# Patient Record
Sex: Female | Born: 1935
Health system: Southern US, Community
[De-identification: ages and names within clinical notes are randomized; demographics above are authoritative.]

## PROBLEM LIST (undated history)

## (undated) DIAGNOSIS — I10 Essential (primary) hypertension: Secondary | ICD-10-CM

## (undated) DIAGNOSIS — Z8489 Family history of other specified conditions: Secondary | ICD-10-CM

## (undated) DIAGNOSIS — M1990 Unspecified osteoarthritis, unspecified site: Secondary | ICD-10-CM

## (undated) DIAGNOSIS — T7840XA Allergy, unspecified, initial encounter: Secondary | ICD-10-CM

## (undated) DIAGNOSIS — F419 Anxiety disorder, unspecified: Secondary | ICD-10-CM

## (undated) DIAGNOSIS — E039 Hypothyroidism, unspecified: Secondary | ICD-10-CM

## (undated) DIAGNOSIS — Z9889 Other specified postprocedural states: Secondary | ICD-10-CM

## (undated) DIAGNOSIS — E079 Disorder of thyroid, unspecified: Secondary | ICD-10-CM

## (undated) DIAGNOSIS — R112 Nausea with vomiting, unspecified: Secondary | ICD-10-CM

## (undated) DIAGNOSIS — K219 Gastro-esophageal reflux disease without esophagitis: Secondary | ICD-10-CM

## (undated) DIAGNOSIS — Z87898 Personal history of other specified conditions: Secondary | ICD-10-CM

## (undated) DIAGNOSIS — L0390 Cellulitis, unspecified: Secondary | ICD-10-CM

## (undated) DIAGNOSIS — E785 Hyperlipidemia, unspecified: Secondary | ICD-10-CM

## (undated) HISTORY — PX: CATARACT EXTRACTION W/ INTRAOCULAR LENS  IMPLANT, BILATERAL: SHX1307

## (undated) HISTORY — DX: Cellulitis, unspecified: L03.90

## (undated) HISTORY — DX: Gastro-esophageal reflux disease without esophagitis: K21.9

## (undated) HISTORY — PX: THYROIDECTOMY, PARTIAL: SHX18

## (undated) HISTORY — DX: Anxiety disorder, unspecified: F41.9

## (undated) HISTORY — DX: Hyperlipidemia, unspecified: E78.5

## (undated) HISTORY — DX: Unspecified osteoarthritis, unspecified site: M19.90

## (undated) HISTORY — PX: OTHER SURGICAL HISTORY: SHX169

## (undated) HISTORY — PX: TONSILLECTOMY AND ADENOIDECTOMY: SUR1326

## (undated) HISTORY — DX: Essential (primary) hypertension: I10

## (undated) HISTORY — DX: Allergy, unspecified, initial encounter: T78.40XA

## (undated) HISTORY — PX: JOINT REPLACEMENT: SHX530

## (undated) HISTORY — PX: SPINE SURGERY: SHX786

## (undated) HISTORY — DX: Disorder of thyroid, unspecified: E07.9

## (undated) HISTORY — PX: COLONOSCOPY: SHX174

---

## 1987-12-10 HISTORY — PX: OTHER SURGICAL HISTORY: SHX169

## 1998-10-14 ENCOUNTER — Emergency Department (HOSPITAL_COMMUNITY): Admission: EM | Admit: 1998-10-14 | Discharge: 1998-10-14 | Payer: Self-pay | Admitting: Internal Medicine

## 2000-02-11 ENCOUNTER — Encounter: Payer: Self-pay | Admitting: Specialist

## 2000-02-18 ENCOUNTER — Inpatient Hospital Stay (HOSPITAL_COMMUNITY): Admission: RE | Admit: 2000-02-18 | Discharge: 2000-02-25 | Payer: Self-pay | Admitting: Specialist

## 2000-02-18 ENCOUNTER — Encounter: Payer: Self-pay | Admitting: Specialist

## 2000-02-22 ENCOUNTER — Encounter: Payer: Self-pay | Admitting: Specialist

## 2000-05-28 ENCOUNTER — Encounter: Admission: RE | Admit: 2000-05-28 | Discharge: 2000-08-26 | Payer: Self-pay | Admitting: Psychiatry

## 2004-10-23 ENCOUNTER — Ambulatory Visit: Payer: Self-pay | Admitting: Internal Medicine

## 2005-01-30 ENCOUNTER — Ambulatory Visit: Payer: Self-pay | Admitting: Internal Medicine

## 2005-08-12 ENCOUNTER — Emergency Department (HOSPITAL_COMMUNITY): Admission: EM | Admit: 2005-08-12 | Discharge: 2005-08-12 | Payer: Self-pay | Admitting: Emergency Medicine

## 2005-08-13 ENCOUNTER — Encounter: Admission: RE | Admit: 2005-08-13 | Discharge: 2005-08-13 | Payer: Self-pay | Admitting: Internal Medicine

## 2005-08-13 ENCOUNTER — Ambulatory Visit: Payer: Self-pay | Admitting: Internal Medicine

## 2005-08-15 ENCOUNTER — Encounter: Admission: RE | Admit: 2005-08-15 | Discharge: 2005-08-15 | Payer: Self-pay | Admitting: Internal Medicine

## 2005-08-15 ENCOUNTER — Ambulatory Visit: Payer: Self-pay | Admitting: Internal Medicine

## 2005-09-20 ENCOUNTER — Ambulatory Visit: Payer: Self-pay | Admitting: Internal Medicine

## 2005-10-23 ENCOUNTER — Ambulatory Visit: Payer: Self-pay | Admitting: Internal Medicine

## 2005-11-13 ENCOUNTER — Ambulatory Visit: Payer: Self-pay | Admitting: Internal Medicine

## 2006-01-08 ENCOUNTER — Ambulatory Visit: Payer: Self-pay | Admitting: Internal Medicine

## 2006-04-22 ENCOUNTER — Ambulatory Visit: Payer: Self-pay | Admitting: Internal Medicine

## 2006-05-08 ENCOUNTER — Ambulatory Visit: Payer: Self-pay | Admitting: Internal Medicine

## 2006-05-12 ENCOUNTER — Encounter: Admission: RE | Admit: 2006-05-12 | Discharge: 2006-05-12 | Payer: Self-pay | Admitting: Internal Medicine

## 2006-05-21 ENCOUNTER — Encounter: Admission: RE | Admit: 2006-05-21 | Discharge: 2006-05-21 | Payer: Self-pay | Admitting: Internal Medicine

## 2006-05-21 ENCOUNTER — Other Ambulatory Visit: Admission: RE | Admit: 2006-05-21 | Discharge: 2006-05-21 | Payer: Self-pay | Admitting: Interventional Radiology

## 2006-05-21 ENCOUNTER — Encounter (INDEPENDENT_AMBULATORY_CARE_PROVIDER_SITE_OTHER): Payer: Self-pay | Admitting: Specialist

## 2006-06-28 LAB — HM COLONOSCOPY: HM Colonoscopy: NORMAL

## 2006-08-18 ENCOUNTER — Observation Stay (HOSPITAL_COMMUNITY): Admission: RE | Admit: 2006-08-18 | Discharge: 2006-08-19 | Payer: Self-pay | Admitting: Surgery

## 2006-08-18 ENCOUNTER — Encounter (INDEPENDENT_AMBULATORY_CARE_PROVIDER_SITE_OTHER): Payer: Self-pay | Admitting: Specialist

## 2006-09-08 ENCOUNTER — Ambulatory Visit: Payer: Self-pay | Admitting: Internal Medicine

## 2006-10-02 ENCOUNTER — Ambulatory Visit: Payer: Self-pay | Admitting: Internal Medicine

## 2006-10-10 ENCOUNTER — Ambulatory Visit: Payer: Self-pay | Admitting: Internal Medicine

## 2006-10-10 LAB — CONVERTED CEMR LAB: Hgb A1c MFr Bld: 5.1 % (ref 4.6–6.0)

## 2006-10-28 ENCOUNTER — Ambulatory Visit: Payer: Self-pay | Admitting: Internal Medicine

## 2007-01-13 ENCOUNTER — Ambulatory Visit: Payer: Self-pay | Admitting: Internal Medicine

## 2007-01-13 LAB — CONVERTED CEMR LAB
ALT: 19 units/L (ref 0–40)
AST: 19 units/L (ref 0–37)
Cholesterol: 163 mg/dL (ref 0–200)
HDL: 37.4 mg/dL — ABNORMAL LOW (ref >39.0)
LDL Cholesterol: 89 mg/dL (ref 0–99)
TSH: 1.5 microintl units/mL (ref 0.35–5.50)
Total CHOL/HDL Ratio: 4.4
Triglycerides: 181 mg/dL — ABNORMAL HIGH (ref 0–149)
VLDL: 36 mg/dL (ref 0–40)

## 2007-01-20 ENCOUNTER — Ambulatory Visit: Payer: Self-pay | Admitting: Internal Medicine

## 2007-01-26 ENCOUNTER — Encounter: Admission: RE | Admit: 2007-01-26 | Discharge: 2007-01-26 | Payer: Self-pay | Admitting: Internal Medicine

## 2007-01-30 ENCOUNTER — Encounter: Payer: Self-pay | Admitting: Internal Medicine

## 2007-02-23 DIAGNOSIS — Z9089 Acquired absence of other organs: Secondary | ICD-10-CM | POA: Insufficient documentation

## 2007-02-23 DIAGNOSIS — E063 Autoimmune thyroiditis: Secondary | ICD-10-CM | POA: Insufficient documentation

## 2007-02-23 DIAGNOSIS — M169 Osteoarthritis of hip, unspecified: Secondary | ICD-10-CM | POA: Insufficient documentation

## 2007-03-10 ENCOUNTER — Encounter (HOSPITAL_COMMUNITY): Admission: RE | Admit: 2007-03-10 | Discharge: 2007-04-09 | Payer: Self-pay | Admitting: Neurosurgery

## 2007-04-06 ENCOUNTER — Encounter: Payer: Self-pay | Admitting: Internal Medicine

## 2007-04-24 ENCOUNTER — Inpatient Hospital Stay (HOSPITAL_COMMUNITY): Admission: RE | Admit: 2007-04-24 | Discharge: 2007-04-25 | Payer: Self-pay | Admitting: Neurosurgery

## 2007-05-13 ENCOUNTER — Encounter: Payer: Self-pay | Admitting: Internal Medicine

## 2007-08-05 ENCOUNTER — Telehealth (INDEPENDENT_AMBULATORY_CARE_PROVIDER_SITE_OTHER): Payer: Self-pay | Admitting: *Deleted

## 2007-08-18 ENCOUNTER — Ambulatory Visit: Payer: Self-pay | Admitting: Internal Medicine

## 2007-08-18 DIAGNOSIS — T7841XA Arthus phenomenon, initial encounter: Secondary | ICD-10-CM | POA: Insufficient documentation

## 2007-08-20 LAB — CONVERTED CEMR LAB
ALT: 17 units/L (ref 0–35)
AST: 19 units/L (ref 0–37)
Cholesterol: 164 mg/dL (ref 0–200)
HDL: 35.7 mg/dL — ABNORMAL LOW (ref >39.0)
Hgb A1c MFr Bld: 5.3 % (ref 4.6–6.0)
LDL Cholesterol: 92 mg/dL (ref 0–99)
Total CHOL/HDL Ratio: 4.6
Triglycerides: 182 mg/dL — ABNORMAL HIGH (ref 0–149)
VLDL: 36 mg/dL (ref 0–40)

## 2007-09-08 ENCOUNTER — Ambulatory Visit: Payer: Self-pay | Admitting: Internal Medicine

## 2007-09-08 DIAGNOSIS — E039 Hypothyroidism, unspecified: Secondary | ICD-10-CM | POA: Insufficient documentation

## 2007-09-08 DIAGNOSIS — E782 Mixed hyperlipidemia: Secondary | ICD-10-CM | POA: Insufficient documentation

## 2007-09-08 DIAGNOSIS — G2581 Restless legs syndrome: Secondary | ICD-10-CM | POA: Insufficient documentation

## 2007-09-08 LAB — CONVERTED CEMR LAB
Cholesterol, target level: 200 mg/dL
HDL goal, serum: 40 mg/dL
LDL Goal: 130 mg/dL

## 2007-09-13 LAB — CONVERTED CEMR LAB
Basophils Absolute: 0.1 10*3/uL (ref 0.0–0.1)
Basophils Relative: 0.9 % (ref 0.0–1.0)
Eosinophils Absolute: 0.3 10*3/uL (ref 0.0–0.6)
Eosinophils Relative: 4.1 % (ref 0.0–5.0)
Folate: 16.9 ng/mL
HCT: 36.6 % (ref 36.0–46.0)
Hemoglobin: 13.2 g/dL (ref 12.0–15.0)
Iron: 65 ug/dL (ref 42–145)
Lymphocytes Relative: 17.2 % (ref 12.0–46.0)
MCHC: 36 g/dL (ref 30.0–36.0)
MCV: 89 fL (ref 78.0–100.0)
Monocytes Absolute: 0.2 10*3/uL (ref 0.2–0.7)
Monocytes Relative: 2.8 % — ABNORMAL LOW (ref 3.0–11.0)
Neutro Abs: 4.6 10*3/uL (ref 1.4–7.7)
Neutrophils Relative %: 75 % (ref 43.0–77.0)
Platelets: 239 10*3/uL (ref 150–400)
RBC: 4.11 M/uL (ref 3.87–5.11)
RDW: 13.5 % (ref 11.5–14.6)
TSH: 0.67 microintl units/mL (ref 0.35–5.50)
Transferrin: 219.9 mg/dL (ref >=212.0)
Vitamin B-12: 203 pg/mL — ABNORMAL LOW (ref 211–911)
WBC: 6.3 10*3/uL (ref 4.5–10.5)

## 2007-09-14 ENCOUNTER — Encounter (INDEPENDENT_AMBULATORY_CARE_PROVIDER_SITE_OTHER): Payer: Self-pay | Admitting: *Deleted

## 2007-09-21 ENCOUNTER — Ambulatory Visit: Payer: Self-pay | Admitting: Internal Medicine

## 2007-09-23 ENCOUNTER — Telehealth: Payer: Self-pay | Admitting: Internal Medicine

## 2007-09-28 ENCOUNTER — Ambulatory Visit: Payer: Self-pay | Admitting: Internal Medicine

## 2007-10-05 ENCOUNTER — Ambulatory Visit: Payer: Self-pay | Admitting: Internal Medicine

## 2007-10-05 DIAGNOSIS — D649 Anemia, unspecified: Secondary | ICD-10-CM | POA: Insufficient documentation

## 2007-10-12 ENCOUNTER — Ambulatory Visit: Payer: Self-pay | Admitting: Internal Medicine

## 2007-11-10 ENCOUNTER — Ambulatory Visit: Payer: Self-pay | Admitting: Internal Medicine

## 2007-12-17 ENCOUNTER — Ambulatory Visit: Payer: Self-pay | Admitting: Internal Medicine

## 2007-12-23 ENCOUNTER — Telehealth (INDEPENDENT_AMBULATORY_CARE_PROVIDER_SITE_OTHER): Payer: Self-pay | Admitting: *Deleted

## 2007-12-29 ENCOUNTER — Telehealth (INDEPENDENT_AMBULATORY_CARE_PROVIDER_SITE_OTHER): Payer: Self-pay | Admitting: *Deleted

## 2008-01-20 ENCOUNTER — Telehealth (INDEPENDENT_AMBULATORY_CARE_PROVIDER_SITE_OTHER): Payer: Self-pay | Admitting: *Deleted

## 2008-01-21 ENCOUNTER — Ambulatory Visit: Payer: Self-pay | Admitting: Internal Medicine

## 2008-01-21 DIAGNOSIS — M25519 Pain in unspecified shoulder: Secondary | ICD-10-CM | POA: Insufficient documentation

## 2008-01-21 DIAGNOSIS — L989 Disorder of the skin and subcutaneous tissue, unspecified: Secondary | ICD-10-CM | POA: Insufficient documentation

## 2008-01-25 ENCOUNTER — Encounter (INDEPENDENT_AMBULATORY_CARE_PROVIDER_SITE_OTHER): Payer: Self-pay | Admitting: *Deleted

## 2008-01-28 ENCOUNTER — Encounter: Payer: Self-pay | Admitting: Internal Medicine

## 2008-02-11 ENCOUNTER — Encounter: Payer: Self-pay | Admitting: Internal Medicine

## 2008-02-16 ENCOUNTER — Encounter: Payer: Self-pay | Admitting: Internal Medicine

## 2008-02-16 ENCOUNTER — Encounter (HOSPITAL_COMMUNITY): Admission: RE | Admit: 2008-02-16 | Discharge: 2008-03-17 | Payer: Self-pay | Admitting: Orthopedic Surgery

## 2008-03-08 ENCOUNTER — Encounter: Payer: Self-pay | Admitting: Internal Medicine

## 2008-03-10 ENCOUNTER — Ambulatory Visit: Payer: Self-pay | Admitting: Internal Medicine

## 2008-03-10 LAB — CONVERTED CEMR LAB
ALT: 15 units/L (ref 0–35)
AST: 16 units/L (ref 0–37)
Albumin: 3.5 g/dL (ref 3.5–5.2)
Alkaline Phosphatase: 62 units/L (ref 39–117)
Bilirubin, Direct: 0.1 mg/dL (ref 0.0–0.3)
Cholesterol: 165 mg/dL (ref 0–200)
Folate: 17.8 ng/mL
HDL: 39.9 mg/dL (ref >39.0)
Hgb A1c MFr Bld: 5.4 % (ref 4.6–6.0)
LDL Cholesterol: 98 mg/dL (ref 0–99)
TSH: 0.62 microintl units/mL (ref 0.35–5.50)
Total Bilirubin: 0.8 mg/dL (ref 0.3–1.2)
Total CHOL/HDL Ratio: 4.1
Total Protein: 6.8 g/dL (ref 6.0–8.3)
Triglycerides: 136 mg/dL (ref 0–149)
VLDL: 27 mg/dL (ref 0–40)
Vitamin B-12: 696 pg/mL (ref 211–911)

## 2008-03-23 ENCOUNTER — Encounter (HOSPITAL_COMMUNITY): Admission: RE | Admit: 2008-03-23 | Discharge: 2008-04-22 | Payer: Self-pay | Admitting: Orthopedic Surgery

## 2008-03-28 ENCOUNTER — Ambulatory Visit: Payer: Self-pay | Admitting: Internal Medicine

## 2008-03-28 DIAGNOSIS — D510 Vitamin B12 deficiency anemia due to intrinsic factor deficiency: Secondary | ICD-10-CM | POA: Insufficient documentation

## 2008-04-13 ENCOUNTER — Telehealth (INDEPENDENT_AMBULATORY_CARE_PROVIDER_SITE_OTHER): Payer: Self-pay | Admitting: *Deleted

## 2008-04-14 ENCOUNTER — Encounter: Admission: RE | Admit: 2008-04-14 | Discharge: 2008-04-14 | Payer: Self-pay | Admitting: Orthopedic Surgery

## 2008-05-06 ENCOUNTER — Encounter: Admission: RE | Admit: 2008-05-06 | Discharge: 2008-05-06 | Payer: Self-pay | Admitting: Orthopedic Surgery

## 2008-05-30 ENCOUNTER — Ambulatory Visit: Payer: Self-pay | Admitting: Internal Medicine

## 2008-05-30 DIAGNOSIS — T887XXA Unspecified adverse effect of drug or medicament, initial encounter: Secondary | ICD-10-CM | POA: Insufficient documentation

## 2008-06-01 ENCOUNTER — Ambulatory Visit: Payer: Self-pay | Admitting: Internal Medicine

## 2008-06-04 LAB — CONVERTED CEMR LAB
Creatinine,U: 94.8 mg/dL
Hgb A1c MFr Bld: 5.5 % (ref 4.6–6.0)
Microalb Creat Ratio: 7.4 mg/g (ref 0.0–30.0)
Microalb, Ur: 0.7 mg/dL (ref 0.0–1.9)

## 2008-08-17 ENCOUNTER — Encounter: Payer: Self-pay | Admitting: Internal Medicine

## 2008-09-28 ENCOUNTER — Ambulatory Visit: Payer: Self-pay | Admitting: Internal Medicine

## 2008-09-28 DIAGNOSIS — IMO0001 Reserved for inherently not codable concepts without codable children: Secondary | ICD-10-CM | POA: Insufficient documentation

## 2008-09-28 DIAGNOSIS — M48061 Spinal stenosis, lumbar region without neurogenic claudication: Secondary | ICD-10-CM | POA: Insufficient documentation

## 2008-09-28 DIAGNOSIS — I10 Essential (primary) hypertension: Secondary | ICD-10-CM | POA: Insufficient documentation

## 2008-09-30 ENCOUNTER — Encounter (INDEPENDENT_AMBULATORY_CARE_PROVIDER_SITE_OTHER): Payer: Self-pay | Admitting: *Deleted

## 2008-09-30 LAB — CONVERTED CEMR LAB
Basophils Absolute: 0.1 10*3/uL (ref 0.0–0.1)
Basophils Relative: 1.2 % (ref 0.0–3.0)
Eosinophils Absolute: 0.2 10*3/uL (ref 0.0–0.7)
Eosinophils Relative: 3.9 % (ref 0.0–5.0)
Folate: >20 ng/mL
HCT: 39 % (ref 36.0–46.0)
Hemoglobin: 13.3 g/dL (ref 12.0–15.0)
Iron: 50 ug/dL (ref 42–145)
Lymphocytes Relative: 26.5 % (ref 12.0–46.0)
MCHC: 34 g/dL (ref 30.0–36.0)
MCV: 92.8 fL (ref 78.0–100.0)
Monocytes Absolute: 0.4 10*3/uL (ref 0.1–1.0)
Monocytes Relative: 6.4 % (ref 3.0–12.0)
Neutro Abs: 3.4 10*3/uL (ref 1.4–7.7)
Neutrophils Relative %: 62 % (ref 43.0–77.0)
Platelets: 217 10*3/uL (ref 150–400)
RBC: 4.2 M/uL (ref 3.87–5.11)
RDW: 13.5 % (ref 11.5–14.6)
Saturation Ratios: 16.8 % — ABNORMAL LOW (ref 20.0–50.0)
Total CK: 77 units/L (ref 7–177)
Transferrin: 212.8 mg/dL (ref >=212.0)
Vitamin B-12: >1500 pg/mL — ABNORMAL HIGH (ref 211–911)
WBC: 5.6 10*3/uL (ref 4.5–10.5)

## 2009-03-24 ENCOUNTER — Telehealth (INDEPENDENT_AMBULATORY_CARE_PROVIDER_SITE_OTHER): Payer: Self-pay | Admitting: *Deleted

## 2009-03-30 ENCOUNTER — Telehealth (INDEPENDENT_AMBULATORY_CARE_PROVIDER_SITE_OTHER): Payer: Self-pay | Admitting: *Deleted

## 2009-04-19 ENCOUNTER — Ambulatory Visit: Payer: Self-pay | Admitting: Internal Medicine

## 2009-05-01 ENCOUNTER — Telehealth (INDEPENDENT_AMBULATORY_CARE_PROVIDER_SITE_OTHER): Payer: Self-pay | Admitting: *Deleted

## 2009-05-04 ENCOUNTER — Encounter (INDEPENDENT_AMBULATORY_CARE_PROVIDER_SITE_OTHER): Payer: Self-pay | Admitting: *Deleted

## 2009-05-04 LAB — CONVERTED CEMR LAB
ALT: 18 units/L (ref 0–35)
AST: 20 units/L (ref 0–37)
Albumin: 3.5 g/dL (ref 3.5–5.2)
Alkaline Phosphatase: 69 units/L (ref 39–117)
Bilirubin, Direct: 0 mg/dL (ref 0.0–0.3)
Cholesterol: 154 mg/dL (ref 0–200)
HDL: 34.7 mg/dL — ABNORMAL LOW (ref >39.00)
Hgb A1c MFr Bld: 5.4 % (ref 4.6–6.5)
LDL Cholesterol: 84 mg/dL (ref 0–99)
Total Bilirubin: 0.6 mg/dL (ref 0.3–1.2)
Total CHOL/HDL Ratio: 4
Total Protein: 6.5 g/dL (ref 6.0–8.3)
Triglycerides: 175 mg/dL — ABNORMAL HIGH (ref 0.0–149.0)
VLDL: 35 mg/dL (ref 0.0–40.0)

## 2009-05-15 ENCOUNTER — Ambulatory Visit: Payer: Self-pay | Admitting: Internal Medicine

## 2009-05-15 DIAGNOSIS — H8110 Benign paroxysmal vertigo, unspecified ear: Secondary | ICD-10-CM | POA: Insufficient documentation

## 2009-05-18 LAB — CONVERTED CEMR LAB
Folate: >20 ng/mL
TSH: 1.01 microintl units/mL (ref 0.35–5.50)
Vitamin B-12: 586 pg/mL (ref 211–911)

## 2009-06-13 ENCOUNTER — Encounter: Admission: RE | Admit: 2009-06-13 | Discharge: 2009-07-14 | Payer: Self-pay | Admitting: Internal Medicine

## 2009-06-21 ENCOUNTER — Telehealth (INDEPENDENT_AMBULATORY_CARE_PROVIDER_SITE_OTHER): Payer: Self-pay | Admitting: *Deleted

## 2009-07-13 ENCOUNTER — Encounter: Payer: Self-pay | Admitting: Internal Medicine

## 2009-11-15 ENCOUNTER — Ambulatory Visit: Payer: Self-pay | Admitting: Internal Medicine

## 2009-11-20 LAB — CONVERTED CEMR LAB
Cholesterol: 151 mg/dL (ref 0–200)
HDL: 35.5 mg/dL — ABNORMAL LOW (ref >39.00)
LDL Cholesterol: 87 mg/dL (ref 0–99)
Total CHOL/HDL Ratio: 4
Triglycerides: 141 mg/dL (ref 0.0–149.0)
VLDL: 28.2 mg/dL (ref 0.0–40.0)

## 2009-11-22 ENCOUNTER — Ambulatory Visit: Payer: Self-pay | Admitting: Internal Medicine

## 2009-11-22 DIAGNOSIS — M1990 Unspecified osteoarthritis, unspecified site: Secondary | ICD-10-CM | POA: Insufficient documentation

## 2010-02-09 ENCOUNTER — Ambulatory Visit: Payer: Self-pay | Admitting: Internal Medicine

## 2010-02-09 DIAGNOSIS — R079 Chest pain, unspecified: Secondary | ICD-10-CM | POA: Insufficient documentation

## 2010-02-09 DIAGNOSIS — R1310 Dysphagia, unspecified: Secondary | ICD-10-CM | POA: Insufficient documentation

## 2010-02-13 ENCOUNTER — Encounter (INDEPENDENT_AMBULATORY_CARE_PROVIDER_SITE_OTHER): Payer: Self-pay | Admitting: *Deleted

## 2010-02-21 ENCOUNTER — Ambulatory Visit: Payer: Self-pay | Admitting: Gastroenterology

## 2010-02-21 DIAGNOSIS — K222 Esophageal obstruction: Secondary | ICD-10-CM | POA: Insufficient documentation

## 2010-03-01 ENCOUNTER — Ambulatory Visit: Payer: Self-pay | Admitting: Gastroenterology

## 2010-03-05 ENCOUNTER — Encounter (INDEPENDENT_AMBULATORY_CARE_PROVIDER_SITE_OTHER): Payer: Self-pay | Admitting: *Deleted

## 2010-03-05 ENCOUNTER — Telehealth: Payer: Self-pay | Admitting: Gastroenterology

## 2010-03-06 ENCOUNTER — Encounter: Payer: Self-pay | Admitting: Gastroenterology

## 2010-04-09 ENCOUNTER — Ambulatory Visit: Payer: Self-pay | Admitting: Gastroenterology

## 2010-04-09 DIAGNOSIS — K219 Gastro-esophageal reflux disease without esophagitis: Secondary | ICD-10-CM | POA: Insufficient documentation

## 2010-04-11 ENCOUNTER — Telehealth: Payer: Self-pay | Admitting: Gastroenterology

## 2010-04-11 ENCOUNTER — Ambulatory Visit: Payer: Self-pay | Admitting: Internal Medicine

## 2010-04-17 ENCOUNTER — Ambulatory Visit: Payer: Self-pay | Admitting: Gastroenterology

## 2010-05-03 ENCOUNTER — Telehealth: Payer: Self-pay | Admitting: Gastroenterology

## 2010-05-08 LAB — CONVERTED CEMR LAB
ALT: 19 units/L (ref 0–35)
AST: 25 units/L (ref 0–37)
Albumin: 3.7 g/dL (ref 3.5–5.2)
Alkaline Phosphatase: 74 units/L (ref 39–117)
BUN: 12 mg/dL (ref 6–23)
Basophils Absolute: 0 10*3/uL (ref 0.0–0.1)
Basophils Relative: 0.7 % (ref 0.0–3.0)
Bilirubin, Direct: 0.1 mg/dL (ref 0.0–0.3)
Cholesterol: 134 mg/dL (ref 0–200)
Creatinine, Ser: 0.7 mg/dL (ref 0.4–1.2)
Eosinophils Absolute: 0.2 10*3/uL (ref 0.0–0.7)
Eosinophils Relative: 4.1 % (ref 0.0–5.0)
HCT: 36.7 % (ref 36.0–46.0)
HDL: 31.8 mg/dL — ABNORMAL LOW (ref >39.00)
Hemoglobin: 12.6 g/dL (ref 12.0–15.0)
Hgb A1c MFr Bld: 5.3 % (ref 4.6–6.5)
LDL Cholesterol: 70 mg/dL (ref 0–99)
Lymphocytes Relative: 23.3 % (ref 12.0–46.0)
Lymphs Abs: 1.2 10*3/uL (ref 0.7–4.0)
MCHC: 34.4 g/dL (ref 30.0–36.0)
MCV: 91.8 fL (ref 78.0–100.0)
Monocytes Absolute: 0.4 10*3/uL (ref 0.1–1.0)
Monocytes Relative: 8.8 % (ref 3.0–12.0)
Neutro Abs: 3.2 10*3/uL (ref 1.4–7.7)
Neutrophils Relative %: 63.1 % (ref 43.0–77.0)
Platelets: 219 10*3/uL (ref 150.0–400.0)
Potassium: 4.1 meq/L (ref 3.5–5.1)
RBC: 3.99 M/uL (ref 3.87–5.11)
RDW: 14.8 % — ABNORMAL HIGH (ref 11.5–14.6)
TSH: 1.86 microintl units/mL (ref 0.35–5.50)
Total Bilirubin: 0.4 mg/dL (ref 0.3–1.2)
Total CHOL/HDL Ratio: 4
Total Protein: 6.2 g/dL (ref 6.0–8.3)
Triglycerides: 163 mg/dL — ABNORMAL HIGH (ref 0.0–149.0)
VLDL: 32.6 mg/dL (ref 0.0–40.0)
WBC: 5 10*3/uL (ref 4.5–10.5)

## 2010-07-16 ENCOUNTER — Telehealth: Payer: Self-pay | Admitting: Gastroenterology

## 2010-07-16 ENCOUNTER — Ambulatory Visit: Payer: Self-pay | Admitting: Internal Medicine

## 2010-07-16 ENCOUNTER — Encounter (INDEPENDENT_AMBULATORY_CARE_PROVIDER_SITE_OTHER): Payer: Self-pay | Admitting: *Deleted

## 2010-07-16 DIAGNOSIS — R29818 Other symptoms and signs involving the nervous system: Secondary | ICD-10-CM | POA: Insufficient documentation

## 2010-07-16 DIAGNOSIS — R110 Nausea: Secondary | ICD-10-CM | POA: Insufficient documentation

## 2010-07-16 DIAGNOSIS — R109 Unspecified abdominal pain: Secondary | ICD-10-CM | POA: Insufficient documentation

## 2010-07-16 DIAGNOSIS — R82998 Other abnormal findings in urine: Secondary | ICD-10-CM | POA: Insufficient documentation

## 2010-07-17 LAB — CONVERTED CEMR LAB
ALT: 14 units/L (ref 0–35)
AST: 17 units/L (ref 0–37)
Albumin: 3.9 g/dL (ref 3.5–5.2)
Alkaline Phosphatase: 63 units/L (ref 39–117)
BUN: 12 mg/dL (ref 6–23)
Basophils Absolute: 0 10*3/uL (ref 0.0–0.1)
Basophils Relative: 0.3 % (ref 0.0–3.0)
CO2: 28 meq/L (ref 19–32)
Calcium: 8.7 mg/dL (ref 8.4–10.5)
Chloride: 102 meq/L (ref 96–112)
Creatinine, Ser: 0.8 mg/dL (ref 0.4–1.2)
Eosinophils Absolute: 0 10*3/uL (ref 0.0–0.7)
Eosinophils Relative: 0.7 % (ref 0.0–5.0)
GFR calc non Af Amer: 79.4 mL/min (ref >60)
Glucose, Bld: 138 mg/dL — ABNORMAL HIGH (ref 70–99)
HCT: 37.6 % (ref 36.0–46.0)
Hemoglobin: 13.2 g/dL (ref 12.0–15.0)
Lymphocytes Relative: 14.7 % (ref 12.0–46.0)
Lymphs Abs: 1 10*3/uL (ref 0.7–4.0)
MCHC: 35.1 g/dL (ref 30.0–36.0)
MCV: 91.9 fL (ref 78.0–100.0)
Monocytes Absolute: 0.5 10*3/uL (ref 0.1–1.0)
Monocytes Relative: 7.7 % (ref 3.0–12.0)
Neutro Abs: 5.3 10*3/uL (ref 1.4–7.7)
Neutrophils Relative %: 76.6 % (ref 43.0–77.0)
Platelets: 196 10*3/uL (ref 150.0–400.0)
Potassium: 4.2 meq/L (ref 3.5–5.1)
RBC: 4.09 M/uL (ref 3.87–5.11)
RDW: 14.4 % (ref 11.5–14.6)
Sed Rate: 25 mm/hr — ABNORMAL HIGH (ref 0–22)
Sodium: 141 meq/L (ref 135–145)
Total Bilirubin: 0.9 mg/dL (ref 0.3–1.2)
Total Protein: 6.7 g/dL (ref 6.0–8.3)
WBC: 6.9 10*3/uL (ref 4.5–10.5)

## 2010-07-18 ENCOUNTER — Ambulatory Visit (HOSPITAL_BASED_OUTPATIENT_CLINIC_OR_DEPARTMENT_OTHER): Admission: RE | Admit: 2010-07-18 | Discharge: 2010-07-18 | Payer: Self-pay | Admitting: Internal Medicine

## 2010-07-18 ENCOUNTER — Ambulatory Visit: Payer: Self-pay | Admitting: Diagnostic Radiology

## 2010-07-18 ENCOUNTER — Ambulatory Visit: Payer: Self-pay | Admitting: Internal Medicine

## 2010-07-18 DIAGNOSIS — R1012 Left upper quadrant pain: Secondary | ICD-10-CM | POA: Insufficient documentation

## 2010-07-18 DIAGNOSIS — K5730 Diverticulosis of large intestine without perforation or abscess without bleeding: Secondary | ICD-10-CM | POA: Insufficient documentation

## 2010-07-18 DIAGNOSIS — K5900 Constipation, unspecified: Secondary | ICD-10-CM | POA: Insufficient documentation

## 2010-07-20 ENCOUNTER — Telehealth: Payer: Self-pay | Admitting: Internal Medicine

## 2010-07-23 ENCOUNTER — Telehealth: Payer: Self-pay | Admitting: Gastroenterology

## 2010-07-30 ENCOUNTER — Encounter: Payer: Self-pay | Admitting: Internal Medicine

## 2010-08-29 ENCOUNTER — Telehealth (INDEPENDENT_AMBULATORY_CARE_PROVIDER_SITE_OTHER): Payer: Self-pay | Admitting: *Deleted

## 2010-10-24 ENCOUNTER — Ambulatory Visit: Payer: Self-pay | Admitting: Internal Medicine

## 2010-10-29 LAB — CONVERTED CEMR LAB
ALT: 16 units/L (ref 0–35)
AST: 22 units/L (ref 0–37)
Albumin: 3.6 g/dL (ref 3.5–5.2)
Alkaline Phosphatase: 76 units/L (ref 39–117)
BUN: 14 mg/dL (ref 6–23)
Basophils Absolute: 0 10*3/uL (ref 0.0–0.1)
Basophils Relative: 0.4 % (ref 0.0–3.0)
Bilirubin, Direct: 0.1 mg/dL (ref 0.0–0.3)
CO2: 28 meq/L (ref 19–32)
Calcium: 8.9 mg/dL (ref 8.4–10.5)
Chloride: 104 meq/L (ref 96–112)
Cholesterol: 162 mg/dL (ref 0–200)
Creatinine, Ser: 0.8 mg/dL (ref 0.4–1.2)
Direct LDL: 93.7 mg/dL
Eosinophils Absolute: 0.2 10*3/uL (ref 0.0–0.7)
Eosinophils Relative: 3.4 % (ref 0.0–5.0)
GFR calc non Af Amer: 80.56 mL/min (ref >60)
Glucose, Bld: 88 mg/dL (ref 70–99)
HCT: 34.7 % — ABNORMAL LOW (ref 36.0–46.0)
HDL: 35.6 mg/dL — ABNORMAL LOW (ref >39.00)
Hemoglobin: 12.1 g/dL (ref 12.0–15.0)
Lymphocytes Relative: 21.8 % (ref 12.0–46.0)
Lymphs Abs: 1.3 10*3/uL (ref 0.7–4.0)
MCHC: 34.9 g/dL (ref 30.0–36.0)
MCV: 92.7 fL (ref 78.0–100.0)
Monocytes Absolute: 0.5 10*3/uL (ref 0.1–1.0)
Monocytes Relative: 8.5 % (ref 3.0–12.0)
Neutro Abs: 3.8 10*3/uL (ref 1.4–7.7)
Neutrophils Relative %: 65.9 % (ref 43.0–77.0)
Platelets: 265 10*3/uL (ref 150.0–400.0)
Potassium: 4.1 meq/L (ref 3.5–5.1)
RBC: 3.74 M/uL — ABNORMAL LOW (ref 3.87–5.11)
RDW: 14.2 % (ref 11.5–14.6)
Sodium: 140 meq/L (ref 135–145)
TSH: 5.58 microintl units/mL — ABNORMAL HIGH (ref 0.35–5.50)
Total Bilirubin: 0.4 mg/dL (ref 0.3–1.2)
Total CHOL/HDL Ratio: 5
Total CK: 78 units/L (ref 7–177)
Total Protein: 6.4 g/dL (ref 6.0–8.3)
Triglycerides: 234 mg/dL — ABNORMAL HIGH (ref 0.0–149.0)
VLDL: 46.8 mg/dL — ABNORMAL HIGH (ref 0.0–40.0)
WBC: 5.8 10*3/uL (ref 4.5–10.5)

## 2010-11-15 ENCOUNTER — Emergency Department (HOSPITAL_COMMUNITY): Admission: EM | Admit: 2010-11-15 | Discharge: 2010-07-16 | Payer: Self-pay | Admitting: General Surgery

## 2010-11-22 ENCOUNTER — Telehealth (INDEPENDENT_AMBULATORY_CARE_PROVIDER_SITE_OTHER): Payer: Self-pay | Admitting: *Deleted

## 2010-11-26 ENCOUNTER — Encounter: Payer: Self-pay | Admitting: Internal Medicine

## 2010-11-28 ENCOUNTER — Telehealth: Payer: Self-pay | Admitting: Internal Medicine

## 2010-12-30 ENCOUNTER — Encounter: Payer: Self-pay | Admitting: Orthopedic Surgery

## 2011-01-08 NOTE — Assessment & Plan Note (Signed)
Summary: LEG AND KNEE PAIN, CANT SLEEP//VGJ   Vital Signs:  Patient Profile:   75 Years Old Female Weight:      192.4 pounds Temp:     98.5 degrees F oral Pulse rate:   84 / minute Resp:     17 per minute BP sitting:   100 / 62  (left arm) Cuff size:   large  Pt. in pain?   yes    Location:   l inguinal area to L foot    Intensity:   8 or <    Type:       burning  Vitals Entered By: Shonna Chock (September 28, 2008 2:04 PM)                  Chief Complaint:  1.) Leg and Knee pain -ongoing concern-getting worse 2.) Problems staying asleep.  History of Present Illness: Onset > 6 months ago w/o trigger or injury. She  saw Dr Venetia Maxon in 9/09;he Dxed" arthritis".She had seen Dr Charlett Blake for this early this year. She did films & MRI which showed "arthritis". Both notes reviewed: lumbar stenosis ,DDD,&? restless leg syndrome. Epidural X2 w/o benefit. Dr Venetia Maxon stated no surgical situation. She feels she must shake legs. Marginal response of symptoms to Robinole titration Rxed by Dr Venetia Maxon.    Current Allergies (reviewed today): ! NEURONTIN ! MORPHINE     Review of Systems  General      Complains of sleep disorder.      Denies chills, fever, sweats, and weight loss.      Symptoms affect sleep  GI      No iIncontinence  GU      Denies incontinence.      She does have urgency  MS      Complains of joint pain and muscle aches.      Denies mid back pain and thoracic pain.      Minor LBP; muscle symptoms worse sitting. Knee pain & myalgias of upper arms & thighs  Derm      Denies changes in color of skin, lesion(s), and rash.  Neuro      See HPI      Symmetric weakness;no perineal paresthesias   Heme      Denies bleeding.   Physical Exam  General:     in no acute distress; alert,appropriate and cooperative throughout examination Lungs:     Normal respiratory effort, chest expands symmetrically. Lungs are : mild  no crackles Heart:     Normal rate and regular  rhythm. S1 and S2 normal without gallop, murmur, click, rub . S4 with slurring Abdomen:     Bowel sounds positive,abdomen soft and non-tender without masses, organomegaly or hernias noted.No AAA Msk:     No flank apin Pulses:     R and L dorsalis pedis and posterior tibial pulses are full and equal bilaterally Extremities:     Crepitus with fusiform changes. Neg SLR.No edema.DJD of hands. Neurologic:     strength normal in all extremities and DTRs symmetrical and normal.   Skin:     Intact without suspicious lesions or rashes Psych:     memory intact for recent and remote, normally interactive, good eye contact, not anxious appearing, and not depressed appearing.      Impression & Recommendations:  Problem # 1:  SPINAL STENOSIS, LUMBAR (ICD-724.02)  Orders: Venipuncture (16109)   Problem # 2:  MYALGIA (ICD-729.1) Meddication list for this problem includes: Ibuprofen 200 Mg  Tabs (Ibuprofen) .Marland Kitchen... 1 by mouth once daily    Arthritis Pain Relief 650 Mg Cr-tabs (Acetaminophen) .Marland Kitchen... 1 by mouth once daily  Orders: Venipuncture (81191) TLB-CK Total Only(Creatine Kinase/CPK) (82550-CK)  Her updated medication list for this problem includes:    Ibuprofen 200 Mg Tabs (Ibuprofen) .Marland Kitchen... 1 by mouth once daily    Arthritis Pain Relief 650 Mg Cr-tabs (Acetaminophen) .Marland Kitchen... 1 by mouth once daily   Problem # 3:  RESTLESS LEG SYNDROME (ICD-333.94) Note : lack of response to RLS meds to date Orders: Venipuncture (47829) TLB-CBC Platelet - w/Differential (85025-CBCD) TLB-B12 + Folate Pnl (56213_08657-Q46/NGE) TLB-IBC Pnl (Iron/FE;Transferrin) (83550-IBC)   Complete Medication List: 1)  Pravachol 40 Mg Tabs (Pravastatin sodium) .... Take 1 tablet at bedtime for cholesterol 2)  Fluoxetine Hcl 10 Mg Tabs (Fluoxetine hcl) .... Take 1 tablet at bedtime 3)  Lotemax 0.5 % Susp (Loteprednol etabonate) .... 2 drops in eye at bedtime 4)  Kls Allerclear 10 Mg Tabs (Loratadine) .... Take once  daily 5)  Bl Glucosamine-chondroitin 500-400 Mg Tabs (Glucosamine-chondroitin) 6)  Fish Oil  7)  Vit D 1000mg  I Qd  8)  Cranberry 1 Po Qd  9)  Tylenol As 2 Qd  10)  Restful Legs 1 Qhs Prn  11)  Levothroid 125 Mcg Tabs (Levothyroxine sodium) .... Take one tablet every morning 12)  Ibuprofen 200 Mg Tabs (Ibuprofen) .Marland Kitchen.. 1 by mouth once daily 13)  Allergy Relief 10 Mg Tabs (Loratadine) .Marland Kitchen.. 1 by mouth once daily 14)  Vitamin B-12 2500 Mcg Subl (Cyanocobalamin) .Marland Kitchen.. 1 by mouth once daily 15)  Multivitamins Tabs (Multiple vitamin) .Marland Kitchen.. 1 by mouth once daily 16)  Arthritis Pain Relief 650 Mg Cr-tabs (Acetaminophen) .Marland Kitchen.. 1 by mouth once daily 17)  Clonazepam 0.5 Mg Tabs (Clonazepam) .... 1/2 - 1 at bedtime   Patient Instructions: 1)  Googal spinal stenosis please.   Prescriptions: CLONAZEPAM 0.5 MG TABS (CLONAZEPAM) 1/2 - 1 at bedtime  #30 x 5   Entered and Authorized by:   Marga Melnick MD   Signed by:   Marga Melnick MD on 09/30/2008   Method used:   Print then Give to Patient   RxID:   [id number]  ]

## 2011-01-08 NOTE — Miscellaneous (Signed)
Summary: Orders Update   Clinical Lists Changes  Orders: Added new Referral order of Radiology Referral (Radiology) - Signed 

## 2011-01-08 NOTE — Assessment & Plan Note (Signed)
Summary: POST ER VISIT/LEFT FLANK PAIN,NAUSEA       (DR.KAPLAN PT.)   ...   History of Present Illness Visit Type: Follow-up Visit Primary GI MD: Melvia Heaps MD [facility] Primary Provider: Marga Melnick, MD  Requesting Provider: n/a Chief Complaint: Post ER and CT scan from today, Left sided pain that radiates to her back on her left side History of Present Illness:   Patient followed by Dr.Kaplan for diverticulosis, GERD, esophageal stricture. She presents with left sided abdominal pain which started yesterday. Developed discomfrt at [employer], couldn't get comfortable. Pain not exacerbated by movement. Started as sharp pain,  now constant burning, naggin hurt. She has consumed only clears since onset of pain and doesn't seem to affect pain.  History of kidney stones and felt like that. Called Dr. Juanda Chance who was on call and Cipro was called in for possible diverticulitis. Pain progressed, patient went to ER, CT scan without oral or IV constrast was negative. Labs not done. Has history of shingles on RLE, currently no lesions on left flank. No fevers. No dysuria or hematuria.   Patient complains of constipation. She started stool softeners yesterday.   Still having some problems swallowing pills.    GI Review of Systems    Reports abdominal pain and  nausea.     Location of  Abdominal pain: LLQ.    Denies acid reflux, belching, bloating, chest pain, dysphagia with liquids, dysphagia with solids, heartburn, loss of appetite, vomiting, vomiting blood, weight loss, and  weight gain.      Reports constipation.     Denies anal fissure, black tarry stools, change in bowel habit, diarrhea, diverticulosis, fecal incontinence, heme positive stool, hemorrhoids, irritable bowel syndrome, jaundice, light color stool, liver problems, rectal bleeding, and  rectal pain. Dyspepsia History:      There is a prior history of GERD.  A prior EGD has been done.      Current Medications (verified): 1)  Pravachol 40  Mg  Tabs (Pravastatin Sodium) .... Take 1 Tablet At Bedtime For Cholesterol 2)  Fluoxetine Hcl 10 Mg Caps (Fluoxetine Hcl) .Marland Kitchen.. 1 By Mouth At Bedtime 3)  Lotemax 0.5 %  Susp (Loteprednol Etabonate) .... 2 Drops in Eye At Bedtime 4)  Osteo Bi-Flex Triple Strength  Tabs (Misc Natural Products) .... 2 Tablet Daily 5)  Fish Oil 1200 Mg Caps (Omega-3 Fatty Acids) .Marland Kitchen.. 1 By Mouth Once Daily 6)  Vitamin D3 1000 Unit Tabs (Cholecalciferol) .Marland Kitchen.. 1 By Mouth Once Daily 7)  Cranberry 1 Po Qd 8)  Levothroid 125 Mcg  Tabs (Levothyroxine Sodium) .... Take One Tablet Every Morning 9)  Tylenol Arthritis Pain 650 Mg Cr-Tabs (Acetaminophen) .... One Tablet Two Times A Day 10)  Allergy Relief 10 Mg Tabs (Loratadine) .Marland Kitchen.. 1 By Mouth Once Daily 11)  Vitamin B-12 250 Mcg Tabs (Cyanocobalamin) .Marland Kitchen.. 1 By Mouth Once Daily 12)  Multivitamins  Tabs (Multiple Vitamin) .Marland Kitchen.. 1 By Mouth Once Daily 13)  Clonazepam 0.5 Mg Tabs (Clonazepam) .... 1/2 - 1 At Bedtime 14)  Vesicare 5 Mg Tabs (Solifenacin Succinate) .... Take One Tablet Daily 15)  Hyomax-Sl 0.125 Mg Subl (Hyoscyamine Sulfate) .... Put 1 Under Tongue Every 4 Hours As Needed For Pain. 16)  Cipro 500 Mg Tabs (Ciprofloxacin Hcl) .Marland Kitchen.. 1 By Mouth Two Times A Day 17)  Tramadol Hcl 50 Mg Tabs (Tramadol Hcl) .Marland Kitchen.. 1 By Mouth Every 4-6 Hours As Needed 18)  Omeprazole 20 Mg Cpdr (Omeprazole) .Marland Kitchen.. 1 By Mouth Two Times A Day  As Needed  Allergies (verified): 1)  ! Neurontin 2)  ! Morphine  Past History:  Past Surgical History: Last updated: 02/21/2010 thyroid surgery for cyst L-S  spine disc surgery renal calculi X1 Tonsillectomy hip surgery left hip surgery right thyroid biopsy Hashimoto's thyroiditis lobectomy09/09/2006 Cataract Surgery Bilateral   Family History: Last updated: 03/28/2008 mother diabetes drug related, cva , hepatitis father coad, chf, ?bysmnosis paternal aunt colon cancer paternal uncle colon cancer  Social History: Last updated:  02/21/2010 Retired Married Never Smoked Alcohol use-no Regular exercise-no  Past Medical History: Temp memory loss 2006 SHOULDER PAIN, LEFT (ICD-719.41) ANEMIA NOS (ICD-285.9) RESTLESS LEG SYNDROME (ICD-333.94) HYPOTHYROIDISM (ICD-244.9) HYPERLIPIDEMIA (ICD-272.2) ADVEF, DRUG/MED/BIOL SUBST, ARTHUS PHENOMENON (ICD-995.21) HASHIMOTO'S THYROIDITIS (ICD-245.2) DEGENERATIVE JOINT DISEASE, HIPS (ICD-715.95) DIVERTICULOSIS  Review of Systems       The patient complains of fatigue and sleeping problems.  The patient denies allergy/sinus, anemia, anxiety-new, arthritis/joint pain, back pain, blood in urine, breast changes/lumps, change in vision, confusion, cough, coughing up blood, depression-new, fainting, fever, headaches-new, hearing problems, heart murmur, heart rhythm changes, itching, menstrual pain, muscle pains/cramps, night sweats, nosebleeds, pregnancy symptoms, shortness of breath, skin rash, sore throat, swelling of feet/legs, swollen lymph glands, thirst - excessive , urination - excessive , urination changes/pain, urine leakage, vision changes, and voice change.    Vital Signs:  Patient profile:   75 year old female Height:      66 inches Weight:      185 pounds BSA:     1.94 Pulse rate:   74 / minute Pulse rhythm:   regular BP sitting:   132 / 68  (left arm)  Vitals Entered By: Merri Ray CMA Duncan Dull) (July 16, 2010 10:58 AM)  Physical Exam  General:  Well developed, well nourished, no acute distress. Head:  Normocephalic and atraumatic. Eyes:  Conjunctiva pink, no icterus.  Neck:  no obvious masses  Lungs:  A few crackles in bilateral lower lobes Heart:  Regular rate and rhythm; no murmurs, rubs,  or bruits. Abdomen:  Abdomen soft, nontender, nondistended. No obvious masses or hepatomegaly.Normal bowel sounds.  Msk:  Symmetrical with no gross deformities. Normal posture. Moderate tenderness left front ribcage extending around left flank area. Pain  reproduced 2-3 times with palpation of left rib cage Extremities:  No palmar erythema, no edema.  Neurologic:  Alert and  oriented x4;  grossly normal neurologically. Skin:  Intact without significant lesions or rashes. Cervical Nodes:  No significant cervical adenopathy. Psych:  Alert and cooperative. Normal mood and affect.   Impression & Recommendations:  Problem # 1:  MUSCULOSKELETAL PAIN (ICD-781.99) Assessment New Started empirical treatment for diverticulitis over weekend. Pain progressed, went to ER and diagnosed with left flank pain. Urinalysis revealed WBC'S 3-6 per hpf and RBCs 0-2 per hpf, rare bacteria. Patient's left front and left lateral rib cage very tender to palpation. No abdominal tenderness. No evidence to suggest gastrointestinal source of pain. Will try Celebrex, obtain labs since none done in ER. Take daily Omeprazole while on Celebrex (she has been taking only as needed). Zofran for nausea (see #2).   Problem # 2:  BENIGN PAROXYSMAL POSITIONAL VERTIGO (ICD-386.11) Assessment: Comment Only Dizziness with nausea / vomiting in examination room. Orthostatic vitals obtained and no significant drop in systolic or diastolic blood pressures . Her vertigo is chronic, she takes Antivert.   Problem # 3:  URINALYSIS, ABNORMAL (ICD-791.9) Assessment: Comment Only A few WBCs and RBCs in urine. UTI obviously not source of pain but advised  her to continue Cipro in case she does have urinary infection.   Problem # 4:  SPINAL STENOSIS, LUMBAR (ICD-724.02) Assessment: Comment Only  Other Orders: TLB-CBC Platelet - w/Differential (85025-CBCD) TLB-CMP (Comprehensive Metabolic Pnl) (80053-COMP) TLB-Sedimentation Rate (ESR) (85652-ESR)   Patient Instructions: 1)  We sent prescriptions for Generic Zofran and Celebrex. 2)  Take the Omeprazole daily while taking the Celebrex to protect your stomach. 3)  Continue the Cipro. 4)  Copy sent to : Marga Melnick, MD 5)  The medication  list was reviewed and reconciled.  All changed / newly prescribed medications were explained.  A complete medication list was provided to the patient / caregiver. Prescriptions: CELEBREX 50 MG CAPS (CELECOXIB) Take 1 tab daily  #30 x 0   Entered by:   Lowry Ram NCMA   Authorized by:   Willette Cluster NP   Signed by:   Lowry Ram NCMA on 07/16/2010   Method used:   Electronically to        [facility]  [address] (retail)       [address]       [place], [place]  [address]       Ph: [phone]       Fax: [phone]   RxID:   [id number] ONDANSETRON HCL 4 MG TABS (ONDANSETRON HCL) Take 1 tab every 6 hours as needed for nausea  #40 x 0   Entered by:   Lowry Ram NCMA   Authorized by:   Willette Cluster NP   Signed by:   Lowry Ram NCMA on 07/16/2010   Method used:   Electronically to        [facility]  [address] (retail)       [address]       [place], [place]  [address]       Ph: [phone]       Fax: [phone]   RxID:   [id number]

## 2011-01-08 NOTE — Consult Note (Signed)
Summary: [facility]  [facility]   Imported By: Lanelle Bal 03/01/2010 12:10:39  _____________________________________________________________________  External Attachment:    Type:   Image     Comment:   External Document

## 2011-01-08 NOTE — Assessment & Plan Note (Signed)
Summary: B12 SHOT//CA  Nurse Visit    Prior Medications: PRAVACHOL 40 MG  TABS (PRAVASTATIN SODIUM) TAKE 1 TABLET AT BEDTIME FOR CHOLESTEROL FLUOXETINE HCL 10 MG  TABS (FLUOXETINE HCL) Take 1 tablet at bedtime LOTEMAX 0.5 %  SUSP (LOTEPREDNOL ETABONATE) at bedtime KLS ALLERCLEAR 10 MG  TABS (LORATADINE) Take once daily BL GLUCOSAMINE-CHONDROITIN 500-400 MG  TABS (GLUCOSAMINE-CHONDROITIN)  SYNTHROID 125 MCG  TABS (LEVOTHYROXINE SODIUM) 1 qam FISH OIL ()  CAL WITH VIT D 600MG  I PO QD ()  VIT D 1000MG  I QD ()  CRANBERRY 1 PO QD ()  TYLENOL AS 2 QD ()  ASA 325 1 PO QD ()  RESTFUL LEGS 1 QHS ()  BACIGUENT 500 UNIT/GM  OINT (BACITRACIN) apply bid KEFLEX 500 MG  CAPS (CEPHALEXIN) 1 bid MIRAPEX 0.25 MG  TABS (PRAMIPEXOLE DIHYDROCHLORIDE) 1 qhs VITAMIN B-12 CR 1000 MCG  TBCR (CYANOCOBALAMIN) 1000 micrograms weekly X 4 then monthly thereafter LEVOTHROID 125 MCG  TABS (LEVOTHYROXINE SODIUM) take one tablet every morning Current Allergies: ! NEURONTIN ! MORPHINE    Medication Administration  Injection # 1:    Medication: Vit B12 1000 mcg    Diagnosis: ANEMIA NOS (ICD-285.9)    Route: IM    Site: R deltoid    Exp Date: 05/09/2009    Lot #: [id number]    Mfr: American Regent    Patient tolerated injection without complications    Given by: Floydene Flock (November 10, 2007 2:01 PM)  Orders Added: 1)  Vit B12 1000 mcg [J3420] 2)  Admin of Therapeutic Inj  intramuscular or subcutaneous Quintilian.Boros    ]  Medication Administration  Injection # 1:    Medication: Vit B12 1000 mcg    Diagnosis: ANEMIA NOS (ICD-285.9)    Route: IM    Site: R deltoid    Exp Date: 05/09/2009    Lot #: [id number]    Mfr: American Regent    Patient tolerated injection without complications    Given by: Floydene Flock (November 10, 2007 2:01 PM)  Orders Added: 1)  Vit B12 1000 mcg [J3420] 2)  Admin of Therapeutic Inj  intramuscular or subcutaneous [90772]

## 2011-01-08 NOTE — Progress Notes (Signed)
Summary: PROBLEM WITH CLONAZEPAM REFILL  Phone Note Call from Patient Call back at [phone]   Caller: Spouse Summary of Call: SPOUSE SAYS THAT THEY ARE [place] IN [place] AND NEED PATIENT'S PRESCRIPTION FOR CLONAZEPAM--I TOLD HIM PRESCRIPTION HAD BEEN SENT TO [facility] IN [place] AND ALL HE HAD TO DO WAS CALL THEM AND REQUEST A TRANSFER --HE SAYS THAT [facility] IN [place] STATES WE HAVE TO ACTUALLY SPEAK TO THEM OVER THE PHONE AND INFORM THEM THAT PATIENT WILL BE REQUESTING TRANSFER  SPOUSE SAYS PT HAS BEEN WAITING FIVE DAYS FOR THIS PRESCRIPTION---PLEASE CALL [facility] AS SOON AS POSSIBLE, THEN CALL SPOUSE AT [phone] TO CONFIRM WITH HIM THAT CALL TO [facility] IN [place] WAS MADE Initial call taken by: Jerolyn Shin,  August 29, 2010 9:45 AM  Follow-up for Phone Call        I called the pharmacy and informed them ok to transfer rx, I also called the patient's husband and informed him rx sent in  Follow-up by: Shonna Chock CMA,  August 29, 2010 10:50 AM

## 2011-01-08 NOTE — Assessment & Plan Note (Signed)
Summary: dysphagia--ch.   History of Present Illness Visit Type: consult  Primary GI MD: Melvia Heaps MD [facility] Primary Provider: Marga Melnick, MD  Requesting Provider: Marga Melnick, MD  Chief Complaint: Dysphagia, belching, bloating, and acid reflux with heartburn  History of Present Illness:   Tasha Moss is a pleasant 75 year old white female referred at the request of Dr. Alwyn Ren for evaluation of dysphagia.  She is having dysphagia particularly to pills that seem to lodge in her chest.  When this occurs she has pain  in her chest that radiates to her shoulder blades.  She may have dysphagia to solid foods as well if she does not chew properly.  She has pyrosis which is well controlled with omeprazole.  Weight has been stable.   GI Review of Systems    Reports acid reflux, belching, bloating, dysphagia with solids, and  heartburn.      Denies abdominal pain, chest pain, dysphagia with liquids, loss of appetite, nausea, vomiting, vomiting blood, weight loss, and  weight gain.        Denies anal fissure, black tarry stools, change in bowel habit, constipation, diarrhea, diverticulosis, fecal incontinence, heme positive stool, hemorrhoids, irritable bowel syndrome, jaundice, light color stool, liver problems, rectal bleeding, and  rectal pain.    Current Medications (verified): 1)  Pravachol 40 Mg  Tabs (Pravastatin Sodium) .... Take 1 Tablet At Bedtime For Cholesterol 2)  Fluoxetine Hcl 10 Mg  Tabs (Fluoxetine Hcl) .... Take 1 Tablet At Bedtime 3)  Lotemax 0.5 %  Susp (Loteprednol Etabonate) .... 2 Drops in Eye At Bedtime 4)  Osteo Bi-Flex Triple Strength  Tabs (Misc Natural Products) .... 2 Tablet Daily 5)  Fish Oil 1200 Mg Caps (Omega-3 Fatty Acids) .Marland Kitchen.. 1 By Mouth Once Daily 6)  Vitamin D3 1000 Unit Tabs (Cholecalciferol) .Marland Kitchen.. 1 By Mouth Once Daily 7)  Cranberry 1 Po Qd 8)  Levothroid 125 Mcg  Tabs (Levothyroxine Sodium) .... Take One Tablet Every Morning 9)  Tylenol Arthritis  Pain 650 Mg Cr-Tabs (Acetaminophen) .... One Tablet Two Times A Day 10)  Allergy Relief 10 Mg Tabs (Loratadine) .Marland Kitchen.. 1 By Mouth Once Daily 11)  Vitamin B-12 250 Mcg Tabs (Cyanocobalamin) .Marland Kitchen.. 1 By Mouth Once Daily 12)  Multivitamins  Tabs (Multiple Vitamin) .Marland Kitchen.. 1 By Mouth Once Daily 13)  Clonazepam 0.5 Mg Tabs (Clonazepam) .... 1/2 - 1 At Bedtime 14)  Vesicare 5 Mg Tabs (Solifenacin Succinate) .... Take One Tablet Daily 15)  Omeprazole 20 Mg Cpdr (Omeprazole) .Marland Kitchen.. 1 Pill 30 Min Pre B'fast & Eve Meal  Allergies (verified): 1)  ! Neurontin 2)  ! Morphine  Past History:  Past Medical History: Reviewed history from 05/15/2009 and no changes required. Temp memory loss 2006 SHOULDER PAIN, LEFT (ICD-719.41) ANEMIA NOS (ICD-285.9) RESTLESS LEG SYNDROME (ICD-333.94) HYPOTHYROIDISM (ICD-244.9) HYPERLIPIDEMIA (ICD-272.2) ADVEF, DRUG/MED/BIOL SUBST, ARTHUS PHENOMENON (ICD-995.21) HASHIMOTO'S THYROIDITIS (ICD-245.2) DEGENERATIVE JOINT DISEASE, HIPS (ICD-715.95)  Past Surgical History: thyroid surgery for cyst L-S  spine disc surgery renal calculi X1 Tonsillectomy hip surgery left hip surgery right thyroid biopsy Hashimoto's thyroiditis lobectomy09/09/2006 Cataract Surgery Bilateral   Family History: Reviewed history from 03/28/2008 and no changes required. mother diabetes drug related, cva , hepatitis father coad, chf, ?bysmnosis paternal aunt colon cancer paternal uncle colon cancer  Social History: Retired Married Never Smoked Alcohol use-no Regular exercise-no  Review of Systems       The patient complains of allergy/sinus, arthritis/joint pain, back pain, cough, fatigue, headaches-new, hearing problems, muscle pains/cramps, and urine leakage.  The patient denies anemia, anxiety-new, blood in urine, breast changes/lumps, change in vision, confusion, coughing up blood, depression-new, fainting, fever, heart murmur, heart rhythm changes, itching, menstrual pain, night  sweats, nosebleeds, pregnancy symptoms, shortness of breath, skin rash, sleeping problems, sore throat, swelling of feet/legs, swollen lymph glands, thirst - excessive, urination - excessive, urination changes/pain, vision changes, and voice change.         All other systems were reviewed and were negative   Vital Signs:  Patient profile:   75 year old female Height:      66 inches Weight:      185 pounds BMI:     29.97 BSA:     1.94 Pulse rate:   76 / minute Pulse rhythm:   regular BP sitting:   126 / 80  (left arm) Cuff size:   regular  Vitals Entered By: Ok Anis CMA (February 21, 2010 11:28 AM)  Physical Exam  Additional Exam:  On physical exam she is a well-developed well-nourished female  skin: anicteric HEENT: normocephalic; PEERLA; no nasal or pharyngeal abnormalities neck: supple nodes: no cervical lymphadenopathy chest: clear to ausculatation and percussion heart: no murmurs, gallops, or rubs abd: soft, nontender; BS normoactive; no abdominal masses, tenderness, organomegaly rectal: deferred ext: no cynanosis, clubbing, edema skeletal: no deformities neuro: oriented x 3; no focal abnormalities    Impression & Recommendations:  Problem # 1:  STRICTURE AND STENOSIS OF ESOPHAGUS (ICD-530.3)  Patient's dysphagia it is likely due to a peptic stricture.  Recommendations #1 upper endoscopy with dilatation as indicated #2 continue omeprazole  Risks, alternatives, and complications of the procedure, including bleeding, perforation, and possible need for surgery, were explained to the patient.  Patient's questions were answered.  Orders: EGD (EGD)  Problem # 2:  SPECIAL SCREENING FOR MALIGNANT NEOPLASMS COLON (ICD-V76.51) Patient has not had a colonoscopy.  This was recommended.  She will take it under advisement   Patient Instructions: 1)  Conscious Sedation brochure given.  2)  Upper Endoscopy with Dilatation brochure given.  3)  Your EGD is scheduled for  03/01/2010 at 3:30pm 4)  The medication list was reviewed and reconciled.  All changed / newly prescribed medications were explained.  A complete medication list was provided to the patient / caregiver.

## 2011-01-08 NOTE — Progress Notes (Signed)
Summary: Triage-Constipation  Phone Note Call from Patient Call back at [facility] Phone [phone]   Caller: Patient Call For: Dr. Arlyce Dice Reason for Call: Talk to Nurse Summary of Call: pt. is very constipated...has not had a BM in 10 days. Took miralax and fleet enemia. Still no BM Initial call taken by: Karna Christmas,  July 23, 2010 8:42 AM  Follow-up for Phone Call        Pt. saw Willette Cluster NP on 07-16-10. Pt. c/o severe constipation, no BM for 10 days.  Has tried Miralax daily X6 days, has also used a glycerin supp. daily for 6 days and took a fleets enema yesterday--still no results.  Pt. also c/o some nausea, the Left side pain has improved, but she does have lower mid. abd pain,. bloating and tenderness.  Denies fever.  Pt. states she has a BM daily, "This is not like me at all!"  Saw PCP on 07-18-10, was advised to call GI if no improvement by today.   1) Milk of magnesia now. Glass of warm prune juice at bedtime.If no BM by the morning, then repeat dose of MOM. 2) Increase Miralax to two times a day until constipation resolved, then use as needed. 3) If symptoms become worse call back immediately or go to ER. 4) I will call pt., if new orders, after MD reviews.   Follow-up by: Laureen Ochs LPN,  July 23, 2010 10:48 AM  Additional Follow-up for Phone Call Additional follow up Details #1::        ok Additional Follow-up by: Louis Meckel MD,  July 23, 2010 11:51 AM

## 2011-01-08 NOTE — Progress Notes (Signed)
Summary: rx - dr hopper  Phone Note Call from Patient   Caller: Spouse Summary of Call: patient wants rx for 90 day supply levothyroxine - generic  not brand - patient will pick up [id number] Initial call taken by: Okey Regal Spring,  Apr 13, 2008 9:29 AM  Follow-up for Phone Call        RX placed on ledge for sig , then to front for pick-up. Patient will pick-up on 03/15/2008(tomorrow). Shonna Chock  Apr 13, 2008 10:23 AM       Prescriptions: LEVOTHROID 125 MCG  TABS (LEVOTHYROXINE SODIUM) take one tablet every morning  #90 x 3   Entered by:   Shonna Chock   Authorized by:   Marga Melnick MD   Signed by:   Shonna Chock on 04/13/2008   Method used:   Print then Give to Patient   RxID:   [id number]

## 2011-01-08 NOTE — Progress Notes (Signed)
Summary: PT NEEDS CELEBREX AGAIN FOR ARM PAIN  Phone Note Call from Patient Call back at Home Phone [phone]   Caller: Patient Reason for Call: Refill Medication, Talk to Nurse Summary of Call: DR HOPPER// PT IS HAVING PAIN ON THE  LEFT ARM NOT HAVING GOOD MOVEMENT. PT USE TO TAKE CELEBREX FOR INFLAMATION AND THINKS SHE NEEDS IT AGAIN. PHARMACY IS [facility] [phone] Initial call taken by: Job Founds,  January 20, 2008 9:32 AM  Follow-up for Phone Call        s/w pt who said had neck surg in sept thinks left arm getting bursitis in it it hurts a lot can move it but it hurts have been on celebrex rx ran informed pt would need ov ov sched for tomorrow...................................................................Marland KitchenKandice Hams  January 20, 2008 10:11 AM  Follow-up by: Kandice Hams,  January 20, 2008 10:11 AM

## 2011-01-08 NOTE — Assessment & Plan Note (Signed)
Summary: rto 6 months/cbs   Vital Signs:  Patient profile:   75 year old female Height:      66 inches Weight:      184 pounds Temp:     98.5 degrees F oral Pulse rate:   76 / minute Resp:     17 per minute BP sitting:   110 / 62  (left arm)  Vitals Entered By: Jeremy Johann CMA (October 24, 2010 8:37 AM) CC: F/U fasting, ingestionx1year, Heartburn   Primary Care Provider:  Marga Melnick, MD   CC:  F/U fasting, ingestionx1year, and Heartburn.  History of Present Illness: Hyperlipidemia Follow-Up      This is a 75 year old woman who presents for Hyperlipidemia follow-up.  The patient reports muscle aches, GI upset, and constipation, but denies abdominal pain, flushing, itching, diarrhea, and fatigue.  The patient denies the following symptoms: chest pain/pressure, exercise intolerance, dypsnea, palpitations, syncope, and pedal edema.  Compliance with medications (by patient report) has been near 100%.  Dietary compliance has been good.  The patient reports no  regular exercise.   Fish oil D/Ced due to belching ; no change with this. GERD      The patient also presents with active ERD symptoms.  The patient reports acid reflux, sour taste in mouth, epigastric pain, and trouble swallowing pills, but denies weight loss and weight gain.  The patient reports the following alarm features of dyspepsia: dysphagia, especialy with bread.  The patient denies the following alarm features: melena, hematemesis, and vomiting.  Symptoms are worse with spicy foods & milk.  Prior evaluation has included EGD in 02/2010:gastritis, esophageal stricture with inflammation on biopsy.  The patient has found the following treatments to be effective: an antacid after food triggers and  OTC Ranitidine 150  mg .  Current Medications (verified): 1)  Pravachol 40 Mg  Tabs (Pravastatin Sodium) .... Take 1 Tablet At Bedtime For Cholesterol 2)  Fluoxetine Hcl 10 Mg Caps (Fluoxetine Hcl) .Marland Kitchen.. 1 By Mouth At Bedtime 3)   Lotemax 0.5 %  Susp (Loteprednol Etabonate) .... 2 Drops in Eye At Bedtime 4)  Vitamin D3 1000 Unit Tabs (Cholecalciferol) .Marland Kitchen.. 1 By Mouth Once Daily 5)  Cranberry 1 Po Qd 6)  Levothroid 125 Mcg  Tabs (Levothyroxine Sodium) .... Take One Tablet Every Morning 7)  Tylenol Arthritis Pain 650 Mg Cr-Tabs (Acetaminophen) .... One Tablet Two Times A Day 8)  Allergy Relief 10 Mg Tabs (Loratadine) .Marland Kitchen.. 1 By Mouth Once Daily 9)  Vitamin B-12 250 Mcg Tabs (Cyanocobalamin) .Marland Kitchen.. 1 By Mouth Once Daily 10)  Multivitamins  Tabs (Multiple Vitamin) .Marland Kitchen.. 1 By Mouth Once Daily 11)  Clonazepam 0.5 Mg Tabs (Clonazepam) .... 1/2 - 1 At Bedtime 12)  Vesicare 5 Mg Tabs (Solifenacin Succinate) .... Take One Tablet Daily 13)  Tums 500 Mg Chew (Calcium Carbonate Antacid) .... Chew 1 Tab As Needed For Heart Burn 14)  Glucosamine-2000 .... Take 1 Tab Once Daily 15)  Acid Reducer Maximum Strength 150 Mg Tabs (Ranitidine Hcl) .... As Needed  Allergies (verified): 1)  ! Neurontin 2)  ! Morphine  Review of Systems Eyes:  Denies blurring, double vision, and vision loss-both eyes. ENT:  Complains of hoarseness. Derm:  Denies changes in nail beds, dryness, and hair loss. Neuro:  Complains of numbness and tingling; Some N&T in R hand. Endo:  Denies cold intolerance and heat intolerance.  Physical Exam  General:  in no acute distress; alert,appropriate and cooperative throughout examination Eyes:  No corneal or conjunctival inflammation noted.No lid lag . No icterus Mouth:  Oral mucosa and oropharynx without lesions or exudates.  No pharyngeal erythema.   Neck:  No deformities, masses, or tenderness noted. Lungs:  Normal respiratory effort, chest expands symmetrically. Lungs are clear to auscultation, no crackles or wheezes. Heart:  Normal rate and regular rhythm. S1 and S2 normal without gallop, murmur, click, rub.S4 Abdomen:  Bowel sounds positive,abdomen soft and non-tender without masses, organomegaly or hernias  noted. Pulses:  R and L carotid,radial,dorsalis pedis and posterior tibial pulses are full and equal bilaterally Extremities:  No clubbing, cyanosis, edema.Minor OA finger changes Neurologic:  alert & oriented X3 and DTRs symmetrical and normal.  Eqwuivocal Tinel's RUE Skin:  Intact without suspicious lesions. No jaundice Cervical Nodes:  No lymphadenopathy noted Axillary Nodes:  No palpable lymphadenopathy Psych:  memory intact for recent and remote, normally interactive, and good eye contact.     Impression & Recommendations:  Problem # 1:  HYPOTHYROIDISM (ICD-244.9)  Her updated medication list for this problem includes:    Levothroid 125 Mcg Tabs (Levothyroxine sodium) .Marland Kitchen... Take one tablet every morning  Orders: Venipuncture (53664) TLB-TSH (Thyroid Stimulating Hormone) (84443-TSH)  Problem # 2:  ESOPHAGEAL REFLUX (ICD-530.81) Assessment: Unchanged  with dysphagia; PMH of stricture The following medications were removed from the medication list:    Hyomax-sl 0.125 Mg Subl (Hyoscyamine sulfate) .Marland Kitchen... Put 1 under tongue every 4 hours as needed for pain.    Omeprazole 20 Mg Cpdr (Omeprazole) .Marland Kitchen... 1 by mouth two times a day as needed Her updated medication list for this problem includes:    Tums 500 Mg Chew (Calcium carbonate antacid) .Marland Kitchen... Chew 1 tab as needed for heart burn    Acid Reducer Maximum Strength 150 Mg Tabs (Ranitidine hcl) .Marland Kitchen... As needed    Omeprazole 20 Mg Cpdr (Omeprazole) .Marland Kitchen... 1 pill 30 min pre b'fast  Orders: Venipuncture (40347) TLB-CBC Platelet - w/Differential (85025-CBCD) Prescription Created Electronically [phone])  Problem # 3:  HYPERLIPIDEMIA (ICD-272.2)  Her updated medication list for this problem includes:    Pravachol 40 Mg Tabs (Pravastatin sodium) .Marland Kitchen... Take 1 tablet at bedtime for cholesterol  Orders: Venipuncture (63875) TLB-Lipid Panel (80061-LIPID) TLB-Hepatic/Liver Function Pnl (80076-HEPATIC) TLB-BMP (Basic Metabolic Panel-BMET)  (80048-METABOL)  Complete Medication List: 1)  Pravachol 40 Mg Tabs (Pravastatin sodium) .... Take 1 tablet at bedtime for cholesterol 2)  Fluoxetine Hcl 10 Mg Caps (Fluoxetine hcl) .Marland Kitchen.. 1 by mouth at bedtime 3)  Lotemax 0.5 % Susp (Loteprednol etabonate) .... 2 drops in eye at bedtime 4)  Vitamin D3 1000 Unit Tabs (Cholecalciferol) .Marland Kitchen.. 1 by mouth once daily 5)  Cranberry 1 Po Qd  6)  Levothroid 125 Mcg Tabs (Levothyroxine sodium) .... Take one tablet every morning 7)  Tylenol Arthritis Pain 650 Mg Cr-tabs (Acetaminophen) .... One tablet two times a day 8)  Allergy Relief 10 Mg Tabs (Loratadine) .Marland Kitchen.. 1 by mouth once daily 9)  Vitamin B-12 250 Mcg Tabs (Cyanocobalamin) .Marland Kitchen.. 1 by mouth once daily 10)  Multivitamins Tabs (Multiple vitamin) .Marland Kitchen.. 1 by mouth once daily 11)  Clonazepam 0.5 Mg Tabs (Clonazepam) .... 1/2 - 1 at bedtime 12)  Vesicare 5 Mg Tabs (Solifenacin succinate) .... Take one tablet daily 13)  Tums 500 Mg Chew (Calcium carbonate antacid) .... Chew 1 tab as needed for heart burn 14)  Glucosamine-2000  .... Take 1 tab once daily 15)  Acid Reducer Maximum Strength 150 Mg Tabs (Ranitidine hcl) .... As needed 16)  Omeprazole  20 Mg Cpdr (Omeprazole) .Marland Kitchen.. 1 pill 30 min pre b'fast  Patient Instructions: 1)  Avoid triggers as discussed.Avoid foods high in acid (tomatoes, citrus juices, spicy foods). Avoid eating within two hours of lying down or before exercising. Do not over eat; try smaller more frequent meals. Elevate head of bed twelve inches when sleeping. Prescriptions: OMEPRAZOLE 20 MG CPDR (OMEPRAZOLE) 1 pill 30 min pre b'fast  #30 x 5   Entered and Authorized by:   Marga Melnick MD   Signed by:   Marga Melnick MD on 10/24/2010   Method used:   Faxed to ...       [facility] (retail)       [address]       [place], [place]  [address]       Ph: [phone]       Fax: [phone]   RxID:   [id number]    Orders Added: 1)  Est. Patient  Level IV [84132] 2)  Venipuncture [44010] 3)  TLB-Lipid Panel [80061-LIPID] 4)  TLB-Hepatic/Liver Function Pnl [80076-HEPATIC] 5)  TLB-TSH (Thyroid Stimulating Hormone) [84443-TSH] 6)  TLB-CBC Platelet - w/Differential [85025-CBCD] 7)  TLB-BMP (Basic Metabolic Panel-BMET) [80048-METABOL] 8)  Prescription Created Electronically [U7253]  Appended Document: Orders Update    Clinical Lists Changes  Orders: Added new Test order of TLB-CK-MB (Creatine Kinase MB) (82553-CKMB) - Signed      Appended Document: rto 6 months/cbs   Appended Document: Orders Update    Clinical Lists Changes  Orders: Added new Test order of TLB-CK Total Only(Creatine Kinase/CPK) (82550-CK) - Signed

## 2011-01-08 NOTE — Assessment & Plan Note (Signed)
Summary: acute/arm pain/alr   Vital Signs:  Patient Profile:   75 Years Old Female Weight:      191.8 pounds Temp:     97.8 degrees F oral Pulse rate:   80 / minute Resp:     12 per minute BP sitting:   130 / 64  (left arm)  Pt. in pain?   no  Vitals Entered By: Wendall Stade (January 21, 2008 2:45 PM)                  Chief Complaint:  pain 7/10 left am increase with ambulation, follow-up on ear, and disscuss starting celebrex.  History of Present Illness: No neck pain  since N-S by Dr Venetia Maxon in 9/08. Pain & tenderness L upper humeral area, worse supine, better with LUE abducted & flexed Present since 11/08. Tylenol, heat, Arthritis Pain Tylenol with some benefit.Celebrex helped. PMH pain same area treated with injections by Dr Charlett Blake.Also sore R ear no betterwith topical cream 9/08.    Current Allergies (reviewed today): ! NEURONTIN ! MORPHINE     Review of Systems  General      Denies chills, fever, sweats, and weight loss.  MS      See HPI      Complains of joint pain and muscle aches.      Denies joint redness, joint swelling, loss of strength, cramps, and muscle weakness.   Physical Exam  General:     Well-developed,well-nourished,in no acute distress; alert,appropriate and cooperative throughout examination Ears:     Small erythematous papule R ext ear. Neck:     Full ROM. Pulses:     Radial pulses = Extremities:     DJD hands. Pain mainly with L  shoulder rotation.Tender to palpation L upper humeral area Neurologic:     strength normal in all extremities and DTRs symmetrical and normal.      Impression & Recommendations:  Problem # 1:  SHOULDER PAIN, LEFT (ICD-719.41)  Orders: Orthopedic Referral (Ortho)  Her updated medication list for this problem includes:    Celebrex 200 Mg Caps (Celecoxib) .Marland Kitchen... 1-2 once daily as needed   Problem # 2:  SKIN LESION (ICD-709.9)  Orders: Dermatology Referral (Derma)   Complete Medication  List: 1)  Pravachol 40 Mg Tabs (Pravastatin sodium) .... Take 1 tablet at bedtime for cholesterol 2)  Fluoxetine Hcl 10 Mg Tabs (Fluoxetine hcl) .... Take 1 tablet at bedtime 3)  Lotemax 0.5 % Susp (Loteprednol etabonate) .... At bedtime 4)  Kls Allerclear 10 Mg Tabs (Loratadine) .... Take once daily 5)  Bl Glucosamine-chondroitin 500-400 Mg Tabs (Glucosamine-chondroitin) 6)  Synthroid 125 Mcg Tabs (Levothyroxine sodium) .Marland Kitchen.. 1 qam 7)  Fish Oil  8)  Cal With Vit D 600mg  I Po Qd  9)  Vit D 1000mg  I Qd  10)  Cranberry 1 Po Qd  11)  Tylenol As 2 Qd  12)  Asa 325 1 Po Qd  13)  Restful Legs 1 Qhs Prn  14)  Baciguent 500 Unit/gm Oint (Bacitracin) .... Apply bid 15)  Mirapex 0.25 Mg Tabs (Pramipexole dihydrochloride) .Marland Kitchen.. 1 qhs 16)  Vitamin B-12 Cr 1000 Mcg Tbcr (Cyanocobalamin) .Marland Kitchen.. 1000 micrograms weekly x 4 then monthly thereafter 17)  Levothroid 125 Mcg Tabs (Levothyroxine sodium) .... Take one tablet every morning 18)  Celebrex 200 Mg Caps (Celecoxib) .Marland Kitchen.. 1-2 once daily as needed   Patient Instructions: 1)  Use cervical pillow. Schedule fasting lipids, hepatic panel, TSH, A1c( 244.9, 272.4, 995.20), appt 1  week later.    Prescriptions: CELEBREX 200 MG  CAPS (CELECOXIB) 1-2 once daily as needed  #20 x 0   Entered and Authorized by:   Marga Melnick MD   Signed by:   Marga Melnick MD on 01/21/2008   Method used:   Print then Give to Patient   RxID:   [id number]  ]  Appended Document: acute/arm pain/alr   Medication Administration  Injection # 1:    Medication: Vit B12 1000 mcg    Diagnosis: ANEMIA NOS (ICD-285.9)    Route: IM    Site: R deltoid    Exp Date: 09/2009    Lot #: [id number]    Mfr: [employer]    Patient tolerated injection without complications    Given by: Shonna Chock (January 21, 2008 3:43 PM)  Orders Added: 1)  Vit B12 1000 mcg [J3420] 2)  Admin of Therapeutic Inj  intramuscular or subcutaneous [14782]

## 2011-01-08 NOTE — Progress Notes (Signed)
Summary: generic for synthroid- dr Anay Walter  Phone Note Call from Patient Call back at Home Phone [phone]   Caller: Patient Summary of Call: patient wants to know if she can have rx for generic  synthroid - ins is requesting she switch to genericSweet Jarvis apoth - [id number] Initial call taken by: Okey Regal Spring,  September 23, 2007 4:41 PM  Follow-up for Phone Call        forward to hop Follow-up by: Doristine Devoid,  September 25, 2007 5:39 PM  Additional Follow-up for Phone Call Additional follow up Details #1::        ok but will need TSH after 3 months to verify efficacy Additional Follow-up by: Marga Melnick MD,  September 28, 2007 12:59 PM

## 2011-01-08 NOTE — Procedures (Signed)
Summary: Colonoscopy  Patient: Sequita Wise Note: All result statuses are Final unless otherwise noted.  Tests: (1) Colonoscopy (COL)   COL Colonoscopy           DONE     [facility]     [address]     [place], [place]  [address]           COLONOSCOPY PROCEDURE REPORT           PATIENT:  Kilie, Rund  MR#:  [id number]     BIRTHDATE:  1935/12/25, 74 yrs. old  GENDER:  female           ENDOSCOPIST:  Barbette Hair. Arlyce Dice, MD     Referred by:           PROCEDURE DATE:  04/17/2010     PROCEDURE:  Diagnostic Colonoscopy     ASA CLASS:  Class II     INDICATIONS:  1) Routine Risk Screening           MEDICATIONS:   Fentanyl 75 mcg IV, Versed 10 mg IV           DESCRIPTION OF PROCEDURE:   After the risks benefits and     alternatives of the procedure were thoroughly explained, informed     consent was obtained.  Digital rectal exam was performed and     revealed no abnormalities.   The LB [id number] endoscope     was introduced through the anus and advanced to the cecum, which     was identified by the ileocecal valve, without limitations.  The     quality of the prep was adequate, using MoviPrep.  The instrument     was then slowly withdrawn as the colon was fully examined.     <<PROCEDUREIMAGES>>           FINDINGS:  Moderate diverticulosis was found in the sigmoid colon     (see image1, image10, and image11).  This was otherwise a normal     examination of the colon (see image4, image5, image6, image8,     image13, and image14).   Retroflexed views in the rectum revealed     no abnormalities.    The time to cecum =  12.0  minutes. The scope     was then withdrawn (time =  6.0  min) from the patient and the     procedure completed.           COMPLICATIONS:  None           ENDOSCOPIC IMPRESSION:     1) Moderate diverticulosis in the sigmoid colon     2) Otherwise normal examination     RECOMMENDATIONS:     1) Continue current colorectal screening  recommendations for     "routine risk" patients with a repeat colonoscopy in 10 years.           REPEAT EXAM:  In 10 year(s) for Colonoscopy.           ______________________________     Barbette Hair. Arlyce Dice, MD           CC: Pecola Lawless, MD           n.     Rosalie Doctor:   Barbette Hair. Jevon Littlepage at 04/17/2010 03:19 PM           Harrell Gave, [id number]  Note: An exclamation mark (!) indicates a result that was not dispersed into the flowsheet. Document Creation  Date: 04/17/2010 3:20 PM _______________________________________________________________________  (1) Order result status: Final Collection or observation date-time: 04/17/2010 15:12 Requested date-time:  Receipt date-time:  Reported date-time:  Referring Physician:   Ordering Physician: Melvia Heaps [id number]) Specimen Source:  Source: Launa Grill Order Number: [id number] Lab site:   Appended Document: Colonoscopy    Clinical Lists Changes  Observations: Added new observation of COLONNXTDUE: 04/2020 (04/17/2010 15:22)

## 2011-01-08 NOTE — Progress Notes (Signed)
Summary: hopper-shingles vaccine script to be called in/left msg to call  Phone Note Refill Request Call back at Home Phone [phone]   Refills Requested: Medication #1:  zostovax pt insurance will cover shingles shot if it is called in through the pharmacy [facility] ph [phone] fax [phone]  Initial call taken by: Gwen Pounds,  December 29, 2007 1:58 PM  Follow-up for Phone Call        see article on Shingles vaccine; ok if no contraindication Follow-up by: Marga Melnick MD,  December 30, 2007 9:01 AM  Additional Follow-up for Phone Call Additional follow up Details #1::        left msg for pt to call re shingles vac request and contrainindications...................................................................Marland KitchenKandice Hams  December 30, 2007 11:20 AM  Additional Follow-up by: Kandice Hams,  December 30, 2007 11:20 AM    Additional Follow-up for Phone Call Additional follow up Details #2::    called shingles vacination in to the pharmacy,  They are setting up to give the injection in the next two weeks so patient can decide whether or not to get vacine there or here....................................................................Marland KitchenWendall Stade  December 30, 2007 11:31 AM

## 2011-01-08 NOTE — Letter (Signed)
Summary: [facility]  [facility]   Imported By: Lanelle Bal 02/15/2010 10:28:15  _____________________________________________________________________  External Attachment:    Type:   Image     Comment:   External Document

## 2011-01-08 NOTE — Letter (Signed)
Summary: Results Letter  [facility]  [address] [place], [place] [address]   Phone: [phone]  Fax: [phone]        March 06, 2010 MRN: [id number]    Tasha Moss [address] [place], [place]  [address]    Dear [name],   Your biopsies demonstrated inflammatory changes only.    Please follow the recommendations previously discussed.  Should you have any immediate concerns or questions, feel free to contact me at the office.    Sincerely,  Barbette Hair. Arlyce Dice, M.D., [facility]          Sincerely,  Louis Meckel MD  This letter has been electronically signed by your physician.  Appended Document: Results Letter letter mailed 3.30.11

## 2011-01-08 NOTE — Assessment & Plan Note (Signed)
Summary: to review labs//tl   Vital Signs:  Patient Profile:   75 Years Old Female Weight:      192.50 pounds Pulse rate:   72 / minute Pulse rhythm:   regular BP sitting:   126 / 66  (left arm) Cuff size:   large  Pt. in pain?   no  Vitals Entered By: Wendall Stade (September 08, 2007 1:44 PM)                  Chief Complaint:  follow up labs.  History of Present Illness: On Pravastatin 40 mg  LDL 92; HDL 36; TG 182; LFTs WNL. No specific diet; decreased CVE due to leg pains.                                                                                                                                                                                 Also concerned about ear lesion X 2 weeks; Rx: Lanacaine. Also legs hurt & tingle; ? restless leg syndrome.   Lipid Management History:      Positive NCEP/ATP III risk factors include female age 54 years old or older and HDL cholesterol less than 40.  Negative NCEP/ATP III risk factors include no history of early menopause without estrogen hormone replacement, non-diabetic, no family history for ischemic heart disease, non-tobacco-user status, non-hypertensive, no ASHD (atherosclerotic heart disease), no prior stroke/TIA, no peripheral vascular disease, and no history of aortic aneurysm.     Current Allergies: ! NEURONTIN ! MORPHINE Updated/Current Medications (including changes made in today's visit):  PRAVACHOL 40 MG  TABS (PRAVASTATIN SODIUM) TAKE 1 TABLET AT BEDTIME FOR CHOLESTEROL FLUOXETINE HCL 10 MG  TABS (FLUOXETINE HCL) Take 1 tablet at bedtime LOTEMAX 0.5 %  SUSP (LOTEPREDNOL ETABONATE) at bedtime KLS ALLERCLEAR 10 MG  TABS (LORATADINE) Take once daily BL GLUCOSAMINE-CHONDROITIN 500-400 MG  TABS (GLUCOSAMINE-CHONDROITIN)  SYNTHROID 125 MCG  TABS (LEVOTHYROXINE SODIUM) 1 qam * FISH OIL  * CAL WITH VIT D 600MG  I PO QD  * VIT D 1000MG  I QD  * CRANBERRY 1 PO QD  * TYLENOL AS 2 QD  * ASA 325 1 PO QD  * RESTFUL LEGS 1  QHS  BACIGUENT 500 UNIT/GM  OINT (BACITRACIN) apply bid KEFLEX 500 MG  CAPS (CEPHALEXIN) 1 bid MIRAPEX 0.25 MG  TABS (PRAMIPEXOLE DIHYDROCHLORIDE) 1 qhs   Past Medical History:    Hypothyroidism    Temp memory loss 2006    Risk Factors:  Tobacco use:  never  Family History Risk Factors:    Family History of MI in females < 55 years old:  no    Family History of MI in males < 28  years old:  no   Review of Systems  GI      Denies bloody stools, change in bowel habits, constipation, dark tarry stools, and diarrhea.      FOB done @ Gyn ; no colonoscopy " It's one of my dreads" (Standard of Care  reviewed)   Physical Exam  General:     Well-developed,well-nourished,in no acute distress; alert,appropriate and cooperative throughout examination Ears:     Minor erythema R  helix Neck:     Thyroid asymmetric post op Lungs:     Normal respiratory effort, chest expands symmetrically. Lungs are clear to auscultation, no crackles or wheezes. Heart:     normal rate, regular rhythm, no gallop, no rub, no JVD, no HJR, and grade 1 /6 systolic murmur.   Abdomen:     Bowel sounds positive,abdomen soft and non-tender without masses, organomegaly or hernias noted. Pulses:     R and L carotid,radial,dorsalis pedis and posterior tibial pulses are full and equal bilaterally Extremities:     trace left pedal edema and trace right pedal edema.   Neurologic:     strength normal in all extremities and DTRs symmetrical and normal.   Cervical Nodes:     No lymphadenopathy noted Axillary Nodes:     No palpable lymphadenopathy    Impression & Recommendations:  Problem # 1:  HYPERLIPIDEMIA (ICD-272.2)  The following medications were removed from the medication list:    Pravachol 40 Mg Tabs (Pravastatin sodium) .Marland Kitchen... Take 1 tablet at bed time  Her updated medication list for this problem includes:    Pravachol 40 Mg Tabs (Pravastatin sodium) .Marland Kitchen... Take 1 tablet at bedtime for  cholesterol   Problem # 2:  HYPOTHYROIDISM (ICD-244.9)  Her updated medication list for this problem includes:    Synthroid 125 Mcg Tabs (Levothyroxine sodium) .Marland Kitchen... 1 qam  Orders: TLB-TSH (Thyroid Stimulating Hormone) (84443-TSH)   Problem # 3:  CELLULITIS, FACE (ICD-682.0)  Her updated medication list for this problem includes:    Keflex 500 Mg Caps (Cephalexin) .Marland Kitchen... 1 bid   Problem # 4:  RESTLESS LEG SYNDROME (ICD-333.94)  Orders: TLB-CBC Platelet - w/Differential (85025-CBCD) TLB-B12 + Folate Pnl (21308_65784-O96/EXB) TLB-Iron, (Fe) Total (83540-FE) TLB-Transferrin (84466-TRNSF) TLB-TSH (Thyroid Stimulating Hormone) (84443-TSH)   Complete Medication List: 1)  Pravachol 40 Mg Tabs (Pravastatin sodium) .... Take 1 tablet at bedtime for cholesterol 2)  Fluoxetine Hcl 10 Mg Tabs (Fluoxetine hcl) .... Take 1 tablet at bedtime 3)  Lotemax 0.5 % Susp (Loteprednol etabonate) .... At bedtime 4)  Kls Allerclear 10 Mg Tabs (Loratadine) .... Take once daily 5)  Bl Glucosamine-chondroitin 500-400 Mg Tabs (Glucosamine-chondroitin) 6)  Synthroid 125 Mcg Tabs (Levothyroxine sodium) .Marland Kitchen.. 1 qam 7)  Fish Oil  8)  Cal With Vit D 600mg  I Po Qd  9)  Vit D 1000mg  I Qd  10)  Cranberry 1 Po Qd  11)  Tylenol As 2 Qd  12)  Asa 325 1 Po Qd  13)  Restful Legs 1 Qhs  14)  Baciguent 500 Unit/gm Oint (Bacitracin) .... Apply bid 15)  Keflex 500 Mg Caps (Cephalexin) .Marland Kitchen.. 1 bid 16)  Mirapex 0.25 Mg Tabs (Pramipexole dihydrochloride) .Marland Kitchen.. 1 qhs  Lipid Assessment/Plan:      Based on NCEP/ATP III, the patient's risk factor category is "2 or more risk factors and a calculated 10 year CAD risk of < 20%".  From this information, the patient's calculated lipid goals are as follows: Total cholesterol goal is 200; LDL cholesterol  goal is 130; HDL cholesterol goal is 40; Triglyceride goal is 150.  Her LDL cholesterol goal has been met.     Patient Instructions: 1)  Please schedule a follow-up appointment  in 6 months. Follow low carb diet of choice 2)  Lipid Panel prior to visit, ICD-9: 272.2    Prescriptions: MIRAPEX 0.25 MG  TABS (PRAMIPEXOLE DIHYDROCHLORIDE) 1 qhs  #30 x 5   Entered and Authorized by:   Marga Melnick MD   Signed by:   Marga Melnick MD on 09/08/2007   Method used:   Print then Give to Patient   RxID:   [id number] KEFLEX 500 MG  CAPS (CEPHALEXIN) 1 bid  # 20 x 0   Entered and Authorized by:   Marga Melnick MD   Signed by:   Marga Melnick MD on 09/08/2007   Method used:   Print then Give to Patient   RxID:   [id number] BACIGUENT 500 UNIT/GM  OINT (BACITRACIN) apply bid  #15 grams x 0   Entered and Authorized by:   Marga Melnick MD   Signed by:   Marga Melnick MD on 09/08/2007   Method used:   Print then Give to Patient   RxID:   [id number]  ]

## 2011-01-08 NOTE — Assessment & Plan Note (Signed)
Summary: POST ENDO. F/U             [facility]   History of Present Illness Visit Type: Follow-up Visit Primary GI MD: Melvia Heaps MD [facility] Primary Provider: Marga Melnick, MD  Requesting Provider: n/a Chief Complaint: Follow up visit from endoscopy, pt still c/o irritated stomach, she states that the omeprazole is causing her to be jittery and have constipation. Swallowing is better now. Wants to know which meds she takes would cause her stomach to be irritated History of Present Illness:   Mrs. Nordgren has returned following upper endoscopy and dilatation of a distal esophageal stricture. Chronic gastritis was also seen.  Her dysphagia has resolved.  She does complain of a very poorly described sensation in her upper abdomen of very mild discomfort, intermittently.  She also developed a jitteriness when taking omeprazole twice a day. Jitteriness has continued  despite reducing it to once a day.   GI Review of Systems    Reports dysphagia with solids.      Denies abdominal pain, acid reflux, belching, bloating, chest pain, dysphagia with liquids, heartburn, loss of appetite, nausea, vomiting, vomiting blood, weight loss, and  weight gain.      Reports constipation.     Denies anal fissure, black tarry stools, change in bowel habit, diarrhea, diverticulosis, fecal incontinence, heme positive stool, hemorrhoids, irritable bowel syndrome, jaundice, light color stool, liver problems, rectal bleeding, and  rectal pain.    Current Medications (verified): 1)  Pravachol 40 Mg  Tabs (Pravastatin Sodium) .... Take 1 Tablet At Bedtime For Cholesterol 2)  Fluoxetine Hcl 10 Mg  Tabs (Fluoxetine Hcl) .... Take 1 Tablet At Bedtime 3)  Lotemax 0.5 %  Susp (Loteprednol Etabonate) .... 2 Drops in Eye At Bedtime 4)  Osteo Bi-Flex Triple Strength  Tabs (Misc Natural Products) .... 2 Tablet Daily 5)  Fish Oil 1200 Mg Caps (Omega-3 Fatty Acids) .Marland Kitchen.. 1 By Mouth Once Daily 6)  Vitamin D3 1000 Unit Tabs  (Cholecalciferol) .Marland Kitchen.. 1 By Mouth Once Daily 7)  Cranberry 1 Po Qd 8)  Levothroid 125 Mcg  Tabs (Levothyroxine Sodium) .... Take One Tablet Every Morning 9)  Tylenol Arthritis Pain 650 Mg Cr-Tabs (Acetaminophen) .... One Tablet Two Times A Day 10)  Allergy Relief 10 Mg Tabs (Loratadine) .Marland Kitchen.. 1 By Mouth Once Daily 11)  Vitamin B-12 250 Mcg Tabs (Cyanocobalamin) .Marland Kitchen.. 1 By Mouth Once Daily 12)  Multivitamins  Tabs (Multiple Vitamin) .Marland Kitchen.. 1 By Mouth Once Daily 13)  Clonazepam 0.5 Mg Tabs (Clonazepam) .... 1/2 - 1 At Bedtime 14)  Vesicare 5 Mg Tabs (Solifenacin Succinate) .... Take One Tablet Daily 15)  Omeprazole 20 Mg Cpdr (Omeprazole) .Marland Kitchen.. 1 Pill 30 Min Pre B'fast & Eve Meal  Allergies (verified): 1)  ! Neurontin 2)  ! Morphine  Past History:  Past Medical History: Last updated: 05/15/2009 Temp memory loss 2006 SHOULDER PAIN, LEFT (ICD-719.41) ANEMIA NOS (ICD-285.9) RESTLESS LEG SYNDROME (ICD-333.94) HYPOTHYROIDISM (ICD-244.9) HYPERLIPIDEMIA (ICD-272.2) ADVEF, DRUG/MED/BIOL SUBST, ARTHUS PHENOMENON (ICD-995.21) HASHIMOTO'S THYROIDITIS (ICD-245.2) DEGENERATIVE JOINT DISEASE, HIPS (ICD-715.95)  Past Surgical History: Last updated: 02/21/2010 thyroid surgery for cyst L-S  spine disc surgery renal calculi X1 Tonsillectomy hip surgery left hip surgery right thyroid biopsy Hashimoto's thyroiditis lobectomy09/09/2006 Cataract Surgery Bilateral   Family History: Last updated: 03/28/2008 mother diabetes drug related, cva , hepatitis father coad, chf, ?bysmnosis paternal aunt colon cancer paternal uncle colon cancer  Family History: Reviewed history from 03/28/2008 and no changes required. mother diabetes drug related, cva ,  hepatitis father coad, chf, ?bysmnosis paternal aunt colon cancer paternal uncle colon cancer  Social History: Reviewed history from 02/21/2010 and no changes required. Retired Married Never Smoked Alcohol use-no Regular exercise-no  Review of  Systems       The patient complains of arthritis/joint pain, back pain, and fatigue.  The patient denies allergy/sinus, anemia, anxiety-new, blood in urine, breast changes/lumps, change in vision, confusion, cough, coughing up blood, depression-new, fainting, fever, headaches-new, hearing problems, heart murmur, heart rhythm changes, itching, menstrual pain, muscle pains/cramps, night sweats, nosebleeds, pregnancy symptoms, shortness of breath, skin rash, sleeping problems, sore throat, swelling of feet/legs, swollen lymph glands, thirst - excessive , urination - excessive , urination changes/pain, urine leakage, vision changes, and voice change.    Vital Signs:  Patient profile:   75 year old female Height:      66 inches Weight:      183 pounds BMI:     29.64 BSA:     1.93 Pulse rate:   76 / minute Pulse rhythm:   regular BP sitting:   110 / 68  (left arm) Cuff size:   regular  Vitals Entered By: Ok Anis CMA (Apr 09, 2010 1:51 PM)   Impression & Recommendations:  Problem # 1:  DYSPHAGIA (ICD-787.20) Assessment Improved Plan repeat dilatation p.r.n.  Problem # 2:  ESOPHAGEAL REFLUX (ICD-530.81) Symptoms are well-controlled with daily omeprazole.  She may be having side effects from the omeprazole.  Recommendations #1 hold omeprazole; antacids p.r.n.  Problem # 3:  SPECIAL SCREENING FOR MALIGNANT NEOPLASMS COLON (ICD-V76.51)  Plan screening colonoscopy  Risks, alternatives, and complications of the procedure, including bleeding, perforation, and possible need for surgery, were explained to the patient.  Patient's questions were answered.  Orders: Colonoscopy (Colon)  Patient Instructions: 1)  Copy sent to : William Hopper,MD 2)  Your colonoscopy is scheduled for 04/17/2010 at 2:30pm 3)  We are sending in your MoviPrep to your pharmacy 4)  The medication list was reviewed and reconciled.  All changed / newly prescribed medications were explained.  A complete medication  list was provided to the patient / caregiver. Prescriptions: MOVIPREP 100 GM  SOLR (PEG-KCL-NACL-NASULF-NA ASC-C) As per prep instructions.  #1 x 0   Entered by:   Merri Ray CMA (AAMA)   Authorized by:   Louis Meckel MD   Signed by:   Merri Ray CMA (AAMA) on 04/09/2010   Method used:   Electronically to        [facility]* (retail)       [address]       [place], [place]  [address]       Ph: [phone]       Fax: [phone]   RxID:   [id number]

## 2011-01-08 NOTE — Assessment & Plan Note (Signed)
Summary: b12//tl  Nurse Visit   Complete Medication List: 1)  Pravachol 40 Mg Tabs (Pravastatin sodium) .... Take 1 tablet at bedtime for cholesterol 2)  Fluoxetine Hcl 10 Mg Tabs (Fluoxetine hcl) .... Take 1 tablet at bedtime 3)  Lotemax 0.5 % Susp (Loteprednol etabonate) .... At bedtime 4)  Kls Allerclear 10 Mg Tabs (Loratadine) .... Take once daily 5)  Bl Glucosamine-chondroitin 500-400 Mg Tabs (Glucosamine-chondroitin) 6)  Synthroid 125 Mcg Tabs (Levothyroxine sodium) .Marland Kitchen.. 1 qam 7)  Fish Oil  8)  Cal With Vit D 600mg  I Po Qd  9)  Vit D 1000mg  I Qd  10)  Cranberry 1 Po Qd  11)  Tylenol As 2 Qd  12)  Asa 325 1 Po Qd  13)  Restful Legs 1 Qhs  14)  Baciguent 500 Unit/gm Oint (Bacitracin) .... Apply bid 15)  Keflex 500 Mg Caps (Cephalexin) .Marland Kitchen.. 1 bid 16)  Mirapex 0.25 Mg Tabs (Pramipexole dihydrochloride) .Marland Kitchen.. 1 qhs 17)  Vitamin B-12 Cr 1000 Mcg Tbcr (Cyanocobalamin) .Marland Kitchen.. 1000 micrograms weekly x 4 then monthly thereafter 18)  Levothroid 125 Mcg Tabs (Levothyroxine sodium) .... Take one tablet every morning    Prior Medications: PRAVACHOL 40 MG  TABS (PRAVASTATIN SODIUM) TAKE 1 TABLET AT BEDTIME FOR CHOLESTEROL FLUOXETINE HCL 10 MG  TABS (FLUOXETINE HCL) Take 1 tablet at bedtime LOTEMAX 0.5 %  SUSP (LOTEPREDNOL ETABONATE) at bedtime KLS ALLERCLEAR 10 MG  TABS (LORATADINE) Take once daily BL GLUCOSAMINE-CHONDROITIN 500-400 MG  TABS (GLUCOSAMINE-CHONDROITIN)  SYNTHROID 125 MCG  TABS (LEVOTHYROXINE SODIUM) 1 qam FISH OIL ()  CAL WITH VIT D 600MG  I PO QD ()  VIT D 1000MG  I QD ()  CRANBERRY 1 PO QD ()  TYLENOL AS 2 QD ()  ASA 325 1 PO QD ()  RESTFUL LEGS 1 QHS ()  BACIGUENT 500 UNIT/GM  OINT (BACITRACIN) apply bid KEFLEX 500 MG  CAPS (CEPHALEXIN) 1 bid MIRAPEX 0.25 MG  TABS (PRAMIPEXOLE DIHYDROCHLORIDE) 1 qhs VITAMIN B-12 CR 1000 MCG  TBCR (CYANOCOBALAMIN) 1000 micrograms weekly X 4 then monthly thereafter LEVOTHROID 125 MCG  TABS (LEVOTHYROXINE SODIUM) take one tablet every  morning Current Allergies: ! NEURONTIN ! MORPHINE    Medication Administration  Injection # 1:    Medication: Vit B12 1000 mcg    Diagnosis: RESTLESS LEG SYNDROME (ICD-333.94)    Route: IM    Site: L deltoid    Exp Date: 05/09/2009    Lot #: [id number]    Mfr: [employer]ICAN REGENT    Patient tolerated injection without complications    Given by: Floydene Flock (October 05, 2007 3:17 PM)  Orders Added: 1)  Vit B12 1000 mcg [J3420] 2)  Admin of Therapeutic Inj  intramuscular or subcutaneous Quintilian.Boros    ]  Medication Administration  Injection # 1:    Medication: Vit B12 1000 mcg    Diagnosis: RESTLESS LEG SYNDROME (ICD-333.94)    Route: IM    Site: L deltoid    Exp Date: 05/09/2009    Lot #: [id number]    Mfr: AMERICAN REGENT    Patient tolerated injection without complications    Given by: Floydene Flock (October 05, 2007 3:17 PM)  Orders Added: 1)  Vit B12 1000 mcg [J3420] 2)  Admin of Therapeutic Inj  intramuscular or subcutaneous [90772]

## 2011-01-08 NOTE — Progress Notes (Signed)
Summary: Sooner appt.  Phone Note Call from Patient Call back at [facility] Phone [phone]   Caller: Patient Call For: Dr. Arlyce Dice Reason for Call: Talk to Nurse Summary of Call: pt states she was to sch a f/u appt in 2-3 wks from procedure... nothing available that soon Initial call taken by: Vallarie Mare,  March 05, 2010 11:58 AM  Follow-up for Phone Call        Pt. will f/u with Dr.Kaplan on 03-28-10 at 9:30am. Pt. accepts this appt. She will continue Omeprazole daily. Pt. instructed to call back as needed.  Follow-up by: Laureen Ochs LPN,  March 05, 2010 12:10 PM     Appended Document: Sooner appt. Pt. appt is moved to 04-09-10 at 2pm, she is on vacation the week of 03-28-10.

## 2011-01-08 NOTE — Assessment & Plan Note (Signed)
Summary: DISCOMFORT IN THE CHEST AREA//PH   Vital Signs:  Patient profile:   75 year old female Weight:      184 pounds O2 Sat:      96 % on Room air Pulse rate:   76 / minute Resp:     17 per minute BP sitting:   120 / 80  (left arm)  Vitals Entered By: Doristine Devoid (February 09, 2010 12:07 PM)  O2 Flow:  Room air CC: chest pain and tightness x1 wk trouble swallowing large pills also indigestion worse and some nausea   CC:  chest pain and tightness x1 wk trouble swallowing large pills also indigestion worse and some nausea.  History of Present Illness:       This is a 75 year old female who presents with Chest pain intermittently since lobectomy for  nodule in context of Hashimoto's thyroiditis in 2007 ; constant X 1 week.  The patient reports resting chest pain, nausea, light headedness, and indigestion, but denies exertional chest pain, vomiting, diaphoresis, shortness of breath, palpitations, dizziness, and syncope.  The pain is described as constant and pressure-like.  The pain is located in the epigastric area and the pain does not radiate.  The pain is brought on or made worse by meals & large pills. She experiences some frank dysphagia; "food stops 1/2 down". The pain is NOT  relieved or improved with antacids and H2-blockers(Zantac 75 mg).  No Endo ; no  colonoscopy; "I just felt I never needed one"."Dr Gerrit Friends said I might have difficulty swallowing after thyroid surgery"  Allergies: 1)  ! Neurontin 2)  ! Morphine  Review of Systems General:  Denies weight loss. GI:  Denies abdominal pain, bloody stools, and dark tarry stools.  Physical Exam  General:  well-nourished,in no acute distress; alert,appropriate and cooperative throughout examination Eyes:  No corneal or conjunctival inflammation noted.Perrla. No icterus Mouth:  Oral mucosa and oropharynx without lesions or exudates.  No pharyngeal erythema.   Neck:  No deformities, masses, or tenderness noted.R lobe  absent Lungs:  Normal respiratory effort, chest expands symmetrically. Lungs are clear to auscultation, no crackles or wheezes. Heart:  Normal rate and regular rhythm. S1 and S2 normal without gallop, murmur, click, rub. S4 Abdomen:  Bowel sounds positive,abdomen soft and non-tender without masses, organomegaly or hernias noted. Pulses:  R and L carotid,radial,dorsalis pedis and posterior tibial pulses are full and equal bilaterally Skin:  Intact without suspicious lesions or rashes. No  jaundice Cervical Nodes:  No lymphadenopathy noted Axillary Nodes:  No palpable lymphadenopathy Psych:  memory intact for recent and remote, normally interactive, and good eye contact.     Impression & Recommendations:  Problem # 1:  DYSPHAGIA (ICD-787.20)  Probable esophageal stricture  Orders: EKG w/ Interpretation (93000) Prescription Created Electronically [phone]) Gastroenterology Referral (GI)  Problem # 2:  CHEST PAIN (ICD-786.50)  Epigastric   Orders: EKG w/ Interpretation (93000)  Complete Medication List: 1)  Pravachol 40 Mg Tabs (Pravastatin sodium) .... Take 1 tablet at bedtime for cholesterol 2)  Fluoxetine Hcl 10 Mg Tabs (Fluoxetine hcl) .... Take 1 tablet at bedtime 3)  Lotemax 0.5 % Susp (Loteprednol etabonate) .... 2 drops in eye at bedtime 4)  Osteo Bi-flex Triple Strength Tabs (Misc natural products) .... 2 tablet daily 5)  Fish Oil 1200 Mg Caps (Omega-3 fatty acids) .Marland Kitchen.. 1 by mouth once daily 6)  Vitamin D3 1000 Unit Tabs (Cholecalciferol) .Marland Kitchen.. 1 by mouth once daily 7)  Cranberry 1  Po Qd  8)  Levothroid 125 Mcg Tabs (Levothyroxine sodium) .... Take one tablet every morning 9)  Tylenol Arthritis Pain 650 Mg Cr-tabs (Acetaminophen) .... One tablet two times a day 10)  Allergy Relief 10 Mg Tabs (Loratadine) .Marland Kitchen.. 1 by mouth once daily 11)  Vitamin B-12 250 Mcg Tabs (Cyanocobalamin) .Marland Kitchen.. 1 by mouth once daily 12)  Multivitamins Tabs (Multiple vitamin) .Marland Kitchen.. 1 by mouth once  daily 13)  Clonazepam 0.5 Mg Tabs (Clonazepam) .... 1/2 - 1 at bedtime 14)  Vesicare 5 Mg Tabs (Solifenacin succinate) .... Take one tablet daily 15)  Omeprazole 20 Mg Cpdr (Omeprazole) .Marland Kitchen.. 1 pill 30 min pre b'fast & eve meal  Patient Instructions: 1)  Avoid foods high in acid (tomatoes, citrus juices, spicy foods). Avoid eating within two hours of lying down or before exercising. Do not over eat; try smaller more frequent meals. Elevate head of bed twelve inches when sleeping.Monitor stools ; report black /tarry stool or rectal bleeding. Prescriptions: OMEPRAZOLE 20 MG CPDR (OMEPRAZOLE) 1 pill 30 min pre b'fast & eve meal  #60 x 1   Entered and Authorized by:   Marga Melnick MD   Signed by:   Marga Melnick MD on 02/09/2010   Method used:   Faxed to ...       [facility] (retail)       [address]       [place], [place]  [address]       Ph: [phone]       Fax: [phone]   RxID:   [id number]

## 2011-01-08 NOTE — Procedures (Signed)
Summary: Upper Endoscopy  Patient: Tasha Moss Note: All result statuses are Final unless otherwise noted.  Tests: (1) Upper Endoscopy (EGD)   EGD Upper Endoscopy       DONE     [facility]     [address]     [place], [place]  [address]           ENDOSCOPY PROCEDURE REPORT           PATIENT:  Tasha Moss, Tasha Moss  MR#:  [id number]     BIRTHDATE:  1936/07/17, 74 yrs. old  GENDER:  female           ENDOSCOPIST:  Barbette Hair. Arlyce Dice, MD     Referred by:  Marga Melnick, M.D.           PROCEDURE DATE:  03/01/2010     PROCEDURE:  EGD with biopsy, Maloney Dilation of Esophagus     ASA CLASS:  Class II     INDICATIONS:  dysphagia           MEDICATIONS:   Fentanyl 50 mcg IV, Versed 4 mg IV, 0.6cc     simethancone 0.6 cc PO, glycopyrrolate (Robinal) 0.2 mg IV     TOPICAL ANESTHETIC:  Exactacain Spray           DESCRIPTION OF PROCEDURE:   After the risks benefits and     alternatives of the procedure were thoroughly explained, informed     consent was obtained.  The LB GIF-H180 [id number] endoscope was     introduced through the mouth and advanced to the third portion of     the duodenum, without limitations.  The instrument was slowly     withdrawn as the mucosa was fully examined.     <<PROCEDUREIMAGES>>           Moderate gastritis was found in the total stomach. Diffusely     injected (erythematous) mucosa. Bxs taken (see image2, image3,     image4, and image5).  A stone was found at the gastroesophageal     junction. Early esophageal stricture with few esophageal erosions     (see image6 and image7). Dilation with maloney dilator 18mm Mild     resistance; no heme  Otherwise the examination was normal.     Retroflexed views revealed no abnormalities.    The scope was then     withdrawn from the patient and the procedure completed.           COMPLICATIONS:  None           ENDOSCOPIC IMPRESSION:     1) Stricture at the gastroesophageal junction - s/p maloney     dilitation    2) Moderate gastritis in the total stomach     3) Otherwise normal examination     RECOMMENDATIONS:     1) Await pathology results     2) continue omeprazole     2) Call office next 2-3 days to schedule an office appointment     for 2-3 weeks           REPEAT EXAM:  No           ______________________________     Barbette Hair. Arlyce Dice, MD           CC:           n.     eSIGNED:   Barbette Hair. Kaplan at 03/01/2010 04:03 PM  Tasha Moss, Tasha Moss, [id number]  Note: An exclamation mark (!) indicates a result that was not dispersed into the flowsheet. Document Creation Date: 03/01/2010 4:03 PM _______________________________________________________________________  (1) Order result status: Final Collection or observation date-time: 03/01/2010 15:57 Requested date-time:  Receipt date-time:  Reported date-time:  Referring Physician:   Ordering Physician: Melvia Heaps [phone]) Specimen Source:  Source: Launa Grill Order Number: [id number] Lab site:

## 2011-01-08 NOTE — Progress Notes (Signed)
Summary: Return Call  Phone Note Call from Patient Call back at [phone]   Caller: Daughter Call For: Marga Melnick MD Summary of Call: Daughter needs you to contact her asap Initial call taken by: Lavell Islam,  July 20, 2010 2:44 PM  Follow-up for Phone Call        I called Mrs.Manrique to get her daughters name because the number that was left is a work number (operating room) and they need a full name in order to help.   I contacted Hillis Range and she mentioned that Dr.Keimari  already called her. **I will forward to Dr.Santosh Petter for documentation Follow-up by: Shonna Chock CMA,  July 20, 2010 3:11 PM  Additional Follow-up for Phone Call Additional follow up Details #1::        I gave her instructions for F/U ; to ER if no better Additional Follow-up by: Marga Melnick MD,  July 20, 2010 5:37 PM

## 2011-01-08 NOTE — Progress Notes (Signed)
Summary: MRI Results/ Still in pain  Phone Note Call from Patient Call back at Home Phone [phone]   Caller: Patient Details for Reason: MRI Results Summary of Call: Patient's husband called to get MRI Results: No definite explanation for L flank & abdominal pain  as changes are greater on RIGHT vs. LEFT. Dr Fredrich Birks assessment will be invaluable.   Mr.Barbaro would like to know what to do next,  patient is still in a lot of pain and no BM, patient would like a rx med to help with BM. Hinda Glatter CMA  July 20, 2010 9:05 AM   Follow-up for Phone Call        The next step is appt with Dr Venetia Maxon. GI should stay involved until the exact etiology of the pain is clearly defined. Miralax once daily as needed up to 4 days in a row Follow-up by: Marga Melnick MD,  July 20, 2010 10:08 AM  Additional Follow-up for Phone Call Additional follow up Details #1::        DISCUSS WITH PATIENT husband.................Marland KitchenFelecia Deloach CMA  July 20, 2010 10:41 AM

## 2011-01-08 NOTE — Assessment & Plan Note (Signed)
Summary: discuss labs//fd   Vital Signs:  Patient profile:   75 year old female Weight:      179.4 pounds Pulse rate:   76 / minute Resp:     16 per minute BP sitting:   122 / 70  (left arm) Cuff size:   large  Vitals Entered By: Shonna Chock (November 22, 2009 8:13 AM) CC: Follow-up visit: Discuss Labs and refill meds Comments REVIEWED MED LIST, PATIENT AGREED DOSE AND INSTRUCTION CORRECT    CC:  Follow-up visit: Discuss Labs and refill meds.  History of Present Illness: Labs reviewed ; lipids are @ goal. Triglycerides have decreased from 175 to 141; A1c values have always been in NON Diabetic range. She is restricting hyperglycemic carbs. Her major symptom is diffuse arthritis, especially in knees. Rx: Aleve twice a day, the second dose @ bedtime. Morphine had caused GI symptoms, no rash or associated fever. Old chart reviewed : B12 was 203 in 08/2007 & >1500 in 09/2008 after B12 shots . B12 was  586 in 05/2009. TSH was therapeutic.  Allergies: 1)  ! Neurontin 2)  ! Morphine  Review of Systems GI:  Denies abdominal pain, bloody stools, dark tarry stools, and indigestion; No symptoms with Aeve two times a day . MS:  Complains of joint pain and low back pain; denies joint redness and joint swelling.  Physical Exam  General:  in no acute distress; alert,appropriate and cooperative throughout examination Neck:  No deformities, masses, or tenderness noted. Lungs:  Normal respiratory effort, chest expands symmetrically. Lungs are clear to auscultation, no crackles or wheezes. Heart:  Normal rate and regular rhythm. S1 and S2 normal without gallop, murmur, click, rub. S4 Pulses:  R and L carotid,radial  pulses are full and equal bilaterally. Pedal pulses slightly decreased Extremities:  No clubbing, cyanosis, edema.Classic OA hand changes. Crepitus of knees. Using cane Neurologic:  alert & oriented X3.   Skin:  Intact without suspicious lesions or rashes Psych:  memory intact for  recent and remote, normally interactive, and good eye contact.     Impression & Recommendations:  Problem # 1:  HYPERLIPIDEMIA (ICD-272.2)  Her updated medication list for this problem includes:    Pravachol 40 Mg Tabs (Pravastatin sodium) .Marland Kitchen... Take 1 tablet at bedtime for cholesterol  Problem # 2:  PERNICIOUS ANEMIA (ICD-281.0) Corrected (Note: B12 level WNL but decreasing) Her updated medication list for this problem includes:    Vitamin B-12 250 Mcg Tabs (Cyanocobalamin) .Marland Kitchen... 1 by mouth once daily  Problem # 3:  HYPOTHYROIDISM (ICD-244.9) Corrected Her updated medication list for this problem includes:    Levothroid 125 Mcg Tabs (Levothyroxine sodium) .Marland Kitchen... Take one tablet every morning  Problem # 4:  ESSENTIAL HYPERTENSION (ICD-401.9) Controlled  Problem # 5:  DEGENERATIVE JOINT DISEASE (ICD-715.90)  Her updated medication list for this problem includes:    Ibuprofen 200 Mg Tabs (Ibuprofen) .Marland Kitchen... 1 by mouth once daily  Complete Medication List: 1)  Pravachol 40 Mg Tabs (Pravastatin sodium) .... Take 1 tablet at bedtime for cholesterol 2)  Fluoxetine Hcl 10 Mg Tabs (Fluoxetine hcl) .... Take 1 tablet at bedtime 3)  Lotemax 0.5 % Susp (Loteprednol etabonate) .... 2 drops in eye at bedtime 4)  Bl Glucosamine-chondroitin 500-400 Mg Tabs (Glucosamine-chondroitin) 5)  Fish Oil 1200 Mg Caps (Omega-3 fatty acids) .Marland Kitchen.. 1 by mouth once daily 6)  Vitamin D3 1000 Unit Tabs (Cholecalciferol) .Marland Kitchen.. 1 by mouth once daily 7)  Cranberry 1 Po Qd  8)  Levothroid 125 Mcg Tabs (Levothyroxine sodium) .... Take one tablet every morning 9)  Ibuprofen 200 Mg Tabs (Ibuprofen) .Marland Kitchen.. 1 by mouth once daily 10)  Allergy Relief 10 Mg Tabs (Loratadine) .Marland Kitchen.. 1 by mouth once daily 11)  Vitamin B-12 250 Mcg Tabs (Cyanocobalamin) .Marland Kitchen.. 1 by mouth once daily 12)  Multivitamins Tabs (Multiple vitamin) .Marland Kitchen.. 1 by mouth once daily 13)  Clonazepam 0.5 Mg Tabs (Clonazepam) .... 1/2 - 1 at bedtime 14)  Detrol 2 Mg  Tabs (Tolterodine tartrate) .Marland Kitchen.. 1 by mouth once daily  Patient Instructions: 1)  Please schedule a follow-up appointment in 6 months. 2)  BMP ;Hepatic Panel; B12 level; vitamin D level; 3)  Lipid Panel ;TSH;CBC w/ Diff. Avoid Aleve at bedtime ; use AS Tylenol. Prescriptions: FLUOXETINE HCL 10 MG  TABS (FLUOXETINE HCL) Take 1 tablet at bedtime  #90 x 1   Entered and Authorized by:   Marga Melnick MD   Signed by:   Marga Melnick MD on 11/22/2009   Method used:   Print then Give to Patient   RxID:   [id number] PRAVACHOL 40 MG  TABS (PRAVASTATIN SODIUM) TAKE 1 TABLET AT BEDTIME FOR CHOLESTEROL  #90 Each x 1   Entered and Authorized by:   Marga Melnick MD   Signed by:   Marga Melnick MD on 11/22/2009   Method used:   Print then Give to Patient   RxID:   [id number] CLONAZEPAM 0.5 MG TABS (CLONAZEPAM) 1/2 - 1 at bedtime  #90 x 1   Entered and Authorized by:   Marga Melnick MD   Signed by:   Marga Melnick MD on 11/22/2009   Method used:   Print then Give to Patient   RxID:   [id number] LEVOTHROID 125 MCG  TABS (LEVOTHYROXINE SODIUM) take one tablet every morning  #90 Each x 1   Entered and Authorized by:   Marga Melnick MD   Signed by:   Marga Melnick MD on 11/22/2009   Method used:   Print then Give to Patient   RxID:   [id number]

## 2011-01-08 NOTE — Assessment & Plan Note (Signed)
Summary: "FLANK PAIN"/KN   Vital Signs:  Patient profile:   75 year old female Weight:      179.4 pounds Temp:     98.6 degrees F oral Pulse rate:   76 / minute Resp:     17 per minute BP sitting:   110 / 60  (left arm) Cuff size:   large  Vitals Entered By: Shonna Chock CMA (July 18, 2010 9:19 AM) CC: Patient with left side stomach.abdominal pain. Patient was seen in the ER and seen GI doctor and the only DX: Flank pain, patient would like futher evalutaion. Patient with no BM since Friday 07/13/10, spitting up green mucous and has a foul taste in her mouth, Abdominal pain   Primary Care Provider:  Marga Melnick, MD   CC:  Patient with left side stomach.abdominal pain. Patient was seen in the ER and seen GI doctor and the only DX: Flank pain, patient would like futher evalutaion. Patient with no BM since Friday 07/13/10, spitting up green mucous and has a foul taste in her mouth, and Abdominal pain.  History of Present Illness: Abdominal Pain      This is a 75 year old woman who presents with Abdominal pain since 07/15/2010 .  The 07/16/2010 ER visit( copy provided her) & same day GI visit  were reviewed( lab copy provided). CT revealed only diverticulosis.The patient reports nausea( Zofran controls this), vomiting( last 08/08 about 2 pm), constipation( last BM 08/05 despite stool softeners), and anorexia, but denies diarrhea, melena, hematochezia, and hematemesis.  The location of the pain is left flank with radiation  into the upper quadrant.  The pain is described as constant, sharp, burning in quality, and radiating to the groin.  The patient denies the following symptoms: fever, dysuria, chest pain, jaundice, dark urine, and vaginal bleeding.  The pain is worse  supine  & in LLDP. It  is " tolerable"  with the pain medicine. PMH of renal calculi X1 & Spinal Stenosis L-S spine.  Current Medications (verified): 1)  Pravachol 40 Mg  Tabs (Pravastatin Sodium) .... Take 1 Tablet At Bedtime  For Cholesterol 2)  Fluoxetine Hcl 10 Mg Caps (Fluoxetine Hcl) .Marland Kitchen.. 1 By Mouth At Bedtime 3)  Lotemax 0.5 %  Susp (Loteprednol Etabonate) .... 2 Drops in Eye At Bedtime 4)  Osteo Bi-Flex Triple Strength  Tabs (Misc Natural Products) .... 2 Tablet Daily 5)  Fish Oil 1200 Mg Caps (Omega-3 Fatty Acids) .Marland Kitchen.. 1 By Mouth Once Daily 6)  Vitamin D3 1000 Unit Tabs (Cholecalciferol) .Marland Kitchen.. 1 By Mouth Once Daily 7)  Cranberry 1 Po Qd 8)  Levothroid 125 Mcg  Tabs (Levothyroxine Sodium) .... Take One Tablet Every Morning 9)  Tylenol Arthritis Pain 650 Mg Cr-Tabs (Acetaminophen) .... One Tablet Two Times A Day 10)  Allergy Relief 10 Mg Tabs (Loratadine) .Marland Kitchen.. 1 By Mouth Once Daily 11)  Vitamin B-12 250 Mcg Tabs (Cyanocobalamin) .Marland Kitchen.. 1 By Mouth Once Daily 12)  Multivitamins  Tabs (Multiple Vitamin) .Marland Kitchen.. 1 By Mouth Once Daily 13)  Clonazepam 0.5 Mg Tabs (Clonazepam) .... 1/2 - 1 At Bedtime 14)  Vesicare 5 Mg Tabs (Solifenacin Succinate) .... Take One Tablet Daily 15)  Hyomax-Sl 0.125 Mg Subl (Hyoscyamine Sulfate) .... Put 1 Under Tongue Every 4 Hours As Needed For Pain. 16)  Cipro 500 Mg Tabs (Ciprofloxacin Hcl) .Marland Kitchen.. 1 By Mouth Two Times A Day 17)  Tramadol Hcl 50 Mg Tabs (Tramadol Hcl) .Marland Kitchen.. 1 By Mouth Every 4-6 Hours As Needed  18)  Omeprazole 20 Mg Cpdr (Omeprazole) .Marland Kitchen.. 1 By Mouth Two Times A Day As Needed 19)  Ondansetron Hcl 4 Mg Tabs (Ondansetron Hcl) .... Take 1 Tab Every 6 Hours As Needed For Nausea 20)  Celebrex 50 Mg Caps (Celecoxib) .... Take 1 Tab Daily  Allergies: 1)  ! Neurontin 2)  ! Morphine  Past History:  Past Medical History: Temporary  memory loss 2006 SHOULDER PAIN, LEFT (ICD-719.41) ANEMIA NOS (ICD-285.9) RESTLESS LEG SYNDROME (ICD-333.94) HYPOTHYROIDISM (ICD-244.9) HYPERLIPIDEMIA (ICD-272.2) ADVEF, DRUG/MED/BIOL SUBST, ARTHUS PHENOMENON (ICD-995.21) HASHIMOTO'S THYROIDITIS (ICD-245.2), PMH of DEGENERATIVE JOINT DISEASE, HIPS (ICD-715.95) Diverticulosis, colon  Past Surgical  History: thyroid surgery for cyst L-S  spine disc surgery renal calculi X1 Tonsillectomy hip surgery left hip surgery right thyroid biopsy Hashimoto's thyroiditis lobectomy09/09/2006 Cataract Surgery Bilateral  Gastritis & esophageal stricture  @ Endoscopy, S/P dilation, Dr Arlyce Dice   Review of Systems ENT:  Hoarseness since N&V 08/08; some pill dysphagia. GU:  Denies discharge and hematuria. Derm:  Denies lesion(s) and rash. Neuro:  Denies numbness and tingling.  Physical Exam  General:  in no acute distress; alert,appropriate and cooperative throughout examination;uncomfortable-appearing.   Eyes:  No corneal or conjunctival inflammation noted. No icterus Mouth:  Oral mucosa and oropharynx without lesions or exudates.  Teeth in good repair.Uvulza bifed. No pharyngeal erythema.   Lungs:  Normal respiratory effort, chest expands symmetrically. Lungs are clear to auscultation, no crackles or wheezes. Heart:  normal rate, regular rhythm, no murmur, no gallop, no rub, no JVD, and no HJR.   Abdomen:  Bowel sounds positive,abdomen soft  but slightly tender  LLQ > LUQ without masses, organomegaly or hernias noted. Extremities:  No clubbing, cyanosis, edema. OA DIP deformities  noted with neg SLR Neurologic:  alert & oriented X3, strength normal in all extremities, gait  slow& broad based , and DTRs symmetrical and normal.   Skin:  Intact without suspicious lesions or rashes. No jaundice Cervical Nodes:  No lymphadenopathy noted Axillary Nodes:  No palpable lymphadenopathy Psych:  memory intact for recent and remote, normally interactive, and subdued.     Impression & Recommendations:  Problem # 1:  FLANK PAIN, LEFT (ICD-789.09)  Radicular pain suggested ; PMH of Spinal Stenosis & renal calculi. Note : PMH of  hallucinations with Gabapentin. MRI not possible due to THR bilaterally Her updated medication list for this problem includes:    Tylenol Arthritis Pain 650 Mg Cr-tabs  (Acetaminophen) ..... One tablet two times a day    Tramadol Hcl 50 Mg Tabs (Tramadol hcl) .Marland Kitchen... 1 by mouth every 4-6 hours as needed    Celebrex 50 Mg Caps (Celecoxib) .Marland Kitchen... Take 1 tab daily  Orders: Neurosurgeon Referral (Neurosurgeon)  Problem # 2:  ABDOMINAL PAIN, LEFT UPPER QUADRANT (ICD-789.02)  Tender to percussion LLQ > LUQ , ? from N&V  Problem # 3:  CONSTIPATION (ICD-564.00) ? med excacerbated  Complete Medication List: 1)  Pravachol 40 Mg Tabs (Pravastatin sodium) .... Take 1 tablet at bedtime for cholesterol 2)  Fluoxetine Hcl 10 Mg Caps (Fluoxetine hcl) .Marland Kitchen.. 1 by mouth at bedtime 3)  Lotemax 0.5 % Susp (Loteprednol etabonate) .... 2 drops in eye at bedtime 4)  Osteo Bi-flex Triple Strength Tabs (Misc natural products) .... 2 tablet daily 5)  Fish Oil 1200 Mg Caps (Omega-3 fatty acids) .Marland Kitchen.. 1 by mouth once daily 6)  Vitamin D3 1000 Unit Tabs (Cholecalciferol) .Marland Kitchen.. 1 by mouth once daily 7)  Cranberry 1 Po Qd  8)  Levothroid 125 Mcg  Tabs (Levothyroxine sodium) .... Take one tablet every morning 9)  Tylenol Arthritis Pain 650 Mg Cr-tabs (Acetaminophen) .... One tablet two times a day 10)  Allergy Relief 10 Mg Tabs (Loratadine) .Marland Kitchen.. 1 by mouth once daily 11)  Vitamin B-12 250 Mcg Tabs (Cyanocobalamin) .Marland Kitchen.. 1 by mouth once daily 12)  Multivitamins Tabs (Multiple vitamin) .Marland Kitchen.. 1 by mouth once daily 13)  Clonazepam 0.5 Mg Tabs (Clonazepam) .... 1/2 - 1 at bedtime 14)  Vesicare 5 Mg Tabs (Solifenacin succinate) .... Take one tablet daily 15)  Hyomax-sl 0.125 Mg Subl (Hyoscyamine sulfate) .... Put 1 under tongue every 4 hours as needed for pain. 16)  Cipro 500 Mg Tabs (Ciprofloxacin hcl) .Marland Kitchen.. 1 by mouth two times a day 17)  Tramadol Hcl 50 Mg Tabs (Tramadol hcl) .Marland Kitchen.. 1 by mouth every 4-6 hours as needed 18)  Omeprazole 20 Mg Cpdr (Omeprazole) .Marland Kitchen.. 1 by mouth two times a day as needed 19)  Ondansetron Hcl 4 Mg Tabs (Ondansetron hcl) .... Take 1 tab every 6 hours as needed for  nausea 20)  Celebrex 50 Mg Caps (Celecoxib) .... Take 1 tab daily  Patient Instructions: 1)  Miralax once daily as needed for constipation . Use Hyomax SL for abdominal pain as Rxed. Report if shingles appears in the  area of pain

## 2011-01-08 NOTE — Assessment & Plan Note (Signed)
Summary: review lab,cbs   Vital Signs:  Patient Profile:   75 Years Old Female Weight:      182.25 pounds Pulse rate:   60 / minute Pulse rhythm:   regular BP sitting:   132 / 60  (left arm) Cuff size:   large  Pt. in pain?   no  Vitals Entered By: Wendall Stade (March 28, 2008 9:21 AM)                  Chief Complaint:  follow up labs.  History of Present Illness: She has been off B12 shots X 1 month after 6 months of therapy; level is WNL (696; prev 203 pre shots). She is on PO vit B now. Thyroid & Lipid Management reviewed. Gyn survelliance being conducted. She has never had colonoscopy ; " I just never did it. I just don't want to do it" ( Standard of Care reviewed)  Lipid Management History:      Positive NCEP/ATP III risk factors include female age 77 years old or older and HDL cholesterol less than 40.  Negative NCEP/ATP III risk factors include no history of early menopause without estrogen hormone replacement, non-diabetic, no family history for ischemic heart disease, non-tobacco-user status, non-hypertensive, no ASHD (atherosclerotic heart disease), no prior stroke/TIA, no peripheral vascular disease, and no history of aortic aneurysm.       Current Allergies (reviewed today): ! NEURONTIN ! MORPHINE  Past Medical History:    Reviewed history from 09/08/2007 and no changes required:       Hypothyroidism       Temp memory loss 2006       Current Problems:        SKIN LESION (ICD-709.9)       SHOULDER PAIN, LEFT (ICD-719.41)       ANEMIA NOS (ICD-285.9)       RESTLESS LEG SYNDROME (ICD-333.94)       CELLULITIS, FACE (ICD-682.0)       HYPOTHYROIDISM (ICD-244.9)       HYPERLIPIDEMIA (ICD-272.2)       ADVEF, DRUG/MED/BIOL SUBST, ARTHUS PHENOMENON (ICD-995.21)       HYPERLIPIDEMIA NEC/NOS (ICD-272.4)       HASHIMOTO'S THYROIDITIS (ICD-245.2)       TONSILLECTOMY AND ADENOIDECTOMY, HX OF (ICD-V45.79)       DEGENERATIVE JOINT DISEASE, HIPS (ICD-715.95)  HYPOTHYROIDISM (ICD-244.9)  Past Surgical History:    thyroid surgery for cyst    l-s spine disc surgery    renal calculi    Tonsillectomy    hip surgery left    hip surgery right    thyroid biopsy    Hashimoto's thyroiditis lobectomy09/09/2006   Family History:    Reviewed history and no changes required:       mother diabetes drug related, cva , hepatitis       father coad, chf, ?bysmnosis       paternal aunt colon cancer       paternal uncle colon cancer  Social History:    Reviewed history and no changes required:       Never Smoked       Alcohol use-no       Regular exercise-no   Risk Factors:  Tobacco use:  never Alcohol use:  no Exercise:  no   Review of Systems  General      Complains of fatigue, malaise, and sleep disorder.      Denies chills, fever, sweats, and weight loss.  Restless leg syndrome  ENT      Complains of nasal congestion and sinus pressure.      From pollen  CV      Denies chest pain or discomfort, difficulty breathing at night, difficulty breathing while lying down, leg cramps with exertion, palpitations, and shortness of breath with exertion.  Resp      Denies cough, excessive snoring, hypersomnolence, morning headaches, and sputum productive.      No apnea  GI      Complains of indigestion.      Denies abdominal pain, bloody stools, and dark tarry stools.      Occa dyspepsia , as needed TUMS  GU      Denies discharge and hematuria.      Dr Josephina Shih, [facility]  MS      Complains of muscle weakness.      Denies low back pain.      Dr Charlett Blake did MRI: Lumbar DDD  with weakness  Derm      Denies changes in nail beds, dryness, and hair loss.  Neuro      Complains of numbness and weakness.      Denies brief paralysis and tingling.      Numbness in legs better with ambulation  Endo      Denies cold intolerance, excessive hunger, excessive thirst, excessive urination, heat intolerance, polyuria, and weight  change.  Allergy      Complains of seasonal allergies.      post nasal drainage from pollen   Physical Exam  General:     Well-developed,well-nourished,in no acute distress; alert,appropriate and cooperative throughout examination Neck:     No deformities, masses, or tenderness noted. Lungs:     Normal respiratory effort, chest expands symmetrically. Lungs are clear to auscultation, no crackles or wheezes. Heart:     Normal rate and regular rhythm. S1 and S2 normal without gallop, murmur, click, rub. S4 with slurring. Abdomen:     Bowel sounds positive,abdomen soft and non-tender without masses, organomegaly or hernias noted. Msk:     Lordosis. Well healed LS op scar Pulses:     R and L carotid,radial,dorsalis pedis and posterior tibial pulses are full and equal bilaterally Extremities:     DJD of hands. Fusiform R knee with crepitus. Susann Givens is deliberate , using cane Neurologic:     alert & oriented X3 and strength fair in all extremities and DTRs symmetrical and 1/2+ Skin:     Intact without suspicious lesions or rashes Cervical Nodes:     No lymphadenopathy noted Axillary Nodes:     No palpable lymphadenopathy Psych:     memory intact for recent and remote, normally interactive, good eye contact, not anxious appearing, and not depressed appearing.      Impression & Recommendations:  Problem # 1:  PERNICIOUS ANEMIA (ICD-281.0)  Her updated medication list for this problem includes:    Vitamin B-12 Cr 1000 Mcg Tbcr (Cyanocobalamin) .Marland KitchenMarland KitchenMarland KitchenMarland Kitchen 1000 micrograms weekly x 4 then monthly thereafter   Problem # 2:  RESTLESS LEG SYNDROME (ICD-333.94) ? due to B12 deficiency  Problem # 3:  HYPOTHYROIDISM (ICD-244.9)  The following medications were removed from the medication list:    Synthroid 125 Mcg Tabs (Levothyroxine sodium) .Marland Kitchen... 1 qam  Her updated medication list for this problem includes:    Levothroid 125 Mcg Tabs (Levothyroxine sodium) .Marland Kitchen... Take one tablet every  morning   Problem # 4:  HYPERLIPIDEMIA (ICD-272.2)  Her updated medication list for this  problem includes:    Pravachol 40 Mg Tabs (Pravastatin sodium) .Marland Kitchen... Take 1 tablet at bedtime for cholesterol   Complete Medication List: 1)  Pravachol 40 Mg Tabs (Pravastatin sodium) .... Take 1 tablet at bedtime for cholesterol 2)  Fluoxetine Hcl 10 Mg Tabs (Fluoxetine hcl) .... Take 1 tablet at bedtime 3)  Lotemax 0.5 % Susp (Loteprednol etabonate) .... At bedtime 4)  Kls Allerclear 10 Mg Tabs (Loratadine) .... Take once daily 5)  Bl Glucosamine-chondroitin 500-400 Mg Tabs (Glucosamine-chondroitin) 6)  Fish Oil  7)  Cal With Vit D 600mg  I Po Qd  8)  Vit D 1000mg  I Qd  9)  Cranberry 1 Po Qd  10)  Tylenol As 2 Qd  11)  Restful Legs 1 Qhs Prn  12)  Vitamin B-12 Cr 1000 Mcg Tbcr (Cyanocobalamin) .Marland Kitchen.. 1000 micrograms weekly x 4 then monthly thereafter 13)  Levothroid 125 Mcg Tabs (Levothyroxine sodium) .... Take one tablet every morning 14)  Ibuprophen   Lipid Assessment/Plan:      Based on NCEP/ATP III, the patient's risk factor category is "2 or more risk factors and a calculated 10 year CAD risk of < 20%".  From this information, the patient's calculated lipid goals are as follows: Total cholesterol goal is 200; LDL cholesterol goal is 130; HDL cholesterol goal is 40; Triglyceride goal is 150.  Her LDL cholesterol goal has been met.     Patient Instructions: 1)  Share these records with all physicians you see. Complete stool cards through  [facility]  office. Please consider 2)  scheduling  a colonoscopy as per Standard of Care as discussed.Monitor same labs annually. B12 level if symptoms recur or proogress. review HDL letter.    Prescriptions: LEVOTHROID 125 MCG  TABS (LEVOTHYROXINE SODIUM) take one tablet every morning  #90 x 3   Entered and Authorized by:   Marga Melnick MD   Signed by:   Marga Melnick MD on 03/28/2008   Method used:   Print then Give to Patient   RxID:    [id number] FLUOXETINE HCL 10 MG  TABS (FLUOXETINE HCL) Take 1 tablet at bedtime  #90 x 3   Entered and Authorized by:   Marga Melnick MD   Signed by:   Marga Melnick MD on 03/28/2008   Method used:   Print then Give to Patient   RxID:   [id number] PRAVACHOL 40 MG  TABS (PRAVASTATIN SODIUM) TAKE 1 TABLET AT BEDTIME FOR CHOLESTEROL  #90 x 3   Entered and Authorized by:   Marga Melnick MD   Signed by:   Marga Melnick MD on 03/28/2008   Method used:   Print then Give to Patient   RxID:   [id number]  ]

## 2011-01-08 NOTE — Consult Note (Signed)
Summary: [facility]  [facility]   Imported By: Lanelle Bal 08/14/2010 10:20:39  _____________________________________________________________________  External Attachment:    Type:   Image     Comment:   External Document

## 2011-01-08 NOTE — Letter (Signed)
Summary: [facility] Instructions  [facility]  [address] [place], [place] [address]   Phone: [phone]  Fax: [phone]       [address]    [birth year]    MRN: [id number]        Procedure Day /Date:TUESDAY 04/17/2010     Arrival Time:1:30PM     Procedure Time:2:30PM     Location of Procedure:                    X   [facility] (4th Floor)   PREPARATION FOR COLONOSCOPY WITH MOVIPREP   Starting 5 days prior to your procedure 04/12/2010 do not eat nuts, seeds, popcorn, corn, beans, peas,  salads, or any raw vegetables.  Do not take any fiber supplements (e.g. Metamucil, Citrucel, and Benefiber).  THE DAY BEFORE YOUR PROCEDURE         DATE: 04/16/2010  DAY: MONDAY  1.  Drink clear liquids the entire day-NO SOLID FOOD  2.  Do not drink anything colored red or purple.  Avoid juices with pulp.  No orange juice.  3.  Drink at least 64 oz. (8 glasses) of fluid/clear liquids during the day to prevent dehydration and help the prep work efficiently.  CLEAR LIQUIDS INCLUDE: Water Jello Ice Popsicles Tea (sugar ok, no milk/cream) Powdered fruit flavored drinks Coffee (sugar ok, no milk/cream) Gatorade Juice: apple, white grape, white cranberry  Lemonade Clear bullion, consomm, broth Carbonated beverages (any kind) Strained chicken noodle soup Hard Candy                             4.  In the morning, mix first dose of MoviPrep solution:    Empty 1 Pouch A and 1 Pouch B into the disposable container    Add lukewarm drinking water to the top line of the container. Mix to dissolve    Refrigerate (mixed solution should be used within 24 hrs)  5.  Begin drinking the prep at 5:00 p.m. The MoviPrep container is divided by 4 marks.   Every 15 minutes drink the solution down to the next mark (approximately 8 oz) until the full liter is complete.   6.  Follow completed prep with 16 oz of clear liquid of your choice (Nothing red or purple).  Continue  to drink clear liquids until bedtime.  7.  Before going to bed, mix second dose of MoviPrep solution:    Empty 1 Pouch A and 1 Pouch B into the disposable container    Add lukewarm drinking water to the top line of the container. Mix to dissolve    Refrigerate  THE DAY OF YOUR PROCEDURE      DATE:04/17/2010 GMW:NUUVOZD  Beginning at9:30a.m. (5 hours before procedure):         1. Every 15 minutes, drink the solution down to the next mark (approx 8 oz) until the full liter is complete.  2. Follow completed prep with 16 oz. of clear liquid of your choice.    3. You may drink clear liquids until 12:30PM (2 HOURS BEFORE PROCEDURE).   MEDICATION INSTRUCTIONS  Unless otherwise instructed, you should take regular prescription medications with a small sip of water   as early as possible the morning of your procedure.          OTHER INSTRUCTIONS  You will need a responsible adult at least 73 years of age to accompany you and drive  you home.   This person must remain in the waiting room during your procedure.  Wear loose fitting clothing that is easily removed.  Leave jewelry and other valuables at home.  However, you may wish to bring a book to read or  an iPod/MP3 player to listen to music as you wait for your procedure to start.  Remove all body piercing jewelry and leave at home.  Total time from sign-in until discharge is approximately 2-3 hours.  You should go home directly after your procedure and rest.  You can resume normal activities the  day after your procedure.  The day of your procedure you should not:   Drive   Make legal decisions   Operate machinery   Drink alcohol   Return to work  You will receive specific instructions about eating, activities and medications before you leave.    The above instructions have been reviewed and explained to me by   _______________________    I fully understand and can verbalize these instructions  _____________________________ Date _________

## 2011-01-08 NOTE — Letter (Signed)
Summary: Results Letter  [facility]  [address] [place], [place] [address]   Phone: [phone]  Fax: [phone]        February 21, 2010 MRN: [id number]    Tasha Moss [address] [place], [place]  [address]    Dear Tasha Moss,  It is my pleasure to have treated you recently as a new patient in my office. I appreciate your confidence and the opportunity to participate in your care.  Since I do have a busy inpatient endoscopy schedule and office schedule, my office hours vary weekly. I am, however, available for emergency calls everyday through my office. If I am not available for an urgent office appointment, another one of our gastroenterologist will be able to assist you.  My well-trained staff are prepared to help you at all times. For emergencies after office hours, a physician from our Gastroenterology section is always available through my 24 hour answering service  Once again I welcome you as a new patient and I look forward to a happy and healthy relationship             Sincerely,  Louis Meckel MD  This letter has been electronically signed by your physician.  Appended Document: Results Letter letter mailed

## 2011-01-08 NOTE — Assessment & Plan Note (Signed)
Summary: b12//tl  Nurse Visit    Prior Medications: PRAVACHOL 40 MG  TABS (PRAVASTATIN SODIUM) TAKE 1 TABLET AT BEDTIME FOR CHOLESTEROL FLUOXETINE HCL 10 MG  TABS (FLUOXETINE HCL) Take 1 tablet at bedtime LOTEMAX 0.5 %  SUSP (LOTEPREDNOL ETABONATE) at bedtime KLS ALLERCLEAR 10 MG  TABS (LORATADINE) Take once daily BL GLUCOSAMINE-CHONDROITIN 500-400 MG  TABS (GLUCOSAMINE-CHONDROITIN)  SYNTHROID 125 MCG  TABS (LEVOTHYROXINE SODIUM) 1 qam FISH OIL ()  CAL WITH VIT D 600MG  I PO QD ()  VIT D 1000MG  I QD ()  CRANBERRY 1 PO QD ()  TYLENOL AS 2 QD ()  ASA 325 1 PO QD ()  RESTFUL LEGS 1 QHS ()  BACIGUENT 500 UNIT/GM  OINT (BACITRACIN) apply bid KEFLEX 500 MG  CAPS (CEPHALEXIN) 1 bid MIRAPEX 0.25 MG  TABS (PRAMIPEXOLE DIHYDROCHLORIDE) 1 qhs VITAMIN B-12 CR 1000 MCG  TBCR (CYANOCOBALAMIN) 1000 micrograms weekly X 4 then monthly thereafter LEVOTHROID 125 MCG  TABS (LEVOTHYROXINE SODIUM) take one tablet every morning Current Allergies: ! NEURONTIN ! MORPHINE    Medication Administration  Injection # 1:    Medication: Vit B12 1000 mcg    Diagnosis: ANEMIA NOS (ICD-285.9)    Route: IM    Site: R deltoid    Exp Date: 05/09/2009    Lot #: [id number]    Mfr: AMERICAN REGENTS    Given by: Doristine Devoid (October 12, 2007 3:12 PM)  Orders Added: 1)  Vit B12 1000 mcg [J3420] 2)  Admin of Therapeutic Inj  intramuscular or subcutaneous Quintilian.Boros    ]  Medication Administration  Injection # 1:    Medication: Vit B12 1000 mcg    Diagnosis: ANEMIA NOS (ICD-285.9)    Route: IM    Site: R deltoid    Exp Date: 05/09/2009    Lot #: [id number]    Mfr: AMERICAN REGENTS    Given by: Doristine Devoid (October 12, 2007 3:12 PM)  Orders Added: 1)  Vit B12 1000 mcg [J3420] 2)  Admin of Therapeutic Inj  intramuscular or subcutaneous [90772]

## 2011-01-08 NOTE — Progress Notes (Signed)
Summary: refill fluoxetine hcl - dr hopper  Phone Note Refill Request   Refills Requested: Medication #1:  FLUOXETINE HCL 10 MG  TABS Take 1 tablet at bedtime   Last Refilled: [id number] received fax from [facility] [address] [place] - fax [phone] --- ph [phone] --- copy of fax to [facility]  Initial call taken by: Okey Regal [place],  December 23, 2007 11:38 AM      Prescriptions: FLUOXETINE HCL 10 MG  TABS (FLUOXETINE HCL) Take 1 tablet at bedtime  #30 x 0   Entered by:   Doristine Devoid   Authorized by:   Marga Melnick MD   Signed by:   Doristine Devoid on 12/23/2007   Method used:   Historical   RxID:   [id number]

## 2011-01-08 NOTE — Letter (Signed)
Summary: [facility]   Imported By: Freddy Jaksch 02/05/2008 10:31:22  _____________________________________________________________________  External Attachment:    Type:   Image     Comment:   External Document

## 2011-01-08 NOTE — Letter (Signed)
Summary: Appt Reminder 2  [facility]  [address] [place], [place] [address]   Phone: [phone]  Fax: [phone]        March 05, 2010 MRN: [id number]    ANSLIE SPADAFORA [address] [place], [place]  [address]    Dear Ms. DEMARINIS,   You have a return appointment with Dr.Robert Arlyce Dice on 04-09-10 at 2pm. Please remember to bring a complete list of the medicines you are taking, your insurance card and your co-pay.  If you have to cancel or reschedule this appointment, please call before 5:00 pm the evening before to avoid a cancellation fee.  If you have any questions or concerns, please call [phone].    Sincerely,    Laureen Ochs LPN  Appended Document: Appt Reminder 2 Letter mailed to patient.

## 2011-01-08 NOTE — Progress Notes (Signed)
Summary: Triage  Phone Note Call from Patient Call back at Home Phone [phone]   Caller: Patient Call For: Dr. Arlyce Dice Reason for Call: Talk to Nurse Summary of Call: pt. is hurting in the chest/rib area wants to know if something can be prescibed Initial call taken by: Karna Christmas,  May 03, 2010 9:35 AM  Follow-up for Phone Call        Last OV 04-09-10, thought she had side effects from Omeprazole, was instructed to hold Omperazole and use antacids as needed.  Pt. now C/O intermittent pain in LUQ/epigastric area/shoulder blades and back, "It feels like it goes through me" Some heartburn, increased bloating.  Denies n/v, fever.  Uses OTC antacid, some relief.   [facility] PLEASE ADVISE  Follow-up by: Laureen Ochs LPN,  May 03, 2010 10:09 AM  Additional Follow-up for Phone Call Additional follow up Details #1::        Hyomax 0.125mg  s.l. q4h as needed  Additional Follow-up by: Louis Meckel MD,  May 03, 2010 11:27 AM    Additional Follow-up for Phone Call Additional follow up Details #2::    Above MD orders reviewed with patient. Script to her pharmacy. She will call with an update next week, sooner if needed. Follow-up by: Laureen Ochs LPN,  May 03, 2010 12:04 PM  New/Updated Medications: HYOMAX-SL 0.125 MG SUBL (HYOSCYAMINE SULFATE) Put 1 under tongue every 4 hours as needed for pain. Prescriptions: HYOMAX-SL 0.125 MG SUBL (HYOSCYAMINE SULFATE) Put 1 under tongue every 4 hours as needed for pain.  #30 x 1   Entered by:   Laureen Ochs LPN   Authorized by:   Louis Meckel MD   Signed by:   Laureen Ochs LPN on 09/81/1914   Method used:   Electronically to        [facility]  [address] (retail)       [address]       [place], [place]  [address]       Ph: [phone]       Fax: [phone]   RxID:   [id number]

## 2011-01-08 NOTE — Letter (Signed)
Summary: EGD Instructions  [facility]  [address] [place], [place] [address]   Phone: [phone]  Fax: [phone]       Tasha Moss    [birth year]    MRN: [id number]       Procedure Day /Date:THURSDAY 03/01/2010     Arrival Time: 2:30PM     Procedure Time:3:30PM     Location of Procedure:                    X  [facility] (4th Floor)   PREPARATION FOR ENDOSCOPY/dil   On 03/01/2010  THE DAY OF THE PROCEDURE:  1.   No solid foods, milk or milk products are allowed after midnight the night before your procedure.  2.   Do not drink anything colored red or purple.  Avoid juices with pulp.  No orange juice.  3.  You may drink clear liquids until 03/01/2010  which is 2 hours before your procedure.                                                                                                CLEAR LIQUIDS INCLUDE: Water Jello Ice Popsicles Tea (sugar ok, no milk/cream) Powdered fruit flavored drinks Coffee (sugar ok, no milk/cream) Gatorade Juice: apple, white grape, white cranberry  Lemonade Clear bullion, consomm, broth Carbonated beverages (any kind) Strained chicken noodle soup Hard Candy   MEDICATION INSTRUCTIONS  Unless otherwise instructed, you should take regular prescription medications with a small sip of water as early as possible the morning of your procedure.            OTHER INSTRUCTIONS  You will need a responsible adult at least 73 years of age to accompany you and drive you home.   This person must remain in the waiting room during your procedure.  Wear loose fitting clothing that is easily removed.  Leave jewelry and other valuables at home.  However, you may wish to bring a book to read or an iPod/MP3 player to listen to music as you wait for your procedure to start.  Remove all body piercing jewelry and leave at home.  Total time from sign-in until discharge is approximately 2-3 hours.  You should go home  directly after your procedure and rest.  You can resume normal activities the day after your procedure.  The day of your procedure you should not:   Drive   Make legal decisions   Operate machinery   Drink alcohol   Return to work  You will receive specific instructions about eating, activities and medications before you leave.    The above instructions have been reviewed and explained to me by   _______________________    I fully understand and can verbalize these instructions _____________________________ Date _________

## 2011-01-08 NOTE — Letter (Signed)
Summary: [facility]   Imported By: Lanelle Bal 02/15/2010 10:28:59  _____________________________________________________________________  External Attachment:    Type:   Image     Comment:   External Document

## 2011-01-08 NOTE — Letter (Signed)
Summary: [facility]   Imported By: Freddy Jaksch 03/23/2008 11:33:17  _____________________________________________________________________  External Attachment:    Type:   Image     Comment:   External Document

## 2011-01-08 NOTE — Progress Notes (Signed)
Summary: refill  Phone Note Refill Request Message from:  Pharmacy on [facility] [phone]  Refills Requested: Medication #1:  FLUOXETINE HCL 10 MG  TABS Take 1 tablet at bedtime Initial call taken by: Barb Merino,  June 21, 2009 1:39 PM    Prescriptions: FLUOXETINE HCL 10 MG  TABS (FLUOXETINE HCL) Take 1 tablet at bedtime  #90 x 1   Entered by:   Kandice Hams   Authorized by:   Marga Melnick MD   Signed by:   Kandice Hams on 06/21/2009   Method used:   Faxed to ...       [facility] (retail)       [address]       [place], [place]  [address]       Ph: [phone]       Fax: [phone]   RxID:   [id number]

## 2011-01-08 NOTE — Progress Notes (Signed)
Summary: Wrong pharmacy  Phone Note Call from Patient Call back at Home Phone [phone]   Call For: Dr Arlyce Dice Summary of Call: Prescription for prep kit sent to wrong pharmacy. Uses [facility] in [place] Initial call taken by: Leanor Kail [facility],  Apr 11, 2010 2:09 PM  Follow-up for Phone Call        Called pt to inform resent MoviPrep L/M Follow-up by: Merri Ray CMA Duncan Dull),  Apr 11, 2010 2:20 PM    New/Updated Medications: MOVIPREP 100 GM  SOLR (PEG-KCL-NACL-NASULF-NA ASC-C) As per prep instructions. Prescriptions: MOVIPREP 100 GM  SOLR (PEG-KCL-NACL-NASULF-NA ASC-C) As per prep instructions.  #1 x 0   Entered by:   Merri Ray CMA (AAMA)   Authorized by:   Louis Meckel MD   Signed by:   Merri Ray CMA (AAMA) on 04/11/2010   Method used:   Electronically to        [facility]  [address] (retail)       [address]       [place], [place]  [address]       Ph: [phone]       Fax: [phone]   RxID:   [id number]

## 2011-01-08 NOTE — Progress Notes (Signed)
Summary: REFILL FOR FLUOXETINE AND PRAVASTATIN  Phone Note Refill Request   Refills Requested: Medication #1:  FLUOXETINE HCL 10 MG  TABS Take 1 tablet at bedtime   Last Refilled: 12/23/2007  Medication #2:  PRAVACHOL 40 MG  TABS TAKE 1 TABLET AT BEDTIME FOR CHOLESTEROL   Last Refilled: 12/23/2007 RX RECEIVED VIA FAX FROM [facility] FAX IS [phone]  Initial call taken by: Job Founds,  January 20, 2008 12:34 PM      Prescriptions: FLUOXETINE HCL 10 MG  TABS (FLUOXETINE HCL) Take 1 tablet at bedtime  #30 x 1   Entered by:   Shonna Chock   Authorized by:   Marga Melnick MD   Signed by:   Shonna Chock on 01/20/2008   Method used:   Electronically sent to ...       [facility]*       [address]       [place], [place]  [address]       Ph: [phone]       Fax: [phone]   RxID:   [id number] PRAVACHOL 40 MG  TABS (PRAVASTATIN SODIUM) TAKE 1 TABLET AT BEDTIME FOR CHOLESTEROL  #30 x 5   Entered by:   Shonna Chock   Authorized by:   Marga Melnick MD   Signed by:   Shonna Chock on 01/20/2008   Method used:   Electronically sent to ...       [facility]*       [address]       [place], [place]  [address]       Ph: [phone]       Fax: [phone]   RxID:   [id number]

## 2011-01-08 NOTE — Letter (Signed)
Summary: New Patient letter  [facility]  [address] [place], [place] [address]   Phone: [phone]  Fax: [phone]       02/13/2010 MRN: [id number]  Tasha Moss [address] [place], [place]  [address]  Dear Ms. Ladona Ridgel,  Welcome to the Gastroenterology Division at [facility].    You are scheduled to see Dr. Arlyce Dice on 02-21-10 at 11:30a.m. on the 3rd floor at [facility], [address]  We ask that you try to arrive at our office 15 minutes prior to your appointment time to allow for check-in.  We would like you to complete the enclosed self-administered evaluation form prior to your visit and bring it with you on the day of your appointment.  We will review it with you.  Also, please bring a complete list of all your medications or, if you prefer, bring the medication bottles and we will list them.  Please bring your insurance card so that we may make a copy of it.  If your insurance requires a referral to see a specialist, please bring your referral form from your primary care physician.  Co-payments are due at the time of your visit and may be paid by cash, check or credit card.     Your office visit will consist of a consult with your physician (includes a physical exam), any laboratory testing he/she may order, scheduling of any necessary diagnostic testing (e.g. x-ray, ultrasound, CT-scan), and scheduling of a procedure (e.g. Endoscopy, Colonoscopy) if required.  Please allow enough time on your schedule to allow for any/all of these possibilities.    If you cannot keep your appointment, please call [phone] to cancel or reschedule prior to your appointment date.  This allows [place] the opportunity to schedule an appointment for another patient in need of care.  If you do not cancel or reschedule by 5 p.m. the business day prior to your appointment date, you will be charged a $50.00 late cancellation/no-show fee.    Thank you for choosing  [facility] for your medical needs.  We appreciate the opportunity to care for you.  Please visit [place] at our website  to learn more about our practice.                     Sincerely,                                                             The Gastroenterology Division

## 2011-01-08 NOTE — Progress Notes (Signed)
Summary: TRIAGE  ---- Converted from flag ---- ---- 07/15/2010 4:09 PM, Hart Carwin MD wrote: Stanton Kidney, Mrs Frede called with LLQ abd. pain x 6 hours, not subsiding. Suspect diverticulitis( had colon 04/2010). I have called her in Cipro 500mg  by mouth two times a day # 14, and tramadol 50 mg, #20, 1 by mouth q 4-6 hrs as needed pain. Liquid diet. Please call her to work her into Dr Marzetta Board schedule. Thanx ------------------------------  Phone Note Outgoing Call   Call placed by: Laureen Ochs LPN,  July 16, 2010 8:02 AM Call placed to: Patient Summary of Call: Pt. states she doesn't feel better, she actually went to [facility] ER last night. She states ER MD told her it may be diverticulitis or shingles. Pain is in her left back and comes around to the front and nausea. Some constipation. Denies blood, black stools, vomiting, fever, diarrhea. Last colon was 04-17-10 and last endo. was 03-01-10.  [facility] PLEASE REVIEW AND ADVISE  Initial call taken by: Laureen Ochs LPN,  July 16, 2010 8:05 AM  Follow-up for Phone Call        needs OV Follow-up by: Louis Meckel MD,  July 16, 2010 9:03 AM  Additional Follow-up for Phone Call Additional follow up Details #1::        Pt. will see Willette Cluster NP this morning at 11am. Additional Follow-up by: Laureen Ochs LPN,  July 16, 2010 9:17 AM

## 2011-01-10 NOTE — Medication Information (Signed)
Summary: Denial for Clonazepam/[facility]  Denial for Clonazepam/[facility]   Imported By: Lanelle Bal 12/07/2010 11:18:46  _____________________________________________________________________  External Attachment:    Type:   Image     Comment:   External Document

## 2011-01-10 NOTE — Progress Notes (Signed)
Summary: Refill Request  Phone Note Refill Request Call back at [phone] Message from:  Patient  Refills Requested: Medication #1:  CLONAZEPAM 0.5 MG TABS 1/2 - 1 at bedtime [facility] [place]--[phone]  Initial call taken by: Freddy Jaksch,  November 22, 2010 12:18 PM    Prescriptions: CLONAZEPAM 0.5 MG TABS (CLONAZEPAM) 1/2 - 1 at bedtime  #90 x 0   Entered by:   Shonna Chock CMA   Authorized by:   Marga Melnick MD   Signed by:   Shonna Chock CMA on 11/22/2010   Method used:   Printed then faxed to ...       [facility])       [address]       [place], [place]  [address]       Ph: [phone]       Fax: [phone]   RxID:   [id number]

## 2011-01-10 NOTE — Progress Notes (Signed)
Summary: Clonazepam denial  Phone Note Other Incoming   Summary of Call: The office received denial of Clonazepam, stating that "the requested drug or cost-share reduction is excluded under the Medicare prescription drug coverage and is not covered by your plan". It is in a class called Benzodiazepines, and that class is excluded from coverage under Medicare rules. Please advise. Initial call taken by: Lucious Groves CMA,  November 28, 2010 3:17 PM  Follow-up for Phone Call        her plan won't cover this; she must confer with her plan as to covered options Follow-up by: Marga Melnick MD,  November 28, 2010 3:24 PM  Additional Follow-up for Phone Call Additional follow up Details #1::        Patient notified.  Additional Follow-up by: Lucious Groves CMA,  November 29, 2010 11:22 AM

## 2011-01-11 ENCOUNTER — Other Ambulatory Visit: Payer: Self-pay | Admitting: Internal Medicine

## 2011-01-11 ENCOUNTER — Encounter (INDEPENDENT_AMBULATORY_CARE_PROVIDER_SITE_OTHER): Payer: Self-pay | Admitting: *Deleted

## 2011-01-11 ENCOUNTER — Other Ambulatory Visit (INDEPENDENT_AMBULATORY_CARE_PROVIDER_SITE_OTHER): Payer: Medicare Other

## 2011-01-11 DIAGNOSIS — E039 Hypothyroidism, unspecified: Secondary | ICD-10-CM

## 2011-01-11 DIAGNOSIS — E8881 Metabolic syndrome: Secondary | ICD-10-CM

## 2011-01-11 DIAGNOSIS — Z79899 Other long term (current) drug therapy: Secondary | ICD-10-CM

## 2011-01-11 LAB — LIPID PANEL
Cholesterol: 156 mg/dL (ref 0–200)
HDL: 36.7 mg/dL — ABNORMAL LOW (ref >39.00)
LDL Cholesterol: 97 mg/dL (ref 0–99)
Total CHOL/HDL Ratio: 4
Triglycerides: 112 mg/dL (ref 0.0–149.0)
VLDL: 22.4 mg/dL (ref 0.0–40.0)

## 2011-01-11 LAB — BUN: BUN: 21 mg/dL (ref 6–23)

## 2011-01-11 LAB — TSH: TSH: 3.28 u[IU]/mL (ref 0.35–5.50)

## 2011-01-11 LAB — POTASSIUM: Potassium: 4.2 mEq/L (ref 3.5–5.1)

## 2011-01-11 LAB — CREATININE, SERUM: Creatinine, Ser: 0.7 mg/dL (ref 0.4–1.2)

## 2011-01-11 LAB — HEMOGLOBIN A1C: Hgb A1c MFr Bld: 5.4 % (ref 4.6–6.5)

## 2011-01-11 NOTE — Miscellaneous (Signed)
Summary: Discharge/[facility]  Discharge/[facility]   Imported By: Lanelle Bal 07/26/2009 10:39:04  _____________________________________________________________________  External Attachment:    Type:   Image     Comment:   External Document

## 2011-02-22 LAB — URINALYSIS, ROUTINE W REFLEX MICROSCOPIC
Bilirubin Urine: NEGATIVE
Glucose, UA: NEGATIVE mg/dL
Hgb urine dipstick: NEGATIVE
Ketones, ur: 15 mg/dL — AB
Nitrite: NEGATIVE
Protein, ur: NEGATIVE mg/dL
Specific Gravity, Urine: 1.017 (ref 1.005–1.030)
Urobilinogen, UA: 0.2 mg/dL (ref 0.0–1.0)
pH: 5 (ref 5.0–8.0)

## 2011-02-22 LAB — URINE MICROSCOPIC-ADD ON

## 2011-03-19 ENCOUNTER — Ambulatory Visit (INDEPENDENT_AMBULATORY_CARE_PROVIDER_SITE_OTHER): Payer: Medicare Other | Admitting: Internal Medicine

## 2011-03-19 ENCOUNTER — Encounter: Payer: Self-pay | Admitting: Internal Medicine

## 2011-03-19 VITALS — BP 128/70 | HR 76 | Temp 98.2°F | Wt 184.4 lb

## 2011-03-19 DIAGNOSIS — M1990 Unspecified osteoarthritis, unspecified site: Secondary | ICD-10-CM

## 2011-03-19 DIAGNOSIS — M7120 Synovial cyst of popliteal space [Baker], unspecified knee: Secondary | ICD-10-CM

## 2011-03-19 DIAGNOSIS — R609 Edema, unspecified: Secondary | ICD-10-CM

## 2011-03-19 NOTE — Patient Instructions (Signed)
Please keep your  legs elevated as much  as you can until  the swelling resolves. Use  Celebrex twice a day as needed. If the swelling persists  or  Progresses,  please call. Labs and imaging would then  be done; clinically these are  not indicated at this time based on history and physical.

## 2011-03-19 NOTE — Progress Notes (Signed)
  Subjective:    Patient ID: Tasha Moss, female    DOB: [birth year], 75 y.o.   MRN: [id number]  HPI Edema X 6 days ( onset 04/04)  from knees down ; progressive , peaked on  04/07 but improved slightly  since  w/o treatment. No specific trigger  such as increased sodium in diet. Associated is numbness , tingling & burning in ankles & feet.  They were on beach camping trip 03/31 to 04/06 Constitutional: Fever no; chills no; change in weight no; fatigue no. Eyes: Blurred/double/loss of vision . Cardiovascular: Chest pain no; palpitations no; racing no; irregular heart rhythm no ; syncope no; nausea no; diaphoresis no; claudication no; paroxysmal nocturnal dyspnea no Respirations: Cough no; sputum  no; dyspnea no; wheezing no. GI:  abdominal pain no; bloating no; dysphagia with pills only GU: Dysuria no; hematuria no; nocturia no Musculoskeletal:joint swelling w/o redness in  knees Skin:  rash no;  lesions no Endocrine:  change in hair, skin, skin, nails no; polyuria, polydipsia; polyphasia no;  intolerance to heat/cold no Hematologic/lymphatic: Abnormal bruising or bleeding no; enlarged lymph nodes no    Review of Systems She has chronic knee pain ; "I'm used to it". It was no worse @ beach.     Objective:   Physical Exam on exam she's in no acute distress.  She has no scleral icterus. She has prominent globes  but no definite exophthalmos. No lid lag is noted; extraocular motions intact.  She exhibits possible minimal rales at the bases but there is no increased work of breathing.  She has a slow S4.  There is no neck vein distention at 10. There is no hepatojugular reflux  Abdomen soft without organomegaly or masses.  Subcutaneous changes are noted of the knees. She has 1/2+ pedal edema  All pulses are intact.  Homans sign is negative but with the maneuver she describes pain in the popliteal areas.  Skin reveals no worrisome lesions; there is no jaundice.         Assessment & Plan:  #1 bilateral edema from the knees down. I suspect the edema is related to arthritis (osteoarthritis/degenerative joint disease cortices in the knees. This is probably aggravated popliteal cysts which compromised circulation. At this time the edema is getting better. Clinically there is no evidence of cardiac, pulmonary,or hepatic dysfunction.   Plan: she will be given samples of Celebrex to be taken twice a day for up to 6 days. She knows this is not a long-term option because of the documented GI and cardiac risk associated with routine or regular use.

## 2011-03-20 ENCOUNTER — Ambulatory Visit: Payer: Medicare Other | Admitting: Internal Medicine

## 2011-04-22 ENCOUNTER — Telehealth: Payer: Self-pay | Admitting: *Deleted

## 2011-04-22 MED ORDER — CELECOXIB 200 MG PO CAPS: 200.0000 mg | ORAL_CAPSULE | Freq: Once | ORAL | Status: DC | PRN

## 2011-04-22 NOTE — Telephone Encounter (Signed)
Spoke w/ pt says she was given rx for knee pain of celebrex and only takes it as needed and would like rx sent to pharmacy.

## 2011-04-24 ENCOUNTER — Other Ambulatory Visit: Payer: Self-pay | Admitting: Internal Medicine

## 2011-04-26 NOTE — Op Note (Signed)
NAME:  Tasha Moss, Tasha Moss NO.:  [id number]   MEDICAL RECORD NO.:  [id number]          PATIENT TYPE:  INP   LOCATION:  2899                         FACILITY:  [facility]   PHYSICIAN:  Danae Orleans. Venetia Maxon, M.D.  DATE OF BIRTH:  04-03-1936   DATE OF PROCEDURE:  04/24/2007  DATE OF DISCHARGE:                               OPERATIVE REPORT   PREOPERATIVE DIAGNOSIS:  Herniated cervical disk with spondylosis,  degenerative disk disease and radiculopathy, C4-5.   POSTOPERATIVE DIAGNOSIS:  Herniated cervical disk with spondylosis,  degenerative disk disease and radiculopathy, C4-5.   PROCEDURE:  Anterior cervical decompression and fusion, C4-5, with PEEK  interbody cage, morcellized bone autograft, demineralized bone matrix,  and anterior cervical plate.   SURGEON:  Danae Orleans. Venetia Maxon, M.D.   ASSISTANT:  Clydene Fake, M.D.   ANESTHESIA:  General endotracheal anesthesia.   ESTIMATED BLOOD LOSS:  Minimal.   COMPLICATIONS:  None.   DISPOSITION:  To recovery.   INDICATIONS:  Tasha Moss is a 75 year old woman with left deltoid  weakness and a disk herniation at C4-5 on the left.  It was elected to  take her surgery for anterior cervical decompression and fusion at the  C4-5 level.   PROCEDURE:  Ms. Duggin was brought to the operating room.  Following the  satisfactory and uncomplicated induction of general endotracheal  anesthesia and placement of intravenous lines, the patient was placed in  a supine position on the operating table.  Her neck was placed in slight  extension and she was placed in 10 pounds of halter traction.  Her  anterior neck was then prepped and draped in the usual sterile fashion.  Her previous thyroidectomy incision was extended laterally on the left  from midline to the anterior border of the sternocleidomastoid muscle  after infiltrating skin and subcutaneous tissues with 0.25% Marcaine and  0.5% lidocaine with 1:200,000 epinephrine.  This was  carried sharply  through the platysmal layer and the anterior border of the  sternocleidomastoid muscle was exposed.  Subsequently blunt dissection  was performed, keeping the carotid sheath lateral and the trachea and  esophagus medial, exposing the anterior cervical spine.  A bent spinal  needle was placed at what was felt to be the C4-5 level, and this was  confirmed on intraoperative x-ray.  Subsequently the longus colli  muscles were taken down from the anterior cervical spine from C4 through  C5 bilaterally using electrocautery and Key elevator and a self-  retaining line shadow line retractor was placed to facilitate exposure.  The interspace was incised with a 15 blade and disk material was removed  in a piecemeal fashion.  The disk was highly degenerated and there was a  significant spondylitic degeneration at this disk level.  Ventral  osteophytes were removed from C4 and saved for later use in bone  grafting.  Using a variety Carlen curettes, the disk space was further  evacuated of disk material and under loupe magnification, the endplates  were decorticated with the high-speed drill and bone drillings were  saved for later use in bone graft material.  Microscope was brought into  field and using microdissection technique, the posterior longitudinal  ligament was incised and removed in a piecemeal fashion and there was a  significant amount of foraminal disk herniation on the left, and this  was removed resulting in decompression of the spinal cord dura and the  C5 nerve root on the left.  Similar decompression was performed on the  right.  Hemostasis was assured with Gelfoam-soaked thrombin and after  trial sizing, a 5-mm medium PEEK interbody cage was selected, packed  with bone removed at the time of drilling endplates as well as  demineralized bone matrix.  This was inserted in the interspace and  countersunk appropriately.  The traction weight was removed.  A 12 mm   Trestle anterior cervical plate was then affixed to the anterior  cervical spine with variable-angle 12 mm screws, two at C4, two at C5.  All screws had excellent purchase.  Locking mechanisms were engaged.  Final x-ray demonstrated well-positioned interbody graft and anterior  cervical plate.  Hemostasis was assured in the soft tissues and the  wound was irrigated.  Subsequently the platysma layer was closed with 3-  0 Vicryl sutures and the skin edges were approximated with 3-0 Vicryl  interrupted inverted sutures.  The wound was dressed with Dermabond.  The patient was extubated in the operating room and taken to the  recovery room in stable and satisfactory condition, having tolerated her  operation well.  Counts correct at the end of the case.      Danae Orleans. Venetia Maxon, M.D.  Electronically Signed     JDS/MEDQ  D:  04/24/2007  T:  04/24/2007  Job:  [id number]

## 2011-04-26 NOTE — Op Note (Signed)
NAME:  Tasha Moss, Tasha Moss NO.:  [id number]   MEDICAL RECORD NO.:  [id number]          PATIENT TYPE:  INP   LOCATION:  5735                         FACILITY:  [facility]   PHYSICIAN:  Velora Heckler, MD      DATE OF BIRTH:  08/08/1936   DATE OF PROCEDURE:  08/18/2006  DATE OF DISCHARGE:                                 OPERATIVE REPORT   PREOPERATIVE DIAGNOSIS:  Right thyroid nodule with cytologic atypia.   POSTOPERATIVE DIAGNOSIS:  Probable thyroiditis.   PROCEDURE PERFORMED:  Right thyroid lobectomy.   SURGEON:  Velora Heckler, M.D., FACS   ASSISTANT:  Ovidio Kin, M.D., FACS   ANESTHESIA:  General.   ESTIMATED BLOOD LOSS:  Minimal.   PREPARATION:  Betadine.   COMPLICATIONS:  None.   INDICATIONS:  The patient is a 75 year old white female from [place],  [place], referred by Dr. Marga Melnick with thyroid nodule.  The  patient had been diagnosed with hypothyroidism in September of 2006.  She  was placed on thyroid hormone replacement.  A thyroid ultrasound June 2007  showed a 1.2-cm right lower pole nodule.  Fine-needle aspiration was  performed and showed cytologic atypia possibly representing thyroiditis.  The patient now comes to surgery for excision for definitive diagnosis.   PROCEDURES PERFORMED:  The procedure was performed at the [facility]. [facility].  The patient is brought to the operating room and placed  in the supine position on the operating room table.  Following  administration of general anesthesia the patient is positioned and then  prepped and draped in the usual strict aseptic fashion.  After ascertaining  that an adequate level of anesthesia had been obtained, a Kocher incision  was made with a #15 blade.  Dissection was carried down through the  subcutaneous tissues and platysma.  Hemostasis is obtained via  electrocautery.  Skin flaps were developed cephalad and caudad from the  thyroid notch to the sternal notch.   A Mahorner self-retaining retractor was  placed for exposure.  Strap muscles are incised in the midline.  Dissection  is carried down to the thyroid isthmus.  Strap muscles are reflected  initially to the left so that the left lobe is exposed.  On palpation this  is a small firm lobe without dominant mass or nodules.  There is no  significant lymphadenopathy.   Next, we turned our attention to the right side.  Again, strap muscles are  reflected laterally.  Venous tributaries are divided between small  Ligaclips.  The right lobe is exposed.  It is moderately larger than the  left.  Superior pole vessels are dissected out, ligated in continuity with 2-  0 silk ties, medium Ligaclips and divided.  Branches of the inferior thyroid  artery are divided between small and medium Ligaclips.  Inferior venous  tributaries are divided between small and medium Ligaclips.  Recurrent nerve  is identified and preserved.  Branches of the inferior thyroid artery are  divided between small Ligaclips.  Ligament of Allyson Sabal is transected with the  electrocautery, and the gland  is rolled up and onto the anterior trachea.  A  small pyramidal lobe is dissected out with the electrocautery and kept with  the specimen.  Isthmus is mobilized across the midline.  The thyroid is  transected between hemostats at the junction of the isthmus and left thyroid  lobe.  The left lobe is suture ligated with 3-0 Vicryl suture ligatures.  The specimen is submitted to pathology where Dr. Guerry Bruin notes  thyroiditis.  No obvious signs of malignancy are identified.  Good  hemostasis is obtained in the right neck.  The neck is irrigated with saline  which was evacuated.  Surgicel was placed over the area of the recurrent  nerve and parathyroid glands.  Strap muscles were reapproximated in the  midline with interrupted 3-0 Vicryl sutures.  The platysma was closed with  interrupted 3-0 Vicryl sutures.  The skin was closed with a  running 4-0  Vicryl subcuticular suture.  The wound is washed and dried and benzoin and  Steri-Strips are applied.  Sterile dressings are applied.  The patient is  awakened from anesthesia and brought to the recovery room in stable  condition.  The patient tolerated the procedure well.      Velora Heckler, MD  Electronically Signed     TMG/MEDQ  D:  08/18/2006  T:  08/19/2006  Job:  [id number]   cc:   Titus Dubin. Alwyn Ren, MD,FACP,FCCP

## 2011-04-26 NOTE — Assessment & Plan Note (Signed)
[facility]                        [place] OFFICE NOTE   TALISA, PETRAK                       MRN:          [id number]  DATE:01/20/2007                            DOB:          04-21-1936    Cintya Daughety was seen January 20, 2007 to discuss her lipid panel.  There has been a significant reduction in risk from 20-25% to 10% based  on pravastatin. Her triglycerides have elevated, however, and it was  recommended that she restrict white carbs and high fructose corn syrup.  The lipids will be rechecked in August 2008.   At this time, her A1c is 5.1, which is non-diabetic, but elevated  triglycerides could be a precursor of diabetes.  The A1c can be  reevaluated in August.   Additionally, she describes constant pain in the left neck, extending  into the triceps.   She had mentioned some pain in the left shoulder when I saw her on  October 02, 2006.  Since that time, p.r.n. Celebrex was recommended.  Long-term administration was to be  avoided because of the black-box  warnings  for this class of drugs in reference to coronary artery  disease risk and GI risk .   Because of the constancy of her symptoms, an MRI of the cervical spine  was performed February 18 at [facility].  This revealed  degenerative disk disease at C4-5 with asymmetric spurs.  These appeared  to be compressing the left C5 nerve root, consistent with the patient's  symptoms.  Additionally, there was a central disk herniation at C3-4  without any nerve root impingement.  There were degenerative changes at  C7-T1, but again no impingement was noted.   Referral to a neurosurgeon will be recommended.   PAST MEDICAL HISTORY:  Includes disk surgery on the lumbosacral spine in  1997.  She has had renal calculi, tonsillectomy, hip surgery  bilaterally, thyroid biopsy, which revealed Hashimoto's thyroiditis,  prompting a lobectomy.   She is intolerant or allergic  to NEURONTIN and SIMVASTATIN.   She remains on fluoxetine, glucosamine, calcium, and vitamin D, Levoxyl,  aspirin, pravastatin.  Consultation will be scheduled and she will be  notified of that appointment.     Titus Dubin. Alwyn Ren, MD,FACP,FCCP  Electronically Signed    [facility]/MedQ  DD: 01/27/2007  DT: 01/28/2007  Job #: [id number]

## 2011-05-22 ENCOUNTER — Telehealth: Payer: Self-pay | Admitting: Internal Medicine

## 2011-05-22 MED ORDER — CLONAZEPAM 0.5 MG PO TABS: ORAL_TABLET | ORAL | Status: DC

## 2011-05-22 NOTE — Telephone Encounter (Signed)
RX called into pharmacy

## 2011-05-22 NOTE — Telephone Encounter (Signed)
Patient wants refill for clonazepam - [facility] [place] - she said pharmacy wont fax refill request

## 2011-06-13 ENCOUNTER — Other Ambulatory Visit: Payer: Self-pay | Admitting: Internal Medicine

## 2011-06-17 MED ORDER — CELECOXIB 200 MG PO CAPS: 200.0000 mg | ORAL_CAPSULE | Freq: Every day | ORAL | Status: DC | PRN

## 2011-06-17 NOTE — Telephone Encounter (Signed)
This prescriptions looks like it was sent on Thursday 7/5---patient says she checked with pharmacy this morning and they have no record of this Celebrex prescription ---verified with patient that this is correct [facility] in [place]

## 2011-06-17 NOTE — Telephone Encounter (Signed)
RE-SENT RX

## 2011-06-24 ENCOUNTER — Other Ambulatory Visit: Payer: Self-pay | Admitting: Internal Medicine

## 2011-07-16 ENCOUNTER — Other Ambulatory Visit: Payer: Self-pay | Admitting: Internal Medicine

## 2011-08-20 ENCOUNTER — Other Ambulatory Visit: Payer: Self-pay | Admitting: Internal Medicine

## 2011-09-11 ENCOUNTER — Other Ambulatory Visit: Payer: Self-pay | Admitting: Internal Medicine

## 2011-09-27 ENCOUNTER — Other Ambulatory Visit: Payer: Self-pay | Admitting: Internal Medicine

## 2011-11-18 ENCOUNTER — Other Ambulatory Visit: Payer: Self-pay | Admitting: Internal Medicine

## 2011-11-21 ENCOUNTER — Other Ambulatory Visit: Payer: Self-pay | Admitting: Internal Medicine

## 2011-12-11 ENCOUNTER — Other Ambulatory Visit: Payer: Self-pay | Admitting: Internal Medicine

## 2011-12-11 NOTE — Telephone Encounter (Signed)
TSH 244.9 

## 2011-12-26 ENCOUNTER — Other Ambulatory Visit: Payer: Self-pay | Admitting: Internal Medicine

## 2012-01-13 ENCOUNTER — Other Ambulatory Visit: Payer: Self-pay | Admitting: Internal Medicine

## 2012-01-13 NOTE — Telephone Encounter (Signed)
Dr.Hopper please advise, when filling this medication a notification pops up stating: CAUTION in Geriatric patient's, please advise on refill request and if it is ok to override this notification in the future   Last filled 11/18/2011, last office visit 03/2011

## 2012-01-14 NOTE — Telephone Encounter (Signed)
Agents in this class should be taken as infrequently as possible. Regular use of these agents significantly increase risk of gastric bleeding and acute cardiac events or stroke. Refill OK but DONOT TAKE ROUTINELY due to risks

## 2012-01-29 ENCOUNTER — Other Ambulatory Visit: Payer: Self-pay | Admitting: Internal Medicine

## 2012-01-29 NOTE — Telephone Encounter (Signed)
Patient needs to schedule a CPX and fasting labs  

## 2012-02-17 ENCOUNTER — Other Ambulatory Visit: Payer: Self-pay | Admitting: Internal Medicine

## 2012-02-17 NOTE — Telephone Encounter (Signed)
Prescription sent to pharmacy.  NR.  Patient needs appointment.

## 2012-02-24 ENCOUNTER — Other Ambulatory Visit: Payer: Self-pay | Admitting: Internal Medicine

## 2012-02-24 NOTE — Telephone Encounter (Signed)
Patient needs to schedule a CPX  

## 2012-03-04 ENCOUNTER — Other Ambulatory Visit: Payer: Self-pay | Admitting: Internal Medicine

## 2012-03-20 ENCOUNTER — Other Ambulatory Visit: Payer: Self-pay | Admitting: Internal Medicine

## 2012-04-17 ENCOUNTER — Encounter: Payer: Self-pay | Admitting: Internal Medicine

## 2012-04-17 ENCOUNTER — Ambulatory Visit (INDEPENDENT_AMBULATORY_CARE_PROVIDER_SITE_OTHER): Payer: Medicare Other | Admitting: Internal Medicine

## 2012-04-17 VITALS — BP 140/78 | HR 77 | Temp 98.2°F | Wt 184.0 lb

## 2012-04-17 DIAGNOSIS — E782 Mixed hyperlipidemia: Secondary | ICD-10-CM

## 2012-04-17 DIAGNOSIS — D510 Vitamin B12 deficiency anemia due to intrinsic factor deficiency: Secondary | ICD-10-CM

## 2012-04-17 DIAGNOSIS — I10 Essential (primary) hypertension: Secondary | ICD-10-CM

## 2012-04-17 DIAGNOSIS — K222 Esophageal obstruction: Secondary | ICD-10-CM

## 2012-04-17 DIAGNOSIS — T887XXA Unspecified adverse effect of drug or medicament, initial encounter: Secondary | ICD-10-CM

## 2012-04-17 DIAGNOSIS — Z0000 Encounter for general adult medical examination without abnormal findings: Secondary | ICD-10-CM

## 2012-04-17 DIAGNOSIS — E039 Hypothyroidism, unspecified: Secondary | ICD-10-CM

## 2012-04-17 MED ORDER — CELECOXIB 200 MG PO CAPS: 200.0000 mg | ORAL_CAPSULE | Freq: Every day | ORAL | Status: DC

## 2012-04-17 NOTE — Progress Notes (Signed)
Subjective:    Patient ID: Tasha Moss, female    DOB: [birth year], 76 y.o.   MRN: [id number]  HPI Medicare Wellness Visit:  The following psychosocial & medical history were reviewed as required by Medicare.   Social history: caffeine: no , alcohol: none ,  tobacco use : never  & exercise : unable due to DJD.   Home & personal  safety / fall risk: see below, activities of daily living: no help needed , seatbelt use : yes , and smoke alarm employment : yes .  Power of Attorney/Living Will status : in place  Vision ( as recorded per Nurse) & Hearing  evaluation : last Ophth exam 2/13.Hearing aids checked on regular basis. Orientation :oriented X 3 , memory & recall :good,  math testing: good,and mood & affect : normal . Depression / anxiety: denied Travel history : never , immunization status :? Tetanus status , transfusion history:  no, and preventive health surveillance ( colonoscopies, BMD , etc as per protocol/ [facility]): colonoscopy up date, Dental care:  Every 12 mos . Chart reviewed &  Updated. Active issues reviewed & addressed.       Review of Systems HYPERTENSION: Disease Monitoring: Blood pressure range-not monitored  Chest pain, palpitations- no       Dyspnea- no Medications: Compliance- yes Lightheadedness,Syncope- occasional positional symptoms & also BPV    Edema- no  HYPERLIPIDEMIA: Disease Monitoring: See symptoms for Hypertension Medications: Compliance- yes  Abd pain, bowel changes- no   Muscle aches- some cramps occasionally Polyuria/phagia/dipsia-not on Vesicare taken every other day     Visual problems- no      Objective:   Physical Exam Gen.:  well-nourished in appearance. Alert, appropriate and cooperative throughout exam. Head: Normocephalic without obvious abnormalities Eyes: No corneal or conjunctival inflammation noted. Ptosis OD. Extraocular motion intact. Vision grossly normal with lenses. Ears: External  ear exam reveals no significant lesions or  deformities. TMs normal. Hearing aids  bilaterally. Nose: External nasal exam reveals no deformity or inflammation. Nasal mucosa are pink and moist. No lesions or exudates noted.  Mouth: Oral mucosa and oropharynx reveal no lesions or exudates. Teeth in good repair.Uvula split Neck: No deformities, masses, or tenderness noted. Range of motion & Thyroid normal Lungs: Normal respiratory effort; chest expands symmetrically. Lungs are clear to auscultation without rales, wheezes, or increased work of breathing. Heart: Normal rate and rhythm. Normal S1 and S2. No gallop, click, or rub. S4 with slurring at  LSB . Abdomen: Bowel sounds normal; abdomen soft and nontender. No masses, organomegaly or hernias noted. Genitalia: Dr Josephina Shih, Gyn                         Musculoskeletal/extremities: Lordosis noted of  the thoracic  spine. No clubbing, cyanosis, edema noted. Range of motion  decreased hips. Negative straight leg raising almost 90 .Tone & strength  normal.Mixed arthritic changes in hands with some flexion contractures. Nail health  good. Vascular: Carotid, radial artery, dorsalis pedis and  posterior tibial pulses are full and equal. No bruits present. Neurologic: Alert and oriented x3. Deep tendon reflexes symmetrical and normal.         Skin: Intact without suspicious lesions or rashes. Lymph: No cervical, axillary lymphadenopathy present. Psych: Mood and affect are normal. Normally interactive  Assessment & Plan:  #1 Medicare Wellness Exam; criteria met ; data entered #2 Problem List reviewed ; Assessment/ Recommendations made Plan: see Orders

## 2012-04-17 NOTE — Patient Instructions (Signed)
Preventive Health Care: Eat a low-fat diet with lots of fruits and vegetables, up to 7-9 servings per day. Consume less than 30 grams of sugar per day from foods & drinks with High Fructose Corn Syrup as # 1,2,3 or #4 on label.  Please  schedule fasting Labs : BMET,Lipids, hepatic panel, CBC & dif, TSH, B12 level. PLEASE BRING THESE INSTRUCTIONS TO FOLLOW UP  LAB APPOINTMENT.This will guarantee correct labs are drawn, eliminating need for repeat blood sampling ( needle sticks ! ). Diagnoses /Codes: 530.81, 401.9,244.9,272.4,281.0.   Please try to go on My Chart within  24 hours of labs  to allow me to release the results directly to you.

## 2012-04-21 ENCOUNTER — Other Ambulatory Visit (INDEPENDENT_AMBULATORY_CARE_PROVIDER_SITE_OTHER): Payer: Medicare Other

## 2012-04-21 DIAGNOSIS — E039 Hypothyroidism, unspecified: Secondary | ICD-10-CM

## 2012-04-21 DIAGNOSIS — E785 Hyperlipidemia, unspecified: Secondary | ICD-10-CM

## 2012-04-21 DIAGNOSIS — Z79899 Other long term (current) drug therapy: Secondary | ICD-10-CM

## 2012-04-21 DIAGNOSIS — I10 Essential (primary) hypertension: Secondary | ICD-10-CM

## 2012-04-21 DIAGNOSIS — K219 Gastro-esophageal reflux disease without esophagitis: Secondary | ICD-10-CM

## 2012-04-21 DIAGNOSIS — D510 Vitamin B12 deficiency anemia due to intrinsic factor deficiency: Secondary | ICD-10-CM

## 2012-04-21 LAB — LIPID PANEL
Cholesterol: 158 mg/dL (ref 0–200)
HDL: 43.1 mg/dL (ref >39.00)
LDL Cholesterol: 87 mg/dL (ref 0–99)
Total CHOL/HDL Ratio: 4
Triglycerides: 141 mg/dL (ref 0.0–149.0)
VLDL: 28.2 mg/dL (ref 0.0–40.0)

## 2012-04-21 LAB — BASIC METABOLIC PANEL
BUN: 16 mg/dL (ref 6–23)
CO2: 26 mEq/L (ref 19–32)
Calcium: 9.1 mg/dL (ref 8.4–10.5)
Chloride: 107 mEq/L (ref 96–112)
Creatinine, Ser: 0.6 mg/dL (ref 0.4–1.2)
GFR: 101.83 mL/min (ref >60.00)
Glucose, Bld: 99 mg/dL (ref 70–99)
Potassium: 3.6 mEq/L (ref 3.5–5.1)
Sodium: 142 mEq/L (ref 135–145)

## 2012-04-21 LAB — CBC WITH DIFFERENTIAL/PLATELET
Basophils Absolute: 0 K/uL (ref 0.0–0.1)
Basophils Relative: 0.3 % (ref 0.0–3.0)
Eosinophils Absolute: 0.2 K/uL (ref 0.0–0.7)
Eosinophils Relative: 3.9 % (ref 0.0–5.0)
HCT: 37.3 % (ref 36.0–46.0)
Hemoglobin: 12.6 g/dL (ref 12.0–15.0)
Lymphocytes Relative: 22.8 % (ref 12.0–46.0)
Lymphs Abs: 1.2 K/uL (ref 0.7–4.0)
MCHC: 33.8 g/dL (ref 30.0–36.0)
MCV: 92.6 fl (ref 78.0–100.0)
Monocytes Absolute: 0.4 K/uL (ref 0.1–1.0)
Monocytes Relative: 7.3 % (ref 3.0–12.0)
Neutro Abs: 3.6 K/uL (ref 1.4–7.7)
Neutrophils Relative %: 65.7 % (ref 43.0–77.0)
Platelets: 185 K/uL (ref 150.0–400.0)
RBC: 4.03 Mil/uL (ref 3.87–5.11)
RDW: 15.2 % — ABNORMAL HIGH (ref 11.5–14.6)
WBC: 5.4 K/uL (ref 4.5–10.5)

## 2012-04-21 LAB — HEPATIC FUNCTION PANEL
ALT: 16 U/L (ref 0–35)
AST: 18 U/L (ref 0–37)
Albumin: 3.6 g/dL (ref 3.5–5.2)
Alkaline Phosphatase: 66 U/L (ref 39–117)
Bilirubin, Direct: 0 mg/dL (ref 0.0–0.3)
Total Bilirubin: 0.6 mg/dL (ref 0.3–1.2)
Total Protein: 7 g/dL (ref 6.0–8.3)

## 2012-04-21 LAB — VITAMIN B12: Vitamin B-12: 782 pg/mL (ref 211–911)

## 2012-04-21 LAB — TSH: TSH: 1.45 u[IU]/mL (ref 0.35–5.50)

## 2012-04-21 NOTE — Progress Notes (Signed)
Labs only

## 2012-04-22 ENCOUNTER — Other Ambulatory Visit: Payer: Self-pay | Admitting: Internal Medicine

## 2012-06-01 ENCOUNTER — Telehealth: Payer: Self-pay | Admitting: Internal Medicine

## 2012-06-01 NOTE — Telephone Encounter (Signed)
Refill: Levothyroxine tab. Take one tablet by mouth every day in the morning. Qty 90. Last fill 03-04-12

## 2012-06-02 MED ORDER — LEVOTHYROXINE SODIUM 125 MCG PO TABS: ORAL_TABLET | ORAL | Status: DC

## 2012-06-02 NOTE — Telephone Encounter (Signed)
RX sent

## 2012-06-22 ENCOUNTER — Other Ambulatory Visit: Payer: Self-pay | Admitting: Internal Medicine

## 2012-06-29 ENCOUNTER — Other Ambulatory Visit: Payer: Self-pay | Admitting: Internal Medicine

## 2012-08-24 ENCOUNTER — Other Ambulatory Visit: Payer: Self-pay | Admitting: Internal Medicine

## 2012-08-26 NOTE — Telephone Encounter (Signed)
LM on triage line at 421pm 9.17.13 Pt states she was advised to take this medication daily and does not know why this has not already been done. Her pharmacy advised they have not heard from [place] and she would like this filled ASAP as she is going out of town and must take th is medication daily Pt wants a call back at [phone]

## 2012-08-27 ENCOUNTER — Other Ambulatory Visit: Payer: Self-pay | Admitting: Internal Medicine

## 2012-08-27 NOTE — Telephone Encounter (Signed)
I called [facility] in [place] to have rx cancelled (spoke with steve). I called pharmacy @ [facility] x 2 line busy, I will try again tomorrow to call in rx

## 2012-08-27 NOTE — Telephone Encounter (Signed)
LM ON TRIAGE LINE 423PM NEEDS refill for clonazepam for this patient, pt is on vacation at [facility] and left rx at home, does have refills in [place] but he is unable to transfer between states, wants a new rx called in & he will cancel refill left in [place] Cb# [phone]

## 2012-08-28 MED ORDER — CLONAZEPAM 0.5 MG PO TABS: ORAL_TABLET | ORAL | Status: DC

## 2012-08-28 NOTE — Telephone Encounter (Signed)
RX called in, patient made aware

## 2012-10-26 ENCOUNTER — Other Ambulatory Visit: Payer: Self-pay | Admitting: Internal Medicine

## 2012-10-26 MED ORDER — CLONAZEPAM 0.5 MG PO TABS: ORAL_TABLET | ORAL | Status: DC

## 2012-10-26 NOTE — Telephone Encounter (Signed)
Pt needs clonazepam called in to local pharm (one that was used in the past)- they state they received this meds while out of state on vacation but now they need it locally.

## 2012-10-26 NOTE — Telephone Encounter (Signed)
RX called in .

## 2012-11-28 ENCOUNTER — Other Ambulatory Visit: Payer: Self-pay | Admitting: Internal Medicine

## 2012-12-23 ENCOUNTER — Other Ambulatory Visit: Payer: Self-pay | Admitting: Internal Medicine

## 2012-12-25 NOTE — Telephone Encounter (Signed)
Left message on VM for patient to return call when available. I indicated this is a NON-Urgent message. Reason for call: patient will need to stop by the office to sign controlled substance contract, rx will be given at the same time (Newly developed policy). This is a one time signing that patient must do. In the future rx's response to be given directly to pharmacy

## 2012-12-28 ENCOUNTER — Encounter: Payer: Self-pay | Admitting: *Deleted

## 2012-12-28 NOTE — Telephone Encounter (Signed)
Pt aware will come in to sign agreement and pick up Rx.

## 2013-02-03 ENCOUNTER — Encounter: Payer: Self-pay | Admitting: Internal Medicine

## 2013-02-04 ENCOUNTER — Other Ambulatory Visit: Payer: Self-pay | Admitting: Internal Medicine

## 2013-02-23 ENCOUNTER — Other Ambulatory Visit: Payer: Self-pay | Admitting: Internal Medicine

## 2013-03-08 ENCOUNTER — Other Ambulatory Visit: Payer: Self-pay | Admitting: Internal Medicine

## 2013-03-18 ENCOUNTER — Other Ambulatory Visit: Payer: Self-pay | Admitting: Internal Medicine

## 2013-04-06 ENCOUNTER — Other Ambulatory Visit: Payer: Self-pay | Admitting: Internal Medicine

## 2013-04-22 ENCOUNTER — Other Ambulatory Visit: Payer: Self-pay | Admitting: Internal Medicine

## 2013-04-22 NOTE — Telephone Encounter (Signed)
Med filled. Pt needs OV.  

## 2013-04-23 ENCOUNTER — Other Ambulatory Visit: Payer: Self-pay | Admitting: Internal Medicine

## 2013-04-23 NOTE — Telephone Encounter (Signed)
Pending CPX scheduled for 06/21/13

## 2013-04-26 ENCOUNTER — Other Ambulatory Visit: Payer: Self-pay | Admitting: General Practice

## 2013-04-26 MED ORDER — PRAVASTATIN SODIUM 40 MG PO TABS: ORAL_TABLET | ORAL | Status: DC

## 2013-04-26 NOTE — Telephone Encounter (Signed)
Pt has a CPE scheduled for July. Med filed until then.

## 2013-05-03 ENCOUNTER — Other Ambulatory Visit: Payer: Self-pay | Admitting: Internal Medicine

## 2013-05-04 NOTE — Telephone Encounter (Signed)
Pending appointment July 2014

## 2013-05-05 ENCOUNTER — Other Ambulatory Visit: Payer: Self-pay | Admitting: Internal Medicine

## 2013-06-08 ENCOUNTER — Other Ambulatory Visit: Payer: Self-pay | Admitting: Internal Medicine

## 2013-06-10 ENCOUNTER — Other Ambulatory Visit: Payer: Self-pay | Admitting: Internal Medicine

## 2013-06-21 ENCOUNTER — Ambulatory Visit (INDEPENDENT_AMBULATORY_CARE_PROVIDER_SITE_OTHER): Payer: Medicare Other | Admitting: Internal Medicine

## 2013-06-21 ENCOUNTER — Encounter: Payer: Self-pay | Admitting: Internal Medicine

## 2013-06-21 VITALS — BP 138/76 | HR 72 | Temp 97.4°F | Resp 12 | Ht 62.0 in | Wt 184.0 lb

## 2013-06-21 DIAGNOSIS — I10 Essential (primary) hypertension: Secondary | ICD-10-CM

## 2013-06-21 DIAGNOSIS — Z0000 Encounter for general adult medical examination without abnormal findings: Secondary | ICD-10-CM

## 2013-06-21 DIAGNOSIS — Z8639 Personal history of other endocrine, nutritional and metabolic disease: Secondary | ICD-10-CM | POA: Insufficient documentation

## 2013-06-21 DIAGNOSIS — D510 Vitamin B12 deficiency anemia due to intrinsic factor deficiency: Secondary | ICD-10-CM

## 2013-06-21 DIAGNOSIS — E785 Hyperlipidemia, unspecified: Secondary | ICD-10-CM

## 2013-06-21 DIAGNOSIS — E039 Hypothyroidism, unspecified: Secondary | ICD-10-CM

## 2013-06-21 DIAGNOSIS — G2581 Restless legs syndrome: Secondary | ICD-10-CM

## 2013-06-21 LAB — HEPATIC FUNCTION PANEL
ALT: 21 U/L (ref 0–35)
AST: 23 U/L (ref 0–37)
Albumin: 3.8 g/dL (ref 3.5–5.2)
Alkaline Phosphatase: 72 U/L (ref 39–117)
Bilirubin, Direct: 0 mg/dL (ref 0.0–0.3)
Total Bilirubin: 0.7 mg/dL (ref 0.3–1.2)
Total Protein: 7 g/dL (ref 6.0–8.3)

## 2013-06-21 LAB — CBC WITH DIFFERENTIAL/PLATELET
Basophils Absolute: 0 10*3/uL (ref 0.0–0.1)
Basophils Relative: 0.3 % (ref 0.0–3.0)
Eosinophils Absolute: 0.2 10*3/uL (ref 0.0–0.7)
Eosinophils Relative: 3.4 % (ref 0.0–5.0)
HCT: 40.1 % (ref 36.0–46.0)
Hemoglobin: 13.5 g/dL (ref 12.0–15.0)
Lymphocytes Relative: 20.5 % (ref 12.0–46.0)
Lymphs Abs: 1.3 10*3/uL (ref 0.7–4.0)
MCHC: 33.6 g/dL (ref 30.0–36.0)
MCV: 92.7 fl (ref 78.0–100.0)
Monocytes Absolute: 0.5 10*3/uL (ref 0.1–1.0)
Monocytes Relative: 7.6 % (ref 3.0–12.0)
Neutro Abs: 4.2 10*3/uL (ref 1.4–7.7)
Neutrophils Relative %: 68.2 % (ref 43.0–77.0)
Platelets: 195 10*3/uL (ref 150.0–400.0)
RBC: 4.32 Mil/uL (ref 3.87–5.11)
RDW: 14 % (ref 11.5–14.6)
WBC: 6.1 10*3/uL (ref 4.5–10.5)

## 2013-06-21 LAB — BASIC METABOLIC PANEL
BUN: 17 mg/dL (ref 6–23)
CO2: 28 mEq/L (ref 19–32)
Calcium: 9.3 mg/dL (ref 8.4–10.5)
Chloride: 103 mEq/L (ref 96–112)
Creatinine, Ser: 0.7 mg/dL (ref 0.4–1.2)
GFR: 94.33 mL/min (ref >60.00)
Glucose, Bld: 96 mg/dL (ref 70–99)
Potassium: 3.8 mEq/L (ref 3.5–5.1)
Sodium: 138 mEq/L (ref 135–145)

## 2013-06-21 LAB — IBC PANEL
Iron: 77 ug/dL (ref 42–145)
Saturation Ratios: 25 % (ref 20.0–50.0)
Transferrin: 220.2 mg/dL (ref 212.0–360.0)

## 2013-06-21 LAB — TSH: TSH: 0.75 u[IU]/mL (ref 0.35–5.50)

## 2013-06-21 LAB — LIPID PANEL
Cholesterol: 170 mg/dL (ref 0–200)
HDL: 40.9 mg/dL (ref >39.00)
Total CHOL/HDL Ratio: 4
Triglycerides: 203 mg/dL — ABNORMAL HIGH (ref 0.0–149.0)
VLDL: 40.6 mg/dL — ABNORMAL HIGH (ref 0.0–40.0)

## 2013-06-21 LAB — VITAMIN B12: Vitamin B-12: 976 pg/mL — ABNORMAL HIGH (ref 211–911)

## 2013-06-21 LAB — LDL CHOLESTEROL, DIRECT: Direct LDL: 103.8 mg/dL

## 2013-06-21 MED ORDER — LEVOTHYROXINE SODIUM 125 MCG PO TABS: ORAL_TABLET | ORAL | Status: DC

## 2013-06-21 MED ORDER — OMEPRAZOLE 20 MG PO CPDR: DELAYED_RELEASE_CAPSULE | ORAL | Status: DC

## 2013-06-21 MED ORDER — PRAVASTATIN SODIUM 40 MG PO TABS: ORAL_TABLET | ORAL | Status: DC

## 2013-06-21 MED ORDER — FLUOXETINE HCL 10 MG PO CAPS: ORAL_CAPSULE | ORAL | Status: DC

## 2013-06-21 MED ORDER — ROPINIROLE HCL 0.25 MG PO TABS: ORAL_TABLET | ORAL | Status: DC

## 2013-06-21 NOTE — Progress Notes (Signed)
Subjective:    Patient ID: Tasha Moss, female    DOB: 01/24/1936, 77 y.o.   MRN: [id number]  HPI Medicare Wellness Visit:  Psychosocial & medical history were reviewed as required by Medicare (abuse,antisocial behavioral risks,firearm risk).  Social history: caffeine: no  , alcohol:no   ,  tobacco use:  never Exercise :  See below Home & personal  safety / fall risk: see below ; must use cane outside her home Limitations of activities of daily living: arthritic issues limit walking Seatbelt  and smoke alarm use:yes Power of Attorney/Living Will status : in place Ophthalmology exam status : current; sees Dr Nelle Don @ least annually Hearing evaluation status: current; aids from [employer] Orientation :oriented X 3 Memory & recall :good Math testing:fair Active depression / anxiety:denied Foreign travel history : never Immunization status for Shingles /Flu/ PNA/ tetanus :current Transfusion history:  no Preventive health surveillance status of colonoscopy, BMD , mammograms,PAP as per protocol/ SOC: current Dental care:  Every 12 mos Chart reviewed &  Updated. Active issues reviewed & addressed.     Review of Systems She is on a modified heart healthy diet; she is unable to exercise due to arthritis . Specifically she denies chest pain, palpitations, dyspnea, or claudication.  Walking causes knee pain ; legs are weak necessitating a cane. Family history is positive for premature stroke in her mother @ 51.  There is medication compliance with the statin. Significant abdominal symptoms & memory deficit   Denied.No myalgias but RLS poorly controlled .     Objective:   Physical Exam Gen.:  well-nourished in appearance. Alert, appropriate and cooperative throughout exam. Head: Normocephalic without obvious abnormalities Eyes: No corneal or conjunctival inflammation noted. Extraocular motion intact. Vision grossly decreased OD even with lenses Ears: Hearing aids bilaterally. Nose:  External nasal exam reveals no deformity or inflammation. Nasal mucosa are pink and moist. No lesions or exudates noted. Septum  dislocated to L Mouth: Oral mucosa and oropharynx reveal no lesions or exudates. Teeth in good repair. Central hole in uvula Neck: No deformities, masses, or tenderness noted. Range of motion decreased. Thyroid absent on R. Lungs: Normal respiratory effort; chest expands symmetrically. Lungs are clear to auscultation without rales, wheezes, or increased work of breathing. Heart: Normal rate and rhythm. Normal S1 and S2. No gallop, click, or rub.S4 w/o murmur. Abdomen: Bowel sounds normal; abdomen soft and nontender. No masses, organomegaly or hernias noted. Genitalia: As per Gyn                                  Musculoskeletal/extremities:Accentuated curvature of upper thoracic  Spine. No clubbing, cyanosis, edema, or significant extremity  deformity noted. Range of motion decreased @ knees & hips .Tone & strength  Normal. Joints  reveal mild  DJD DIP changes. Nail health good. Able to lie down & sit up w/o help. Negative SLR bilaterally Vascular: Carotid, radial artery, dorsalis pedis and  posterior tibial pulses are full and equal. No bruits present. Neurologic: Alert and oriented x3. Deep tendon reflexes symmetrical but 0-1/2+ @ knees        Skin: Intact without suspicious lesions or rashes. Lymph: No cervical, axillary lymphadenopathy present. Psych: Mood and affect are normal. Normally interactive  Assessment & Plan:  #1 Medicare Wellness Exam; criteria met ; data entered #2 Problem List/Diagnoses reviewed Plan:  Assessments made/ Orders entered  

## 2013-06-21 NOTE — Patient Instructions (Addendum)
If you activate the  My Chart system; lab & Xray results will be released directly  to you as soon as I review & address these through the computer. If you choose not to sign up for My Chart within 36 hours of labs being drawn; results will be reviewed & interpretation added before being copied & mailed, causing a delay in getting the results to you.If you do not receive that report within 7-10 days ,please call. Additionally you can use this system to gain direct  access to your records  if  out of town or @ an office of a  physician who is not in  the My Chart network.  This improves continuity of care & places you in control of your medical record.  Trial of generic Requip if Clonazepam TWO @ bedtime ineffective. Discuss possibility of using Tramadol in view of Morphine "allergy"with your Pharmacist.

## 2013-06-23 ENCOUNTER — Other Ambulatory Visit: Payer: Self-pay | Admitting: Internal Medicine

## 2013-07-13 ENCOUNTER — Other Ambulatory Visit: Payer: Self-pay | Admitting: Internal Medicine

## 2013-07-16 ENCOUNTER — Encounter: Payer: Self-pay | Admitting: Internal Medicine

## 2013-07-21 ENCOUNTER — Other Ambulatory Visit: Payer: Self-pay | Admitting: Internal Medicine

## 2013-08-03 ENCOUNTER — Other Ambulatory Visit: Payer: Self-pay | Admitting: Internal Medicine

## 2013-08-04 NOTE — Telephone Encounter (Signed)
Last seen and UDS complete on 06/21/13 low risk. Rx filled 06/23/13 #60. Please advise      KP

## 2013-08-11 ENCOUNTER — Other Ambulatory Visit: Payer: Self-pay | Admitting: Internal Medicine

## 2013-08-11 NOTE — Telephone Encounter (Signed)
Med filled.  

## 2013-09-16 ENCOUNTER — Ambulatory Visit (INDEPENDENT_AMBULATORY_CARE_PROVIDER_SITE_OTHER): Payer: Medicare Other

## 2013-09-16 DIAGNOSIS — Z23 Encounter for immunization: Secondary | ICD-10-CM

## 2013-10-07 ENCOUNTER — Other Ambulatory Visit: Payer: Self-pay | Admitting: Orthopedic Surgery

## 2013-10-07 DIAGNOSIS — M545 Low back pain, unspecified: Secondary | ICD-10-CM

## 2013-10-18 ENCOUNTER — Ambulatory Visit
Admission: RE | Admit: 2013-10-18 | Discharge: 2013-10-18 | Disposition: A | Discharge status: Home / Self Care | Payer: Medicare Other | Source: Ambulatory Visit | Attending: Orthopedic Surgery | Admitting: Orthopedic Surgery

## 2013-10-18 DIAGNOSIS — M545 Low back pain, unspecified: Secondary | ICD-10-CM

## 2014-06-13 ENCOUNTER — Other Ambulatory Visit: Payer: Self-pay

## 2014-06-13 MED ORDER — FLUOXETINE HCL 10 MG PO CAPS: ORAL_CAPSULE | ORAL | Status: DC

## 2014-06-13 NOTE — Telephone Encounter (Signed)
OK X1 

## 2014-06-20 ENCOUNTER — Other Ambulatory Visit: Payer: Self-pay

## 2014-06-20 DIAGNOSIS — E039 Hypothyroidism, unspecified: Secondary | ICD-10-CM

## 2014-06-20 MED ORDER — LEVOTHYROXINE SODIUM 125 MCG PO TABS: ORAL_TABLET | ORAL | Status: DC

## 2014-06-28 ENCOUNTER — Telehealth: Payer: Self-pay

## 2014-06-28 NOTE — Telephone Encounter (Signed)
Medication and allergies:  Reviewed and updated  90 day supply/mail order: n/a Local pharmacy:  [facility] [place], [place] - [address]   Immunizations due:  See below   A/P: No changes to personal, family history or past surgical hx CCS- received one 7-8 years ago--normal--per patient Flu- 09/16/13 Tdap- unsure of last vaccine PNA- 12/10/11 Shingles- stated she's received one already, unsure of date, received at [facility] with Provider: Changing dentist and due to her hx of bilateral hip replacement and spinal surgery, she wants to know if Dr. Birdie Riddle or her new dentist should prescribe antibiotics for dental procedures.

## 2014-06-28 NOTE — Telephone Encounter (Signed)
Pt returned call.  She can be reached this afternoon at [phone].

## 2014-06-28 NOTE — Telephone Encounter (Signed)
Left message for call back Non-identifiable   Flu- 09/16/13 PNA- 12/10/11

## 2014-06-29 ENCOUNTER — Encounter: Payer: Self-pay | Admitting: Family Medicine

## 2014-06-29 ENCOUNTER — Ambulatory Visit (INDEPENDENT_AMBULATORY_CARE_PROVIDER_SITE_OTHER): Payer: Medicare Other | Admitting: Family Medicine

## 2014-06-29 VITALS — BP 140/78 | HR 72 | Temp 98.6°F | Resp 16 | Ht 63.0 in | Wt 182.0 lb

## 2014-06-29 DIAGNOSIS — E039 Hypothyroidism, unspecified: Secondary | ICD-10-CM

## 2014-06-29 DIAGNOSIS — Z0000 Encounter for general adult medical examination without abnormal findings: Secondary | ICD-10-CM | POA: Insufficient documentation

## 2014-06-29 DIAGNOSIS — E782 Mixed hyperlipidemia: Secondary | ICD-10-CM

## 2014-06-29 DIAGNOSIS — Z780 Asymptomatic menopausal state: Secondary | ICD-10-CM

## 2014-06-29 DIAGNOSIS — Z1231 Encounter for screening mammogram for malignant neoplasm of breast: Secondary | ICD-10-CM

## 2014-06-29 DIAGNOSIS — I10 Essential (primary) hypertension: Secondary | ICD-10-CM

## 2014-06-29 LAB — BASIC METABOLIC PANEL
BUN: 17 mg/dL (ref 6–23)
CO2: 29 mEq/L (ref 19–32)
Calcium: 9 mg/dL (ref 8.4–10.5)
Chloride: 104 mEq/L (ref 96–112)
Creatinine, Ser: 0.7 mg/dL (ref 0.4–1.2)
GFR: 87.81 mL/min (ref >60.00)
Glucose, Bld: 99 mg/dL (ref 70–99)
Potassium: 4.1 mEq/L (ref 3.5–5.1)
Sodium: 140 mEq/L (ref 135–145)

## 2014-06-29 LAB — CBC WITH DIFFERENTIAL/PLATELET
Basophils Absolute: 0 10*3/uL (ref 0.0–0.1)
Basophils Relative: 0.3 % (ref 0.0–3.0)
Eosinophils Absolute: 0.2 10*3/uL (ref 0.0–0.7)
Eosinophils Relative: 4.3 % (ref 0.0–5.0)
HCT: 37.3 % (ref 36.0–46.0)
Hemoglobin: 12.6 g/dL (ref 12.0–15.0)
Lymphocytes Relative: 24.3 % (ref 12.0–46.0)
Lymphs Abs: 1.4 10*3/uL (ref 0.7–4.0)
MCHC: 33.9 g/dL (ref 30.0–36.0)
MCV: 91.9 fl (ref 78.0–100.0)
Monocytes Absolute: 0.5 10*3/uL (ref 0.1–1.0)
Monocytes Relative: 9.6 % (ref 3.0–12.0)
Neutro Abs: 3.5 10*3/uL (ref 1.4–7.7)
Neutrophils Relative %: 61.5 % (ref 43.0–77.0)
Platelets: 191 10*3/uL (ref 150.0–400.0)
RBC: 4.07 Mil/uL (ref 3.87–5.11)
RDW: 14.1 % (ref 11.5–15.5)
WBC: 5.6 10*3/uL (ref 4.0–10.5)

## 2014-06-29 LAB — HEPATIC FUNCTION PANEL
ALT: 16 U/L (ref 0–35)
AST: 19 U/L (ref 0–37)
Albumin: 3.6 g/dL (ref 3.5–5.2)
Alkaline Phosphatase: 70 U/L (ref 39–117)
Bilirubin, Direct: 0 mg/dL (ref 0.0–0.3)
Total Bilirubin: 0.7 mg/dL (ref 0.2–1.2)
Total Protein: 6.5 g/dL (ref 6.0–8.3)

## 2014-06-29 LAB — LIPID PANEL
Cholesterol: 160 mg/dL (ref 0–200)
HDL: 40.2 mg/dL (ref >39.00)
LDL Cholesterol: 90 mg/dL (ref 0–99)
NonHDL: 119.8
Total CHOL/HDL Ratio: 4
Triglycerides: 147 mg/dL (ref 0.0–149.0)
VLDL: 29.4 mg/dL (ref 0.0–40.0)

## 2014-06-29 LAB — TSH: TSH: 0.88 u[IU]/mL (ref 0.35–4.50)

## 2014-06-29 NOTE — Progress Notes (Signed)
   Subjective:    Patient ID: Tasha Moss, female    DOB: 04-02-1936, 78 y.o.   MRN: [id number]  HPI Here today for CPE.  Risk Factors: HTN- chronic problem, not currently on medication.  Attempting to control w/ diet and exercise.  Denies CP, mild SOB w/ recent heat/humidity.  No HAs, visual changes, edema. Hyperlipidemia- chronic problem, on Pravastatin.  Denies abd pain, N/V, myalgias. Hypothyroid- chronic problem, on Levothyroxine,  Denies excessive fatigue Physical Activity: limited exercise due to arthritis. Fall Risk: mild risk- walks w/ cane Depression: chronic problem, on Prozac, no current sxs Hearing: normal to conversational tones, mildly decreased to whispered voice despite hearing aides ADL's: independent Cognitive: normal linear thought process, memory and attention intact Home Safety: safe at home, lives w/ husband. Height, Weight, BMI, Visual Acuity: see vitals, vision corrected to 20/20 w/ glasses Counseling: UTD on colonoscopy (2011), no need for pap, due for mammo and DEXA (was seeing Dr Derenda Mis in [place]) Labs Ordered: See A&P Care Plan: See A&P    Review of Systems Patient reports no vision/ hearing changes, adenopathy,fever, weight change,  persistant/recurrent hoarseness , swallowing issues, chest pain, palpitations, edema, persistant/recurrent cough, hemoptysis, gastrointestinal bleeding (melena, rectal bleeding), abdominal pain, significant heartburn, bowel changes, GU symptoms (dysuria, hematuria, incontinence), Gyn symptoms (abnormal  bleeding, pain),  syncope, focal weakness, memory loss, numbness & tingling, skin/hair/nail changes, abnormal bruising or bleeding, anxiety, or depression.  + mild SOB w/ exertion Needs abx for upcoming dental cleaning     Objective:   Physical Exam General Appearance:    Alert, cooperative, no distress, appears stated age  Head:    Normocephalic, without obvious abnormality, atraumatic  Eyes:    PERRL,  conjunctiva/corneas clear, EOM's intact, fundi    benign, both eyes  Ears:    Normal TM's and external ear canals, both ears  Nose:   Nares normal, septum midline, mucosa normal, no drainage    or sinus tenderness  Throat:   Lips, mucosa, and tongue normal; teeth and gums normal  Neck:   Supple, symmetrical, trachea midline, no adenopathy;    Thyroid: no enlargement/tenderness/nodules  Back:     Symmetric, no curvature, ROM normal, no CVA tenderness  Lungs:     Clear to auscultation bilaterally, respirations unlabored  Chest Wall:    No tenderness or deformity   Heart:    Regular rate and rhythm, S1 and S2 normal, no murmur, rub   or gallop  Breast Exam:    Deferred to mammo  Abdomen:     Soft, non-tender, bowel sounds active all four quadrants,    no masses, no organomegaly  Genitalia:    Deferred at pt's request  Rectal:    Extremities:   Extremities normal, atraumatic, no cyanosis or edema  Pulses:   2+ and symmetric all extremities  Skin:   Skin color, texture, turgor normal, no rashes or lesions  Lymph nodes:   Cervical, supraclavicular, and axillary nodes normal  Neurologic:   CNII-XII intact, normal strength, sensation and reflexes    throughout          Assessment & Plan:

## 2014-06-29 NOTE — Progress Notes (Signed)
Pre visit review using our clinic review tool, if applicable. No additional management support is needed unless otherwise documented below in the visit note. 

## 2014-06-29 NOTE — Patient Instructions (Signed)
Follow up in 6 months to recheck BP and cholesterol We'll notify you of your lab results and make any changes if needed We'll call you with your mammogram and bone density appt Based on the newest guidelines- there is no need for antibiotic treatments prior to dental cleanings Call with any questions or concerns Enjoy the rest of your summer!!

## 2014-07-03 NOTE — Assessment & Plan Note (Signed)
Chronic problem.  Adequate control.  Check labs.  Will continue to follow.

## 2014-07-03 NOTE — Assessment & Plan Note (Signed)
Chronic problem.  Tolerating statin w/o difficulty.  Check labs.  Adjust meds prn  

## 2014-07-03 NOTE — Assessment & Plan Note (Signed)
Pt's PE WNL.  UTD on colonoscopy.  Due for mammo and dexa.  No need for paps.  Written screening schedule updated and given to pt.  Check labs.  Anticipatory guidance provided.

## 2014-07-03 NOTE — Assessment & Plan Note (Signed)
Chronic problem.  Currently asymptomatic.  Check labs.  Adjust meds prn  

## 2014-07-08 LAB — HM MAMMOGRAPHY

## 2014-07-11 ENCOUNTER — Ambulatory Visit (INDEPENDENT_AMBULATORY_CARE_PROVIDER_SITE_OTHER)
Admission: RE | Admit: 2014-07-11 | Discharge: 2014-07-11 | Disposition: A | Discharge status: Home / Self Care | Payer: Medicare Other | Source: Ambulatory Visit | Attending: Family Medicine | Admitting: Family Medicine

## 2014-07-11 DIAGNOSIS — Z780 Asymptomatic menopausal state: Secondary | ICD-10-CM

## 2014-07-11 LAB — HM MAMMOGRAPHY

## 2014-07-18 ENCOUNTER — Other Ambulatory Visit: Payer: Self-pay | Admitting: Internal Medicine

## 2014-07-19 ENCOUNTER — Encounter: Payer: Self-pay | Admitting: General Practice

## 2014-07-19 ENCOUNTER — Other Ambulatory Visit: Payer: Self-pay

## 2014-07-19 MED ORDER — LEVOTHYROXINE SODIUM 125 MCG PO TABS: 125.0000 ug | ORAL_TABLET | Freq: Every day | ORAL | Status: DC

## 2014-07-21 DIAGNOSIS — Z780 Asymptomatic menopausal state: Secondary | ICD-10-CM

## 2014-07-22 ENCOUNTER — Telehealth: Payer: Self-pay | Admitting: Family Medicine

## 2014-07-22 DIAGNOSIS — E785 Hyperlipidemia, unspecified: Secondary | ICD-10-CM

## 2014-07-22 MED ORDER — PRAVASTATIN SODIUM 40 MG PO TABS: ORAL_TABLET | ORAL | Status: DC

## 2014-07-22 MED ORDER — OMEPRAZOLE 20 MG PO CPDR: DELAYED_RELEASE_CAPSULE | ORAL | Status: DC

## 2014-07-22 NOTE — Telephone Encounter (Signed)
Caller name: Giulia Relation to pt: self Call back number: [phone] [facility]: [phone]  Reason for call: inquiring about Mammography results? pt called regarding refill of pravastatin (PRAVACHOL) 40 MG tablet and omeprazole (PRILOSEC) 20 MG capsule

## 2014-07-22 NOTE — Telephone Encounter (Signed)
meds filled, pt notified we have no results on mammogram.

## 2014-09-19 ENCOUNTER — Other Ambulatory Visit: Payer: Self-pay | Admitting: Internal Medicine

## 2014-09-20 ENCOUNTER — Ambulatory Visit (INDEPENDENT_AMBULATORY_CARE_PROVIDER_SITE_OTHER): Payer: Medicare Other | Admitting: *Deleted

## 2014-09-20 DIAGNOSIS — Z23 Encounter for immunization: Secondary | ICD-10-CM

## 2014-09-21 ENCOUNTER — Other Ambulatory Visit: Payer: Self-pay | Admitting: Internal Medicine

## 2014-09-21 NOTE — Telephone Encounter (Signed)
Tasha Moss, you did CPX 06/22/2014. Thanks, [employer]

## 2014-12-22 ENCOUNTER — Telehealth: Payer: Self-pay | Admitting: Family Medicine

## 2014-12-22 NOTE — Telephone Encounter (Signed)
Caller name: Zakaria, Fromer  Relationship to patient: self  Can be reached:[phone] Pharmacy: [facility], [place] [phone] (Phone)     Reason for call: Pt requesting a refill FLUoxetine (PROZAC) 10 MG capsule

## 2014-12-27 ENCOUNTER — Other Ambulatory Visit: Payer: Self-pay | Admitting: General Practice

## 2014-12-27 MED ORDER — FLUOXETINE HCL 10 MG PO CAPS: 10.0000 mg | ORAL_CAPSULE | Freq: Every day | ORAL | Status: DC

## 2014-12-27 NOTE — Telephone Encounter (Signed)
Pt called today and was wondering about the status of her refill. Pt was advised that I had not received this message earlier, I apologized and filled the medication for pt.

## 2015-01-03 ENCOUNTER — Ambulatory Visit (INDEPENDENT_AMBULATORY_CARE_PROVIDER_SITE_OTHER): Payer: Medicare Other | Admitting: Family Medicine

## 2015-01-03 ENCOUNTER — Encounter: Payer: Self-pay | Admitting: Family Medicine

## 2015-01-03 VITALS — BP 140/82 | HR 73 | Temp 97.8°F | Resp 16 | Wt 191.2 lb

## 2015-01-03 DIAGNOSIS — I10 Essential (primary) hypertension: Secondary | ICD-10-CM

## 2015-01-03 DIAGNOSIS — Z23 Encounter for immunization: Secondary | ICD-10-CM | POA: Diagnosis not present

## 2015-01-03 DIAGNOSIS — E782 Mixed hyperlipidemia: Secondary | ICD-10-CM | POA: Diagnosis not present

## 2015-01-03 LAB — CBC WITH DIFFERENTIAL/PLATELET
Basophils Absolute: 0 10*3/uL (ref 0.0–0.1)
Basophils Relative: 0.4 % (ref 0.0–3.0)
Eosinophils Absolute: 0.3 10*3/uL (ref 0.0–0.7)
Eosinophils Relative: 4.3 % (ref 0.0–5.0)
HCT: 38.2 % (ref 36.0–46.0)
Hemoglobin: 13.1 g/dL (ref 12.0–15.0)
Lymphocytes Relative: 20.4 % (ref 12.0–46.0)
Lymphs Abs: 1.5 10*3/uL (ref 0.7–4.0)
MCHC: 34.1 g/dL (ref 30.0–36.0)
MCV: 90.3 fl (ref 78.0–100.0)
Monocytes Absolute: 0.6 10*3/uL (ref 0.1–1.0)
Monocytes Relative: 8.6 % (ref 3.0–12.0)
Neutro Abs: 4.8 10*3/uL (ref 1.4–7.7)
Neutrophils Relative %: 66.3 % (ref 43.0–77.0)
Platelets: 202 10*3/uL (ref 150.0–400.0)
RBC: 4.24 Mil/uL (ref 3.87–5.11)
RDW: 15 % (ref 11.5–15.5)
WBC: 7.2 10*3/uL (ref 4.0–10.5)

## 2015-01-03 LAB — BASIC METABOLIC PANEL
BUN: 17 mg/dL (ref 6–23)
CO2: 28 mEq/L (ref 19–32)
Calcium: 9 mg/dL (ref 8.4–10.5)
Chloride: 108 mEq/L (ref 96–112)
Creatinine, Ser: 0.63 mg/dL (ref 0.40–1.20)
GFR: 97.4 mL/min (ref >60.00)
Glucose, Bld: 112 mg/dL — ABNORMAL HIGH (ref 70–99)
Potassium: 3.7 mEq/L (ref 3.5–5.1)
Sodium: 142 mEq/L (ref 135–145)

## 2015-01-03 LAB — HEPATIC FUNCTION PANEL
ALT: 18 U/L (ref 0–35)
AST: 21 U/L (ref 0–37)
Albumin: 3.6 g/dL (ref 3.5–5.2)
Alkaline Phosphatase: 79 U/L (ref 39–117)
Bilirubin, Direct: 0.1 mg/dL (ref 0.0–0.3)
Total Bilirubin: 0.4 mg/dL (ref 0.2–1.2)
Total Protein: 6.3 g/dL (ref 6.0–8.3)

## 2015-01-03 LAB — LIPID PANEL
Cholesterol: 144 mg/dL (ref 0–200)
HDL: 39.2 mg/dL (ref >39.00)
LDL Cholesterol: 69 mg/dL (ref 0–99)
NonHDL: 104.8
Total CHOL/HDL Ratio: 4
Triglycerides: 181 mg/dL — ABNORMAL HIGH (ref 0.0–149.0)
VLDL: 36.2 mg/dL (ref 0.0–40.0)

## 2015-01-03 NOTE — Assessment & Plan Note (Signed)
Chronic problem.  Pt's BP returned to normal reading by end of OV.  No need to start meds at this time.  Pt is asymptomatic.  Will continue to follow.

## 2015-01-03 NOTE — Progress Notes (Signed)
Pre visit review using our clinic review tool, if applicable. No additional management support is needed unless otherwise documented below in the visit note. 

## 2015-01-03 NOTE — Assessment & Plan Note (Signed)
Chronic problem, tolerating meds w/o difficulty.  Check labs.  Adjust meds prn

## 2015-01-03 NOTE — Progress Notes (Signed)
   Subjective:    Patient ID: Tasha Moss, female    DOB: [birth year], 79 y.o.   MRN: [id number]  HPI HTN- chronic problem, not currently on meds.  BP is much higher than previous (140/78).  Has gained 10 lbs.  Pt admits to some anxiety regarding travel this AM in the ice/snow.  No CP, HAs other than sinus, SOB, visual changes, edema.  Hyperlipidemia- chronic problem, on Pravastatin.  No abd pain, N/v, myalgias.   Review of Systems For ROS see HPI     Objective:   Physical Exam  Constitutional: She is oriented to person, place, and time. She appears well-developed and well-nourished. No distress.  HENT:  Head: Normocephalic and atraumatic.  Eyes: Conjunctivae and EOM are normal. Pupils are equal, round, and reactive to light.  Neck: Normal range of motion. Neck supple. No thyromegaly present.  Cardiovascular: Normal rate, regular rhythm, normal heart sounds and intact distal pulses.   No murmur heard. Pulmonary/Chest: Effort normal and breath sounds normal. No respiratory distress.  Abdominal: Soft. She exhibits no distension. There is no tenderness.  Musculoskeletal: She exhibits no edema.  Lymphadenopathy:    She has no cervical adenopathy.  Neurological: She is alert and oriented to person, place, and time.  Skin: Skin is warm and dry.  Psychiatric: She has a normal mood and affect. Her behavior is normal.  Vitals reviewed.         Assessment & Plan:

## 2015-01-03 NOTE — Patient Instructions (Signed)
Follow up in 4-6 weeks to recheck BP We'll notify you of your lab results and make any changes if needed Try and make healthy food choices and get regular exercise Call with any questions or concerns Happy Early Birthday!!!

## 2015-01-04 ENCOUNTER — Other Ambulatory Visit (INDEPENDENT_AMBULATORY_CARE_PROVIDER_SITE_OTHER): Payer: Medicare Other

## 2015-01-04 DIAGNOSIS — R7309 Other abnormal glucose: Secondary | ICD-10-CM

## 2015-01-04 DIAGNOSIS — R739 Hyperglycemia, unspecified: Secondary | ICD-10-CM

## 2015-01-04 LAB — HEMOGLOBIN A1C: Hgb A1c MFr Bld: 5.5 % (ref 4.6–6.5)

## 2015-01-05 ENCOUNTER — Telehealth: Payer: Self-pay | Admitting: Family Medicine

## 2015-01-05 MED ORDER — LEVOTHYROXINE SODIUM 125 MCG PO TABS: 125.0000 ug | ORAL_TABLET | Freq: Every day | ORAL | Status: DC

## 2015-01-05 NOTE — Telephone Encounter (Signed)
Caller name: Kitty Relation to pt: self Call back number: [phone] [facility]acy: [facility] in [place] on [address]  Reason for call:   Patient requesting levothyroxine refill

## 2015-01-05 NOTE — Telephone Encounter (Signed)
Med filled.  

## 2015-01-18 ENCOUNTER — Telehealth: Payer: Self-pay | Admitting: Family Medicine

## 2015-01-18 MED ORDER — OMEPRAZOLE 20 MG PO CPDR: DELAYED_RELEASE_CAPSULE | ORAL | Status: DC

## 2015-01-18 NOTE — Telephone Encounter (Signed)
Caller name: Sameera Relation to pt: self Call back number: [phone] [facility] [place]  Reason for call:   Requesting a refill of omeprazole

## 2015-01-18 NOTE — Telephone Encounter (Signed)
Med filled.  

## 2015-02-10 ENCOUNTER — Encounter: Payer: Self-pay | Admitting: Family Medicine

## 2015-02-10 ENCOUNTER — Ambulatory Visit (INDEPENDENT_AMBULATORY_CARE_PROVIDER_SITE_OTHER): Payer: Medicare Other | Admitting: Family Medicine

## 2015-02-10 VITALS — BP 130/80 | HR 83 | Temp 97.9°F | Resp 16 | Wt 190.5 lb

## 2015-02-10 DIAGNOSIS — I10 Essential (primary) hypertension: Secondary | ICD-10-CM

## 2015-02-10 DIAGNOSIS — H6121 Impacted cerumen, right ear: Secondary | ICD-10-CM | POA: Diagnosis not present

## 2015-02-10 DIAGNOSIS — H6120 Impacted cerumen, unspecified ear: Secondary | ICD-10-CM | POA: Insufficient documentation

## 2015-02-10 NOTE — Progress Notes (Signed)
   Subjective:    Patient ID: Tasha Moss, female    DOB: 03-May-1936, 79 y.o.   MRN: [id number]  HPI HTN- chronic problem.  Has been controlled w/ low salt diet.  Not currently on meds.  Denies CP, SOB, HAs, visual changes, edema.   Cerumen impaction- pt reports both ears are blocked and she is unable to wear her hearing aides.  Has been using OTC drops but 'it hasn't worked'   Review of Systems For ROS see HPI     Objective:   Physical Exam  Constitutional: She is oriented to person, place, and time. She appears well-developed and well-nourished. No distress.  HENT:  Head: Normocephalic and atraumatic.  Dry flakes of cerumen in L ear canal- no obstruction, TM WNL Minimal wax in R ear canal- most removed w/ curette and only small amount remains against TM  Eyes: Conjunctivae and EOM are normal. Pupils are equal, round, and reactive to light.  Neck: Normal range of motion. Neck supple. No thyromegaly present.  Cardiovascular: Normal rate, regular rhythm, normal heart sounds and intact distal pulses.   No murmur heard. Pulmonary/Chest: Effort normal and breath sounds normal. No respiratory distress.  Abdominal: Soft. She exhibits no distension. There is no tenderness.  Musculoskeletal: She exhibits no edema.  Lymphadenopathy:    She has no cervical adenopathy.  Neurological: She is alert and oriented to person, place, and time.  Skin: Skin is warm and dry.  Psychiatric: She has a normal mood and affect. Her behavior is normal.  Vitals reviewed.         Assessment & Plan:

## 2015-02-10 NOTE — Progress Notes (Signed)
Pre visit review using our clinic review tool, if applicable. No additional management support is needed unless otherwise documented below in the visit note. 

## 2015-02-10 NOTE — Assessment & Plan Note (Signed)
Chronic problem.  Remains well controlled w/ diet at this time.  Asymptomatic.  No changes.

## 2015-02-10 NOTE — Assessment & Plan Note (Signed)
New.  Pt w/ minimal blockage today on exam.  Able to remove most of the remaining wax from R ear w/ only a small amount remaining against the TM.  Pt, husband and I decided not to remove this due to risk of vertigo w/ irrigation.  Pt to continue ear drops at home as these have been very successful.

## 2015-02-10 NOTE — Patient Instructions (Signed)
Schedule your complete physical anytime after 06/30/15 Continue to use the ear drops at night to prevent wax buildup Your ears are currently clear- this is great news! Your blood pressure looks great!  No changes at this time! Call with any questions or concerns Happy Spring!

## 2015-02-16 DIAGNOSIS — Z961 Presence of intraocular lens: Secondary | ICD-10-CM | POA: Diagnosis not present

## 2015-03-15 ENCOUNTER — Emergency Department (HOSPITAL_COMMUNITY): Payer: No Typology Code available for payment source

## 2015-03-15 ENCOUNTER — Encounter (HOSPITAL_COMMUNITY): Payer: Self-pay | Admitting: Emergency Medicine

## 2015-03-15 ENCOUNTER — Emergency Department (HOSPITAL_COMMUNITY)
Admission: EM | Admit: 2015-03-15 | Discharge: 2015-03-15 | Disposition: A | Discharge status: Home / Self Care | Payer: No Typology Code available for payment source | Attending: Emergency Medicine | Admitting: Emergency Medicine

## 2015-03-15 DIAGNOSIS — Y998 Other external cause status: Secondary | ICD-10-CM | POA: Insufficient documentation

## 2015-03-15 DIAGNOSIS — S299XXA Unspecified injury of thorax, initial encounter: Secondary | ICD-10-CM | POA: Insufficient documentation

## 2015-03-15 DIAGNOSIS — Y92410 Unspecified street and highway as the place of occurrence of the external cause: Secondary | ICD-10-CM | POA: Diagnosis not present

## 2015-03-15 DIAGNOSIS — Y9389 Activity, other specified: Secondary | ICD-10-CM | POA: Insufficient documentation

## 2015-03-15 DIAGNOSIS — R0789 Other chest pain: Secondary | ICD-10-CM | POA: Diagnosis not present

## 2015-03-15 DIAGNOSIS — S298XXA Other specified injuries of thorax, initial encounter: Secondary | ICD-10-CM

## 2015-03-15 DIAGNOSIS — S199XXA Unspecified injury of neck, initial encounter: Secondary | ICD-10-CM | POA: Insufficient documentation

## 2015-03-15 DIAGNOSIS — S3992XA Unspecified injury of lower back, initial encounter: Secondary | ICD-10-CM | POA: Diagnosis not present

## 2015-03-15 MED ORDER — TRAMADOL HCL 50 MG PO TABS: 50.0000 mg | ORAL_TABLET | Freq: Four times a day (QID) | ORAL | Status: DC | PRN
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
## 2015-03-15 NOTE — ED Notes (Signed)
Patient transported to X-ray 
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
## 2015-03-20 ENCOUNTER — Other Ambulatory Visit: Payer: Self-pay | Admitting: Family Medicine

## 2015-03-29 NOTE — Telephone Encounter (Signed)
error:[id number] ° °

## 2015-04-14 DIAGNOSIS — H6121 Impacted cerumen, right ear: Secondary | ICD-10-CM | POA: Diagnosis not present

## 2015-04-14 DIAGNOSIS — H9011 Conductive hearing loss, unilateral, right ear, with unrestricted hearing on the contralateral side: Secondary | ICD-10-CM | POA: Diagnosis not present

## 2015-06-07 DIAGNOSIS — B078 Other viral warts: Secondary | ICD-10-CM | POA: Diagnosis not present

## 2015-06-07 DIAGNOSIS — L821 Other seborrheic keratosis: Secondary | ICD-10-CM | POA: Diagnosis not present

## 2015-06-07 DIAGNOSIS — L814 Other melanin hyperpigmentation: Secondary | ICD-10-CM | POA: Diagnosis not present

## 2015-06-13 ENCOUNTER — Telehealth: Payer: Self-pay | Admitting: Family Medicine

## 2015-06-13 NOTE — Telephone Encounter (Signed)
pre visit letter mailed 06/13/15 °

## 2015-06-25 ENCOUNTER — Other Ambulatory Visit: Payer: Self-pay | Admitting: Family Medicine

## 2015-06-26 NOTE — Telephone Encounter (Signed)
Med filled.  

## 2015-06-30 ENCOUNTER — Telehealth: Payer: Self-pay | Admitting: *Deleted

## 2015-06-30 NOTE — Telephone Encounter (Signed)
Unable to reach patient at time of Pre-Visit Call.  Left message for patient to return call when available.    

## 2015-07-03 ENCOUNTER — Encounter: Payer: Self-pay | Admitting: Family Medicine

## 2015-07-03 ENCOUNTER — Ambulatory Visit (INDEPENDENT_AMBULATORY_CARE_PROVIDER_SITE_OTHER): Payer: Medicare Other | Admitting: Family Medicine

## 2015-07-03 VITALS — BP 128/66 | HR 71 | Temp 98.2°F | Ht 65.0 in | Wt 185.4 lb

## 2015-07-03 DIAGNOSIS — I10 Essential (primary) hypertension: Secondary | ICD-10-CM

## 2015-07-03 DIAGNOSIS — H6123 Impacted cerumen, bilateral: Secondary | ICD-10-CM

## 2015-07-03 DIAGNOSIS — E890 Postprocedural hypothyroidism: Secondary | ICD-10-CM | POA: Diagnosis not present

## 2015-07-03 DIAGNOSIS — Z0000 Encounter for general adult medical examination without abnormal findings: Secondary | ICD-10-CM | POA: Diagnosis not present

## 2015-07-03 DIAGNOSIS — E782 Mixed hyperlipidemia: Secondary | ICD-10-CM | POA: Diagnosis not present

## 2015-07-03 NOTE — Progress Notes (Signed)
Pre visit review using our clinic review tool, if applicable. No additional management support is needed unless otherwise documented below in the visit note. 

## 2015-07-03 NOTE — Patient Instructions (Signed)
Follow up in 6 months to recheck blood pressure and cholesterol We'll notify you of your lab results and make any changes if needed Get your mammogram as scheduled You are up to date on colonoscopy and bone density Keep up the good work on healthy diet and regular exercise Call with any questions or concerns Enjoy the rest of your summer!!!

## 2015-07-03 NOTE — Progress Notes (Signed)
   Subjective:    Patient ID: Tasha Moss, female    DOB: 06/20/1936, 79 y.o.   MRN: [id number]  HPI Here today for CPE.  Risk Factors: HTN- chronic problem, not currently on meds.  Well controlled.  Denies CP, SOB, HAs, visual changes, edema. Hyperlipidemia- chronic problem, on Pravastatin and fenofibrate.  Denies abd pain, N/V, myalgias Hypothyroid- chronic problem, on Levothyroxine daily.  Currently asymptomatic. Physical Activity: walking regularly Fall Risk: mildly increased, will use a cane as needed Depression: denies current sxs, currently on Prozac Hearing: some difficulty despite hearing aides ADL's: independent Cognitive: normal linear thought process, memory and attention intact Home Safety: safe at home, lives w/ husband Height, Weight, BMI, Visual Acuity: see vitals, vision corrected to 20/20 w/ glasses Counseling: UTD on colonoscopy (2011), due for mammo next month (has appt), not due for DEXA until next year.  UTD on immunizations. Care team reviewed and updated w/ pt Labs Ordered: See A&P Care Plan: See A&P    Review of Systems Patient reports no vision/ hearing changes, adenopathy,fever, weight change,  persistant/recurrent hoarseness , swallowing issues, chest pain, palpitations, edema, persistant/recurrent cough, hemoptysis, dyspnea (rest/exertional/paroxysmal nocturnal), gastrointestinal bleeding (melena, rectal bleeding), abdominal pain, significant heartburn, bowel changes, GU symptoms (dysuria, hematuria, incontinence), Gyn symptoms (abnormal  bleeding, pain),  syncope, focal weakness, memory loss, numbness & tingling, skin/hair/nail changes, abnormal bruising or bleeding, anxiety, or depression.     Objective:   Physical Exam General Appearance:    Alert, cooperative, no distress, appears stated age  Head:    Normocephalic, without obvious abnormality, atraumatic  Eyes:    PERRL, conjunctiva/corneas clear, EOM's intact, fundi    benign, both eyes  Ears:     TMs obscured by cerumen bilaterally  Nose:   Nares normal, septum midline, mucosa normal, no drainage    or sinus tenderness  Throat:   Lips, mucosa, and tongue normal; teeth and gums normal  Neck:   Supple, symmetrical, trachea midline, no adenopathy;    Thyroid: no enlargement/tenderness/nodules  Back:     Symmetric, no curvature, ROM normal, no CVA tenderness  Lungs:     Clear to auscultation bilaterally, respirations unlabored  Chest Wall:    No tenderness or deformity   Heart:    Regular rate and rhythm, S1 and S2 normal, no murmur, rub   or gallop  Breast Exam:    Deferred to mammo  Abdomen:     Soft, non-tender, bowel sounds active all four quadrants,    no masses, no organomegaly  Genitalia:    Deferred  Rectal:    Extremities:   Extremities normal, atraumatic, no cyanosis or edema  Pulses:   2+ and symmetric all extremities  Skin:   Skin color, texture, turgor normal, no rashes or lesions  Lymph nodes:   Cervical, supraclavicular, and axillary nodes normal  Neurologic:   CNII-XII intact, normal strength, sensation and reflexes    throughout          Assessment & Plan:

## 2015-07-04 LAB — CBC WITH DIFFERENTIAL/PLATELET
Basophils Absolute: 0.1 10*3/uL (ref 0.0–0.1)
Basophils Relative: 0.9 % (ref 0.0–3.0)
Eosinophils Absolute: 0.4 10*3/uL (ref 0.0–0.7)
Eosinophils Relative: 5.1 % — ABNORMAL HIGH (ref 0.0–5.0)
HCT: 38.8 % (ref 36.0–46.0)
Hemoglobin: 13.2 g/dL (ref 12.0–15.0)
Lymphocytes Relative: 20.5 % (ref 12.0–46.0)
Lymphs Abs: 1.5 10*3/uL (ref 0.7–4.0)
MCHC: 34 g/dL (ref 30.0–36.0)
MCV: 90.1 fl (ref 78.0–100.0)
Monocytes Absolute: 0.7 10*3/uL (ref 0.1–1.0)
Monocytes Relative: 9.7 % (ref 3.0–12.0)
Neutro Abs: 4.7 10*3/uL (ref 1.4–7.7)
Neutrophils Relative %: 63.8 % (ref 43.0–77.0)
Platelets: 205 10*3/uL (ref 150.0–400.0)
RBC: 4.3 Mil/uL (ref 3.87–5.11)
RDW: 14.4 % (ref 11.5–15.5)
WBC: 7.3 10*3/uL (ref 4.0–10.5)

## 2015-07-04 LAB — HEPATIC FUNCTION PANEL
ALT: 13 U/L (ref 0–35)
AST: 16 U/L (ref 0–37)
Albumin: 3.6 g/dL (ref 3.5–5.2)
Alkaline Phosphatase: 73 U/L (ref 39–117)
Bilirubin, Direct: 0.1 mg/dL (ref 0.0–0.3)
Total Bilirubin: 0.5 mg/dL (ref 0.2–1.2)
Total Protein: 6.8 g/dL (ref 6.0–8.3)

## 2015-07-04 LAB — LIPID PANEL
Cholesterol: 160 mg/dL (ref 0–200)
HDL: 36 mg/dL — ABNORMAL LOW (ref >39.00)
NonHDL: 124
Total CHOL/HDL Ratio: 4
Triglycerides: 279 mg/dL — ABNORMAL HIGH (ref 0.0–149.0)
VLDL: 55.8 mg/dL — ABNORMAL HIGH (ref 0.0–40.0)

## 2015-07-04 LAB — BASIC METABOLIC PANEL
BUN: 19 mg/dL (ref 6–23)
CO2: 27 mEq/L (ref 19–32)
Calcium: 9.4 mg/dL (ref 8.4–10.5)
Chloride: 104 mEq/L (ref 96–112)
Creatinine, Ser: 0.65 mg/dL (ref 0.40–1.20)
GFR: 93.83 mL/min (ref >60.00)
Glucose, Bld: 101 mg/dL — ABNORMAL HIGH (ref 70–99)
Potassium: 3.6 mEq/L (ref 3.5–5.1)
Sodium: 141 mEq/L (ref 135–145)

## 2015-07-04 LAB — TSH: TSH: 2.29 u[IU]/mL (ref 0.35–4.50)

## 2015-07-04 LAB — LDL CHOLESTEROL, DIRECT: Direct LDL: 89 mg/dL

## 2015-07-05 MED ORDER — FENOFIBRATE 160 MG PO TABS: 160.0000 mg | ORAL_TABLET | Freq: Every day | ORAL | Status: DC

## 2015-07-07 NOTE — Assessment & Plan Note (Signed)
Recurrent problem for pt.  She is not interested in irrigation due to hx of vertigo.  Pt reports she will call ENT.

## 2015-07-07 NOTE — Assessment & Plan Note (Signed)
Chronic problem.  Currently asymptomatic.  Check labs.  Adjust meds prn  

## 2015-07-07 NOTE — Assessment & Plan Note (Signed)
Chronic problem.  Tolerating pravastatin and fenofibrate w/o difficulty.  Check labs.  Adjust meds prn

## 2015-07-07 NOTE — Assessment & Plan Note (Signed)
Chronic problem.  Not currently on meds.  Pt's BP is well controlled.  Asymptomatic.  No plans to start meds at this time.

## 2015-07-07 NOTE — Assessment & Plan Note (Signed)
Pt's PE unchanged from previous and appropriate for age.  Pt has mammo appt scheduled.  UTD on DEXA.  UTD on colonoscopy- due 2021.  Check labs.  Anticipatory guidance provided.

## 2015-07-10 ENCOUNTER — Other Ambulatory Visit: Payer: Self-pay | Admitting: Family Medicine

## 2015-07-10 DIAGNOSIS — Z1231 Encounter for screening mammogram for malignant neoplasm of breast: Secondary | ICD-10-CM | POA: Diagnosis not present

## 2015-07-10 LAB — HM MAMMOGRAPHY

## 2015-07-10 NOTE — Telephone Encounter (Signed)
Medication filled to pharmacy as requested.   

## 2015-07-25 ENCOUNTER — Encounter: Payer: Self-pay | Admitting: General Practice

## 2015-07-28 ENCOUNTER — Telehealth: Payer: Self-pay | Admitting: General Practice

## 2015-07-28 NOTE — Telephone Encounter (Signed)
Pt husband advised at his appointment today that the pt is having a hard time tolerating her fenofibrate. He states that she is having severe muscle pain.

## 2015-07-28 NOTE — Telephone Encounter (Signed)
Pt can stop medication.  Pay close attention to low fat diet

## 2015-07-28 NOTE — Telephone Encounter (Signed)
Pt husband notified 

## 2015-09-11 ENCOUNTER — Telehealth: Payer: Self-pay | Admitting: Family Medicine

## 2015-09-11 NOTE — Telephone Encounter (Signed)
Relation to SH:UOHF Call back number: [phone]  Pharmacy: [facility] [address] [place] - [place] [address] [phone] (Phone) [phone] (Fax)         Reason for call:  Dr. Vertell Limber informed patient that PCP should prescribe meloxicam 7.5 mg. Patient is coming in Wednesday 09/13/15 for her flu shot and patient would like to schedule an appointment with PCP can i use same day. Please advise.

## 2015-09-12 NOTE — Telephone Encounter (Signed)
Patient scheduled for 09/13/15 at 2:45am

## 2015-09-12 NOTE — Telephone Encounter (Signed)
Ok to use same day. Cancel her off of the nurse visit schedule. I will give shot in room if ok with provider.

## 2015-09-13 ENCOUNTER — Encounter: Payer: Self-pay | Admitting: Family Medicine

## 2015-09-13 ENCOUNTER — Ambulatory Visit (INDEPENDENT_AMBULATORY_CARE_PROVIDER_SITE_OTHER): Payer: Medicare Other | Admitting: Family Medicine

## 2015-09-13 VITALS — BP 130/70 | HR 84 | Temp 98.1°F | Resp 16 | Wt 184.1 lb

## 2015-09-13 DIAGNOSIS — Z23 Encounter for immunization: Secondary | ICD-10-CM | POA: Diagnosis not present

## 2015-09-13 DIAGNOSIS — G2581 Restless legs syndrome: Secondary | ICD-10-CM | POA: Diagnosis not present

## 2015-09-13 DIAGNOSIS — M4806 Spinal stenosis, lumbar region: Secondary | ICD-10-CM

## 2015-09-13 DIAGNOSIS — M48061 Spinal stenosis, lumbar region without neurogenic claudication: Secondary | ICD-10-CM

## 2015-09-13 MED ORDER — MELOXICAM 7.5 MG PO TABS: 7.5000 mg | ORAL_TABLET | Freq: Two times a day (BID) | ORAL | Status: DC

## 2015-09-13 NOTE — Progress Notes (Signed)
   Subjective:    Patient ID: Tasha Moss, female    DOB: 12/30/1935, 79 y.o.   MRN: [id number]  HPI Spinal stenosis- chronic problem for pt.  Was started on mobic by Dr Vertell Limber ~2 yrs ago.  Pt takes 7.5mg  BID w/ good control of sxs.  Has been out of medication and pain is much worse.  Pt has not seen Dr Vertell Limber in 2 yrs and she was told to call PCP to get more meds.  RLS- pt reports she is not able to rest.  Will take Ropinirole as needed.  'some nights I can rest and some nights I just can't sleep'.   Review of Systems For ROS see HPI     Objective:   Physical Exam  Constitutional: She is oriented to person, place, and time. She appears well-developed and well-nourished. No distress.  HENT:  Head: Normocephalic and atraumatic.  Cardiovascular: Intact distal pulses.   Musculoskeletal: She exhibits no edema.  Neurological: She is alert and oriented to person, place, and time.  Skin: Skin is warm and dry.  Psychiatric: She has a normal mood and affect. Her behavior is normal. Thought content normal.  Vitals reviewed.         Assessment & Plan:

## 2015-09-13 NOTE — Patient Instructions (Signed)
Follow up as scheduled in January Restart the Mobic twice daily- take w/ food Make sure you are taking the ropinirole nightly- every night- for the restless leg If the restless leg doesn't improve after taking the medication nightly, please let me know so we can increase the dose Call with any questions or concerns Happy Fall!!!

## 2015-09-13 NOTE — Assessment & Plan Note (Signed)
Chronic problem.  Was seeing Dr Vertell Limber who had her on twice daily Mobic dosing.  Pt was told that she can no longer get refills from Dr Vertell Limber b/c she has not been seen in over 2 yrs.  Will refill medication so pt can continue her treatment.  Pt expressed understanding and is in agreement w/ plan.

## 2015-09-13 NOTE — Assessment & Plan Note (Signed)
Ongoing issue for pt.  She reports that some nights she is not able to sleep due to her sxs but she is not taking her medication nightly as directed.  Plan is for pt to start nightly medication and if sxs still aren't controlled we will increase the dose.  Pt expressed understanding and is in agreement w/ plan.

## 2015-09-18 ENCOUNTER — Other Ambulatory Visit: Payer: Self-pay | Admitting: Family Medicine

## 2015-09-18 NOTE — Telephone Encounter (Signed)
Medication Detail      Disp Refills Start End     FLUoxetine (PROZAC) 10 MG capsule 90 capsule 0 06/26/2015     Sig: TAKE ONE CAPSULE BY MOUTH ONCE DAILY AT BEDTIME    E-Prescribing Status: Receipt confirmed by pharmacy (06/26/2015 9:28 AM EDT)     Pharmacy    [facility] [place], [place] - [id number]   Medication Detail      Disp Refills Start End     levothyroxine (SYNTHROID, LEVOTHROID) 125 MCG tablet 90 tablet 0 06/26/2015     Sig: TAKE ONE TABLET BY MOUTH ONCE DAILY BEFORE BREAKFAST    E-Prescribing Status: Receipt confirmed by pharmacy (06/26/2015 9:28 AM EDT)     LOV: 07.25.16 ROV: 6-Mths. Refill sent per [facility] refill protocol/SLS

## 2015-10-09 ENCOUNTER — Encounter: Payer: Self-pay | Admitting: Gastroenterology

## 2015-12-11 ENCOUNTER — Other Ambulatory Visit: Payer: Self-pay | Admitting: Family Medicine

## 2015-12-12 NOTE — Telephone Encounter (Signed)
Medication filled to pharmacy as requested.   

## 2016-01-03 ENCOUNTER — Encounter: Payer: Self-pay | Admitting: Family Medicine

## 2016-01-03 ENCOUNTER — Ambulatory Visit (INDEPENDENT_AMBULATORY_CARE_PROVIDER_SITE_OTHER): Payer: Medicare Other | Admitting: Family Medicine

## 2016-01-03 VITALS — BP 133/73 | HR 79 | Temp 98.0°F | Ht 65.0 in | Wt 184.8 lb

## 2016-01-03 DIAGNOSIS — E782 Mixed hyperlipidemia: Secondary | ICD-10-CM

## 2016-01-03 DIAGNOSIS — I10 Essential (primary) hypertension: Secondary | ICD-10-CM

## 2016-01-03 LAB — HEPATIC FUNCTION PANEL
ALT: 17 U/L (ref 0–35)
AST: 19 U/L (ref 0–37)
Albumin: 3.9 g/dL (ref 3.5–5.2)
Alkaline Phosphatase: 75 U/L (ref 39–117)
Bilirubin, Direct: 0.1 mg/dL (ref 0.0–0.3)
Total Bilirubin: 0.5 mg/dL (ref 0.2–1.2)
Total Protein: 6.6 g/dL (ref 6.0–8.3)

## 2016-01-03 LAB — BASIC METABOLIC PANEL
BUN: 21 mg/dL (ref 6–23)
CO2: 31 mEq/L (ref 19–32)
Calcium: 9.2 mg/dL (ref 8.4–10.5)
Chloride: 104 mEq/L (ref 96–112)
Creatinine, Ser: 0.68 mg/dL (ref 0.40–1.20)
GFR: 88.95 mL/min (ref >60.00)
Glucose, Bld: 110 mg/dL — ABNORMAL HIGH (ref 70–99)
Potassium: 4.1 mEq/L (ref 3.5–5.1)
Sodium: 140 mEq/L (ref 135–145)

## 2016-01-03 LAB — CBC WITH DIFFERENTIAL/PLATELET
Basophils Absolute: 0 10*3/uL (ref 0.0–0.1)
Basophils Relative: 0.4 % (ref 0.0–3.0)
Eosinophils Absolute: 0.3 10*3/uL (ref 0.0–0.7)
Eosinophils Relative: 4.2 % (ref 0.0–5.0)
HCT: 40.7 % (ref 36.0–46.0)
Hemoglobin: 13.6 g/dL (ref 12.0–15.0)
Lymphocytes Relative: 23.8 % (ref 12.0–46.0)
Lymphs Abs: 1.6 10*3/uL (ref 0.7–4.0)
MCHC: 33.5 g/dL (ref 30.0–36.0)
MCV: 90.6 fl (ref 78.0–100.0)
Monocytes Absolute: 0.5 10*3/uL (ref 0.1–1.0)
Monocytes Relative: 7.7 % (ref 3.0–12.0)
Neutro Abs: 4.4 10*3/uL (ref 1.4–7.7)
Neutrophils Relative %: 63.9 % (ref 43.0–77.0)
Platelets: 201 10*3/uL (ref 150.0–400.0)
RBC: 4.49 Mil/uL (ref 3.87–5.11)
RDW: 14.4 % (ref 11.5–15.5)
WBC: 6.9 10*3/uL (ref 4.0–10.5)

## 2016-01-03 LAB — LIPID PANEL
Cholesterol: 164 mg/dL (ref 0–200)
HDL: 42.1 mg/dL (ref >39.00)
LDL Cholesterol: 87 mg/dL (ref 0–99)
NonHDL: 121.97
Total CHOL/HDL Ratio: 4
Triglycerides: 173 mg/dL — ABNORMAL HIGH (ref 0.0–149.0)
VLDL: 34.6 mg/dL (ref 0.0–40.0)

## 2016-01-03 NOTE — Assessment & Plan Note (Signed)
Chronic problem.  Tolerating statin w/o difficulty.  Stressed importance of healthy diet and regular activity.  Check labs.  Adjust meds prn

## 2016-01-03 NOTE — Progress Notes (Signed)
   Subjective:    Patient ID: Tasha Moss, female    DOB: December 06, 1936, 80 y.o.   MRN: [id number]  HPI Hyperlipidemia- chronic problem, on Pravastatin daily.  Denies abd pain, N/V, myalgias.  HTN- chronic problem, currently not on any medication and BP is well controlled.  Denies CP, SOB, HAs, visual changes, edema.  Review of Systems For ROS see HPI     Objective:   Physical Exam  Constitutional: She is oriented to person, place, and time. She appears well-developed and well-nourished. No distress.  HENT:  Head: Normocephalic and atraumatic.  Eyes: Conjunctivae and EOM are normal. Pupils are equal, round, and reactive to light.  Neck: Normal range of motion. Neck supple. No thyromegaly present.  Cardiovascular: Normal rate, regular rhythm, normal heart sounds and intact distal pulses.   No murmur heard. Pulmonary/Chest: Effort normal and breath sounds normal. No respiratory distress.  Abdominal: Soft. She exhibits no distension. There is no tenderness.  Musculoskeletal: She exhibits no edema.  Lymphadenopathy:    She has no cervical adenopathy.  Neurological: She is alert and oriented to person, place, and time.  Skin: Skin is warm and dry.  Psychiatric: She has a normal mood and affect. Her behavior is normal.  Vitals reviewed.         Assessment & Plan:

## 2016-01-03 NOTE — Assessment & Plan Note (Signed)
Chronic problem.  Adequate control.  Asymptomatic.  Check labs.  No anticipated med changes 

## 2016-01-03 NOTE — Progress Notes (Signed)
Pre visit review using our clinic review tool, if applicable. No additional management support is needed unless otherwise documented below in the visit note. 

## 2016-01-03 NOTE — Patient Instructions (Signed)
Schedule your complete physical in 6 months We'll notify you of your lab results and make any changes if needed Continue to work on healthy diet and regular exercise- you look great! Call with any questions or concerns If you want to join [place] at the new [place] office, any scheduled appointments will automatically transfer and we will see you at [address] [place] [address] [place], [place] [address] ([place]) Happy Early Rudene Anda!!!

## 2016-01-08 ENCOUNTER — Other Ambulatory Visit: Payer: Self-pay | Admitting: Family Medicine

## 2016-01-09 NOTE — Telephone Encounter (Signed)
Medication filled to pharmacy as requested.   

## 2016-02-10 ENCOUNTER — Emergency Department (HOSPITAL_COMMUNITY)
Admission: EM | Admit: 2016-02-10 | Discharge: 2016-02-10 | Disposition: A | Discharge status: Home / Self Care | Payer: Medicare Other | Attending: Emergency Medicine | Admitting: Emergency Medicine

## 2016-02-10 ENCOUNTER — Encounter (HOSPITAL_COMMUNITY): Payer: Self-pay | Admitting: Emergency Medicine

## 2016-02-10 ENCOUNTER — Emergency Department (HOSPITAL_COMMUNITY): Payer: Medicare Other

## 2016-02-10 DIAGNOSIS — S42292A Other displaced fracture of upper end of left humerus, initial encounter for closed fracture: Secondary | ICD-10-CM | POA: Diagnosis not present

## 2016-02-10 DIAGNOSIS — Y998 Other external cause status: Secondary | ICD-10-CM | POA: Diagnosis not present

## 2016-02-10 DIAGNOSIS — Y9389 Activity, other specified: Secondary | ICD-10-CM | POA: Diagnosis not present

## 2016-02-10 DIAGNOSIS — S4992XA Unspecified injury of left shoulder and upper arm, initial encounter: Secondary | ICD-10-CM | POA: Diagnosis present

## 2016-02-10 DIAGNOSIS — E039 Hypothyroidism, unspecified: Secondary | ICD-10-CM | POA: Diagnosis not present

## 2016-02-10 DIAGNOSIS — K219 Gastro-esophageal reflux disease without esophagitis: Secondary | ICD-10-CM | POA: Diagnosis not present

## 2016-02-10 DIAGNOSIS — E785 Hyperlipidemia, unspecified: Secondary | ICD-10-CM | POA: Insufficient documentation

## 2016-02-10 DIAGNOSIS — M1990 Unspecified osteoarthritis, unspecified site: Secondary | ICD-10-CM | POA: Diagnosis not present

## 2016-02-10 DIAGNOSIS — Z791 Long term (current) use of non-steroidal anti-inflammatories (NSAID): Secondary | ICD-10-CM | POA: Diagnosis not present

## 2016-02-10 DIAGNOSIS — I10 Essential (primary) hypertension: Secondary | ICD-10-CM | POA: Insufficient documentation

## 2016-02-10 DIAGNOSIS — F419 Anxiety disorder, unspecified: Secondary | ICD-10-CM | POA: Diagnosis not present

## 2016-02-10 DIAGNOSIS — S42202A Unspecified fracture of upper end of left humerus, initial encounter for closed fracture: Secondary | ICD-10-CM

## 2016-02-10 DIAGNOSIS — Y92009 Unspecified place in unspecified non-institutional (private) residence as the place of occurrence of the external cause: Secondary | ICD-10-CM | POA: Diagnosis not present

## 2016-02-10 DIAGNOSIS — R110 Nausea: Secondary | ICD-10-CM | POA: Diagnosis not present

## 2016-02-10 DIAGNOSIS — Z79899 Other long term (current) drug therapy: Secondary | ICD-10-CM | POA: Insufficient documentation

## 2016-02-10 DIAGNOSIS — W01198A Fall on same level from slipping, tripping and stumbling with subsequent striking against other object, initial encounter: Secondary | ICD-10-CM | POA: Insufficient documentation

## 2016-02-10 MED ORDER — FENTANYL CITRATE (PF) 100 MCG/2ML IJ SOLN
12.5000 ug | Freq: Once | INTRAMUSCULAR | Status: AC
  Administered 2016-02-10: 12.5 ug via INTRAVENOUS
  Filled 2016-02-10: qty 2

## 2016-02-10 MED ORDER — ONDANSETRON HCL 4 MG/2ML IJ SOLN
4.0000 mg | Freq: Once | INTRAMUSCULAR | Status: AC
  Administered 2016-02-10: 4 mg via INTRAVENOUS
  Filled 2016-02-10: qty 2

## 2016-02-10 MED ORDER — ONDANSETRON HCL 4 MG PO TABS: 4.0000 mg | ORAL_TABLET | Freq: Four times a day (QID) | ORAL | Status: DC

## 2016-02-10 MED ORDER — ONDANSETRON 4 MG PO TBDP
4.0000 mg | ORAL_TABLET | Freq: Once | ORAL | Status: DC | PRN
  Filled 2016-02-10: qty 1

## 2016-02-10 MED ORDER — HYDROCODONE-ACETAMINOPHEN 2.5-325 MG PO TABS: 1.0000 | ORAL_TABLET | Freq: Four times a day (QID) | ORAL | Status: DC | PRN

## 2016-02-10 NOTE — Discharge Instructions (Signed)
Humerus Fracture Treated With Immobilization °The humerus is the large bone in your upper arm. You have a broken (fractured) humerus. These fractures are easily diagnosed with X-rays. °TREATMENT  °Simple fractures which will heal without disability are treated with simple immobilization. Immobilization means you will wear a cast, splint, or sling. You have a fracture which will do well with immobilization. The fracture will heal well simply by being held in a good position until it is stable enough to begin range of motion exercises. Do not take part in activities which would further injure your arm.  °HOME CARE INSTRUCTIONS  °· Put ice on the injured area. °¨ Put ice in a plastic bag. °¨ Place a towel between your skin and the bag. °¨ Leave the ice on for 15-20 minutes, 03-04 times a day. °· If you have a cast: °¨ Do not scratch the skin under the cast using sharp or pointed objects. °¨ Check the skin around the cast every day. You may put lotion on any red or sore areas. °¨ Keep your cast dry and clean. °· If you have a splint: °¨ Wear the splint as directed. °¨ Keep your splint dry and clean. °¨ You may loosen the elastic around the splint if your fingers become numb, tingle, or turn cold or blue. °· If you have a sling: °¨ Wear the sling as directed. °· Do not put pressure on any part of your cast or splint until it is fully hardened. °· Your cast or splint can be protected during bathing with a plastic bag. Do not lower the cast or splint into water. °· Only take over-the-counter or prescription medicines for pain, discomfort, or fever as directed by your caregiver. °· Do range of motion exercises as instructed by your caregiver. °· Follow up as directed by your caregiver. This is very important in order to avoid permanent injury or disability and chronic pain. °SEEK IMMEDIATE MEDICAL CARE IF:  °· Your skin or nails in the injured arm turn blue or gray. °· Your arm feels cold or numb. °· You develop severe pain  in the injured arm. °· You are having problems with the medicines you were given. °MAKE SURE YOU:  °· Understand these instructions. °· Will watch your condition. °· Will get help right away if you are not doing well or get worse. °  °This information is not intended to replace advice given to you by your health care provider. Make sure you discuss any questions you have with your health care provider. °  °Document Released: 03/03/2001 Document Revised: 12/16/2014 Document Reviewed: 04/19/2015 °Elsevier Interactive Patient Education ©2016 Elsevier Inc. ° °

## 2016-02-10 NOTE — ED Notes (Signed)
Bed: [id number] Expected date: 02/10/16 Expected time: 7:11 PM Means of arrival: Ambulance Comments: Fall, shoulder pain

## 2016-02-10 NOTE — ED Provider Notes (Signed)
CSN: [id number]     Arrival date & time 02/10/16  1927 History   First MD Initiated Contact with Patient 02/10/16 1939     Chief Complaint  Patient presents with  . Shoulder Injury    fall  . Nausea      HPI Pt tripped on a rug at home and fell hitting her left shoulder. Pt is allergic to morphine and per pt and her husband "she can not tolerate pain medications"  Past Medical History  Diagnosis Date  . Allergy     seasonal & dust  . Anxiety   . Arthritis     DJD  . GERD (gastroesophageal reflux disease)   . Thyroid disease     hypothyroidism post Hashimoto's & partial thyroidectomy  . Hyperlipidemia   . Hypertension    Past Surgical History  Procedure Laterality Date  . Colonoscopy      diverticulosis  . Joint replacement  [place]    THR bilaterally   . Spine surgery      cervical ; Dr Vertell Limber  . Tonsillectomy and adenoidectomy    . Thyroidectomy, partial      benign nodule  . Lumbar disc repair  1989    Dr Arrie Aran  . Vitreous detachment      Dr Claudean Kinds  . Cataract extraction w/ intraocular lens  implant, bilateral     Family History  Problem Relation Age of Onset  . Diabetes Mother   . Hepatitis Mother     Hepatitis B,? etiology  . Miscarriages / [place] Mother   . Heart disease Father     CHF  . Cancer Paternal Aunt     GI cancers  . Heart disease Paternal Aunt     CHF  . Cancer Paternal Uncle     lung, GI  . Heart disease Paternal Uncle     CHF  . Stroke Mother 47   Social History  Substance Use Topics  . Smoking status: Never Smoker   . Smokeless tobacco: None  . Alcohol Use: No   OB History    No data available     Review of Systems  Unable to perform ROS: Acuity of condition      Allergies  Gabapentin and Morphine  Home Medications   Prior to Admission medications   Medication Sig Start Date End Date Taking? Authorizing Provider  Cholecalciferol (VITAMIN D3) 1000 UNITS CAPS Take 1,000 Units by mouth daily.       Historical Provider, MD  Cranberry 250 MG CAPS Take 250 mg by mouth daily.      Historical Provider, MD  FLUoxetine (PROZAC) 10 MG capsule TAKE ONE CAPSULE BY MOUTH ONCE DAILY AT BEDTIME 12/12/15   [name], MD  glucosamine-chondroitin 500-400 MG tablet Take 1 tablet by mouth daily.      Historical Provider, MD  Hydrocodone-Acetaminophen 2.5-325 MG TABS Take 1 tablet by mouth 4 (four) times daily as needed. 02/10/16   Leonard Schwartz, MD  levothyroxine (SYNTHROID, LEVOTHROID) 125 MCG tablet TAKE ONE TABLET BY MOUTH ONCE DAILY BEFORE BREAKFAST 12/12/15   [name], MD  loratadine (CLARITIN) 10 MG tablet Take 10 mg by mouth daily.      Historical Provider, MD  meloxicam (MOBIC) 7.5 MG tablet TAKE ONE TABLET BY MOUTH TWICE DAILY 01/09/16   [name], MD  Multiple Vitamin (MULTIVITAMIN) tablet Take 1 tablet by mouth daily.      Historical Provider, MD  omeprazole (PRILOSEC) 20  MG capsule TAKE ONE CAPSULE BY MOUTH ONCE DAILY IN THE MORNING 30 MINUTES BEFORE BREAKFAST 01/09/16   [name], MD  ondansetron (ZOFRAN) 4 MG tablet Take 1 tablet (4 mg total) by mouth every 6 (six) hours. 02/10/16   Leonard Schwartz, MD  pravastatin (PRAVACHOL) 40 MG tablet TAKE ONE TABLET BY MOUTH AT BEDTIME FOR CHOLESTEROL 01/09/16   [name], MD  Pumpkin Seed-Soy Germ (AZO BLADDER CONTROL/GO-LESS PO) Take 1 tablet by mouth daily.    Historical Provider, MD  rOPINIRole (REQUIP) 0.25 MG tablet 1 qhs Patient taking differently: Take 0.25 mg by mouth at bedtime. 1 qhs 06/21/13   Hendricks Limes, MD  vitamin B-12 (CYANOCOBALAMIN) 250 MCG tablet Take 250 mcg by mouth daily.      Historical Provider, MD   BP 175/67 mmHg  Pulse 139  Temp(Src) 98 F (36.7 C) (Oral)  Resp 15  SpO2 96% Physical Exam  Constitutional: She is oriented to person, place, and time. She appears well-developed and well-nourished. No distress.  HENT:  Head: Normocephalic and atraumatic.  Eyes: Pupils are equal, round, and  reactive to light.  Neck: Normal range of motion.  Cardiovascular: Normal rate and intact distal pulses.   Pulmonary/Chest: No respiratory distress.  Abdominal: Normal appearance. She exhibits no distension.  Musculoskeletal:       Left shoulder: She exhibits decreased range of motion, tenderness and bony tenderness.  Neurological: She is alert and oriented to person, place, and time. No cranial nerve deficit.  Skin: Skin is warm and dry. No rash noted.  Psychiatric: She has a normal mood and affect. Her behavior is normal.  Nursing note and vitals reviewed.   ED Course  Procedures (including critical care time) Medications  ondansetron (ZOFRAN-ODT) disintegrating tablet 4 mg (not administered)  ondansetron (ZOFRAN) injection 4 mg (4 mg Intravenous Given 02/10/16 2008)  fentaNYL (SUBLIMAZE) injection 12.5 mcg (12.5 mcg Intravenous Given 02/10/16 2009)  fentaNYL (SUBLIMAZE) injection 12.5 mcg (12.5 mcg Intravenous Given 02/10/16 2120)  ondansetron (ZOFRAN) injection 4 mg (4 mg Intravenous Given 02/10/16 2157)    Labs Review Labs Reviewed - No data to display  Imaging Review Dg Shoulder Left  02/10/2016  CLINICAL DATA:  Tripped on rug at home. EXAM: LEFT SHOULDER - 2+ VIEW COMPARISON:  None FINDINGS: There is a impacted, comminuted fracture involving the proximal left humerus. No dislocation. IMPRESSION: 1. Impacted and comminuted fracture involves the proximal left humerus. Electronically Signed   By: Kerby Moors M.D.   On: 02/10/2016 20:53   I have personally reviewed and evaluated these images and lab results as part of my medical decision-making.  Dr. Fredonia Highland reviewed the x-rays and recommended a sling be placed and patient will be seen at 8:30 AM on Monday morning.  MDM   Final diagnoses:  Proximal humerus fracture, left, closed, initial encounter        Leonard Schwartz, MD 02/10/16 2200

## 2016-02-10 NOTE — ED Notes (Signed)
Pt tripped on a rug at home and fell hitting her left shoulder. Pt is allergic to morphine and per pt and her husband "she can not tolerate pain medications"

## 2016-02-12 ENCOUNTER — Other Ambulatory Visit: Payer: Self-pay | Admitting: Orthopedic Surgery

## 2016-02-12 DIAGNOSIS — M25512 Pain in left shoulder: Secondary | ICD-10-CM

## 2016-02-13 ENCOUNTER — Telehealth: Payer: Self-pay | Admitting: Family Medicine

## 2016-02-13 NOTE — Telephone Encounter (Signed)
Called and spoke with pt husband. He advised that Dr. Percell Miller is completing the surgery and his office should have already faxed the surgical clearance papers. Pt last had EKG in 2014. Surgery will be done 4 weeks from completion of paperwork.   Dr. Percell Miller has given pt pain medication.

## 2016-02-13 NOTE — Telephone Encounter (Signed)
Waiting on paperwork and then will schedule pt.

## 2016-02-13 NOTE — Telephone Encounter (Signed)
Pt will need surgical clearance appt

## 2016-02-13 NOTE — Telephone Encounter (Signed)
Received surgical clearance paperwork; forwarded to CMA [Jessica] desk/SLS 03/07

## 2016-02-13 NOTE — Telephone Encounter (Signed)
Caller name:Brugger,Charlie Relation to [id number]  Call back number:[phone]   Reason for call:  Spouse would like to speak with Janett Billow regarding patient shoulder pain surgery, spouse states patient is in a lot of pain therefore if she doesn't have to schedule an acute appointment he would rather not. Spouse would like to speak with Janett Billow.

## 2016-02-13 NOTE — Telephone Encounter (Signed)
Scheduled for tomorrow - pt husband states they also want to discuss [place] being ordered

## 2016-02-14 ENCOUNTER — Encounter: Payer: Self-pay | Admitting: Family Medicine

## 2016-02-14 ENCOUNTER — Ambulatory Visit (INDEPENDENT_AMBULATORY_CARE_PROVIDER_SITE_OTHER): Payer: Medicare Other | Admitting: Family Medicine

## 2016-02-14 VITALS — BP 146/70 | HR 73 | Temp 98.2°F | Resp 16 | Ht 65.0 in | Wt 182.4 lb

## 2016-02-14 DIAGNOSIS — S42202A Unspecified fracture of upper end of left humerus, initial encounter for closed fracture: Secondary | ICD-10-CM

## 2016-02-14 DIAGNOSIS — Z01818 Encounter for other preprocedural examination: Secondary | ICD-10-CM | POA: Diagnosis not present

## 2016-02-14 NOTE — Patient Instructions (Signed)
Follow up as needed Home health is ordered and surgical clearance complete GOOD LUCK!!! Hang in there!!!

## 2016-02-14 NOTE — Progress Notes (Signed)
Pre visit review using our clinic review tool, if applicable. No additional management support is needed unless otherwise documented below in the visit note. 

## 2016-02-14 NOTE — Telephone Encounter (Signed)
Noted, pt has an appt on 02/14/16

## 2016-02-14 NOTE — Progress Notes (Signed)
Subjective:    Tasha Moss is a 80 y.o. female who presents to the office today for a preoperative consultation at the request of surgeon Dr Percell Miller who plans on performing L total shoulder replacement on TBD  . This consultation is requested for the specific conditions prompting preoperative evaluation (i.e. because of potential affect on operative risk): HTN/Hyperlipidemia. Planned anesthesia: general. The patient has the following known anesthesia issues: past general anesthesia with complications (post op N/V particularly w/ inhaled gases). Patients bleeding risk: no recent abnormal bleeding, no remote history of abnormal bleeding and no use of Ca-channel blockers. Patient does not have objections to receiving blood products if needed.  The following portions of the patient's history were reviewed and updated as appropriate: allergies, current medications, past family history, past medical history, past social history, past surgical history and problem list.  Pt w/ complex, comminuted L proximal humerus fracture.  She is now in need of personal care services- bathing, dressing.  Would also benefit from some strengthening and mobility exercises for her lower body while she is recovering and awaiting surgery on her shoulder.  Review of Systems + nausea due to pain medication   Denies CP, SOB, HAs, visual changes, abd pain, edema   Objective:       General Appearance:    Alert, cooperative, no distress, appears stated age  Head:    Normocephalic, without obvious abnormality, atraumatic  Eyes:    PERRL, conjunctiva/corneas clear, EOM's intact, fundi    benign, both eyes  Ears:    Normal TM's and external ear canals, both ears  Nose:   Nares normal, septum midline, mucosa normal, no drainage    or sinus tenderness  Throat:   Lips, mucosa, and tongue normal; teeth and gums normal  Neck:   Supple, symmetrical, trachea midline, no adenopathy;    Thyroid: no enlargement/tenderness/nodules   Back:     Symmetric, no curvature, ROM normal, no CVA tenderness  Lungs:     Clear to auscultation bilaterally, respirations unlabored  Chest Wall:    No tenderness or deformity   Heart:    Regular rate and rhythm, S1 and S2 normal, no murmur, rub   or gallop  Breast Exam:    Deferred to GYN  Abdomen:     Soft, non-tender, bowel sounds active all four quadrants,    no masses, no organomegaly  Genitalia:    Deferred to GYN  Rectal:    Extremities:   L arm in sling w/ extensive bruising  Pulses:   2+ and symmetric all extremities  Skin:   Skin color, texture, turgor normal, no rashes or lesions  Lymph nodes:   Cervical, supraclavicular, and axillary nodes normal  Neurologic:   CNII-XII intact     Predictors of intubation difficulty:  Morbid obesity? no  Anatomically abnormal facies? no  Prominent incisors? no  Receding mandible? no  Short, thick neck? no  Neck range of motion: normal  Dentition: No chipped, loose, or missing teeth.  Cardiographics ECG: NSR w/ 2 PACs, possible LVH but EKG was done w/ pt sitting in chair Echocardiogram: not done  Imaging Chest x-ray: NA   Lab Review  Office Visit on 01/03/2016  Component Date Value  . Cholesterol 01/03/2016 164   . Triglycerides 01/03/2016 173.0*  . HDL 01/03/2016 42.10   . VLDL 01/03/2016 34.6   . LDL Cholesterol 01/03/2016 87   . Total CHOL/HDL Ratio 01/03/2016 4   . NonHDL 01/03/2016 121.97   .  Sodium 01/03/2016 140   . Potassium 01/03/2016 4.1   . Chloride 01/03/2016 104   . CO2 01/03/2016 31   . Glucose, Bld 01/03/2016 110*  . BUN 01/03/2016 21   . Creatinine, Ser 01/03/2016 0.68   . Calcium 01/03/2016 9.2   . GFR 01/03/2016 88.95   . Total Bilirubin 01/03/2016 0.5   . Bilirubin, Direct 01/03/2016 0.1   . Alkaline Phosphatase 01/03/2016 75   . AST 01/03/2016 19   . ALT 01/03/2016 17   . Total Protein 01/03/2016 6.6   . Albumin 01/03/2016 3.9   . WBC 01/03/2016 6.9   . RBC 01/03/2016 4.49   . Hemoglobin  01/03/2016 13.6   . HCT 01/03/2016 40.7   . MCV 01/03/2016 90.6   . MCHC 01/03/2016 33.5   . RDW 01/03/2016 14.4   . Platelets 01/03/2016 201.0   . Neutrophils Relative % 01/03/2016 63.9   . Lymphocytes Relative 01/03/2016 23.8   . Monocytes Relative 01/03/2016 7.7   . Eosinophils Relative 01/03/2016 4.2   . Basophils Relative 01/03/2016 0.4   . Neutro Abs 01/03/2016 4.4   . Lymphs Abs 01/03/2016 1.6   . Monocytes Absolute 01/03/2016 0.5   . Eosinophils Absolute 01/03/2016 0.3   . Basophils Absolute 01/03/2016 0.0       Assessment:      80 y.o. female with planned surgery as above.   Known risk factors for perioperative complications: None   Difficulty with intubation is not anticipated.  Cardiac Risk Estimation: low  Current medications which may produce withdrawal symptoms if withheld perioperatively: none      Plan:    1. Preoperative workup as follows ECG. 2. Change in medication regimen before surgery: discontinue NSAIDs (Mobic- stopped 3/6) 14 days before surgery. 3. Prophylaxis for cardiac events with perioperative beta-blockers: should be considered, specific regimen per anesthesia. 4. Invasive hemodynamic monitoring perioperatively: at the discretion of anesthesiologist. 5. Deep vein thrombosis prophylaxis postoperatively:regimen to be chosen by surgical team. 6. Surveillance for postoperative MI with ECG immediately postoperatively and on postoperative days 1 and 2 AND troponin levels 24 hours postoperatively and on day 4 or hospital discharge (whichever comes first): at the discretion of anesthesiologist. 7. Other measures: NA

## 2016-02-16 ENCOUNTER — Ambulatory Visit
Admission: RE | Admit: 2016-02-16 | Discharge: 2016-02-16 | Disposition: A | Discharge status: Home / Self Care | Payer: Medicare Other | Source: Ambulatory Visit | Attending: Orthopedic Surgery | Admitting: Orthopedic Surgery

## 2016-02-16 DIAGNOSIS — M25512 Pain in left shoulder: Secondary | ICD-10-CM

## 2016-02-19 ENCOUNTER — Encounter (HOSPITAL_COMMUNITY)
Admission: RE | Admit: 2016-02-19 | Discharge: 2016-02-19 | Disposition: A | Discharge status: Home / Self Care | Payer: Medicare Other | Source: Ambulatory Visit | Attending: Orthopedic Surgery | Admitting: Orthopedic Surgery

## 2016-02-19 ENCOUNTER — Encounter (HOSPITAL_COMMUNITY): Payer: Self-pay

## 2016-02-19 DIAGNOSIS — S42202A Unspecified fracture of upper end of left humerus, initial encounter for closed fracture: Secondary | ICD-10-CM | POA: Insufficient documentation

## 2016-02-19 DIAGNOSIS — X58XXXA Exposure to other specified factors, initial encounter: Secondary | ICD-10-CM | POA: Diagnosis not present

## 2016-02-19 DIAGNOSIS — Z01812 Encounter for preprocedural laboratory examination: Secondary | ICD-10-CM | POA: Insufficient documentation

## 2016-02-19 DIAGNOSIS — Z0183 Encounter for blood typing: Secondary | ICD-10-CM | POA: Diagnosis not present

## 2016-02-19 HISTORY — DX: Family history of other specified conditions: Z84.89

## 2016-02-19 HISTORY — DX: Hypothyroidism, unspecified: E03.9

## 2016-02-19 HISTORY — DX: Other specified postprocedural states: Z98.890

## 2016-02-19 HISTORY — DX: Personal history of other specified conditions: Z87.898

## 2016-02-19 HISTORY — DX: Other specified postprocedural states: R11.2

## 2016-02-19 LAB — SURGICAL PCR SCREEN
MRSA, PCR: NEGATIVE
Staphylococcus aureus: NEGATIVE

## 2016-02-19 LAB — CBC
HCT: 35.3 % — ABNORMAL LOW (ref 36.0–46.0)
Hemoglobin: 11.7 g/dL — ABNORMAL LOW (ref 12.0–15.0)
MCH: 29.8 pg (ref 26.0–34.0)
MCHC: 33.1 g/dL (ref 30.0–36.0)
MCV: 90.1 fL (ref 78.0–100.0)
Platelets: 258 10*3/uL (ref 150–400)
RBC: 3.92 MIL/uL (ref 3.87–5.11)
RDW: 15 % (ref 11.5–15.5)
WBC: 8.4 10*3/uL (ref 4.0–10.5)

## 2016-02-19 LAB — BASIC METABOLIC PANEL
Anion gap: 13 (ref 5–15)
BUN: 13 mg/dL (ref 4–21)
BUN: 13 mg/dL (ref 6–20)
CO2: 26 mmol/L (ref 22–32)
Calcium: 8.8 mg/dL — ABNORMAL LOW (ref 8.9–10.3)
Chloride: 103 mmol/L (ref 101–111)
Creatinine, Ser: 0.65 mg/dL (ref 0.44–1.00)
Creatinine: 0.6 mg/dL (ref 0.5–1.1)
GFR calc Af Amer: >60 mL/min (ref >60)
GFR calc non Af Amer: >60 mL/min (ref >60)
Glucose, Bld: 92 mg/dL (ref 65–99)
Glucose: 92 mg/dL
Potassium: 3.4 mmol/L — ABNORMAL LOW (ref 3.5–5.1)
Sodium: 142 mmol/L (ref 135–145)
Sodium: 142 mmol/L (ref 137–147)

## 2016-02-19 LAB — TYPE AND SCREEN
ABO/RH(D): O POS
Antibody Screen: NEGATIVE

## 2016-02-19 LAB — PROTIME-INR
INR: 1.11 (ref 0.00–1.49)
Prothrombin Time: 14.5 seconds (ref 11.6–15.2)

## 2016-02-19 LAB — CBC AND DIFFERENTIAL: WBC: 8.4 10^3/mL

## 2016-02-19 NOTE — Progress Notes (Signed)
Patient denies chest pain, shortness of breath. PCP Dr. Birdie Riddle. No cardiology visit or cardiac test

## 2016-02-19 NOTE — Pre-Procedure Instructions (Signed)
Tasha Moss  02/19/2016      Your procedure is scheduled on March 21.  Report to [facility] Admitting at 5:30 A.M.  Call this number if you have problems the morning of surgery:  [phone]   Remember:  Do not eat food or drink liquids after midnight.  Take these medicines the morning of surgery with A SIP OF WATER Levothyroxine, Omeprazole, Zofran, Loratadine,    STOP Vitamin D, Cranberry, Glucosamine, Meloxicam, Multiple Vitamin, Azo, Vitamin B12 starting today   STOP/ Do not take Aspirin, Aleve, Naproxen, Advil, Ibuprofen, Motrin, Vitamins, Herbs, or Supplements starting today   Do not wear jewelry, make-up or nail polish.  Do not wear lotions, powders, or perfumes.  You may wear deodorant.  Do not shave 48 hours prior to surgery.  Men may shave face and neck.  Do not bring valuables to the hospital.  [facility] is not responsible for any belongings or valuables.  Contacts, dentures or bridgework may not be worn into surgery.  Leave your suitcase in the car.  After surgery it may be brought to your room.  For patients admitted to the hospital, discharge time will be determined by your treatment team.  Patients discharged the day of surgery will not be allowed to drive home.   [facility] - Preparing for Surgery  Before surgery, you can play an important role.  Because skin is not sterile, your skin needs to be as free of germs as possible.  You can reduce the number of germs on you skin by washing with CHG (chlorahexidine gluconate) soap before surgery.  CHG is an antiseptic cleaner which kills germs and bonds with the skin to continue killing germs even after washing.  Please DO NOT use if you have an allergy to CHG or antibacterial soaps.  If your skin becomes reddened/irritated stop using the CHG and inform your nurse when you arrive at Short Stay.  Do not shave (including legs and underarms) for at least 48 hours prior to the first CHG shower.  You may  shave your face.  Please follow these instructions carefully:   1.  Shower with CHG Soap the night before surgery and the morning of Surgery.  2.  If you choose to wash your hair, wash your hair first as usual with your normal shampoo.  3.  After you shampoo, rinse your hair and body thoroughly to remove the shampoo.  4.  Use CHG as you would any other liquid soap.  You can apply CHG directly to the skin and wash gently with scrungie or a clean washcloth.  5.  Apply the CHG Soap to your body ONLY FROM THE NECK DOWN.  Do not use on open wounds or open sores.  Avoid contact with your eyes, ears, mouth and genitals (private parts).  Wash genitals (private parts) with your normal soap.  6.  Wash thoroughly, paying special attention to the area where your surgery will be performed.  7.  Thoroughly rinse your body with warm water from the neck down.  8.  DO NOT shower/wash with your normal soap after using and rinsing off the CHG Soap.  9.  Pat yourself dry with a clean towel.            10.  Wear clean pajamas.            11.  Place clean sheets on your bed the night of your first shower and do not sleep with  pets.  Day of Surgery  Do not apply any lotions the morning of surgery.  Please wear clean clothes to the hospital/surgery center.   Please read over the following fact sheets that you were given. Pain Booklet, Coughing and Deep Breathing, Blood Transfusion Information and Surgical Site Infection Prevention

## 2016-02-20 ENCOUNTER — Telehealth: Payer: Self-pay | Admitting: General Practice

## 2016-02-20 ENCOUNTER — Telehealth: Payer: Self-pay | Admitting: Family Medicine

## 2016-02-20 DIAGNOSIS — S42202A Unspecified fracture of upper end of left humerus, initial encounter for closed fracture: Secondary | ICD-10-CM

## 2016-02-20 LAB — ABO/RH: ABO/RH(D): O POS

## 2016-02-20 NOTE — Telephone Encounter (Signed)
Please advise, per referral notes, pt is going to be receiving care from [facility].

## 2016-02-20 NOTE — Telephone Encounter (Signed)
Please advise this referral for home health was placed 02/13/16.

## 2016-02-20 NOTE — Telephone Encounter (Signed)
Caller name:Charlie Relationship to patient:spouse Can be [phone] Pharmacy: [facility]  Reason for call:home health therapist suggested a lift chair because of the crushed shoulder.

## 2016-02-20 NOTE — Telephone Encounter (Signed)
-----   Message from Quintin Alto sent at 02/19/2016  3:43 PM EDT ----- Regarding: [place]: [phone] Plse call patient's husband. States he saw you and you told him to call you if he did not hear from [facility]

## 2016-02-21 LAB — URINE CULTURE

## 2016-02-21 NOTE — Telephone Encounter (Signed)
Yes- agree w/ Lift Chair.  [facility] for DME prescription

## 2016-02-21 NOTE — Telephone Encounter (Signed)
Called and spoke with pt husband he advised that [facility] will not help with the lift chair. Pt husband ordered pt a chair out of pocket and it will be delivered on Friday. He thanked [place] for all of our time.

## 2016-02-26 MED ORDER — CEFAZOLIN SODIUM-DEXTROSE 2-3 GM-% IV SOLR
2.0000 g | INTRAVENOUS | Status: AC
  Administered 2016-02-27: 2 g via INTRAVENOUS
  Filled 2016-02-26: qty 50

## 2016-02-26 MED ORDER — DEXTROSE-NACL 5-0.45 % IV SOLN: 100.0000 mL/h | INTRAVENOUS | Status: DC

## 2016-02-26 MED ORDER — ACETAMINOPHEN 500 MG PO TABS
1000.0000 mg | ORAL_TABLET | Freq: Once | ORAL | Status: AC
  Administered 2016-02-27: 1000 mg via ORAL
  Filled 2016-02-26: qty 2

## 2016-02-27 ENCOUNTER — Inpatient Hospital Stay (HOSPITAL_COMMUNITY)
Admission: RE | Admit: 2016-02-27 | Discharge: 2016-03-01 | DRG: 483 | Disposition: A | Discharge status: Skilled Nursing Facility | Payer: Medicare Other | Source: Ambulatory Visit | Attending: Orthopedic Surgery | Admitting: Orthopedic Surgery

## 2016-02-27 ENCOUNTER — Encounter (HOSPITAL_COMMUNITY)
Admission: RE | Disposition: A | Discharge status: Skilled Nursing Facility | Payer: Self-pay | Source: Ambulatory Visit | Attending: Orthopedic Surgery

## 2016-02-27 ENCOUNTER — Inpatient Hospital Stay (HOSPITAL_COMMUNITY): Payer: Medicare Other | Admitting: Anesthesiology

## 2016-02-27 ENCOUNTER — Encounter (HOSPITAL_COMMUNITY): Payer: Self-pay | Admitting: *Deleted

## 2016-02-27 ENCOUNTER — Inpatient Hospital Stay (HOSPITAL_COMMUNITY): Payer: Medicare Other

## 2016-02-27 DIAGNOSIS — E785 Hyperlipidemia, unspecified: Secondary | ICD-10-CM | POA: Diagnosis present

## 2016-02-27 DIAGNOSIS — F419 Anxiety disorder, unspecified: Secondary | ICD-10-CM | POA: Diagnosis present

## 2016-02-27 DIAGNOSIS — K219 Gastro-esophageal reflux disease without esophagitis: Secondary | ICD-10-CM | POA: Diagnosis present

## 2016-02-27 DIAGNOSIS — E039 Hypothyroidism, unspecified: Secondary | ICD-10-CM | POA: Diagnosis present

## 2016-02-27 DIAGNOSIS — T148XXA Other injury of unspecified body region, initial encounter: Secondary | ICD-10-CM

## 2016-02-27 DIAGNOSIS — X58XXXA Exposure to other specified factors, initial encounter: Secondary | ICD-10-CM | POA: Diagnosis present

## 2016-02-27 DIAGNOSIS — R531 Weakness: Secondary | ICD-10-CM | POA: Diagnosis present

## 2016-02-27 DIAGNOSIS — J302 Other seasonal allergic rhinitis: Secondary | ICD-10-CM | POA: Diagnosis present

## 2016-02-27 DIAGNOSIS — I10 Essential (primary) hypertension: Secondary | ICD-10-CM | POA: Diagnosis present

## 2016-02-27 DIAGNOSIS — S42202A Unspecified fracture of upper end of left humerus, initial encounter for closed fracture: Secondary | ICD-10-CM | POA: Diagnosis present

## 2016-02-27 DIAGNOSIS — S42209A Unspecified fracture of upper end of unspecified humerus, initial encounter for closed fracture: Secondary | ICD-10-CM | POA: Diagnosis present

## 2016-02-27 HISTORY — PX: TOTAL SHOULDER ARTHROPLASTY: SHX126

## 2016-02-27 LAB — GLUCOSE, CAPILLARY: Glucose-Capillary: 106 mg/dL — ABNORMAL HIGH (ref 65–99)

## 2016-02-27 SURGERY — ARTHROPLASTY, SHOULDER, TOTAL
Anesthesia: Regional | Site: Shoulder | Laterality: Left

## 2016-02-27 MED ORDER — LACTATED RINGERS IV SOLN
INTRAVENOUS | Status: DC
  Administered 2016-02-27 (×3): via INTRAVENOUS

## 2016-02-27 MED ORDER — CEFAZOLIN SODIUM-DEXTROSE 2-3 GM-% IV SOLR
2.0000 g | Freq: Four times a day (QID) | INTRAVENOUS | Status: AC
  Administered 2016-02-27 – 2016-02-28 (×3): 2 g via INTRAVENOUS
  Filled 2016-02-27 (×3): qty 50

## 2016-02-27 MED ORDER — PHENYLEPHRINE HCL 10 MG/ML IJ SOLN
10.0000 mg | INTRAVENOUS | Status: DC | PRN
  Administered 2016-02-27: 25 ug/min via INTRAVENOUS

## 2016-02-27 MED ORDER — ONDANSETRON HCL 4 MG/2ML IJ SOLN
INTRAMUSCULAR | Status: DC | PRN
  Administered 2016-02-27: 4 mg via INTRAVENOUS

## 2016-02-27 MED ORDER — VITAMIN D 1000 UNITS PO TABS
1000.0000 [IU] | ORAL_TABLET | Freq: Every day | ORAL | Status: DC
  Administered 2016-02-28 – 2016-03-01 (×3): 1000 [IU] via ORAL
  Filled 2016-02-27 (×3): qty 1

## 2016-02-27 MED ORDER — ROCURONIUM BROMIDE 50 MG/5ML IV SOLN
INTRAVENOUS | Status: AC
  Filled 2016-02-27: qty 1

## 2016-02-27 MED ORDER — MORPHINE SULFATE (PF) 2 MG/ML IV SOLN: 2.0000 mg | INTRAVENOUS | Status: DC | PRN

## 2016-02-27 MED ORDER — ACETAMINOPHEN 10 MG/ML IV SOLN
INTRAVENOUS | Status: AC
  Administered 2016-02-27: 1 mg via INTRAVENOUS
  Filled 2016-02-27: qty 100

## 2016-02-27 MED ORDER — SCOPOLAMINE 1 MG/3DAYS TD PT72
MEDICATED_PATCH | TRANSDERMAL | Status: AC
  Administered 2016-02-27: 1 via TRANSDERMAL
  Filled 2016-02-27: qty 1

## 2016-02-27 MED ORDER — DEXAMETHASONE SODIUM PHOSPHATE 4 MG/ML IJ SOLN
INTRAMUSCULAR | Status: DC | PRN
  Administered 2016-02-27: 4 mg via INTRAVENOUS

## 2016-02-27 MED ORDER — PROPOFOL 10 MG/ML IV BOLUS
INTRAVENOUS | Status: AC
  Filled 2016-02-27: qty 40

## 2016-02-27 MED ORDER — SUCCINYLCHOLINE CHLORIDE 20 MG/ML IJ SOLN
INTRAMUSCULAR | Status: AC
  Filled 2016-02-27: qty 1

## 2016-02-27 MED ORDER — SODIUM CHLORIDE 0.9 % IJ SOLN
INTRAMUSCULAR | Status: AC
  Filled 2016-02-27: qty 10

## 2016-02-27 MED ORDER — LEVOTHYROXINE SODIUM 125 MCG PO TABS: 125.0000 ug | ORAL_TABLET | Freq: Every day | ORAL | Status: DC

## 2016-02-27 MED ORDER — ONDANSETRON HCL 4 MG/2ML IJ SOLN: 4.0000 mg | Freq: Four times a day (QID) | INTRAMUSCULAR | Status: DC | PRN

## 2016-02-27 MED ORDER — CHLORHEXIDINE GLUCONATE 4 % EX LIQD: 60.0000 mL | Freq: Once | CUTANEOUS | Status: DC

## 2016-02-27 MED ORDER — ONDANSETRON HCL 4 MG/2ML IJ SOLN
INTRAMUSCULAR | Status: AC
  Filled 2016-02-27: qty 2

## 2016-02-27 MED ORDER — LEVOTHYROXINE SODIUM 75 MCG PO TABS
187.5000 ug | ORAL_TABLET | ORAL | Status: DC
  Administered 2016-02-28: 187.5 ug via ORAL
  Filled 2016-02-27 (×2): qty 3

## 2016-02-27 MED ORDER — LEVOTHYROXINE SODIUM 25 MCG PO TABS
125.0000 ug | ORAL_TABLET | ORAL | Status: DC
  Administered 2016-02-29 – 2016-03-01 (×2): 125 ug via ORAL
  Filled 2016-02-27 (×2): qty 1

## 2016-02-27 MED ORDER — ROCURONIUM BROMIDE 100 MG/10ML IV SOLN
INTRAVENOUS | Status: DC | PRN
  Administered 2016-02-27: 40 mg via INTRAVENOUS

## 2016-02-27 MED ORDER — MIDAZOLAM HCL 2 MG/2ML IJ SOLN
0.5000 mg | Freq: Once | INTRAMUSCULAR | Status: AC
  Administered 2016-02-27: 0.5 mg via INTRAVENOUS

## 2016-02-27 MED ORDER — FENTANYL CITRATE (PF) 100 MCG/2ML IJ SOLN: 25.0000 ug | INTRAMUSCULAR | Status: DC | PRN

## 2016-02-27 MED ORDER — PROPOFOL 10 MG/ML IV BOLUS
INTRAVENOUS | Status: DC | PRN
  Administered 2016-02-27: 130 mg via INTRAVENOUS

## 2016-02-27 MED ORDER — VITAMIN D3 25 MCG (1000 UT) PO CAPS: 1000.0000 [IU] | ORAL_CAPSULE | Freq: Every day | ORAL | Status: DC

## 2016-02-27 MED ORDER — SUGAMMADEX SODIUM 200 MG/2ML IV SOLN
INTRAVENOUS | Status: AC
  Filled 2016-02-27: qty 2

## 2016-02-27 MED ORDER — DOCUSATE SODIUM 100 MG PO CAPS
100.0000 mg | ORAL_CAPSULE | Freq: Every day | ORAL | Status: DC | PRN
Start: 2016-02-27 — End: 2016-03-01

## 2016-02-27 MED ORDER — EPHEDRINE SULFATE 50 MG/ML IJ SOLN
INTRAMUSCULAR | Status: AC
  Filled 2016-02-27: qty 1

## 2016-02-27 MED ORDER — PRAVASTATIN SODIUM 20 MG PO TABS
20.0000 mg | ORAL_TABLET | Freq: Every day | ORAL | Status: DC
  Administered 2016-02-28 – 2016-02-29 (×2): 20 mg via ORAL
  Filled 2016-02-27 (×2): qty 1

## 2016-02-27 MED ORDER — SODIUM CHLORIDE 0.9 % IR SOLN
Status: DC | PRN
  Administered 2016-02-27: 1000 mL

## 2016-02-27 MED ORDER — HYDROCODONE-ACETAMINOPHEN 5-325 MG PO TABS
1.0000 | ORAL_TABLET | ORAL | Status: DC | PRN
  Administered 2016-02-27 – 2016-02-29 (×9): 1 via ORAL
  Administered 2016-03-01: 2 via ORAL
  Administered 2016-03-01: 1 via ORAL
  Filled 2016-02-27 (×9): qty 1
  Filled 2016-02-27: qty 2
  Filled 2016-02-27 (×2): qty 1

## 2016-02-27 MED ORDER — FENTANYL CITRATE (PF) 250 MCG/5ML IJ SOLN
INTRAMUSCULAR | Status: AC
  Filled 2016-02-27: qty 5

## 2016-02-27 MED ORDER — LORATADINE 10 MG PO TABS
10.0000 mg | ORAL_TABLET | Freq: Every day | ORAL | Status: DC
  Administered 2016-02-28 – 2016-03-01 (×3): 10 mg via ORAL
  Filled 2016-02-27 (×3): qty 1

## 2016-02-27 MED ORDER — PHENYLEPHRINE HCL 10 MG/ML IJ SOLN
INTRAMUSCULAR | Status: AC
  Filled 2016-02-27: qty 1

## 2016-02-27 MED ORDER — ASPIRIN 325 MG PO TABS
325.0000 mg | ORAL_TABLET | Freq: Every day | ORAL | Status: DC
  Administered 2016-02-27 – 2016-03-01 (×4): 325 mg via ORAL
  Filled 2016-02-27 (×4): qty 1

## 2016-02-27 MED ORDER — PHENYLEPHRINE HCL 10 MG/ML IJ SOLN
INTRAMUSCULAR | Status: DC | PRN
  Administered 2016-02-27: 120 ug via INTRAVENOUS
  Administered 2016-02-27: 80 ug via INTRAVENOUS
  Administered 2016-02-27: 40 ug via INTRAVENOUS

## 2016-02-27 MED ORDER — DEXTROSE-NACL 5-0.45 % IV SOLN
INTRAVENOUS | Status: AC
  Administered 2016-02-27: 22:00:00 via INTRAVENOUS

## 2016-02-27 MED ORDER — DIMENHYDRINATE 50 MG PO TABS
50.0000 mg | ORAL_TABLET | Freq: Three times a day (TID) | ORAL | Status: DC | PRN
  Administered 2016-02-28 – 2016-03-01 (×2): 50 mg via ORAL
  Filled 2016-02-27 (×5): qty 1

## 2016-02-27 MED ORDER — ONDANSETRON HCL 4 MG/2ML IJ SOLN: 4.0000 mg | Freq: Once | INTRAMUSCULAR | Status: DC | PRN

## 2016-02-27 MED ORDER — FENTANYL CITRATE (PF) 100 MCG/2ML IJ SOLN
50.0000 ug | Freq: Once | INTRAMUSCULAR | Status: AC
  Administered 2016-02-27: 50 ug via INTRAVENOUS

## 2016-02-27 MED ORDER — EPHEDRINE SULFATE 50 MG/ML IJ SOLN
INTRAMUSCULAR | Status: DC | PRN
  Administered 2016-02-27: 10 mg via INTRAVENOUS

## 2016-02-27 MED ORDER — LIDOCAINE HCL (CARDIAC) 20 MG/ML IV SOLN
INTRAVENOUS | Status: AC
  Filled 2016-02-27: qty 5

## 2016-02-27 MED ORDER — FENTANYL CITRATE (PF) 100 MCG/2ML IJ SOLN
INTRAMUSCULAR | Status: DC | PRN
  Administered 2016-02-27: 50 ug via INTRAVENOUS

## 2016-02-27 MED ORDER — PHENYLEPHRINE 40 MCG/ML (10ML) SYRINGE FOR IV PUSH (FOR BLOOD PRESSURE SUPPORT)
PREFILLED_SYRINGE | INTRAVENOUS | Status: AC
  Filled 2016-02-27: qty 10

## 2016-02-27 MED ORDER — FENTANYL CITRATE (PF) 100 MCG/2ML IJ SOLN
INTRAMUSCULAR | Status: AC
  Administered 2016-02-27: 50 ug via INTRAVENOUS
  Filled 2016-02-27: qty 2

## 2016-02-27 MED ORDER — ONDANSETRON HCL 4 MG PO TABS: 4.0000 mg | ORAL_TABLET | Freq: Four times a day (QID) | ORAL | Status: DC | PRN

## 2016-02-27 MED ORDER — AZO BLADDER CONTROL/GO-LESS PO CAPS: ORAL_CAPSULE | Freq: Every day | ORAL | Status: DC

## 2016-02-27 MED ORDER — PANTOPRAZOLE SODIUM 40 MG PO TBEC
40.0000 mg | DELAYED_RELEASE_TABLET | Freq: Every day | ORAL | Status: DC
  Administered 2016-02-28 – 2016-03-01 (×3): 40 mg via ORAL
  Filled 2016-02-27 (×3): qty 1

## 2016-02-27 MED ORDER — MIDAZOLAM HCL 2 MG/2ML IJ SOLN
INTRAMUSCULAR | Status: AC
  Administered 2016-02-27: 0.5 mg via INTRAVENOUS
  Filled 2016-02-27: qty 2

## 2016-02-27 MED ORDER — CEFAZOLIN SODIUM-DEXTROSE 2-3 GM-% IV SOLR
INTRAVENOUS | Status: AC
  Filled 2016-02-27: qty 50

## 2016-02-27 MED ORDER — FLUOXETINE HCL 10 MG PO CAPS
10.0000 mg | ORAL_CAPSULE | Freq: Every day | ORAL | Status: DC
  Administered 2016-02-27 – 2016-03-01 (×4): 10 mg via ORAL
  Filled 2016-02-27 (×7): qty 1

## 2016-02-27 SURGICAL SUPPLY — 61 items
BIT DRILL TWIST 2.7 (BIT) IMPLANT
BLADE SAW SAG 73X25 THK (BLADE) ×1
BLADE SAW SGTL 73X25 THK (BLADE) ×1 IMPLANT
BONE CEMENT PALACOSE (Orthopedic Implant) ×2 IMPLANT
CAPT SHLDR REVTOTAL 2 ×1 IMPLANT
CEMENT BONE PALACOSE (Orthopedic Implant) IMPLANT
CLSR STERI-STRIP ANTIMIC 1/2X4 (GAUZE/BANDAGES/DRESSINGS) ×1 IMPLANT
COVER SURGICAL LIGHT HANDLE (MISCELLANEOUS) ×2 IMPLANT
DRAPE IMP U-DRAPE 54X76 (DRAPES) ×5 IMPLANT
DRAPE INCISE IOBAN 66X45 STRL (DRAPES) ×2 IMPLANT
DRAPE U-SHAPE 47X51 STRL (DRAPES) ×2 IMPLANT
DRILL BIT 5/64 (BIT) IMPLANT
DRSG ADAPTIC 3X8 NADH LF (GAUZE/BANDAGES/DRESSINGS) ×1 IMPLANT
DRSG MEPILEX BORDER 4X8 (GAUZE/BANDAGES/DRESSINGS) ×1 IMPLANT
DRSG PAD ABDOMINAL 8X10 ST (GAUZE/BANDAGES/DRESSINGS) IMPLANT
DURAPREP 26ML APPLICATOR (WOUND CARE) ×2 IMPLANT
ELECT REM PT RETURN 9FT ADLT (ELECTROSURGICAL) ×2
ELECTRODE REM PT RTRN 9FT ADLT (ELECTROSURGICAL) ×1 IMPLANT
GAUZE SPONGE 4X4 12PLY STRL (GAUZE/BANDAGES/DRESSINGS) ×1 IMPLANT
GLOVE BIO SURGEON STRL SZ7 (GLOVE) ×2 IMPLANT
GLOVE BIO SURGEON STRL SZ7.5 (GLOVE) ×4 IMPLANT
GLOVE BIOGEL PI IND STRL 7.0 (GLOVE) ×1 IMPLANT
GLOVE BIOGEL PI IND STRL 8 (GLOVE) ×2 IMPLANT
GLOVE BIOGEL PI INDICATOR 7.0 (GLOVE) ×1
GLOVE BIOGEL PI INDICATOR 8 (GLOVE) ×3
GLOVE BIOGEL PI ORTHO PRO SZ8 (GLOVE) ×1
GLOVE PI ORTHO PRO STRL SZ8 (GLOVE) ×1 IMPLANT
GLOVE SURG ORTHO 8.0 STRL STRW (GLOVE) ×2 IMPLANT
GOWN STRL REUS W/ TWL LRG LVL3 (GOWN DISPOSABLE) ×1 IMPLANT
GOWN STRL REUS W/TWL 2XL LVL3 (GOWN DISPOSABLE) ×2 IMPLANT
GOWN STRL REUS W/TWL LRG LVL3 (GOWN DISPOSABLE) ×2
KIT BASIN OR (CUSTOM PROCEDURE TRAY) ×2 IMPLANT
KIT ROOM TURNOVER OR (KITS) ×2 IMPLANT
MANIFOLD NEPTUNE II (INSTRUMENTS) ×2 IMPLANT
NDL HYPO 25GX1X1/2 BEV (NEEDLE) ×1 IMPLANT
NDL SUT .5 MAYO 1.404X.05X (NEEDLE) IMPLANT
NDL SUT 2 .5 CRC MAYO 1.732X (NEEDLE) IMPLANT
NEEDLE HYPO 25GX1X1/2 BEV (NEEDLE) ×2 IMPLANT
NEEDLE MAYO TAPER (NEEDLE) ×2
NS IRRIG 1000ML POUR BTL (IV SOLUTION) ×2 IMPLANT
PACK SHOULDER (CUSTOM PROCEDURE TRAY) ×2 IMPLANT
PACK UNIVERSAL I (CUSTOM PROCEDURE TRAY) ×1 IMPLANT
PAD ARMBOARD 7.5X6 YLW CONV (MISCELLANEOUS) ×4 IMPLANT
PIN THREADED REVERSE (PIN) IMPLANT
SLING ARM IMMOBILIZER LRG (SOFTGOODS) ×1 IMPLANT
SLING ARM IMMOBILIZER MED (SOFTGOODS) IMPLANT
SPONGE LAP 18X18 X RAY DECT (DISPOSABLE) ×1 IMPLANT
STRIP CLOSURE SKIN 1/2X4 (GAUZE/BANDAGES/DRESSINGS) ×1 IMPLANT
SUCTION FRAZIER HANDLE 10FR (MISCELLANEOUS) ×1
SUCTION TUBE FRAZIER 10FR DISP (MISCELLANEOUS) ×1 IMPLANT
SUT FIBERWIRE #2 38 T-5 BLUE (SUTURE) ×8
SUT MNCRL AB 4-0 PS2 18 (SUTURE) ×2 IMPLANT
SUT MON AB 2-0 CT1 36 (SUTURE) ×2 IMPLANT
SUT VIC AB 0 CT1 27 (SUTURE) ×2
SUT VIC AB 0 CT1 27XBRD ANBCTR (SUTURE) ×1 IMPLANT
SUTURE FIBERWR #2 38 T-5 BLUE (SUTURE) ×4 IMPLANT
TOWEL OR 17X24 6PK STRL BLUE (TOWEL DISPOSABLE) ×2 IMPLANT
TOWEL OR 17X26 10 PK STRL BLUE (TOWEL DISPOSABLE) ×2 IMPLANT
TOWER CARTRIDGE SMART MIX (DISPOSABLE) ×1 IMPLANT
TRAY FOLEY CATH 14FR (SET/KITS/TRAYS/PACK) IMPLANT
WATER STERILE IRR 1000ML POUR (IV SOLUTION) ×1 IMPLANT

## 2016-02-27 NOTE — Progress Notes (Signed)
Care of pt assumed by MA Samarra Ridgely RN 

## 2016-02-27 NOTE — Progress Notes (Signed)
Report to L. Reynolds Therapist, sports as primary caregiver

## 2016-02-27 NOTE — Anesthesia Preprocedure Evaluation (Addendum)
Anesthesia Evaluation  Patient identified by MRN, date of birth, ID band Patient awake    Reviewed: Patient's Chart, lab work & pertinent test results, reviewed documented beta blocker date and time   History of Anesthesia Complications (+) PONV, Family history of anesthesia reaction and history of anesthetic complications  Airway Mallampati: III   Neck ROM: full  Mouth opening: Limited Mouth Opening  Dental  (+) Teeth Intact   Pulmonary    breath sounds clear to auscultation       Cardiovascular hypertension,  Rhythm:irregular     Neuro/Psych Anxiety    GI/Hepatic GERD  Medicated,  Endo/Other  Hypothyroidism   Renal/GU      Musculoskeletal  (+) Arthritis ,   Abdominal   Peds  Hematology   Anesthesia Other Findings   Reproductive/Obstetrics                        Anesthesia Physical Anesthesia Plan  ASA: III  Anesthesia Plan: General ETT and Regional   Post-op Pain Management: GA combined w/ Regional for post-op pain   Induction:   Airway Management Planned:   Additional Equipment:   Intra-op Plan:   Post-operative Plan:   Informed Consent: I have reviewed the patients History and Physical, chart, labs and discussed the procedure including the risks, benefits and alternatives for the proposed anesthesia with the patient or authorized representative who has indicated his/her understanding and acceptance.   Dental Advisory Given  Plan Discussed with: Anesthesiologist, CRNA and Surgeon  Anesthesia Plan Comments:         Anesthesia Quick Evaluation

## 2016-02-27 NOTE — Anesthesia Procedure Notes (Addendum)
Procedure Name: Intubation Date/Time: 02/27/2016 10:50 AM Performed by: Scheryl Darter Pre-anesthesia Checklist: Patient identified, Emergency Drugs available, Suction available, Patient being monitored and Timeout performed Patient Re-evaluated:Patient Re-evaluated prior to inductionOxygen Delivery Method: Circle system utilized Preoxygenation: Pre-oxygenation with 100% oxygen Intubation Type: IV induction Ventilation: Mask ventilation without difficulty and Oral airway inserted - appropriate to patient size Laryngoscope Size: Glidescope Grade View: Grade III Tube type: Oral Tube size: 7.5 mm Number of attempts: 1 Airway Equipment and Method: Bougie stylet Placement Confirmation: ETT inserted through vocal cords under direct vision,  positive ETCO2 and breath sounds checked- equal and bilateral Secured at: 22 cm Tube secured with: Tape Dental Injury: Teeth and Oropharynx as per pre-operative assessment  Difficulty Due To: Difficulty was anticipated, Difficult Airway- due to anterior larynx and Difficult Airway- due to dentition   Anesthesia Regional Block:  Interscalene brachial plexus block  Pre-Anesthetic Checklist: ,, timeout performed, Correct Patient, Correct Site, Correct Laterality, Correct Procedure, Correct Position, site marked, Risks and benefits discussed,  Surgical consent,  Pre-op evaluation,  At surgeon's request and post-op pain management  Laterality: Left  Prep: chloraprep       Needles:      Needle Length: 9cm 9 cm Needle Gauge: 21 and 21 G    Additional Needles:  Procedures: ultrasound guided (picture in chart) Interscalene brachial plexus block Narrative:  Start time: 02/27/2016 10:00 AM End time: 02/27/2016 10:10 AM Injection made incrementally with aspirations every 5 mL.  Performed by: Personally   Additional Notes: 30 cc 00.5% Marcaine 1:200 Epi injected easily

## 2016-02-27 NOTE — Op Note (Signed)
02/27/2016  12:53 PM  PATIENT:  Tasha Moss    PRE-OPERATIVE DIAGNOSIS:  LEFT PROXIMAL HUMERUS FRACTURE  POST-OPERATIVE DIAGNOSIS:  Same  PROCEDURE:  LEFT REVERSE TOTAL SHOULDER ARTHROPLASTY  SURGEON:  Shiron Whetsel, Ernesta Amble, MD  PHYSICIAN ASSISTANT: Joya Gaskins, OPA-C, present and scrubbed throughout the case, critical for completion in a timely fashion, and for retraction, instrumentation, and closure.   ANESTHESIA:   General  PREOPERATIVE INDICATIONS:  TASHIANNA Moss is a  80 y.o. female with a diagnosis of LEFT PROXIMAL HUMERUS FRACTURE who failed conservative measures and elected for surgical management.    The risks benefits and alternatives were discussed with the patient preoperatively including but not limited to the risks of infection, bleeding, nerve injury, cardiopulmonary complications, the need for revision surgery, dislocation, brachial plexus palsy, incomplete relief of pain, among others, and the patient was willing to proceed.  OPERATIVE IMPLANTS: Biomet size 6 humeral stem cemented fracture stem with a 36 mm reverse shoulder arthroplasty tray with a standard liner and a standard mm glenosphere with a standard baseplate and 3 locking screws and one central nonlocking screw.  OPERATIVE FINDINGS: Severe comminution of the humeral head fracture of the posterior glenoid  OPERATIVE PROCEDURE: The patient was brought to the operating room and placed in the supine position. General anesthesia was administered. IV antibiotics were given. A Foley was placed. Time out was performed. The upper extremity was prepped and draped in usual sterile fashion. The patient was in a beachchair position. Deltopectoral approach was carried out. The biceps was tenodesed to the pectoralis tendon with #2 Fiberwire.   Identified the fracture site and removed the humeral head it was split in the multiple pieces. The graft I placed a #2 FiberWire stitch in the subscapularis as well as in the supra  and infraspinatus tendons to control these pieces I did at the shell out the greater small amounts that it would close down over top of the arthroplasty.  Deep retractors were placed, and I resected the labrum, and then placed a guidepin into the center position on the glenoid, with slight inferior inclination. I then reamed over the guidepin, and this created a small metaphyseal cancellus blush inferiorly, removing just the cartilage to the subchondral bone superiorly. At this point I did note that her bone was very soft and there was a small loose piece of the posterior glenoid. We had solid glenoid anteriorly and superiorly and posts and inferiorly. I elected to continue going forward with placement of the baseplate. The base plate was selected and impacted place, and then I secured it centrally with a nonlocking screw, and I had excellent purchase both inferiorly and superiorly. I placed a short locking screws on anterior  superior and inferior. I placed a retractor posteriorly to secure the loose piece and then placed a nonlocking screw in the posterior superior screw hole into this.  I then turned my attention to the glenosphere, and impacted this into place, placing slight superior offset (set on B) to account for the shortening over the last few weeks..   The glenoid sphere was completely seated, and had engagement of the [facility] taper. I then turned my attention back to the humerus.  I sequentially broached the humerus due to the lack of bone I elected to cement and a size 6 stem. I was able to get a height key off of the calcar.  I then impacted the humeral tray, and reduced the shoulder. The shoulder had excellent motion, and was  stable, and I irrigated the wounds copiously.   Before I placed the real prosthesis I had also placed a total of 3 #2 FiberWire through drill holes in the humerus. I then used these to repair the subscapularis. This came down to bone.  I then irrigated the shoulder  copiously once more, repaired the deltopectoral interval with Vicryl followed by subcutaneous Vicryl with Steri-Strips and sterile gauze for the skin. The patient was awakened and returned back in stable and satisfactory condition. There no complications and She tolerated the procedure well.  POST OP PLAN: sling full time for 6wks  This note was generated using a template and dragon dictation system. In light of that, I have reviewed the note and all aspects of it are applicable to this case. Any dictation errors are due to the computerized dictation system.

## 2016-02-27 NOTE — Transfer of Care (Signed)
Immediate Anesthesia Transfer of Care Note  Patient: Tasha Moss  Procedure(s) Performed: Procedure(s): LEFT REVERSE TOTAL SHOULDER ARTHROPLASTY (Left)  Patient Location: PACU  Anesthesia Type:General and Regional  Level of Consciousness: awake, alert , oriented and sedated  Airway & Oxygen Therapy: Patient Spontanous Breathing and Patient connected to face mask oxygen  Post-op Assessment: Report given to RN, Post -op Vital signs reviewed and stable and Patient moving all extremities  Post vital signs: Reviewed and stable  Last Vitals:  Filed Vitals:   02/27/16 1015 02/27/16 1016  BP:    Pulse: 80 89  Temp:    Resp: 15 17    Complications: No apparent anesthesia complications

## 2016-02-27 NOTE — Anesthesia Postprocedure Evaluation (Signed)
Anesthesia Post Note  Patient: Tasha Moss  Procedure(s) Performed: Procedure(s) (LRB): LEFT REVERSE TOTAL SHOULDER ARTHROPLASTY (Left)  Patient location during evaluation: PACU Anesthesia Type: General and Regional Level of consciousness: awake, awake and alert and oriented Pain management: pain level controlled Vital Signs Assessment: post-procedure vital signs reviewed and stable Respiratory status: spontaneous breathing, nonlabored ventilation and respiratory function stable Cardiovascular status: blood pressure returned to baseline Anesthetic complications: no    Last Vitals:  Filed Vitals:   02/27/16 1740 02/27/16 1756  BP: 125/64 120/58  Pulse: 84 79  Temp: 36.7 C 37 C  Resp: 15 16    Last Pain:  Filed Vitals:   02/27/16 1813  PainSc: 0-No pain                 Perrie Ragin COKER

## 2016-02-27 NOTE — H&P (Signed)
  [facility] [address] [place], [place] [address] [phone] A Division of [facility]  Ninetta Lights, M.D.   Robert A. Noemi Chapel, M.D.   Faythe Casa, M.D.   Johnny Bridge, M.D.   Almedia Balls, M.D Ernesta Amble. Percell Miller, M.D.  Joseph Pierini, M.D.  Lanier Prude, M.D.    Verner Chol, M.D. Lovett Calender, PA- C  Mary L. Venida Jarvis, PA-C  Kirstin A. Shepperson, PA-C  Josh [place], PA-C  [place], [place]                                                                    RE: [place], [place]                                    [id number]      DOB: 1936/12/09 PROGRESS NOTE: 02-19-16 Reason for visit: Follow up left proximal humerus fracture.  Date of injury: February 10, 2016.   History of present illness: We have been working towards getting clearance.  We also got a CAT scan of the left shoulder which demonstrates severe comminution of her articular surface of her humeral head.  They are very interested in undergoing surgery for this.  Of note, she has significant weakness of her lower extremities.  This has limited her mobility quite a bit.  Because she needed both her arms to push up from a chair.   Please see associated documentation for this clinic visit for further past medical, family, surgical and social history, review of systems, and exam findings as this was reviewed by me.  EXAMINATION: Well appearing female in no apparent distress. The left upper extremity is neurovascularly intact.  There is still some mild swelling and tenderness around her shoulder.    X-RAYS: I reviewed her CAT scan which again demonstrates severe comminution of her articular surface.    ASSESSMENT/PLAN: As I stated, she has two options.  We can continue with non-operative management.  Given the severe comminution and displacement motion would likely be very limited and use of the arm would be very limited.  Alternatively I  would recommend a reverse total shoulder arthroplasty.  She would like to go forward with that.  I discussed the risks and benefits of that with her and her family.  She may end up needing AR versus SNF post-op, so we will plan on keeping her in the hospital postoperatively.  We did discuss possibly pre-admitting her due to her difficulty with mobilizing at home, but they are working well with home health physical therapy and she is living with her daughters right now.    Ernesta Amble.  Percell Miller, M.D.  Electronically verified by Ernesta Amble. Percell Miller, M.D. TDM:jjh D 02-20-16 T 02-21-16

## 2016-02-28 ENCOUNTER — Encounter (HOSPITAL_COMMUNITY): Payer: Self-pay | Admitting: Orthopedic Surgery

## 2016-02-28 NOTE — Evaluation (Signed)
Physical Therapy Evaluation Patient Details Name: Tasha Moss MRN: [id number] DOB: [birth year] Today's Date: 02/28/2016   History of Present Illness  Pt is a 80 y.o. female who describes falling on 02/10/16 and sustaining a proximal humerus fracture. She underwent a  reverse total shoulder arthroplasty on 02/27/16.   Clinical Impression  Patient is noted to have significant limitations at this time regarding her mobility. She was able to ambulate 70 feet but she required min assistance and significant instability was noted. The patient also is requiring moderate assist for sit/stand transfers from chair height. Overall the patient is requiring significant assistance with her mobility and she may benefit from short stay at SNF for further rehabilitation prior to returning home. PT to continue to follow and progress mobility as tolerated.     Follow Up Recommendations SNF;Supervision for mobility/OOB    Equipment Recommendations  None recommended by PT    Recommendations for Other Services       Precautions / Restrictions Precautions Precautions: Shoulder Required Braces or Orthoses: Sling Restrictions Weight Bearing Restrictions: Yes LUE Weight Bearing: Non weight bearing      Mobility  Bed Mobility               General bed mobility comments: up in chair upon arrival  Transfers Overall transfer level: Needs assistance Equipment used: None Transfers: Sit to/from Stand Sit to Stand: Mod assist         General transfer comment: Heavy encouragement needed. Min assist to move hips forward in chair.   Ambulation/Gait Ambulation/Gait assistance: Min assist Ambulation Distance (Feet): 70 Feet Assistive device: 1 person hand held assist Gait Pattern/deviations: Step-through pattern;Decreased step length - right;Decreased step length - left Gait velocity: decreased   General Gait Details: Unsteady pattern but no gross loss of balance. Pt anxious about falling and repeated  encouagement needed. Pt maintaining a narrow BOS.     Stairs            Wheelchair Mobility    Modified Rankin (Stroke Patients Only)       Balance Overall balance assessment: Needs assistance Sitting-balance support: No upper extremity supported Sitting balance-Leahy Scale: Good     Standing balance support: Single extremity supported Standing balance-Leahy Scale: Poor                               Pertinent Vitals/Pain Pain Assessment: 0-10 Pain Score: 5  Pain Location: Lt shoulder Pain Descriptors / Indicators: Aching Pain Intervention(s): Limited activity within patient's tolerance;Monitored during session;Repositioned    Home Living Family/patient expects to be discharged to:: Skilled nursing facility Living Arrangements: Spouse/significant other Available Help at Discharge: Family;Available 24 hours/day Type of Home: House Home Access: Stairs to enter;Ramped entrance Entrance Stairs-Rails: Right;Left Entrance Stairs-Number of Steps: 2 Home Layout: One level Home Equipment: Walker - 2 wheels;Wheelchair - manual;Cane - single point;Cane - quad Additional Comments: Pt reports that they have been using a portable ramp for the stairs into her home. She states that her husband has been pushing up/down the ramp in a w/c.     Prior Function Level of Independence: Needs assistance   Gait / Transfers Assistance Needed: reports that her husband was assisting her to stand.            Hand Dominance        Extremity/Trunk Assessment   Upper Extremity Assessment: Defer to OT evaluation  Lower Extremity Assessment: Generalized weakness         Communication   Communication: No difficulties  Cognition Arousal/Alertness: Awake/alert Behavior During Therapy: Anxious Overall Cognitive Status: Within Functional Limits for tasks assessed                      General Comments      Exercises        Assessment/Plan     PT Assessment Patient needs continued PT services  PT Diagnosis Difficulty walking;Generalized weakness   PT Problem List Decreased strength;Decreased activity tolerance;Decreased balance;Decreased mobility;Decreased knowledge of use of DME  PT Treatment Interventions DME instruction;Gait training;Functional mobility training;Stair training;Therapeutic activities;Therapeutic exercise;Balance training;Patient/family education   PT Goals (Current goals can be found in the Care Plan section) Acute Rehab PT Goals Patient Stated Goal: get more therapy before going home PT Goal Formulation: With patient Time For Goal Achievement: 03/13/16 Potential to Achieve Goals: Good    Frequency Min 3X/week   Barriers to discharge        Co-evaluation               End of Session Equipment Utilized During Treatment: Gait belt Activity Tolerance: Patient tolerated treatment well Patient left: in chair;with family/visitor present;with call bell/phone within reach;Other (comment) (Lt UE supported) Nurse Communication: Mobility status         Time: [id number] PT Time Calculation (min) (ACUTE ONLY): 32 min   Charges:   PT Evaluation $PT Eval Moderate Complexity: 1 Procedure PT Treatments $Gait Training: 8-22 mins   PT G Codes:        Cassell Clement, PT, CSCS Pager [phone] Office [phone]  02/28/2016, 10:41 AM

## 2016-02-28 NOTE — Progress Notes (Signed)
She is doing well Pain constrolled  NVI Dressings C/D/I  Continue PT to focus on Gait and LE strength NO ROM for LUE   Tasha Moss D

## 2016-02-28 NOTE — Evaluation (Signed)
Occupational Therapy Evaluation Patient Details Name: Tasha Moss MRN: [id number] DOB: 1936/12/05 Today's Date: 02/28/2016    History of Present Illness Pt is a 80 y.o. female who describes falling on 02/10/16 and sustaining a proximal humerus fracture. She underwent a  reverse total shoulder arthroplasty on 02/27/16.    Clinical Impression   This 80 yo female admitted and underwent above presents to acute OT with deficits below. She will benefit from acute OT with follow up OT at SNF to get to a more independent level before D/C'ing home with husband to A her.    Follow Up Recommendations  SNF    Equipment Recommendations   (TBD at next venue)       Precautions / Restrictions Precautions Precautions: Shoulder;Fall Shoulder Interventions: Shoulder sling/immobilizer;At all times Precaution Comments: NO ROM OF LUE Required Braces or Orthoses: Sling Restrictions Weight Bearing Restrictions: Yes LUE Weight Bearing: Non weight bearing      Mobility Bed Mobility               General bed mobility comments: up in chair upon arrival  Transfers Overall transfer level: Needs assistance Equipment used: 1 person hand held assist Transfers: Sit to/from Stand Sit to Stand: Mod assist            Balance Overall balance assessment: History of Falls;Needs assistance Sitting-balance support: Single extremity supported;Feet supported Sitting balance-Leahy Scale: Fair     Standing balance support: Single extremity supported Standing balance-Leahy Scale: Poor Standing balance comment: Reliant on 1 hand A when up on feet                            ADL Overall ADL's : Needs assistance/impaired Eating/Feeding: Set up;Sitting   Grooming: Moderate assistance;Sitting   Upper Body Bathing: Total assistance;Sitting   Lower Body Bathing: Total assistance (Mod A sit<>stand)   Upper Body Dressing : Total assistance;Sitting   Lower Body Dressing: Total assistance  (Mod A sit<>stand)   Toilet Transfer: Moderate assistance;Ambulation (one hand A sit<>stand (recliner>window>door>window>door>recliner)   Toileting- Clothing Manipulation and Hygiene: Total assistance (Mod A sit<>stand)                         Pertinent Vitals/Pain Pain Assessment: Faces Pain Score: 5  Faces Pain Scale: Hurts whole lot Pain Location: Left shoulder Pain Descriptors / Indicators: Aching;Sore;Grimacing;Guarding Pain Intervention(s): Monitored during session;Repositioned;Premedicated before session;Relaxation (pt medicated at 10:31 am)     Hand Dominance  right   Extremity/Trunk Assessment Upper Extremity Assessment Upper Extremity Assessment: LUE deficits/detail LUE Deficits / Details: NO ROM per MD note LUE Coordination: decreased fine motor;decreased gross motor   Lower Extremity Assessment Lower Extremity Assessment: Generalized weakness       Communication Communication Communication: No difficulties   Cognition Arousal/Alertness: Awake/alert Behavior During Therapy: Anxious (due to pain) Overall Cognitive Status: Within Functional Limits for tasks assessed                                Home Living Family/patient expects to be discharged to:: Skilled nursing facility Living Arrangements: Spouse/significant other Available Help at Discharge: Family;Available 24 hours/day Type of Home: House Home Access: Stairs to enter;Ramped entrance Entrance Stairs-Number of Steps: 2 Entrance Stairs-Rails: Right;Left Home Layout: One level               Home Equipment: Walker - 2 wheels;Wheelchair -  manual;Cane - single point;Cane - quad   Additional Comments: Pt reports that they have been using a portable ramp for the stairs into her home. She states that her husband has been pushing up/down the ramp in a w/c.       Prior Functioning/Environment Level of Independence: Needs assistance  Gait / Transfers Assistance Needed: reports  that her husband was assisting her to stand.           OT Diagnosis: Generalized weakness;Acute pain   OT Problem List: Decreased strength;Decreased range of motion;Impaired balance (sitting and/or standing);Pain;Impaired UE functional use;Obesity;Decreased knowledge of use of DME or AE   OT Treatment/Interventions: Self-care/ADL training;Patient/family education;Balance training;DME and/or AE instruction;Therapeutic activities    OT Goals(Current goals can be found in the care plan section) Acute Rehab OT Goals Patient Stated Goal: to rehab then home OT Goal Formulation: With patient/family Time For Goal Achievement: 03/06/16 Potential to Achieve Goals: Good  OT Frequency: Min 2X/week              End of Session Equipment Utilized During Treatment: Gait belt (did try one handed RW (while this would not be the optimal, this is what she was doing at home pta)) Nurse Communication:  (pt in pain, can we check on pain meds; pt also asking about her anti-dizziness meds)  Activity Tolerance: Patient limited by pain Patient left: in chair;with call bell/phone within reach   Time: 1224-1305 OT Time Calculation (min): 41 min Charges:  OT General Charges $OT Visit: 1 Procedure OT Evaluation $OT Eval Moderate Complexity: 1 Procedure OT Treatments $Self Care/Home Management : 23-37 mins  Almon Register [id number] 02/28/2016, 1:22 PM

## 2016-02-29 MED ORDER — HYDROCODONE-ACETAMINOPHEN 5-325 MG PO TABS: 1.0000 | ORAL_TABLET | ORAL | Status: DC | PRN

## 2016-02-29 MED ORDER — DOCUSATE SODIUM 100 MG PO CAPS
100.0000 mg | ORAL_CAPSULE | Freq: Every day | ORAL | Status: DC
  Administered 2016-02-29 – 2016-03-01 (×2): 100 mg via ORAL
  Filled 2016-02-29 (×2): qty 1

## 2016-02-29 MED ORDER — DOCUSATE SODIUM 100 MG PO CAPS: 100.0000 mg | ORAL_CAPSULE | Freq: Two times a day (BID) | ORAL | Status: DC

## 2016-02-29 MED ORDER — ASPIRIN EC 325 MG PO TBEC: 325.0000 mg | DELAYED_RELEASE_TABLET | Freq: Every day | ORAL | Status: DC

## 2016-02-29 NOTE — Discharge Summary (Signed)
Physician Discharge Summary  Patient ID: Tasha Moss MRN: [id number] DOB/AGE: 1936/03/12 80 y.o.  Admit date: 02/27/2016 Discharge date: 02/29/2016  Admission Diagnoses:  <principal problem not specified>  Discharge Diagnoses:  Active Problems:   Proximal humeral fracture   Past Medical History  Diagnosis Date  . Allergy     seasonal & dust  . Anxiety   . Arthritis     DJD  . GERD (gastroesophageal reflux disease)   . Thyroid disease     hypothyroidism post Hashimoto's & partial thyroidectomy  . Hyperlipidemia   . Hypertension   . PONV (postoperative nausea and vomiting)   . History of vertigo   . Family history of adverse reaction to anesthesia     n/v   . Hypothyroidism     Surgeries: Procedure(s): LEFT REVERSE TOTAL SHOULDER ARTHROPLASTY on 02/27/2016   Consultants (if any):    Discharged Condition: Improved  Hospital Course: Tasha Moss is an 80 y.o. female who was admitted 02/27/2016 with a diagnosis of <principal problem not specified> and went to the operating room on 02/27/2016 and underwent the above named procedures.    She was given perioperative antibiotics:  Anti-infectives    Start     Dose/Rate Route Frequency Ordered Stop   02/27/16 1645  ceFAZolin (ANCEF) IVPB 2 g/50 mL premix     2 g 100 mL/hr over 30 Minutes Intravenous Every 6 hours 02/27/16 1643 02/28/16 0552   02/27/16 1640  ceFAZolin (ANCEF) 2-3 GM-% IVPB SOLR    Comments:  Reynolds, Lauren   : cabinet override      02/27/16 1640 02/27/16 1645   02/27/16 1000  ceFAZolin (ANCEF) IVPB 2 g/50 mL premix     2 g 100 mL/hr over 30 Minutes Intravenous To [facility] 02/26/16 0951 02/27/16 1050    .  She was given sequential compression devices, early ambulation, and ASA for DVT prophylaxis.  She benefited maximally from the hospital stay and there were no complications.    Recent vital signs:  Filed Vitals:   02/29/16 0540 02/29/16 1300  BP: 131/63 127/67  Pulse: 91 86  Temp:  98.7 F (37.1 C) 98.1 F (36.7 C)  Resp: 16 16    Recent laboratory studies:  Lab Results  Component Value Date   HGB 11.7* 02/19/2016   HGB 13.6 01/03/2016   HGB 13.2 07/03/2015   Lab Results  Component Value Date   WBC 8.4 02/19/2016   PLT 258 02/19/2016   Lab Results  Component Value Date   INR 1.11 02/19/2016   Lab Results  Component Value Date   NA 142 02/19/2016   K 3.4* 02/19/2016   CL 103 02/19/2016   CO2 26 02/19/2016   BUN 13 02/19/2016   CREATININE 0.65 02/19/2016   GLUCOSE 92 02/19/2016    Discharge Medications:     Medication List    TAKE these medications        aspirin EC 325 MG tablet  Take 1 tablet (325 mg total) by mouth daily.     AZO BLADDER CONTROL/GO-LESS PO  Take 1 tablet by mouth daily.     Cranberry 250 MG Caps  Take 250 mg by mouth daily.     dimenhyDRINATE 50 MG tablet  Commonly known as:  DRAMAMINE  Take 50 mg by mouth every 8 (eight) hours as needed for nausea.     docusate sodium 100 MG capsule  Commonly known as:  COLACE  Take 100 mg by mouth daily  as needed for mild constipation.     docusate sodium 100 MG capsule  Commonly known as:  COLACE  Take 1 capsule (100 mg total) by mouth 2 (two) times daily. Continue this while taking narcotics to help with bowel movements     FLUoxetine 10 MG capsule  Commonly known as:  PROZAC  TAKE ONE CAPSULE BY MOUTH ONCE DAILY AT BEDTIME     glucosamine-chondroitin 500-400 MG tablet  Take 1 tablet by mouth daily.     HYDROcodone-acetaminophen 5-325 MG tablet  Commonly known as:  NORCO  Take 1-2 tablets by mouth every 4 (four) hours as needed for moderate pain.     levothyroxine 125 MCG tablet  Commonly known as:  SYNTHROID, LEVOTHROID  Take 125-187.5 mcg by mouth daily before breakfast.     levothyroxine 125 MCG tablet  Commonly known as:  SYNTHROID, LEVOTHROID  TAKE ONE TABLET BY MOUTH ONCE DAILY BEFORE BREAKFAST     loratadine 10 MG tablet  Commonly known as:  CLARITIN   Take 10 mg by mouth daily.     meloxicam 7.5 MG tablet  Commonly known as:  MOBIC  TAKE ONE TABLET BY MOUTH TWICE DAILY     multivitamin tablet  Take 1 tablet by mouth daily.     omeprazole 20 MG capsule  Commonly known as:  PRILOSEC  TAKE ONE CAPSULE BY MOUTH ONCE DAILY IN THE MORNING 30 MINUTES BEFORE BREAKFAST     ondansetron 4 MG tablet  Commonly known as:  ZOFRAN  Take 1 tablet (4 mg total) by mouth every 6 (six) hours.     pravastatin 40 MG tablet  Commonly known as:  PRAVACHOL  TAKE ONE TABLET BY MOUTH AT BEDTIME FOR CHOLESTEROL     rOPINIRole 0.25 MG tablet  Commonly known as:  REQUIP  1 qhs     vitamin B-12 250 MCG tablet  Commonly known as:  CYANOCOBALAMIN  Take 250 mcg by mouth daily.     Vitamin D3 1000 units Caps  Take 1,000 Units by mouth daily.        Diagnostic Studies: Ct Shoulder Left Wo Contrast  02/16/2016  CLINICAL DATA:  Left shoulder pain, evaluate humeral head fracture, preop evaluation EXAM: CT OF THE LEFT SHOULDER WITHOUT CONTRAST TECHNIQUE: Multidetector CT imaging was performed according to the standard protocol. Multiplanar CT image reconstructions were also generated. COMPARISON:  None. FINDINGS: Severely comminuted fracture of the surgical neck of the left proximal humerus involving the greater and lesser tuberosities. The greater tuberosity is displaced approximately 1.1 cm. There is a second major fracture fragment anteriorly mid displaced 11 mm. Small os ossific fragment in the anterior glenohumeral joint space. There is no other fracture or dislocation. There is no lytic or sclerotic osseous lesion. The muscles are normal. There is no muscle atrophy. There is no fluid collection or hematoma. The visualized left lung demonstrates no focal abnormality. IMPRESSION: 1. Severely comminuted fracture of the surgical neck of the left proximal humerus involving the greater and lesser tuberosities as described above. Electronically Signed   By: Kathreen Devoid   On: 02/16/2016 09:48   Dg Shoulder Left  02/27/2016  CLINICAL DATA:  80 year old female post left arthroplasty. Subsequent encounter. EXAM: LEFT SHOULDER - 2+ VIEW COMPARISON:  02/16/2016 CT. FINDINGS: Left shoulder prosthesis in place with 1 screw appearing superiorly located, possibly related to positioning/angulation of exam. This can be assessed on follow-up. Left tuberosity fracture fragment adjacent to the to the surgical neck region. This  was noted on the preoperative CT. IMPRESSION: Left shoulder prosthesis in place with 1 screw appearing superiorly located, possibly related to positioning/angulation of exam. This can be assessed on follow-up. Left tuberosity fracture fragment adjacent to the to the surgical neck region. This was noted on the preoperative CT. Electronically Signed   By: Genia Del M.D.   On: 02/27/2016 15:05   Dg Shoulder Left  02/10/2016  CLINICAL DATA:  Tripped on rug at home. EXAM: LEFT SHOULDER - 2+ VIEW COMPARISON:  None FINDINGS: There is a impacted, comminuted fracture involving the proximal left humerus. No dislocation. IMPRESSION: 1. Impacted and comminuted fracture involves the proximal left humerus. Electronically Signed   By: Kerby Moors M.D.   On: 02/10/2016 20:53    Disposition: 01-Home or Self Care        Follow-up Information    Follow up with Renette Butters, MD.   Specialty:  Orthopedic Surgery   Why:  as scheduled   Contact information:   [address] [place] [id number] [id number]        Signed: Renette Butters 02/29/2016, 3:06 PM

## 2016-02-29 NOTE — Progress Notes (Signed)
Physical Therapy Treatment Patient Details Name: Tasha Moss MRN: [id number] DOB: 1936-09-22 Today's Date: 02/29/2016    History of Present Illness Pt is a 80 y.o. female who describes falling on 02/10/16 and sustaining a proximal humerus fracture. She underwent a  reverse total shoulder arthroplasty on 02/27/16.     PT Comments    Pt making gradual progress with PT regarding mobility. Pt able to increase her ambulation distance and speed but continuing to need assistance for stability. PT to continue to follow and progress mobility as tolerated.   Follow Up Recommendations  SNF;Supervision for mobility/OOB     Equipment Recommendations  None recommended by PT    Recommendations for Other Services       Precautions / Restrictions Precautions Precautions: Shoulder;Fall Required Braces or Orthoses: Sling Restrictions Weight Bearing Restrictions: Yes LUE Weight Bearing: Non weight bearing    Mobility  Bed Mobility               General bed mobility comments: pt up with nursing upon arrival  Transfers Overall transfer level: Needs assistance Equipment used: None Transfers: Sit to/from Stand Sit to Stand: Min assist         General transfer comment: cues for pt to use Rt UE to assist with standing.  Ambulation/Gait Ambulation/Gait assistance: Min assist Ambulation Distance (Feet): 150 Feet Assistive device: 1 person hand held assist Gait Pattern/deviations: Step-through pattern Gait velocity: decreased   General Gait Details: even strides, pt maintains a flexed trunk position which she states is her typical posture.    Stairs            Wheelchair Mobility    Modified Rankin (Stroke Patients Only)       Balance Overall balance assessment: Needs assistance Sitting-balance support: No upper extremity supported Sitting balance-Leahy Scale: Good     Standing balance support: Single extremity supported Standing balance-Leahy Scale:  Poor Standing balance comment: using UE support                    Cognition Arousal/Alertness: Awake/alert Behavior During Therapy: WFL for tasks assessed/performed Overall Cognitive Status: Within Functional Limits for tasks assessed                      Exercises      General Comments        Pertinent Vitals/Pain Pain Assessment: No/denies pain Pain Score: 3  Pain Location: Lt shoulder Pain Descriptors / Indicators: Aching Pain Intervention(s): Limited activity within patient's tolerance;Monitored during session    Home Living                      Prior Function            PT Goals (current goals can now be found in the care plan section) Acute Rehab PT Goals Patient Stated Goal: to rehab then home PT Goal Formulation: With patient Time For Goal Achievement: 03/13/16 Potential to Achieve Goals: Good Progress towards PT goals: Progressing toward goals    Frequency  Min 3X/week    PT Plan Current plan remains appropriate    Co-evaluation             End of Session Equipment Utilized During Treatment: Gait belt Activity Tolerance: Patient tolerated treatment well Patient left: in chair;with call bell/phone within reach;Other (comment) (Lt UE supported)     Time: [id number] PT Time Calculation (min) (ACUTE ONLY): 18 min  Charges:  $Gait Training: 8-22 mins  G Codes:      Cassell Clement, PT, CSCS Pager [phone] Office [phone]  02/29/2016, 10:36 AM

## 2016-02-29 NOTE — Progress Notes (Signed)
She is doing very well, pain control is better  NVI, sling in place   Plan: Sling full time No ROM LUE  Dispo: SNF when bed available

## 2016-02-29 NOTE — Discharge Instructions (Signed)
Keep your incision dry and covered. Stay in a sling full time No weight on your left arm

## 2016-02-29 NOTE — Progress Notes (Signed)
On assessment, pt with blanchable area of redness in sacrum. Pt daughter alerted RN to the area and reported that it was there before pt admission and may have occurred from sliding pt up and down in bed at home. RN acknowledged this info and intervention was cleansing and application of pink foam dressing to pt sacrum. Pt resting comfortably and in no distress. RN will continue to monitor pt.

## 2016-03-01 ENCOUNTER — Other Ambulatory Visit (HOSPITAL_COMMUNITY): Payer: Medicare Other

## 2016-03-01 NOTE — Progress Notes (Signed)
Physical Therapy Treatment Patient Details Name: Tasha Moss MRN: [id number] DOB: [birth year] Today's Date: 03/01/2016    History of Present Illness Pt is a 80 y.o. female who describes falling on 02/10/16 and sustaining a proximal humerus fracture. She underwent a  reverse total shoulder arthroplasty on 02/27/16.     PT Comments    Patient continues to make gradual progress toward mobility goals. Ambulated with [facility] today. Current plan remains appropriate.   Follow Up Recommendations  SNF;Supervision for mobility/OOB     Equipment Recommendations  None recommended by PT    Recommendations for Other Services       Precautions / Restrictions Precautions Precautions: Shoulder;Fall Required Braces or Orthoses: Sling Restrictions Weight Bearing Restrictions: Yes LUE Weight Bearing: Non weight bearing    Mobility  Bed Mobility               General bed mobility comments: OOB in chair upon arrival  Transfers Overall transfer level: Needs assistance Equipment used: None Transfers: Sit to/from Stand Sit to Stand: Min assist         General transfer comment: cues for hand placement and technique; used momentum to come into standing  Ambulation/Gait Ambulation/Gait assistance: Min guard Ambulation Distance (Feet): 180 Feet Assistive device: Straight cane Gait Pattern/deviations: Step-through pattern;Decreased stride length;Trunk flexed Gait velocity: decreased   General Gait Details: verbal and tactile cues for posture with pt able to improve posture for short periods of time; cues for sequencing of gait pattern with use of AD; no LOB and pt reproted feeling more stable with SPC and she is used to using [facility] often at home PTA   Stairs            Wheelchair Mobility    Modified Rankin (Stroke Patients Only)       Balance     Sitting balance-Leahy Scale: Good       Standing balance-Leahy Scale: Poor                      Cognition  Arousal/Alertness: Awake/alert Behavior During Therapy: WFL for tasks assessed/performed Overall Cognitive Status: Within Functional Limits for tasks assessed                      Exercises      General Comments        Pertinent Vitals/Pain Pain Assessment: 0-10 Pain Score: 2  Pain Location: L shoulder Pain Descriptors / Indicators: Aching;Sore Pain Intervention(s): Limited activity within patient's tolerance;Monitored during session;Premedicated before session;Repositioned    Home Living Family/patient expects to be discharged to:: Skilled nursing facility Living Arrangements: Spouse/significant other                  Prior Function            PT Goals (current goals can now be found in the care plan section) Acute Rehab PT Goals Patient Stated Goal: to rehab then home PT Goal Formulation: With patient Time For Goal Achievement: 03/13/16 Potential to Achieve Goals: Good Progress towards PT goals: Progressing toward goals    Frequency  Min 3X/week    PT Plan Current plan remains appropriate    Co-evaluation             End of Session Equipment Utilized During Treatment: Gait belt Activity Tolerance: Patient tolerated treatment well Patient left: in chair;with call bell/phone within reach;with family/visitor present     Time: [id number] PT Time Calculation (min) (ACUTE ONLY): 17  min  Charges:  $Gait Training: 8-22 mins                    G Codes:      Leland Leidel, [place] Pager: [phone]   03/01/2016, 11:41 AM

## 2016-03-01 NOTE — Care Management Important Message (Signed)
Important Message  Patient Details  Name: Tasha Moss MRN: [id number] Date of Birth: 10/19/1936   Medicare Important Message Given:  Yes    Correy Weidner P [place] 03/01/2016, 12:46 PM

## 2016-03-01 NOTE — Progress Notes (Signed)
PT Cancellation Note  Patient Details Name: Tasha Moss MRN: [id number] DOB: [birth year]   Cancelled Treatment:    Reason Eval/Treat Not Completed: Pain limiting ability to participate Pt declined mobility due to c/o pain. RN giving pain medications. PT will check on pt later as time allows.    [place] April, PTA Pager: [phone]   03/01/2016, 9:57 AM

## 2016-03-01 NOTE — Clinical Social Work Placement (Signed)
   CLINICAL SOCIAL WORK PLACEMENT  NOTE  Date:  03/01/2016  Patient Details  Name: Tasha Moss MRN: [id number] Date of Birth: 1936/10/20  Clinical Social Work is seeking post-discharge placement for this patient at the [place] level of care (*CSW will initial, date and re-position this form in  chart as items are completed):  Yes   Patient/family provided with [facility] Work Department's list of facilities offering this level of care within the geographic area requested by the patient (or if unable, by the patient's family).  Yes   Patient/family informed of their freedom to choose among providers that offer the needed level of care, that participate in Medicare, Medicaid or managed care program needed by the patient, have an available bed and are willing to accept the patient.  Yes   Patient/family informed of [facility]'s ownership interest in [facility] and [facility], as well as of the fact that they are under no obligation to receive care at these facilities.  PASRR submitted to EDS on 03/01/16     PASRR number received on 03/01/16     Existing PASRR number confirmed on       FL2 transmitted to all facilities in geographic area requested by pt/family on 03/01/16     FL2 transmitted to all facilities within larger geographic area on       Patient informed that his/her managed care company has contracts with or will negotiate with certain facilities, including the following:            Patient/family informed of bed offers received.  Patient chooses bed at       Physician recommends and patient chooses bed at      Patient to be transferred to   on  .  Patient to be transferred to facility by       Patient family notified on   of transfer.  Name of family member notified:        PHYSICIAN Please sign FL2     Additional Comment:    _______________________________________________ Ross Ludwig, LCSWA 03/01/2016,  8:56 AM

## 2016-03-01 NOTE — Progress Notes (Signed)
Patient ID: Tasha Moss, female   DOB: September 26, 1936, 80 y.o.   MRN: [id number]     Subjective:  Patient reports pain as mild to moderate.  Patient in bed and in no acute distress   Objective:   VITALS:   Filed Vitals:   02/28/16 2053 02/29/16 0540 02/29/16 1300 03/01/16 0647  BP: 123/54 131/63 127/67 122/54  Pulse: 88 91 86 85  Temp: 98.7 F (37.1 C) 98.7 F (37.1 C) 98.1 F (36.7 C) 98.1 F (36.7 C)  TempSrc: Oral Oral Oral Oral  Resp: 16 16 16 16   SpO2: 93% 92% 93% 95%    ABD soft Sensation intact distally Dorsiflexion/Plantar flexion intact Incision: dressing C/D/I and no drainage   Lab Results  Component Value Date   WBC 8.4 02/19/2016   HGB 11.7* 02/19/2016   HCT 35.3* 02/19/2016   MCV 90.1 02/19/2016   PLT 258 02/19/2016     Assessment/Plan: 3 Days Post-Op   Active Problems:   Proximal humeral fracture   Advance diet Up with therapy  Sling at all times NWB left upper ext DC to SNF when bed available   Remonia Richter 03/01/2016, 8:59 AM   Edmonia Lynch MD [phone]

## 2016-03-01 NOTE — Clinical Social Work Note (Signed)
Clinical Social Work Assessment  Patient Details  Name: Tasha Moss MRN: [id number] Date of Birth: [birth year]  Date of referral:  02/29/16               Reason for consult:  Facility Placement                Permission sought to share information with:  Family Supports, Customer service manager Permission granted to share information::  Yes, Verbal Permission Granted  Name::     [place]::  SNF admissions  Relationship::     Contact Information:     Housing/Transportation Living arrangements for the past 2 months:  Single Family Home Source of Information:  Patient, Spouse, Adult Children Patient Interpreter Needed:  None Criminal Activity/Legal Involvement Pertinent to Current Situation/Hospitalization:    Significant Relationships:  Spouse, Adult Children Lives with:  Spouse Do you feel safe going back to the place where you live?  No (Patient feels she needs some short term rehab before she is able to go back home.) Need for family participation in patient care:  No (Coment)  Care giving concerns:  Patient needs some short term rehab before she can return back home.   Social Worker assessment / plan: Patient is a 80 year old female who is married and lives with her husband.  Patient is alert and oriented x4 and able to express her feelings.  Patient states she has never been to rehab before, patient was explained role of CSW and what to expect for SNF placement.  Patient was explained how insurance will pay for patient's stay and also what to expect day of discharge.  CSW explained what to expect once patient is at SNF and how the facility will set up home health for her once she has met her goals.  Patient is motivated to work hard to return back home as soon as she has completed therapy.  Patient's husband and daughter were at bedside and did not express any other questions.  Employment status:  Retired Programmer, applications PT Recommendations:  Mobile / Referral to community resources:  [place]  Patient/Family's Response to care:  Patient agreeable to going to SNF for short term rehab.  Patient/Family's Understanding of and Emotional Response to Diagnosis, Current Treatment, and Prognosis: Patient and family aware of current diagnosis and prognosis.  Emotional Assessment Appearance:  Appears stated age Attitude/Demeanor/Rapport:    Affect (typically observed):  Appropriate, Stable, Calm, Pleasant Orientation:  Oriented to Place, Oriented to Self, Oriented to  Time, Oriented to Situation Alcohol / Substance use:  Not Applicable Psych involvement (Current and /or in the community):  No (Comment)  Discharge Needs  Concerns to be addressed:  Lack of Support Readmission within the last 30 days:  No Current discharge risk:  Lack of support system Barriers to Discharge:  No Barriers Identified   Anell Barr 03/01/2016, 8:49 AM

## 2016-03-01 NOTE — Clinical Social Work Note (Signed)
Patient to be d/c'ed today to [facility].  Patient and family agreeable to plans will transport via husband's car RN to call report.  Evette Cristal, MSW, [place]

## 2016-03-01 NOTE — NC FL2 (Signed)
[place] LEVEL OF CARE SCREENING TOOL     IDENTIFICATION  Patient Name: Tasha Moss Birthdate: 05-22-1936 Sex: female Admission Date (Current Location): 02/27/2016  [facility] and [place] Number:  [employer] and Address:  [facility], [place] [address] [place], [place] [address]      Provider Number: [id number]  Attending Physician Name and Address:  Renette Butters, MD  Relative Name and Phone Number:  Canna, Swadley [phone] or [phone]    Current Level of Care: Hospital Recommended Level of Care: [place] Prior Approval Number:    Date Approved/Denied:   PASRR Number: [id number]  Discharge Plan: SNF    Current Diagnoses: Patient Active Problem List   Diagnosis Date Noted  . Proximal humeral fracture 02/27/2016  . Cerumen impaction 02/10/2015  . Routine general medical examination at a health care facility 06/29/2014  . H/O thyroid nodule 06/21/2013  . DIVERTICULOSIS, COLON 07/18/2010  . ESOPHAGEAL REFLUX 04/09/2010  . Stricture and stenosis of esophagus 02/21/2010  . DYSPHAGIA 02/09/2010  . DEGENERATIVE JOINT DISEASE 11/22/2009  . BENIGN PAROXYSMAL POSITIONAL VERTIGO 05/15/2009  . Essential hypertension 09/28/2008  . SPINAL STENOSIS, LUMBAR 09/28/2008  . UNS ADVRS EFF UNS RX MEDICINAL&BIOLOGICAL SBSTNC 05/30/2008  . PERNICIOUS ANEMIA 03/28/2008  . Hypothyroidism 09/08/2007  . HYPERLIPIDEMIA 09/08/2007  . RESTLESS LEG SYNDROME 09/08/2007  . HASHIMOTO'S THYROIDITIS 02/23/2007    Orientation RESPIRATION BLADDER Height & Weight     Self, Time, Situation, Place  Normal Continent Weight:   Height:     BEHAVIORAL SYMPTOMS/MOOD NEUROLOGICAL BOWEL NUTRITION STATUS      Continent Diet (Regular)  AMBULATORY STATUS COMMUNICATION OF NEEDS Skin   Limited Assist Verbally Surgical wounds                       Personal Care Assistance Level of Assistance  Bathing, Dressing Bathing  Assistance: Limited assistance   Dressing Assistance: Limited assistance     Functional Limitations Info             SPECIAL CARE FACTORS FREQUENCY  PT (By licensed PT), OT (By licensed OT)     PT Frequency: 5x a week OT Frequency: 2x a week            Contractures      Additional Factors Info  Psychotropic, Code Status, Allergies Code Status Info: Full Allergies Info: GABAPENTIN, OTHER, MORPHINE Psychotropic Info: FLUoxetine (PROZAC) capsule 10 mg         Current Medications (03/01/2016):  This is the current hospital active medication list Current Facility-Administered Medications  Medication Dose Route Frequency Provider Last Rate Last Dose  . aspirin tablet 325 mg  325 mg Oral Daily Renette Butters, MD   325 mg at 02/29/16 0739  . cholecalciferol (VITAMIN D) tablet 1,000 Units  1,000 Units Oral Daily Renette Butters, MD   1,000 Units at 02/29/16 [phone]  . dimenhyDRINATE (DRAMAMINE) tablet 50 mg  50 mg Oral Q8H PRN Renette Butters, MD   50 mg at 02/28/16 1949  . docusate sodium (COLACE) capsule 100 mg  100 mg Oral Daily PRN Renette Butters, MD      . docusate sodium (COLACE) capsule 100 mg  100 mg Oral Daily Renette Butters, MD   100 mg at 02/29/16 1318  . FLUoxetine (PROZAC) capsule 10 mg  10 mg Oral Daily Renette Butters, MD   10 mg at 02/29/16 0739  .  HYDROcodone-acetaminophen (NORCO/VICODIN) 5-325 MG per tablet 1-2 tablet  1-2 tablet Oral Q4H PRN Renette Butters, MD   1 tablet at 03/01/16 0149  . lactated ringers infusion   Intravenous Continuous Annye Asa, MD 10 mL/hr at 02/27/16 [phone]    . levothyroxine (SYNTHROID, LEVOTHROID) tablet 125 mcg  125 mcg Oral Once per day on Sun Mon Tue Thu Fri Sat Renette Butters, MD   125 mcg at 02/29/16 [phone]  . levothyroxine (SYNTHROID, LEVOTHROID) tablet 187.5 mcg  187.5 mcg Oral Once per day on Wed Renette Butters, MD   187.5 mcg at 02/28/16 0850  . loratadine (CLARITIN) tablet 10 mg  10 mg Oral Daily Renette Butters, MD   10 mg at 02/29/16 0738  . ondansetron (ZOFRAN) tablet 4 mg  4 mg Oral Q6H PRN Renette Butters, MD       Or  . ondansetron [facility]) injection 4 mg  4 mg Intravenous Q6H PRN Renette Butters, MD      . pantoprazole (PROTONIX) EC tablet 40 mg  40 mg Oral Daily Renette Butters, MD   40 mg at 02/29/16 0738  . pravastatin (PRAVACHOL) tablet 20 mg  20 mg Oral q1800 Renette Butters, MD   20 mg at 02/29/16 1740     Discharge Medications: Please see discharge summary for a list of discharge medications.  Relevant Imaging Results:  Relevant Lab Results:   Additional Information SSN [id number]  Ross Ludwig, [place]

## 2016-03-04 ENCOUNTER — Encounter: Payer: Self-pay | Admitting: Internal Medicine

## 2016-03-04 ENCOUNTER — Non-Acute Institutional Stay (SKILLED_NURSING_FACILITY): Payer: Medicare Other | Admitting: Internal Medicine

## 2016-03-04 DIAGNOSIS — R5381 Other malaise: Secondary | ICD-10-CM | POA: Diagnosis not present

## 2016-03-04 DIAGNOSIS — D62 Acute posthemorrhagic anemia: Secondary | ICD-10-CM | POA: Diagnosis not present

## 2016-03-04 DIAGNOSIS — J3089 Other allergic rhinitis: Secondary | ICD-10-CM

## 2016-03-04 DIAGNOSIS — E890 Postprocedural hypothyroidism: Secondary | ICD-10-CM

## 2016-03-04 DIAGNOSIS — E876 Hypokalemia: Secondary | ICD-10-CM | POA: Diagnosis not present

## 2016-03-04 DIAGNOSIS — K222 Esophageal obstruction: Secondary | ICD-10-CM | POA: Diagnosis not present

## 2016-03-04 DIAGNOSIS — G2581 Restless legs syndrome: Secondary | ICD-10-CM

## 2016-03-04 DIAGNOSIS — K5901 Slow transit constipation: Secondary | ICD-10-CM

## 2016-03-04 DIAGNOSIS — E782 Mixed hyperlipidemia: Secondary | ICD-10-CM

## 2016-03-04 DIAGNOSIS — F419 Anxiety disorder, unspecified: Secondary | ICD-10-CM | POA: Diagnosis not present

## 2016-03-04 DIAGNOSIS — S42202S Unspecified fracture of upper end of left humerus, sequela: Secondary | ICD-10-CM

## 2016-03-04 NOTE — Progress Notes (Signed)
LOCATION: [place]  PCP: Annye Asa, MD   Code Status: Full Code  Goals of care: Advanced Directive information Advanced Directives 03/01/2016  Does patient have an advance directive? Yes  Type of Paramedic of [place];Living will  Does patient want to make changes to advanced directive? No - Patient declined  Copy of advanced directive(s) in chart? No - copy requested       Extended Emergency Contact Information Primary Emergency Contact: Schermer,Charlie Address: [address]          [place], [place] [address] [place] Phone: [phone] Mobile Phone: [phone] Relation: Spouse   Allergies  Allergen Reactions  . Gabapentin     hallucinations  . Other     Strong pain medications cause n/v  . Morphine     Nausea & vomiting    Chief Complaint  Patient presents with  . New Admit To SNF    New Admission     HPI:  Patient is a 80 y.o. female seen today for short term rehabilitation post hospital admission from 02/27/16-03/01/16 with proximal humerus fracture post fall. She underwent left shoulder reverse total arthroplasty on 02/27/16. She is seen in her room today. Pain medication has been helpful.   Review of Systems:  Constitutional: Negative for fever, chills, diaphoresis.  HENT: Negative for headache, congestion, nasal discharge, sore throat, difficulty swallowing.   Eyes: Negative for blurred vision, double vision and discharge. Wears glasses.  Respiratory: Negative for shortness of breath and wheezing. Positive for occassional dry cough.   Cardiovascular: Negative for chest pain, palpitations. positive for swelling in both feet.  Gastrointestinal: Negative for heartburn, nausea, vomiting, abdominal pain. Last bowel movement was 5 days ago. Has been taking stool softener. Genitourinary: Negative for dysuria and flank pain.  Musculoskeletal: Negative for back pain, fall in the facility.  Skin:  Negative for itching, rash.  Neurological:Positive for generalized  weakness and dizziness with change of position. Psychiatric/Behavioral: Negative for depression.    Past Medical History  Diagnosis Date  . Allergy     seasonal & dust  . Anxiety   . Arthritis     DJD  . GERD (gastroesophageal reflux disease)   . Thyroid disease     hypothyroidism post Hashimoto's & partial thyroidectomy  . Hyperlipidemia   . Hypertension   . PONV (postoperative nausea and vomiting)   . History of vertigo   . Family history of adverse reaction to anesthesia     n/v   . Hypothyroidism    Past Surgical History  Procedure Laterality Date  . Colonoscopy      diverticulosis  . Joint replacement  [facility]    THR bilaterally   . Spine surgery      cervical ; Dr Vertell Limber  . Tonsillectomy and adenoidectomy    . Thyroidectomy, partial      benign nodule  . Lumbar disc repair  1989    Dr Arrie Aran  . Vitreous detachment      Dr Claudean Kinds  . Cataract extraction w/ intraocular lens  implant, bilateral    . Total shoulder arthroplasty Left 02/27/2016    Procedure: LEFT REVERSE TOTAL SHOULDER ARTHROPLASTY;  Surgeon: Renette Butters, MD;  Location: [facility];  Service: Orthopedics;  Laterality: Left;   Social History:   reports that she has never smoked. She does not have any smokeless tobacco history on file. She reports that she does not drink alcohol or use  illicit drugs.  Family History  Problem Relation Age of Onset  . Diabetes Mother   . Hepatitis Mother     Hepatitis B,? etiology  . Miscarriages / [place] Mother   . Heart disease Father     CHF  . Cancer Paternal Aunt     GI cancers  . Heart disease Paternal Aunt     CHF  . Cancer Paternal Uncle     lung, GI  . Heart disease Paternal Uncle     CHF  . Stroke Mother 22    Medications:   Medication List       This list is accurate as of: 03/04/16  3:17 PM.  Always use your most recent med list.               aspirin  EC 325 MG tablet  Take 1 tablet (325 mg total) by mouth daily.     Cranberry 200 MG Caps  Take 1 capsule by mouth daily.     dimenhyDRINATE 50 MG tablet  Commonly known as:  DRAMAMINE  Take 50 mg by mouth every 8 (eight) hours as needed for nausea.     docusate sodium 100 MG capsule  Commonly known as:  COLACE  Take 100 mg by mouth daily as needed for mild constipation.     docusate sodium 100 MG capsule  Commonly known as:  COLACE  Take 1 capsule (100 mg total) by mouth 2 (two) times daily. Continue this while taking narcotics to help with bowel movements     FLUoxetine 10 MG capsule  Commonly known as:  PROZAC  TAKE ONE CAPSULE BY MOUTH ONCE DAILY AT BEDTIME     HYDROcodone-acetaminophen 5-325 MG tablet  Commonly known as:  NORCO  Take 1-2 tablets by mouth every 4 (four) hours as needed for moderate pain.     levothyroxine 125 MCG tablet  Commonly known as:  SYNTHROID, LEVOTHROID  Take 125 mcg by mouth daily before breakfast.     loratadine 10 MG tablet  Commonly known as:  CLARITIN  Take 10 mg by mouth daily.     meloxicam 7.5 MG tablet  Commonly known as:  MOBIC  Take 7.5 mg by mouth 2 (two) times daily.     multivitamin tablet  Take 1 tablet by mouth daily.     omeprazole 20 MG capsule  Commonly known as:  PRILOSEC  TAKE ONE CAPSULE BY MOUTH ONCE DAILY IN THE MORNING 30 MINUTES BEFORE BREAKFAST     ondansetron 4 MG tablet  Commonly known as:  ZOFRAN  Take 4 mg by mouth every 6 (six) hours as needed (PONV/GERD).     pravastatin 40 MG tablet  Commonly known as:  PRAVACHOL  TAKE ONE TABLET BY MOUTH AT BEDTIME FOR CHOLESTEROL     rOPINIRole 0.25 MG tablet  Commonly known as:  REQUIP  Take 0.25 mg by mouth at bedtime.     vitamin B-12 250 MCG tablet  Commonly known as:  CYANOCOBALAMIN  Take 250 mcg by mouth daily.     Vitamin D3 1000 units Caps  Take 1,000 Units by mouth daily.        Immunizations: Immunization History  Administered Date(s)  Administered  . Influenza, High Dose Seasonal PF 09/16/2013, 09/13/2015  . Influenza,inj,Quad PF,36+ Mos 09/20/2014  . PPD Test 03/01/2016  . Pneumococcal Conjugate-13 01/03/2015  . Pneumococcal-Unspecified 12/10/2011     Physical Exam: Filed Vitals:   03/04/16 1502  BP: 142/90  Pulse: 78  Temp: 98.4 F (36.9 C)  TempSrc: Oral  Resp: 20  Height: 5\' 6"  (1.676 m)  Weight: 182 lb (82.555 kg)  SpO2: 97%   Body mass index is 29.39 kg/(m^2).  General- elderly female, overweight, in no acute distress Head- normocephalic, atraumatic Nose- no maxillary or frontal sinus tenderness, no nasal discharge Throat- moist mucus membrane  Eyes- PERRLA, EOMI, no pallor, no icterus, no discharge, normal conjunctiva, normal sclera Neck- no cervical lymphadenopathy Cardiovascular- normal s1,s2, no murmur, trace leg edema Respiratory- bilateral clear to auscultation, no wheeze, no rhonchi, no crackles, no use of accessory muscles Abdomen- bowel sounds present, soft, non tender Musculoskeletal- able to move all 4 extremities, left arm in a sling, able to move her fingers. Has good radial pulse. Good capillary refill.  Neurological- alert and oriented to person, place and time Skin- warm and dry, left shoulder surgical dressing in place Psychiatry- normal mood and affect    Labs reviewed: Basic Metabolic Panel:  Recent Labs  07/03/15 1624 01/03/16 1003 02/19/16 02/19/16 1304  NA 141 140 142 142  K 3.6 4.1  --  3.4*  CL 104 104  --  103  CO2 27 31  --  26  GLUCOSE 101* 110*  --  92  BUN 19 21 13 13   CREATININE 0.65 0.68 0.6 0.65  CALCIUM 9.4 9.2  --  8.8*   Liver Function Tests:  Recent Labs  07/03/15 1624 01/03/16 1003  AST 16 19  ALT 13 17  ALKPHOS 73 75  BILITOT 0.5 0.5  PROT 6.8 6.6  ALBUMIN 3.6 3.9   No results for input(s): LIPASE, AMYLASE in the last 8760 hours. No results for input(s): AMMONIA in the last 8760 hours. CBC:  Recent Labs  07/03/15 1624  01/03/16 1003 02/19/16 02/19/16 1304  WBC 7.3 6.9 8.4 8.4  NEUTROABS 4.7 4.4  --   --   HGB 13.2 13.6  --  11.7*  HCT 38.8 40.7  --  35.3*  MCV 90.1 90.6  --  90.1  PLT 205.0 201.0  --  258   Cardiac Enzymes: No results for input(s): CKTOTAL, CKMB, CKMBINDEX, TROPONINI in the last 8760 hours. BNP: Invalid input(s): POCBNP CBG:  Recent Labs  02/27/16 1002  GLUCAP 106*    Radiological Exams: Ct Shoulder Left Wo Contrast  02/16/2016  CLINICAL DATA:  Left shoulder pain, evaluate humeral head fracture, preop evaluation EXAM: CT OF THE LEFT SHOULDER WITHOUT CONTRAST TECHNIQUE: Multidetector CT imaging was performed according to the standard protocol. Multiplanar CT image reconstructions were also generated. COMPARISON:  None. FINDINGS: Severely comminuted fracture of the surgical neck of the left proximal humerus involving the greater and lesser tuberosities. The greater tuberosity is displaced approximately 1.1 cm. There is a second major fracture fragment anteriorly mid displaced 11 mm. Small os ossific fragment in the anterior glenohumeral joint space. There is no other fracture or dislocation. There is no lytic or sclerotic osseous lesion. The muscles are normal. There is no muscle atrophy. There is no fluid collection or hematoma. The visualized left lung demonstrates no focal abnormality. IMPRESSION: 1. Severely comminuted fracture of the surgical neck of the left proximal humerus involving the greater and lesser tuberosities as described above. Electronically Signed   By: Kathreen Devoid   On: 02/16/2016 09:48   Dg Shoulder Left  02/27/2016  CLINICAL DATA:  80 year old female post left arthroplasty. Subsequent encounter. EXAM: LEFT SHOULDER - 2+ VIEW COMPARISON:  02/16/2016 CT. FINDINGS: Left shoulder prosthesis in place with  1 screw appearing superiorly located, possibly related to positioning/angulation of exam. This can be assessed on follow-up. Left tuberosity fracture fragment adjacent  to the to the surgical neck region. This was noted on the preoperative CT. IMPRESSION: Left shoulder prosthesis in place with 1 screw appearing superiorly located, possibly related to positioning/angulation of exam. This can be assessed on follow-up. Left tuberosity fracture fragment adjacent to the to the surgical neck region. This was noted on the preoperative CT. Electronically Signed   By: Genia Del M.D.   On: 02/27/2016 15:05   Dg Shoulder Left  02/10/2016  CLINICAL DATA:  Tripped on rug at home. EXAM: LEFT SHOULDER - 2+ VIEW COMPARISON:  None FINDINGS: There is a impacted, comminuted fracture involving the proximal left humerus. No dislocation. IMPRESSION: 1. Impacted and comminuted fracture involves the proximal left humerus. Electronically Signed   By: Kerby Moors M.D.   On: 02/10/2016 20:53    Assessment/Plan  Physical deconditioning Will have her work with physical therapy and occupational therapy team to help with gait training and muscle strengthening exercises.fall precautions. Skin care. Encourage to be out of bed.   Left proximal humerus fracture S/p left shoulder reversal arthroplasty. Will have patient work with PT/OT as tolerated to regain strength and restore function.  Fall precautions are in place. Continue norco 5-325 mg 1-2 tab q4h prn pain. Continue aspirin 325 mg daily. Has follow up with orthopedics. Continue vitamin d supplement  Blood loss anemia Post op, monitor cbc  Hypokalemia Monitor bmp  Constipation Currently on colace 100 mg bid. Discontinue colace. Add senokot s 2 tab qhs and miralax daily and monitor. Hydration encouraged  Hypothyroidism Continue synthroid  Allergic rhinitis Continue loratadine  gerd Stable, continue prilosec  HLD Continue pravachol  RLS Continue requip and b12 supplement  Anxiety disorder Continue home regimen prozac  OA Continue meloxicam for now but monitor cbc with her on full strength aspirin as  well     Goals of care: short term rehabilitation   Labs/tests ordered: cbc, cmp  Family/ staff Communication: reviewed care plan with patient and nursing supervisor    Blanchie Serve, MD Internal Medicine [place], [place] [address] Cell Phone (Monday-Friday 8 am - 5 pm): [phone] On Call: [phone] and follow prompts after 5 pm and on weekends Office Phone: [phone] Office Fax: [phone]

## 2016-03-08 ENCOUNTER — Non-Acute Institutional Stay (SKILLED_NURSING_FACILITY): Payer: Medicare Other | Admitting: Family

## 2016-03-08 ENCOUNTER — Encounter: Payer: Self-pay | Admitting: Family

## 2016-03-08 DIAGNOSIS — H8110 Benign paroxysmal vertigo, unspecified ear: Secondary | ICD-10-CM | POA: Diagnosis not present

## 2016-03-08 DIAGNOSIS — R262 Difficulty in walking, not elsewhere classified: Secondary | ICD-10-CM | POA: Diagnosis not present

## 2016-03-08 DIAGNOSIS — S42201D Unspecified fracture of upper end of right humerus, subsequent encounter for fracture with routine healing: Secondary | ICD-10-CM | POA: Diagnosis not present

## 2016-03-08 DIAGNOSIS — R110 Nausea: Secondary | ICD-10-CM

## 2016-03-08 NOTE — Progress Notes (Signed)
Location:  [place] Room Number: [place] of Service:  SNF [phone]) Provider: Blanchie Serve, MD   Annye Asa, MD  Patient Care Team: [name], MD as PCP - General (Family Medicine) Inda Castle, MD as Consulting Physician (Gastroenterology)  Extended Emergency Contact Information Primary Emergency Contact: Nieland,Charlie Address: [address]          [place], [place] [address] Johnnette Litter of [place] Phone: [phone] Mobile Phone: [phone] Relation: Spouse  Code Status:  FullCode Goals of care: Advanced Directive information Advanced Directives 03/08/2016  Does patient have an advance directive? Yes  Type of Advance Directive [place]  Does patient want to make changes to advanced directive? No - Patient declined  Copy of advanced directive(s) in chart? No - copy requested     Chief Complaint  Patient presents with  . Acute Visit    HPI:  Pt is a 80 y.o. female seen today at [facility] for an acute visit for medication evaluation. She has a medical history of HTN, Hyperlipidemia, GERD, Vertigo, Hypothyroidism, anxiety, Arthritis among others. She is seen in her room today per facility nurse request who states patient was taking meclizine for vertigo at home but currently on Dramamine PRN. Patient states current Dramamine effective for vertigo. She states due to having work with PT/OT she has had needed vertigo medication often. She also request Zofran changed to as needed instead of schedule.    Past Medical History  Diagnosis Date  . Allergy     seasonal & dust  . Anxiety   . Arthritis     DJD  . GERD (gastroesophageal reflux disease)   . Thyroid disease     hypothyroidism post Hashimoto's & partial thyroidectomy  . Hyperlipidemia   . Hypertension   . PONV (postoperative nausea and vomiting)   . History of vertigo   . Family history of adverse reaction to  anesthesia     n/v   . Hypothyroidism    Past Surgical History  Procedure Laterality Date  . Colonoscopy      diverticulosis  . Joint replacement  [place]    THR bilaterally   . Spine surgery      cervical ; Dr Vertell Limber  . Tonsillectomy and adenoidectomy    . Thyroidectomy, partial      benign nodule  . Lumbar disc repair  1989    Dr Arrie Aran  . Vitreous detachment      Dr Claudean Kinds  . Cataract extraction w/ intraocular lens  implant, bilateral    . Total shoulder arthroplasty Left 02/27/2016    Procedure: LEFT REVERSE TOTAL SHOULDER ARTHROPLASTY;  Surgeon: Renette Butters, MD;  Location: [place];  Service: Orthopedics;  Laterality: Left;    Allergies  Allergen Reactions  . Gabapentin     hallucinations  . Other     Strong pain medications cause n/v  . Morphine     Nausea & vomiting      Medication List       This list is accurate as of: 03/08/16  9:56 AM.  Always use your most recent med list.               acetaminophen-codeine 300-30 MG tablet  Commonly known as:  TYLENOL #3  Take 1 tablet by mouth every 4 (four) hours as needed for moderate pain.     aspirin EC 325 MG tablet  Take 1 tablet (325 mg total) by mouth daily.     Cranberry 200 MG Caps  Take 1 capsule by mouth daily.     dimenhyDRINATE 50 MG tablet  Commonly known as:  DRAMAMINE  Take 50 mg by mouth every 8 (eight) hours as needed for nausea.     FLUoxetine 10 MG capsule  Commonly known as:  PROZAC  TAKE ONE CAPSULE BY MOUTH ONCE DAILY AT BEDTIME     glucosamine-chondroitin 500-400 MG tablet  Take 1 tablet by mouth daily.     HYDROcodone-acetaminophen 5-325 MG tablet  Commonly known as:  NORCO  Take 1-2 tablets by mouth every 4 (four) hours as needed for moderate pain.     levothyroxine 125 MCG tablet  Commonly known as:  SYNTHROID, LEVOTHROID  Take 125 mcg by mouth daily before breakfast.     loratadine 10 MG tablet  Commonly known as:  CLARITIN  Take 10 mg by mouth daily.      meloxicam 7.5 MG tablet  Commonly known as:  MOBIC  Take 7.5 mg by mouth 2 (two) times daily.     multivitamin tablet  Take 1 tablet by mouth daily.     omeprazole 20 MG capsule  Commonly known as:  PRILOSEC  TAKE ONE CAPSULE BY MOUTH ONCE DAILY IN THE MORNING 30 MINUTES BEFORE BREAKFAST     ondansetron 4 MG tablet  Commonly known as:  ZOFRAN  Take 4 mg by mouth every 6 (six) hours as needed (PONV/GERD).     polyethylene glycol packet  Commonly known as:  MIRALAX / GLYCOLAX  Take 17 g by mouth daily.     pravastatin 40 MG tablet  Commonly known as:  PRAVACHOL  TAKE ONE TABLET BY MOUTH AT BEDTIME FOR CHOLESTEROL     rOPINIRole 0.25 MG tablet  Commonly known as:  REQUIP  Take 0.25 mg by mouth at bedtime.     sennosides-docusate sodium 8.6-50 MG tablet  Commonly known as:  SENOKOT-S  Take 2 tablets by mouth at bedtime.     vitamin B-12 250 MCG tablet  Commonly known as:  CYANOCOBALAMIN  Take 250 mcg by mouth daily.     Vitamin D3 1000 units Caps  Take 1,000 Units by mouth daily.        Review of Systems  Constitutional: Negative for fever, chills, activity change, appetite change and fatigue.  HENT: Negative.   Eyes: Negative.   Respiratory: Negative.   Cardiovascular: Negative.   Gastrointestinal: Negative for vomiting, abdominal pain, diarrhea, constipation and abdominal distention.  Endocrine: Negative.   Genitourinary: Negative.   Musculoskeletal: Positive for gait problem.       Left shoulder pain   Skin: Negative.   Neurological: Positive for dizziness. Negative for seizures, syncope, light-headedness and headaches.  Psychiatric/Behavioral: Negative.     Immunization History  Administered Date(s) Administered  . Influenza, High Dose Seasonal PF 09/16/2013, 09/13/2015  . Influenza,inj,Quad PF,36+ Mos 09/20/2014  . PPD Test 03/01/2016  . Pneumococcal Conjugate-13 01/03/2015  . Pneumococcal-Unspecified 12/10/2011   Pertinent  Health Maintenance  Due  Topic Date Due  . INFLUENZA VACCINE  07/09/2016  . MAMMOGRAM  07/09/2016  . DEXA SCAN  Completed  . PNA vac Low Risk Adult  Completed   Fall Risk  01/03/2016 07/03/2015 06/29/2014 06/21/2013  Falls in the past year? No No No No   Functional Status Survey:    Filed Vitals:   03/08/16 0926  BP: 136/63  Pulse: 83  Temp: 96.9  F (36.1 C)  TempSrc: Oral  Resp: 20  Height: 5\' 6"  (1.676 m)  Weight: 182 lb (82.555 kg)  SpO2: 94%   Body mass index is 29.39 kg/(m^2). Physical Exam  Constitutional: She is oriented to person, place, and time. She appears well-developed and well-nourished. No distress.  HENT:  Head: Normocephalic.  Mouth/Throat: Oropharynx is clear and moist.  Eyes: Conjunctivae and EOM are normal. Pupils are equal, round, and reactive to light. Right eye exhibits no discharge. No scleral icterus.  Neck: Normal range of motion. No JVD present.  Cardiovascular: Normal rate, regular rhythm and intact distal pulses.  Exam reveals friction rub. Exam reveals no gallop.   No murmur heard. Pulmonary/Chest: Effort normal and breath sounds normal. No respiratory distress. She has no wheezes. She has no rales.  Abdominal: Soft. Bowel sounds are normal. She exhibits no distension and no mass. There is no tenderness. There is no rebound and no guarding.  Musculoskeletal: She exhibits no edema or tenderness.  Left shoulder sling in place.   Lymphadenopathy:    She has no cervical adenopathy.  Neurological: She is oriented to person, place, and time.  Skin: Skin is warm and dry. No rash noted. No erythema. No pallor.  Psychiatric: She has a normal mood and affect.    Labs reviewed:  Recent Labs  07/03/15 1624 01/03/16 1003 02/19/16 02/19/16 1304  NA 141 140 142 142  K 3.6 4.1  --  3.4*  CL 104 104  --  103  CO2 27 31  --  26  GLUCOSE 101* 110*  --  92  BUN 19 21 13 13   CREATININE 0.65 0.68 0.6 0.65  CALCIUM 9.4 9.2  --  8.8*    Recent Labs  07/03/15 1624  01/03/16 1003  AST 16 19  ALT 13 17  ALKPHOS 73 75  BILITOT 0.5 0.5  PROT 6.8 6.6  ALBUMIN 3.6 3.9    Recent Labs  07/03/15 1624 01/03/16 1003 02/19/16 02/19/16 1304  WBC 7.3 6.9 8.4 8.4  NEUTROABS 4.7 4.4  --   --   HGB 13.2 13.6  --  11.7*  HCT 38.8 40.7  --  35.3*  MCV 90.1 90.6  --  90.1  PLT 205.0 201.0  --  258   Lab Results  Component Value Date   TSH 2.29 07/03/2015   Lab Results  Component Value Date   HGBA1C 5.5 01/04/2015   Lab Results  Component Value Date   CHOL 164 01/03/2016   HDL 42.10 01/03/2016   LDLCALC 87 01/03/2016   LDLDIRECT 89.0 07/03/2015   TRIG 173.0* 01/03/2016   CHOLHDL 4 01/03/2016    Significant Diagnostic Results in last 30 days:  Ct Shoulder Left Wo Contrast  02/16/2016  CLINICAL DATA:  Left shoulder pain, evaluate humeral head fracture, preop evaluation EXAM: CT OF THE LEFT SHOULDER WITHOUT CONTRAST TECHNIQUE: Multidetector CT imaging was performed according to the standard protocol. Multiplanar CT image reconstructions were also generated. COMPARISON:  None. FINDINGS: Severely comminuted fracture of the surgical neck of the left proximal humerus involving the greater and lesser tuberosities. The greater tuberosity is displaced approximately 1.1 cm. There is a second major fracture fragment anteriorly mid displaced 11 mm. Small os ossific fragment in the anterior glenohumeral joint space. There is no other fracture or dislocation. There is no lytic or sclerotic osseous lesion. The muscles are normal. There is no muscle atrophy. There is no fluid collection or hematoma. The visualized left lung demonstrates no  focal abnormality. IMPRESSION: 1. Severely comminuted fracture of the surgical neck of the left proximal humerus involving the greater and lesser tuberosities as described above. Electronically Signed   By: Kathreen Devoid   On: 02/16/2016 09:48   Dg Shoulder Left  02/27/2016  CLINICAL DATA:  80 year old female post left arthroplasty.  Subsequent encounter. EXAM: LEFT SHOULDER - 2+ VIEW COMPARISON:  02/16/2016 CT. FINDINGS: Left shoulder prosthesis in place with 1 screw appearing superiorly located, possibly related to positioning/angulation of exam. This can be assessed on follow-up. Left tuberosity fracture fragment adjacent to the to the surgical neck region. This was noted on the preoperative CT. IMPRESSION: Left shoulder prosthesis in place with 1 screw appearing superiorly located, possibly related to positioning/angulation of exam. This can be assessed on follow-up. Left tuberosity fracture fragment adjacent to the to the surgical neck region. This was noted on the preoperative CT. Electronically Signed   By: Genia Del M.D.   On: 02/27/2016 15:05   Dg Shoulder Left  02/10/2016  CLINICAL DATA:  Tripped on rug at home. EXAM: LEFT SHOULDER - 2+ VIEW COMPARISON:  None FINDINGS: There is a impacted, comminuted fracture involving the proximal left humerus. No dislocation. IMPRESSION: 1. Impacted and comminuted fracture involves the proximal left humerus. Electronically Signed   By: Kerby Moors M.D.   On: 02/10/2016 20:53    Assessment/Plan Vertigo: Has had frequent episodes due to working with PT/OT but current dramamine effective. Continue dramamine 50 mg Tablet every 8 Hrs PRN  Nausea: request Zofran changed to as needed instead of schedule. Change Zofran 4 mg Tablet to one by mouth Q 6 Hrs PRN     Family/ staff Communication: Reviewed plan with patient and facility Nurse.   Labs/tests ordered:  None

## 2016-03-11 ENCOUNTER — Non-Acute Institutional Stay (SKILLED_NURSING_FACILITY): Payer: Medicare Other | Admitting: Family

## 2016-03-11 DIAGNOSIS — F329 Major depressive disorder, single episode, unspecified: Secondary | ICD-10-CM | POA: Diagnosis not present

## 2016-03-11 DIAGNOSIS — E039 Hypothyroidism, unspecified: Secondary | ICD-10-CM | POA: Diagnosis not present

## 2016-03-11 DIAGNOSIS — G2581 Restless legs syndrome: Secondary | ICD-10-CM | POA: Diagnosis not present

## 2016-03-11 DIAGNOSIS — Z96612 Presence of left artificial shoulder joint: Secondary | ICD-10-CM | POA: Diagnosis not present

## 2016-03-11 DIAGNOSIS — H8110 Benign paroxysmal vertigo, unspecified ear: Secondary | ICD-10-CM

## 2016-03-11 DIAGNOSIS — K219 Gastro-esophageal reflux disease without esophagitis: Secondary | ICD-10-CM

## 2016-03-11 DIAGNOSIS — I10 Essential (primary) hypertension: Secondary | ICD-10-CM

## 2016-03-11 DIAGNOSIS — E782 Mixed hyperlipidemia: Secondary | ICD-10-CM | POA: Diagnosis not present

## 2016-03-11 DIAGNOSIS — F32A Depression, unspecified: Secondary | ICD-10-CM | POA: Insufficient documentation

## 2016-03-11 DIAGNOSIS — F418 Other specified anxiety disorders: Secondary | ICD-10-CM | POA: Insufficient documentation

## 2016-03-11 NOTE — Progress Notes (Signed)
Patient ID: Tasha Moss, female   DOB: [birth year], 80 y.o.   MRN: [id number]  Location:  [place] of Service:  SNF [phone])  Provider: Blanchie Serve, MD   PCP: Annye Asa, MD Patient Care Team: [name], MD as PCP - General (Family Medicine) Inda Castle, MD as Consulting Physician (Gastroenterology)  Extended Emergency Contact Information Primary Emergency Contact: Chovanec,Charlie Address: [address]          [place], [place] [address] Johnnette Litter of [place] Phone: [phone] Mobile Phone: [phone] Relation: Spouse  Code Status: Full Code  Goals of care:  Advanced Directive information Advanced Directives 03/08/2016  Does patient have an advance directive? Yes  Type of Advance Directive [place]  Does patient want to make changes to advanced directive? No - Patient declined  Copy of advanced directive(s) in chart? No - copy requested     Allergies  Allergen Reactions  . Gabapentin     hallucinations  . Other     Strong pain medications cause n/v  . Morphine     Nausea & vomiting    Chief Complaint  Patient presents with  . Discharge Note    HPI:  80 y.o. female seen today at [facility] for discharge home. She was here for short term rehabilitation post hospital admission from 02/27/16-03/01/16 with proximal humerus fracture post fall. She underwent left shoulder reverse total arthroplasty on 02/27/16. She has a medical history of HTN, GERD, Arthritis, Hyperlipidemia, Hypothyroidism, RLS, Vertigo amongst others. She is seen in her room today with husband at bedside. She denies any acute issues. She states left shoulder pain well controlled with current regimen.She has worked well with PT/OT now stable for discharge home to continue with PT/OT for ROM, exercise and muscle strengthening. She does not require any DME.        Past Medical History  Diagnosis Date  . Allergy       seasonal & dust  . Anxiety   . Arthritis     DJD  . GERD (gastroesophageal reflux disease)   . Thyroid disease     hypothyroidism post Hashimoto's & partial thyroidectomy  . Hyperlipidemia   . Hypertension   . PONV (postoperative nausea and vomiting)   . History of vertigo   . Family history of adverse reaction to anesthesia     n/v   . Hypothyroidism     Past Surgical History  Procedure Laterality Date  . Colonoscopy      diverticulosis  . Joint replacement  [place]    THR bilaterally   . Spine surgery      cervical ; Dr Vertell Limber  . Tonsillectomy and adenoidectomy    . Thyroidectomy, partial      benign nodule  . Lumbar disc repair  1989    Dr Arrie Aran  . Vitreous detachment      Dr Claudean Kinds  . Cataract extraction w/ intraocular lens  implant, bilateral    . Total shoulder arthroplasty Left 02/27/2016    Procedure: LEFT REVERSE TOTAL SHOULDER ARTHROPLASTY;  Surgeon: Renette Butters, MD;  Location: [place];  Service: Orthopedics;  Laterality: Left;      reports that she has never smoked. She does not have any smokeless tobacco history on file. She reports that she does not drink alcohol or use illicit drugs. Social History   Social History  . Marital Status: Married  Spouse Name: N/A  . Number of Children: N/A  . Years of Education: N/A   Occupational History  . Not on file.   Social History Main Topics  . Smoking status: Never Smoker   . Smokeless tobacco: Not on file  . Alcohol Use: No  . Drug Use: No  . Sexual Activity: Not on file   Other Topics Concern  . Not on file   Social History Narrative   Functional Status Survey:    Allergies  Allergen Reactions  . Gabapentin     hallucinations  . Other     Strong pain medications cause n/v  . Morphine     Nausea & vomiting    Pertinent  Health Maintenance Due  Topic Date Due  . INFLUENZA VACCINE  07/09/2016  . MAMMOGRAM  07/09/2016  . DEXA SCAN  Completed  . PNA vac Low Risk  Adult  Completed    Medications:   Medication List       This list is accurate as of: 03/11/16  4:04 PM.  Always use your most recent med list.               acetaminophen-codeine 300-30 MG tablet  Commonly known as:  TYLENOL #3  Take 1 tablet by mouth every 4 (four) hours as needed for moderate pain.     aspirin EC 325 MG tablet  Take 1 tablet (325 mg total) by mouth daily.     atorvastatin 10 MG tablet  Commonly known as:  LIPITOR  Take 10 mg by mouth daily.     Cranberry 200 MG Caps  Take 1 capsule by mouth daily.     dimenhyDRINATE 50 MG tablet  Commonly known as:  DRAMAMINE  Take 50 mg by mouth every 8 (eight) hours as needed for nausea.     FLUoxetine 10 MG capsule  Commonly known as:  PROZAC  TAKE ONE CAPSULE BY MOUTH ONCE DAILY AT BEDTIME     glucosamine-chondroitin 500-400 MG tablet  Take 1 tablet by mouth daily.     HYDROcodone-acetaminophen 5-325 MG tablet  Commonly known as:  NORCO  Take 1-2 tablets by mouth every 4 (four) hours as needed for moderate pain.     levothyroxine 125 MCG tablet  Commonly known as:  SYNTHROID, LEVOTHROID  Take 125 mcg by mouth daily before breakfast.     loratadine 10 MG tablet  Commonly known as:  CLARITIN  Take 10 mg by mouth daily.     meloxicam 7.5 MG tablet  Commonly known as:  MOBIC  Take 7.5 mg by mouth 2 (two) times daily.     multivitamin tablet  Take 1 tablet by mouth daily.     omeprazole 20 MG capsule  Commonly known as:  PRILOSEC  TAKE ONE CAPSULE BY MOUTH ONCE DAILY IN THE MORNING 30 MINUTES BEFORE BREAKFAST     ondansetron 4 MG tablet  Commonly known as:  ZOFRAN  Take 4 mg by mouth every 6 (six) hours as needed (PONV/GERD).     polyethylene glycol packet  Commonly known as:  MIRALAX / GLYCOLAX  Take 17 g by mouth daily.     rOPINIRole 0.25 MG tablet  Commonly known as:  REQUIP  Take 0.25 mg by mouth at bedtime.     sennosides-docusate sodium 8.6-50 MG tablet  Commonly known as:  SENOKOT-S   Take 2 tablets by mouth at bedtime.     vitamin B-12 250 MCG tablet  Commonly known as:  CYANOCOBALAMIN  Take 250 mcg by mouth daily.     Vitamin D3 1000 units Caps  Take 1,000 Units by mouth daily.        Review of Systems  Constitutional: Negative for fever, chills, activity change, appetite change and fatigue.  HENT: Negative.   Eyes: Negative.   Respiratory: Negative.   Cardiovascular: Negative.   Gastrointestinal: Negative for vomiting, abdominal pain, diarrhea, constipation and abdominal distention.  Endocrine: Negative.   Genitourinary: Negative.   Musculoskeletal: Positive for gait problem.       Left shoulder pain   Skin: Negative.   Neurological: Negative for dizziness, seizures, syncope, light-headedness and headaches.  Psychiatric/Behavioral: Negative.     Filed Vitals:   03/11/16 1551  BP: 114/67  Pulse: 75  Temp: 97.5 F (36.4 C)  Resp: 20  Height: 5\' 6"  (1.676 m)  Weight: 183 lb (83.008 kg)  SpO2: 98%   Body mass index is 29.55 kg/(m^2). Physical Exam  Constitutional: She is oriented to person, place, and time. She appears well-developed and well-nourished. No distress.  HENT:  Head: Normocephalic.  Mouth/Throat: Oropharynx is clear and moist.  Eyes: Conjunctivae and EOM are normal. Pupils are equal, round, and reactive to light. Right eye exhibits no discharge. Left eye exhibits no discharge. No scleral icterus.  Neck: Normal range of motion. No JVD present. No thyromegaly present.  Cardiovascular: Normal rate, regular rhythm, normal heart sounds and intact distal pulses.  Exam reveals no gallop and no friction rub.   No murmur heard. Pulmonary/Chest: Effort normal and breath sounds normal. No respiratory distress. She has no wheezes. She has no rales.  Abdominal: Soft. Bowel sounds are normal. She exhibits no distension and no mass. There is no tenderness. There is no rebound and no guarding.  Musculoskeletal: She exhibits no edema or tenderness.   Left forearm sling in place.   Lymphadenopathy:    She has no cervical adenopathy.  Neurological: She is oriented to person, place, and time.  Skin: Skin is warm and dry. No rash noted. No erythema. No pallor.  Psychiatric: She has a normal mood and affect.    Labs reviewed: Basic Metabolic Panel:  Recent Labs  07/03/15 1624 01/03/16 1003 02/19/16 02/19/16 1304  NA 141 140 142 142  K 3.6 4.1  --  3.4*  CL 104 104  --  103  CO2 27 31  --  26  GLUCOSE 101* 110*  --  92  BUN 19 21 13 13   CREATININE 0.65 0.68 0.6 0.65  CALCIUM 9.4 9.2  --  8.8*   Liver Function Tests:  Recent Labs  07/03/15 1624 01/03/16 1003  AST 16 19  ALT 13 17  ALKPHOS 73 75  BILITOT 0.5 0.5  PROT 6.8 6.6  ALBUMIN 3.6 3.9   No results for input(s): LIPASE, AMYLASE in the last 8760 hours. No results for input(s): AMMONIA in the last 8760 hours. CBC:  Recent Labs  07/03/15 1624 01/03/16 1003 02/19/16 02/19/16 1304  WBC 7.3 6.9 8.4 8.4  NEUTROABS 4.7 4.4  --   --   HGB 13.2 13.6  --  11.7*  HCT 38.8 40.7  --  35.3*  MCV 90.1 90.6  --  90.1  PLT 205.0 201.0  --  258   Cardiac Enzymes: No results for input(s): CKTOTAL, CKMB, CKMBINDEX, TROPONINI in the last 8760 hours. BNP: Invalid input(s): POCBNP CBG:  Recent Labs  02/27/16 1002  GLUCAP 106*    Procedures and Imaging Studies During Stay:  Ct Shoulder Left Wo Contrast  02/16/2016  CLINICAL DATA:  Left shoulder pain, evaluate humeral head fracture, preop evaluation EXAM: CT OF THE LEFT SHOULDER WITHOUT CONTRAST TECHNIQUE: Multidetector CT imaging was performed according to the standard protocol. Multiplanar CT image reconstructions were also generated. COMPARISON:  None. FINDINGS: Severely comminuted fracture of the surgical neck of the left proximal humerus involving the greater and lesser tuberosities. The greater tuberosity is displaced approximately 1.1 cm. There is a second major fracture fragment anteriorly mid displaced 11 mm.  Small os ossific fragment in the anterior glenohumeral joint space. There is no other fracture or dislocation. There is no lytic or sclerotic osseous lesion. The muscles are normal. There is no muscle atrophy. There is no fluid collection or hematoma. The visualized left lung demonstrates no focal abnormality. IMPRESSION: 1. Severely comminuted fracture of the surgical neck of the left proximal humerus involving the greater and lesser tuberosities as described above. Electronically Signed   By: Kathreen Devoid   On: 02/16/2016 09:48   Dg Shoulder Left  02/27/2016  CLINICAL DATA:  80 year old female post left arthroplasty. Subsequent encounter. EXAM: LEFT SHOULDER - 2+ VIEW COMPARISON:  02/16/2016 CT. FINDINGS: Left shoulder prosthesis in place with 1 screw appearing superiorly located, possibly related to positioning/angulation of exam. This can be assessed on follow-up. Left tuberosity fracture fragment adjacent to the to the surgical neck region. This was noted on the preoperative CT. IMPRESSION: Left shoulder prosthesis in place with 1 screw appearing superiorly located, possibly related to positioning/angulation of exam. This can be assessed on follow-up. Left tuberosity fracture fragment adjacent to the to the surgical neck region. This was noted on the preoperative CT. Electronically Signed   By: Genia Del M.D.   On: 02/27/2016 15:05   Dg Shoulder Left  02/10/2016  CLINICAL DATA:  Tripped on rug at home. EXAM: LEFT SHOULDER - 2+ VIEW COMPARISON:  None FINDINGS: There is a impacted, comminuted fracture involving the proximal left humerus. No dislocation. IMPRESSION: 1. Impacted and comminuted fracture involves the proximal left humerus. Electronically Signed   By: Kerby Moors M.D.   On: 02/10/2016 20:53    Assessment/Plan:   1. Status post reverse arthroplasty of left shoulder She is S/p short term rehabilitation post hospital admission from 02/27/16-03/01/16 with proximal humerus fracture post  fall. She underwent left shoulder reverse total arthroplasty on 02/27/16. Will discharge home to continue with PT/OT for ROM, exercise and muscle strengthening. She does not require any DME.     2. Hypothyroidism, unspecified hypothyroidism type Continue on Levothyroxine 125 mcg Tablet. PCP to monitor TSH level 3. Gastroesophageal reflux disease without esophagitis Asymptomatic. Continue on Omeprazole.  4. HYPERLIPIDEMIA Continue on Atorvastatin 10 mg Tablet. Follow up with PCP to monitor lipid panel.   5. RESTLESS LEG SYNDROME Continue Requip 6. Essential hypertension B/p stable. Continue to monitor.  7. Benign paroxysmal positional vertigo, unspecified laterality Dramamine effective. Continue to monitor.  8. Depression No mood changes. Continue on Prozac 10 mg Tablet. Follow up with PCP to monitor.   Patient is being discharged with the following home health services:    PT/OT for ROM, exercise and muscle strengthening.   Patient is being discharged with the following durable medical equipment:   She does not require any DME.    Patient has been advised to f/u with their PCP in 1-2 weeks to bring them up to date on their rehab stay.  Social services at facility was responsible for arranging this appointment.  Pt  was provided with a 30 day supply of prescriptions for medications and refills must be obtained from their PCP.  For controlled substances, a more limited supply may be provided adequate until PCP appointment only.  Future labs/tests needed: CBC,BMP,TSH, lipid panel with PCP

## 2016-03-18 ENCOUNTER — Telehealth: Payer: Self-pay | Admitting: General Practice

## 2016-03-18 NOTE — Telephone Encounter (Signed)
Spoke With [employer] nurse and gave verbal ok for nursing to come to home 2x per week/4 weeks due to redness of skin around sacral area, no breakdown of skin. Barrier cream was applied.

## 2016-03-25 ENCOUNTER — Ambulatory Visit: Payer: Medicare Other | Admitting: Family Medicine

## 2016-04-02 ENCOUNTER — Encounter (HOSPITAL_BASED_OUTPATIENT_CLINIC_OR_DEPARTMENT_OTHER): Payer: Medicare Other | Attending: Surgery

## 2016-04-10 ENCOUNTER — Other Ambulatory Visit: Payer: Self-pay | Admitting: Orthopedic Surgery

## 2016-04-10 DIAGNOSIS — M545 Low back pain, unspecified: Secondary | ICD-10-CM

## 2016-04-10 DIAGNOSIS — R29898 Other symptoms and signs involving the musculoskeletal system: Secondary | ICD-10-CM

## 2016-04-16 ENCOUNTER — Ambulatory Visit
Admission: RE | Admit: 2016-04-16 | Discharge: 2016-04-16 | Disposition: A | Discharge status: Home / Self Care | Payer: Medicare Other | Source: Ambulatory Visit | Attending: Orthopedic Surgery | Admitting: Orthopedic Surgery

## 2016-04-16 DIAGNOSIS — R29898 Other symptoms and signs involving the musculoskeletal system: Secondary | ICD-10-CM

## 2016-04-16 DIAGNOSIS — M545 Low back pain, unspecified: Secondary | ICD-10-CM

## 2016-06-03 ENCOUNTER — Other Ambulatory Visit: Payer: Self-pay | Admitting: Family Medicine

## 2016-06-03 NOTE — Telephone Encounter (Signed)
Medication filled to pharmacy as requested.   

## 2016-07-01 ENCOUNTER — Other Ambulatory Visit: Payer: Self-pay | Admitting: Family Medicine

## 2016-07-01 NOTE — Telephone Encounter (Signed)
Medication filled to pharmacy as requested.   

## 2016-07-05 ENCOUNTER — Encounter: Payer: Medicare Other | Admitting: Family Medicine

## 2016-07-07 ENCOUNTER — Other Ambulatory Visit: Payer: Self-pay | Admitting: Family Medicine

## 2016-07-11 ENCOUNTER — Encounter: Payer: Self-pay | Admitting: Family Medicine

## 2016-07-11 ENCOUNTER — Ambulatory Visit (INDEPENDENT_AMBULATORY_CARE_PROVIDER_SITE_OTHER): Payer: Medicare Other | Admitting: Family Medicine

## 2016-07-11 VITALS — BP 119/79 | HR 76 | Temp 98.3°F | Resp 17 | Ht 66.0 in | Wt 180.5 lb

## 2016-07-11 DIAGNOSIS — Z0000 Encounter for general adult medical examination without abnormal findings: Secondary | ICD-10-CM | POA: Diagnosis not present

## 2016-07-11 DIAGNOSIS — E782 Mixed hyperlipidemia: Secondary | ICD-10-CM | POA: Diagnosis not present

## 2016-07-11 DIAGNOSIS — E039 Hypothyroidism, unspecified: Secondary | ICD-10-CM

## 2016-07-11 LAB — CBC WITH DIFFERENTIAL/PLATELET
Basophils Absolute: 0 10*3/uL (ref 0.0–0.1)
Basophils Relative: 0.2 % (ref 0.0–3.0)
Eosinophils Absolute: 0.3 10*3/uL (ref 0.0–0.7)
Eosinophils Relative: 4.6 % (ref 0.0–5.0)
HCT: 38.3 % (ref 36.0–46.0)
Hemoglobin: 13 g/dL (ref 12.0–15.0)
Lymphocytes Relative: 22.4 % (ref 12.0–46.0)
Lymphs Abs: 1.5 10*3/uL (ref 0.7–4.0)
MCHC: 34 g/dL (ref 30.0–36.0)
MCV: 90 fl (ref 78.0–100.0)
Monocytes Absolute: 0.5 10*3/uL (ref 0.1–1.0)
Monocytes Relative: 7.9 % (ref 3.0–12.0)
Neutro Abs: 4.3 10*3/uL (ref 1.4–7.7)
Neutrophils Relative %: 64.9 % (ref 43.0–77.0)
Platelets: 213 10*3/uL (ref 150.0–400.0)
RBC: 4.25 Mil/uL (ref 3.87–5.11)
RDW: 14.6 % (ref 11.5–15.5)
WBC: 6.7 10*3/uL (ref 4.0–10.5)

## 2016-07-11 LAB — BASIC METABOLIC PANEL
BUN: 17 mg/dL (ref 6–23)
CO2: 30 mEq/L (ref 19–32)
Calcium: 9.8 mg/dL (ref 8.4–10.5)
Chloride: 103 mEq/L (ref 96–112)
Creatinine, Ser: 0.67 mg/dL (ref 0.40–1.20)
GFR: 90.36 mL/min (ref >60.00)
Glucose, Bld: 96 mg/dL (ref 70–99)
Potassium: 4.4 mEq/L (ref 3.5–5.1)
Sodium: 141 mEq/L (ref 135–145)

## 2016-07-11 LAB — LIPID PANEL
Cholesterol: 168 mg/dL (ref 0–200)
HDL: 42.5 mg/dL (ref >39.00)
NonHDL: 125.27
Total CHOL/HDL Ratio: 4
Triglycerides: 209 mg/dL — ABNORMAL HIGH (ref 0.0–149.0)
VLDL: 41.8 mg/dL — ABNORMAL HIGH (ref 0.0–40.0)

## 2016-07-11 LAB — HEPATIC FUNCTION PANEL
ALT: 12 U/L (ref 0–35)
AST: 15 U/L (ref 0–37)
Albumin: 4.1 g/dL (ref 3.5–5.2)
Alkaline Phosphatase: 74 U/L (ref 39–117)
Bilirubin, Direct: 0.1 mg/dL (ref 0.0–0.3)
Total Bilirubin: 0.8 mg/dL (ref 0.2–1.2)
Total Protein: 6.7 g/dL (ref 6.0–8.3)

## 2016-07-11 LAB — TSH: TSH: 2.41 u[IU]/mL (ref 0.35–4.50)

## 2016-07-11 LAB — LDL CHOLESTEROL, DIRECT: Direct LDL: 94 mg/dL

## 2016-07-11 MED ORDER — MELOXICAM 7.5 MG PO TABS: 7.5000 mg | ORAL_TABLET | Freq: Two times a day (BID) | ORAL | 2 refills | Status: DC

## 2016-07-11 NOTE — Progress Notes (Signed)
   Subjective:    Patient ID: Tasha Moss, female    DOB: [birth year], 80 y.o.   MRN: [id number]  HPI Here today for CPE.  Risk Factors: Hyperlipidemia- chronic problem, on Pravastatin daily Hypothyroid- chronic problem, on Levothyroxine Physical Activity: limited due to spinal stenosis but active in PT and home exercise program Fall Risk: elevated Depression: chronic problem, currently well controlled on Prozac Hearing: decreased to conversational tones- wears hearing aides ADL's: needs assistance w/ dressing/bathing Cognitive: normal linear thought process, memory and attention intact Home Safety: safe at home, lives w/ husband Height, Weight, BMI, Visual Acuity: see vitals, vision corrected to 20/20 w/ glasses Counseling: UTD on colonoscopy.  Due for mammo- pt has been unable to do this due to arm pain s/p surgery.  UTD on immunizations. Care team reviewed and updated Labs Ordered: See A&P Care Plan: See A&P    Review of Systems Patient reports no vision/ hearing changes, adenopathy,fever, weight change,  persistant/recurrent hoarseness , swallowing issues, chest pain, palpitations, edema, persistant/recurrent cough, hemoptysis, dyspnea (rest/exertional/paroxysmal nocturnal), gastrointestinal bleeding (melena, rectal bleeding), abdominal pain, significant heartburn, bowel changes, GU symptoms (dysuria, hematuria, incontinence), Gyn symptoms (abnormal  bleeding, pain),  syncope, focal weakness, memory loss, numbness & tingling, skin/hair/nail changes, abnormal bruising or bleeding, anxiety, or depression.     Objective:   Physical Exam General Appearance:    Alert, cooperative, no distress, appears stated age, obese  Head:    Normocephalic, without obvious abnormality, atraumatic  Eyes:    PERRL, conjunctiva/corneas clear, EOM's intact, fundi    benign, both eyes  Ears:    Normal TM's and external ear canals, both ears  Nose:   Nares normal, septum midline, mucosa normal, no  drainage    or sinus tenderness  Throat:   Lips, mucosa, and tongue normal; teeth and gums normal  Neck:   Supple, symmetrical, trachea midline, no adenopathy;    Thyroid: no enlargement/tenderness/nodules  Back:     Symmetric, no curvature, ROM normal, no CVA tenderness  Lungs:     Clear to auscultation bilaterally, respirations unlabored  Chest Wall:    No tenderness or deformity   Heart:    Regular rate and rhythm, S1 and S2 normal, no murmur, rub   or gallop  Breast Exam:    Deferred to mammo  Abdomen:     Soft, non-tender, bowel sounds active all four quadrants,    no masses, no organomegaly  Genitalia:    Deferred  Rectal:    Extremities:   Extremities normal, atraumatic, no cyanosis or edema  Pulses:   2+ and symmetric all extremities  Skin:   Skin color, texture, turgor normal, no rashes or lesions  Lymph nodes:   Cervical, supraclavicular, and axillary nodes normal  Neurologic:   CNII-XII intact, normal strength, sensation and reflexes    throughout          Assessment & Plan:

## 2016-07-11 NOTE — Patient Instructions (Signed)
Follow up in 6 months to recheck cholesterol and thyroid We'll notify you of your lab results and make any changes if needed When you feel ready, schedule your mammogram You are up to date on colonoscopy and won't need another You are up to date on your immunizations- yay! Continue to work on healthy diet and regular exercise- you can do it! Call with any questions or concerns Enjoy the rest of your summer!!!

## 2016-07-11 NOTE — Progress Notes (Signed)
Pre visit review using our clinic review tool, if applicable. No additional management support is needed unless otherwise documented below in the visit note. 

## 2016-07-16 NOTE — Assessment & Plan Note (Signed)
Chronic problem.  Currently asymptomatic.  Check labs and adjust meds prn. 

## 2016-07-16 NOTE — Assessment & Plan Note (Signed)
Pt's PE unchanged from previous.  UTD on colonoscopy and immunizations.  Due for mammo but she is unable to move her L arm into the appropriate position after shoulder surgery.  Written screening schedule updated and given to pt.  Check labs.  Anticipatory guidance provided.

## 2016-07-16 NOTE — Assessment & Plan Note (Signed)
Chronic problem.  Tolerating statin w/o difficulty.  Reviewed importance of healthy diet and exercise as able.  Check labs.  Adjust meds prn

## 2016-07-21 ENCOUNTER — Other Ambulatory Visit: Payer: Self-pay | Admitting: Family Medicine

## 2016-07-23 ENCOUNTER — Other Ambulatory Visit: Payer: Self-pay | Admitting: Family Medicine

## 2016-08-24 ENCOUNTER — Other Ambulatory Visit: Payer: Self-pay | Admitting: Family Medicine

## 2016-09-09 ENCOUNTER — Ambulatory Visit (INDEPENDENT_AMBULATORY_CARE_PROVIDER_SITE_OTHER): Payer: Medicare Other

## 2016-09-09 DIAGNOSIS — Z23 Encounter for immunization: Secondary | ICD-10-CM

## 2016-10-10 ENCOUNTER — Other Ambulatory Visit: Payer: Self-pay | Admitting: Family Medicine

## 2016-10-24 ENCOUNTER — Ambulatory Visit (INDEPENDENT_AMBULATORY_CARE_PROVIDER_SITE_OTHER): Payer: Medicare Other | Admitting: Otolaryngology

## 2016-11-22 ENCOUNTER — Other Ambulatory Visit: Payer: Self-pay | Admitting: Family Medicine

## 2016-11-26 ENCOUNTER — Telehealth: Payer: Self-pay | Admitting: Family Medicine

## 2016-11-26 NOTE — Telephone Encounter (Signed)
Called and gave ok to change medication to another generic due to manufacturer.
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
## 2016-12-16 ENCOUNTER — Other Ambulatory Visit: Payer: Self-pay | Admitting: General Practice

## 2016-12-16 MED ORDER — MELOXICAM 7.5 MG PO TABS: 7.5000 mg | ORAL_TABLET | Freq: Two times a day (BID) | ORAL | 0 refills | Status: DC

## 2016-12-20 ENCOUNTER — Other Ambulatory Visit: Payer: Self-pay | Admitting: Family Medicine

## 2017-01-16 ENCOUNTER — Other Ambulatory Visit: Payer: Self-pay | Admitting: Family Medicine

## 2017-02-13 ENCOUNTER — Other Ambulatory Visit: Payer: Self-pay | Admitting: Family Medicine

## 2017-04-09 ENCOUNTER — Encounter: Payer: Self-pay | Admitting: Family Medicine

## 2017-04-09 ENCOUNTER — Ambulatory Visit (INDEPENDENT_AMBULATORY_CARE_PROVIDER_SITE_OTHER): Payer: Medicare Other | Admitting: Family Medicine

## 2017-04-09 VITALS — BP 120/79 | HR 76 | Temp 98.1°F | Resp 16 | Ht 66.0 in | Wt 183.2 lb

## 2017-04-09 DIAGNOSIS — E039 Hypothyroidism, unspecified: Secondary | ICD-10-CM | POA: Diagnosis not present

## 2017-04-09 DIAGNOSIS — E782 Mixed hyperlipidemia: Secondary | ICD-10-CM | POA: Diagnosis not present

## 2017-04-09 LAB — HEPATIC FUNCTION PANEL
ALT: 17 U/L (ref 0–35)
AST: 17 U/L (ref 0–37)
Albumin: 4 g/dL (ref 3.5–5.2)
Alkaline Phosphatase: 88 U/L (ref 39–117)
Bilirubin, Direct: 0.1 mg/dL (ref 0.0–0.3)
Total Bilirubin: 0.5 mg/dL (ref 0.2–1.2)
Total Protein: 6.5 g/dL (ref 6.0–8.3)

## 2017-04-09 LAB — BASIC METABOLIC PANEL
BUN: 19 mg/dL (ref 6–23)
CO2: 30 mEq/L (ref 19–32)
Calcium: 9.8 mg/dL (ref 8.4–10.5)
Chloride: 104 mEq/L (ref 96–112)
Creatinine, Ser: 0.62 mg/dL (ref 0.40–1.20)
GFR: 98.63 mL/min (ref >60.00)
Glucose, Bld: 87 mg/dL (ref 70–99)
Potassium: 4.9 mEq/L (ref 3.5–5.1)
Sodium: 140 mEq/L (ref 135–145)

## 2017-04-09 LAB — LIPID PANEL
Cholesterol: 162 mg/dL (ref 0–200)
HDL: 41.5 mg/dL (ref >39.00)
NonHDL: 120.18
Total CHOL/HDL Ratio: 4
Triglycerides: 284 mg/dL — ABNORMAL HIGH (ref 0.0–149.0)
VLDL: 56.8 mg/dL — ABNORMAL HIGH (ref 0.0–40.0)

## 2017-04-09 LAB — LDL CHOLESTEROL, DIRECT: Direct LDL: 82 mg/dL

## 2017-04-09 LAB — TSH: TSH: 1.9 u[IU]/mL (ref 0.35–4.50)

## 2017-04-09 NOTE — Progress Notes (Signed)
Pre visit review using our clinic review tool, if applicable. No additional management support is needed unless otherwise documented below in the visit note. 

## 2017-04-09 NOTE — Assessment & Plan Note (Signed)
Chronic problem.  Tolerating statin w/o difficulty.  Stressed need for healthy diet and activity as able.  Check labs.  Adjust meds prn

## 2017-04-09 NOTE — Assessment & Plan Note (Signed)
Chronic problem.  Pt reports increased fatigue recently.  Check TSH and adjust meds prn.

## 2017-04-09 NOTE — Progress Notes (Signed)
   Subjective:    Patient ID: Tasha Moss, female    DOB: 06-18-1936, 81 y.o.   MRN: [id number]  HPI Hypothyroid- chronic problem, currently on Levothyroxine 164mcg daily.  Pt c/o decreased energy- will fall asleep while reading.  Will alternate between loose stools and some constipation.    Hyperlipidemia- chronic problem, on Pravastatin daily.  Last LDL was at goal- 94.  No abd pain, N/V.  No CP, SOB, HAs, visual changes, edema.    Review of Systems For ROS see HPI     Objective:   Physical Exam  Constitutional: She is oriented to person, place, and time. She appears well-developed and well-nourished. No distress.  overweight  HENT:  Head: Normocephalic and atraumatic.  Eyes: Conjunctivae and EOM are normal. Pupils are equal, round, and reactive to light.  Neck: Normal range of motion. Neck supple. No thyromegaly present.  Cardiovascular: Normal rate, regular rhythm, normal heart sounds and intact distal pulses.   No murmur heard. Pulmonary/Chest: Effort normal and breath sounds normal. No respiratory distress.  Abdominal: Soft. She exhibits no distension. There is no tenderness.  Musculoskeletal: She exhibits no edema.  Lymphadenopathy:    She has no cervical adenopathy.  Neurological: She is alert and oriented to person, place, and time.  Skin: Skin is warm and dry.  Psychiatric: She has a normal mood and affect. Her behavior is normal.  Vitals reviewed.         Assessment & Plan:

## 2017-04-09 NOTE — Patient Instructions (Signed)
Schedule your complete physical in 6 months and your wellness visit with [name] around the same time [facility] notify you of your lab results and make any changes if needed Keep up the good work!  You look great! Consider getting a tetanus shot at the pharmacy Call with any questions or concerns Happy Mother's Day!

## 2017-04-10 ENCOUNTER — Other Ambulatory Visit: Payer: Self-pay | Admitting: General Practice

## 2017-04-10 ENCOUNTER — Other Ambulatory Visit: Payer: Self-pay | Admitting: Family Medicine

## 2017-04-10 MED ORDER — FENOFIBRATE 160 MG PO TABS: 160.0000 mg | ORAL_TABLET | Freq: Every day | ORAL | 6 refills | Status: DC

## 2017-04-21 ENCOUNTER — Ambulatory Visit (INDEPENDENT_AMBULATORY_CARE_PROVIDER_SITE_OTHER): Payer: Medicare Other | Admitting: Otolaryngology

## 2017-04-21 DIAGNOSIS — H6123 Impacted cerumen, bilateral: Secondary | ICD-10-CM | POA: Diagnosis not present

## 2017-05-09 ENCOUNTER — Other Ambulatory Visit: Payer: Self-pay | Admitting: Family Medicine

## 2017-06-25 ENCOUNTER — Other Ambulatory Visit: Payer: Self-pay | Admitting: Family Medicine

## 2017-07-09 ENCOUNTER — Other Ambulatory Visit: Payer: Self-pay | Admitting: Family Medicine

## 2017-07-15 ENCOUNTER — Other Ambulatory Visit: Payer: Self-pay | Admitting: Family Medicine

## 2017-07-16 ENCOUNTER — Other Ambulatory Visit: Payer: Self-pay | Admitting: Family Medicine

## 2017-07-18 IMAGING — CR DG SHOULDER 2+V*L*
3 series · 3 of 3 positions shown · non-contrast
Comparison: None

CLINICAL DATA: Tripped on rug at home.

EXAM:
LEFT SHOULDER - 2+ VIEW

[t shoulder y-view left]
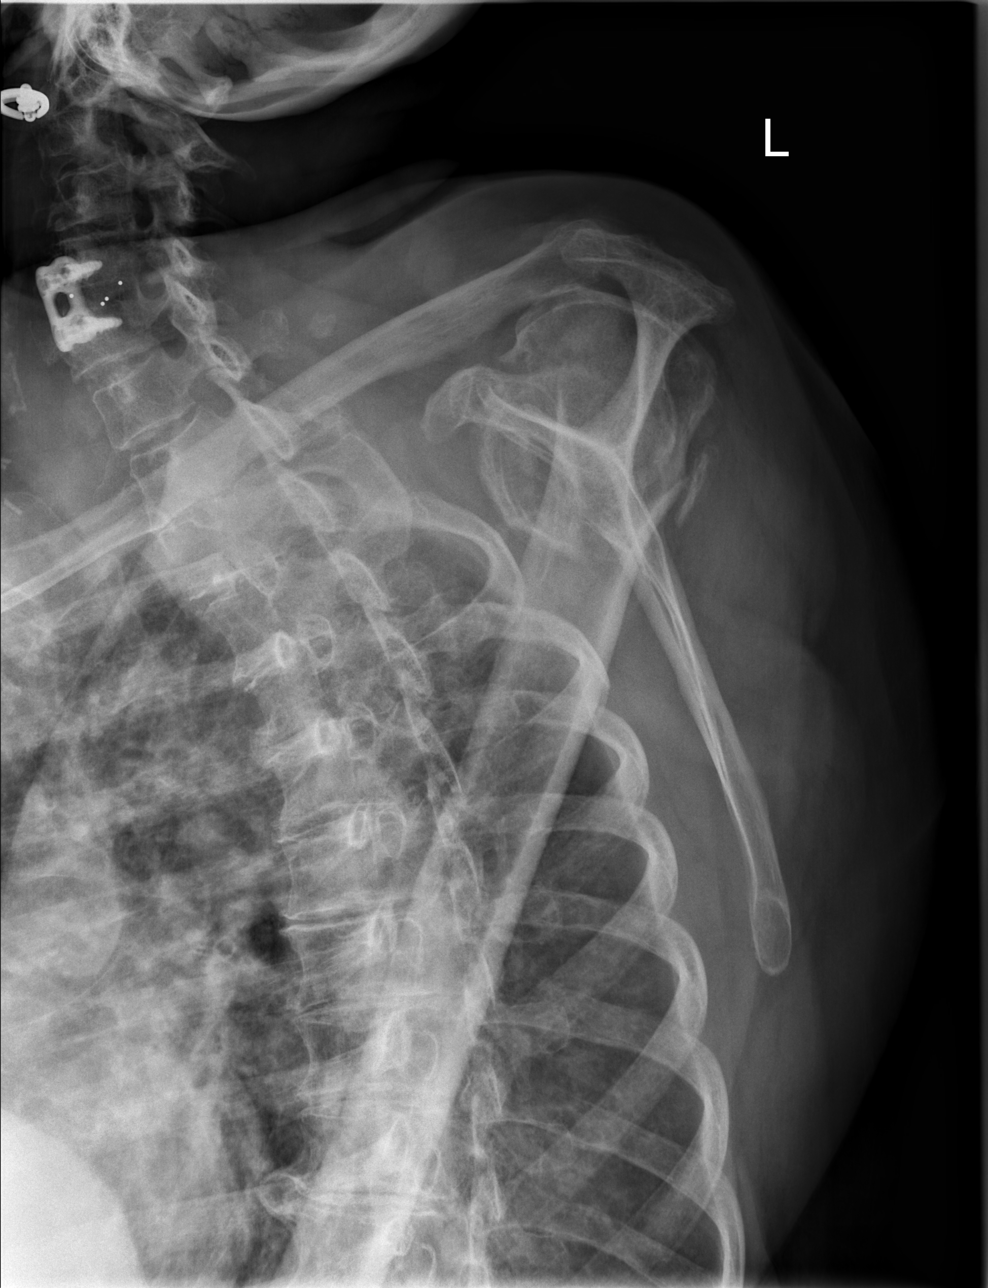

[x shoulder ap left (1 of 2)]
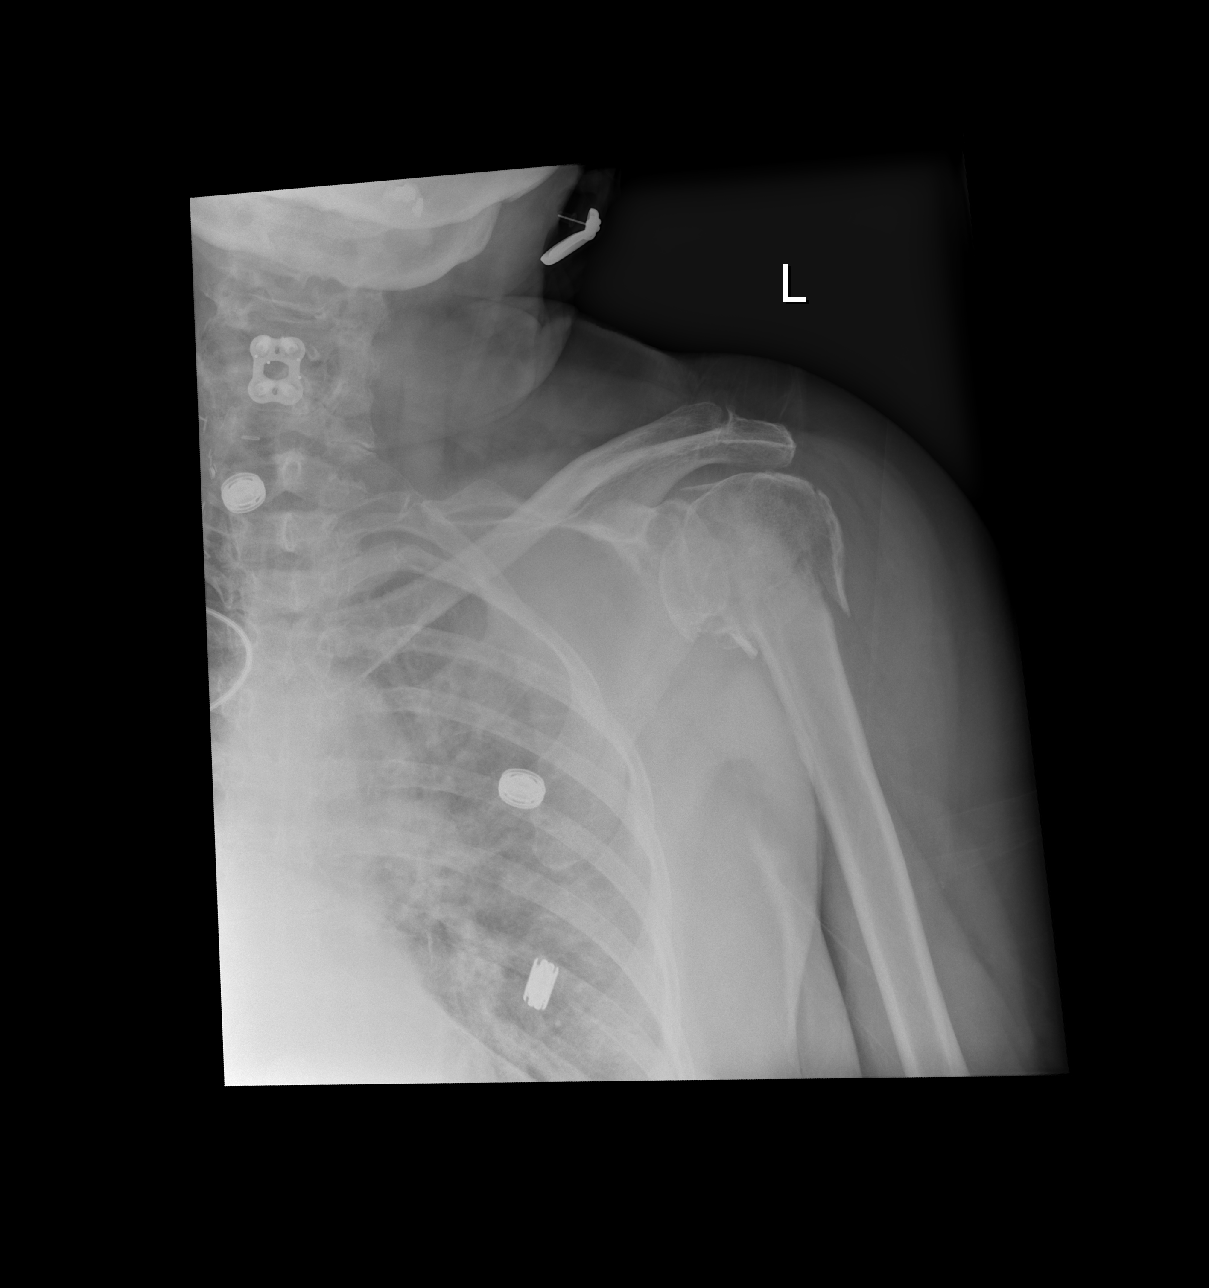

[x shoulder ap left (2 of 2)]
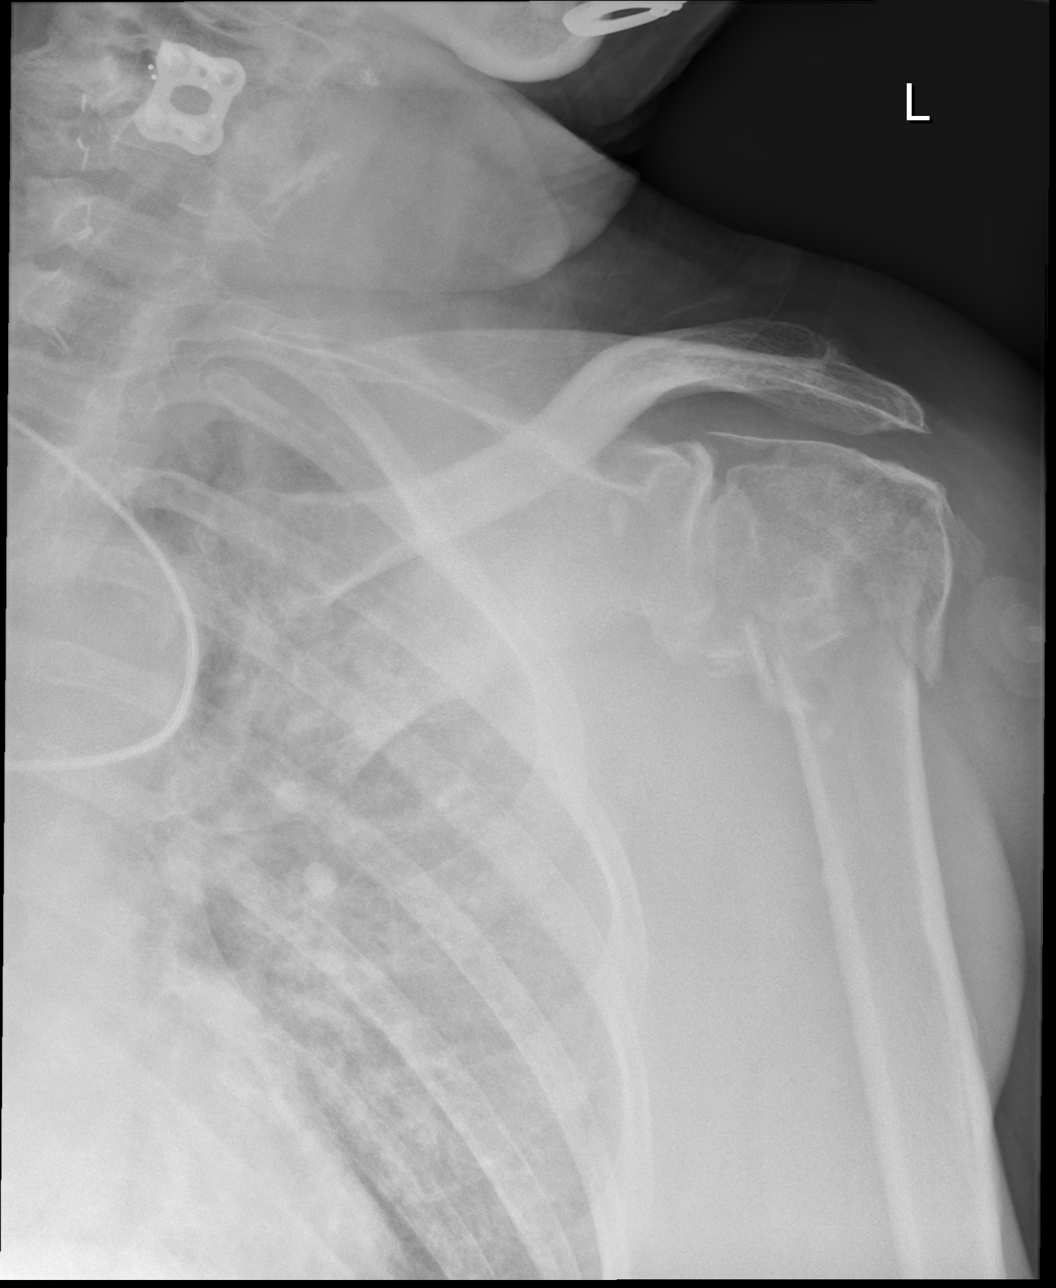

[3 of 3 positions shown; findings below may reference images not displayed]

FINDINGS: There is a impacted, comminuted fracture involving the proximal left
humerus. No dislocation.
IMPRESSION: 1. Impacted and comminuted fracture involves the proximal left
humerus.

## 2017-08-13 ENCOUNTER — Encounter: Payer: Self-pay | Admitting: Family Medicine

## 2017-08-13 ENCOUNTER — Ambulatory Visit (INDEPENDENT_AMBULATORY_CARE_PROVIDER_SITE_OTHER): Payer: Medicare Other | Admitting: Family Medicine

## 2017-08-13 VITALS — BP 124/80 | HR 79 | Resp 16 | Ht 66.0 in | Wt 179.2 lb

## 2017-08-13 DIAGNOSIS — K219 Gastro-esophageal reflux disease without esophagitis: Secondary | ICD-10-CM

## 2017-08-13 MED ORDER — PANTOPRAZOLE SODIUM 40 MG PO TBEC: 40.0000 mg | DELAYED_RELEASE_TABLET | Freq: Every day | ORAL | 3 refills | Status: DC

## 2017-08-13 NOTE — Patient Instructions (Signed)
Follow up as needed/scheduled We'll notify you of your lab results and make any changes if needed STOP the Omeprazole START the Pantoprazole daily Call with any questions or concerns Hang in there!!!

## 2017-08-13 NOTE — Progress Notes (Signed)
Pre visit review using our clinic review tool, if applicable. No additional management support is needed unless otherwise documented below in the visit note. 

## 2017-08-13 NOTE — Progress Notes (Signed)
   Subjective:    Patient ID: Tasha Moss, female    DOB: Jan 15, 1936, 81 y.o.   MRN: [id number]  HPI GERD- pt reports sxs have acutely worsened in the last few weeks.  She has been taking Omeprazole every morning w/o relief.  The only relatively new medication is the Pravastatin.  Pt notes intermittent nausea due to reflux.  Pt is avoiding eating 2 hrs prior to lying down but admits to eating poorly w/ increased greasy foods.  + sour brash.   Review of Systems For ROS see HPI     Objective:   Physical Exam  Constitutional: She is oriented to person, place, and time. She appears well-developed and well-nourished. No distress.  HENT:  Head: Normocephalic and atraumatic.  Cardiovascular: Normal rate, regular rhythm, normal heart sounds and intact distal pulses.   Pulmonary/Chest: Effort normal and breath sounds normal. No respiratory distress. She has no wheezes. She has no rales.  Abdominal: Soft. Bowel sounds are normal. She exhibits no distension. There is no tenderness. There is no rebound and no guarding.  Neurological: She is alert and oriented to person, place, and time.  Skin: Skin is warm and dry.  Psychiatric: She has a normal mood and affect. Her behavior is normal. Thought content normal.  Vitals reviewed.         Assessment & Plan:

## 2017-08-13 NOTE — Assessment & Plan Note (Signed)
Deteriorated.  Pt is having much more severe reflux than previously despite taking her Omeprazole daily.  Will switch to Protonix daily and check for H pylori.  Again reviewed dietary and lifestyle modifications.  Will follow.

## 2017-08-14 LAB — H. PYLORI ANTIBODY, IGG: H Pylori IgG: NEGATIVE

## 2017-10-05 ENCOUNTER — Other Ambulatory Visit: Payer: Self-pay | Admitting: Family Medicine
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
## 2017-10-09 ENCOUNTER — Encounter: Payer: Self-pay | Admitting: Family Medicine

## 2017-10-09 ENCOUNTER — Encounter: Payer: Self-pay | Admitting: General Practice

## 2017-10-09 ENCOUNTER — Ambulatory Visit: Payer: Medicare Other

## 2017-10-09 ENCOUNTER — Ambulatory Visit (INDEPENDENT_AMBULATORY_CARE_PROVIDER_SITE_OTHER): Payer: Medicare Other | Admitting: Family Medicine

## 2017-10-09 VITALS — BP 131/84 | HR 70 | Temp 98.1°F | Resp 18 | Ht 66.0 in | Wt 181.6 lb

## 2017-10-09 DIAGNOSIS — Z1239 Encounter for other screening for malignant neoplasm of breast: Secondary | ICD-10-CM

## 2017-10-09 DIAGNOSIS — E039 Hypothyroidism, unspecified: Secondary | ICD-10-CM | POA: Diagnosis not present

## 2017-10-09 DIAGNOSIS — Z23 Encounter for immunization: Secondary | ICD-10-CM

## 2017-10-09 DIAGNOSIS — Z1231 Encounter for screening mammogram for malignant neoplasm of breast: Secondary | ICD-10-CM | POA: Diagnosis not present

## 2017-10-09 DIAGNOSIS — E782 Mixed hyperlipidemia: Secondary | ICD-10-CM

## 2017-10-09 DIAGNOSIS — E2839 Other primary ovarian failure: Secondary | ICD-10-CM

## 2017-10-09 DIAGNOSIS — Z0000 Encounter for general adult medical examination without abnormal findings: Secondary | ICD-10-CM

## 2017-10-09 LAB — CBC WITH DIFFERENTIAL/PLATELET
Basophils Absolute: 0 10*3/uL (ref 0.0–0.1)
Basophils Relative: 0.5 % (ref 0.0–3.0)
Eosinophils Absolute: 0.3 10*3/uL (ref 0.0–0.7)
Eosinophils Relative: 4 % (ref 0.0–5.0)
HCT: 35.4 % — ABNORMAL LOW (ref 36.0–46.0)
Hemoglobin: 12.3 g/dL (ref 12.0–15.0)
Lymphocytes Relative: 21.9 % (ref 12.0–46.0)
Lymphs Abs: 1.5 10*3/uL (ref 0.7–4.0)
MCHC: 34.9 g/dL (ref 30.0–36.0)
MCV: 90.9 fl (ref 78.0–100.0)
Monocytes Absolute: 0.6 10*3/uL (ref 0.1–1.0)
Monocytes Relative: 8.2 % (ref 3.0–12.0)
Neutro Abs: 4.6 10*3/uL (ref 1.4–7.7)
Neutrophils Relative %: 65.4 % (ref 43.0–77.0)
Platelets: 232 10*3/uL (ref 150.0–400.0)
RBC: 3.89 Mil/uL (ref 3.87–5.11)
RDW: 14.3 % (ref 11.5–15.5)
WBC: 7.1 10*3/uL (ref 4.0–10.5)

## 2017-10-09 LAB — HEPATIC FUNCTION PANEL
ALT: 12 U/L (ref 0–35)
AST: 15 U/L (ref 0–37)
Albumin: 4 g/dL (ref 3.5–5.2)
Alkaline Phosphatase: 41 U/L (ref 39–117)
Bilirubin, Direct: 0.1 mg/dL (ref 0.0–0.3)
Total Bilirubin: 0.6 mg/dL (ref 0.2–1.2)
Total Protein: 6.6 g/dL (ref 6.0–8.3)

## 2017-10-09 LAB — LIPID PANEL
Cholesterol: 156 mg/dL (ref 0–200)
HDL: 48.8 mg/dL (ref >39.00)
LDL Cholesterol: 78 mg/dL (ref 0–99)
NonHDL: 106.92
Total CHOL/HDL Ratio: 3
Triglycerides: 145 mg/dL (ref 0.0–149.0)
VLDL: 29 mg/dL (ref 0.0–40.0)

## 2017-10-09 LAB — BASIC METABOLIC PANEL
BUN: 18 mg/dL (ref 6–23)
CO2: 31 mEq/L (ref 19–32)
Calcium: 9.8 mg/dL (ref 8.4–10.5)
Chloride: 104 mEq/L (ref 96–112)
Creatinine, Ser: 0.64 mg/dL (ref 0.40–1.20)
GFR: 94.96 mL/min (ref >60.00)
Glucose, Bld: 93 mg/dL (ref 70–99)
Potassium: 3.6 mEq/L (ref 3.5–5.1)
Sodium: 143 mEq/L (ref 135–145)

## 2017-10-09 LAB — TSH: TSH: 1.56 u[IU]/mL (ref 0.35–4.50)

## 2017-10-09 MED ORDER — ZOSTER VAC RECOMB ADJUVANTED 50 MCG/0.5ML IM SUSR: 0.5000 mL | Freq: Once | INTRAMUSCULAR | 1 refills | Status: AC

## 2017-10-09 NOTE — Assessment & Plan Note (Signed)
Chronic problem.  Asymptomatic.  Check labs.  Adjust tx prn.

## 2017-10-09 NOTE — Assessment & Plan Note (Signed)
Pt's PE unchanged from previous.  UTD on colonoscopy.  Due for mammo and DEXA- ordered.  UTD on immunizations after today's flu shot (Shingrix ordered).  Check labs.  Anticipatory guidance provided.

## 2017-10-09 NOTE — Assessment & Plan Note (Signed)
Chronic problem.  Tolerating statin w/o difficulty.  Check labs.  Adjust meds prn  

## 2017-10-09 NOTE — Progress Notes (Signed)
   Subjective:    Patient ID: Tasha Moss, female    DOB: 1936-10-28, 81 y.o.   MRN: [id number]  HPI CPE- UTD on colonoscopy.  Due for mammo and DEXA- ordered by Wellness RN.  UTD on immunizations as flu shot was given today.   Review of Systems Patient reports no vision/ hearing changes, adenopathy,fever, weight change,  persistant/recurrent hoarseness , swallowing issues, chest pain, palpitations, edema, persistant/recurrent cough, hemoptysis, dyspnea (rest/exertional/paroxysmal nocturnal), gastrointestinal bleeding (melena, rectal bleeding), abdominal pain, significant heartburn, bowel changes, GU symptoms (dysuria, hematuria, incontinence), Gyn symptoms (abnormal  bleeding, pain),  syncope, memory loss, numbness & tingling, skin/hair/nail changes, abnormal bruising or bleeding, anxiety, or depression.     Objective:   Physical Exam General Appearance:    Alert, cooperative, no distress, appears stated age, obese  Head:    Normocephalic, without obvious abnormality, atraumatic  Eyes:    PERRL, conjunctiva/corneas clear, EOM's intact, fundi    benign, both eyes  Ears:    Normal TM's and external ear canals, both ears  Nose:   Nares normal, septum midline, mucosa normal, no drainage    or sinus tenderness  Throat:   Lips, mucosa, and tongue normal; teeth and gums normal  Neck:   Supple, symmetrical, trachea midline, no adenopathy;    Thyroid: no enlargement/tenderness/nodules  Back:     Symmetric, no curvature, ROM normal, no CVA tenderness  Lungs:     Clear to auscultation bilaterally, respirations unlabored  Chest Wall:    No tenderness or deformity   Heart:    Regular rate and rhythm, S1 and S2 normal, no murmur, rub   or gallop  Breast Exam:    Deferred to mammo  Abdomen:     Soft, non-tender, bowel sounds active all four quadrants,    no masses, no organomegaly  Genitalia:    Deferred  Rectal:    Extremities:   Extremities normal, atraumatic, no cyanosis or edema  Pulses:    2+ and symmetric all extremities  Skin:   Skin color, texture, turgor normal, no rashes or lesions  Lymph nodes:   Cervical, supraclavicular, and axillary nodes normal  Neurologic:   CNII-XII intact, normal strength, ambulates w/ cane          Assessment & Plan:

## 2017-10-09 NOTE — Patient Instructions (Addendum)
Follow up in 6 months to recheck BP and cholesterol We'll notify you of your lab results and make any changes if needed Try and find an outlet for all of your stress- art, music, journal writing, puzzles, etc Call with any questions or concerns Hang in there!  You're doing great and you'll all get through this!  Schedule mammogram and bone scan.   Shingrix vaccine is at the pharmacy.   Continue doing brain stimulating activities (puzzles, reading, adult coloring books, staying active) to keep memory sharp.   Health Maintenance, Female Adopting a healthy lifestyle and getting preventive care can go a long way to promote health and wellness. Talk with your health care provider about what schedule of regular examinations is right for you. This is a good chance for you to check in with your provider about disease prevention and staying healthy. In between checkups, there are plenty of things you can do on your own. Experts have done a lot of research about which lifestyle changes and preventive measures are most likely to keep you healthy. Ask your health care provider for more information. Weight and diet Eat a healthy diet  Be sure to include plenty of vegetables, fruits, low-fat dairy products, and lean protein.  Do not eat a lot of foods high in solid fats, added sugars, or salt.  Get regular exercise. This is one of the most important things you can do for your health. ? Most adults should exercise for at least 150 minutes each week. The exercise should increase your heart rate and make you sweat (moderate-intensity exercise). ? Most adults should also do strengthening exercises at least twice a week. This is in addition to the moderate-intensity exercise.  Maintain a healthy weight  Body mass index (BMI) is a measurement that can be used to identify possible weight problems. It estimates body fat based on height and weight. Your health care provider can help determine your BMI and help  you achieve or maintain a healthy weight.  For females 32 years of age and older: ? A BMI below 18.5 is considered underweight. ? A BMI of 18.5 to 24.9 is normal. ? A BMI of 25 to 29.9 is considered overweight. ? A BMI of 30 and above is considered obese.  Watch levels of cholesterol and blood lipids  You should start having your blood tested for lipids and cholesterol at 79 years of age, then have this test every 5 years.  You may need to have your cholesterol levels checked more often if: ? Your lipid or cholesterol levels are high. ? You are older than 79 years of age. ? You are at high risk for heart disease.  Cancer screening Lung Cancer  Lung cancer screening is recommended for adults 56-54 years old who are at high risk for lung cancer because of a history of smoking.  A yearly low-dose CT scan of the lungs is recommended for people who: ? Currently smoke. ? Have quit within the past 15 years. ? Have at least a 30-pack-year history of smoking. A pack year is smoking an average of one pack of cigarettes a day for 1 year.  Yearly screening should continue until it has been 15 years since you quit.  Yearly screening should stop if you develop a health problem that would prevent you from having lung cancer treatment.  Breast Cancer  Practice breast self-awareness. This means understanding how your breasts normally appear and feel.  It also means doing regular breast self-exams.  Let your health care provider know about any changes, no matter how small.  If you are in your 20s or 30s, you should have a clinical breast exam (CBE) by a health care provider every 1-3 years as part of a regular health exam.  If you are 53 or older, have a CBE every year. Also consider having a breast X-ray (mammogram) every year.  If you have a family history of breast cancer, talk to your health care provider about genetic screening.  If you are at high risk for breast cancer, talk to your  health care provider about having an MRI and a mammogram every year.  Breast cancer gene (BRCA) assessment is recommended for women who have family members with BRCA-related cancers. BRCA-related cancers include: ? Breast. ? Ovarian. ? Tubal. ? Peritoneal cancers.  Results of the assessment will determine the need for genetic counseling and BRCA1 and BRCA2 testing.  Cervical Cancer Your health care provider may recommend that you be screened regularly for cancer of the pelvic organs (ovaries, uterus, and vagina). This screening involves a pelvic examination, including checking for microscopic changes to the surface of your cervix (Pap test). You may be encouraged to have this screening done every 3 years, beginning at age 59.  For women ages 93-65, health care providers may recommend pelvic exams and Pap testing every 3 years, or they may recommend the Pap and pelvic exam, combined with testing for human papilloma virus (HPV), every 5 years. Some types of HPV increase your risk of cervical cancer. Testing for HPV may also be done on women of any age with unclear Pap test results.  Other health care providers may not recommend any screening for nonpregnant women who are considered low risk for pelvic cancer and who do not have symptoms. Ask your health care provider if a screening pelvic exam is right for you.  If you have had past treatment for cervical cancer or a condition that could lead to cancer, you need Pap tests and screening for cancer for at least 20 years after your treatment. If Pap tests have been discontinued, your risk factors (such as having a new sexual partner) need to be reassessed to determine if screening should resume. Some women have medical problems that increase the chance of getting cervical cancer. In these cases, your health care provider may recommend more frequent screening and Pap tests.  Colorectal Cancer  This type of cancer can be detected and often  prevented.  Routine colorectal cancer screening usually begins at 79 years of age and continues through 79 years of age.  Your health care provider may recommend screening at an earlier age if you have risk factors for colon cancer.  Your health care provider may also recommend using home test kits to check for hidden blood in the stool.  A small camera at the end of a tube can be used to examine your colon directly (sigmoidoscopy or colonoscopy). This is done to check for the earliest forms of colorectal cancer.  Routine screening usually begins at age 33.  Direct examination of the colon should be repeated every 5-10 years through 79 years of age. However, you may need to be screened more often if early forms of precancerous polyps or small growths are found.  Skin Cancer  Check your skin from head to toe regularly.  Tell your health care provider about any new moles or changes in moles, especially if there is a change in a mole's shape or color.  Also tell your health care provider if you have a mole that is larger than the size of a pencil eraser.  Always use sunscreen. Apply sunscreen liberally and repeatedly throughout the day.  Protect yourself by wearing long sleeves, pants, a wide-brimmed hat, and sunglasses whenever you are outside.  Heart disease, diabetes, and high blood pressure  High blood pressure causes heart disease and increases the risk of stroke. High blood pressure is more likely to develop in: ? People who have blood pressure in the high end of the normal range (130-139/85-89 mm Hg). ? People who are overweight or obese. ? People who are African American.  If you are 47-81 years of age, have your blood pressure checked every 3-5 years. If you are 28 years of age or older, have your blood pressure checked every year. You should have your blood pressure measured twice-once when you are at a hospital or clinic, and once when you are not at a hospital or clinic.  Record the average of the two measurements. To check your blood pressure when you are not at a hospital or clinic, you can use: ? An automated blood pressure machine at a pharmacy. ? A home blood pressure monitor.  If you are between 84 years and 33 years old, ask your health care provider if you should take aspirin to prevent strokes.  Have regular diabetes screenings. This involves taking a blood sample to check your fasting blood sugar level. ? If you are at a normal weight and have a low risk for diabetes, have this test once every three years after 79 years of age. ? If you are overweight and have a high risk for diabetes, consider being tested at a younger age or more often. Preventing infection Hepatitis B  If you have a higher risk for hepatitis B, you should be screened for this virus. You are considered at high risk for hepatitis B if: ? You were born in a country where hepatitis B is common. Ask your health care provider which countries are considered high risk. ? Your parents were born in a high-risk country, and you have not been immunized against hepatitis B (hepatitis B vaccine). ? You have HIV or AIDS. ? You use needles to inject street drugs. ? You live with someone who has hepatitis B. ? You have had sex with someone who has hepatitis B. ? You get hemodialysis treatment. ? You take certain medicines for conditions, including cancer, organ transplantation, and autoimmune conditions.  Hepatitis C  Blood testing is recommended for: ? Everyone born from [birth year] through 1963. ? Anyone with known risk factors for hepatitis C.  Sexually transmitted infections (STIs)  You should be screened for sexually transmitted infections (STIs) including gonorrhea and chlamydia if: ? You are sexually active and are younger than 79 years of age. ? You are older than 79 years of age and your health care provider tells you that you are at risk for this type of infection. ? Your sexual  activity has changed since you were last screened and you are at an increased risk for chlamydia or gonorrhea. Ask your health care provider if you are at risk.  If you do not have HIV, but are at risk, it may be recommended that you take a prescription medicine daily to prevent HIV infection. This is called pre-exposure prophylaxis (PrEP). You are considered at risk if: ? You are sexually active and do not regularly use condoms or know the HIV status of  your partner(s). ? You take drugs by injection. ? You are sexually active with a partner who has HIV.  Talk with your health care provider about whether you are at high risk of being infected with HIV. If you choose to begin PrEP, you should first be tested for HIV. You should then be tested every 3 months for as long as you are taking PrEP. Pregnancy  If you are premenopausal and you may become pregnant, ask your health care provider about preconception counseling.  If you may become pregnant, take 400 to 800 micrograms (mcg) of folic acid every day.  If you want to prevent pregnancy, talk to your health care provider about birth control (contraception). Osteoporosis and menopause  Osteoporosis is a disease in which the bones lose minerals and strength with aging. This can result in serious bone fractures. Your risk for osteoporosis can be identified using a bone density scan.  If you are 33 years of age or older, or if you are at risk for osteoporosis and fractures, ask your health care provider if you should be screened.  Ask your health care provider whether you should take a calcium or vitamin D supplement to lower your risk for osteoporosis.  Menopause may have certain physical symptoms and risks.  Hormone replacement therapy may reduce some of these symptoms and risks. Talk to your health care provider about whether hormone replacement therapy is right for you. Follow these instructions at home:  Schedule regular health, dental,  and eye exams.  Stay current with your immunizations.  Do not use any tobacco products including cigarettes, chewing tobacco, or electronic cigarettes.  If you are pregnant, do not drink alcohol.  If you are breastfeeding, limit how much and how often you drink alcohol.  Limit alcohol intake to no more than 1 drink per day for nonpregnant women. One drink equals 12 ounces of beer, 5 ounces of wine, or 1 ounces of hard liquor.  Do not use street drugs.  Do not share needles.  Ask your health care provider for help if you need support or information about quitting drugs.  Tell your health care provider if you often feel depressed.  Tell your health care provider if you have ever been abused or do not feel safe at home. This information is not intended to replace advice given to you by your health care provider. Make sure you discuss any questions you have with your health care provider. Document Released: 06/10/2011 Document Revised: 05/02/2016 Document Reviewed: 08/29/2015 Elsevier Interactive Patient Education  [employer].

## 2017-10-18 ENCOUNTER — Other Ambulatory Visit: Payer: Self-pay | Admitting: Family Medicine

## 2017-10-20 ENCOUNTER — Ambulatory Visit (INDEPENDENT_AMBULATORY_CARE_PROVIDER_SITE_OTHER): Payer: Medicare Other | Admitting: Otolaryngology

## 2017-10-20 DIAGNOSIS — H6123 Impacted cerumen, bilateral: Secondary | ICD-10-CM | POA: Diagnosis not present

## 2017-11-06 ENCOUNTER — Telehealth: Payer: Self-pay | Admitting: Family Medicine

## 2017-11-06 ENCOUNTER — Other Ambulatory Visit: Payer: Self-pay

## 2017-11-06 MED ORDER — PANTOPRAZOLE SODIUM 40 MG PO TBEC: 40.0000 mg | DELAYED_RELEASE_TABLET | Freq: Every day | ORAL | 3 refills | Status: DC

## 2017-11-06 NOTE — Telephone Encounter (Signed)
Copied from [place] [id number]. Topic: Quick Communication - See Telephone Encounter >> Nov 06, 2017 11:46 AM Synthia Innocent wrote: CRM for notification. See Telephone encounter for: requesting refill on pantoprazole (PROTONIX) 40 MG tablet, 90day supply.  11/06/17.

## 2017-11-06 NOTE — Telephone Encounter (Signed)
Refill sent as requested. 

## 2017-11-17 ENCOUNTER — Other Ambulatory Visit: Payer: Self-pay | Admitting: Family Medicine

## 2017-12-20 ENCOUNTER — Other Ambulatory Visit: Payer: Self-pay | Admitting: Family Medicine

## 2018-01-05 ENCOUNTER — Other Ambulatory Visit: Payer: Self-pay | Admitting: Family Medicine

## 2018-01-15 ENCOUNTER — Other Ambulatory Visit: Payer: Self-pay | Admitting: Family Medicine

## 2018-01-18 ENCOUNTER — Other Ambulatory Visit: Payer: Self-pay | Admitting: Family Medicine

## 2018-03-10 ENCOUNTER — Other Ambulatory Visit: Payer: Self-pay | Admitting: Family Medicine

## 2018-04-04 ENCOUNTER — Other Ambulatory Visit: Payer: Self-pay | Admitting: Family Medicine

## 2018-04-14 ENCOUNTER — Ambulatory Visit (INDEPENDENT_AMBULATORY_CARE_PROVIDER_SITE_OTHER): Payer: Medicare Other | Admitting: Family Medicine

## 2018-04-14 ENCOUNTER — Encounter: Payer: Self-pay | Admitting: Family Medicine

## 2018-04-14 ENCOUNTER — Other Ambulatory Visit: Payer: Self-pay

## 2018-04-14 VITALS — BP 130/80 | HR 72 | Temp 98.1°F | Resp 16 | Ht 66.0 in | Wt 174.5 lb

## 2018-04-14 DIAGNOSIS — E039 Hypothyroidism, unspecified: Secondary | ICD-10-CM

## 2018-04-14 DIAGNOSIS — E782 Mixed hyperlipidemia: Secondary | ICD-10-CM | POA: Diagnosis not present

## 2018-04-14 LAB — CBC WITH DIFFERENTIAL/PLATELET
Basophils Absolute: 0 10*3/uL (ref 0.0–0.1)
Basophils Relative: 0.6 % (ref 0.0–3.0)
Eosinophils Absolute: 0.3 10*3/uL (ref 0.0–0.7)
Eosinophils Relative: 4.2 % (ref 0.0–5.0)
HCT: 36.5 % (ref 36.0–46.0)
Hemoglobin: 12.5 g/dL (ref 12.0–15.0)
Lymphocytes Relative: 16.3 % (ref 12.0–46.0)
Lymphs Abs: 1.1 10*3/uL (ref 0.7–4.0)
MCHC: 34.3 g/dL (ref 30.0–36.0)
MCV: 90.4 fl (ref 78.0–100.0)
Monocytes Absolute: 0.6 10*3/uL (ref 0.1–1.0)
Monocytes Relative: 8 % (ref 3.0–12.0)
Neutro Abs: 5 10*3/uL (ref 1.4–7.7)
Neutrophils Relative %: 70.9 % (ref 43.0–77.0)
Platelets: 227 10*3/uL (ref 150.0–400.0)
RBC: 4.03 Mil/uL (ref 3.87–5.11)
RDW: 14.6 % (ref 11.5–15.5)
WBC: 7 10*3/uL (ref 4.0–10.5)

## 2018-04-14 LAB — BASIC METABOLIC PANEL
BUN: 18 mg/dL (ref 6–23)
CO2: 31 mEq/L (ref 19–32)
Calcium: 10 mg/dL (ref 8.4–10.5)
Chloride: 103 mEq/L (ref 96–112)
Creatinine, Ser: 0.71 mg/dL (ref 0.40–1.20)
GFR: 84.13 mL/min (ref >60.00)
Glucose, Bld: 103 mg/dL — ABNORMAL HIGH (ref 70–99)
Potassium: 4.8 mEq/L (ref 3.5–5.1)
Sodium: 141 mEq/L (ref 135–145)

## 2018-04-14 LAB — LIPID PANEL
Cholesterol: 161 mg/dL (ref 0–200)
HDL: 49 mg/dL (ref >39.00)
LDL Cholesterol: 88 mg/dL (ref 0–99)
NonHDL: 112.18
Total CHOL/HDL Ratio: 3
Triglycerides: 120 mg/dL (ref 0.0–149.0)
VLDL: 24 mg/dL (ref 0.0–40.0)

## 2018-04-14 LAB — HEPATIC FUNCTION PANEL
ALT: 11 U/L (ref 0–35)
AST: 15 U/L (ref 0–37)
Albumin: 4 g/dL (ref 3.5–5.2)
Alkaline Phosphatase: 40 U/L (ref 39–117)
Bilirubin, Direct: 0.1 mg/dL (ref 0.0–0.3)
Total Bilirubin: 0.5 mg/dL (ref 0.2–1.2)
Total Protein: 6.5 g/dL (ref 6.0–8.3)

## 2018-04-14 LAB — TSH: TSH: 1.78 u[IU]/mL (ref 0.35–4.50)

## 2018-04-14 NOTE — Progress Notes (Signed)
   Subjective:    Patient ID: Tasha Moss, female    DOB: 1936/01/20, 82 y.o.   MRN: [id number]  HPI Hypothyroid- chronic problem, on Levothyroxine 169mcg daily.  Denies swelling of hands/feet, changes to skin/hair/nails.  No excessive fatigue.  Hyperlipidemia- chronic problem, on Pravastatin 40mg  and fenofibrate 160mg  daily.  Pt is down 8 lbs.  Denies CP, SOB, abd pain, N/V.   Review of Systems For ROS see HPI     Objective:   Physical Exam  Constitutional: She is oriented to person, place, and time. She appears well-developed and well-nourished. No distress.  HENT:  Head: Normocephalic and atraumatic.  Eyes: Pupils are equal, round, and reactive to light. Conjunctivae and EOM are normal.  Neck: Normal range of motion. Neck supple. No thyromegaly present.  Cardiovascular: Normal rate, regular rhythm, normal heart sounds and intact distal pulses.  No murmur heard. Pulmonary/Chest: Effort normal and breath sounds normal. No respiratory distress.  Abdominal: Soft. She exhibits no distension. There is no tenderness.  Musculoskeletal: She exhibits no edema.  Lymphadenopathy:    She has no cervical adenopathy.  Neurological: She is alert and oriented to person, place, and time.  Skin: Skin is warm and dry.  Psychiatric: She has a normal mood and affect. Her behavior is normal.  Vitals reviewed.         Assessment & Plan:

## 2018-04-14 NOTE — Patient Instructions (Signed)
Schedule your complete physical in 6 months We'll notify you of your lab results and make any changes if needed Continue to work on healthy diet- you can do it!!! Call with any questions or concerns Happy Mother's Day!!!

## 2018-04-14 NOTE — Assessment & Plan Note (Signed)
Chronic problem.  Currently asymptomatic.  Check labs.  Adjust meds prn  

## 2018-04-14 NOTE — Assessment & Plan Note (Signed)
Chronic problem.  Tolerating statin w/o difficulty.  Stressed need for healthy diet.  Check labs.  Adjust meds prn

## 2018-04-15 ENCOUNTER — Encounter: Payer: Self-pay | Admitting: General Practice

## 2018-04-20 ENCOUNTER — Ambulatory Visit (INDEPENDENT_AMBULATORY_CARE_PROVIDER_SITE_OTHER): Payer: Medicare Other | Admitting: Otolaryngology

## 2018-04-20 DIAGNOSIS — H6123 Impacted cerumen, bilateral: Secondary | ICD-10-CM | POA: Diagnosis not present

## 2018-05-22 ENCOUNTER — Other Ambulatory Visit: Payer: Self-pay | Admitting: Family Medicine

## 2018-06-14 ENCOUNTER — Other Ambulatory Visit: Payer: Self-pay | Admitting: Family Medicine

## 2018-06-27 ENCOUNTER — Other Ambulatory Visit: Payer: Self-pay | Admitting: Family Medicine

## 2018-06-30 ENCOUNTER — Telehealth: Payer: Self-pay | Admitting: Family Medicine

## 2018-06-30 NOTE — Telephone Encounter (Signed)
Pt dropped off DMV forms to be completed, pt states that she is ok to wait until KT gets back. Placed in bin upfront w/charge sheet.

## 2018-06-30 NOTE — Telephone Encounter (Signed)
Paperwork placed in PCP folder for completion 

## 2018-07-03 NOTE — Telephone Encounter (Signed)
Called pt to make aware, pt asked that form be mailed to them. Form has been placed in mail.

## 2018-07-03 NOTE — Telephone Encounter (Signed)
I have reviewed forms and completed in PCP absence. They are at the front desk (file cabinet) for pick up.

## 2018-07-07 ENCOUNTER — Other Ambulatory Visit: Payer: Self-pay | Admitting: Family Medicine

## 2018-07-14 ENCOUNTER — Other Ambulatory Visit: Payer: Self-pay | Admitting: Family Medicine

## 2018-08-26 DIAGNOSIS — H9190 Unspecified hearing loss, unspecified ear: Secondary | ICD-10-CM | POA: Diagnosis not present

## 2018-09-07 ENCOUNTER — Other Ambulatory Visit: Payer: Self-pay | Admitting: Family Medicine

## 2018-09-12 ENCOUNTER — Other Ambulatory Visit: Payer: Self-pay | Admitting: Family Medicine

## 2018-09-18 ENCOUNTER — Ambulatory Visit (INDEPENDENT_AMBULATORY_CARE_PROVIDER_SITE_OTHER): Payer: Medicare Other | Admitting: *Deleted

## 2018-09-18 DIAGNOSIS — Z23 Encounter for immunization: Secondary | ICD-10-CM | POA: Diagnosis not present

## 2018-10-01 ENCOUNTER — Other Ambulatory Visit: Payer: Self-pay | Admitting: Family Medicine

## 2018-10-19 ENCOUNTER — Ambulatory Visit (INDEPENDENT_AMBULATORY_CARE_PROVIDER_SITE_OTHER): Payer: Medicare Other | Admitting: Otolaryngology

## 2018-10-19 DIAGNOSIS — H6123 Impacted cerumen, bilateral: Secondary | ICD-10-CM | POA: Diagnosis not present

## 2018-10-21 ENCOUNTER — Encounter: Payer: Medicare Other | Admitting: Family Medicine

## 2018-11-02 ENCOUNTER — Encounter: Payer: Self-pay | Admitting: Family Medicine

## 2018-11-02 ENCOUNTER — Other Ambulatory Visit: Payer: Self-pay

## 2018-11-02 ENCOUNTER — Ambulatory Visit (INDEPENDENT_AMBULATORY_CARE_PROVIDER_SITE_OTHER): Payer: Medicare Other | Admitting: Family Medicine

## 2018-11-02 VITALS — BP 130/80 | HR 73 | Temp 98.0°F | Resp 16 | Ht 66.0 in | Wt 173.5 lb

## 2018-11-02 DIAGNOSIS — E782 Mixed hyperlipidemia: Secondary | ICD-10-CM

## 2018-11-02 DIAGNOSIS — Z0000 Encounter for general adult medical examination without abnormal findings: Secondary | ICD-10-CM | POA: Diagnosis not present

## 2018-11-02 DIAGNOSIS — E039 Hypothyroidism, unspecified: Secondary | ICD-10-CM

## 2018-11-02 LAB — CBC WITH DIFFERENTIAL/PLATELET
Basophils Absolute: 0 10*3/uL (ref 0.0–0.1)
Basophils Relative: 0.7 % (ref 0.0–3.0)
Eosinophils Absolute: 0.3 10*3/uL (ref 0.0–0.7)
Eosinophils Relative: 4.3 % (ref 0.0–5.0)
HCT: 36.6 % (ref 36.0–46.0)
Hemoglobin: 12.5 g/dL (ref 12.0–15.0)
Lymphocytes Relative: 25.4 % (ref 12.0–46.0)
Lymphs Abs: 1.5 10*3/uL (ref 0.7–4.0)
MCHC: 34.2 g/dL (ref 30.0–36.0)
MCV: 90.1 fl (ref 78.0–100.0)
Monocytes Absolute: 0.5 10*3/uL (ref 0.1–1.0)
Monocytes Relative: 8.3 % (ref 3.0–12.0)
Neutro Abs: 3.7 10*3/uL (ref 1.4–7.7)
Neutrophils Relative %: 61.3 % (ref 43.0–77.0)
Platelets: 240 10*3/uL (ref 150.0–400.0)
RBC: 4.06 Mil/uL (ref 3.87–5.11)
RDW: 14.3 % (ref 11.5–15.5)
WBC: 6.1 10*3/uL (ref 4.0–10.5)

## 2018-11-02 LAB — HEPATIC FUNCTION PANEL
ALT: 11 U/L (ref 0–35)
AST: 15 U/L (ref 0–37)
Albumin: 4 g/dL (ref 3.5–5.2)
Alkaline Phosphatase: 38 U/L — ABNORMAL LOW (ref 39–117)
Bilirubin, Direct: 0.1 mg/dL (ref 0.0–0.3)
Total Bilirubin: 0.6 mg/dL (ref 0.2–1.2)
Total Protein: 6.3 g/dL (ref 6.0–8.3)

## 2018-11-02 LAB — TSH: TSH: 0.74 u[IU]/mL (ref 0.35–4.50)

## 2018-11-02 LAB — LIPID PANEL
Cholesterol: 163 mg/dL (ref 0–200)
HDL: 49.3 mg/dL (ref >39.00)
LDL Cholesterol: 90 mg/dL (ref 0–99)
NonHDL: 113.36
Total CHOL/HDL Ratio: 3
Triglycerides: 116 mg/dL (ref 0.0–149.0)
VLDL: 23.2 mg/dL (ref 0.0–40.0)

## 2018-11-02 LAB — BASIC METABOLIC PANEL
BUN: 16 mg/dL (ref 6–23)
CO2: 29 mEq/L (ref 19–32)
Calcium: 9.7 mg/dL (ref 8.4–10.5)
Chloride: 104 mEq/L (ref 96–112)
Creatinine, Ser: 0.7 mg/dL (ref 0.40–1.20)
GFR: 85.4 mL/min (ref >60.00)
Glucose, Bld: 104 mg/dL — ABNORMAL HIGH (ref 70–99)
Potassium: 4 mEq/L (ref 3.5–5.1)
Sodium: 142 mEq/L (ref 135–145)

## 2018-11-02 NOTE — Assessment & Plan Note (Signed)
Chronic problem.  Currently asymptomatic.  Check labs.  Adjust meds prn  

## 2018-11-02 NOTE — Patient Instructions (Signed)
Follow up in 6 months to recheck cholesterol and thyroid We'll notify you of your lab results and make any changes if needed Continue to make healthy food choices and exercise as you are able Call with any questions or concerns Happy Holidays!!!

## 2018-11-02 NOTE — Progress Notes (Signed)
   Subjective:    Patient ID: Tasha Moss, female    DOB: 01-01-1936, 82 y.o.   MRN: [id number]  HPI CPE- pt refuses mammo.  No need for colonoscopy.  UTD on immunizations.   Review of Systems Patient reports no vision/ hearing changes, adenopathy,fever, weight change,  persistant/recurrent hoarseness , swallowing issues, chest pain, palpitations, edema, persistant/recurrent cough, hemoptysis, dyspnea (rest/exertional/paroxysmal nocturnal), gastrointestinal bleeding (melena, rectal bleeding), abdominal pain, significant heartburn, bowel changes, GU symptoms (dysuria, hematuria, incontinence), Gyn symptoms (abnormal  bleeding, pain),  syncope, focal weakness, memory loss, numbness & tingling, skin/hair/nail changes, abnormal bruising or bleeding, anxiety, or depression.     Objective:   Physical Exam General Appearance:    Alert, cooperative, no distress, appears stated age, obese  Head:    Normocephalic, without obvious abnormality, atraumatic  Eyes:    PERRL, conjunctiva/corneas clear, EOM's intact, fundi    benign, both eyes  Ears:    Normal TM's and external ear canals, both ears  Nose:   Nares normal, septum midline, mucosa normal, no drainage    or sinus tenderness  Throat:   Lips, mucosa, and tongue normal; teeth and gums normal  Neck:   Supple, symmetrical, trachea midline, no adenopathy;    Thyroid: no enlargement/tenderness/nodules  Back:     Symmetric, no curvature, ROM normal, no CVA tenderness  Lungs:     Clear to auscultation bilaterally, respirations unlabored  Chest Wall:    No tenderness or deformity   Heart:    Regular rate and rhythm, S1 and S2 normal, no murmur, rub   or gallop  Breast Exam:    Deferred  Abdomen:     Soft, non-tender, bowel sounds active all four quadrants,    no masses, no organomegaly  Genitalia:    Deferred  Rectal:    Extremities:   Extremities normal, atraumatic, no cyanosis or edema  Pulses:   2+ and symmetric all extremities  Skin:   Skin  color, texture, turgor normal, no rashes or lesions  Lymph nodes:   Cervical, supraclavicular, and axillary nodes normal  Neurologic:   CNII-XII intact, normal strength, sensation and reflexes    throughout          Assessment & Plan:

## 2018-11-02 NOTE — Assessment & Plan Note (Signed)
Chronic problem.  Tolerating statin w/o difficulty.  Check labs.  Adjust meds prn  

## 2018-11-02 NOTE — Assessment & Plan Note (Addendum)
Pt's PE WNL.  Refusing mammo, colonoscopy.  UTD on immunizations.  Check labs.  Anticipatory guidance provided.

## 2018-12-12 ENCOUNTER — Other Ambulatory Visit: Payer: Self-pay | Admitting: Family Medicine

## 2019-01-03 ENCOUNTER — Other Ambulatory Visit: Payer: Self-pay | Admitting: Family Medicine

## 2019-01-14 ENCOUNTER — Other Ambulatory Visit: Payer: Self-pay | Admitting: Family Medicine

## 2019-02-17 ENCOUNTER — Other Ambulatory Visit: Payer: Self-pay | Admitting: Family Medicine

## 2019-03-07 ENCOUNTER — Other Ambulatory Visit: Payer: Self-pay | Admitting: Family Medicine

## 2019-04-02 ENCOUNTER — Other Ambulatory Visit: Payer: Self-pay | Admitting: Family Medicine

## 2019-04-12 ENCOUNTER — Other Ambulatory Visit: Payer: Self-pay | Admitting: Family Medicine

## 2019-04-19 ENCOUNTER — Other Ambulatory Visit: Payer: Self-pay

## 2019-04-19 ENCOUNTER — Ambulatory Visit (INDEPENDENT_AMBULATORY_CARE_PROVIDER_SITE_OTHER): Payer: Medicare Other | Admitting: Otolaryngology

## 2019-04-19 DIAGNOSIS — H6123 Impacted cerumen, bilateral: Secondary | ICD-10-CM | POA: Diagnosis not present

## 2019-05-04 ENCOUNTER — Ambulatory Visit: Payer: Medicare Other | Admitting: Family Medicine

## 2019-05-05 ENCOUNTER — Encounter: Payer: Self-pay | Admitting: Family Medicine

## 2019-05-05 ENCOUNTER — Other Ambulatory Visit: Payer: Self-pay

## 2019-05-05 ENCOUNTER — Ambulatory Visit (INDEPENDENT_AMBULATORY_CARE_PROVIDER_SITE_OTHER): Payer: Medicare Other | Admitting: Family Medicine

## 2019-05-05 ENCOUNTER — Ambulatory Visit (INDEPENDENT_AMBULATORY_CARE_PROVIDER_SITE_OTHER): Payer: Medicare Other

## 2019-05-05 VITALS — BP 165/65 | Temp 98.5°F | Ht 66.0 in | Wt 170.0 lb

## 2019-05-05 DIAGNOSIS — R030 Elevated blood-pressure reading, without diagnosis of hypertension: Secondary | ICD-10-CM

## 2019-05-05 DIAGNOSIS — E782 Mixed hyperlipidemia: Secondary | ICD-10-CM | POA: Diagnosis not present

## 2019-05-05 DIAGNOSIS — E039 Hypothyroidism, unspecified: Secondary | ICD-10-CM

## 2019-05-05 LAB — LIPID PANEL
Cholesterol: 155 mg/dL (ref 0–200)
HDL: 46.5 mg/dL (ref >39.00)
LDL Cholesterol: 86 mg/dL (ref 0–99)
NonHDL: 108.85
Total CHOL/HDL Ratio: 3
Triglycerides: 116 mg/dL (ref 0.0–149.0)
VLDL: 23.2 mg/dL (ref 0.0–40.0)

## 2019-05-05 LAB — CBC WITH DIFFERENTIAL/PLATELET
Basophils Absolute: 0 10*3/uL (ref 0.0–0.1)
Basophils Relative: 0.5 % (ref 0.0–3.0)
Eosinophils Absolute: 0.3 10*3/uL (ref 0.0–0.7)
Eosinophils Relative: 4.2 % (ref 0.0–5.0)
HCT: 35.2 % — ABNORMAL LOW (ref 36.0–46.0)
Hemoglobin: 12.3 g/dL (ref 12.0–15.0)
Lymphocytes Relative: 28.2 % (ref 12.0–46.0)
Lymphs Abs: 1.8 10*3/uL (ref 0.7–4.0)
MCHC: 34.9 g/dL (ref 30.0–36.0)
MCV: 89.2 fl (ref 78.0–100.0)
Monocytes Absolute: 0.6 10*3/uL (ref 0.1–1.0)
Monocytes Relative: 9.8 % (ref 3.0–12.0)
Neutro Abs: 3.7 10*3/uL (ref 1.4–7.7)
Neutrophils Relative %: 57.3 % (ref 43.0–77.0)
Platelets: 231 10*3/uL (ref 150.0–400.0)
RBC: 3.95 Mil/uL (ref 3.87–5.11)
RDW: 14.4 % (ref 11.5–15.5)
WBC: 6.5 10*3/uL (ref 4.0–10.5)

## 2019-05-05 LAB — BASIC METABOLIC PANEL
BUN: 15 mg/dL (ref 6–23)
CO2: 30 mEq/L (ref 19–32)
Calcium: 9.2 mg/dL (ref 8.4–10.5)
Chloride: 104 mEq/L (ref 96–112)
Creatinine, Ser: 0.63 mg/dL (ref 0.40–1.20)
GFR: 90.63 mL/min (ref >60.00)
Glucose, Bld: 86 mg/dL (ref 70–99)
Potassium: 3.9 mEq/L (ref 3.5–5.1)
Sodium: 142 mEq/L (ref 135–145)

## 2019-05-05 LAB — HEPATIC FUNCTION PANEL
ALT: 9 U/L (ref 0–35)
AST: 13 U/L (ref 0–37)
Albumin: 3.7 g/dL (ref 3.5–5.2)
Alkaline Phosphatase: 41 U/L (ref 39–117)
Bilirubin, Direct: 0.1 mg/dL (ref 0.0–0.3)
Total Bilirubin: 0.5 mg/dL (ref 0.2–1.2)
Total Protein: 6.1 g/dL (ref 6.0–8.3)

## 2019-05-05 LAB — TSH: TSH: 0.79 u[IU]/mL (ref 0.35–4.50)

## 2019-05-05 NOTE — Progress Notes (Signed)
Virtual Visit via Video   I connected with patient on 05/05/19 at 11:00 AM EDT by a video enabled telemedicine application and verified that I am speaking with the correct person using two identifiers.  Location patient: Home Location provider: [facility], Office Persons participating in the virtual visit: Patient, Provider, Corinne (Jess B)  I discussed the limitations of evaluation and management by telemedicine and the availability of in person appointments. The patient expressed understanding and agreed to proceed.  Subjective:   HPI:   HTN- pt has been normotensive w/o medication for quite some time.  Pt feels that home cuff is not working correctly.  No CP, SOB, HAs, visual changes, edema.  Hyperlipidemia- chronic problem, on Fenofibrate 160mg  daily and Pravastatin 40mg  nightly.  Denies abd pain, N/V.  Hypothyroid- chronic problem, on Levothyroxine 117mcg daily.  Denies excessive fatigue, changes to skin/hair/nails.  ROS:   See pertinent positives and negatives per HPI.  Patient Active Problem List   Diagnosis Date Noted  . 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 Depression 03/11/2016  . Cerumen impaction 02/10/2015  . Routine general medical examination at a health care facility 06/29/2014  . H/O thyroid nodule 06/21/2013  . DIVERTICULOSIS, COLON 07/18/2010  . ESOPHAGEAL REFLUX 04/09/2010  . Stricture and stenosis of esophagus 02/21/2010  . DYSPHAGIA 02/09/2010  . DEGENERATIVE JOINT DISEASE 11/22/2009  . BENIGN PAROXYSMAL POSITIONAL VERTIGO 05/15/2009  . SPINAL STENOSIS, LUMBAR 09/28/2008  . UNS ADVRS EFF UNS RX MEDICINAL&BIOLOGICAL SBSTNC 05/30/2008  . PERNICIOUS ANEMIA 03/28/2008  . Hypothyroidism 09/08/2007  . HYPERLIPIDEMIA 09/08/2007  . RESTLESS LEG SYNDROME 09/08/2007  . HASHIMOTO'S THYROIDITIS 02/23/2007    Social History   Tobacco Use  . Smoking status: Never Smoker  . Smokeless tobacco: Never Used  Substance Use  Topics  . Alcohol use: No    Current Outpatient Medications:  .  acetaminophen (TYLENOL) 500 MG tablet, Take 500 mg by mouth every 6 (six) hours as needed., Disp: , Rfl:  .  Cholecalciferol (VITAMIN D3) 1000 UNITS CAPS, Take 1,000 Units by mouth daily.  , Disp: , Rfl:  .  Cranberry 200 MG CAPS, Take 1 capsule by mouth daily., Disp: , Rfl:  .  fenofibrate 160 MG tablet, Take 1 tablet by mouth once daily, Disp: 90 tablet, Rfl: 0 .  FLUoxetine (PROZAC) 10 MG capsule, Take 1 capsule by mouth once daily at bedtime, Disp: 90 capsule, Rfl: 0 .  glucosamine-chondroitin 500-400 MG tablet, Take 1 tablet by mouth daily., Disp: , Rfl:  .  levothyroxine (SYNTHROID, LEVOTHROID) 125 MCG tablet, TAKE 1 TABLET BY MOUTH ONCE DAILY BEFORE BREAKFAST, Disp: 90 tablet, Rfl: 0 .  loratadine (CLARITIN) 10 MG tablet, Take 10 mg by mouth daily.  , Disp: , Rfl:  .  meloxicam (MOBIC) 7.5 MG tablet, Take 1 tablet by mouth twice daily, Disp: 180 tablet, Rfl: 0 .  Multiple Vitamin (MULTIVITAMIN) tablet, Take 1 tablet by mouth daily.  , Disp: , Rfl:  .  NON FORMULARY, Take 1 capsule by mouth as needed., Disp: , Rfl:  .  ondansetron (ZOFRAN) 4 MG tablet, Take 4 mg by mouth every 6 (six) hours as needed (PONV/GERD)., Disp: , Rfl:  .  pantoprazole (PROTONIX) 40 MG tablet, Take 1 tablet by mouth once daily, Disp: 90 tablet, Rfl: 0 .  pravastatin (PRAVACHOL) 40 MG tablet, TAKE 1 TABLET BY MOUTH ONCE DAILY AT BEDTIME FOR CHOLESTEROL, Disp: 90 tablet, Rfl: 0 .  vitamin B-12 (CYANOCOBALAMIN) 250 MCG tablet,  Take 250 mcg by mouth daily.  , Disp: , Rfl:   Allergies  Allergen Reactions  . Gabapentin     hallucinations  . Other     Strong pain medications cause n/v  . Morphine     Nausea & vomiting    Objective:   BP (!) 165/65   Temp 98.5 F (36.9 C) (Oral)   Ht 5\' 6"  (1.676 m)   Wt 170 lb (77.1 kg)   BMI 27.44 kg/m   AAOx3, NAD NCAT, EOMI No obvious CN deficits Coloring WNL Pt is able to speak clearly, coherently  without shortness of breath or increased work of breathing.  Thought process is linear.  Mood is appropriate.   Assessment and Plan:   Hypothyroid- chronic problem.  Currently asymptomatic.  Check labs.  Adjust meds prn   Hyperlipidemia- chronic problem.  Tolerating statin w/o difficulty.  Check labs.  Adjust meds prn   Elevated BP- pt has not had difficulty w/ elevated BP before.  Suspect that her home cuff is not accurate.  Will check BP when she comes for labs this afternoon.  Pt expressed understanding and is in agreement w/ plan.    Annye Asa, MD 05/05/2019

## 2019-05-05 NOTE — Progress Notes (Signed)
I have discussed the procedure for the virtual visit with the patient who has given consent to proceed with assessment and treatment.   Jessica L Brodmerkel, CMA     

## 2019-05-06 ENCOUNTER — Encounter: Payer: Self-pay | Admitting: General Practice

## 2019-05-24 ENCOUNTER — Other Ambulatory Visit: Payer: Self-pay | Admitting: Family Medicine

## 2019-05-31 ENCOUNTER — Other Ambulatory Visit: Payer: Self-pay | Admitting: Family Medicine

## 2019-06-02 ENCOUNTER — Other Ambulatory Visit: Payer: Self-pay | Admitting: Family Medicine

## 2019-06-14 ENCOUNTER — Other Ambulatory Visit: Payer: Self-pay | Admitting: Family Medicine

## 2019-07-04 ENCOUNTER — Other Ambulatory Visit: Payer: Self-pay | Admitting: Family Medicine

## 2019-07-09 ENCOUNTER — Other Ambulatory Visit: Payer: Self-pay | Admitting: Family Medicine

## 2019-07-20 ENCOUNTER — Telehealth: Payer: Self-pay | Admitting: Family Medicine

## 2019-07-20 NOTE — Telephone Encounter (Signed)
Pt due her CPE after 11/03/19

## 2019-07-20 NOTE — Telephone Encounter (Signed)
Pt's husband wanted to know when she needed to F/up with tabori

## 2019-08-09 ENCOUNTER — Ambulatory Visit (INDEPENDENT_AMBULATORY_CARE_PROVIDER_SITE_OTHER): Payer: Medicare Other | Admitting: Otolaryngology

## 2019-08-09 DIAGNOSIS — H6123 Impacted cerumen, bilateral: Secondary | ICD-10-CM | POA: Diagnosis not present

## 2019-08-13 DIAGNOSIS — H18423 Band keratopathy, bilateral: Secondary | ICD-10-CM | POA: Diagnosis not present

## 2019-08-13 DIAGNOSIS — H04123 Dry eye syndrome of bilateral lacrimal glands: Secondary | ICD-10-CM | POA: Diagnosis not present

## 2019-08-13 DIAGNOSIS — T1501XA Foreign body in cornea, right eye, initial encounter: Secondary | ICD-10-CM | POA: Diagnosis not present

## 2019-08-18 ENCOUNTER — Other Ambulatory Visit: Payer: Self-pay | Admitting: Family Medicine

## 2019-08-23 ENCOUNTER — Other Ambulatory Visit: Payer: Self-pay | Admitting: Family Medicine

## 2019-09-03 ENCOUNTER — Other Ambulatory Visit: Payer: Self-pay | Admitting: Family Medicine

## 2019-09-06 ENCOUNTER — Other Ambulatory Visit: Payer: Self-pay | Admitting: Family Medicine

## 2019-09-06 ENCOUNTER — Ambulatory Visit (INDEPENDENT_AMBULATORY_CARE_PROVIDER_SITE_OTHER): Payer: Medicare Other

## 2019-09-06 DIAGNOSIS — Z23 Encounter for immunization: Secondary | ICD-10-CM

## 2019-09-14 DIAGNOSIS — H31003 Unspecified chorioretinal scars, bilateral: Secondary | ICD-10-CM | POA: Diagnosis not present

## 2019-09-14 DIAGNOSIS — H18423 Band keratopathy, bilateral: Secondary | ICD-10-CM | POA: Diagnosis not present

## 2019-09-14 DIAGNOSIS — H04123 Dry eye syndrome of bilateral lacrimal glands: Secondary | ICD-10-CM | POA: Diagnosis not present

## 2019-09-14 DIAGNOSIS — H524 Presbyopia: Secondary | ICD-10-CM | POA: Diagnosis not present

## 2019-09-15 ENCOUNTER — Ambulatory Visit: Payer: Medicare Other

## 2019-10-03 ENCOUNTER — Other Ambulatory Visit: Payer: Self-pay | Admitting: Family Medicine

## 2019-10-06 ENCOUNTER — Other Ambulatory Visit: Payer: Self-pay | Admitting: Family Medicine

## 2019-10-21 ENCOUNTER — Ambulatory Visit (INDEPENDENT_AMBULATORY_CARE_PROVIDER_SITE_OTHER): Payer: Medicare Other | Admitting: Otolaryngology

## 2019-10-21 ENCOUNTER — Other Ambulatory Visit: Payer: Self-pay

## 2019-11-10 ENCOUNTER — Encounter: Payer: Self-pay | Admitting: Family Medicine

## 2019-11-10 ENCOUNTER — Ambulatory Visit (INDEPENDENT_AMBULATORY_CARE_PROVIDER_SITE_OTHER): Payer: Medicare Other | Admitting: Family Medicine

## 2019-11-10 ENCOUNTER — Other Ambulatory Visit: Payer: Self-pay

## 2019-11-10 ENCOUNTER — Ambulatory Visit (INDEPENDENT_AMBULATORY_CARE_PROVIDER_SITE_OTHER): Payer: Medicare Other

## 2019-11-10 VITALS — BP 126/74 | HR 74 | Temp 98.1°F | Ht 66.0 in | Wt 165.2 lb

## 2019-11-10 DIAGNOSIS — Z0000 Encounter for general adult medical examination without abnormal findings: Secondary | ICD-10-CM

## 2019-11-10 DIAGNOSIS — E782 Mixed hyperlipidemia: Secondary | ICD-10-CM | POA: Diagnosis not present

## 2019-11-10 NOTE — Progress Notes (Addendum)
Subjective:   Tasha Moss is a 83 y.o. female who presents for Medicare Annual (Subsequent) preventive examination.  Review of Systems:   Cardiac Risk Factors include: advanced age (>28men, >78 women);dyslipidemia    Objective:     Vitals: BP 126/74 (BP Location: Left Arm, Patient Position: Sitting, Cuff Size: Normal)   Pulse 74   Temp 98.1 F (36.7 C) (Oral)   Ht 5\' 6"  (1.676 m)   Wt 165 lb 3.2 oz (74.9 kg)   SpO2 92%   BMI 26.66 kg/m   Body mass index is 26.66 kg/m.  Advanced Directives 11/10/2019 10/09/2017 03/08/2016 03/01/2016 02/19/2016 02/10/2016 03/15/2015  Does Patient Have a Medical Advance Directive? Yes Yes Yes Yes Yes Yes Yes  Type of Advance Directive Living will;Healthcare Power of [place];Living will Healthcare Power of [place];Living will [place];Living will Living will;Healthcare Power of Attorney Living will;Healthcare Power of Attorney  Does patient want to make changes to medical advance directive? No - Patient declined - No - Patient declined No - Patient declined No - Patient declined - No - Patient declined  Copy of [place] in Chart? Yes - validated most recent copy scanned in chart (See row information) Yes No - copy requested No - copy requested No - copy requested - No - copy requested    Tobacco Social History   Tobacco Use  Smoking Status Never Smoker  Smokeless Tobacco Never Used     Counseling given: Yes   Clinical Intake:  Pre-visit preparation completed: Yes  Pain : No/denies pain  Diabetes: No  How often do you need to have someone help you when you read instructions, pamphlets, or other written materials from your doctor or pharmacy?: 1 - Never  Interpreter Needed?: No  Information entered by :: Denman George LPN  Past Medical History:  Diagnosis Date  . Allergy    seasonal & dust  . Anxiety   . Arthritis    DJD  . Family  history of adverse reaction to anesthesia    n/v   . GERD (gastroesophageal reflux disease)   . History of vertigo   . Hyperlipidemia   . Hypertension   . Hypothyroidism   . PONV (postoperative nausea and vomiting)   . Thyroid disease    hypothyroidism post Hashimoto's & partial thyroidectomy   Past Surgical History:  Procedure Laterality Date  . CATARACT EXTRACTION W/ INTRAOCULAR LENS  IMPLANT, BILATERAL    . COLONOSCOPY     diverticulosis  . [place]   THR bilaterally   . lumbar disc repair  1989   Dr Arrie Aran  . SPINE SURGERY     cervical ; Dr Vertell Limber  . THYROIDECTOMY, PARTIAL     benign nodule  . TONSILLECTOMY AND ADENOIDECTOMY    . TOTAL SHOULDER ARTHROPLASTY Left 02/27/2016   Procedure: LEFT REVERSE TOTAL SHOULDER ARTHROPLASTY;  Surgeon: Renette Butters, MD;  Location: [facility];  Service: Orthopedics;  Laterality: Left;  . vitreous detachment     Dr Claudean Kinds   Family History  Problem Relation Age of Onset  . Diabetes Mother   . Hepatitis Mother        Hepatitis B,? etiology  . Miscarriages / [place] Mother   . Stroke Mother 79  . Heart disease Father        CHF  . Cancer Paternal Aunt  GI cancers  . Heart disease Paternal Aunt        CHF  . Cancer Paternal Uncle        lung, GI  . Heart disease Paternal Uncle        CHF   Social History   Socioeconomic History  . Marital status: Married    Spouse name: Not on file  . Number of children: 2  . Years of education: Not on file  . Highest education level: Not on file  Occupational History  . Not on file  Social Needs  . Financial resource strain: Not on file  . Food insecurity    Worry: Not on file    Inability: Not on file  . Transportation needs    Medical: Not on file    Non-medical: Not on file  Tobacco Use  . Smoking status: Never Smoker  . Smokeless tobacco: Never Used  Substance and Sexual Activity  . Alcohol use: No  . Drug use: No  . Sexual activity:  Not on file  Lifestyle  . Physical activity    Days per week: Not on file    Minutes per session: Not on file  . Stress: Not on file  Relationships  . Social Herbalist on phone: Not on file    Gets together: Not on file    Attends religious service: Not on file    Active member of club or organization: Not on file    Attends meetings of clubs or organizations: Not on file    Relationship status: Not on file  Other Topics Concern  . Not on file  Social History Narrative   Married for 12 years    Daughter-nurse   Daughter- paralegal     Outpatient Encounter Medications as of 11/10/2019  Medication Sig  . acetaminophen (TYLENOL) 500 MG tablet Take 500 mg by mouth every 6 (six) hours as needed.  . Cholecalciferol (VITAMIN D3) 1000 UNITS CAPS Take 1,000 Units by mouth daily.    . Cranberry 200 MG CAPS Take 1 capsule by mouth daily.  Arna Medici 125 MCG tablet TAKE 1 TABLET BY MOUTH ONCE DAILY BEFORE BREAKFAST  . fenofibrate 160 MG tablet Take 1 tablet by mouth once daily  . FLUoxetine (PROZAC) 10 MG capsule Take 1 capsule by mouth once daily at bedtime  . glucosamine-chondroitin 500-400 MG tablet Take 1 tablet by mouth daily.  Marland Kitchen loratadine (CLARITIN) 10 MG tablet Take 10 mg by mouth daily.    . meloxicam (MOBIC) 7.5 MG tablet Take 1 tablet by mouth twice daily  . Multiple Vitamin (MULTIVITAMIN) tablet Take 1 tablet by mouth daily.    . NON FORMULARY Take 1 capsule by mouth as needed.  . ondansetron (ZOFRAN) 4 MG tablet Take 4 mg by mouth every 6 (six) hours as needed (PONV/GERD).  Marland Kitchen pantoprazole (PROTONIX) 40 MG tablet Take 1 tablet by mouth once daily  . pravastatin (PRAVACHOL) 40 MG tablet TAKE 1 TABLET BY MOUTH ONCE DAILY AT BEDTIME FOR CHOLESTEROL  . vitamin B-12 (CYANOCOBALAMIN) 250 MCG tablet Take 250 mcg by mouth daily.     No facility-administered encounter medications on file as of 11/10/2019.     Activities of Daily Living In your present state of health, do  you have any difficulty performing the following activities: 11/10/2019 11/10/2019  Hearing? N Y  Vision? N N  Difficulty concentrating or making decisions? N Y  Walking or climbing stairs? Y Y  Dressing or  bathing? Y Y  Comment due to shoulder immobility -  Doing errands, shopping? Tempie Donning  Preparing Food and eating ? N N  Using the Toilet? N N  In the past six months, have you accidently leaked urine? N N  Do you have problems with loss of bowel control? N N  Managing your Medications? N N  Managing your Finances? N N  Housekeeping or managing your Housekeeping? Y Y  Comment assisted by family/spouse -  Some recent data might be hidden    Patient Care Team: [name], MD as PCP - General (Family Medicine) Inda Castle, MD (Inactive) as Consulting Physician (Gastroenterology) Leta Baptist, MD as Consulting Physician (Otolaryngology) Erline Levine, MD as Consulting Physician (Neurosurgery) Renette Butters, MD as Attending Physician (Orthopedic Surgery) Harriett Sine, MD as Consulting Physician (Dermatology)    Assessment:   This is a routine wellness examination for Aydan.  Exercise Activities and Dietary recommendations Current Exercise Habits: The patient does not participate in regular exercise at present, Exercise limited by: orthopedic condition(s)  Goals    . Patient Stated     Maintain independence         Fall Risk Fall Risk  11/10/2019 11/10/2019 11/10/2019 05/05/2019 11/02/2018  Falls in the past year? 0 0 0 0 0  Number falls in past yr: - 0 0 0 -  Injury with Fall? 0 0 0 0 -  Risk for fall due to : Impaired balance/gait;Impaired mobility - - - -  Follow up Falls prevention discussed;Education provided;Falls evaluation completed Falls evaluation completed Falls evaluation completed - -   Is the patient's home free of loose throw rugs in walkways, pet beds, electrical cords, etc?   yes      Grab bars in the bathroom? yes      Handrails on the stairs?    yes      Adequate lighting?   yes  Timed Get Up and Go performed: completed and within normal timeframe; no gait abnormalities noted, uses walker to aid in ambulation    Depression Screen PHQ 2/9 Scores 11/10/2019 11/10/2019 05/05/2019 11/02/2018  PHQ - 2 Score 0 0 0 0  PHQ- 9 Score - 0 0 0  Exception Documentation - - - -     Cognitive Function- no cognitive concerns at this time Cognitive Testing  Alert? Yes         Normal Appearance? Yes  Oriented to person? Yes           Place? Yes  Time? Yes  Recall of three objects? Yes  Can perform simple calculations? Yes  Displays appropriate judgment? Yes  Can read the correct time from a watch face? Yes   Immunization History  Administered Date(s) Administered  . Fluad Quad(high Dose 65+) 09/06/2019  . Influenza, High Dose Seasonal PF 09/16/2013, 09/13/2015, 10/09/2017, 09/18/2018  . Influenza,inj,Quad PF,6+ Mos 09/20/2014, 09/09/2016  . PPD Test 03/01/2016  . Pneumococcal Conjugate-13 01/03/2015  . Pneumococcal-Unspecified 12/10/2011    Qualifies for Shingles Vaccine?Discussed and patient will check with pharmacy for coverage.  Patient education handout provided   Screening Tests Health Maintenance  Topic Date Due  . MAMMOGRAM  07/09/2016  . TETANUS/TDAP  05/04/2020 (Originally 01/14/1955)  . INFLUENZA VACCINE  Completed  . DEXA SCAN  Completed  . PNA vac Low Risk Adult  Completed    Cancer Screenings: Lung: Low Dose CT Chest recommended if Age 63-80 years, 30 pack-year currently smoking OR have quit w/in 15years. Patient does  not qualify. Breast:  Up to date on Mammogram? Yes   Up to date of Bone Density/Dexa? Yes Colorectal: No longer indicated; last colonoscopy 04/17/10    Plan:  I have personally reviewed and addressed the Medicare Annual Wellness questionnaire and have noted the following in the patient's chart:  A. Medical and social history B. Use of alcohol, tobacco or illicit drugs  C. Current medications and  supplements D. Functional ability and status E.  Nutritional status F.  Physical activity G. Advance directives H. List of other physicians I.  Hospitalizations, surgeries, and ER visits in previous 12 months J.  [place] such as hearing and vision if needed, cognitive and depression L. Referrals, records requested, and appointments- none   In addition, I have reviewed and discussed with patient certain preventive protocols, quality metrics, and best practice recommendations. A written personalized care plan for preventive services as well as general preventive health recommendations were provided to patient.   Signed,  Denman George, LPN  Nurse Health Advisor   Nurse Notes: no additional   Reviewed documentation and agree w/ above.  Annye Asa, MD

## 2019-11-10 NOTE — Progress Notes (Signed)
   Subjective:    Patient ID: Tasha Moss, female    DOB: 04/02/1936, 83 y.o.   MRN: [id number]  HPI CPE- UTD on Pneumonia Vaccines, flu shot.  No longer having colonoscopies, mammograms.   Review of Systems Patient reports no vision/ hearing changes, adenopathy,fever, weight change,  persistant/recurrent hoarseness , swallowing issues, chest pain, palpitations, edema, persistant/recurrent cough, hemoptysis, dyspnea (rest/exertional/paroxysmal nocturnal), gastrointestinal bleeding (melena, rectal bleeding), abdominal pain, significant heartburn, bowel changes, GU symptoms (dysuria, hematuria, incontinence), Gyn symptoms (abnormal  bleeding, pain),  syncope, focal weakness, memory loss, numbness & tingling, skin/hair/nail changes, abnormal bruising or bleeding, anxiety, or depression.   This visit occurred during the SARS-CoV-2 public health emergency.  Safety protocols were in place, including screening questions prior to the visit, additional usage of staff PPE, and extensive cleaning of exam room while observing appropriate contact time as indicated for disinfecting solutions.       Objective:   Physical Exam General Appearance:    Alert, cooperative, no distress, appears stated age  Head:    Normocephalic, without obvious abnormality, atraumatic  Eyes:    PERRL, conjunctiva/corneas clear, EOM's intact, fundi    benign, both eyes  Ears:    Normal TM's and external ear canals, both ears  Nose:   Deferred due to COVID  Throat:   Neck:   Supple, symmetrical, trachea midline, no adenopathy;    Thyroid: no enlargement/tenderness/nodules  Back:     Symmetric, no curvature, ROM normal, no CVA tenderness  Lungs:     Clear to auscultation bilaterally, respirations unlabored  Chest Wall:    No tenderness or deformity   Heart:    Regular rate and rhythm, S1 and S2 normal, no murmur, rub   or gallop  Breast Exam:    Deferred  Abdomen:     Soft, non-tender, bowel sounds active all four  quadrants,    no masses, no organomegaly  Genitalia:    Deferred  Rectal:    Extremities:   Extremities normal, atraumatic, no cyanosis or edema  Pulses:   2+ and symmetric all extremities  Skin:   Skin color, texture, turgor normal, no rashes or lesions  Lymph nodes:   Cervical, supraclavicular, and axillary nodes normal  Neurologic:   CNII-XII intact, normal strength, sensation and reflexes    throughout          Assessment & Plan:

## 2019-11-10 NOTE — Patient Instructions (Signed)
Tasha Moss , Thank you for taking time to come for your Medicare Wellness Visit. I appreciate your ongoing commitment to your health goals. Please review the following plan we discussed and let me know if I can assist you in the future.   Screening recommendations/referrals: Colorectal Screening: No longer indicated; last colonoscopy 04/17/10  Mammogram: up to date; no longer indicated Bone Density: up to date; last 07/11/14  Vision and Dental Exams: Recommended annual ophthalmology exams for early detection of glaucoma and other disorders of the eye Recommended annual dental exams for proper oral hygiene  Vaccinations: Influenza vaccine: completed 09/06/19  Pneumococcal vaccine: up to date; last 01/03/15  Tdap vaccine: recommended; Please call your insurance company to determine your out of pocket expense. You may receive this vaccine at your local pharmacy or Health Dept. Shingles vaccine: Please call your insurance company to determine your out of pocket expense for the Shingrix vaccine. You may receive this vaccine at your local pharmacy.  Advanced directives: We have received a copy of your POA (Power of [place]) and/or Living Will. These documents can be located in your chart.  Goals: Recommend to drink at least 6-8 8oz glasses of water per day and consume a balanced diet rich in fresh fruits and vegetables.   Next appointment: Please schedule your Annual Wellness Visit with your Nurse Health Advisor in one year.  Preventive Care 45 Years and Older, Female Preventive care refers to lifestyle choices and visits with your health care provider that can promote health and wellness. What does preventive care include?  A yearly physical exam. This is also called an annual well check.  Dental exams once or twice a year.  Routine eye exams. Ask your health care provider how often you should have your eyes checked.  Personal lifestyle choices, including:  Daily care of your teeth and  gums.  Regular physical activity.  Eating a healthy diet.  Avoiding tobacco and drug use.  Limiting alcohol use.  Practicing safe sex.  Taking low-dose aspirin every day if recommended by your health care provider.  Taking vitamin and mineral supplements as recommended by your health care provider. What happens during an annual well check? The services and screenings done by your health care provider during your annual well check will depend on your age, overall health, lifestyle risk factors, and family history of disease. Counseling  Your health care provider may ask you questions about your:  Alcohol use.  Tobacco use.  Drug use.  Emotional well-being.  Home and relationship well-being.  Sexual activity.  Eating habits.  History of falls.  Memory and ability to understand (cognition).  Work and work Statistician.  Reproductive health. Screening  You may have the following tests or measurements:  Height, weight, and BMI.  Blood pressure.  Lipid and cholesterol levels. These may be checked every 5 years, or more frequently if you are over 83 years old.  Skin check.  Lung cancer screening. You may have this screening every year starting at age 3 if you have a 30-pack-year history of smoking and currently smoke or have quit within the past 15 years.  Fecal occult blood test (FOBT) of the stool. You may have this test every year starting at age 30.  Flexible sigmoidoscopy or colonoscopy. You may have a sigmoidoscopy every 5 years or a colonoscopy every 10 years starting at age 4.  Hepatitis C blood test.  Hepatitis B blood test.  Sexually transmitted disease (STD) testing.  Diabetes screening. This is  done by checking your blood sugar (glucose) after you have not eaten for a while (fasting). You may have this done every 1-3 years.  Bone density scan. This is done to screen for osteoporosis. You may have this done starting at age 84.  Mammogram. This  may be done every 1-2 years. Talk to your health care provider about how often you should have regular mammograms. Talk with your health care provider about your test results, treatment options, and if necessary, the need for more tests. Vaccines  Your health care provider may recommend certain vaccines, such as:  Influenza vaccine. This is recommended every year.  Tetanus, diphtheria, and acellular pertussis (Tdap, Td) vaccine. You may need a Td booster every 10 years.  Zoster vaccine. You may need this after age 79.  Pneumococcal 13-valent conjugate (PCV13) vaccine. One dose is recommended after age 24.  Pneumococcal polysaccharide (PPSV23) vaccine. One dose is recommended after age 74. Talk to your health care provider about which screenings and vaccines you need and how often you need them. This information is not intended to replace advice given to you by your health care provider. Make sure you discuss any questions you have with your health care provider. Document Released: 12/22/2015 Document Revised: 08/14/2016 Document Reviewed: 09/26/2015 Elsevier Interactive Patient Education  2017 [employer] Prevention in the Home Falls can cause injuries. They can happen to people of all ages. There are many things you can do to make your home safe and to help prevent falls. What can I do on the outside of my home?  Regularly fix the edges of walkways and driveways and fix any cracks.  Remove anything that might make you trip as you walk through a door, such as a raised step or threshold.  Trim any bushes or trees on the path to your home.  Use bright outdoor lighting.  Clear any walking paths of anything that might make someone trip, such as rocks or tools.  Regularly check to see if handrails are loose or broken. Make sure that both sides of any steps have handrails.  Any raised decks and porches should have guardrails on the edges.  Have any leaves, snow, or ice cleared  regularly.  Use sand or salt on walking paths during winter.  Clean up any spills in your garage right away. This includes oil or grease spills. What can I do in the bathroom?  Use night lights.  Install grab bars by the toilet and in the tub and shower. Do not use towel bars as grab bars.  Use non-skid mats or decals in the tub or shower.  If you need to sit down in the shower, use a plastic, non-slip stool.  Keep the floor dry. Clean up any water that spills on the floor as soon as it happens.  Remove soap buildup in the tub or shower regularly.  Attach bath mats securely with double-sided non-slip rug tape.  Do not have throw rugs and other things on the floor that can make you trip. What can I do in the bedroom?  Use night lights.  Make sure that you have a light by your bed that is easy to reach.  Do not use any sheets or blankets that are too big for your bed. They should not hang down onto the floor.  Have a firm chair that has side arms. You can use this for support while you get dressed.  Do not have throw rugs and other things  on the floor that can make you trip. What can I do in the kitchen?  Clean up any spills right away.  Avoid walking on wet floors.  Keep items that you use a lot in easy-to-reach places.  If you need to reach something above you, use a strong step stool that has a grab bar.  Keep electrical cords out of the way.  Do not use floor polish or wax that makes floors slippery. If you must use wax, use non-skid floor wax.  Do not have throw rugs and other things on the floor that can make you trip. What can I do with my stairs?  Do not leave any items on the stairs.  Make sure that there are handrails on both sides of the stairs and use them. Fix handrails that are broken or loose. Make sure that handrails are as long as the stairways.  Check any carpeting to make sure that it is firmly attached to the stairs. Fix any carpet that is loose  or worn.  Avoid having throw rugs at the top or bottom of the stairs. If you do have throw rugs, attach them to the floor with carpet tape.  Make sure that you have a light switch at the top of the stairs and the bottom of the stairs. If you do not have them, ask someone to add them for you. What else can I do to help prevent falls?  Wear shoes that:  Do not have high heels.  Have rubber bottoms.  Are comfortable and fit you well.  Are closed at the toe. Do not wear sandals.  If you use a stepladder:  Make sure that it is fully opened. Do not climb a closed stepladder.  Make sure that both sides of the stepladder are locked into place.  Ask someone to hold it for you, if possible.  Clearly mark and make sure that you can see:  Any grab bars or handrails.  First and last steps.  Where the edge of each step is.  Use tools that help you move around (mobility aids) if they are needed. These include:  Canes.  Walkers.  Scooters.  Crutches.  Turn on the lights when you go into a dark area. Replace any light bulbs as soon as they burn out.  Set up your furniture so you have a clear path. Avoid moving your furniture around.  If any of your floors are uneven, fix them.  If there are any pets around you, be aware of where they are.  Review your medicines with your doctor. Some medicines can make you feel dizzy. This can increase your chance of falling. Ask your doctor what other things that you can do to help prevent falls. This information is not intended to replace advice given to you by your health care provider. Make sure you discuss any questions you have with your health care provider. Document Released: 09/21/2009 Document Revised: 05/02/2016 Document Reviewed: 12/30/2014 Elsevier Interactive Patient Education  2017 [employer].

## 2019-11-10 NOTE — Assessment & Plan Note (Signed)
Pt's PE unchanged from previous.  No longer having colonoscopy or mammogram.  UTD on immunizations.  Check labs.  Anticipatory guidance provided.

## 2019-11-10 NOTE — Patient Instructions (Signed)
Follow up in 6 months to recheck cholesterol We'll notify you of your lab results and make any changes if needed Continue to work on healthy diet- you can do it! Call with any questions or concerns Stay Safe!  Stay Healthy! Happy Holidays!

## 2019-11-10 NOTE — Assessment & Plan Note (Signed)
Chronic problem, tolerating statin w/o difficulty.  Check labs.  Adjust meds prn  

## 2019-11-13 LAB — CBC WITH DIFFERENTIAL/PLATELET
Absolute Monocytes: 499 cells/uL (ref 200–950)
Basophils Absolute: 31 cells/uL (ref 0–200)
Basophils Relative: 0.6 %
Eosinophils Absolute: 229 cells/uL (ref 15–500)
Eosinophils Relative: 4.4 %
HCT: 36.1 % (ref 35.0–45.0)
Hemoglobin: 12.2 g/dL (ref 11.7–15.5)
Lymphs Abs: 1134 cells/uL (ref 850–3900)
MCH: 30.3 pg (ref 27.0–33.0)
MCHC: 33.8 g/dL (ref 32.0–36.0)
MCV: 89.8 fL (ref 80.0–100.0)
MPV: 11.4 fL (ref 7.5–12.5)
Monocytes Relative: 9.6 %
Neutro Abs: 3307 cells/uL (ref 1500–7800)
Neutrophils Relative %: 63.6 %
Platelets: 240 10*3/uL (ref 140–400)
RBC: 4.02 10*6/uL (ref 3.80–5.10)
RDW: 13.1 % (ref 11.0–15.0)
Total Lymphocyte: 21.8 %
WBC: 5.2 10*3/uL (ref 3.8–10.8)

## 2019-11-13 LAB — BASIC METABOLIC PANEL
BUN/Creatinine Ratio: 34 (calc) — ABNORMAL HIGH (ref 6–22)
BUN: 19 mg/dL (ref 7–25)
CO2: 27 mmol/L (ref 20–32)
Calcium: 9.7 mg/dL (ref 8.6–10.4)
Chloride: 106 mmol/L (ref 98–110)
Creat: 0.56 mg/dL — ABNORMAL LOW (ref 0.60–0.88)
Glucose, Bld: 98 mg/dL (ref 65–99)
Potassium: 3.9 mmol/L (ref 3.5–5.3)
Sodium: 143 mmol/L (ref 135–146)

## 2019-11-13 LAB — LIPID PANEL
Cholesterol: 152 mg/dL (ref <200)
HDL: 48 mg/dL — ABNORMAL LOW (ref >=50)
LDL Cholesterol (Calc): 87 mg/dL (calc)
Non-HDL Cholesterol (Calc): 104 mg/dL (calc) (ref <130)
Total CHOL/HDL Ratio: 3.2 (calc) (ref <5.0)
Triglycerides: 76 mg/dL (ref <150)

## 2019-11-13 LAB — HEPATIC FUNCTION PANEL
AG Ratio: 1.6 (calc) (ref 1.0–2.5)
ALT: 9 U/L (ref 6–29)
AST: 16 U/L (ref 10–35)
Albumin: 3.9 g/dL (ref 3.6–5.1)
Alkaline phosphatase (APISO): 39 U/L (ref 37–153)
Bilirubin, Direct: 0.1 mg/dL (ref 0.0–0.2)
Globulin: 2.4 g/dL (calc) (ref 1.9–3.7)
Indirect Bilirubin: 0.5 mg/dL (calc) (ref 0.2–1.2)
Total Bilirubin: 0.6 mg/dL (ref 0.2–1.2)
Total Protein: 6.3 g/dL (ref 6.1–8.1)

## 2019-11-13 LAB — T4, FREE: Free T4: 2 ng/dL — ABNORMAL HIGH (ref 0.8–1.8)

## 2019-11-13 LAB — T3, FREE: T3, Free: 2.9 pg/mL (ref 2.3–4.2)

## 2019-11-13 LAB — TSH: TSH: 0.28 mIU/L — ABNORMAL LOW (ref 0.40–4.50)

## 2019-11-15 ENCOUNTER — Other Ambulatory Visit: Payer: Self-pay | Admitting: Family Medicine

## 2019-11-15 ENCOUNTER — Telehealth: Payer: Self-pay | Admitting: General Practice

## 2019-11-15 NOTE — Telephone Encounter (Signed)
Pt husband reviewed pt labs and advised that for as long as he can remember and still to this day pt is taking an extra 1/2 tablet of her thyroid medication. He felt that he should inform us of this today.

## 2019-11-15 NOTE — Telephone Encounter (Signed)
Patient notified of PCP recommendations and is agreement and expresses an understanding.   Ok for PEC to Discuss results / PCP recommendations / Schedule patient.   

## 2019-11-15 NOTE — Telephone Encounter (Signed)
Yes- we absolutely need to know the correct dose of what she is taking.  I would have her stop the extra 1/2 tab b/c her TSH is low and her thyroid hormone level is high.

## 2019-11-16 ENCOUNTER — Other Ambulatory Visit: Payer: Self-pay | Admitting: Family Medicine

## 2019-11-16 DIAGNOSIS — E039 Hypothyroidism, unspecified: Secondary | ICD-10-CM

## 2019-11-21 ENCOUNTER — Other Ambulatory Visit: Payer: Self-pay | Admitting: Family Medicine

## 2019-12-04 ENCOUNTER — Other Ambulatory Visit: Payer: Self-pay | Admitting: Family Medicine

## 2019-12-05 ENCOUNTER — Other Ambulatory Visit: Payer: Self-pay | Admitting: Family Medicine

## 2019-12-14 ENCOUNTER — Ambulatory Visit (INDEPENDENT_AMBULATORY_CARE_PROVIDER_SITE_OTHER): Payer: Medicare Other

## 2019-12-14 ENCOUNTER — Other Ambulatory Visit: Payer: Self-pay

## 2019-12-14 DIAGNOSIS — E039 Hypothyroidism, unspecified: Secondary | ICD-10-CM

## 2019-12-14 LAB — TSH: TSH: 0.54 u[IU]/mL (ref 0.35–4.50)

## 2019-12-16 ENCOUNTER — Encounter: Payer: Self-pay | Admitting: General Practice

## 2019-12-20 ENCOUNTER — Other Ambulatory Visit: Payer: Self-pay

## 2019-12-20 ENCOUNTER — Encounter: Payer: Self-pay | Admitting: Family Medicine

## 2019-12-20 ENCOUNTER — Ambulatory Visit (INDEPENDENT_AMBULATORY_CARE_PROVIDER_SITE_OTHER): Payer: Medicare Other | Admitting: Family Medicine

## 2019-12-20 VITALS — BP 138/82 | HR 76 | Temp 97.9°F | Resp 17 | Ht 66.0 in | Wt 162.0 lb

## 2019-12-20 DIAGNOSIS — F419 Anxiety disorder, unspecified: Secondary | ICD-10-CM

## 2019-12-20 DIAGNOSIS — R5383 Other fatigue: Secondary | ICD-10-CM

## 2019-12-20 LAB — POCT URINALYSIS DIPSTICK
Bilirubin, UA: NEGATIVE
Blood, UA: NEGATIVE
Glucose, UA: NEGATIVE
Ketones, UA: NEGATIVE
Leukocytes, UA: NEGATIVE
Nitrite, UA: NEGATIVE
Protein, UA: NEGATIVE
Spec Grav, UA: 1.015 (ref 1.010–1.025)
Urobilinogen, UA: 0.2 E.U./dL
pH, UA: 7 (ref 5.0–8.0)

## 2019-12-20 MED ORDER — FLUOXETINE HCL 20 MG PO CAPS: 20.0000 mg | ORAL_CAPSULE | Freq: Every day | ORAL | 3 refills | Status: DC

## 2019-12-20 NOTE — Patient Instructions (Addendum)
Follow up in 1 month to recheck anxiety We'll notify you of your lab results and make any changes if needed Increase the Fluoxetine to 20mg  (2 tabs of what you have and 1 of the new prescription) If you develop any new or different symptoms, please let me know right away so we can get tested if needed Call with any questions or concerns Stay Safe!  Stay Healthy!

## 2019-12-20 NOTE — Progress Notes (Signed)
   Subjective:    Patient ID: Tasha Moss, female    DOB: [birth year], 84 y.o.   MRN: [id number]  HPI Fatigue- had chills on Saturday.  No fever.  'i'm just right weak'.  Denies burning w/ urination.  Pt reports she is waking throughout the night, 'but I have done that for quite some time'.  Denies dizziness.  Some back soreness.  + HA- 'nearly every day'.  Husband thinks she has worsening 'overall weakness'.  Denies cough, SOB.  Denies congestion, sore throat.  Anxiety- pt reports 'feeling nervous and trembly'.  When asked what she is nervous about- 'what isn't there to be nervous about?'  Is concerned for family.  Has had 5 deaths in the family this year   Review of Systems For ROS see HPI   This visit occurred during the SARS-CoV-2 public health emergency.  Safety protocols were in place, including screening questions prior to the visit, additional usage of staff PPE, and extensive cleaning of exam room while observing appropriate contact time as indicated for disinfecting solutions.       Objective:   Physical Exam Vitals reviewed.  Constitutional:      General: She is not in acute distress.    Appearance: She is well-developed.  HENT:     Head: Normocephalic and atraumatic.  Eyes:     Conjunctiva/sclera: Conjunctivae normal.     Pupils: Pupils are equal, round, and reactive to light.  Neck:     Thyroid: No thyromegaly.  Cardiovascular:     Rate and Rhythm: Normal rate and regular rhythm.     Heart sounds: Normal heart sounds. No murmur.  Pulmonary:     Effort: Pulmonary effort is normal. No respiratory distress.     Breath sounds: Normal breath sounds.  Abdominal:     General: There is no distension.     Palpations: Abdomen is soft.     Tenderness: There is no abdominal tenderness.  Musculoskeletal:     Cervical back: Normal range of motion and neck supple.  Lymphadenopathy:     Cervical: No cervical adenopathy.  Skin:    General: Skin is warm and dry.  Neurological:      Mental Status: She is alert and oriented to person, place, and time.  Psychiatric:        Behavior: Behavior normal.           Assessment & Plan:  Fatigue- new.  Suspect this is multifactorial- age, medical comorbidities, anxiety/depression.  She denies sxs of UTI, UA WNL.  No systemic sxs w/ exception of chills.  Will check CBC, BMP.  Recent TSH WNL.  Told her to watch for any new or different sxs and if appropriate, may need testing for COVID- although likelihood is low at this time.  Feel that most of this is likely due to her constant worry.  Will increase Prozac and monitor for improvement.  Anxiety- deteriorated.  Pt is worried about pretty much everything at this point- COVID, family, politics.  She is having a hard time sleeping.  Will increase Prozac to 20mg  and monitor for improvement.  Pt expressed understanding and is in agreement w/ plan.   Anxiety-

## 2019-12-21 ENCOUNTER — Ambulatory Visit: Payer: Medicare Other | Attending: Internal Medicine

## 2019-12-21 DIAGNOSIS — Z20822 Contact with and (suspected) exposure to covid-19: Secondary | ICD-10-CM

## 2019-12-21 LAB — CBC WITH DIFFERENTIAL/PLATELET
Basophils Absolute: 0 10*3/uL (ref 0.0–0.1)
Basophils Relative: 0.5 % (ref 0.0–3.0)
Eosinophils Absolute: 0.3 10*3/uL (ref 0.0–0.7)
Eosinophils Relative: 5.5 % — ABNORMAL HIGH (ref 0.0–5.0)
HCT: 36.4 % (ref 36.0–46.0)
Hemoglobin: 12.3 g/dL (ref 12.0–15.0)
Lymphocytes Relative: 24.7 % (ref 12.0–46.0)
Lymphs Abs: 1.4 10*3/uL (ref 0.7–4.0)
MCHC: 33.9 g/dL (ref 30.0–36.0)
MCV: 90.3 fl (ref 78.0–100.0)
Monocytes Absolute: 0.5 10*3/uL (ref 0.1–1.0)
Monocytes Relative: 8.9 % (ref 3.0–12.0)
Neutro Abs: 3.5 10*3/uL (ref 1.4–7.7)
Neutrophils Relative %: 60.4 % (ref 43.0–77.0)
Platelets: 241 10*3/uL (ref 150.0–400.0)
RBC: 4.03 Mil/uL (ref 3.87–5.11)
RDW: 15.4 % (ref 11.5–15.5)
WBC: 5.8 10*3/uL (ref 4.0–10.5)

## 2019-12-21 LAB — HEPATIC FUNCTION PANEL
ALT: 9 U/L (ref 0–35)
AST: 15 U/L (ref 0–37)
Albumin: 4 g/dL (ref 3.5–5.2)
Alkaline Phosphatase: 38 U/L — ABNORMAL LOW (ref 39–117)
Bilirubin, Direct: 0.1 mg/dL (ref 0.0–0.3)
Total Bilirubin: 0.6 mg/dL (ref 0.2–1.2)
Total Protein: 6.3 g/dL (ref 6.0–8.3)

## 2019-12-21 LAB — BASIC METABOLIC PANEL
BUN: 14 mg/dL (ref 6–23)
CO2: 29 mEq/L (ref 19–32)
Calcium: 9.5 mg/dL (ref 8.4–10.5)
Chloride: 103 mEq/L (ref 96–112)
Creatinine, Ser: 0.61 mg/dL (ref 0.40–1.20)
GFR: 93.92 mL/min (ref >60.00)
Glucose, Bld: 88 mg/dL (ref 70–99)
Potassium: 3.7 mEq/L (ref 3.5–5.1)
Sodium: 141 mEq/L (ref 135–145)

## 2019-12-22 LAB — NOVEL CORONAVIRUS, NAA: SARS-CoV-2, NAA: NOT DETECTED

## 2019-12-29 ENCOUNTER — Other Ambulatory Visit: Payer: Self-pay | Admitting: Family Medicine

## 2020-01-05 ENCOUNTER — Other Ambulatory Visit: Payer: Self-pay | Admitting: Family Medicine

## 2020-01-05 ENCOUNTER — Encounter: Payer: Self-pay | Admitting: Podiatry

## 2020-01-05 ENCOUNTER — Ambulatory Visit: Payer: Medicare Other | Admitting: Podiatry

## 2020-01-05 ENCOUNTER — Ambulatory Visit (INDEPENDENT_AMBULATORY_CARE_PROVIDER_SITE_OTHER): Payer: Medicare Other | Admitting: Podiatry

## 2020-01-05 ENCOUNTER — Other Ambulatory Visit: Payer: Self-pay

## 2020-01-05 VITALS — BP 183/79

## 2020-01-05 DIAGNOSIS — M79674 Pain in right toe(s): Secondary | ICD-10-CM | POA: Diagnosis not present

## 2020-01-05 DIAGNOSIS — B351 Tinea unguium: Secondary | ICD-10-CM

## 2020-01-05 DIAGNOSIS — M79675 Pain in left toe(s): Secondary | ICD-10-CM | POA: Diagnosis not present

## 2020-01-05 DIAGNOSIS — L600 Ingrowing nail: Secondary | ICD-10-CM

## 2020-01-05 NOTE — Patient Instructions (Signed)

## 2020-01-06 ENCOUNTER — Telehealth: Payer: Self-pay | Admitting: Podiatry

## 2020-01-06 MED ORDER — NEOMYCIN-POLYMYXIN-HC 1 % OT SOLN: OTIC | 1 refills | Status: DC

## 2020-01-06 NOTE — Addendum Note (Signed)
Addended by: Harriett Sine D on: 01/06/2020 01:15 PM   Modules accepted: Orders

## 2020-01-06 NOTE — Telephone Encounter (Signed)
I spoke with pt's husband Eduard Clos and he stated pt could hear me. I explained that Dr. Paulla Dolly had wanted her to have a drop to put on the area of the toe where the toenail was removed after each soaking twice daily and do cover with a fabric bandaid. I told pt and Charlie the drops would state for the ear, but was to be used for the toe due to the antibiotic and the antiinflammatory property, because the chemical applied to the toe nailbed caused a burn to kill the nailbed to stop regrowth. I told Eduard Clos I would send the medication to the [facility].

## 2020-01-06 NOTE — Telephone Encounter (Signed)
Pt husband called to say that the med that Dr.Regal was suppose to send to there phamacy [facility] in [place] they have not received it yet.

## 2020-01-07 NOTE — Progress Notes (Signed)
Subjective:   Patient ID: Tasha Moss, female   DOB: 84 y.o.   MRN: [id number]   HPI Patient states she is had a lot of irritation of her right second nail which has been going on for around a year.  Patient states that she feels the big toenail rubs against the second toenail to get irritated makes it hard for her to wear shoes and she cannot cut it.  Patient does not smoke and likes to be active   Review of Systems  All other systems reviewed and are negative.       Objective:  Physical Exam Vitals and nursing note reviewed.  Constitutional:      Appearance: She is well-developed.  Pulmonary:     Effort: Pulmonary effort is normal.  Musculoskeletal:        General: Normal range of motion.  Skin:    General: Skin is warm.  Neurological:     Mental Status: She is alert.     Neurovascular status found to be intact muscle strength is adequate range of motion within normal limits subtalar midtarsal joint.  Patient is found to have incurvated second nail right medial border with thickness of the underlying nail bed overall but most pain on this medial side with patient noted to have good digital perfusion and well oriented x3     Assessment:  Ingrown toenail deformity second right medial border with pain with pressure of the big toe as part of the pathology     Plan:  H&P reviewed condition with her and caregiver.  I have recommended removal of the nail border and I explained procedure risk and patient wants procedure understanding risk.  At this time I infiltrated the right second toe 60 mg Xylocaine Marcaine mixture sterile prep applied to the toe and using sterile instrumentation I remove the medial border and exposed matrix applied phenol 3 applications 30 seconds followed by alcohol lavage sterile dressing gave instructions on soaks and to leave dressing on 24 hours but take it off earlier if any throbbing should occur.  She will utilize drops and she is encouraged to call  with questions concerns

## 2020-01-19 ENCOUNTER — Encounter: Payer: Self-pay | Admitting: Family Medicine

## 2020-01-19 ENCOUNTER — Other Ambulatory Visit: Payer: Self-pay

## 2020-01-19 ENCOUNTER — Ambulatory Visit (INDEPENDENT_AMBULATORY_CARE_PROVIDER_SITE_OTHER): Payer: Medicare Other | Admitting: Family Medicine

## 2020-01-19 DIAGNOSIS — F419 Anxiety disorder, unspecified: Secondary | ICD-10-CM

## 2020-01-19 NOTE — Progress Notes (Signed)
Virtual Visit via Video   I connected with patient on 01/19/20 at 11:30 AM EST by a video enabled telemedicine application and verified that I am speaking with the correct person using two identifiers.  Location patient: Home Location provider: Acupuncturist, Office Persons participating in the virtual visit: Patient, Provider, [place] (Jess B)  I discussed the limitations of evaluation and management by telemedicine and the availability of in person appointments. The patient expressed understanding and agreed to proceed.  Subjective:   HPI:   Anxiety- pt's Prozac was increased from 10 --> 20mg  at last visit.  Pt feels less 'trembly'- 'nothing like I was having'.  Anxiety is better.  Some improvement in energy level, less fatigued.  Sleeping well.  ROS:   See pertinent positives and negatives per HPI.  Patient Active Problem List   Diagnosis Date Noted  . 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 Depression 03/11/2016  . Cerumen impaction 02/10/2015  . Routine general medical examination at a health care facility 06/29/2014  . H/O thyroid nodule 06/21/2013  . DIVERTICULOSIS, COLON 07/18/2010  . ESOPHAGEAL REFLUX 04/09/2010  . Stricture and stenosis of esophagus 02/21/2010  . DYSPHAGIA 02/09/2010  . DEGENERATIVE JOINT DISEASE 11/22/2009  . BENIGN PAROXYSMAL POSITIONAL VERTIGO 05/15/2009  . SPINAL STENOSIS, LUMBAR 09/28/2008  . UNS ADVRS EFF UNS RX MEDICINAL&BIOLOGICAL SBSTNC 05/30/2008  . PERNICIOUS ANEMIA 03/28/2008  . Hypothyroidism 09/08/2007  . HYPERLIPIDEMIA 09/08/2007  . RESTLESS LEG SYNDROME 09/08/2007  . HASHIMOTO'S THYROIDITIS 02/23/2007    Social History   Tobacco Use  . Smoking status: Never Smoker  . Smokeless tobacco: Never Used  Substance Use Topics  . Alcohol use: No    Current Outpatient Medications:  .  acetaminophen (TYLENOL) 500 MG tablet, Take 500 mg by mouth every 6 (six) hours as needed., Disp: , Rfl:  .   Cholecalciferol (VITAMIN D3) 1000 UNITS CAPS, Take 1,000 Units by mouth daily.  , Disp: , Rfl:  .  Cranberry 200 MG CAPS, Take 1 capsule by mouth daily., Disp: , Rfl:  .  EUTHYROX 125 MCG tablet, TAKE 1 TABLET BY MOUTH ONCE DAILY BEFORE BREAKFAST, Disp: 90 tablet, Rfl: 0 .  fenofibrate 160 MG tablet, Take 1 tablet by mouth once daily, Disp: 90 tablet, Rfl: 0 .  FLUoxetine (PROZAC) 20 MG capsule, Take 1 capsule (20 mg total) by mouth daily., Disp: 30 capsule, Rfl: 3 .  glucosamine-chondroitin 500-400 MG tablet, Take 1 tablet by mouth daily., Disp: , Rfl:  .  loratadine (CLARITIN) 10 MG tablet, Take 10 mg by mouth daily.  , Disp: , Rfl:  .  meloxicam (MOBIC) 7.5 MG tablet, Take 1 tablet by mouth twice daily, Disp: 180 tablet, Rfl: 0 .  Multiple Vitamin (MULTIVITAMIN) tablet, Take 1 tablet by mouth daily.  , Disp: , Rfl:  .  NEOMYCIN-POLYMYXIN-HYDROCORTISONE (CORTISPORIN) 1 % SOLN OTIC solution, Apply one to two drops to the affected toe after soaking twice daily., Disp: 10 mL, Rfl: 1 .  NON FORMULARY, Take 1 capsule by mouth as needed., Disp: , Rfl:  .  ondansetron (ZOFRAN) 4 MG tablet, Take 4 mg by mouth every 6 (six) hours as needed (PONV/GERD)., Disp: , Rfl:  .  pantoprazole (PROTONIX) 40 MG tablet, Take 1 tablet by mouth once daily, Disp: 90 tablet, Rfl: 1 .  pravastatin (PRAVACHOL) 40 MG tablet, TAKE 1 TABLET BY MOUTH ONCE DAILY AT BEDTIME FOR CHOLESTEROL, Disp: 90 tablet, Rfl: 0 .  vitamin B-12 (CYANOCOBALAMIN) 250 MCG  tablet, Take 250 mcg by mouth daily.  , Disp: , Rfl:   Allergies  Allergen Reactions  . Gabapentin     hallucinations  . Other     Strong pain medications cause n/v  . Morphine     Nausea & vomiting    Objective:   There were no vitals taken for this visit. AAOx3, NAD NCAT, EOMI No obvious CN deficits Coloring WNL Pt is able to speak clearly, coherently without shortness of breath or increased work of breathing.  Thought process is linear.  Mood is appropriate.    Assessment and Plan:   Anxiety- improved w/ increased dose of Fluoxetine.  Pt feels less 'trembly' and overall is doing better.  She states that she didn't believe anxiety was the cause of her sxs but now she sees that it likely was.  Will continue to follow.   Annye Asa, MD 01/19/2020

## 2020-01-19 NOTE — Progress Notes (Signed)
I have discussed the procedure for the virtual visit with the patient who has given consent to proceed with assessment and treatment.   Pt unable to obtain visits.   Davis Gourd, CMA

## 2020-02-13 ENCOUNTER — Other Ambulatory Visit: Payer: Self-pay | Admitting: Family Medicine

## 2020-02-25 ENCOUNTER — Other Ambulatory Visit: Payer: Self-pay | Admitting: Family Medicine

## 2020-03-23 ENCOUNTER — Telehealth: Payer: Self-pay | Admitting: Family Medicine

## 2020-03-23 NOTE — Progress Notes (Signed)
  Chronic Care Management   Note  03/23/2020 Name: [name] MRN: [id number] DOB: [birth year]  Tasha Moss is a 84 y.o. year old female who is a primary care patient of Birdie Riddle, Aundra Millet, MD. I reached out to Cherlynn Perches by phone today in response to a referral sent by Ms. Joline Salt Conant's PCP, [name], MD.   Ms. Dejoseph was given information about Chronic Care Management services today including:  1. CCM service includes personalized support from designated clinical staff supervised by her physician, including individualized plan of care and coordination with other care providers 2. 24/7 contact phone numbers for assistance for urgent and routine care needs. 3. Service will only be billed when office clinical staff spend 20 minutes or more in a month to coordinate care. 4. Only one practitioner may furnish and bill the service in a calendar month. 5. The patient may stop CCM services at any time (effective at the end of the month) by phone call to the office staff.   Patient agreed to services and verbal consent obtained.   Follow up plan:   Earney Hamburg [employer] Scheduler

## 2020-03-29 ENCOUNTER — Other Ambulatory Visit: Payer: Self-pay

## 2020-03-29 ENCOUNTER — Ambulatory Visit: Payer: Medicare Other | Admitting: Podiatry

## 2020-03-29 DIAGNOSIS — M79674 Pain in right toe(s): Secondary | ICD-10-CM | POA: Diagnosis not present

## 2020-03-29 DIAGNOSIS — M79675 Pain in left toe(s): Secondary | ICD-10-CM

## 2020-03-29 DIAGNOSIS — B351 Tinea unguium: Secondary | ICD-10-CM

## 2020-03-29 DIAGNOSIS — L989 Disorder of the skin and subcutaneous tissue, unspecified: Secondary | ICD-10-CM

## 2020-03-31 ENCOUNTER — Other Ambulatory Visit: Payer: Self-pay | Admitting: General Practice

## 2020-03-31 DIAGNOSIS — R030 Elevated blood-pressure reading, without diagnosis of hypertension: Secondary | ICD-10-CM

## 2020-03-31 DIAGNOSIS — E039 Hypothyroidism, unspecified: Secondary | ICD-10-CM

## 2020-03-31 DIAGNOSIS — E782 Mixed hyperlipidemia: Secondary | ICD-10-CM

## 2020-04-02 ENCOUNTER — Other Ambulatory Visit: Payer: Self-pay | Admitting: Family Medicine

## 2020-04-03 NOTE — Progress Notes (Signed)
    Subjective: Patient is a 84 y.o. female presenting to the office today with a chief complaint of painful callus lesion(s) noted to the right foot that appeared a few months ago. She has not had any treatment for the lesion. Walking increases the pain.  Patient also complains of elongated, thickened nails that cause pain while ambulating in shoes. She is unable to trim her own nails. Patient presents today for further treatment and evaluation.  Past Medical History:  Diagnosis Date  . Allergy    seasonal & dust  . Anxiety   . Arthritis    DJD  . Family history of adverse reaction to anesthesia    n/v   . GERD (gastroesophageal reflux disease)   . History of vertigo   . Hyperlipidemia   . Hypertension   . Hypothyroidism   . PONV (postoperative nausea and vomiting)   . Thyroid disease    hypothyroidism post Hashimoto's & partial thyroidectomy    Objective:  Physical Exam General: Alert and oriented x3 in no acute distress  Dermatology: Hyperkeratotic lesion(s) present on the right foot. Pain on palpation with a central nucleated core noted. Skin is warm, dry and supple bilateral lower extremities. Negative for open lesions or macerations. Nails are tender, long, thickened and dystrophic with subungual debris, consistent with onychomycosis, 1-5 bilateral. No signs of infection noted.  Vascular: Palpable pedal pulses bilaterally. No edema or erythema noted. Capillary refill within normal limits.  Neurological: Epicritic and protective threshold grossly intact bilaterally.   Musculoskeletal Exam: Pain on palpation at the keratotic lesion(s) noted. Range of motion within normal limits bilateral. Muscle strength 5/5 in all groups bilateral.  Assessment: 1. Onychodystrophic nails 1-5 bilateral with hyperkeratosis of nails.  2. Onychomycosis of nail due to dermatophyte bilateral 3. Pre-ulcerative callus lesion noted to the right foot    Plan of Care:  1. Patient evaluated. 2.  Excisional debridement of keratoic lesion(s) using a chisel blade was performed without incident.  3. Dressed with light dressing. 4. Mechanical debridement of nails 1-5 bilaterally performed using a nail nipper. Filed with dremel without incident.  5. Patient is to return to the clinic in 3 months.   Brent M. Evans, DPM [facility]  Dr. Brent M. Evans, DPM    [address]                                        [place], [place] [address]                Office [phone]  Fax [phone]   

## 2020-04-07 ENCOUNTER — Other Ambulatory Visit: Payer: Self-pay | Admitting: Family Medicine

## 2020-04-12 DIAGNOSIS — R6889 Other general symptoms and signs: Secondary | ICD-10-CM | POA: Diagnosis not present

## 2020-04-18 DIAGNOSIS — H6123 Impacted cerumen, bilateral: Secondary | ICD-10-CM | POA: Diagnosis not present

## 2020-04-24 NOTE — Progress Notes (Signed)
Chronic Care Management Pharmacy  Name: Tasha Moss  MRN: [id number] DOB: 11/09/1936  Chief Complaint/ HPI  Tasha Moss,  84 y.o. , female presents for their Initial CCM visit with the clinical pharmacist via telephone due to COVID-19 Pandemic.  PCP : [name], MD  Their chronic conditions include:  Encounter Diagnoses  Name Primary?  . Gastroesophageal reflux disease without esophagitis Yes  . HYPERLIPIDEMIA   . RESTLESS LEG SYNDROME   . Depression, unspecified depression type   . Hypothyroidism, unspecified type     Office Visits:  01/19/2020 (PCP): Notes improvement in anxiety increasing Prozac to 20 mg from 10 mg at last OV.   Consult Visit: 03/29/2020 (Dr. Amalia Hailey, Podiatry): excisional debridement of keratoic lesions  01/05/2020 (Dr. Paulla Dolly, Podiatry): ingrown toe nail; "infiltrated the right second toe 60 mg Xylocaine Marcaine mixture sterile prep applied to the toe and using sterile instrumentation I remove the medial border and exposed matrix applied phenol 3 applications 30 seconds followed by alcohol lavage sterile dressing gave instructions on soaks and to leave dressing on 24 hours"  Patient Active Problem List   Diagnosis Date Noted  . 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 Depression 03/11/2016  . Cerumen impaction 02/10/2015  . Routine general medical examination at a health care facility 06/29/2014  . H/O thyroid nodule 06/21/2013  . DIVERTICULOSIS, COLON 07/18/2010  . ESOPHAGEAL REFLUX 04/09/2010  . Stricture and stenosis of esophagus 02/21/2010  . DYSPHAGIA 02/09/2010  . DEGENERATIVE JOINT DISEASE 11/22/2009  . BENIGN PAROXYSMAL POSITIONAL VERTIGO 05/15/2009  . SPINAL STENOSIS, LUMBAR 09/28/2008  . UNS ADVRS EFF UNS RX MEDICINAL&BIOLOGICAL SBSTNC 05/30/2008  . PERNICIOUS ANEMIA 03/28/2008  . Hypothyroidism 09/08/2007  . HYPERLIPIDEMIA 09/08/2007  . RESTLESS LEG SYNDROME 09/08/2007  . HASHIMOTO'S THYROIDITIS  02/23/2007   Social History   Socioeconomic History  . Marital status: Married    Spouse name: Not on file  . Number of children: 2  . Years of education: Not on file  . Highest education level: Not on file  Occupational History  . Not on file  Tobacco Use  . Smoking status: Never Smoker  . Smokeless tobacco: Never Used  Substance and Sexual Activity  . Alcohol use: No  . Drug use: No  . Sexual activity: Not on file  Other Topics Concern  . Not on file  Social History Narrative   Married for 44 years    Daughter-nurse   Daughter- Radio broadcast assistant    Social Determinants of Health   Financial Resource Strain:   . Difficulty of Paying Living Expenses:   Food Insecurity: No Food Insecurity  . Worried About Charity fundraiser in the Last Year: Never true  . Ran Out of Food in the Last Year: Never true  Transportation Needs: No Transportation Needs  . Lack of Transportation (Medical): No  . Lack of Transportation (Non-Medical): No  Physical Activity:   . Days of Exercise per Week:   . Minutes of Exercise per Session:   Stress:   . Feeling of Stress :   Social Connections:   . Frequency of Communication with Friends and Family:   . Frequency of Social Gatherings with Friends and Family:   . Attends Religious Services:   . Active Member of Clubs or Organizations:   . Attends Archivist Meetings:   Marland Kitchen Marital Status:    Family History  Problem Relation Age of Onset  . Diabetes Mother   . Hepatitis  Mother        Hepatitis B,? etiology  . Miscarriages / [place] Mother   . Stroke Mother 77  . Heart disease Father        CHF  . Cancer Paternal Aunt        GI cancers  . Heart disease Paternal Aunt        CHF  . Cancer Paternal Uncle        lung, GI  . Heart disease Paternal Uncle        CHF   Allergies  Allergen Reactions  . Gabapentin     hallucinations  . Other     Strong pain medications cause n/v  . Morphine     Nausea & vomiting    Outpatient  Encounter Medications as of 04/25/2020  Medication Sig  . acetaminophen (TYLENOL) 500 MG tablet Take 500 mg by mouth every 6 (six) hours as needed.  . Cholecalciferol (VITAMIN D3) 1000 UNITS CAPS Take 1,000 Units by mouth daily.    . Cranberry 200 MG CAPS Take 1 capsule by mouth daily.  Arna Medici 125 MCG tablet TAKE 1 TABLET BY MOUTH ONCE DAILY BEFORE BREAKFAST  . fenofibrate 160 MG tablet Take 1 tablet by mouth once daily  . FLUoxetine (PROZAC) 20 MG capsule Take 1 capsule (20 mg total) by mouth daily.  Marland Kitchen glucosamine-chondroitin 500-400 MG tablet Take 1 tablet by mouth daily.  Marland Kitchen loratadine (CLARITIN) 10 MG tablet Take 10 mg by mouth daily.    . meloxicam (MOBIC) 7.5 MG tablet Take 1 tablet by mouth twice daily  . Multiple Vitamin (MULTIVITAMIN) tablet Take 1 tablet by mouth daily.    . NON FORMULARY Take 1 capsule by mouth as needed.  . ondansetron (ZOFRAN) 4 MG tablet Take 4 mg by mouth every 6 (six) hours as needed (PONV/GERD).  Marland Kitchen pantoprazole (PROTONIX) 40 MG tablet Take 1 tablet by mouth once daily  . pravastatin (PRAVACHOL) 40 MG tablet TAKE 1 TABLET BY MOUTH ONCE DAILY AT BEDTIME FOR CHOLESTEROL  . vitamin B-12 (CYANOCOBALAMIN) 250 MCG tablet Take 250 mcg by mouth daily.    . NEOMYCIN-POLYMYXIN-HYDROCORTISONE (CORTISPORIN) 1 % SOLN OTIC solution Apply one to two drops to the affected toe after soaking twice daily. (Patient not taking: Reported on 04/25/2020)   No facility-administered encounter medications on file as of 04/25/2020.   Current Diagnosis/Assessment: Goals Addressed            This Visit's Progress   . PharmD Care Plan       CARE PLAN ENTRY  Current Barriers:  . Chronic Disease Management support, education, and care coordination needs related to Hyperlipidemia, Gastroesophageal Reflux Disease, Hypothyroidism, Depression, and RLS.  Hyperlipidemia . Pharmacist Clinical Goal(s): o Over the next 180 days, patient will work with PharmD and providers to maintain LDL  goal < 100 . Current regimen:  o Pravastatin 40 mg daily  o Fenofibrate 160 mg daily  . Interventions: o Continue current management . Patient self care activities - Over the next 180 days, patient will: o Continue current management Hypothyroidism . Pharmacist Clinical Goal(s) o Over the next 180 days, patient will work with PharmD and providers to maintain thyroid stimulating hormone in normal range. . Current regimen:  o Euthyrox 125 mcg once daily before breakfast . Interventions: o Counseled on appropriate use . Patient self care activities - Over the next 180 days, patient will: o Continue current management  Pain / RLS symptoms . Pharmacist Clinical Goal(s) o Over  the next 180 days, patient will work with PharmD and providers to minimize pain / RLS symptoms . Current regimen:  o Meloxicam 7.5 mg twice daily o Acetaminophen 500 mg every 6 hours as needed  . Interventions: o Continue current management  . Patient self care activities - Over the next 180 days, patient will: o Continue current management GERD (acid reflux) . Pharmacist Clinical Goal(s) o Over the next 180 days, patient will work with PharmD and providers to minimize GERD symptoms. . Current regimen:  o Pantoprazole 40 mg daily. . Interventions: o Continue current management . Patient self care activities - Over the next 180 days, patient will: o Continue current medication and avoid triggers discussed including mint, caffeine, chocolate, fried foods.   Medication management . Pharmacist Clinical Goal(s): o Over the next 180 days, patient will work with PharmD and providers to maintain optimal medication adherence . Current pharmacy: [facility] . Interventions o Comprehensive medication review performed. o Continue current medication management strategy . Patient self care activities - Over the next 180 days, patient will: o Focus on medication adherence by Continue current management o Take medications  as prescribed o Report any questions or concerns to PharmD and/or provider(s) Initial goal documentation.      Hyperlipidemia   Is not able to recall medication names/purposes. States consistent use of fenofibrate and pravastatin.   We discussed these medications and patient was counseled on each.    Component Value Date/Time   CHOL 152 11/10/2019 0931   TRIG 76 11/10/2019 0931   HDL 48 (L) 11/10/2019 0931   CHOLHDL 3.2 11/10/2019 0931   VLDL 23.2 05/05/2019 1352   LDLCALC 87 11/10/2019 0931   LDLDIRECT 82.0 04/09/2017 1339   Patient is currently controlled on the following medications:   Pravastatin 40 mg once daily   Fenofibrate 160 mg daily   Plan  Continue current medications.   Hypothyroidism   Patient has failed these meds in past: levothyroxine. Expresses understanding of Euthyrox and that it should be taken as one tablet first thing each morning.   We discussed:  proper administration of levothyroxine.   Lab Results  Component Value Date   TSH 0.54 12/14/2019   TSH 0.28 (L) 11/10/2019   TSH 0.79 05/05/2019   Patient is currently controlled on the following medications:   Euthyrox 125 mcg daily before breakfast   Plan  Continue current medications  Depression   Reports continued benefit with current regimen.  Taking fluoxetine 20 mg daily. Previously has tried: n/a. Denies any side effects at this time.  Patient is currently controlled on fluoxetine 20 mg daily.   Plan  Continue current medication.   GERD   Patient denies dysphagia, heartburn or nausea. Expresses understanding to avoid triggers such as caffeine, chocolate and peppermint. Has mild symptoms occasionally once every couple of weeks. Most recently got acid reflux symptoms after eating fried food.  Currently controlled on pantoprazole 40 mg daily.  Plan   Continue current medication and avoidance of dietary triggers discussed.   Osteopenia   Patient has failed these meds in  past: n/a.    Last DEXA Scan: 2015. Osteopenia. Results:  Lumbar spine (L1-L4) Femoral neck (FN) 33% L distal radius ultradistal radius  T-score n/a n/a - 1.5 - 0.2  BMD (g/cm2) n/a n/a 0.746 0.454   We discussed calcium/vitamin D recommendations for osteopenia.  Patient is not a candidate for pharmacologic treatment. Patient is currently controlled on the following medications:   Vitamin D3 1000  units daily (+1000 units in MV)  Calcium 600 mg daily (200 mg in MV and additional in diet)   Plan  Continue current medications.   Pain / RLS   Pain has not been bothersome due to chronic meloxicam and acetaminophen use. Mobility is limited. Is not able to walk down basement stairs. Husband uses golf cart to drive her around to basement doors if needed. Husband declined wanting any support such as indoor chair lift.   Mentions having RLS symptoms every night at bedtime but is not wanting to consider pharmacologic management just yet because she feels symptoms are not that bad.   Counseled on risk/benefit of NSAID and APAP use. Encouraged patient to report any worsening RLS symptoms.   Patient is currently controlled on the following medications:   Meloxicam 7.5 mg daily  Acetaminophen 500 mg every six hours as needed, consistently at least one tablet per night   Plan  Continue current medications.   Vaccines   Reviewed and discussed patient's vaccination history.    Immunization History  Administered Date(s) Administered  . Fluad Quad(high Dose 65+) 09/06/2019  . Influenza, High Dose Seasonal PF 09/16/2013, 09/13/2015, 10/09/2017, 09/18/2018  . Influenza,inj,Quad PF,6+ Mos 09/20/2014, 09/09/2016  . PPD Test 03/01/2016  . Pneumococcal Conjugate-13 01/03/2015  . Pneumococcal-Unspecified 12/10/2011   Plan  Recommended patient receive Shingles vaccine in pharmacy.   Medication Management   Is not familiar with medications by name and when reminded is not familiar with  purpose. States is very good about taking them. Difficulty locating medications throughout the phone call. Not interested in changing medication management at this time.   Reviewed each medication and purpose.   Receives prescription medications from: [place], [place] - [place] [address] [place] [address] [place] [address] Phone: [phone] Fax: [phone]  If willing to change med mgmt in future, would benefit from home delivery, synchronization, and pill packaging for medication organization and convenience.   Plan  Continue current medication management strategy.  Follow up: 3 month phone visit.  GERD, HLD, RLS, med mgmt. ______________ Visit Information SDOH (Social Determinants of Health) assessments performed: Yes.  Tasha Moss was given information about Chronic Care Management services today including:  1. CCM service includes personalized support from designated clinical staff supervised by her physician, including individualized plan of care and coordination with other care providers 2. 24/7 contact phone numbers for assistance for urgent and routine care needs. 3. Standard insurance, coinsurance, copays and deductibles apply for chronic care management only during months in which we provide at least 20 minutes of these services. Most insurances cover these services at 100%, however patients may be responsible for any copay, coinsurance and/or deductible if applicable. This service may help you avoid the need for more expensive face-to-face services. 4. Only one practitioner may furnish and bill the service in a calendar month. 5. The patient may stop CCM services at any time (effective at the end of the month) by phone call to the office staff.  Patient agreed to services and verbal consent obtained.   Madelin Rear, Pharm.D., BCGP Clinical Pharmacist [facility] at [facility] [phone]

## 2020-04-25 ENCOUNTER — Ambulatory Visit: Payer: Medicare Other

## 2020-04-25 DIAGNOSIS — E782 Mixed hyperlipidemia: Secondary | ICD-10-CM

## 2020-04-25 DIAGNOSIS — G2581 Restless legs syndrome: Secondary | ICD-10-CM

## 2020-04-25 DIAGNOSIS — E039 Hypothyroidism, unspecified: Secondary | ICD-10-CM

## 2020-04-25 DIAGNOSIS — K219 Gastro-esophageal reflux disease without esophagitis: Secondary | ICD-10-CM

## 2020-04-25 DIAGNOSIS — F32A Depression, unspecified: Secondary | ICD-10-CM

## 2020-04-25 NOTE — Patient Instructions (Addendum)
Please call me at [phone] (direct line) with any questions - thank you!  - Tasha Moss., Clinical Pharmacist  Goals Addressed            This Visit's Progress   . PharmD Care Plan       CARE PLAN ENTRY  Current Barriers:  . Chronic Disease Management support, education, and care coordination needs related to Hyperlipidemia, Gastroesophageal Reflux Disease, Hypothyroidism, Depression, and RLS.  Hyperlipidemia . Pharmacist Clinical Goal(s): o Over the next 180 days, patient will work with PharmD and providers to maintain LDL goal < 100 . Current regimen:  o Pravastatin 40 mg daily  o Fenofibrate 160 mg daily  . Interventions: o Continue current management . Patient self care activities - Over the next 180 days, patient will: o Continue current management Hypothyroidism . Pharmacist Clinical Goal(s) o Over the next 180 days, patient will work with PharmD and providers to maintain thyroid stimulating hormone in normal range. . Current regimen:  o Euthyrox 125 mcg once daily before breakfast . Interventions: o Counseled on appropriate use . Patient self care activities - Over the next 180 days, patient will: o Continue current management  Pain / RLS symptoms . Pharmacist Clinical Goal(s) o Over the next 180 days, patient will work with PharmD and providers to minimize pain / RLS symptoms . Current regimen:  o Meloxicam 7.5 mg twice daily o Acetaminophen 500 mg every 6 hours as needed  . Interventions: o Continue current management  . Patient self care activities - Over the next 180 days, patient will: o Continue current management GERD (acid reflux) . Pharmacist Clinical Goal(s) o Over the next 180 days, patient will work with PharmD and providers to minimize GERD symptoms. . Current regimen:  o Pantoprazole 40 mg daily. . Interventions: o Continue current management . Patient self care activities - Over the next 180 days, patient will: o Continue current medication  and avoid triggers discussed including mint, caffeine, chocolate, fried foods.   Medication management . Pharmacist Clinical Goal(s): o Over the next 180 days, patient will work with PharmD and providers to maintain optimal medication adherence . Current pharmacy: [facility] . Interventions o Comprehensive medication review performed. o Continue current medication management strategy . Patient self care activities - Over the next 180 days, patient will: o Focus on medication adherence by Continue current management o Take medications as prescribed o Report any questions or concerns to PharmD and/or provider(s) Initial goal documentation.      Tasha Moss was given information about Chronic Care Management services today including:  1. CCM service includes personalized support from designated clinical staff supervised by her physician, including individualized plan of care and coordination with other care providers 2. 24/7 contact phone numbers for assistance for urgent and routine care needs. 3. Standard insurance, coinsurance, copays and deductibles apply for chronic care management only during months in which we provide at least 20 minutes of these services. Most insurances cover these services at 100%, however patients may be responsible for any copay, coinsurance and/or deductible if applicable. This service may help you avoid the need for more expensive face-to-face services. 4. Only one practitioner may furnish and bill the service in a calendar month. 5. The patient may stop CCM services at any time (effective at the end of the month) by phone call to the office staff.  Patient agreed to services and verbal consent obtained.   The patient verbalized understanding of instructions provided today and agreed to receive a  mailed copy of patient instruction and/or educational materials. Telephone follow up appointment with pharmacy team member scheduled for: See next appointment with "Care  Management Staff" under "What's Next" below.   Thank you!  Madelin Rear, Pharm.D., BCGP Clinical Pharmacist [facility] at [facility] [phone]  High Cholesterol  High cholesterol is a condition in which the blood has high levels of a white, waxy, fat-like substance (cholesterol). The human body needs small amounts of cholesterol. The liver makes all the cholesterol that the body needs. Extra (excess) cholesterol comes from the food that we eat. Cholesterol is carried from the liver by the blood through the blood vessels. If you have high cholesterol, deposits (plaques) may build up on the walls of your blood vessels (arteries). Plaques make the arteries narrower and stiffer. Cholesterol plaques increase your risk for heart attack and stroke. Work with your health care provider to keep your cholesterol levels in a healthy range. What increases the risk? This condition is more likely to develop in people who:  Eat foods that are high in animal fat (saturated fat) or cholesterol.  Are overweight.  Are not getting enough exercise.  Have a family history of high cholesterol. What are the signs or symptoms? There are no symptoms of this condition. How is this diagnosed? This condition may be diagnosed from the results of a blood test.  If you are older than age 82, your health care provider may check your cholesterol every 4-6 years.  You may be checked more often if you already have high cholesterol or other risk factors for heart disease. The blood test for cholesterol measures:  "Bad" cholesterol (LDL cholesterol). This is the main type of cholesterol that causes heart disease. The desired level for LDL is less than 100.  "Good" cholesterol (HDL cholesterol). This type helps to protect against heart disease by cleaning the arteries and carrying the LDL away. The desired level for HDL is 60 or higher.  Triglycerides. These are fats that the body can store or  burn for energy. The desired number for triglycerides is lower than 150.  Total cholesterol. This is a measure of the total amount of cholesterol in your blood, including LDL cholesterol, HDL cholesterol, and triglycerides. A healthy number is less than 200. How is this treated? This condition is treated with diet changes, lifestyle changes, and medicines. Diet changes  This may include eating more whole grains, fruits, vegetables, nuts, and fish.  This may also include cutting back on red meat and foods that have a lot of added sugar. Lifestyle changes  Changes may include getting at least 40 minutes of aerobic exercise 3 times a week. Aerobic exercises include walking, biking, and swimming. Aerobic exercise along with a healthy diet can help you maintain a healthy weight.  Changes may also include quitting smoking. Medicines  Medicines are usually given if diet and lifestyle changes have failed to reduce your cholesterol to healthy levels.  Your health care provider may prescribe a statin medicine. Statin medicines have been shown to reduce cholesterol, which can reduce the risk of heart disease. Follow these instructions at home: Eating and drinking If told by your health care provider:  Eat chicken (without skin), fish, veal, shellfish, ground [place] breast, and round or loin cuts of red meat.  Do not eat fried foods or fatty meats, such as hot dogs and salami.  Eat plenty of fruits, such as apples.  Eat plenty of vegetables, such as broccoli, potatoes, and carrots.  Eat beans, peas, and lentils.  Eat grains such as barley, rice, couscous, and bulgur wheat.  Eat pasta without cream sauces.  Use skim or nonfat milk, and eat low-fat or nonfat yogurt and cheeses.  Do not eat or drink whole milk, cream, ice cream, egg yolks, or hard cheeses.  Do not eat stick margarine or tub margarines that contain trans fats (also called partially hydrogenated oils).  Do not eat  saturated tropical oils, such as coconut oil and palm oil.  Do not eat cakes, cookies, crackers, or other baked goods that contain trans fats.  General instructions  Exercise as directed by your health care provider. Increase your activity level with activities such as gardening, walking, and taking the stairs.  Take over-the-counter and prescription medicines only as told by your health care provider.  Do not use any products that contain nicotine or tobacco, such as cigarettes and e-cigarettes. If you need help quitting, ask your health care provider.  Keep all follow-up visits as told by your health care provider. This is important. Contact a health care provider if:  You are struggling to maintain a healthy diet or weight.  You need help to start on an exercise program.  You need help to stop smoking. Get help right away if:  You have chest pain.  You have trouble breathing. This information is not intended to replace advice given to you by your health care provider. Make sure you discuss any questions you have with your health care provider. Document Revised: 11/28/2017 Document Reviewed: 05/25/2016 Elsevier Patient Education  [place].

## 2020-05-10 ENCOUNTER — Ambulatory Visit: Payer: Medicare Other | Admitting: Family Medicine

## 2020-05-10 ENCOUNTER — Telehealth (INDEPENDENT_AMBULATORY_CARE_PROVIDER_SITE_OTHER): Payer: Medicare Other | Admitting: Family Medicine

## 2020-05-10 ENCOUNTER — Encounter: Payer: Self-pay | Admitting: Family Medicine

## 2020-05-10 ENCOUNTER — Other Ambulatory Visit: Payer: Self-pay

## 2020-05-10 DIAGNOSIS — R197 Diarrhea, unspecified: Secondary | ICD-10-CM

## 2020-05-10 DIAGNOSIS — E782 Mixed hyperlipidemia: Secondary | ICD-10-CM

## 2020-05-10 DIAGNOSIS — E039 Hypothyroidism, unspecified: Secondary | ICD-10-CM | POA: Diagnosis not present

## 2020-05-10 NOTE — Progress Notes (Signed)
I have discussed the procedure for the virtual visit with the patient who has given consent to proceed with assessment and treatment.   Pt unable to obtain vitals.   Tasha Moss L Juliahna Wiswell, CMA     

## 2020-05-10 NOTE — Progress Notes (Signed)
Virtual Visit via Video   I connected with patient on 05/10/20 at 10:30 AM EDT by a video enabled telemedicine application and verified that I am speaking with the correct person using two identifiers.  Location patient: Home Location provider: Acupuncturist, Office Persons participating in the virtual visit: Patient, Provider, Carlton (Jess B)  I discussed the limitations of evaluation and management by telemedicine and the availability of in person appointments. The patient expressed understanding and agreed to proceed.  Subjective:   HPI:   Hyperlipidemia- chronic problem, on Pravastatin 40mg  and Fenofibrate 160mg .  No CP, SOB, abd pain, N/V.  Hypothyroid- chronic problem, on Euthyrox 150mcg daily.  Feels less jittery.  Energy level is good.  Denies changes to skin/hair/nails.  Diarrhea- pt reports stools were 'a little loose' yesterday and 'much looser today'.  Denies fever, abd pain, N/V.  No blood in stool.  'I don't feel sick'.  Thinks it may have been something she ate.  ROS:   See pertinent positives and negatives per HPI.  Patient Active Problem List   Diagnosis Date Noted   Status post reverse arthroplasty of left shoulder 03/11/2016   Depression 03/11/2016   Cerumen impaction 02/10/2015   Routine general medical examination at a health care facility 06/29/2014   H/O thyroid nodule 06/21/2013   DIVERTICULOSIS, COLON 07/18/2010   ESOPHAGEAL REFLUX 04/09/2010   Stricture and stenosis of esophagus 02/21/2010   DYSPHAGIA 02/09/2010   DEGENERATIVE JOINT DISEASE 11/22/2009   BENIGN PAROXYSMAL POSITIONAL VERTIGO 05/15/2009   SPINAL STENOSIS, LUMBAR 09/28/2008   UNS ADVRS EFF UNS RX MEDICINAL&BIOLOGICAL SBSTNC 05/30/2008   PERNICIOUS ANEMIA 03/28/2008   Hypothyroidism 09/08/2007   HYPERLIPIDEMIA 09/08/2007   RESTLESS LEG SYNDROME 09/08/2007   HASHIMOTO'S THYROIDITIS 02/23/2007    Social History   Tobacco Use   Smoking status: Never Smoker     Smokeless tobacco: Never Used  Substance Use Topics   Alcohol use: No    Current Outpatient Medications:    acetaminophen (TYLENOL) 500 MG tablet, Take 500 mg by mouth every 6 (six) hours as needed., Disp: , Rfl:    chlorhexidine (PERIDEX) 0.12 % solution, SWISH ORALLY WITH 1 CAPFUL FOR 30 SECONDS THEN SPIT OUT TWICE DAILY FOR 2 WEEKS, Disp: , Rfl:    Cholecalciferol (VITAMIN D3) 1000 UNITS CAPS, Take 1,000 Units by mouth daily.  , Disp: , Rfl:    Cranberry 200 MG CAPS, Take 1 capsule by mouth daily., Disp: , Rfl:    EUTHYROX 125 MCG tablet, TAKE 1 TABLET BY MOUTH ONCE DAILY BEFORE BREAKFAST, Disp: 90 tablet, Rfl: 0   fenofibrate 160 MG tablet, Take 1 tablet by mouth once daily, Disp: 90 tablet, Rfl: 0   FLUoxetine (PROZAC) 20 MG capsule, Take 1 capsule (20 mg total) by mouth daily., Disp: 30 capsule, Rfl: 3   glucosamine-chondroitin 500-400 MG tablet, Take 1 tablet by mouth daily., Disp: , Rfl:    loratadine (CLARITIN) 10 MG tablet, Take 10 mg by mouth daily.  , Disp: , Rfl:    meloxicam (MOBIC) 7.5 MG tablet, Take 1 tablet by mouth twice daily, Disp: 180 tablet, Rfl: 0   Multiple Vitamin (MULTIVITAMIN) tablet, Take 1 tablet by mouth daily.  , Disp: , Rfl:    NEOMYCIN-POLYMYXIN-HYDROCORTISONE (CORTISPORIN) 1 % SOLN OTIC solution, Apply one to two drops to the affected toe after soaking twice daily., Disp: 10 mL, Rfl: 1   NON FORMULARY, Take 1 capsule by mouth as needed., Disp: , Rfl:    ondansetron (ZOFRAN)  4 MG tablet, Take 4 mg by mouth every 6 (six) hours as needed (PONV/GERD)., Disp: , Rfl:    pantoprazole (PROTONIX) 40 MG tablet, Take 1 tablet by mouth once daily, Disp: 90 tablet, Rfl: 1   pravastatin (PRAVACHOL) 40 MG tablet, TAKE 1 TABLET BY MOUTH ONCE DAILY AT BEDTIME FOR CHOLESTEROL, Disp: 90 tablet, Rfl: 0   vitamin B-12 (CYANOCOBALAMIN) 250 MCG tablet, Take 250 mcg by mouth daily.  , Disp: , Rfl:   Allergies  Allergen Reactions   Gabapentin      hallucinations   Other     Strong pain medications cause n/v   Morphine     Nausea & vomiting    Objective:   There were no vitals taken for this visit. AAOx3, NAD NCAT, EOMI No obvious CN deficits Coloring WNL Pt is able to speak clearly, coherently without shortness of breath or increased work of breathing.  Thought process is linear.  Mood is appropriate.   Assessment and Plan:   Hyperlipidemia- chronic problem.  Tolerating statin and Fenofibrate w/o difficulty.  Check labs.  Adjust meds prn   Hypothyroid- chronic problem.  Pt reports feeling better.  Currently asymptomatic.  Check labs.  Adjust meds prn   Diarrhea- pt reports loose stools yesterday and today but no fever, abdominal pain, N/V.  Feels it is due to the amount of fresh vegetables she is eating.  Reviewed supportive care and encouraged her to let me know if things weren't improving.  Pt expressed understanding and is in agreement w/ plan.    Annye Asa, MD 05/10/2020

## 2020-05-11 ENCOUNTER — Telehealth: Payer: Self-pay | Admitting: Family Medicine

## 2020-05-11 NOTE — Telephone Encounter (Signed)
Closed

## 2020-05-11 NOTE — Telephone Encounter (Signed)
No further notes

## 2020-05-12 ENCOUNTER — Ambulatory Visit: Payer: Medicare Other | Admitting: Family Medicine

## 2020-05-14 ENCOUNTER — Other Ambulatory Visit: Payer: Self-pay | Admitting: Family Medicine

## 2020-05-22 ENCOUNTER — Other Ambulatory Visit: Payer: Self-pay | Admitting: Family Medicine

## 2020-05-23 ENCOUNTER — Other Ambulatory Visit: Payer: Self-pay

## 2020-05-23 ENCOUNTER — Ambulatory Visit (INDEPENDENT_AMBULATORY_CARE_PROVIDER_SITE_OTHER): Payer: Medicare Other

## 2020-05-23 DIAGNOSIS — E782 Mixed hyperlipidemia: Secondary | ICD-10-CM

## 2020-05-23 DIAGNOSIS — E039 Hypothyroidism, unspecified: Secondary | ICD-10-CM | POA: Diagnosis not present

## 2020-05-23 LAB — BASIC METABOLIC PANEL
BUN: 15 mg/dL (ref 6–23)
CO2: 32 mEq/L (ref 19–32)
Calcium: 9.1 mg/dL (ref 8.4–10.5)
Chloride: 103 mEq/L (ref 96–112)
Creatinine, Ser: 0.58 mg/dL (ref 0.40–1.20)
GFR: 99.44 mL/min (ref >60.00)
Glucose, Bld: 130 mg/dL — ABNORMAL HIGH (ref 70–99)
Potassium: 3.9 mEq/L (ref 3.5–5.1)
Sodium: 139 mEq/L (ref 135–145)

## 2020-05-23 LAB — CBC WITH DIFFERENTIAL/PLATELET
Basophils Absolute: 0 10*3/uL (ref 0.0–0.1)
Basophils Relative: 0.5 % (ref 0.0–3.0)
Eosinophils Absolute: 0.3 10*3/uL (ref 0.0–0.7)
Eosinophils Relative: 4 % (ref 0.0–5.0)
HCT: 34.3 % — ABNORMAL LOW (ref 36.0–46.0)
Hemoglobin: 11.8 g/dL — ABNORMAL LOW (ref 12.0–15.0)
Lymphocytes Relative: 25 % (ref 12.0–46.0)
Lymphs Abs: 1.7 10*3/uL (ref 0.7–4.0)
MCHC: 34.4 g/dL (ref 30.0–36.0)
MCV: 89.4 fl (ref 78.0–100.0)
Monocytes Absolute: 0.6 10*3/uL (ref 0.1–1.0)
Monocytes Relative: 9 % (ref 3.0–12.0)
Neutro Abs: 4.1 10*3/uL (ref 1.4–7.7)
Neutrophils Relative %: 61.5 % (ref 43.0–77.0)
Platelets: 226 10*3/uL (ref 150.0–400.0)
RBC: 3.84 Mil/uL — ABNORMAL LOW (ref 3.87–5.11)
RDW: 14.3 % (ref 11.5–15.5)
WBC: 6.7 10*3/uL (ref 4.0–10.5)

## 2020-05-23 LAB — HEPATIC FUNCTION PANEL
ALT: 9 U/L (ref 0–35)
AST: 13 U/L (ref 0–37)
Albumin: 3.7 g/dL (ref 3.5–5.2)
Alkaline Phosphatase: 46 U/L (ref 39–117)
Bilirubin, Direct: 0.1 mg/dL (ref 0.0–0.3)
Total Bilirubin: 0.4 mg/dL (ref 0.2–1.2)
Total Protein: 5.8 g/dL — ABNORMAL LOW (ref 6.0–8.3)

## 2020-05-23 LAB — LIPID PANEL
Cholesterol: 152 mg/dL (ref 0–200)
HDL: 42.6 mg/dL (ref >39.00)
LDL Cholesterol: 78 mg/dL (ref 0–99)
NonHDL: 109.76
Total CHOL/HDL Ratio: 4
Triglycerides: 157 mg/dL — ABNORMAL HIGH (ref 0.0–149.0)
VLDL: 31.4 mg/dL (ref 0.0–40.0)

## 2020-05-23 LAB — TSH: TSH: 0.51 u[IU]/mL (ref 0.35–4.50)

## 2020-05-23 NOTE — Addendum Note (Signed)
Addended by: [name] on: 05/23/2020 08:04 AM   Modules accepted: Orders

## 2020-05-24 ENCOUNTER — Encounter: Payer: Self-pay | Admitting: General Practice

## 2020-05-28 ENCOUNTER — Other Ambulatory Visit: Payer: Self-pay | Admitting: Family Medicine

## 2020-06-02 ENCOUNTER — Other Ambulatory Visit: Payer: Self-pay | Admitting: Family Medicine

## 2020-06-26 ENCOUNTER — Other Ambulatory Visit: Payer: Self-pay | Admitting: Family Medicine

## 2020-06-28 ENCOUNTER — Encounter: Payer: Self-pay | Admitting: Podiatry

## 2020-06-28 ENCOUNTER — Other Ambulatory Visit: Payer: Self-pay

## 2020-06-28 ENCOUNTER — Ambulatory Visit: Payer: Medicare Other | Admitting: Podiatry

## 2020-06-28 DIAGNOSIS — M79675 Pain in left toe(s): Secondary | ICD-10-CM | POA: Diagnosis not present

## 2020-06-28 DIAGNOSIS — L989 Disorder of the skin and subcutaneous tissue, unspecified: Secondary | ICD-10-CM | POA: Diagnosis not present

## 2020-06-28 DIAGNOSIS — M79674 Pain in right toe(s): Secondary | ICD-10-CM

## 2020-06-28 DIAGNOSIS — B351 Tinea unguium: Secondary | ICD-10-CM

## 2020-06-28 NOTE — Progress Notes (Signed)
    Subjective: Patient is a 84 y.o. female presenting to the office today with a chief complaint of painful callus lesion(s) noted to the right foot that appeared a few months ago. She has not had any treatment for the lesion. Walking increases the pain.  Patient also complains of elongated, thickened nails that cause pain while ambulating in shoes. She is unable to trim her own nails. Patient presents today for further treatment and evaluation.  Past Medical History:  Diagnosis Date  . Allergy    seasonal & dust  . Anxiety   . Arthritis    DJD  . Family history of adverse reaction to anesthesia    n/v   . GERD (gastroesophageal reflux disease)   . History of vertigo   . Hyperlipidemia   . Hypertension   . Hypothyroidism   . PONV (postoperative nausea and vomiting)   . Thyroid disease    hypothyroidism post Hashimoto's & partial thyroidectomy    Objective:  Physical Exam General: Alert and oriented x3 in no acute distress  Dermatology: Hyperkeratotic lesion(s) present on the right foot. Pain on palpation with a central nucleated core noted. Skin is warm, dry and supple bilateral lower extremities. Negative for open lesions or macerations. Nails are tender, long, thickened and dystrophic with subungual debris, consistent with onychomycosis, 1-5 bilateral. No signs of infection noted.  Vascular: Palpable pedal pulses bilaterally. No edema or erythema noted. Capillary refill within normal limits.  Neurological: Epicritic and protective threshold grossly intact bilaterally.   Musculoskeletal Exam: Pain on palpation at the keratotic lesion(s) noted. Range of motion within normal limits bilateral. Muscle strength 5/5 in all groups bilateral.  Assessment: 1. Onychodystrophic nails 1-5 bilateral with hyperkeratosis of nails.  2. Onychomycosis of nail due to dermatophyte bilateral 3. Pre-ulcerative callus lesion noted to the right foot    Plan of Care:  1. Patient evaluated. 2.  Excisional debridement of keratoic lesion(s) using a chisel blade was performed without incident.  3. Dressed with light dressing. 4. Mechanical debridement of nails 1-5 bilaterally performed using a nail nipper. Filed with dremel without incident.  5. Patient is to return to the clinic in 3 months.   Isaura Schiller M. Riggin Cuttino, DPM [facility]  Dr. Chiquitta Matty M. March Joos, DPM    [address]                                        [place], [place] [address]                Office [phone]  Fax [phone]   

## 2020-07-02 ENCOUNTER — Other Ambulatory Visit: Payer: Self-pay | Admitting: Family Medicine

## 2020-07-07 ENCOUNTER — Other Ambulatory Visit: Payer: Self-pay | Admitting: Family Medicine

## 2020-07-10 DIAGNOSIS — L821 Other seborrheic keratosis: Secondary | ICD-10-CM | POA: Diagnosis not present

## 2020-07-10 DIAGNOSIS — C44719 Basal cell carcinoma of skin of left lower limb, including hip: Secondary | ICD-10-CM | POA: Diagnosis not present

## 2020-07-10 DIAGNOSIS — C44712 Basal cell carcinoma of skin of right lower limb, including hip: Secondary | ICD-10-CM | POA: Diagnosis not present

## 2020-07-10 DIAGNOSIS — L218 Other seborrheic dermatitis: Secondary | ICD-10-CM | POA: Diagnosis not present

## 2020-07-10 DIAGNOSIS — L98499 Non-pressure chronic ulcer of skin of other sites with unspecified severity: Secondary | ICD-10-CM | POA: Diagnosis not present

## 2020-07-25 ENCOUNTER — Telehealth: Payer: Medicare Other

## 2020-07-27 ENCOUNTER — Ambulatory Visit: Payer: Medicare Other

## 2020-07-27 DIAGNOSIS — K219 Gastro-esophageal reflux disease without esophagitis: Secondary | ICD-10-CM

## 2020-07-27 DIAGNOSIS — F329 Major depressive disorder, single episode, unspecified: Secondary | ICD-10-CM

## 2020-07-27 DIAGNOSIS — F32A Depression, unspecified: Secondary | ICD-10-CM

## 2020-07-27 NOTE — Patient Instructions (Signed)
Please review care plan below and call me at [phone] (direct line) with any questions!  Thank you, Edyth Gunnels., Clinical Pharmacist  Goals Addressed            This Visit's Progress    PharmD Care Plan   On track    CARE PLAN ENTRY  Current Barriers:   Chronic Disease Management support, education, and care coordination needs related to Hyperlipidemia, Gastroesophageal Reflux Disease, Hypothyroidism, Depression, and RLS.  Hyperlipidemia  Pharmacist Clinical Goal(s): o Over the next 180 days, patient will work with PharmD and providers to maintain LDL goal < 100  Current regimen:  o Pravastatin 40 mg daily  o Fenofibrate 160 mg daily   Interventions: o Continue current management  Patient self care activities - Over the next 180 days, patient will: o Continue current management Hypothyroidism  Pharmacist Clinical Goal(s) o Over the next 180 days, patient will work with PharmD and providers to maintain thyroid stimulating hormone in normal range.  Current regimen:  o Euthyrox 125 mcg once daily before breakfast  Interventions: o Counseled on appropriate use  Patient self care activities - Over the next 180 days, patient will: o Continue current management  Depression/Anxiety  Pharmacist Clinical Goal(s) o Over the next 180 days, patient will work with PharmD and providers to minimize depression/anxiety symptoms  Current regimen: o Fluoxetine 20 mh once daily   Interventions: o Continue current management   Patient self care activities - Over the next 180 days, patient will: o Continue current management GERD (acid reflux)  Pharmacist Clinical Goal(s) o Over the next 180 days, patient will work with PharmD and providers to minimize GERD symptoms.  Current regimen:  o Pantoprazole 40 mg daily.  Interventions: o Continue current management  Patient self care activities - Over the next 180 days, patient will: o Continue current medication and avoid  triggers discussed including mint, caffeine, chocolate, fried foods.   Medication management  Pharmacist Clinical Goal(s): o Over the next 180 days, patient will work with PharmD and providers to maintain optimal medication adherence  Current pharmacy: [facility]  Interventions o Comprehensive medication review performed. o Continue current medication management strategy  Patient self care activities - Over the next 180 days, patient will: o Focus on medication adherence by Continue current management o Take medications as prescribed o Report any questions or concerns to PharmD and/or provider(s) Initial goal documentation.     The patient verbalized understanding of instructions provided today and agreed to receive a mailed copy of patient instruction and/or educational materials. Telephone follow up appointment with pharmacy team member scheduled for: See next appointment with "Care Management Staff" under "What's Next" below.   Madelin Rear, Pharm.D., BCGP Clinical Pharmacist [facility] [phone]

## 2020-07-27 NOTE — Progress Notes (Signed)
Chronic Care Management Pharmacy  Name: Tasha Moss  MRN: [id number] DOB: 03/13/1936  Chief Complaint/ HPI  Tasha Moss,  84 y.o. , female presents for their Initial CCM visit with the clinical pharmacist via telephone due to COVID-19 Pandemic  PCP : [name], MD  Their chronic conditions include:  Encounter Diagnoses  Name Primary?  . Depression, unspecified depression type Yes  . Gastroesophageal reflux disease without esophagitis     Office Visits:  01/19/2020 (PCP): Notes improvement in anxiety increasing Prozac to 20 mg from 10 mg at last OV.   Consult Visit: 03/29/2020 (Dr. Amalia Hailey, Podiatry): excisional debridement of keratoic lesions  01/05/2020 (Dr. Paulla Dolly, Podiatry): ingrown toe nail; "infiltrated the right second toe 60 mg Xylocaine Marcaine mixture sterile prep applied to the toe and using sterile instrumentation I remove the medial border and exposed matrix applied phenol 3 applications 30 seconds followed by alcohol lavage sterile dressing gave instructions on soaks and to leave dressing on 24 hours"  Patient Active Problem List   Diagnosis Date Noted  . 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 Depression 03/11/2016  . Cerumen impaction 02/10/2015  . Routine general medical examination at a health care facility 06/29/2014  . H/O thyroid nodule 06/21/2013  . DIVERTICULOSIS, COLON 07/18/2010  . ESOPHAGEAL REFLUX 04/09/2010  . Stricture and stenosis of esophagus 02/21/2010  . DYSPHAGIA 02/09/2010  . DEGENERATIVE JOINT DISEASE 11/22/2009  . BENIGN PAROXYSMAL POSITIONAL VERTIGO 05/15/2009  . SPINAL STENOSIS, LUMBAR 09/28/2008  . UNS ADVRS EFF UNS RX MEDICINAL&BIOLOGICAL SBSTNC 05/30/2008  . PERNICIOUS ANEMIA 03/28/2008  . Hypothyroidism 09/08/2007  . HYPERLIPIDEMIA 09/08/2007  . RESTLESS LEG SYNDROME 09/08/2007  . HASHIMOTO'S THYROIDITIS 02/23/2007   Social History   Socioeconomic History  . Marital status: Married    Spouse  name: Not on file  . Number of children: 2  . Years of education: Not on file  . Highest education level: Not on file  Occupational History  . Not on file  Tobacco Use  . Smoking status: Never Smoker  . Smokeless tobacco: Never Used  Vaping Use  . Vaping Use: Never used  Substance and Sexual Activity  . Alcohol use: No  . Drug use: No  . Sexual activity: Not on file  Other Topics Concern  . Not on file  Social History Narrative   Married for 56 years    Daughter-nurse   Daughter- Radio broadcast assistant    Social Determinants of Health   Financial Resource Strain:   . Difficulty of Paying Living Expenses: Not on file  Food Insecurity: No Food Insecurity  . Worried About Charity fundraiser in the Last Year: Never true  . Ran Out of Food in the Last Year: Never true  Transportation Needs: No Transportation Needs  . Lack of Transportation (Medical): No  . Lack of Transportation (Non-Medical): No  Physical Activity:   . Days of Exercise per Week: Not on file  . Minutes of Exercise per Session: Not on file  Stress:   . Feeling of Stress : Not on file  Social Connections:   . Frequency of Communication with Friends and Family: Not on file  . Frequency of Social Gatherings with Friends and Family: Not on file  . Attends Religious Services: Not on file  . Active Member of Clubs or Organizations: Not on file  . Attends Archivist Meetings: Not on file  . Marital Status: Not on file   Family History  Problem Relation Age of Onset  . Diabetes Mother   . Hepatitis Mother        Hepatitis B,? etiology  . Miscarriages / [place] Mother   . Stroke Mother 74  . Heart disease Father        CHF  . Cancer Paternal Aunt        GI cancers  . Heart disease Paternal Aunt        CHF  . Cancer Paternal Uncle        lung, GI  . Heart disease Paternal Uncle        CHF   Allergies  Allergen Reactions  . Gabapentin     hallucinations  . Other     Strong pain medications cause  n/v  . Morphine     Nausea & vomiting   Outpatient Encounter Medications as of 07/27/2020  Medication Sig  . acetaminophen (TYLENOL) 500 MG tablet Take 500 mg by mouth every 6 (six) hours as needed.  . chlorhexidine (PERIDEX) 0.12 % solution SWISH ORALLY WITH 1 CAPFUL FOR 30 SECONDS THEN SPIT OUT TWICE DAILY FOR 2 WEEKS  . Cholecalciferol (VITAMIN D3) 1000 UNITS CAPS Take 1,000 Units by mouth daily.    . Cranberry 200 MG CAPS Take 1 capsule by mouth daily.  Arna Medici 125 MCG tablet TAKE 1 TABLET BY MOUTH ONCE DAILY BEFORE BREAKFAST  . fenofibrate 160 MG tablet Take 1 tablet by mouth once daily  . FLUoxetine (PROZAC) 20 MG capsule Take 1 capsule by mouth once daily  . glucosamine-chondroitin 500-400 MG tablet Take 1 tablet by mouth daily.  Marland Kitchen loratadine (CLARITIN) 10 MG tablet Take 10 mg by mouth daily.    . meloxicam (MOBIC) 7.5 MG tablet Take 1 tablet by mouth twice daily  . Multiple Vitamin (MULTIVITAMIN) tablet Take 1 tablet by mouth daily.    . NEOMYCIN-POLYMYXIN-HYDROCORTISONE (CORTISPORIN) 1 % SOLN OTIC solution Apply one to two drops to the affected toe after soaking twice daily.  . NON FORMULARY Take 1 capsule by mouth as needed.  . ondansetron (ZOFRAN) 4 MG tablet Take 4 mg by mouth every 6 (six) hours as needed (PONV/GERD).  Marland Kitchen pantoprazole (PROTONIX) 40 MG tablet Take 1 tablet by mouth once daily  . pravastatin (PRAVACHOL) 40 MG tablet TAKE 1 TABLET BY MOUTH ONCE DAILY AT BEDTIME FOR CHOLESTEROL  . vitamin B-12 (CYANOCOBALAMIN) 250 MCG tablet Take 250 mcg by mouth daily.     No facility-administered encounter medications on file as of 07/27/2020.   Current Diagnosis/Assessment: Goals Addressed            This Visit's Progress   . PharmD Care Plan   On track    CARE PLAN ENTRY  Current Barriers:  . Chronic Disease Management support, education, and care coordination needs related to Hyperlipidemia, Gastroesophageal Reflux Disease, Hypothyroidism, Depression, and RLS.   Hyperlipidemia . Pharmacist Clinical Goal(s): o Over the next 180 days, patient will work with PharmD and providers to maintain LDL goal < 100 . Current regimen:  o Pravastatin 40 mg daily  o Fenofibrate 160 mg daily  . Interventions: o Continue current management . Patient self care activities - Over the next 180 days, patient will: o Continue current management Hypothyroidism . Pharmacist Clinical Goal(s) o Over the next 180 days, patient will work with PharmD and providers to maintain thyroid stimulating hormone in normal range. . Current regimen:  o Euthyrox 125 mcg once daily before breakfast . Interventions: o Counseled on appropriate use .  Patient self care activities - Over the next 180 days, patient will: o Continue current management  Depression/Anxiety . Pharmacist Clinical Goal(s) o Over the next 180 days, patient will work with PharmD and providers to minimize depression/anxiety symptoms . Current regimen: o Fluoxetine 20 mh once daily  . Interventions: o Continue current management  . Patient self care activities - Over the next 180 days, patient will: o Continue current management GERD (acid reflux) . Pharmacist Clinical Goal(s) o Over the next 180 days, patient will work with PharmD and providers to minimize GERD symptoms. . Current regimen:  o Pantoprazole 40 mg daily. . Interventions: o Continue current management . Patient self care activities - Over the next 180 days, patient will: o Continue current medication and avoid triggers discussed including mint, caffeine, chocolate, fried foods.   Medication management . Pharmacist Clinical Goal(s): o Over the next 180 days, patient will work with PharmD and providers to maintain optimal medication adherence . Current pharmacy: [facility] . Interventions o Comprehensive medication review performed. o Continue current medication management strategy . Patient self care activities - Over the next 180 days, patient  will: o Focus on medication adherence by Continue current management o Take medications as prescribed o Report any questions or concerns to PharmD and/or provider(s) Initial goal documentation.      Hyperlipidemia   LDL goal <100     Component Value Date/Time   CHOL 152 05/23/2020 1337   TRIG 157.0 (H) 05/23/2020 1337   HDL 42.60 05/23/2020 1337   CHOLHDL 4 05/23/2020 1337   VLDL 31.4 05/23/2020 1337   LDLCALC 78 05/23/2020 1337   LDLCALC 87 11/10/2019 0931   LDLDIRECT 82.0 04/09/2017 1339   Patient is currently controlled on the following medications:   Pravastatin 40 mg once daily   Fenofibrate 160 mg daily   Plan  Continue current medications.   Hypothyroidism   Lab Results  Component Value Date   TSH 0.51 05/23/2020   TSH 0.54 12/14/2019   TSH 0.28 (L) 11/10/2019   Patient is currently controlled on the following medications:   Euthyrox 125 mcg daily before breakfast   Plan  Continue current medications  Depression/anxiety   PHQ 9 0. GAD7 0.  Previously has tried: n/a. Denies any side effects at this time. Patient is currently controlled on fluoxetine 20 mg daily.   Plan  Continue current medication.   GERD   Has not had any issues with acid reflux.  Currently controlled on:  pantoprazole 40 mg daily.  Reviewed reflux triggers. Plan   Continue current medication and avoidance of dietary triggers discussed.   Osteopenia   Patient has failed these meds in past: n/a.    Last DEXA Scan: 2015. Osteopenia. Results:  We discussed calcium/vitamin D recommendations for osteopenia, weight bearing exercises.  Patient is not a candidate for pharmacologic treatment. Patient is currently controlled on the following medications:   Vitamin D3 1000 units daily (+1000 units in MV)  Calcium 600 mg daily (200 mg in MV and additional in diet)   Plan  Continue current medications.   Vaccines   Reviewed and discussed patient's vaccination  history.    Immunization History  Administered Date(s) Administered  . Fluad Quad(high Dose 65+) 09/06/2019  . Influenza, High Dose Seasonal PF 09/16/2013, 09/13/2015, 10/09/2017, 09/18/2018  . Influenza,inj,Quad PF,6+ Mos 09/20/2014, 09/09/2016  . Moderna SARS-COVID-2 Vaccination 01/01/2020, 01/31/2020  . PPD Test 03/01/2016  . Pneumococcal Conjugate-13 01/03/2015  . Pneumococcal-Unspecified 12/10/2011   Due for Shingrix,  tdap  Plan  Recommended patient receive tdap, Shingles vaccine in pharmacy.   Medication Management   Receives prescription medications from: [place], [place] - [place] [address] [place] [address] Phone: [phone] Fax: [phone]  If willing to change med mgmt in future, would benefit from home delivery, synchronization, and pill packaging for medication organization and convenience.   Plan  Continue current medication management strategy.  Follow up: 6 month phone visit.  Reviewed upcoming visits with patient. Future Appointments  Date Time Provider Sims  10/04/2020  1:45 PM Edrick Kins, [place] [facility]  11/14/2020 10:00 AM [name], MD [facility]   ______________ Visit Information SDOH (Social Determinants of Health) assessments performed: Yes.  Madelin Rear, Pharm.D., BCGP Clinical Pharmacist [facility] [phone]

## 2020-08-06 ENCOUNTER — Other Ambulatory Visit: Payer: Self-pay | Admitting: Family Medicine

## 2020-08-30 ENCOUNTER — Other Ambulatory Visit: Payer: Self-pay | Admitting: Family Medicine

## 2020-09-04 ENCOUNTER — Other Ambulatory Visit: Payer: Self-pay | Admitting: Family Medicine

## 2020-09-06 ENCOUNTER — Other Ambulatory Visit: Payer: Self-pay

## 2020-09-06 ENCOUNTER — Ambulatory Visit (INDEPENDENT_AMBULATORY_CARE_PROVIDER_SITE_OTHER): Payer: Medicare Other

## 2020-09-06 DIAGNOSIS — Z23 Encounter for immunization: Secondary | ICD-10-CM

## 2020-09-14 ENCOUNTER — Telehealth: Payer: Self-pay

## 2020-09-14 NOTE — Progress Notes (Signed)
Chronic Care Management Pharmacy Assistant   Name: KADESHA VIRRUETA  MRN: [id number] DOB: 28-Nov-1936  Reason for Encounter: Medication Review/ General Adherence Call .     PCP : [name], MD  Allergies:   Allergies  Allergen Reactions   Gabapentin     hallucinations   Other     Strong pain medications cause n/v   Morphine     Nausea & vomiting    Medications: Outpatient Encounter Medications as of 09/14/2020  Medication Sig   acetaminophen (TYLENOL) 500 MG tablet Take 500 mg by mouth every 6 (six) hours as needed.   chlorhexidine (PERIDEX) 0.12 % solution SWISH ORALLY WITH 1 CAPFUL FOR 30 SECONDS THEN SPIT OUT TWICE DAILY FOR 2 WEEKS   Cholecalciferol (VITAMIN D3) 1000 UNITS CAPS Take 1,000 Units by mouth daily.     Cranberry 200 MG CAPS Take 1 capsule by mouth daily.   EUTHYROX 125 MCG tablet TAKE 1 TABLET BY MOUTH ONCE DAILY BEFORE BREAKFAST   fenofibrate 160 MG tablet Take 1 tablet by mouth once daily   FLUoxetine (PROZAC) 20 MG capsule Take 1 capsule by mouth once daily   glucosamine-chondroitin 500-400 MG tablet Take 1 tablet by mouth daily.   loratadine (CLARITIN) 10 MG tablet Take 10 mg by mouth daily.     meloxicam (MOBIC) 7.5 MG tablet Take 1 tablet by mouth twice daily   Multiple Vitamin (MULTIVITAMIN) tablet Take 1 tablet by mouth daily.     NEOMYCIN-POLYMYXIN-HYDROCORTISONE (CORTISPORIN) 1 % SOLN OTIC solution Apply one to two drops to the affected toe after soaking twice daily.   NON FORMULARY Take 1 capsule by mouth as needed.   ondansetron (ZOFRAN) 4 MG tablet Take 4 mg by mouth every 6 (six) hours as needed (PONV/GERD).   pantoprazole (PROTONIX) 40 MG tablet Take 1 tablet by mouth once daily   pravastatin (PRAVACHOL) 40 MG tablet TAKE 1 TABLET BY MOUTH ONCE DAILY AT BEDTIME FOR CHOLESTEROL   vitamin B-12 (CYANOCOBALAMIN) 250 MCG tablet Take 250 mcg by mouth daily.     No facility-administered encounter medications on file as of  09/14/2020.    Current Diagnosis: Patient Active Problem List   Diagnosis Date Noted   Status post reverse arthroplasty of left shoulder 03/11/2016   Depression 03/11/2016   Cerumen impaction 02/10/2015   Routine general medical examination at a health care facility 06/29/2014   H/O thyroid nodule 06/21/2013   DIVERTICULOSIS, COLON 07/18/2010   ESOPHAGEAL REFLUX 04/09/2010   Stricture and stenosis of esophagus 02/21/2010   DYSPHAGIA 02/09/2010   DEGENERATIVE JOINT DISEASE 11/22/2009   BENIGN PAROXYSMAL POSITIONAL VERTIGO 05/15/2009   SPINAL STENOSIS, LUMBAR 09/28/2008   UNS ADVRS EFF UNS RX MEDICINAL&BIOLOGICAL SBSTNC 05/30/2008   PERNICIOUS ANEMIA 03/28/2008   Hypothyroidism 09/08/2007   HYPERLIPIDEMIA 09/08/2007   RESTLESS LEG SYNDROME 09/08/2007   HASHIMOTO'S THYROIDITIS 02/23/2007   Have you had any problems recently with your health? Patients husband states there has been no problems recently Have you had any problems with your pharmacy? Patients husband states they have not had any problems with pharmacy What issues or side effects are you having with your medications? Patient husbands states patient is not having any side effects from medications. What would you like me to pass along to [facility] for them to help you with? Patients husband states not at this moment What can we do to take care of you better? Patients husband states not that he can think of at this  moment.  Georgiana Shore ,[place] Pharmacist Assistant [phone]  Follow-Up:  Pharmacist Review

## 2020-09-15 DIAGNOSIS — H353131 Nonexudative age-related macular degeneration, bilateral, early dry stage: Secondary | ICD-10-CM | POA: Diagnosis not present

## 2020-09-15 DIAGNOSIS — H04123 Dry eye syndrome of bilateral lacrimal glands: Secondary | ICD-10-CM | POA: Diagnosis not present

## 2020-09-15 DIAGNOSIS — H18423 Band keratopathy, bilateral: Secondary | ICD-10-CM | POA: Diagnosis not present

## 2020-09-15 DIAGNOSIS — H524 Presbyopia: Secondary | ICD-10-CM | POA: Diagnosis not present

## 2020-09-29 ENCOUNTER — Other Ambulatory Visit: Payer: Self-pay | Admitting: Family Medicine

## 2020-09-29 NOTE — Telephone Encounter (Signed)
LFD 07/03/20 #180 with no refills LOV 05/10/20 NOV 11/14/20

## 2020-10-02 ENCOUNTER — Other Ambulatory Visit: Payer: Self-pay | Admitting: Family Medicine

## 2020-10-04 ENCOUNTER — Ambulatory Visit: Payer: Medicare Other | Admitting: Podiatry

## 2020-10-04 ENCOUNTER — Other Ambulatory Visit: Payer: Self-pay

## 2020-10-04 DIAGNOSIS — B351 Tinea unguium: Secondary | ICD-10-CM

## 2020-10-04 DIAGNOSIS — M79675 Pain in left toe(s): Secondary | ICD-10-CM | POA: Diagnosis not present

## 2020-10-04 DIAGNOSIS — M79674 Pain in right toe(s): Secondary | ICD-10-CM | POA: Diagnosis not present

## 2020-10-04 DIAGNOSIS — L989 Disorder of the skin and subcutaneous tissue, unspecified: Secondary | ICD-10-CM | POA: Diagnosis not present

## 2020-10-04 NOTE — Progress Notes (Signed)
    Subjective: Patient is a 84 y.o. female presenting to the office today with a chief complaint of painful callus lesion(s) noted to the right foot that appeared a few months ago. She has not had any treatment for the lesion. Walking increases the pain.  Patient also complains of elongated, thickened nails that cause pain while ambulating in shoes. She is unable to trim her own nails. Patient presents today for further treatment and evaluation.  Past Medical History:  Diagnosis Date  . Allergy    seasonal & dust  . Anxiety   . Arthritis    DJD  . Family history of adverse reaction to anesthesia    n/v   . GERD (gastroesophageal reflux disease)   . History of vertigo   . Hyperlipidemia   . Hypertension   . Hypothyroidism   . PONV (postoperative nausea and vomiting)   . Thyroid disease    hypothyroidism post Hashimoto's & partial thyroidectomy    Objective:  Physical Exam General: Alert and oriented x3 in no acute distress  Dermatology: Hyperkeratotic lesion(s) present on the right foot. Pain on palpation with a central nucleated core noted. Skin is warm, dry and supple bilateral lower extremities. Negative for open lesions or macerations. Nails are tender, long, thickened and dystrophic with subungual debris, consistent with onychomycosis, 1-5 bilateral. No signs of infection noted.  Vascular: Palpable pedal pulses bilaterally. No edema or erythema noted. Capillary refill within normal limits.  Neurological: Epicritic and protective threshold grossly intact bilaterally.   Musculoskeletal Exam: Pain on palpation at the keratotic lesion(s) noted. Range of motion within normal limits bilateral. Muscle strength 5/5 in all groups bilateral.  Assessment: 1. Onychodystrophic nails 1-5 bilateral with hyperkeratosis of nails.  2. Onychomycosis of nail due to dermatophyte bilateral 3. Pre-ulcerative callus lesion noted to the right foot    Plan of Care:  1. Patient evaluated. 2.  Excisional debridement of keratoic lesion(s) using a chisel blade was performed without incident.  3. Dressed with light dressing. 4. Mechanical debridement of nails 1-5 bilaterally performed using a nail nipper. Filed with dremel without incident.  5. Patient is to return to the clinic in 3 months.   Edrick Kins, DPM [facility]  Dr. Edrick Kins, [address]                                        [place], [place] [address]                Office [phone]  Fax [phone]

## 2020-10-17 DIAGNOSIS — H6123 Impacted cerumen, bilateral: Secondary | ICD-10-CM | POA: Diagnosis not present

## 2020-10-17 DIAGNOSIS — R42 Dizziness and giddiness: Secondary | ICD-10-CM | POA: Diagnosis not present

## 2020-10-17 DIAGNOSIS — H903 Sensorineural hearing loss, bilateral: Secondary | ICD-10-CM | POA: Diagnosis not present

## 2020-10-23 ENCOUNTER — Emergency Department (HOSPITAL_COMMUNITY): Payer: Medicare Other

## 2020-10-23 ENCOUNTER — Other Ambulatory Visit: Payer: Self-pay

## 2020-10-23 ENCOUNTER — Emergency Department (HOSPITAL_COMMUNITY)
Admission: EM | Admit: 2020-10-23 | Discharge: 2020-10-24 | Disposition: A | Discharge status: Home / Self Care | Payer: Medicare Other | Attending: Emergency Medicine | Admitting: Emergency Medicine

## 2020-10-23 ENCOUNTER — Other Ambulatory Visit: Payer: Self-pay | Admitting: Family Medicine

## 2020-10-23 DIAGNOSIS — M542 Cervicalgia: Secondary | ICD-10-CM | POA: Diagnosis not present

## 2020-10-23 DIAGNOSIS — Z79899 Other long term (current) drug therapy: Secondary | ICD-10-CM | POA: Diagnosis not present

## 2020-10-23 DIAGNOSIS — R110 Nausea: Secondary | ICD-10-CM | POA: Diagnosis not present

## 2020-10-23 DIAGNOSIS — Z96612 Presence of left artificial shoulder joint: Secondary | ICD-10-CM | POA: Diagnosis not present

## 2020-10-23 DIAGNOSIS — R9082 White matter disease, unspecified: Secondary | ICD-10-CM | POA: Diagnosis not present

## 2020-10-23 DIAGNOSIS — E039 Hypothyroidism, unspecified: Secondary | ICD-10-CM | POA: Insufficient documentation

## 2020-10-23 DIAGNOSIS — R42 Dizziness and giddiness: Secondary | ICD-10-CM | POA: Diagnosis not present

## 2020-10-23 DIAGNOSIS — I10 Essential (primary) hypertension: Secondary | ICD-10-CM | POA: Insufficient documentation

## 2020-10-23 DIAGNOSIS — R519 Headache, unspecified: Secondary | ICD-10-CM | POA: Insufficient documentation

## 2020-10-23 DIAGNOSIS — I6782 Cerebral ischemia: Secondary | ICD-10-CM | POA: Diagnosis not present

## 2020-10-23 LAB — CBC
HCT: 37.6 % (ref 36.0–46.0)
Hemoglobin: 12.5 g/dL (ref 12.0–15.0)
MCH: 30.7 pg (ref 26.0–34.0)
MCHC: 33.2 g/dL (ref 30.0–36.0)
MCV: 92.4 fL (ref 80.0–100.0)
Platelets: 233 10*3/uL (ref 150–400)
RBC: 4.07 MIL/uL (ref 3.87–5.11)
RDW: 15.9 % — ABNORMAL HIGH (ref 11.5–15.5)
WBC: 6.5 10*3/uL (ref 4.0–10.5)
nRBC: 0 % (ref 0.0–0.2)

## 2020-10-23 LAB — BASIC METABOLIC PANEL
Anion gap: 11 (ref 5–15)
BUN: 10 mg/dL (ref 8–23)
CO2: 28 mmol/L (ref 22–32)
Calcium: 8.9 mg/dL (ref 8.9–10.3)
Chloride: 103 mmol/L (ref 98–111)
Creatinine, Ser: 0.68 mg/dL (ref 0.44–1.00)
GFR, Estimated: >60 mL/min (ref >60)
Glucose, Bld: 126 mg/dL — ABNORMAL HIGH (ref 70–99)
Potassium: 3.5 mmol/L (ref 3.5–5.1)
Sodium: 142 mmol/L (ref 135–145)

## 2020-10-23 LAB — URINALYSIS, ROUTINE W REFLEX MICROSCOPIC
Bilirubin Urine: NEGATIVE
Glucose, UA: NEGATIVE mg/dL
Hgb urine dipstick: NEGATIVE
Ketones, ur: 5 mg/dL — AB
Leukocytes,Ua: NEGATIVE
Nitrite: NEGATIVE
Protein, ur: NEGATIVE mg/dL
Specific Gravity, Urine: 1.017 (ref 1.005–1.030)
pH: 6 (ref 5.0–8.0)

## 2020-10-23 LAB — CBG MONITORING, ED: Glucose-Capillary: 99 mg/dL (ref 70–99)

## 2020-10-23 MED ORDER — SODIUM CHLORIDE 0.9 % IV BOLUS
500.0000 mL | Freq: Once | INTRAVENOUS | Status: AC
  Administered 2020-10-23: 500 mL via INTRAVENOUS

## 2020-10-23 MED ORDER — KETOROLAC TROMETHAMINE 30 MG/ML IJ SOLN
15.0000 mg | Freq: Once | INTRAMUSCULAR | Status: AC
  Administered 2020-10-23: 15 mg via INTRAVENOUS
  Filled 2020-10-23: qty 1

## 2020-10-23 NOTE — Telephone Encounter (Signed)
Last OV 05/10/2020 Last refill 06/26/20      30 caps / 0 refill

## 2020-10-23 NOTE — ED Notes (Signed)
Pt transported to MRI 

## 2020-10-23 NOTE — ED Provider Notes (Signed)
[facility] EMERGENCY DEPARTMENT Provider Note  CSN: [id number] Arrival date & time: 10/23/20 1109    History Chief Complaint  Patient presents with  . Headache    HPI  Tasha Moss is a 84 y.o. female reports for the last 10-14 days she has had intermittent frontal headaches, dizziness and nausea, sometimes like the room is spinning, one episode of vomiting. She has also had mild-moderate aching pain in L neck that she attributes to prior neck surgery. She has been taking dramamine and meclizine without much improvement. She has not had a fever although her husband says she 'felt warm' last night. She did not have any diarrhea, constipation or dysuria. No CP, SOB or cough. She has been to her ENT doctor for a regular ear cleaning last week and was given lorazepam for vertigo but this only made her sleepy.    Past Medical History:  Diagnosis Date  . Allergy    seasonal & dust  . Anxiety   . Arthritis    DJD  . Family history of adverse reaction to anesthesia    n/v   . GERD (gastroesophageal reflux disease)   . History of vertigo   . Hyperlipidemia   . Hypertension   . Hypothyroidism   . PONV (postoperative nausea and vomiting)   . Thyroid disease    hypothyroidism post Hashimoto's & partial thyroidectomy    Past Surgical History:  Procedure Laterality Date  . CATARACT EXTRACTION W/ INTRAOCULAR LENS  IMPLANT, BILATERAL    . COLONOSCOPY     diverticulosis  . [place]   THR bilaterally   . lumbar disc repair  1989   Dr Arrie Aran  . SPINE SURGERY     cervical ; Dr Vertell Limber  . THYROIDECTOMY, PARTIAL     benign nodule  . TONSILLECTOMY AND ADENOIDECTOMY    . TOTAL SHOULDER ARTHROPLASTY Left 02/27/2016   Procedure: LEFT REVERSE TOTAL SHOULDER ARTHROPLASTY;  Surgeon: Renette Butters, MD;  Location: [facility];  Service: Orthopedics;  Laterality: Left;  . vitreous detachment     Dr Claudean Kinds    Family History  Problem Relation Age of Onset  .  Diabetes Mother   . Hepatitis Mother        Hepatitis B,? etiology  . Miscarriages / [place] Mother   . Stroke Mother 8  . Heart disease Father        CHF  . Cancer Paternal Aunt        GI cancers  . Heart disease Paternal Aunt        CHF  . Cancer Paternal Uncle        lung, GI  . Heart disease Paternal Uncle        CHF    Social History   Tobacco Use  . Smoking status: Never Smoker  . Smokeless tobacco: Never Used  Vaping Use  . Vaping Use: Never used  Substance Use Topics  . Alcohol use: No  . Drug use: No     Home Medications Prior to Admission medications   Medication Sig Start Date End Date Taking? Authorizing Provider  acetaminophen (TYLENOL) 500 MG tablet Take 500 mg by mouth every 6 (six) hours as needed.   Yes [provider]  chlorhexidine (PERIDEX) 0.12 % solution Use as directed 15 mLs in the mouth or throat 2 (two) times daily.  04/13/20  Yes [provider]  Cholecalciferol (VITAMIN D3) 1000 UNITS CAPS Take 1,000 Units  by mouth daily.     Yes [provider]  Cranberry 200 MG CAPS Take 1 capsule by mouth daily.   Yes [provider]  EUTHYROX 125 MCG tablet TAKE 1 TABLET BY MOUTH ONCE DAILY BEFORE BREAKFAST Patient taking differently: Take 125 mcg by mouth daily before breakfast.  10/02/20  Yes [name], MD  fenofibrate 160 MG tablet Take 1 tablet by mouth once daily 08/31/20  Yes [name], MD  FLUoxetine (PROZAC) 20 MG capsule Take 1 capsule by mouth once daily 10/23/20  Yes [name], MD  glucosamine-chondroitin 500-400 MG tablet Take 1 tablet by mouth every other day.    Yes [provider]  loratadine (CLARITIN) 10 MG tablet Take 10 mg by mouth daily.     Yes [provider]  meloxicam (MOBIC) 7.5 MG tablet Take 1 tablet by mouth twice daily 09/29/20  Yes [name], MD  Multiple Vitamin (MULTIVITAMIN) tablet Take 1 tablet by mouth daily.     Yes [provider]  pantoprazole (PROTONIX) 40 MG tablet Take 1 tablet by mouth once daily 09/05/20  Yes [name], MD  pravastatin (PRAVACHOL) 40 MG tablet TAKE 1 TABLET BY MOUTH ONCE DAILY AT BEDTIME FOR CHOLESTEROL Patient taking differently: Take 40 mg by mouth at bedtime.  09/29/20  Yes [name], MD  vitamin B-12 (CYANOCOBALAMIN) 250 MCG tablet Take 250 mcg by mouth daily.     Yes [provider]  NEOMYCIN-POLYMYXIN-HYDROCORTISONE (CORTISPORIN) 1 % SOLN OTIC solution Apply one to two drops to the affected toe after soaking twice daily. Patient not taking: Reported on 10/23/2020 01/06/20   Wallene Huh, DPM     Allergies    Gabapentin, Other, and Morphine   Review of Systems   Review of Systems A comprehensive review of systems was completed and negative except as noted in HPI.    Physical Exam BP (!) 162/70   Pulse 65   Temp (!) 97.5 F (36.4 C)   Resp 18   Ht 5\' 5"  (1.651 m)   Wt 68 kg   SpO2 95%   BMI 24.96 kg/m   Physical Exam Vitals and nursing note reviewed.  Constitutional:      Appearance: Normal appearance.  HENT:     Head: Normocephalic and atraumatic.     Nose: Nose normal.     Mouth/Throat:     Mouth: Mucous membranes are moist.  Eyes:     Extraocular Movements: Extraocular movements intact.     Conjunctiva/sclera: Conjunctivae normal.  Cardiovascular:     Rate and Rhythm: Normal rate.  Pulmonary:     Effort: Pulmonary effort is normal.     Breath sounds: Normal breath sounds.  Abdominal:     General: Abdomen is flat.     Palpations: Abdomen is soft.     Tenderness: There is no abdominal tenderness.  Musculoskeletal:        General: No swelling. Normal range of motion.     Cervical back: Neck supple.  Skin:    General: Skin is warm and dry.  Neurological:     General: No focal deficit present.     Mental Status: She is alert.  Psychiatric:        Mood and Affect: Mood normal.      ED Results / Procedures /  Treatments   Labs (all labs ordered are listed, but only abnormal results are displayed) Labs Reviewed  BASIC METABOLIC PANEL -  Abnormal; Notable for the following components:      Result Value   Glucose, Bld 126 (*)    All other components within normal limits  CBC - Abnormal; Notable for the following components:   RDW 15.9 (*)    All other components within normal limits  URINALYSIS, ROUTINE W REFLEX MICROSCOPIC - Abnormal; Notable for the following components:   Ketones, ur 5 (*)    All other components within normal limits  CBG MONITORING, ED    EKG EKG Interpretation  Date/Time:  Monday October 23 2020 21:32:47 EST Ventricular Rate:  70 PR Interval:    QRS Duration: 93 QT Interval:  434 QTC Calculation: 469 R Axis:   -10 Text Interpretation: Sinus rhythm Normal ECG No significant change since last tracing Confirmed by Calvert Cantor [id number]) on 10/23/2020 9:35:28 PM   Radiology CT HEAD WO CONTRAST  Result Date: 10/23/2020 CLINICAL DATA:  Dizziness, nonspecific. EXAM: CT HEAD WITHOUT CONTRAST TECHNIQUE: Contiguous axial images were obtained from the base of the skull through the vertex without intravenous contrast. COMPARISON:  MRI head 08/13/2005, MRA head 08/14/2005. Head CT 08/12/2005. FINDINGS: Brain: Cerebral volume is normal for age. Mild ill-defined hypoattenuation within the cerebral white matter is nonspecific, but compatible with chronic small vessel ischemic disease. There is no acute intracranial hemorrhage. No demarcated cortical infarct. No extra-axial fluid collection. No evidence of intracranial mass. No midline shift. Partially empty sella turcica. Vascular: No hyperdense vessel.  Atherosclerotic calcifications. Skull: Normal. Negative for fracture or focal lesion. Sinuses/Orbits: Visualized orbits show no acute finding. No significant paranasal sinus disease at the imaged levels. IMPRESSION: No evidence of acute intracranial abnormality. Mild cerebral white  matter chronic small vessel ischemic disease. Electronically Signed   By: Kellie Simmering DO   On: 10/23/2020 12:32    Procedures Procedures  Medications Ordered in the ED Medications  sodium chloride 0.9 % bolus 500 mL (500 mLs Intravenous New Bag/Given 10/23/20 2148)  ketorolac (TORADOL) 30 MG/ML injection 15 mg (15 mg Intravenous Given 10/23/20 2148)     MDM Rules/Calculators/A&P MDM Patient's labs and CT from triage reviewed and no concerning findings. Given her persistent symptoms, will send for MRI to rule out posterior circulation stroke. IVF, toradol for symptom improvement.  ED Course  I have reviewed the triage vital signs and the nursing notes.  Pertinent labs & imaging results that were available during my care of the patient were reviewed by me and considered in my medical decision making (see chart for details).  Clinical Course as of Oct 23 2326  Mon Oct 23, 2020  2326 Care of the patient signed out to Dr. Betsey Holiday pending MRI   [CS]    Clinical Course User Index [CS] Truddie Hidden, MD    Final Clinical Impression(s) / ED Diagnoses Final diagnoses:  None    Rx / DC Orders ED Discharge Orders    None       Truddie Hidden, MD 10/23/20 [phone]

## 2020-10-23 NOTE — ED Triage Notes (Addendum)
Pt reports head pain, neck pain,weakness, dizziness for about a week . Pt also reports facial swelling.Pt reports being sensitive to light.Pt reports throwing up two weeks ago, and remains nauseous. Pt also reports being sensitive to medications.

## 2020-10-24 ENCOUNTER — Encounter: Payer: Self-pay | Admitting: Neurology

## 2020-10-24 MED ORDER — ONDANSETRON HCL 4 MG/2ML IJ SOLN
4.0000 mg | Freq: Once | INTRAMUSCULAR | Status: AC
  Administered 2020-10-24: 4 mg via INTRAVENOUS
  Filled 2020-10-24: qty 2

## 2020-10-24 MED ORDER — ONDANSETRON HCL 4 MG PO TABS: 4.0000 mg | ORAL_TABLET | Freq: Four times a day (QID) | ORAL | 3 refills | Status: DC | PRN

## 2020-10-24 NOTE — ED Notes (Signed)
Pt returned from MRI °

## 2020-10-24 NOTE — ED Notes (Signed)
Patient verbalizes understanding of discharge instructions. Prescriptions reviewed. Opportunity for questioning and answers were provided. Armband removed by staff, pt discharged from ED via wheelchair with daughter.

## 2020-10-24 NOTE — ED Provider Notes (Signed)
Patient signed out to me by Dr. Karle Starch to follow-up on MRI.  MRI shows chronic microvascular disease but no acute findings that would explain her headaches and dizziness.  It sounds as though her dizziness has been chronic and lifelong, attributed to "motion sickness".  Her headaches have been more recent and are associated with some light sensitivity.  She does not have a history of migraines.  Blood pressure is noted to be elevated here, unclear how this relates to the headache.  She normally has normal blood pressure, has never required blood pressure medication.  Discussed with patient and her daughter need for follow-up with primary care to determine if her blood pressure needs to be treated but would not initiate treatment currently.  Additionally will refer to neurology for further evaluation and treatment of her headaches.  Will provide prescription for Zofran for the nausea she is experiencing.   Orpah Greek, MD 10/24/20 [phone]

## 2020-10-26 ENCOUNTER — Ambulatory Visit: Payer: Medicare Other | Admitting: Family Medicine

## 2020-10-31 ENCOUNTER — Encounter: Payer: Self-pay | Admitting: Family Medicine

## 2020-10-31 ENCOUNTER — Other Ambulatory Visit: Payer: Self-pay

## 2020-10-31 ENCOUNTER — Ambulatory Visit (INDEPENDENT_AMBULATORY_CARE_PROVIDER_SITE_OTHER): Payer: Medicare Other | Admitting: Family Medicine

## 2020-10-31 VITALS — BP 150/80 | HR 70 | Temp 97.2°F | Resp 15 | Ht 65.0 in | Wt 156.5 lb

## 2020-10-31 DIAGNOSIS — R42 Dizziness and giddiness: Secondary | ICD-10-CM | POA: Diagnosis not present

## 2020-10-31 DIAGNOSIS — J3489 Other specified disorders of nose and nasal sinuses: Secondary | ICD-10-CM | POA: Diagnosis not present

## 2020-10-31 DIAGNOSIS — R519 Headache, unspecified: Secondary | ICD-10-CM | POA: Diagnosis not present

## 2020-10-31 DIAGNOSIS — R110 Nausea: Secondary | ICD-10-CM | POA: Diagnosis not present

## 2020-10-31 DIAGNOSIS — R030 Elevated blood-pressure reading, without diagnosis of hypertension: Secondary | ICD-10-CM | POA: Diagnosis not present

## 2020-10-31 NOTE — Progress Notes (Signed)
   Subjective:    Patient ID: Tasha Moss, female    DOB: [birth year], 84 y.o.   MRN: [id number]  HPI ER f/u- pt went to ER on 11/15 w/ HA, dizziness and nausea.  At that time, it was noted she had elevated BP.  Her MRI was unrevealing.  Was given Zofran for nausea and was referred to Neurology for HA and dizziness.  That appt is scheduled w/ Dr Tomi Likens on 1/28.  Since the ER, BP readings have ranged 152-188/73-89.  Pt states that the story begins in October when she had an eye exam.  Dr Satira Sark wanted her to start Preservision BID.  Pt reports after starting medication she developed HAs- which she takes Tylenol for.  Had appt to get ears cleaned (which she does q6 months).  She had hearing test due to HA and dizziness which was basically unchanged.  Dr Benjamine Mola wanted her to undergo balance testing but pt stated she would not be able to 'go through it' so she canceled it.  She then developed tooth pain with her nausea and severe headache- CT and MRI were both negative for sinus infxn.  Pt reports headaches have improved.  Minimal sensitivity to light and sound at this point.  Only mild dizziness at this time.   Review of Systems For ROS see HPI   This visit occurred during the SARS-CoV-2 public health emergency.  Safety protocols were in place, including screening questions prior to the visit, additional usage of staff PPE, and extensive cleaning of exam room while observing appropriate contact time as indicated for disinfecting solutions.       Objective:   Physical Exam Vitals reviewed.  Constitutional:      General: She is not in acute distress.    Appearance: Normal appearance. She is well-developed. She is not ill-appearing.  HENT:     Head: Normocephalic and atraumatic.  Eyes:     Conjunctiva/sclera: Conjunctivae normal.     Pupils: Pupils are equal, round, and reactive to light.  Neck:     Thyroid: No thyromegaly.  Cardiovascular:     Rate and Rhythm: Normal rate and regular rhythm.      Heart sounds: Normal heart sounds. No murmur heard.   Pulmonary:     Effort: Pulmonary effort is normal. No respiratory distress.     Breath sounds: Normal breath sounds.  Abdominal:     General: There is no distension.     Palpations: Abdomen is soft.     Tenderness: There is no abdominal tenderness.  Musculoskeletal:     Cervical back: Normal range of motion and neck supple.  Lymphadenopathy:     Cervical: No cervical adenopathy.  Skin:    General: Skin is warm and dry.  Neurological:     Mental Status: She is alert and oriented to person, place, and time.  Psychiatric:        Behavior: Behavior normal.           Assessment & Plan:  HA/sinus pain/nausea- new.  I suspect that this constellation of sxs (including dizziness) is consistent w/ sinus inflammation/allergies.  When I mentioned this, pt stated that makes sense as she has felt this way previously and was dx'd w/ allergies.  Encouraged her to continue her daily antihistamine and add nasal steroid until sxs resolve.  Reviewed supportive care and red flags that should prompt return.  Pt expressed understanding and is in agreement w/ plan.

## 2020-10-31 NOTE — Patient Instructions (Signed)
Follow up in 10-14 days to recheck Bp No need for lab work today- yay!!! I suspect your facial pain, tooth pain, headache, nausea, and dizziness are all due to sinus inflammation Make sure you are taking your allergy medication daily and restart the Flonase Drink plenty of fluids I suspect your BP elevation is due to stress- try and relax and that will improve your blood pressure Call with any questions or concerns Happy Thanksgiving!!!

## 2020-10-31 NOTE — Assessment & Plan Note (Signed)
New.  I suspect that her dizziness, headache, facial pain, tooth pain, and nausea are all due to untreated allergies/sinus inflammation.  She feels much better today.  Encouraged her to continue her daily allergy medication and add Flonase until symptoms resolve.  Pt expressed understanding and is in agreement w/ plan.
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
## 2020-11-14 ENCOUNTER — Other Ambulatory Visit: Payer: Self-pay

## 2020-11-14 ENCOUNTER — Ambulatory Visit (INDEPENDENT_AMBULATORY_CARE_PROVIDER_SITE_OTHER): Payer: Medicare Other | Admitting: Family Medicine

## 2020-11-14 ENCOUNTER — Encounter: Payer: Self-pay | Admitting: Family Medicine

## 2020-11-14 VITALS — BP 145/70 | HR 70 | Temp 97.0°F | Resp 15 | Ht 62.0 in | Wt 154.8 lb

## 2020-11-14 DIAGNOSIS — E782 Mixed hyperlipidemia: Secondary | ICD-10-CM | POA: Diagnosis not present

## 2020-11-14 DIAGNOSIS — Z0000 Encounter for general adult medical examination without abnormal findings: Secondary | ICD-10-CM

## 2020-11-14 LAB — HEPATIC FUNCTION PANEL
ALT: 10 U/L (ref 0–35)
AST: 16 U/L (ref 0–37)
Albumin: 3.9 g/dL (ref 3.5–5.2)
Alkaline Phosphatase: 40 U/L (ref 39–117)
Bilirubin, Direct: 0.1 mg/dL (ref 0.0–0.3)
Total Bilirubin: 0.6 mg/dL (ref 0.2–1.2)
Total Protein: 6.2 g/dL (ref 6.0–8.3)

## 2020-11-14 LAB — CBC WITH DIFFERENTIAL/PLATELET
Basophils Absolute: 0 10*3/uL (ref 0.0–0.1)
Basophils Relative: 0.9 % (ref 0.0–3.0)
Eosinophils Absolute: 0.2 10*3/uL (ref 0.0–0.7)
Eosinophils Relative: 4.2 % (ref 0.0–5.0)
HCT: 35.6 % — ABNORMAL LOW (ref 36.0–46.0)
Hemoglobin: 12.2 g/dL (ref 12.0–15.0)
Lymphocytes Relative: 26.3 % (ref 12.0–46.0)
Lymphs Abs: 1.5 10*3/uL (ref 0.7–4.0)
MCHC: 34.4 g/dL (ref 30.0–36.0)
MCV: 89.4 fl (ref 78.0–100.0)
Monocytes Absolute: 0.5 10*3/uL (ref 0.1–1.0)
Monocytes Relative: 8.3 % (ref 3.0–12.0)
Neutro Abs: 3.4 10*3/uL (ref 1.4–7.7)
Neutrophils Relative %: 60.3 % (ref 43.0–77.0)
Platelets: 213 10*3/uL (ref 150.0–400.0)
RBC: 3.98 Mil/uL (ref 3.87–5.11)
RDW: 14.8 % (ref 11.5–15.5)
WBC: 5.6 10*3/uL (ref 4.0–10.5)

## 2020-11-14 LAB — LIPID PANEL
Cholesterol: 147 mg/dL (ref 0–200)
HDL: 50.3 mg/dL (ref >39.00)
LDL Cholesterol: 75 mg/dL (ref 0–99)
NonHDL: 97.12
Total CHOL/HDL Ratio: 3
Triglycerides: 110 mg/dL (ref 0.0–149.0)
VLDL: 22 mg/dL (ref 0.0–40.0)

## 2020-11-14 LAB — TSH: TSH: 0.9 u[IU]/mL (ref 0.35–4.50)

## 2020-11-14 LAB — BASIC METABOLIC PANEL
BUN: 13 mg/dL (ref 6–23)
CO2: 31 mEq/L (ref 19–32)
Calcium: 9 mg/dL (ref 8.4–10.5)
Chloride: 105 mEq/L (ref 96–112)
Creatinine, Ser: 0.57 mg/dL (ref 0.40–1.20)
GFR: 84.39 mL/min (ref >60.00)
Glucose, Bld: 93 mg/dL (ref 70–99)
Potassium: 3.8 mEq/L (ref 3.5–5.1)
Sodium: 143 mEq/L (ref 135–145)

## 2020-11-14 NOTE — Patient Instructions (Addendum)
Follow up in 6 months to recheck BP and cholesterol We'll notify you of your lab results and make any changes if needed No med changes at this time Call with any questions or concerns Stay Safe!  Stay Healthy! Happy Holidays!!

## 2020-11-14 NOTE — Progress Notes (Signed)
   Subjective:    Patient ID: Tasha Moss, female    DOB: [birth year], 84 y.o.   MRN: [id number]  HPI CPE- UTD on immunizations.  No longer doing colon cancer screenings.  Pt has not been doing mammograms and doesn't want to (due to chronic shoulder pain)  Reviewed past medical, surgical, family and social histories.   Patient Care Team    Relationship Specialty Notifications Start End  [name], MD PCP - General Family Medicine  06/29/14    Comment: Laurena Slimmer, Sandy Salaam, MD (Inactive) Consulting Physician Gastroenterology  06/29/14   Leta Baptist, MD Consulting Physician Otolaryngology  10/09/17   Erline Levine, MD Consulting Physician Neurosurgery  10/09/17   Renette Butters, MD Attending Physician Orthopedic Surgery  10/09/17   Harriett Sine, MD Consulting Physician Dermatology  10/09/17   Madelin Rear, [facility] Pharmacist Pharmacist  03/23/20    Comment: PHONE NUMBER [phone]    Health Maintenance  Topic Date Due  . TETANUS/TDAP  05/10/2021 (Originally 01/14/1955)  . INFLUENZA VACCINE  Completed  . DEXA SCAN  Completed  . COVID-19 Vaccine  Completed  . PNA vac Low Risk Adult  Completed      Review of Systems Patient reports no vision/ hearing changes, adenopathy,fever, weight change,  persistant/recurrent hoarseness, chest pain, palpitations, edema, persistant/recurrent cough, hemoptysis, dyspnea (rest/exertional/paroxysmal nocturnal), gastrointestinal bleeding (melena, rectal bleeding), abdominal pain, significant heartburn, bowel changes, GU symptoms (dysuria, hematuria, incontinence), Gyn symptoms (abnormal  bleeding, pain),  syncope, focal weakness, memory loss, numbness & tingling, skin/hair/nail changes, abnormal bruising or bleeding, anxiety, or depression.   + HAs- 'not as bad.  Mornings are the worst' + dysphagia w/ large pills and bread  This visit occurred during the SARS-CoV-2 public health emergency.  Safety protocols were in place, including screening  questions prior to the visit, additional usage of staff PPE, and extensive cleaning of exam room while observing appropriate contact time as indicated for disinfecting solutions.       Objective:   Physical Exam General Appearance:    Alert, cooperative, no distress, appears stated age  Head:    Normocephalic, without obvious abnormality, atraumatic  Eyes:    PERRL, conjunctiva/corneas clear, EOM's intact, fundi    benign, both eyes  Ears:    Normal TM's and external ear canals, both ears  Nose:   Deferred due to COVID  Throat:   Neck:   Supple, symmetrical, trachea midline, no adenopathy;    Thyroid: no enlargement/tenderness/nodules  Back:     Symmetric, no curvature, ROM normal, no CVA tenderness  Lungs:     Clear to auscultation bilaterally, respirations unlabored  Chest Wall:    No tenderness or deformity   Heart:    Regular rate and rhythm, S1 and S2 normal, no murmur, rub   or gallop  Breast Exam:    Deferred  Abdomen:     Soft, non-tender, bowel sounds active all four quadrants,    no masses, no organomegaly  Genitalia:    Deferred  Rectal:    Extremities:   Extremities normal, atraumatic, no cyanosis or edema  Pulses:   2+ and symmetric all extremities  Skin:   Skin color, texture, turgor normal, no rashes or lesions  Lymph nodes:   Cervical, supraclavicular, and axillary nodes normal  Neurologic:   CNII-XII intact          Assessment & Plan:

## 2020-11-14 NOTE — Assessment & Plan Note (Signed)
Pt's PE unchanged from previous.  No longer doing colonoscopy or mammogram.  UTD on vaccines.  Check labs.  Anticipatory guidance provided.

## 2020-11-14 NOTE — Assessment & Plan Note (Signed)
Chronic problem.  Tolerating statin.  Check labs.  Adjust meds prn  

## 2020-11-28 ENCOUNTER — Other Ambulatory Visit: Payer: Self-pay | Admitting: Family Medicine

## 2020-12-11 ENCOUNTER — Telehealth: Payer: Self-pay | Admitting: Family Medicine

## 2020-12-11 NOTE — Telephone Encounter (Signed)
Patient would like his pharmacy changed to [facility].

## 2020-12-11 NOTE — Telephone Encounter (Signed)
Pharmacy has been changed

## 2020-12-13 ENCOUNTER — Other Ambulatory Visit: Payer: Self-pay

## 2020-12-13 MED ORDER — LEVOTHYROXINE SODIUM 125 MCG PO TABS
ORAL_TABLET | ORAL | 0 refills | Status: DC
Start: 2020-12-13 — End: 2021-02-05

## 2020-12-15 ENCOUNTER — Other Ambulatory Visit: Payer: Self-pay

## 2020-12-15 MED ORDER — PRAVASTATIN SODIUM 40 MG PO TABS
ORAL_TABLET | ORAL | 0 refills | Status: DC
Start: 2020-12-15 — End: 2021-01-01

## 2020-12-15 MED ORDER — PANTOPRAZOLE SODIUM 40 MG PO TBEC
40.0000 mg | DELAYED_RELEASE_TABLET | Freq: Every day | ORAL | 0 refills | Status: DC
Start: 2020-12-15 — End: 2021-03-06

## 2020-12-15 MED ORDER — FENOFIBRATE 160 MG PO TABS
160.0000 mg | ORAL_TABLET | Freq: Every day | ORAL | 0 refills | Status: DC
Start: 2020-12-15 — End: 2021-02-26

## 2020-12-15 MED ORDER — MELOXICAM 7.5 MG PO TABS
7.5000 mg | ORAL_TABLET | Freq: Two times a day (BID) | ORAL | 0 refills | Status: DC
Start: 2020-12-15 — End: 2020-12-26

## 2020-12-15 MED ORDER — FLUOXETINE HCL 20 MG PO CAPS
20.0000 mg | ORAL_CAPSULE | Freq: Every day | ORAL | 0 refills | Status: DC
Start: 2020-12-15 — End: 2021-02-19

## 2020-12-15 NOTE — Telephone Encounter (Signed)
Meloxicam LFD 09/29/20 #180 with no refills LOV 11/14/20 NOV 05/15/21

## 2020-12-15 NOTE — Progress Notes (Signed)
Subjective:   Tasha Moss is a 85 y.o. female who presents for Medicare Annual (Subsequent) preventive examination.  I connected with Sophina today by telephone and verified that I am speaking with the correct person using two identifiers. Location patient: home Location provider: work Persons participating in the virtual visit: patient, Marine scientist.    I discussed the limitations, risks, security and privacy concerns of performing an evaluation and management service by telephone and the availability of in person appointments. I also discussed with the patient that there may be a patient responsible charge related to this service. The patient expressed understanding and verbally consented to this telephonic visit.    Interactive audio and video telecommunications were attempted between this provider and patient, however failed, due to patient having technical difficulties OR patient did not have access to video capability.  We continued and completed visit with audio only.  Some vital signs may be absent or patient reported.   Time Spent with patient on telephone encounter: 30 minutes   Review of Systems     Cardiac Risk Factors include: advanced age (>59men, >18 women);dyslipidemia;sedentary lifestyle     Objective:    Today's Vitals   12/18/20 1416  Weight: 154 lb (69.9 kg)  Height: 5\' 2"  (1.575 m)   Body mass index is 28.17 kg/m.  Advanced Directives 12/18/2020 10/23/2020 11/10/2019 10/09/2017 03/08/2016 03/01/2016 02/19/2016  Does Patient Have a Medical Advance Directive? Yes No Yes Yes Yes Yes Yes  Type of Paramedic of [place];Living will - Living will;Healthcare Power of [place];Living will Healthcare Power of [place];Living will [place];Living will  Does patient want to make changes to medical advance directive? - - No - Patient declined - No - Patient declined No - Patient  declined No - Patient declined  Copy of [place] in Chart? Yes - validated most recent copy scanned in chart (See row information) - Yes - validated most recent copy scanned in chart (See row information) Yes No - copy requested No - copy requested No - copy requested  Would patient like information on creating a medical advance directive? - No - Patient declined - - - - -    Current Medications (verified) Outpatient Encounter Medications as of 12/18/2020  Medication Sig  . acetaminophen (TYLENOL) 500 MG tablet Take 500 mg by mouth every 6 (six) hours as needed.  . chlorhexidine (PERIDEX) 0.12 % solution Use as directed 15 mLs in the mouth or throat 2 (two) times daily.   . Cholecalciferol (VITAMIN D3) 1000 UNITS CAPS Take 1,000 Units by mouth daily.  . Cranberry 200 MG CAPS Take 1 capsule by mouth daily.  . fenofibrate 160 MG tablet Take 1 tablet (160 mg total) by mouth daily.  Marland Kitchen FLUoxetine (PROZAC) 20 MG capsule Take 1 capsule (20 mg total) by mouth daily.  Marland Kitchen glucosamine-chondroitin 500-400 MG tablet Take 1 tablet by mouth every other day.   . levothyroxine (EUTHYROX) 125 MCG tablet TAKE 1 TABLET BY MOUTH ONCE DAILY BEFORE BREAKFAST  . loratadine (CLARITIN) 10 MG tablet Take 10 mg by mouth daily.  . meloxicam (MOBIC) 7.5 MG tablet Take 1 tablet (7.5 mg total) by mouth 2 (two) times daily.  . Multiple Vitamin (MULTIVITAMIN) tablet Take 1 tablet by mouth daily.  . pantoprazole (PROTONIX) 40 MG tablet Take 1 tablet (40 mg total) by mouth daily.  . pravastatin (PRAVACHOL) 40 MG tablet TAKE 1 TABLET BY  MOUTH ONCE DAILY AT BEDTIME FOR CHOLESTEROL  . vitamin B-12 (CYANOCOBALAMIN) 250 MCG tablet Take 250 mcg by mouth daily.  . [DISCONTINUED] fenofibrate 160 MG tablet Take 1 tablet by mouth once daily  . [DISCONTINUED] FLUoxetine (PROZAC) 20 MG capsule Take 1 capsule by mouth once daily  . [DISCONTINUED] meloxicam (MOBIC) 7.5 MG tablet Take 1 tablet by mouth twice daily  .  [DISCONTINUED] pantoprazole (PROTONIX) 40 MG tablet Take 1 tablet by mouth once daily  . [DISCONTINUED] pravastatin (PRAVACHOL) 40 MG tablet TAKE 1 TABLET BY MOUTH ONCE DAILY AT BEDTIME FOR CHOLESTEROL (Patient taking differently: Take 40 mg by mouth at bedtime. )   No facility-administered encounter medications on file as of 12/18/2020.    Allergies (verified) Gabapentin, Other, and Morphine   History: Past Medical History:  Diagnosis Date  . Allergy    seasonal & dust  . Anxiety   . Arthritis    DJD  . Family history of adverse reaction to anesthesia    n/v   . GERD (gastroesophageal reflux disease)   . History of vertigo   . Hyperlipidemia   . Hypertension   . Hypothyroidism   . PONV (postoperative nausea and vomiting)   . Thyroid disease    hypothyroidism post Hashimoto's & partial thyroidectomy   Past Surgical History:  Procedure Laterality Date  . CATARACT EXTRACTION W/ INTRAOCULAR LENS  IMPLANT, BILATERAL    . COLONOSCOPY     diverticulosis  . [place]   THR bilaterally   . lumbar disc repair  1989   Dr Arrie Aran  . SPINE SURGERY     cervical ; Dr Vertell Limber  . THYROIDECTOMY, PARTIAL     benign nodule  . TONSILLECTOMY AND ADENOIDECTOMY    . TOTAL SHOULDER ARTHROPLASTY Left 02/27/2016   Procedure: LEFT REVERSE TOTAL SHOULDER ARTHROPLASTY;  Surgeon: Renette Butters, MD;  Location: [facility];  Service: Orthopedics;  Laterality: Left;  . vitreous detachment     Dr Claudean Kinds   Family History  Problem Relation Age of Onset  . Diabetes Mother   . Hepatitis Mother        Hepatitis B,? etiology  . Miscarriages / [place] Mother   . Stroke Mother 32  . Heart disease Father        CHF  . Cancer Paternal Aunt        GI cancers  . Heart disease Paternal Aunt        CHF  . Cancer Paternal Uncle        lung, GI  . Heart disease Paternal Uncle        CHF   Social History   Socioeconomic History  . Marital status: Married    Spouse name:  Not on file  . Number of children: 2  . Years of education: Not on file  . Highest education level: Not on file  Occupational History  . Not on file  Tobacco Use  . Smoking status: Never Smoker  . Smokeless tobacco: Never Used  Vaping Use  . Vaping Use: Never used  Substance and Sexual Activity  . Alcohol use: No  . Drug use: No  . Sexual activity: Not on file  Other Topics Concern  . Not on file  Social History Narrative   Married for 20 years    Daughter-nurse   Daughter- Radio broadcast assistant    Social Determinants of Health   Financial Resource Strain: Low Risk   . Difficulty of Paying Living  Expenses: Not hard at all  Food Insecurity: No Food Insecurity  . Worried About Charity fundraiser in the Last Year: Never true  . Ran Out of Food in the Last Year: Never true  Transportation Needs: No Transportation Needs  . Lack of Transportation (Medical): No  . Lack of Transportation (Non-Medical): No  Physical Activity: Inactive  . Days of Exercise per Week: 0 days  . Minutes of Exercise per Session: 0 min  Stress: No Stress Concern Present  . Feeling of Stress : Not at all  Social Connections: Socially Integrated  . Frequency of Communication with Friends and Family: More than three times a week  . Frequency of Social Gatherings with Friends and Family: Once a week  . Attends Religious Services: More than 4 times per year  . Active Member of Clubs or Organizations: Yes  . Attends Archivist Meetings: 1 to 4 times per year  . Marital Status: Married    Tobacco Counseling Counseling given: Not Answered   Clinical Intake:  Pre-visit preparation completed: Yes  Pain : No/denies pain     Nutritional Status: BMI of 19-24  Normal Nutritional Risks: None Diabetes: No  How often do you need to have someone help you when you read instructions, pamphlets, or other written materials from your doctor or pharmacy?: 1 - Never  Diabetic?No  Interpreter Needed?:  No  Information entered by :: Caroleen Hamman LPN   Activities of Daily Living In your present state of health, do you have any difficulty performing the following activities: 12/18/2020 11/14/2020  Hearing? Y N  Comment bilateral hearing aids -  Vision? N N  Difficulty concentrating or making decisions? N N  Walking or climbing stairs? N N  Dressing or bathing? N N  Doing errands, shopping? N N  Preparing Food and eating ? N -  Using the Toilet? N -  In the past six months, have you accidently leaked urine? Y -  Do you have problems with loss of bowel control? N -  Managing your Medications? N -  Managing your Finances? N -  Housekeeping or managing your Housekeeping? N -  Some recent data might be hidden    Patient Care Team: [name], MD as PCP - General (Family Medicine) Inda Castle, MD (Inactive) as Consulting Physician (Gastroenterology) Leta Baptist, MD as Consulting Physician (Otolaryngology) Erline Levine, MD as Consulting Physician (Neurosurgery) Renette Butters, MD as Attending Physician (Orthopedic Surgery) Harriett Sine, MD as Consulting Physician (Dermatology) Madelin Rear, [facility] as Pharmacist (Pharmacist)  Indicate any recent Medical Services you may have received from other than [facility] providers in the past year (date may be approximate).     Assessment:   This is a routine wellness examination for [place].  Hearing/Vision screen  Hearing Screening   125Hz  250Hz  500Hz  1000Hz  2000Hz  3000Hz  4000Hz  6000Hz  8000Hz   Right ear:           Left ear:           Comments: Bilateral hearing aids  Vision Screening Comments: Wears glasses Last eye exam-2021  Dietary issues and exercise activities discussed: Exercise limited by: Other - see comments (balance problems)  Goals    . Patient Stated     Maintain independence      . Patient Stated     Drink more water    . PharmD Care Plan     CARE PLAN ENTRY  Current Barriers:  . Chronic Disease  Management support,  education, and care coordination needs related to Hyperlipidemia, Gastroesophageal Reflux Disease, Hypothyroidism, Depression, and RLS.  Hyperlipidemia . Pharmacist Clinical Goal(s): o Over the next 180 days, patient will work with PharmD and providers to maintain LDL goal < 100 . Current regimen:  o Pravastatin 40 mg daily  o Fenofibrate 160 mg daily  . Interventions: o Continue current management . Patient self care activities - Over the next 180 days, patient will: o Continue current management Hypothyroidism . Pharmacist Clinical Goal(s) o Over the next 180 days, patient will work with PharmD and providers to maintain thyroid stimulating hormone in normal range. . Current regimen:  o Euthyrox 125 mcg once daily before breakfast . Interventions: o Counseled on appropriate use . Patient self care activities - Over the next 180 days, patient will: o Continue current management  Depression/Anxiety . Pharmacist Clinical Goal(s) o Over the next 180 days, patient will work with PharmD and providers to minimize depression/anxiety symptoms . Current regimen: o Fluoxetine 20 mh once daily  . Interventions: o Continue current management  . Patient self care activities - Over the next 180 days, patient will: o Continue current management GERD (acid reflux) . Pharmacist Clinical Goal(s) o Over the next 180 days, patient will work with PharmD and providers to minimize GERD symptoms. . Current regimen:  o Pantoprazole 40 mg daily. . Interventions: o Continue current management . Patient self care activities - Over the next 180 days, patient will: o Continue current medication and avoid triggers discussed including mint, caffeine, chocolate, fried foods.   Medication management . Pharmacist Clinical Goal(s): o Over the next 180 days, patient will work with PharmD and providers to maintain optimal medication adherence . Current pharmacy:  [facility] . Interventions o Comprehensive medication review performed. o Continue current medication management strategy . Patient self care activities - Over the next 180 days, patient will: o Focus on medication adherence by Continue current management o Take medications as prescribed o Report any questions or concerns to PharmD and/or provider(s) Initial goal documentation.      Depression Screen PHQ 2/9 Scores 12/18/2020 11/14/2020 10/31/2020 07/27/2020 05/10/2020 01/19/2020 12/20/2019  PHQ - 2 Score 0 0 0 0 0 0 0  PHQ- 9 Score - 0 0 - 0 0 0  Exception Documentation - - - - - - -    Fall Risk Fall Risk  12/18/2020 11/14/2020 10/31/2020 05/10/2020 01/19/2020  Falls in the past year? 0 0 0 0 0  Number falls in past yr: 0 0 0 0 0  Injury with Fall? 0 0 0 0 0  Risk for fall due to : Impaired balance/gait No Fall Risks No Fall Risks - -  Follow up Falls prevention discussed - - Falls evaluation completed Falls evaluation completed    FALL RISK PREVENTION PERTAINING TO THE HOME:  Any stairs in or around the home? Yes  If so, are there any without handrails? No  Home free of loose throw rugs in walkways, pet beds, electrical cords, etc? Yes  Adequate lighting in your home to reduce risk of falls? Yes   ASSISTIVE DEVICES UTILIZED TO PREVENT FALLS:  Life alert? No  Use of a cane, walker or w/c? Yes  Grab bars in the bathroom? Yes  Shower chair or bench in shower? Yes  Elevated toilet seat or a handicapped toilet? No   TIMED UP AND GO:  Was the test performed? No . Phone visit   Cognitive Function:Normal cognitive status assessed by  this Nurse Health  Advisor. No abnormalities found.          Immunizations Immunization History  Administered Date(s) Administered  . Fluad Quad(high Dose 65+) 09/06/2019, 09/06/2020  . Influenza, High Dose Seasonal PF 09/16/2013, 09/13/2015, 10/09/2017, 09/18/2018  . Influenza,inj,Quad PF,6+ Mos 09/20/2014, 09/09/2016  . Moderna Sars-Covid-2  Vaccination 01/01/2020, 01/31/2020, 11/15/2020  . PPD Test 03/01/2016  . Pneumococcal Conjugate-13 01/03/2015  . Pneumococcal-Unspecified 12/10/2011    TDAP status: Due, Education has been provided regarding the importance of this vaccine. Advised may receive this vaccine at local pharmacy or Health Dept. Aware to provide a copy of the vaccination record if obtained from local pharmacy or Health Dept. Verbalized acceptance and understanding.  Flu Vaccine status: Up to date  Pneumococcal vaccine status: Up to date  Covid-19 vaccine status: Completed vaccines  Qualifies for Shingles Vaccine? Yes   Zostavax completed No   Shingrix Completed?: No.    Education has been provided regarding the importance of this vaccine. Patient has been advised to call insurance company to determine out of pocket expense if they have not yet received this vaccine. Advised may also receive vaccine at local pharmacy or Health Dept. Verbalized acceptance and understanding.  Screening Tests Health Maintenance  Topic Date Due  . TETANUS/TDAP  05/10/2021 (Originally 01/14/1955)  . INFLUENZA VACCINE  Completed  . DEXA SCAN  Completed  . COVID-19 Vaccine  Completed  . PNA vac Low Risk Adult  Completed    Health Maintenance  There are no preventive care reminders to display for this patient.  Colorectal cancer screening: No longer required.   Mammogram status: No longer required due to age & patient's choice.Donzetta Kohut Density status: Due-Patient declined  Lung Cancer Screening: (Low Dose CT Chest recommended if Age 36-80 years, 30 pack-year currently smoking OR have quit w/in 15years.) does not qualify.    Additional Screening:  Hepatitis C Screening: does not qualify  Vision Screening: Recommended annual ophthalmology exams for early detection of glaucoma and other disorders of the eye. Is the patient up to date with their annual eye exam?  Yes  Who is the provider or what is the name of the office in  which the patient attends annual eye exams? Unsure of name   Dental Screening: Recommended annual dental exams for proper oral hygiene  Community Resource Referral / Chronic Care Management: CRR required this visit?  No   CCM required this visit?  No      Plan:     I have personally reviewed and noted the following in the patient's chart:   . Medical and social history . Use of alcohol, tobacco or illicit drugs  . Current medications and supplements . Functional ability and status . Nutritional status . Physical activity . Advanced directives . List of other physicians . Hospitalizations, surgeries, and ER visits in previous 12 months . Vitals . Screenings to include cognitive, depression, and falls . Referrals and appointments  In addition, I have reviewed and discussed with patient certain preventive protocols, quality metrics, and best practice recommendations. A written personalized care plan for preventive services as well as general preventive health recommendations were provided to patient.   Due to this being a telephonic visit, the after visit summary with patients personalized plan was offered to patient via mail or my-chart.  Patient would like to access on my-chart.   Marta Antu, LPN   [id number]  Nurse Health Advisor  Nurse Notes: None

## 2020-12-18 ENCOUNTER — Ambulatory Visit (INDEPENDENT_AMBULATORY_CARE_PROVIDER_SITE_OTHER): Payer: Medicare Other

## 2020-12-18 VITALS — Ht 62.0 in | Wt 154.0 lb

## 2020-12-18 DIAGNOSIS — Z0000 Encounter for general adult medical examination without abnormal findings: Secondary | ICD-10-CM | POA: Diagnosis not present

## 2020-12-18 NOTE — Patient Instructions (Signed)
Ms. Ziesmer , Thank you for taking time to complete your Medicare Wellness Visit. I appreciate your ongoing commitment to your health goals. Please review the following plan we discussed and let me know if I can assist you in the future.   Screening recommendations/referrals: Colonoscopy: No longer required Mammogram: Declined Bone Density: Declined Recommended yearly ophthalmology/optometry visit for glaucoma screening and checkup Recommended yearly dental visit for hygiene and checkup  Vaccinations: Influenza vaccine: Up to date Pneumococcal vaccine: Completed vaccines Tdap vaccine: Discuss with pharmacy Shingles vaccine: Discuss with pharmacy Covid-19:Completed vaccines  Advanced directives: Copy in chart  Conditions/risks identified: See problem list  Next appointment: Follow up in one year for your annual wellness visit 12/24/21 @ 2:15   Preventive Care 83 Years and Older, Female Preventive care refers to lifestyle choices and visits with your health care provider that can promote health and wellness. What does preventive care include?  A yearly physical exam. This is also called an annual well check.  Dental exams once or twice a year.  Routine eye exams. Ask your health care provider how often you should have your eyes checked.  Personal lifestyle choices, including:  Daily care of your teeth and gums.  Regular physical activity.  Eating a healthy diet.  Avoiding tobacco and drug use.  Limiting alcohol use.  Practicing safe sex.  Taking low-dose aspirin every day.  Taking vitamin and mineral supplements as recommended by your health care provider. What happens during an annual well check? The services and screenings done by your health care provider during your annual well check will depend on your age, overall health, lifestyle risk factors, and family history of disease. Counseling  Your health care provider may ask you questions about your:  Alcohol  use.  Tobacco use.  Drug use.  Emotional well-being.  Home and relationship well-being.  Sexual activity.  Eating habits.  History of falls.  Memory and ability to understand (cognition).  Work and work Statistician.  Reproductive health. Screening  You may have the following tests or measurements:  Height, weight, and BMI.  Blood pressure.  Lipid and cholesterol levels. These may be checked every 5 years, or more frequently if you are over 48 years old.  Skin check.  Lung cancer screening. You may have this screening every year starting at age 23 if you have a 30-pack-year history of smoking and currently smoke or have quit within the past 15 years.  Fecal occult blood test (FOBT) of the stool. You may have this test every year starting at age 14.  Flexible sigmoidoscopy or colonoscopy. You may have a sigmoidoscopy every 5 years or a colonoscopy every 10 years starting at age 21.  Hepatitis C blood test.  Hepatitis B blood test.  Sexually transmitted disease (STD) testing.  Diabetes screening. This is done by checking your blood sugar (glucose) after you have not eaten for a while (fasting). You may have this done every 1-3 years.  Bone density scan. This is done to screen for osteoporosis. You may have this done starting at age 73.  Mammogram. This may be done every 1-2 years. Talk to your health care provider about how often you should have regular mammograms. Talk with your health care provider about your test results, treatment options, and if necessary, the need for more tests. Vaccines  Your health care provider may recommend certain vaccines, such as:  Influenza vaccine. This is recommended every year.  Tetanus, diphtheria, and acellular pertussis (Tdap, Td) vaccine. You may  need a Td booster every 10 years.  Zoster vaccine. You may need this after age 23.  Pneumococcal 13-valent conjugate (PCV13) vaccine. One dose is recommended after age  10.  Pneumococcal polysaccharide (PPSV23) vaccine. One dose is recommended after age 44. Talk to your health care provider about which screenings and vaccines you need and how often you need them. This information is not intended to replace advice given to you by your health care provider. Make sure you discuss any questions you have with your health care provider. Document Released: 12/22/2015 Document Revised: 08/14/2016 Document Reviewed: 09/26/2015 Elsevier Interactive Patient Education  2017 [employer] Prevention in the Home Falls can cause injuries. They can happen to people of all ages. There are many things you can do to make your home safe and to help prevent falls. What can I do on the outside of my home?  Regularly fix the edges of walkways and driveways and fix any cracks.  Remove anything that might make you trip as you walk through a door, such as a raised step or threshold.  Trim any bushes or trees on the path to your home.  Use bright outdoor lighting.  Clear any walking paths of anything that might make someone trip, such as rocks or tools.  Regularly check to see if handrails are loose or broken. Make sure that both sides of any steps have handrails.  Any raised decks and porches should have guardrails on the edges.  Have any leaves, snow, or ice cleared regularly.  Use sand or salt on walking paths during winter.  Clean up any spills in your garage right away. This includes oil or grease spills. What can I do in the bathroom?  Use night lights.  Install grab bars by the toilet and in the tub and shower. Do not use towel bars as grab bars.  Use non-skid mats or decals in the tub or shower.  If you need to sit down in the shower, use a plastic, non-slip stool.  Keep the floor dry. Clean up any water that spills on the floor as soon as it happens.  Remove soap buildup in the tub or shower regularly.  Attach bath mats securely with double-sided  non-slip rug tape.  Do not have throw rugs and other things on the floor that can make you trip. What can I do in the bedroom?  Use night lights.  Make sure that you have a light by your bed that is easy to reach.  Do not use any sheets or blankets that are too big for your bed. They should not hang down onto the floor.  Have a firm chair that has side arms. You can use this for support while you get dressed.  Do not have throw rugs and other things on the floor that can make you trip. What can I do in the kitchen?  Clean up any spills right away.  Avoid walking on wet floors.  Keep items that you use a lot in easy-to-reach places.  If you need to reach something above you, use a strong step stool that has a grab bar.  Keep electrical cords out of the way.  Do not use floor polish or wax that makes floors slippery. If you must use wax, use non-skid floor wax.  Do not have throw rugs and other things on the floor that can make you trip. What can I do with my stairs?  Do not leave any items on  the stairs.  Make sure that there are handrails on both sides of the stairs and use them. Fix handrails that are broken or loose. Make sure that handrails are as long as the stairways.  Check any carpeting to make sure that it is firmly attached to the stairs. Fix any carpet that is loose or worn.  Avoid having throw rugs at the top or bottom of the stairs. If you do have throw rugs, attach them to the floor with carpet tape.  Make sure that you have a light switch at the top of the stairs and the bottom of the stairs. If you do not have them, ask someone to add them for you. What else can I do to help prevent falls?  Wear shoes that:  Do not have high heels.  Have rubber bottoms.  Are comfortable and fit you well.  Are closed at the toe. Do not wear sandals.  If you use a stepladder:  Make sure that it is fully opened. Do not climb a closed stepladder.  Make sure that both  sides of the stepladder are locked into place.  Ask someone to hold it for you, if possible.  Clearly mark and make sure that you can see:  Any grab bars or handrails.  First and last steps.  Where the edge of each step is.  Use tools that help you move around (mobility aids) if they are needed. These include:  Canes.  Walkers.  Scooters.  Crutches.  Turn on the lights when you go into a dark area. Replace any light bulbs as soon as they burn out.  Set up your furniture so you have a clear path. Avoid moving your furniture around.  If any of your floors are uneven, fix them.  If there are any pets around you, be aware of where they are.  Review your medicines with your doctor. Some medicines can make you feel dizzy. This can increase your chance of falling. Ask your doctor what other things that you can do to help prevent falls. This information is not intended to replace advice given to you by your health care provider. Make sure you discuss any questions you have with your health care provider. Document Released: 09/21/2009 Document Revised: 05/02/2016 Document Reviewed: 12/30/2014 Elsevier Interactive Patient Education  2017 [employer].

## 2020-12-26 ENCOUNTER — Other Ambulatory Visit: Payer: Self-pay | Admitting: Family Medicine

## 2020-12-30 ENCOUNTER — Other Ambulatory Visit: Payer: Self-pay | Admitting: Family Medicine

## 2021-01-03 ENCOUNTER — Telehealth: Payer: Self-pay

## 2021-01-03 NOTE — Chronic Care Management (AMB) (Signed)
Chronic Care Management Pharmacy Assistant   Name: Tasha Moss  MRN: [id number] DOB: March 29, 1936  Reason for Encounter: Disease State/ General Adherence Call  PCP : [name], MD  Allergies:   Allergies  Allergen Reactions  . Gabapentin     hallucinations  . Other     Strong pain medications cause n/v  . Morphine     Nausea & vomiting    Medications: Outpatient Encounter Medications as of 01/03/2021  Medication Sig  . acetaminophen (TYLENOL) 500 MG tablet Take 500 mg by mouth every 6 (six) hours as needed.  . chlorhexidine (PERIDEX) 0.12 % solution Use as directed 15 mLs in the mouth or throat 2 (two) times daily.   . Cholecalciferol (VITAMIN D3) 1000 UNITS CAPS Take 1,000 Units by mouth daily.  . Cranberry 200 MG CAPS Take 1 capsule by mouth daily.  . fenofibrate 160 MG tablet Take 1 tablet (160 mg total) by mouth daily.  Marland Kitchen FLUoxetine (PROZAC) 20 MG capsule Take 1 capsule (20 mg total) by mouth daily.  Marland Kitchen glucosamine-chondroitin 500-400 MG tablet Take 1 tablet by mouth every other day.   . levothyroxine (EUTHYROX) 125 MCG tablet TAKE 1 TABLET BY MOUTH ONCE DAILY BEFORE BREAKFAST  . loratadine (CLARITIN) 10 MG tablet Take 10 mg by mouth daily.  . meloxicam (MOBIC) 7.5 MG tablet Take 1 tablet by mouth twice daily  . Multiple Vitamin (MULTIVITAMIN) tablet Take 1 tablet by mouth daily.  . pantoprazole (PROTONIX) 40 MG tablet Take 1 tablet (40 mg total) by mouth daily.  . pravastatin (PRAVACHOL) 40 MG tablet TAKE 1 TABLET BY MOUTH ONCE DAILY AT BEDTIME FOR CHOLESTEROL  . vitamin B-12 (CYANOCOBALAMIN) 250 MCG tablet Take 250 mcg by mouth daily.   No facility-administered encounter medications on file as of 01/03/2021.    Current Diagnosis: Patient Active Problem List   Diagnosis Date Noted  . Elevated BP without diagnosis of hypertension 10/31/2020  . Dizziness 10/31/2020  . 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 Depression 03/11/2016  .  Cerumen impaction 02/10/2015  . Routine general medical examination at a health care facility 06/29/2014  . H/O thyroid nodule 06/21/2013  . DIVERTICULOSIS, COLON 07/18/2010  . ESOPHAGEAL REFLUX 04/09/2010  . Stricture and stenosis of esophagus 02/21/2010  . DYSPHAGIA 02/09/2010  . DEGENERATIVE JOINT DISEASE 11/22/2009  . BENIGN PAROXYSMAL POSITIONAL VERTIGO 05/15/2009  . SPINAL STENOSIS, LUMBAR 09/28/2008  . UNS ADVRS EFF UNS RX MEDICINAL&BIOLOGICAL SBSTNC 05/30/2008  . PERNICIOUS ANEMIA 03/28/2008  . Hypothyroidism 09/08/2007  . HYPERLIPIDEMIA 09/08/2007  . RESTLESS LEG SYNDROME 09/08/2007  . HASHIMOTO'S THYROIDITIS 02/23/2007    Have you seen any other providers since your last visit with Madelin Rear, Pharm.D., BCGP?  10/04/2020 [facility] Podiatry, Edrick Kins, DPM, 10/23/2020 ED Bad Headache, Orpah Greek, MD, 10/31/2020 Adelina Mings, MD Elevated BP, 11/14/2020 OV [name], MD Routine General medical examination. No medication changes noted.  Have you had any problems recently with your health?  Patient states she takes a lot of extra strength tylenol to help with her headaches. Patient states she has them daily across her forehead. Patient states light bothers her and makes her headaches worse. Patient states she has had some trembling and weakness, which has improved some with increased water intake. Patient states she has been dehydrated and tries to drink as much water as possible.  Have you had any problems with your pharmacy?  Patient states she has not had  any problems with her pharmacy.  What issues or side effects are you having with your medications?  Patient states she is not currently having any side effects from any of her medications. Patient did want to report a reaction she had from a COVID booster vaccine on 11/15/2020. Patient reported chills, fever, rash and swelling at injection site that has since resolved.  What would you like me to  pass along to Madelin Rear, Vernon.D., BCGP for them to help you with?   Patient states she has been working on her independence and has been drinking more water.  What can we do to take care of you better?  Patient states she can't think of anything at this time.   April D Calhoun, [place] Pharmacist Assistant [phone]    Follow-Up:  Pharmacist Review

## 2021-01-05 ENCOUNTER — Encounter: Payer: Self-pay | Admitting: Neurology

## 2021-01-05 ENCOUNTER — Ambulatory Visit: Payer: Medicare Other | Admitting: Neurology

## 2021-01-05 ENCOUNTER — Other Ambulatory Visit: Payer: Self-pay

## 2021-01-05 VITALS — BP 172/82 | HR 81 | Ht 63.0 in | Wt 153.0 lb

## 2021-01-05 DIAGNOSIS — R519 Headache, unspecified: Secondary | ICD-10-CM | POA: Diagnosis not present

## 2021-01-05 DIAGNOSIS — R030 Elevated blood-pressure reading, without diagnosis of hypertension: Secondary | ICD-10-CM | POA: Diagnosis not present

## 2021-01-05 MED ORDER — PROPRANOLOL HCL ER 60 MG PO CP24: 60.0000 mg | ORAL_CAPSULE | Freq: Every day | ORAL | 5 refills | Status: DC

## 2021-01-05 NOTE — Patient Instructions (Signed)
1.  Start propranolol ER 60mg  daily.  If you develop lightheadedness, contact me.  If headaches not improved in 4 weeks, contact me 2.  Keep headache diary 3.  Follow up in 4 months

## 2021-01-05 NOTE — Progress Notes (Signed)
NEUROLOGY CONSULTATION NOTE  Tasha Moss MRN: [id number] DOB: [birth year]  Referring provider: Joseph Berkshire, MD (ED referral) Primary care provider: Annye Asa, MD  Reason for consult:  headache   Subjective:  Tasha Moss is an 85 year old right-handed female with HTN, HLD,  acquired hypothyroidism who presents for headache.  She is accompanied by her husband who supplements history.  History also obtained from ED note.  In October, she began experiencing headaches with face/teeth soreness and with dizziness and diffuse weakness.  She tried and failed meclizine, dramamine and lorazepam.  She went to the ED on 10/23/2020 for further evaluation.  CT and MRI of brain personally reviewed showed chronic small vessel ischemic changes but no acute intracranial abnormalities. EKG showed no acute abnormalities.  Blood pressure was elevated 053Z systolic but unsure if related to headache.  SHe was treated with headache cocktail and discharged on Zofran.  She followed up with her PCP who suspected untreated sinus inflammation secondary to allergies  She was treated with antihistamines and Flonase, which didn't improve her headaches.  She carries a persistent dull non-throbbing "numbing" headache across her forehead.  However, she has exacerbations where the headache becomes more severe and associated with dizziness and diffuse weakness.  This will last maybe 2 hours with Tylenol. It often occurs in the afternoon and may occur at night.  Initially, she had associated photophobia and phonophobia but not with recent attacks.  She denies any preceding illness or head trauma.  At home, her blood pressure is elevated, often in the 767H systolic.  She denies prior history of hypertension or migraines.  She has history of vertigo, but this dizziness is different.  She has longstanding history of motion sickness since childhood.  She does take meloxicam and Tylenol daily for her arthritis.  Current  NSAIDS/analgesics:  Tylenol, meloxicam Current triptans:  none Current ergotamine:  none Current anti-emetic:  none Current muscle relaxants:  none Current Antihypertensive medications:  none Current Antidepressant medications:  Fluoxetine 20mg  daily Current Anticonvulsant medications:  none Current anti-CGRP:  None  Past anticonvulsant medications:  Gabapentin (hallucinations)  PAST MEDICAL HISTORY: Past Medical History:  Diagnosis Date  . Allergy    seasonal & dust  . Anxiety   . Arthritis    DJD  . Family history of adverse reaction to anesthesia    n/v   . GERD (gastroesophageal reflux disease)   . History of vertigo   . Hyperlipidemia   . Hypertension   . Hypothyroidism   . PONV (postoperative nausea and vomiting)   . Thyroid disease    hypothyroidism post Hashimoto's & partial thyroidectomy    PAST SURGICAL HISTORY: Past Surgical History:  Procedure Laterality Date  . CATARACT EXTRACTION W/ INTRAOCULAR LENS  IMPLANT, BILATERAL    . COLONOSCOPY     diverticulosis  . [place]   THR bilaterally   . lumbar disc repair  1989   Dr Arrie Aran  . SPINE SURGERY     cervical ; Dr Vertell Limber  . THYROIDECTOMY, PARTIAL     benign nodule  . TONSILLECTOMY AND ADENOIDECTOMY    . TOTAL SHOULDER ARTHROPLASTY Left 02/27/2016   Procedure: LEFT REVERSE TOTAL SHOULDER ARTHROPLASTY;  Surgeon: Renette Butters, MD;  Location: [place];  Service: Orthopedics;  Laterality: Left;  . vitreous detachment     Dr Claudean Kinds    MEDICATIONS: Current Outpatient Medications on File Prior to Visit  Medication Sig Dispense Refill  .  acetaminophen (TYLENOL) 500 MG tablet Take 500 mg by mouth every 6 (six) hours as needed.    . chlorhexidine (PERIDEX) 0.12 % solution Use as directed 15 mLs in the mouth or throat 2 (two) times daily.     . Cholecalciferol (VITAMIN D3) 1000 UNITS CAPS Take 1,000 Units by mouth daily.    . Cranberry 200 MG CAPS Take 1 capsule by mouth daily.     . fenofibrate 160 MG tablet Take 1 tablet (160 mg total) by mouth daily. 90 tablet 0  . FLUoxetine (PROZAC) 20 MG capsule Take 1 capsule (20 mg total) by mouth daily. 90 capsule 0  . glucosamine-chondroitin 500-400 MG tablet Take 1 tablet by mouth every other day.     . levothyroxine (EUTHYROX) 125 MCG tablet TAKE 1 TABLET BY MOUTH ONCE DAILY BEFORE BREAKFAST 90 tablet 0  . loratadine (CLARITIN) 10 MG tablet Take 10 mg by mouth daily.    . meloxicam (MOBIC) 7.5 MG tablet Take 1 tablet by mouth twice daily 180 tablet 0  . Multiple Vitamin (MULTIVITAMIN) tablet Take 1 tablet by mouth daily.    . pantoprazole (PROTONIX) 40 MG tablet Take 1 tablet (40 mg total) by mouth daily. 90 tablet 0  . pravastatin (PRAVACHOL) 40 MG tablet TAKE 1 TABLET BY MOUTH ONCE DAILY AT BEDTIME FOR CHOLESTEROL 90 tablet 0  . vitamin B-12 (CYANOCOBALAMIN) 250 MCG tablet Take 250 mcg by mouth daily.     No current facility-administered medications on file prior to visit.    ALLERGIES: Allergies  Allergen Reactions  . Gabapentin     hallucinations  . Other     Strong pain medications cause n/v  . Morphine     Nausea & vomiting    FAMILY HISTORY: Family History  Problem Relation Age of Onset  . Diabetes Mother   . Hepatitis Mother        Hepatitis B,? etiology  . Miscarriages / [place] Mother   . Stroke Mother 14  . Heart disease Father        CHF  . Cancer Paternal Aunt        GI cancers  . Heart disease Paternal Aunt        CHF  . Cancer Paternal Uncle        lung, GI  . Heart disease Paternal Uncle        CHF   SOCIAL HISTORY: Social History   Socioeconomic History  . Marital status: Married    Spouse name: Not on file  . Number of children: 2  . Years of education: Not on file  . Highest education level: Not on file  Occupational History  . Not on file  Tobacco Use  . Smoking status: Never Smoker  . Smokeless tobacco: Never Used  Vaping Use  . Vaping Use: Never used  Substance  and Sexual Activity  . Alcohol use: No  . Drug use: No  . Sexual activity: Not on file  Other Topics Concern  . Not on file  Social History Narrative   Married for 78 years    Daughter-nurse   Daughter- Radio broadcast assistant    Social Determinants of Health   Financial Resource Strain: Low Risk   . Difficulty of Paying Living Expenses: Not hard at all  Food Insecurity: No Food Insecurity  . Worried About Charity fundraiser in the Last Year: Never true  . Ran Out of Food in the Last Year: Never true  Transportation Needs: No Transportation  Needs  . Lack of Transportation (Medical): No  . Lack of Transportation (Non-Medical): No  Physical Activity: Inactive  . Days of Exercise per Week: 0 days  . Minutes of Exercise per Session: 0 min  Stress: No Stress Concern Present  . Feeling of Stress : Not at all  Social Connections: Socially Integrated  . Frequency of Communication with Friends and Family: More than three times a week  . Frequency of Social Gatherings with Friends and Family: Once a week  . Attends Religious Services: More than 4 times per year  . Active Member of Clubs or Organizations: Yes  . Attends Archivist Meetings: 1 to 4 times per year  . Marital Status: Married  Human resources officer Violence: Not At Risk  . Fear of Current or Ex-Partner: No  . Emotionally Abused: No  . Physically Abused: No  . Sexually Abused: No    Objective:  Blood pressure (!) 172/82, pulse 81, height 5\' 3"  (1.6 m), weight 153 lb (69.4 kg), SpO2 95 %. General: No acute distress.  Patient appears well-groomed.   Head:  Normocephalic/atraumatic Eyes:  fundi examined but not visualized Neck: supple, no paraspinal tenderness, full range of motion Back: No paraspinal tenderness Heart: regular rate and rhythm Lungs: Clear to auscultation bilaterally. Vascular: No carotid bruits. Neurological Exam: Mental status: alert and oriented to person, place, and time, recent and remote memory intact,  fund of knowledge intact, attention and concentration intact, speech fluent and not dysarthric, language intact. Cranial nerves: CN I: not tested CN II: pupils equal, round and reactive to light, visual fields intact CN III, IV, VI:  full range of motion, no nystagmus, no ptosis CN V: facial sensation intact. CN VII: upper and lower face symmetric CN VIII: hearing intact CN IX, X: gag intact, uvula midline CN XI: sternocleidomastoid and trapezius muscles intact CN XII: tongue midline Bulk & Tone: normal, no fasciculations. Motor:  muscle strength 5/5 throughout Sensation:  Pinprick and vibratory sensation intact. Deep Tendon Reflexes:  1+ throughout,  toes downgoing.   Finger to nose testing:  Without dysmetria.   Gait:  Cautious and unsteady.  Romberg negative  Assessment/Plan:   1. Chronic daily headache with exacerbated episodes associated with nausea, dizziness and diffuse weakness.  Semiology of acute attacks may be migraine however she denies any prior history of migraines.  MRI of brain negative for secondary intracranial etiology.  Unclear if may be due to recent elevated blood pressure over the past several months. 2.  Elevated blood pressure  1.  At this point, I would treat for chronic migraines.  I would start propranolol ER 60mg  daily as it may also address elevated blood pressure as well.  We can increase to 80mg  daily in 4 weeks if needed.  Advised to monitor for lightheadedness and contact me if she experiences this, as we would have to discontinue.  2.  May use Tylenol as needed.  While she should not refrain treating her arthritic pain, I recommended limiting treatment of acute headaches to no more than 2 days out of week to limit risk of rebound headache. 3.  Keep headache diary. 4.  Follow up 4 months.  Thank you for allowing me to take part in the care of this patient.  Metta Clines, DO  CC:  Annye Asa, MD

## 2021-01-10 ENCOUNTER — Ambulatory Visit: Payer: Medicare Other | Admitting: Podiatry

## 2021-01-22 ENCOUNTER — Other Ambulatory Visit: Payer: Self-pay

## 2021-01-22 ENCOUNTER — Ambulatory Visit: Payer: Medicare Other | Admitting: Podiatry

## 2021-01-22 DIAGNOSIS — M79675 Pain in left toe(s): Secondary | ICD-10-CM | POA: Diagnosis not present

## 2021-01-22 DIAGNOSIS — B351 Tinea unguium: Secondary | ICD-10-CM | POA: Diagnosis not present

## 2021-01-22 DIAGNOSIS — L989 Disorder of the skin and subcutaneous tissue, unspecified: Secondary | ICD-10-CM | POA: Diagnosis not present

## 2021-01-22 DIAGNOSIS — M79674 Pain in right toe(s): Secondary | ICD-10-CM

## 2021-01-22 NOTE — Progress Notes (Signed)
    Subjective: Patient is a 85 y.o. female presenting to the office today with a chief complaint of painful callus lesion(s) noted to the right foot that appeared a few months ago. She has not had any treatment for the lesion. Walking increases the pain.  Patient also complains of elongated, thickened nails that cause pain while ambulating in shoes. She is unable to trim her own nails. Patient presents today for further treatment and evaluation.  Past Medical History:  Diagnosis Date  . Allergy    seasonal & dust  . Anxiety   . Arthritis    DJD  . Family history of adverse reaction to anesthesia    n/v   . GERD (gastroesophageal reflux disease)   . History of vertigo   . Hyperlipidemia   . Hypertension   . Hypothyroidism   . PONV (postoperative nausea and vomiting)   . Thyroid disease    hypothyroidism post Hashimoto's & partial thyroidectomy    Objective:  Physical Exam General: Alert and oriented x3 in no acute distress  Dermatology: Hyperkeratotic lesion(s) present on the right foot. Pain on palpation with a central nucleated core noted. Skin is warm, dry and supple bilateral lower extremities. Negative for open lesions or macerations. Nails are tender, long, thickened and dystrophic with subungual debris, consistent with onychomycosis, 1-5 bilateral. No signs of infection noted.  Vascular: Palpable pedal pulses bilaterally. No edema or erythema noted. Capillary refill within normal limits.  Neurological: Epicritic and protective threshold grossly intact bilaterally.   Musculoskeletal Exam: Pain on palpation at the keratotic lesion(s) noted. Range of motion within normal limits bilateral. Muscle strength 5/5 in all groups bilateral.  Assessment: 1. Onychodystrophic nails 1-5 bilateral with hyperkeratosis of nails.  2. Onychomycosis of nail due to dermatophyte bilateral 3. Pre-ulcerative callus lesion noted to the right foot    Plan of Care:  1. Patient evaluated. 2.  Excisional debridement of keratoic lesion(s) using a chisel blade was performed without incident.  3. Dressed with light dressing. 4. Mechanical debridement of nails 1-5 bilaterally performed using a nail nipper. Filed with dremel without incident.  5. Patient is to return to the clinic in 3 months.   Edrick Kins, DPM [facility]  Dr. Edrick Kins, [address]                                        [place], [place] [address]                Office [phone]  Fax [phone]

## 2021-02-01 ENCOUNTER — Ambulatory Visit (INDEPENDENT_AMBULATORY_CARE_PROVIDER_SITE_OTHER): Payer: Medicare Other

## 2021-02-01 ENCOUNTER — Other Ambulatory Visit: Payer: Self-pay

## 2021-02-01 DIAGNOSIS — K219 Gastro-esophageal reflux disease without esophagitis: Secondary | ICD-10-CM

## 2021-02-01 DIAGNOSIS — R030 Elevated blood-pressure reading, without diagnosis of hypertension: Secondary | ICD-10-CM

## 2021-02-01 DIAGNOSIS — E782 Mixed hyperlipidemia: Secondary | ICD-10-CM

## 2021-02-01 DIAGNOSIS — E039 Hypothyroidism, unspecified: Secondary | ICD-10-CM

## 2021-02-01 NOTE — Progress Notes (Signed)
Chronic Care Management Pharmacy Note  02/02/2021 Name:  Tasha Moss MRN:  [id number] DOB:  1936-03-12  Subjective: Tasha Moss is an 85 y.o. year old female who is a primary patient of Tabori, Aundra Millet, MD.  The CCM team was consulted for assistance with disease management and care coordination needs.   Engaged with patient by telephone for follow up visit in response to provider referral for pharmacy case management and/or care coordination services.   Consent to Services:  The patient was given information about Chronic Care Management services, agreed to services, and gave verbal consent prior to initiation of services.  Please see initial visit note for detailed documentation.  Patient Care Team: [name], MD as PCP - General (Family Medicine) Inda Castle, MD (Inactive) as Consulting Physician (Gastroenterology) Leta Baptist, MD as Consulting Physician (Otolaryngology) Erline Levine, MD as Consulting Physician (Neurosurgery) Renette Butters, MD as Attending Physician (Orthopedic Surgery) Harriett Sine, MD as Consulting Physician (Dermatology) Madelin Rear, [facility] as Pharmacist (Pharmacist)  Objective: Lab Results  Component Value Date   CREATININE 0.57 11/14/2020   BUN 13 11/14/2020   GFR 84.39 11/14/2020   GFRNONAA >60 10/23/2020   GFRAA >60 02/19/2016   NA 143 11/14/2020   K 3.8 11/14/2020   CALCIUM 9.0 11/14/2020   CO2 31 11/14/2020   Lab Results  Component Value Date/Time   HGBA1C 5.5 01/04/2015 02:09 PM   HGBA1C 5.4 01/11/2011 08:59 AM   GFR 84.39 11/14/2020 10:39 AM   GFR 99.44 05/23/2020 01:37 PM   MICROALBUR 0.7 06/01/2008 08:40 AM    Last diabetic Eye exam: No results found for: HMDIABEYEEXA  Last diabetic Foot exam: No results found for: HMDIABFOOTEX  Lab Results  Component Value Date   CHOL 147 11/14/2020   HDL 50.30 11/14/2020   LDLCALC 75 11/14/2020   LDLDIRECT 82.0 04/09/2017   TRIG 110.0 11/14/2020   CHOLHDL 3 11/14/2020    Hepatic Function Latest Ref Rng & Units 11/14/2020 05/23/2020 12/20/2019  Total Protein 6.0 - 8.3 g/dL 6.2 5.8(L) 6.3  Albumin 3.5 - 5.2 g/dL 3.9 3.7 4.0  AST 0 - 37 U/L '16 13 15  ' ALT 0 - 35 U/L '10 9 9  ' Alk Phosphatase 39 - 117 U/L 40 46 38(L)  Total Bilirubin 0.2 - 1.2 mg/dL 0.6 0.4 0.6  Bilirubin, Direct 0.0 - 0.3 mg/dL 0.1 0.1 0.1   Lab Results  Component Value Date/Time   TSH 0.90 11/14/2020 10:39 AM   TSH 0.51 05/23/2020 01:37 PM   FREET4 2.0 (H) 11/10/2019 09:31 AM   CBC Latest Ref Rng & Units 11/14/2020 10/23/2020 05/23/2020  WBC 4.0 - 10.5 K/uL 5.6 6.5 6.7  Hemoglobin 12.0 - 15.0 g/dL 12.2 12.5 11.8(L)  Hematocrit 36.0 - 46.0 % 35.6(L) 37.6 34.3(L)  Platelets 150.0 - 400.0 K/uL 213.0 233 226.0   No results found for: VD25OH  Clinical ASCVD: No  The ASCVD Risk score Mikey Bussing DC Jr., et al., 2013) failed to calculate for the following reasons:   The 2013 ASCVD risk score is only valid for ages 29 to 55    Depression screen PHQ 2/9 12/18/2020 11/14/2020 10/31/2020  Decreased Interest 0 0 0  Down, Depressed, Hopeless 0 0 0  PHQ - 2 Score 0 0 0  Altered sleeping - 0 0  Tired, decreased energy - 0 0  Change in appetite - 0 0  Feeling bad or failure about yourself  - 0 0  Trouble concentrating - 0 0  Moving slowly or fidgety/restless - 0 0  Suicidal thoughts - 0 0  PHQ-9 Score - 0 0  Difficult doing work/chores - - Not difficult at all  Some recent data might be hidden    Social History   Tobacco Use  Smoking Status Never Smoker  Smokeless Tobacco Never Used   BP Readings from Last 3 Encounters:  01/05/21 (!) 172/82  11/14/20 (!) 145/70  10/31/20 (!) 150/80   Pulse Readings from Last 3 Encounters:  01/05/21 81  11/14/20 70  10/31/20 70   Wt Readings from Last 3 Encounters:  01/05/21 153 lb (69.4 kg)  12/18/20 154 lb (69.9 kg)  11/14/20 154 lb 12.8 oz (70.2 kg)   Assessment/Interventions: Review of patient past medical history, allergies, medications, health  status, including review of consultants reports, laboratory and other test data, was performed as part of comprehensive evaluation and provision of chronic care management services.   SDOH:  (Social Determinants of Health) assessments and interventions performed: Yes  CCM Care Plan Allergies  Allergen Reactions  . Gabapentin     hallucinations  . Other     Strong pain medications cause n/v  . Morphine     Nausea & vomiting   Medications Reviewed Today    Reviewed by Madelin Rear, [facility] (Pharmacist) on 02/01/21 at 1446  Med List Status: <None>  Medication Order Taking? Sig Documenting Provider Last Dose Status Informant  acetaminophen (TYLENOL) 500 MG tablet [id number] No Take 500 mg by mouth every 6 (six) hours as needed. [provider] Taking Active Multiple Informants  chlorhexidine (PERIDEX) 0.12 % solution [id number] No Use as directed 15 mLs in the mouth or throat 2 (two) times daily.  [provider] Taking Active Multiple Informants  Cholecalciferol (VITAMIN D3) 1000 UNITS CAPS [id number] No Take 1,000 Units by mouth daily. [provider] Taking Active Multiple Informants           Med Note Nestor Lewandowsky   Mon Mar 04, 2016  3:05 PM)     Cranberry 200 MG CAPS [id number] No Take 1 capsule by mouth daily. [provider] Taking Active Multiple Informants  fenofibrate 160 MG tablet [id number] No Take 1 tablet (160 mg total) by mouth daily. [name], MD Taking Active   FLUoxetine (PROZAC) 20 MG capsule [id number] No Take 1 capsule (20 mg total) by mouth daily. [name], MD Taking Active   glucosamine-chondroitin 500-400 MG tablet [id number] No Take 1 tablet by mouth every other day.  [provider] Taking Active Multiple Informants           Med Note Legrand Como, Marlan Palau   Fri Jan 05, 2021  2:41 PM) On hold  levothyroxine (EUTHYROX) 125 MCG tablet [id number] No TAKE 1 TABLET BY MOUTH ONCE DAILY BEFORE BREAKFAST [name], MD Taking Active   loratadine (CLARITIN) 10 MG tablet [id number] No Take 10 mg by mouth daily. [provider] Taking Active Multiple Informants  meloxicam (MOBIC) 7.5 MG tablet [id number] No Take 1 tablet by mouth twice daily [name], MD Taking Active   Multiple Vitamin (MULTIVITAMIN) tablet [id number] No Take 1 tablet by mouth daily. [provider] Taking Active Multiple Informants           Med Note Nestor Lewandowsky   Mon Mar 04, 2016  3:05 PM)     pantoprazole (PROTONIX) 40 MG tablet [id number] No Take 1 tablet (40 mg total) by mouth daily. [name], MD  Taking Active   pravastatin (PRAVACHOL) 40 MG tablet [id number] No TAKE 1 TABLET BY MOUTH ONCE DAILY AT BEDTIME FOR CHOLESTEROL [name], MD Taking Active   propranolol ER (INDERAL LA) 60 MG 24 hr capsule [id number]  Take 1 capsule (60 mg total) by mouth daily. Pieter Partridge, DO  Active   vitamin B-12 (CYANOCOBALAMIN) 250 MCG tablet [id number] No Take 250 mcg by mouth daily. [provider] Taking Active Multiple Informants           Med Note Marlowe Sax Mar 04, 2016  3:06 PM)            Patient Active Problem List   Diagnosis Date Noted  . Elevated BP without diagnosis of hypertension 10/31/2020  . Dizziness 10/31/2020  . 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 Depression 03/11/2016  . Cerumen impaction 02/10/2015  . Routine general medical examination at a health care facility 06/29/2014  . H/O thyroid nodule 06/21/2013  . DIVERTICULOSIS, COLON 07/18/2010  . ESOPHAGEAL REFLUX 04/09/2010  . Stricture and stenosis of esophagus 02/21/2010  . DYSPHAGIA 02/09/2010  . DEGENERATIVE JOINT DISEASE 11/22/2009  . BENIGN PAROXYSMAL POSITIONAL VERTIGO 05/15/2009  . SPINAL STENOSIS, LUMBAR 09/28/2008  . UNS ADVRS EFF UNS RX MEDICINAL&BIOLOGICAL SBSTNC 05/30/2008  . PERNICIOUS ANEMIA 03/28/2008  . Hypothyroidism 09/08/2007  .  HYPERLIPIDEMIA 09/08/2007  . RESTLESS LEG SYNDROME 09/08/2007  . HASHIMOTO'S THYROIDITIS 02/23/2007   Immunization History  Administered Date(s) Administered  . Fluad Quad(high Dose 65+) 09/06/2019, 09/06/2020  . Influenza, High Dose Seasonal PF 09/16/2013, 09/13/2015, 10/09/2017, 09/18/2018  . Influenza,inj,Quad PF,6+ Mos 09/20/2014, 09/09/2016  . Moderna Sars-Covid-2 Vaccination 01/01/2020, 01/31/2020, 11/15/2020  . PPD Test 03/01/2016  . Pneumococcal Conjugate-13 01/03/2015  . Pneumococcal-Unspecified 12/10/2011    Conditions to be addressed/monitored:  Hyperlipidemia, GERD and white coat hypertension There are no care plans that you recently modified to display for this patient.   Medication Assistance: None required.  Patient affirms current coverage meets needs. Patient's preferred pharmacy is:  [facility]) [place] [address] Phone: [phone] Fax: [place] [address], [place] - [place] [address] [place] [address] [place] [address] Phone: [phone] Fax: [phone]  We discussed: Current pharmacy is preferred with insurance plan and patient is satisfied with pharmacy services. Patient decided to: Continue current medication management strategy Patient agrees to Care Plan and Follow-up.  Current Barriers:  . Suboptimal therapeutic regimen for blood pressure management  Pharmacist Clinical Goal(s):  Marland Kitchen Over the next 365 days, patient will achieve adherence to monitoring guidelines and medication adherence to achieve therapeutic efficacy . contact provider office for questions/concerns as evidenced notation of same in electronic health record through collaboration with PharmD and provider.   Interventions: . 1:1 collaboration with [name], MD regarding development and update of comprehensive plan of care as evidenced by provider  attestation and co-signature . Inter-disciplinary care team collaboration (see longitudinal plan of care) . Comprehensive medication review performed; medication list updated in electronic medical record  High Blood pressure without diagnosis of hypertension (BP goal <140/90) over next 3 months -Uncontrolled -Current treatment: . Propranolol ER 60 mg once daily (neuro- Dr Tomi Likens) -Medications previously tried: n/a  -Labile blood pressure- current home readings: 180s/70s-80s.  -Current dietary habits: reports adding salt to most meals, routine intake of processed meats such as bologna. Potato  chips are one of her favorite snacks.   -Current exercise habits: limited due to osteoarthritis pain.  -Denies hypotensive/hypertensive symptoms -Educated on BP goals and benefits of medications for prevention of heart attack, stroke and kidney damage; Daily salt intake goal < 2300 mg; Importance of home blood pressure monitoring; Proper BP monitoring technique; Discussed with PCP and agreed on recommendation to take daily dose of propanolol ER 60 mg once daily, slat restricted diet, hold meloxican for time being. Counseled on when to seek emergent care, denies any symptoms at present. Scheduled for f/u with PCP 02/02/2021. -Counseled to monitor BP at home, document, and provide log at future appointments -Counseled on diet and exercise extensively Recommended to continue current medication  -If wanting to treat blood pressure, consider long acting agent- olmesartan 20 mg monotherapy or combination amlodipine-olmesartan 5-20 mg once daily depending on office reading and concern for low BP -Consider TSH- patient recently switched to [facility] and dispensed generic preparation of levothyroxine. Up until January 2022 patient was on brand Euthyrox. TSH previously tested December 2021. May be reasonable to decrease dose regardless of BP   Hyperlipidemia: (LDL goal < 100) -Controlled -Current  treatment: . Pravastatin 40 mg once daily -Current dietary patterns: see HTN -Current exercise habits: see HTN -Educated on Cholesterol goals;  Benefits of statin for ASCVD risk reduction; Importance of limiting foods high in cholesterol; Exercise goal of 150 minutes per week; -Counseled on diet and exercise extensively  GERD (Goal ensure medication safety and accessibility) -Controlled -Current treatment: . Pantoprazole 40 mg once daily -Current dietary patterns: see HTN -Current exercise habits: see HTN -Educated on Cholesterol goals  Patient Goals/Self-Care Activities . Over the next 180 days, patient will:  - check blood pressure daily, document, and provide at future appointments  Future Appointments  Date Time Provider [place]  02/02/2021 11:30 AM [name], MD [facility]  04/23/2021  1:45 PM Edrick Kins, DPM [facility]  05/09/2021  8:30 AM Pieter Partridge, DO [facility] None  05/15/2021  1:00 PM [name], MD [facility]  12/24/2021  2:15 PM [facility]   Follow-up plan with Care Management Team: . CPA: TBD . RPH: 1-2 week f/u telephone visit  Madelin Rear, Pharm.D., BCGP Clinical Pharmacist [facility] [place] [phone]

## 2021-02-02 ENCOUNTER — Other Ambulatory Visit: Payer: Self-pay

## 2021-02-02 ENCOUNTER — Ambulatory Visit (INDEPENDENT_AMBULATORY_CARE_PROVIDER_SITE_OTHER): Payer: Medicare Other | Admitting: Family Medicine

## 2021-02-02 ENCOUNTER — Encounter: Payer: Self-pay | Admitting: Family Medicine

## 2021-02-02 VITALS — BP 140/70 | HR 61 | Temp 97.2°F | Resp 17 | Ht 63.0 in | Wt 153.2 lb

## 2021-02-02 DIAGNOSIS — I10 Essential (primary) hypertension: Secondary | ICD-10-CM

## 2021-02-02 DIAGNOSIS — E039 Hypothyroidism, unspecified: Secondary | ICD-10-CM | POA: Diagnosis not present

## 2021-02-02 DIAGNOSIS — F418 Other specified anxiety disorders: Secondary | ICD-10-CM

## 2021-02-02 LAB — BASIC METABOLIC PANEL
BUN: 17 mg/dL (ref 6–23)
CO2: 32 mEq/L (ref 19–32)
Calcium: 9.7 mg/dL (ref 8.4–10.5)
Chloride: 102 mEq/L (ref 96–112)
Creatinine, Ser: 0.71 mg/dL (ref 0.40–1.20)
GFR: 78.83 mL/min (ref >60.00)
Glucose, Bld: 119 mg/dL — ABNORMAL HIGH (ref 70–99)
Potassium: 3.7 mEq/L (ref 3.5–5.1)
Sodium: 141 mEq/L (ref 135–145)

## 2021-02-02 LAB — HEPATIC FUNCTION PANEL
ALT: 9 U/L (ref 0–35)
AST: 17 U/L (ref 0–37)
Albumin: 3.9 g/dL (ref 3.5–5.2)
Alkaline Phosphatase: 37 U/L — ABNORMAL LOW (ref 39–117)
Bilirubin, Direct: 0.1 mg/dL (ref 0.0–0.3)
Total Bilirubin: 0.6 mg/dL (ref 0.2–1.2)
Total Protein: 6.7 g/dL (ref 6.0–8.3)

## 2021-02-02 LAB — CBC WITH DIFFERENTIAL/PLATELET
Basophils Absolute: 0.1 10*3/uL (ref 0.0–0.1)
Basophils Relative: 0.9 % (ref 0.0–3.0)
Eosinophils Absolute: 0.3 10*3/uL (ref 0.0–0.7)
Eosinophils Relative: 4.7 % (ref 0.0–5.0)
HCT: 37.5 % (ref 36.0–46.0)
Hemoglobin: 12.7 g/dL (ref 12.0–15.0)
Lymphocytes Relative: 21.5 % (ref 12.0–46.0)
Lymphs Abs: 1.2 10*3/uL (ref 0.7–4.0)
MCHC: 33.8 g/dL (ref 30.0–36.0)
MCV: 90.6 fl (ref 78.0–100.0)
Monocytes Absolute: 0.5 10*3/uL (ref 0.1–1.0)
Monocytes Relative: 9.3 % (ref 3.0–12.0)
Neutro Abs: 3.6 10*3/uL (ref 1.4–7.7)
Neutrophils Relative %: 63.6 % (ref 43.0–77.0)
Platelets: 222 10*3/uL (ref 150.0–400.0)
RBC: 4.14 Mil/uL (ref 3.87–5.11)
RDW: 14.9 % (ref 11.5–15.5)
WBC: 5.6 10*3/uL (ref 4.0–10.5)

## 2021-02-02 LAB — TSH: TSH: 5.11 u[IU]/mL — ABNORMAL HIGH (ref 0.35–4.50)

## 2021-02-02 NOTE — Patient Instructions (Signed)
Follow up in 10 days to recheck BP We'll notify you of your lab results and make any changes if needed RESTART the tylenol CONTINUE the Meloxicam CONTINUE the Propranolol daily to help w/ headaches, tremor, anxiety DO NOT take your blood pressure at home- we'll keep track of it Try to eat less salt Try and work on stress management.  It's ok to say no and set boundaries! Call with any questions or concerns Hang in there!  You've got this!

## 2021-02-02 NOTE — Progress Notes (Signed)
   Subjective:    Patient ID: Tasha Moss, female    DOB: [birth year], 85 y.o.   MRN: [id number]  HPI HTN- pt reports home BPs have been as high as 200/90.  They did not bring their cuff today.  Pt is currently on Propranolol ER 60mg  daily (per Neuro).  BP at neuro 172/82.  Pt reports feeling 'weak and trembly' and she is now needing to use her walker.  Continues to have HA.  Pt was told to stop her Meloxicam and watch her salt intake yesterday by pharmacist.  This has her very stressed out as she was told not to take tylenol by neuro.  She has been under considerable stress as both daughters are having issues and they have been financially scammed on at least 2 occasions.  Hypothyroid- pt's pharmacy switched from brand name Euthryox at [facility] to the generic from [place].  Husband wants to switch back to brand name at [facility].   Review of Systems For ROS see HPI   This visit occurred during the SARS-CoV-2 public health emergency.  Safety protocols were in place, including screening questions prior to the visit, additional usage of staff PPE, and extensive cleaning of exam room while observing appropriate contact time as indicated for disinfecting solutions.       Objective:   Physical Exam Vitals reviewed.  Constitutional:      General: She is not in acute distress.    Appearance: Normal appearance. She is well-developed and well-nourished.  HENT:     Head: Normocephalic and atraumatic.  Eyes:     Extraocular Movements: EOM normal.     Conjunctiva/sclera: Conjunctivae normal.     Pupils: Pupils are equal, round, and reactive to light.  Neck:     Thyroid: No thyromegaly.  Cardiovascular:     Rate and Rhythm: Normal rate and regular rhythm.     Pulses: Normal pulses and intact distal pulses.     Heart sounds: Normal heart sounds. No murmur heard.   Pulmonary:     Effort: Pulmonary effort is normal. No respiratory distress.     Breath sounds: Normal breath sounds.  Abdominal:      General: There is no distension.     Palpations: Abdomen is soft.     Tenderness: There is no abdominal tenderness.  Musculoskeletal:        General: No edema.     Cervical back: Normal range of motion and neck supple.  Lymphadenopathy:     Cervical: No cervical adenopathy.  Skin:    General: Skin is warm and dry.  Neurological:     Mental Status: She is alert and oriented to person, place, and time.  Psychiatric:        Mood and Affect: Mood and affect normal.        Behavior: Behavior normal.           Assessment & Plan:

## 2021-02-02 NOTE — Patient Instructions (Signed)
Ms. Batte,  Thank you for taking the time to review your medications with me today.  I have included our care plan/goals in the following pages. Please review and call me at [phone] with any questions!  Thanks! Ellin Mayhew, Pharm.D., BCGP Clinical Pharmacist [facility] [place] [phone]   High Blood pressure without diagnosis of hypertension (BP goal <140/90) over next 3 months -Uncontrolled -Current treatment: . Propranolol ER 60 mg once daily (neuro- Dr Tomi Likens) -Medications previously tried: n/a  -Labile blood pressure- current home readings: 180s/70s-80s.  -Current dietary habits: reports adding salt to most meals, routine intake of processed meats such as bologna. Potato chips are one of her favorite snacks.   -Current exercise habits: limited due to osteoarthritis pain.  -Denies hypotensive/hypertensive symptoms -Educated on BP goals and benefits of medications for prevention of heart attack, stroke and kidney damage; Daily salt intake goal < 2300 mg; Importance of home blood pressure monitoring; Proper BP monitoring technique; Discussed with PCP and agreed on recommendation to take daily dose of propanolol ER 60 mg once daily, slat restricted diet, hold meloxican for time being. Counseled on when to seek emergent care, denies any symptoms at present. Scheduled for f/u with PCP 02/02/2021. -Counseled to monitor BP at home, document, and provide log at future appointments -Counseled on diet and exercise extensively Recommended to continue current medication  -If wanting to treat blood pressure, consider long acting agent- olmesartan 20 mg monotherapy or combination amlodipine-olmesartan 5-20 mg once daily depending on office reading and concern for low BP -Consider TSH- patient recently switched to [facility] and dispensed generic preparation of levothyroxine. Up until January 2022 patient was on brand Euthyrox. TSH  previously tested December 2021. May be reasonable to decrease dose regardless of BP   Hyperlipidemia: (LDL goal < 100) -Controlled -Current treatment: . Pravastatin 40 mg once daily -Current dietary patterns: see HTN -Current exercise habits: see HTN -Educated on Cholesterol goals;  Benefits of statin for ASCVD risk reduction; Importance of limiting foods high in cholesterol; Exercise goal of 150 minutes per week; -Counseled on diet and exercise extensively  GERD (Goal ensure medication safety and accessibility) -Controlled -Current treatment: . Pantoprazole 40 mg once daily -Current dietary patterns: see HTN -Current exercise habits: see HTN -Educated on Cholesterol goals  Patient Goals/Self-Care Activities . Over the next 180 days, patient will:  - check blood pressure daily, document, and provide at future appointments    The patient verbalized understanding of instructions provided today and agreed to receive a MyChart copy of patient instruction and/or educational materials. Telephone follow up appointment with pharmacy team member scheduled for: See next appointment with "Care Management Staff" under "What's Next" below.

## 2021-02-04 ENCOUNTER — Encounter: Payer: Self-pay | Admitting: Family Medicine

## 2021-02-04 NOTE — Assessment & Plan Note (Signed)
Chronic problem.  Pt's medication was switched from brand name to generic when she switched to mail order.  Husband has not been pleased w/ mail order so he wants the prescription sent back to [facility].  Check labs.  Adjust meds prn

## 2021-02-04 NOTE — Assessment & Plan Note (Signed)
Chronic problem.  Pt's BP has been adequately controlled until recently.  It was elevated at neuro but that was her first visit and admits to being nervous.  She is not sure if her home cuff is accurate or not and she did not bring it today to check it.  Neuro started her on Propranolol to help w/ headaches and hopefully this will help w/ some of her anxiety.  We discussed the need for a low salt diet and stress management.  I told her it was ok to take the Meloxicam as she has been for years and to use Tylenol as needed for breakthrough pain.  This eased her fears of not being able to manage her pain.  Given her BP today, I am hesitant to start any medication for fear of BP dropping low.  I told her not to check her BP at home and that we will follow this closely here in the office.  If BP is again elevated, we can start low dose ARB to manage BP.  We talked for

## 2021-02-04 NOTE — Assessment & Plan Note (Signed)
Deteriorated.  Pt has been under considerable stress recently.  We had a long talk- >45 minutes, >50% spent counseling- about her stressors (children, financial).  Today she was finally able to admit that she has been overwhelmed.  We discussed the importance of setting boundaries and self care.  She said she 'didn't even know how much stress I was under until today'.  I suspect this is contributing to elevated pressures.  If needed, we can adjust medication although pt doesn't want to do that at this time.

## 2021-02-05 ENCOUNTER — Other Ambulatory Visit: Payer: Self-pay

## 2021-02-05 ENCOUNTER — Telehealth: Payer: Self-pay | Admitting: Family Medicine

## 2021-02-05 DIAGNOSIS — E039 Hypothyroidism, unspecified: Secondary | ICD-10-CM

## 2021-02-05 DIAGNOSIS — R7989 Other specified abnormal findings of blood chemistry: Secondary | ICD-10-CM

## 2021-02-05 MED ORDER — LEVOTHYROXINE SODIUM 137 MCG PO TABS: 137.0000 ug | ORAL_TABLET | Freq: Every day | ORAL | 0 refills | Status: DC

## 2021-02-05 NOTE — Telephone Encounter (Signed)
Spoke with patient husband about concerns.

## 2021-02-05 NOTE — Telephone Encounter (Signed)
Husband called stating that they had gotten a message that her thyroid medication is being increased.  He would like to know if a new RX is being sent in or not.  Please advise.

## 2021-02-13 ENCOUNTER — Ambulatory Visit (INDEPENDENT_AMBULATORY_CARE_PROVIDER_SITE_OTHER): Payer: Medicare Other | Admitting: Family Medicine

## 2021-02-13 ENCOUNTER — Other Ambulatory Visit: Payer: Self-pay

## 2021-02-13 ENCOUNTER — Encounter: Payer: Self-pay | Admitting: Family Medicine

## 2021-02-13 VITALS — BP 138/82 | HR 61 | Temp 98.1°F | Resp 18 | Ht 63.0 in | Wt 153.0 lb

## 2021-02-13 DIAGNOSIS — E039 Hypothyroidism, unspecified: Secondary | ICD-10-CM | POA: Diagnosis not present

## 2021-02-13 DIAGNOSIS — R030 Elevated blood-pressure reading, without diagnosis of hypertension: Secondary | ICD-10-CM

## 2021-02-13 NOTE — Assessment & Plan Note (Signed)
Pt reports feeling better since increasing her Levothyroxine 149mcg.  She is scheduled to come back at the end of the month to ensure dose is appropriate.  Will follow.

## 2021-02-13 NOTE — Progress Notes (Signed)
   Subjective:    Patient ID: Tasha Moss, female    DOB: [birth year], 85 y.o.   MRN: [id number]  HPI Elevated BP- BP is normal today on her Propranolol ER 60mg  daily.  'overall I think i'm doing better'.  'I feel stronger'.  No CP, SOB.  HAs have improved.  No edema.  Has limited her salt intake.  Hypothyroid- pt's most recent TSH was elevated at 5.11  We increased her Levothyroxine to 167mcg daily and pt reports feeling better.  No longer as 'trembly'.  Not as weak.   Review of Systems For ROS see HPI   This visit occurred during the SARS-CoV-2 public health emergency.  Safety protocols were in place, including screening questions prior to the visit, additional usage of staff PPE, and extensive cleaning of exam room while observing appropriate contact time as indicated for disinfecting solutions.       Objective:   Physical Exam Vitals reviewed.  Constitutional:      General: She is not in acute distress.    Appearance: Normal appearance. She is not ill-appearing.  HENT:     Head: Normocephalic and atraumatic.  Eyes:     Extraocular Movements: Extraocular movements intact.     Conjunctiva/sclera: Conjunctivae normal.     Pupils: Pupils are equal, round, and reactive to light.  Cardiovascular:     Rate and Rhythm: Normal rate.  Pulmonary:     Effort: Pulmonary effort is normal.     Breath sounds: Normal breath sounds.  Musculoskeletal:     Right lower leg: No edema.     Left lower leg: No edema.  Skin:    General: Skin is warm and dry.  Neurological:     General: No focal deficit present.     Mental Status: She is alert and oriented to person, place, and time.  Psychiatric:        Mood and Affect: Mood normal.        Behavior: Behavior normal.        Thought Content: Thought content normal.          Assessment & Plan:

## 2021-02-13 NOTE — Patient Instructions (Signed)
Follow up as needed or as scheduled No changes at this time Keep up the good work!  You are looking and seeming more like yourself! Call with any questions or concerns Stay Safe!  Stay Healthy!

## 2021-02-13 NOTE — Assessment & Plan Note (Signed)
Pt's BP is at goal today.  No need to add medication.  Pt is feeling better than at last visit.  Will continue to follow.

## 2021-02-19 ENCOUNTER — Other Ambulatory Visit: Payer: Self-pay | Admitting: Family Medicine

## 2021-02-25 ENCOUNTER — Other Ambulatory Visit: Payer: Self-pay | Admitting: Family Medicine

## 2021-03-02 ENCOUNTER — Other Ambulatory Visit: Payer: Self-pay

## 2021-03-02 ENCOUNTER — Other Ambulatory Visit (INDEPENDENT_AMBULATORY_CARE_PROVIDER_SITE_OTHER): Payer: Medicare Other

## 2021-03-02 DIAGNOSIS — R7989 Other specified abnormal findings of blood chemistry: Secondary | ICD-10-CM

## 2021-03-02 DIAGNOSIS — R946 Abnormal results of thyroid function studies: Secondary | ICD-10-CM | POA: Diagnosis not present

## 2021-03-02 LAB — TSH: TSH: 3.2 u[IU]/mL (ref 0.35–4.50)

## 2021-03-05 ENCOUNTER — Telehealth: Payer: Self-pay | Admitting: Family Medicine

## 2021-03-05 ENCOUNTER — Other Ambulatory Visit: Payer: Self-pay | Admitting: Family Medicine

## 2021-03-05 NOTE — Telephone Encounter (Signed)
RX was sent to mail order - Patient wants it sent to [facility] in [place] - Please call patient.

## 2021-03-05 NOTE — Telephone Encounter (Signed)
Called and spoke with patient. I removed the mail order pharmacy from patient chart per husband request.

## 2021-03-09 ENCOUNTER — Telehealth: Payer: Self-pay

## 2021-03-09 NOTE — Chronic Care Management (AMB) (Signed)
    Chronic Care Management Pharmacy Assistant   Name: Tasha Moss  MRN: [id number] DOB: 06-16-1936  Reason for Encounter: Disease State/General Adherence Call    Recent office visits:  02/13/2021 [facility] PCP Annye Asa, MD; no medication changes indicated.  Recent consult visits:  [facility] visits:  None in previous 6 months  Medications: Outpatient Encounter Medications as of 03/09/2021  Medication Sig Note  . acetaminophen (TYLENOL) 500 MG tablet Take 500 mg by mouth every 6 (six) hours as needed.   . chlorhexidine (PERIDEX) 0.12 % solution Use as directed 15 mLs in the mouth or throat 2 (two) times daily.    . Cholecalciferol (VITAMIN D3) 1000 UNITS CAPS Take 1,000 Units by mouth daily.   . Cranberry 200 MG CAPS Take 1 capsule by mouth daily.   . fenofibrate 160 MG tablet Take 1 tablet by mouth once daily   . FLUoxetine (PROZAC) 20 MG capsule Take 1 capsule by mouth once daily   . glucosamine-chondroitin 500-400 MG tablet Take 1 tablet by mouth every other day. 01/05/2021: On hold  . levothyroxine (SYNTHROID) 137 MCG tablet Take 1 tablet (137 mcg total) by mouth daily before breakfast.   . loratadine (CLARITIN) 10 MG tablet Take 10 mg by mouth daily.   . meloxicam (MOBIC) 7.5 MG tablet Take 1 tablet by mouth twice daily   . Multiple Vitamin (MULTIVITAMIN) tablet Take 1 tablet by mouth daily.   . pantoprazole (PROTONIX) 40 MG tablet Take 1 tablet by mouth once daily   . pravastatin (PRAVACHOL) 40 MG tablet TAKE 1 TABLET BY MOUTH ONCE DAILY AT BEDTIME FOR CHOLESTEROL   . propranolol ER (INDERAL LA) 60 MG 24 hr capsule Take 1 capsule (60 mg total) by mouth daily.   . vitamin B-12 (CYANOCOBALAMIN) 250 MCG tablet Take 250 mcg by mouth daily.    No facility-administered encounter medications on file as of 03/09/2021.    Have you had any problems recently with your health? Patient states she is still working through a Thyroid problem. However, she states she is doing much  better.  Have you had any problems with your pharmacy? Patient states she has not had any problems with her pharmacy.  What issues or side effects are you having with your medications? Patient states she is not currently having any side effects or issues with any of her medications as this time.  What would you like me to pass along to Madelin Rear, CPP for him to help you with?  Patient states she does not have anything to pass along at this time.  What can we do to take care of you better? Patient states she is doing well and does not need anything at this time.  Future Appointments  Date Time Provider [place]  04/23/2021  1:45 PM Edrick Kins, DPM [facility]  05/15/2021  1:00 PM [name], MD [facility]  12/24/2021  2:15 PM [facility]    Patient scheduled her follow up telephone appointment with Madelin Rear, CPP on 06/01/2021 at 9:30 am.  Star Rating Drugs: Pravastatin 40 mg last filled 01/01/2021 90 DS  April D Calhoun, [place] Pharmacist Assistant [phone]

## 2021-03-19 ENCOUNTER — Other Ambulatory Visit: Payer: Self-pay | Admitting: Family Medicine

## 2021-04-01 ENCOUNTER — Other Ambulatory Visit: Payer: Self-pay | Admitting: Family Medicine

## 2021-04-04 ENCOUNTER — Other Ambulatory Visit: Payer: Self-pay | Admitting: Family Medicine

## 2021-04-18 DIAGNOSIS — H6123 Impacted cerumen, bilateral: Secondary | ICD-10-CM | POA: Diagnosis not present

## 2021-04-23 ENCOUNTER — Ambulatory Visit: Payer: Medicare Other | Admitting: Podiatry

## 2021-04-23 ENCOUNTER — Other Ambulatory Visit: Payer: Self-pay | Admitting: Family Medicine

## 2021-04-23 ENCOUNTER — Other Ambulatory Visit: Payer: Self-pay

## 2021-04-23 DIAGNOSIS — L989 Disorder of the skin and subcutaneous tissue, unspecified: Secondary | ICD-10-CM

## 2021-04-23 DIAGNOSIS — M79675 Pain in left toe(s): Secondary | ICD-10-CM | POA: Diagnosis not present

## 2021-04-23 DIAGNOSIS — B351 Tinea unguium: Secondary | ICD-10-CM | POA: Diagnosis not present

## 2021-04-23 DIAGNOSIS — M79674 Pain in right toe(s): Secondary | ICD-10-CM | POA: Diagnosis not present

## 2021-04-23 NOTE — Progress Notes (Signed)
    Subjective: Patient is a 85 y.o. female presenting to the office today with a chief complaint of painful callus lesion(s) noted to the right foot that appeared a few months ago. She has not had any treatment for the lesion. Walking increases the pain.  Patient also complains of elongated, thickened nails that cause pain while ambulating in shoes. She is unable to trim her own nails. Patient presents today for further treatment and evaluation.  Past Medical History:  Diagnosis Date  . Allergy    seasonal & dust  . Anxiety   . Arthritis    DJD  . Family history of adverse reaction to anesthesia    n/v   . GERD (gastroesophageal reflux disease)   . History of vertigo   . Hyperlipidemia   . Hypertension   . Hypothyroidism   . PONV (postoperative nausea and vomiting)   . Thyroid disease    hypothyroidism post Hashimoto's & partial thyroidectomy    Objective:  Physical Exam General: Alert and oriented x3 in no acute distress  Dermatology: Hyperkeratotic lesion(s) present on the right foot. Pain on palpation with a central nucleated core noted. Skin is warm, dry and supple bilateral lower extremities. Negative for open lesions or macerations. Nails are tender, long, thickened and dystrophic with subungual debris, consistent with onychomycosis, 1-5 bilateral. No signs of infection noted.  Vascular: Palpable pedal pulses bilaterally. No edema or erythema noted. Capillary refill within normal limits.  Neurological: Epicritic and protective threshold grossly intact bilaterally.   Musculoskeletal Exam: Pain on palpation at the keratotic lesion(s) noted. Range of motion within normal limits bilateral. Muscle strength 5/5 in all groups bilateral.  Assessment: 1. Onychodystrophic nails 1-5 bilateral with hyperkeratosis of nails.  2. Onychomycosis of nail due to dermatophyte bilateral 3. Pre-ulcerative callus lesion noted to the right foot    Plan of Care:  1. Patient evaluated. 2.  Excisional debridement of keratoic lesion(s) using a chisel blade was performed without incident.  3. Dressed with light dressing. 4. Mechanical debridement of nails 1-5 bilaterally performed using a nail nipper. Filed with dremel without incident.  5. Patient is to return to the clinic in 3 months.   Khaniyah Bezek M. Joao Mccurdy, DPM [facility]  Dr. Kenza Munar M. Keishon Chavarin, DPM    [address]                                        [place], [place] [address]                Office [phone]  Fax [phone]   

## 2021-04-30 ENCOUNTER — Other Ambulatory Visit: Payer: Self-pay | Admitting: Family Medicine

## 2021-04-30 DIAGNOSIS — E039 Hypothyroidism, unspecified: Secondary | ICD-10-CM

## 2021-05-09 ENCOUNTER — Ambulatory Visit: Payer: Medicare Other | Admitting: Neurology

## 2021-05-15 ENCOUNTER — Ambulatory Visit (INDEPENDENT_AMBULATORY_CARE_PROVIDER_SITE_OTHER): Payer: Medicare Other | Admitting: Family Medicine

## 2021-05-15 ENCOUNTER — Other Ambulatory Visit: Payer: Self-pay

## 2021-05-15 ENCOUNTER — Encounter: Payer: Self-pay | Admitting: Family Medicine

## 2021-05-15 VITALS — BP 130/72 | HR 64 | Temp 97.9°F | Resp 18 | Ht 63.0 in | Wt 150.2 lb

## 2021-05-15 DIAGNOSIS — E039 Hypothyroidism, unspecified: Secondary | ICD-10-CM

## 2021-05-15 DIAGNOSIS — I10 Essential (primary) hypertension: Secondary | ICD-10-CM

## 2021-05-15 DIAGNOSIS — E782 Mixed hyperlipidemia: Secondary | ICD-10-CM

## 2021-05-15 LAB — CBC WITH DIFFERENTIAL/PLATELET
Basophils Absolute: 0 10*3/uL (ref 0.0–0.1)
Basophils Relative: 0.7 % (ref 0.0–3.0)
Eosinophils Absolute: 0.3 10*3/uL (ref 0.0–0.7)
Eosinophils Relative: 4.8 % (ref 0.0–5.0)
HCT: 35.4 % — ABNORMAL LOW (ref 36.0–46.0)
Hemoglobin: 12 g/dL (ref 12.0–15.0)
Lymphocytes Relative: 23.6 % (ref 12.0–46.0)
Lymphs Abs: 1.3 10*3/uL (ref 0.7–4.0)
MCHC: 34 g/dL (ref 30.0–36.0)
MCV: 90.5 fl (ref 78.0–100.0)
Monocytes Absolute: 0.5 10*3/uL (ref 0.1–1.0)
Monocytes Relative: 9.7 % (ref 3.0–12.0)
Neutro Abs: 3.2 10*3/uL (ref 1.4–7.7)
Neutrophils Relative %: 61.2 % (ref 43.0–77.0)
Platelets: 223 10*3/uL (ref 150.0–400.0)
RBC: 3.91 Mil/uL (ref 3.87–5.11)
RDW: 14.6 % (ref 11.5–15.5)
WBC: 5.3 10*3/uL (ref 4.0–10.5)

## 2021-05-15 LAB — LIPID PANEL
Cholesterol: 144 mg/dL (ref 0–200)
HDL: 49.4 mg/dL (ref >39.00)
LDL Cholesterol: 71 mg/dL (ref 0–99)
NonHDL: 94.32
Total CHOL/HDL Ratio: 3
Triglycerides: 116 mg/dL (ref 0.0–149.0)
VLDL: 23.2 mg/dL (ref 0.0–40.0)

## 2021-05-15 LAB — HEPATIC FUNCTION PANEL
ALT: 9 U/L (ref 0–35)
AST: 14 U/L (ref 0–37)
Albumin: 3.7 g/dL (ref 3.5–5.2)
Alkaline Phosphatase: 37 U/L — ABNORMAL LOW (ref 39–117)
Bilirubin, Direct: 0.1 mg/dL (ref 0.0–0.3)
Total Bilirubin: 0.6 mg/dL (ref 0.2–1.2)
Total Protein: 5.9 g/dL — ABNORMAL LOW (ref 6.0–8.3)

## 2021-05-15 LAB — BASIC METABOLIC PANEL
BUN: 15 mg/dL (ref 6–23)
CO2: 27 mEq/L (ref 19–32)
Calcium: 9.1 mg/dL (ref 8.4–10.5)
Chloride: 104 mEq/L (ref 96–112)
Creatinine, Ser: 0.61 mg/dL (ref 0.40–1.20)
GFR: 82.73 mL/min (ref >60.00)
Glucose, Bld: 125 mg/dL — ABNORMAL HIGH (ref 70–99)
Potassium: 3.8 mEq/L (ref 3.5–5.1)
Sodium: 141 mEq/L (ref 135–145)

## 2021-05-15 LAB — TSH: TSH: 0.33 u[IU]/mL — ABNORMAL LOW (ref 0.35–4.50)

## 2021-05-15 NOTE — Progress Notes (Signed)
   Subjective:    Patient ID: Tasha Moss, female    DOB: March 09, 1936, 85 y.o.   MRN: [id number]  HPI HTN- chronic problem, on Propranolol 60mg  daily w/ good control.  'feeling pretty good'.  Denies CP, SOB, HAs, visual changes, edema.  Hyperlipidemia- chronic problem, on Fenofibrate 160mg  daily, Pravastatin 40mg  daily.  Not able to exercise (relies on cane and walker)  Denies abd pain, N/V.  Hypothyroid- chronic problem, on Levothyroxine 140mcg daily.  Denies changes to skin/hair/nails with exception of mild dryness.  Energy level is stable.   Review of Systems For ROS see HPI   This visit occurred during the SARS-CoV-2 public health emergency.  Safety protocols were in place, including screening questions prior to the visit, additional usage of staff PPE, and extensive cleaning of exam room while observing appropriate contact time as indicated for disinfecting solutions.       Objective:   Physical Exam Vitals reviewed.  Constitutional:      General: She is not in acute distress.    Appearance: Normal appearance. She is well-developed. She is not ill-appearing.  HENT:     Head: Normocephalic and atraumatic.  Eyes:     Conjunctiva/sclera: Conjunctivae normal.     Pupils: Pupils are equal, round, and reactive to light.  Neck:     Thyroid: No thyromegaly.  Cardiovascular:     Rate and Rhythm: Normal rate and regular rhythm.     Pulses: Normal pulses.     Heart sounds: Normal heart sounds. No murmur heard.   Pulmonary:     Effort: Pulmonary effort is normal. No respiratory distress.     Breath sounds: Normal breath sounds.  Abdominal:     General: There is no distension.     Palpations: Abdomen is soft.     Tenderness: There is no abdominal tenderness.  Musculoskeletal:     Cervical back: Normal range of motion and neck supple.     Right lower leg: Edema (trace swelling) present.     Left lower leg: Edema (trace swelling) present.  Lymphadenopathy:     Cervical: No  cervical adenopathy.  Skin:    General: Skin is warm and dry.  Neurological:     Mental Status: She is alert and oriented to person, place, and time. Mental status is at baseline.  Psychiatric:        Mood and Affect: Mood normal.        Behavior: Behavior normal.        Thought Content: Thought content normal.           Assessment & Plan:

## 2021-05-15 NOTE — Assessment & Plan Note (Signed)
Chronic problem.  Tolerating statin and fenofibrate w/o difficulty.  Discussed importance of healthy diet since she is unable to get regular exercise.  Check labs.  Adjust meds prn

## 2021-05-15 NOTE — Assessment & Plan Note (Signed)
Chronic problem.  Currently asymptomatic w/ exception of mildly dry skin.  Check labs.  Adjust meds prn

## 2021-05-15 NOTE — Patient Instructions (Signed)
Schedule your complete physical in 6 months We'll notify you of your lab results and make any changes if needed Continue to make healthy food choices- you can do it! Call with any questions or concerns Stay Safe!  Stay Healthy!

## 2021-05-15 NOTE — Assessment & Plan Note (Signed)
Chronic problem.  Well controlled today on Propranolol.  Currently asymptomatic.  No med changes at this time.

## 2021-05-16 ENCOUNTER — Other Ambulatory Visit: Payer: Self-pay

## 2021-05-16 DIAGNOSIS — E039 Hypothyroidism, unspecified: Secondary | ICD-10-CM

## 2021-05-16 DIAGNOSIS — R7989 Other specified abnormal findings of blood chemistry: Secondary | ICD-10-CM

## 2021-05-16 MED ORDER — LEVOTHYROXINE SODIUM 125 MCG PO TABS: 137.0000 ug | ORAL_TABLET | Freq: Every day | ORAL | 1 refills | Status: DC

## 2021-05-20 ENCOUNTER — Other Ambulatory Visit: Payer: Self-pay | Admitting: Family Medicine

## 2021-05-28 ENCOUNTER — Other Ambulatory Visit: Payer: Self-pay | Admitting: Family Medicine

## 2021-06-01 ENCOUNTER — Telehealth: Payer: Medicare Other

## 2021-06-06 ENCOUNTER — Ambulatory Visit (INDEPENDENT_AMBULATORY_CARE_PROVIDER_SITE_OTHER): Payer: Medicare Other

## 2021-06-06 ENCOUNTER — Other Ambulatory Visit: Payer: Self-pay | Admitting: Family Medicine

## 2021-06-06 ENCOUNTER — Encounter: Payer: Self-pay | Admitting: *Deleted

## 2021-06-06 DIAGNOSIS — E039 Hypothyroidism, unspecified: Secondary | ICD-10-CM

## 2021-06-06 DIAGNOSIS — I10 Essential (primary) hypertension: Secondary | ICD-10-CM

## 2021-06-06 NOTE — Patient Instructions (Signed)
Ms. Gulick,  Thank you for talking with me today. I have included our care plan/goals in the following pages.   Please review and call me at [phone] with any questions.  Thanks! Ellin Mayhew, Pharm.D., BCGP Clinical Pharmacist [facility] [phone] Patient Care Plan: [place] Plan     Problem Identified: HTN GERD RLS HLD      Long-Range Goal: Disease Management   Start Date: 06/06/2021  Expected End Date: 06/06/2022  This Visit's Progress: On track  Priority: High  Note:   Current Barriers:  Maintaining control over hypothyroidism  Pharmacist Clinical Goal(s):  Patient will contact provider office for questions/concerns as evidenced notation of same in electronic health record through collaboration with PharmD and provider.   Interventions: 1:1 collaboration with [name], MD regarding development and update of comprehensive plan of care as evidenced by provider attestation and co-signature Inter-disciplinary care team collaboration (see longitudinal plan of care) Comprehensive medication review performed; medication list updated in electronic medical record  Hypertension (BP goal <140/90) -Controlled -Previous issues with high home BP readings, is very well controlled based off of previous office readings. Has taken on dietary recommendations with reducing daily salt intakes. Continues daily meloxicam to help with pain, withholding has caused significant distress previously.  -Current treatment: Propanolol ER 60 mg once daily (anxiety, tremor) -Current home readings: advised not to test at home due to falsely high readings -Current dietary habits: loves to grill. In general trying to restrict carbohydrates.  -Current exercise habits: no formal exercise -Denies hypotensive/hypertensive symptoms -Counseled on diet and exercise extensively Recommended to continue current  medication  Hypothyroidism (Goal: prevent TSH oversuppression) -Not ideally controlled -Dose reduced levothyroxine 125 mcg from 137 mcg on 05/16/2021 due to TSH of 0.33. confirmed that patient is now using lower dose and has received from the pharmacy.  -Current treatment  Levothyroxine 125 mcg once daily -Reviewed administration - no issues with consistent use.  -Recommended to continue current medication Pt has TSH recheck already scheduled early 06/2021  Patient Goals/Self-Care Activities Patient will:  - take medications as prescribed  Follow Up Plan: [facility] f/u call 6 months     The patient verbalized understanding of instructions provided today and agreed to receive a MyChart copy of patient instruction and/or educational materials. Telephone follow up appointment with pharmacy team member scheduled for: See next appointment with "Care Management Staff" under "What's Next" below.

## 2021-06-06 NOTE — Progress Notes (Signed)
Chronic Care Management Pharmacy Note  06/06/2021 Name:  Tasha Moss MRN:  [id number] DOB:  May 31, 1936  Recommendations/Changes made from today's visit: No changes  Subjective: Tasha Moss is an 85 y.o. year old female who is a primary patient of Tabori, Aundra Millet, MD.  The CCM team was consulted for assistance with disease management and care coordination needs.    Engaged with patient by telephone for follow up visit in response to provider referral for pharmacy case management and/or care coordination services.   Consent to Services:  The patient was given information about Chronic Care Management services, agreed to services, and gave verbal consent prior to initiation of services.  Please see initial visit note for detailed documentation.   Patient Care Team: [name], MD as PCP - General (Family Medicine) Inda Castle, MD (Inactive) as Consulting Physician (Gastroenterology) Leta Baptist, MD as Consulting Physician (Otolaryngology) Erline Levine, MD as Consulting Physician (Neurosurgery) Renette Butters, MD as Attending Physician (Orthopedic Surgery) Harriett Sine, MD as Consulting Physician (Dermatology) Madelin Rear, [facility] as Pharmacist (Pharmacist)  Objective:  Lab Results  Component Value Date   CREATININE 0.61 05/15/2021   CREATININE 0.71 02/02/2021   CREATININE 0.57 11/14/2020    Lab Results  Component Value Date   HGBA1C 5.5 01/04/2015   Last diabetic Eye exam: No results found for: HMDIABEYEEXA  Last diabetic Foot exam: No results found for: HMDIABFOOTEX      Component Value Date/Time   CHOL 144 05/15/2021 1337   TRIG 116.0 05/15/2021 1337   HDL 49.40 05/15/2021 1337   CHOLHDL 3 05/15/2021 1337   VLDL 23.2 05/15/2021 1337   LDLCALC 71 05/15/2021 1337   LDLCALC 87 11/10/2019 0931   LDLDIRECT 82.0 04/09/2017 1339    Hepatic Function Latest Ref Rng & Units 05/15/2021 02/02/2021 11/14/2020  Total Protein 6.0 - 8.3 g/dL 5.9(L) 6.7  6.2  Albumin 3.5 - 5.2 g/dL 3.7 3.9 3.9  AST 0 - 37 U/L _0 ALT 0 - 35 U/L _1 Alk Phosphatase 39 - 117 U/L 37(L) 37(L) 40  Total Bilirubin 0.2 - 1.2 mg/dL 0.6 0.6 0.6  Bilirubin, Direct 0.0 - 0.3 mg/dL 0.1 0.1 0.1    Lab Results  Component Value Date/Time   TSH 0.33 (L) 05/15/2021 01:37 PM   TSH 3.20 03/02/2021 11:07 AM   FREET4 2.0 (H) 11/10/2019 09:31 AM    CBC Latest Ref Rng & Units 05/15/2021 02/02/2021 11/14/2020  WBC 4.0 - 10.5 K/uL 5.3 5.6 5.6  Hemoglobin 12.0 - 15.0 g/dL 12.0 12.7 12.2  Hematocrit 36.0 - 46.0 % 35.4(L) 37.5 35.6(L)  Platelets 150.0 - 400.0 K/uL 223.0 222.0 213.0    No results found for: VD25OH  Clinical ASCVD:  The ASCVD Risk score Mikey Bussing DC Jr., et al., 2013) failed to calculate for the following reasons:   The 2013 ASCVD risk score is only valid for ages 24 to 34     Social History   Tobacco Use  Smoking Status Never  Smokeless Tobacco Never   BP Readings from Last 3 Encounters:  05/15/21 130/72  02/13/21 138/82  02/02/21 140/70   Pulse Readings from Last 3 Encounters:  05/15/21 64  02/13/21 61  02/02/21 61   Wt Readings from Last 3 Encounters:  05/15/21 150 lb 3.2 oz (68.1 kg)  02/13/21 153 lb (69.4 kg)  02/02/21 153 lb 3.2 oz (69.5 kg)    Assessment: Review of patient past medical history, allergies,  medications, health status, including review of consultants reports, laboratory and other test data, was performed as part of comprehensive evaluation and provision of chronic care management services.   SDOH:  (Social Determinants of Health) assessments and interventions performed:    CCM Care Plan  Allergies  Allergen Reactions   Gabapentin     hallucinations   Other     Strong pain medications cause n/v   Morphine     Nausea & vomiting    Medications Reviewed Today     Reviewed by [name], MD (Physician) on 05/15/21 at 1317  Med List Status: <None>   Medication Order Taking? Sig Documenting Provider  Last Dose Status Informant  acetaminophen (TYLENOL) 500 MG tablet [id number] Yes Take 500 mg by mouth every 6 (six) hours as needed. [provider] Taking Active Multiple Informants  chlorhexidine (PERIDEX) 0.12 % solution [id number] Yes Use as directed 15 mLs in the mouth or throat 2 (two) times daily.  [provider] Taking Active Multiple Informants  Cholecalciferol (VITAMIN D3) 1000 UNITS CAPS [id number] Yes Take 1,000 Units by mouth daily. [provider] Taking Active Multiple Informants           Med Note Nestor Lewandowsky   Mon Mar 04, 2016  3:05 PM)     Cranberry 200 MG CAPS [id number] Yes Take 1 capsule by mouth daily. [provider] Taking Active Multiple Informants  fenofibrate 160 MG tablet [id number] Yes Take 1 tablet by mouth once daily [name], MD Taking Active   FLUoxetine (PROZAC) 20 MG capsule [id number] Yes Take 1 capsule by mouth once daily [name], MD Taking Active   glucosamine-chondroitin 500-400 MG tablet [id number] Yes Take 1 tablet by mouth every other day. [provider] Taking Active            Med Note Legrand Como, Marlan Palau   Fri Jan 05, 2021  2:41 PM) On hold  levothyroxine (SYNTHROID) 137 MCG tablet [id number] Yes TAKE 1 TABLET BY MOUTH ONCE DAILY BEFORE BREAKFAST [name], MD Taking Active   loratadine (CLARITIN) 10 MG tablet [id number] Yes Take 10 mg by mouth daily. [provider] Taking Active Multiple Informants  meloxicam (MOBIC) 7.5 MG tablet [id number] Yes Take 1 tablet by mouth twice daily [name], MD Taking Active   Multiple Vitamin (MULTIVITAMIN) tablet [id number] Yes Take 1 tablet by mouth daily. [provider] Taking Active Multiple Informants           Med Note Nestor Lewandowsky   Mon Mar 04, 2016  3:05 PM)     pantoprazole (PROTONIX) 40 MG tablet [id number] Yes Take 1 tablet by mouth once daily [name], MD Taking Active   pravastatin  (PRAVACHOL) 40 MG tablet [id number] Yes TAKE 1 TABLET BY MOUTH ONCE DAILY AT BEDTIME FOR CHOLESTEROL [name], MD Taking Active   propranolol ER (INDERAL LA) 60 MG 24 hr capsule [id number] Yes Take 1 capsule (60 mg total) by mouth daily. Pieter Partridge, DO Taking Active   vitamin B-12 (CYANOCOBALAMIN) 250 MCG tablet [id number] Yes Take 250 mcg by mouth daily. [provider] Taking Active Multiple Informants           Med Note Marlowe Sax Mar 04, 2016  3:06 PM)               Patient Active Problem List   Diagnosis Date Noted   Elevated BP without diagnosis  of hypertension 10/31/2020   Dizziness 10/31/2020   Status post reverse arthroplasty of left shoulder 03/11/2016   Depression with anxiety 03/11/2016   Cerumen impaction 02/10/2015   Routine general medical examination at a health care facility 06/29/2014   H/O thyroid nodule 06/21/2013   DIVERTICULOSIS, COLON 07/18/2010   ESOPHAGEAL REFLUX 04/09/2010   Stricture and stenosis of esophagus 02/21/2010   DYSPHAGIA 02/09/2010   DEGENERATIVE JOINT DISEASE 11/22/2009   BENIGN PAROXYSMAL POSITIONAL VERTIGO 05/15/2009   HTN (hypertension) 09/28/2008   SPINAL STENOSIS, LUMBAR 09/28/2008   UNS ADVRS EFF UNS RX MEDICINAL&BIOLOGICAL SBSTNC 05/30/2008   PERNICIOUS ANEMIA 03/28/2008   Hypothyroidism 09/08/2007   HYPERLIPIDEMIA 09/08/2007   RESTLESS LEG SYNDROME 09/08/2007   HASHIMOTO'S THYROIDITIS 02/23/2007    Immunization History  Administered Date(s) Administered   Fluad Quad(high Dose 65+) 09/06/2019, 09/06/2020   Influenza, High Dose Seasonal PF 09/16/2013, 09/13/2015, 10/09/2017, 09/18/2018   Influenza,inj,Quad PF,6+ Mos 09/20/2014, 09/09/2016   Moderna Sars-Covid-2 Vaccination 01/01/2020, 01/31/2020, 11/15/2020   PPD Test 03/01/2016   Pneumococcal Conjugate-13 01/03/2015   Pneumococcal-Unspecified 12/10/2011    Conditions to be addressed/monitored: HLD HTN GERD Hypothyroidism   Care Plan :  [place]  Updates made by Madelin Rear, [place] since 06/06/2021 12:00 AM     Problem: HTN GERD RLS HLD      Long-Range Goal: Disease Management   Start Date: 06/06/2021  Expected End Date: 06/06/2022  This Visit's Progress: On track  Priority: High  Note:   Current Barriers:  Maintaining control over hypothyroidism  Pharmacist Clinical Goal(s):  Patient will contact provider office for questions/concerns as evidenced notation of same in electronic health record through collaboration with PharmD and provider.   Interventions: 1:1 collaboration with [name], MD regarding development and update of comprehensive plan of care as evidenced by provider attestation and co-signature Inter-disciplinary care team collaboration (see longitudinal plan of care) Comprehensive medication review performed; medication list updated in electronic medical record  Hypertension (BP goal <140/90) -Controlled -Previous issues with high home BP readings, is very well controlled based off of previous office readings. Has taken on dietary recommendations with reducing daily salt intakes. Continues daily meloxicam to help with pain, withholding has caused significant distress previously.  -Current treatment: Propanolol ER 60 mg once daily (anxiety, tremor) -Current home readings: advised not to test at home due to falsely high readings -Current dietary habits: loves to grill. In general trying to restrict carbohydrates.  -Current exercise habits: no formal exercise -Denies hypotensive/hypertensive symptoms -Counseled on diet and exercise extensively Recommended to continue current medication  Hypothyroidism (Goal: prevent TSH oversuppression) -Not ideally controlled -Dose reduced levothyroxine 125 mcg from 137 mcg on 05/16/2021 due to TSH of 0.33. confirmed that patient is now using lower dose and has received from the pharmacy.  -Current treatment  Levothyroxine 125 mcg once  daily -Reviewed administration - no issues with consistent use.  -Recommended to continue current medication Pt has TSH recheck already scheduled early 06/2021  Patient Goals/Self-Care Activities Patient will:  - take medications as prescribed  Follow Up Plan: [facility] f/u call 6 months      Medication Assistance: None required.  Patient affirms current coverage meets needs.  Patient's preferred pharmacy is:  [facility] [address] [place] - [place] [address] [place] [address] Phone: [phone] Fax: [phone]  Follow Up:  Patient agrees to Care Plan and Follow-up.  Future Appointments  Date Time Provider [place]  06/13/2021  1:15 PM  [facility]  07/25/2021  1:45 PM Evans, Brent M, DPM [facility]  11/14/2021  1:30 PM Tabori, Katherine E, MD [facility]  12/12/2021  1:30 PM [facility] C[facility] PHARMACIST [facility]  12/24/2021  2:15 PM [facility] HEALTH COACH [facility]    , PharmD, CPP Clinical Pharmacist Practitioner  [facility]  [phone]        

## 2021-06-13 ENCOUNTER — Other Ambulatory Visit: Payer: Self-pay

## 2021-06-13 ENCOUNTER — Other Ambulatory Visit (INDEPENDENT_AMBULATORY_CARE_PROVIDER_SITE_OTHER): Payer: Medicare Other

## 2021-06-13 DIAGNOSIS — R7989 Other specified abnormal findings of blood chemistry: Secondary | ICD-10-CM

## 2021-06-13 LAB — TSH: TSH: 0.48 u[IU]/mL (ref 0.35–5.50)

## 2021-06-17 ENCOUNTER — Other Ambulatory Visit: Payer: Self-pay | Admitting: Family Medicine

## 2021-07-01 ENCOUNTER — Other Ambulatory Visit: Payer: Self-pay | Admitting: Neurology

## 2021-07-02 ENCOUNTER — Other Ambulatory Visit: Payer: Self-pay | Admitting: Family Medicine

## 2021-07-02 NOTE — Telephone Encounter (Signed)
LFD 04/02/21 #180 with no refills LOV 05/15/21 NOV 11/14/21

## 2021-07-04 ENCOUNTER — Other Ambulatory Visit: Payer: Self-pay | Admitting: Family Medicine

## 2021-07-06 DIAGNOSIS — H61021 Chronic perichondritis of right external ear: Secondary | ICD-10-CM | POA: Diagnosis not present

## 2021-07-06 DIAGNOSIS — B078 Other viral warts: Secondary | ICD-10-CM | POA: Diagnosis not present

## 2021-07-06 DIAGNOSIS — L820 Inflamed seborrheic keratosis: Secondary | ICD-10-CM | POA: Diagnosis not present

## 2021-07-06 DIAGNOSIS — D485 Neoplasm of uncertain behavior of skin: Secondary | ICD-10-CM | POA: Diagnosis not present

## 2021-07-06 DIAGNOSIS — L821 Other seborrheic keratosis: Secondary | ICD-10-CM | POA: Diagnosis not present

## 2021-07-10 ENCOUNTER — Telehealth: Payer: Self-pay

## 2021-07-10 NOTE — Progress Notes (Signed)
    Chronic Care Management Pharmacy Assistant   Name: Tasha Moss  MRN: [id number] DOB: 13-Sep-1936  Reason for Encounter: Disease State - General Adherence    Recent office visits:  None noted.   Recent consult visits:  None noted.  Hospital visits:  None in previous 6 months  Medications: Outpatient Encounter Medications as of 07/10/2021  Medication Sig Note   acetaminophen (TYLENOL) 500 MG tablet Take 500 mg by mouth every 6 (six) hours as needed.    chlorhexidine (PERIDEX) 0.12 % solution Use as directed 15 mLs in the mouth or throat 2 (two) times daily.     Cholecalciferol (VITAMIN D3) 1000 UNITS CAPS Take 1,000 Units by mouth daily.    Cranberry 200 MG CAPS Take 1 capsule by mouth daily.    fenofibrate 160 MG tablet Take 1 tablet by mouth once daily    FLUoxetine (PROZAC) 20 MG capsule Take 1 capsule by mouth once daily    glucosamine-chondroitin 500-400 MG tablet Take 1 tablet by mouth every other day. 01/05/2021: On hold   levothyroxine (SYNTHROID) 125 MCG tablet Take 1 tablet (125 mcg total) by mouth daily before breakfast.    loratadine (CLARITIN) 10 MG tablet Take 10 mg by mouth daily.    meloxicam (MOBIC) 7.5 MG tablet Take 1 tablet by mouth twice daily    Multiple Vitamin (MULTIVITAMIN) tablet Take 1 tablet by mouth daily.    pantoprazole (PROTONIX) 40 MG tablet Take 1 tablet by mouth once daily    pravastatin (PRAVACHOL) 40 MG tablet TAKE 1 TABLET BY MOUTH ONCE DAILY AT BEDTIME FOR CHOLESTEROL    propranolol ER (INDERAL LA) 60 MG 24 hr capsule Take 1 capsule by mouth once daily    vitamin B-12 (CYANOCOBALAMIN) 250 MCG tablet Take 250 mcg by mouth daily.    No facility-administered encounter medications on file as of 07/10/2021.   Have you had any problems recently with your health? Patient stated she has not had any problems with her health recently.   Have you had any problems with your pharmacy? Patient reported she does not have any current problems with her  pharmacy.   What issues or side effects are you having with your medications? Patient denied any side effects or issues with her current medications.   What would you like me to pass along to Madelin Rear, CPP for them to help you with?  Patient did not have anything she would like to discuss at this time.  What can we do to take care of you better? Patient did not have any recommendations at this time.   Star Rating Drugs: pravastatin (PRAVACHOL) 40 MG tablet - last filled 07/03/21 90 days    Jobe Gibbon, CCMA Clinical Pharmacist Assistant  [phone]  Time Spent: 20 minutes

## 2021-07-16 ENCOUNTER — Other Ambulatory Visit: Payer: Self-pay | Admitting: Family Medicine

## 2021-07-25 ENCOUNTER — Other Ambulatory Visit: Payer: Self-pay

## 2021-07-25 ENCOUNTER — Ambulatory Visit: Payer: Medicare Other | Admitting: Podiatry

## 2021-07-25 DIAGNOSIS — M79675 Pain in left toe(s): Secondary | ICD-10-CM

## 2021-07-25 DIAGNOSIS — B351 Tinea unguium: Secondary | ICD-10-CM | POA: Diagnosis not present

## 2021-07-25 DIAGNOSIS — M79674 Pain in right toe(s): Secondary | ICD-10-CM

## 2021-07-25 DIAGNOSIS — L989 Disorder of the skin and subcutaneous tissue, unspecified: Secondary | ICD-10-CM

## 2021-07-25 NOTE — Progress Notes (Signed)
    Subjective: Patient is a 85 y.o. female presenting to the office today with a chief complaint of painful callus lesion(s) noted to the right foot that appeared a few months ago. She has not had any treatment for the lesion. Walking increases the pain.  Patient also complains of elongated, thickened nails that cause pain while ambulating in shoes. She is unable to trim her own nails. Patient presents today for further treatment and evaluation.  Past Medical History:  Diagnosis Date   Allergy    seasonal & dust   Anxiety    Arthritis    DJD   Family history of adverse reaction to anesthesia    n/v    GERD (gastroesophageal reflux disease)    History of vertigo    Hyperlipidemia    Hypertension    Hypothyroidism    PONV (postoperative nausea and vomiting)    Thyroid disease    hypothyroidism post Hashimoto's & partial thyroidectomy    Objective:  Physical Exam General: Alert and oriented x3 in no acute distress  Dermatology: Hyperkeratotic lesion(s) present on the right foot. Pain on palpation with a central nucleated core noted. Skin is warm, dry and supple bilateral lower extremities. Negative for open lesions or macerations. Nails are tender, long, thickened and dystrophic with subungual debris, consistent with onychomycosis, 1-5 bilateral. No signs of infection noted.  Vascular: Palpable pedal pulses bilaterally. No edema or erythema noted. Capillary refill within normal limits.  Neurological: Epicritic and protective threshold grossly intact bilaterally.   Musculoskeletal Exam: Pain on palpation at the keratotic lesion(s) noted. Range of motion within normal limits bilateral. Muscle strength 5/5 in all groups bilateral.  Assessment: 1. Onychodystrophic nails 1-5 bilateral with hyperkeratosis of nails.  2. Onychomycosis of nail due to dermatophyte bilateral 3. Pre-ulcerative callus lesion noted to the right foot    Plan of Care:  1. Patient evaluated. 2. Excisional  debridement of keratoic lesion(s) using a chisel blade was performed without incident.  3. Dressed with light dressing. 4. Mechanical debridement of nails 1-5 bilaterally performed using a nail nipper. Filed with dremel without incident.  5. Patient is to return to the clinic in 3 months.   *Daughter is [place], RN @ [facility] surgical center. Goes by "[employer]"  Edrick Kins, DPM [facility]  Dr. Edrick Kins, DPM    [address], [place] [address]                Office [phone]  Fax [phone]

## 2021-07-30 ENCOUNTER — Other Ambulatory Visit: Payer: Self-pay | Admitting: Neurology

## 2021-08-02 ENCOUNTER — Telehealth: Payer: Self-pay | Admitting: Family Medicine

## 2021-08-02 ENCOUNTER — Other Ambulatory Visit: Payer: Self-pay | Admitting: Family

## 2021-08-02 MED ORDER — PROPRANOLOL HCL ER 60 MG PO CP24: 60.0000 mg | ORAL_CAPSULE | Freq: Every day | ORAL | 1 refills | Status: DC

## 2021-08-02 NOTE — Telephone Encounter (Signed)
Patient needs propanolol ER  60 mg - please send to [facility] in [place], [place] - Patient has only 1 pill left

## 2021-08-12 ENCOUNTER — Other Ambulatory Visit: Payer: Self-pay | Admitting: Family Medicine

## 2021-08-17 ENCOUNTER — Ambulatory Visit: Payer: Medicare Other

## 2021-08-24 ENCOUNTER — Ambulatory Visit (INDEPENDENT_AMBULATORY_CARE_PROVIDER_SITE_OTHER): Payer: Medicare Other | Admitting: Family Medicine

## 2021-08-24 ENCOUNTER — Other Ambulatory Visit: Payer: Self-pay

## 2021-08-24 ENCOUNTER — Encounter: Payer: Self-pay | Admitting: Family Medicine

## 2021-08-24 DIAGNOSIS — Z23 Encounter for immunization: Secondary | ICD-10-CM | POA: Diagnosis not present

## 2021-08-24 NOTE — Progress Notes (Signed)
Flu shot given.  Pt tolerated w/o difficulty

## 2021-08-26 ENCOUNTER — Other Ambulatory Visit: Payer: Self-pay | Admitting: Family Medicine

## 2021-09-04 ENCOUNTER — Other Ambulatory Visit: Payer: Self-pay | Admitting: Family Medicine

## 2021-09-10 ENCOUNTER — Telehealth: Payer: Self-pay

## 2021-09-10 NOTE — Progress Notes (Signed)
Chronic Care Management Pharmacy Assistant   Name: Tasha Moss  MRN: [id number] DOB: [birth year]   Reason for Encounter: Disease State - General Adherence Call     Recent office visits:  08/24/21 Annye Asa (PCP) - Family Medicine - Flu vaccine needed - Flu vaccine administered. No medication changes. Follow up as scheduled.   Recent consult visits:  07/25/21 Daylene Katayama, MD - Podiatry - Pain due to Onychomycosis of toenails in both feet - Excisional debridement of keratoic lesion(s) using a chisel blade was performed without incident. Mechanical debridement of nails 1-5 bilaterally performed using a nail nipper. Filed with dremel without incident. Follow up in 3 months.   Hospital visits:  None in previous 6 months  Medications: Outpatient Encounter Medications as of 09/10/2021  Medication Sig Note   acetaminophen (TYLENOL) 500 MG tablet Take 500 mg by mouth every 6 (six) hours as needed.    chlorhexidine (PERIDEX) 0.12 % solution Use as directed 15 mLs in the mouth or throat 2 (two) times daily.     Cholecalciferol (VITAMIN D3) 1000 UNITS CAPS Take 1,000 Units by mouth daily.    Cranberry 200 MG CAPS Take 1 capsule by mouth daily.    fenofibrate 160 MG tablet Take 1 tablet by mouth once daily    FLUoxetine (PROZAC) 20 MG capsule Take 1 capsule by mouth once daily    glucosamine-chondroitin 500-400 MG tablet Take 1 tablet by mouth every other day. 01/05/2021: On hold   levothyroxine (SYNTHROID) 125 MCG tablet Take 1 tablet (125 mcg total) by mouth daily before breakfast.    loratadine (CLARITIN) 10 MG tablet Take 10 mg by mouth daily.    meloxicam (MOBIC) 7.5 MG tablet Take 1 tablet by mouth twice daily    Multiple Vitamin (MULTIVITAMIN) tablet Take 1 tablet by mouth daily.    pantoprazole (PROTONIX) 40 MG tablet Take 1 tablet by mouth once daily    pravastatin (PRAVACHOL) 40 MG tablet TAKE 1 TABLET BY MOUTH ONCE DAILY AT BEDTIME FOR CHOLESTEROL    propranolol ER (INDERAL  LA) 60 MG 24 hr capsule Take 1 capsule (60 mg total) by mouth daily.    vitamin B-12 (CYANOCOBALAMIN) 250 MCG tablet Take 250 mcg by mouth daily.    No facility-administered encounter medications on file as of 09/10/2021.    Have you had any problems recently with your health? Patient denied having any recent issues with her health. She reported "Everything is fine".  Have you had any problems with your pharmacy? Patient denied any problems with her current pharmacy.   What issues or side effects are you having with your medications? Patient denied any issues or side effects of her current medications.   What would you like me to pass along to Madelin Rear, CPP for them to help you with?  Patient did not have anything to pass along to CPP at this time.   What can we do to take care of you better? Patient did not have any recommendations and reported she is satisfied with her current level of care.   Care Gaps  AWV: done 12/18/20 Colonoscopy: done 04/17/20 DM Eye Exam: N/A DM Foot Exam: done 07/25/21 Microalbumin: N/A HbgAIC: N/A DEXA: done 07/11/14 Mammogram: done 07/10/15  Star Rating Drugs: Pravastatin (PRAVACHOL) 40 MG tablet - last filled 07/03/21 90 days    Future Appointments  Date Time Provider [place]  10/31/2021  1:45 PM Edrick Kins, DPM [facility]  11/14/2021  1:30 PM Birdie Riddle,  Aundra Millet, MD [facility] [place]  12/12/2021  1:30 PM [facility] CCM PHARMACIST [facility]  12/24/2021  2:15 PM [facility] HEALTH COACH [facility] [place], [place] Pharmacist Assistant  [phone]  Time Spent: 30 minutes

## 2021-09-16 ENCOUNTER — Other Ambulatory Visit: Payer: Self-pay | Admitting: Family Medicine

## 2021-09-18 DIAGNOSIS — H353132 Nonexudative age-related macular degeneration, bilateral, intermediate dry stage: Secondary | ICD-10-CM | POA: Diagnosis not present

## 2021-09-18 DIAGNOSIS — H18423 Band keratopathy, bilateral: Secondary | ICD-10-CM | POA: Diagnosis not present

## 2021-09-18 DIAGNOSIS — H524 Presbyopia: Secondary | ICD-10-CM | POA: Diagnosis not present

## 2021-09-18 DIAGNOSIS — H04123 Dry eye syndrome of bilateral lacrimal glands: Secondary | ICD-10-CM | POA: Diagnosis not present

## 2021-09-21 ENCOUNTER — Other Ambulatory Visit: Payer: Self-pay | Admitting: Family

## 2021-09-21 NOTE — Telephone Encounter (Signed)
Patient is requesting a refill of the following medications: Requested Prescriptions   Pending Prescriptions Disp Refills   meloxicam (MOBIC) 7.5 MG tablet [Pharmacy Med Name: Meloxicam 7.5 MG Oral Tablet] 180 tablet 0    Sig: Take 1 tablet by mouth twice daily    Date of patient request: 09/21/21 Last office visit: 05/15/21 Date of last refill: 07/02/21 Last refill amount: 180 Follow up time period per chart: 6 months

## 2021-09-30 ENCOUNTER — Other Ambulatory Visit: Payer: Self-pay | Admitting: Family Medicine

## 2021-10-08 ENCOUNTER — Other Ambulatory Visit: Payer: Self-pay

## 2021-10-08 ENCOUNTER — Encounter: Payer: Self-pay | Admitting: Family Medicine

## 2021-10-08 ENCOUNTER — Ambulatory Visit (INDEPENDENT_AMBULATORY_CARE_PROVIDER_SITE_OTHER): Payer: Medicare Other | Admitting: Family Medicine

## 2021-10-08 VITALS — BP 128/70 | HR 69 | Temp 97.4°F | Resp 16 | Wt 143.6 lb

## 2021-10-08 DIAGNOSIS — E039 Hypothyroidism, unspecified: Secondary | ICD-10-CM | POA: Diagnosis not present

## 2021-10-08 DIAGNOSIS — R531 Weakness: Secondary | ICD-10-CM | POA: Diagnosis not present

## 2021-10-08 DIAGNOSIS — F418 Other specified anxiety disorders: Secondary | ICD-10-CM | POA: Diagnosis not present

## 2021-10-08 DIAGNOSIS — R519 Headache, unspecified: Secondary | ICD-10-CM

## 2021-10-08 NOTE — Patient Instructions (Signed)
Follow up as scheduled or as needed Schedule a lab appt at your convenience Continue your daily allergy medicine to help w/ the headaches Change positions slowly to allow yourself time to adjust Make sure you are using your walker for support and balance Call with any questions or concerns Hang in there!!!

## 2021-10-08 NOTE — Progress Notes (Signed)
Subjective:    Patient ID: Tasha Moss, female    DOB: [birth year], 85 y.o.   MRN: [id number]  HPI Headache/weakness- pt reports sxs x2 weeks.  Feels that she needs to pause upon standing and doesn't feel certain that her 'legs will cooperate'.  Finds herself shuffling.  HAs are similar to previous but seem to be lasting longer.  HAs are frontal- 'i want to rub it'.  Some improvement w/ Tylenol.  Taking Allergy medication daily and using nasal spray.  She states these are all 'symptoms that i've had before'.  Pt feels she had similar sxs when thyroid medication was too low.     Depression- currently on Prozac 20mg  daily.  They have had a lot of death recently.  Daughter continues to deal w/ issues resulting from work accident.  'i guess I want to feel young again'.  Pt aware she's 'at a point in my life where I can't do what I want to'.  Pt reports when she lies down at night she feels 'all wound up on the inside'.   Review of Systems For ROS see HPI   This visit occurred during the SARS-CoV-2 public health emergency.  Safety protocols were in place, including screening questions prior to the visit, additional usage of staff PPE, and extensive cleaning of exam room while observing appropriate contact time as indicated for disinfecting solutions.      Objective:   Physical Exam Vitals reviewed.  Constitutional:      General: She is not in acute distress.    Appearance: She is not ill-appearing.     Comments: Frail appearing  HENT:     Head: Normocephalic and atraumatic.  Eyes:     General: No scleral icterus.    Extraocular Movements: Extraocular movements intact.     Pupils: Pupils are equal, round, and reactive to light.  Cardiovascular:     Rate and Rhythm: Normal rate and regular rhythm.     Pulses: Normal pulses.     Heart sounds: Normal heart sounds.  Pulmonary:     Effort: Pulmonary effort is normal. No respiratory distress.     Breath sounds: Normal breath sounds. No  wheezing or rales.  Abdominal:     General: There is no distension.     Palpations: Abdomen is soft.     Tenderness: There is no abdominal tenderness. There is no guarding.  Musculoskeletal:     Cervical back: Neck supple.     Right lower leg: No edema.     Left lower leg: No edema.  Lymphadenopathy:     Cervical: No cervical adenopathy.  Skin:    General: Skin is warm and dry.     Findings: No rash.  Neurological:     Mental Status: She is alert and oriented to person, place, and time. Mental status is at baseline.     Gait: Gait abnormal (stooped and shuffling).  Psychiatric:     Comments: Anxious and depressed          Assessment & Plan:   Generalized weakness- ongoing issue for pt but she feels this has worsened recently.  Feels like her legs will 'give out' and has to use her walker at all times.  I suspect this is due to aging and her poor sleep, and anxiety/depression.  Check labs to determine if there is anything that needs to be corrected.  Pt expressed understanding and is in agreement w/ plan.   Frontal HA- ongoing issue for  pt but worse recently.  Feels it is allergy related.  Sxs improve w/ tylenol and allergy medication.  No evidence of bacterial sinusitis.  Reviewed supportive care.

## 2021-10-08 NOTE — Assessment & Plan Note (Signed)
Ongoing issue.  Pt is having a very hard time accepting that she is no longer able to do what she once could.  She has had a lot of loss recently and a lot of personal stress.  She reports feeling exhausted during the day but wound up at night.  If labs are unremarkable, I suspect a lot of her physical sxs are due to her depression and anxiety.  We discussed increasing the dose but pt wants to wait on labs before doing anything.

## 2021-10-08 NOTE — Assessment & Plan Note (Signed)
Chronic problem.  Pt reports feeling similar to how she did when her medications needed to be adjusted.  Check labs and make any changes if needed.

## 2021-10-10 ENCOUNTER — Other Ambulatory Visit: Payer: Self-pay

## 2021-10-10 ENCOUNTER — Other Ambulatory Visit (INDEPENDENT_AMBULATORY_CARE_PROVIDER_SITE_OTHER): Payer: Medicare Other

## 2021-10-10 DIAGNOSIS — E039 Hypothyroidism, unspecified: Secondary | ICD-10-CM | POA: Diagnosis not present

## 2021-10-10 DIAGNOSIS — R531 Weakness: Secondary | ICD-10-CM

## 2021-10-10 LAB — BASIC METABOLIC PANEL
BUN: 14 mg/dL (ref 6–23)
CO2: 32 mEq/L (ref 19–32)
Calcium: 8.9 mg/dL (ref 8.4–10.5)
Chloride: 103 mEq/L (ref 96–112)
Creatinine, Ser: 0.59 mg/dL (ref 0.40–1.20)
GFR: 83.16 mL/min (ref >60.00)
Glucose, Bld: 121 mg/dL — ABNORMAL HIGH (ref 70–99)
Potassium: 3.9 mEq/L (ref 3.5–5.1)
Sodium: 141 mEq/L (ref 135–145)

## 2021-10-10 LAB — HEPATIC FUNCTION PANEL
ALT: 10 U/L (ref 0–35)
AST: 16 U/L (ref 0–37)
Albumin: 3.6 g/dL (ref 3.5–5.2)
Alkaline Phosphatase: 38 U/L — ABNORMAL LOW (ref 39–117)
Bilirubin, Direct: 0.3 mg/dL (ref 0.0–0.3)
Total Bilirubin: 0.8 mg/dL (ref 0.2–1.2)
Total Protein: 5.8 g/dL — ABNORMAL LOW (ref 6.0–8.3)

## 2021-10-10 LAB — CBC WITH DIFFERENTIAL/PLATELET
Basophils Absolute: 0 10*3/uL (ref 0.0–0.1)
Basophils Relative: 0.4 % (ref 0.0–3.0)
Eosinophils Absolute: 0.3 10*3/uL (ref 0.0–0.7)
Eosinophils Relative: 4.9 % (ref 0.0–5.0)
HCT: 34.9 % — ABNORMAL LOW (ref 36.0–46.0)
Hemoglobin: 11.8 g/dL — ABNORMAL LOW (ref 12.0–15.0)
Lymphocytes Relative: 22.8 % (ref 12.0–46.0)
Lymphs Abs: 1.5 10*3/uL (ref 0.7–4.0)
MCHC: 33.9 g/dL (ref 30.0–36.0)
MCV: 91.1 fl (ref 78.0–100.0)
Monocytes Absolute: 0.5 10*3/uL (ref 0.1–1.0)
Monocytes Relative: 8 % (ref 3.0–12.0)
Neutro Abs: 4.3 10*3/uL (ref 1.4–7.7)
Neutrophils Relative %: 63.9 % (ref 43.0–77.0)
Platelets: 221 10*3/uL (ref 150.0–400.0)
RBC: 3.83 Mil/uL — ABNORMAL LOW (ref 3.87–5.11)
RDW: 15.1 % (ref 11.5–15.5)
WBC: 6.7 10*3/uL (ref 4.0–10.5)

## 2021-10-10 LAB — B12 AND FOLATE PANEL
Folate: >23.4 ng/mL (ref >5.9)
Vitamin B-12: 719 pg/mL (ref 211–911)

## 2021-10-10 LAB — TSH: TSH: 0.36 u[IU]/mL (ref 0.35–5.50)

## 2021-10-10 LAB — VITAMIN D 25 HYDROXY (VIT D DEFICIENCY, FRACTURES): VITD: 33.35 ng/mL (ref 30.00–100.00)

## 2021-10-11 ENCOUNTER — Other Ambulatory Visit: Payer: Self-pay | Admitting: Family Medicine

## 2021-10-11 DIAGNOSIS — E039 Hypothyroidism, unspecified: Secondary | ICD-10-CM

## 2021-10-15 ENCOUNTER — Other Ambulatory Visit: Payer: Self-pay | Admitting: Family Medicine

## 2021-10-17 DIAGNOSIS — H6123 Impacted cerumen, bilateral: Secondary | ICD-10-CM | POA: Diagnosis not present

## 2021-10-29 ENCOUNTER — Ambulatory Visit: Payer: Medicare Other | Admitting: Podiatry

## 2021-10-31 ENCOUNTER — Other Ambulatory Visit: Payer: Self-pay

## 2021-10-31 ENCOUNTER — Ambulatory Visit: Payer: Medicare Other | Admitting: Podiatry

## 2021-10-31 DIAGNOSIS — B351 Tinea unguium: Secondary | ICD-10-CM | POA: Diagnosis not present

## 2021-10-31 DIAGNOSIS — M79674 Pain in right toe(s): Secondary | ICD-10-CM

## 2021-10-31 DIAGNOSIS — L989 Disorder of the skin and subcutaneous tissue, unspecified: Secondary | ICD-10-CM | POA: Diagnosis not present

## 2021-10-31 DIAGNOSIS — M79675 Pain in left toe(s): Secondary | ICD-10-CM

## 2021-10-31 NOTE — Progress Notes (Signed)
    Subjective: Patient is a 85 y.o. female presenting to the office today with a chief complaint of painful callus lesion(s) noted to the right foot that appeared a few months ago.  Patient comes in every 3 months to have her nails trimmed.  Walking increases the pain.  Patient also complains of elongated, thickened nails that cause pain while ambulating in shoes. She is unable to trim her own nails. Patient presents today for further treatment and evaluation.  Past Medical History:  Diagnosis Date   Allergy    seasonal & dust   Anxiety    Arthritis    DJD   Family history of adverse reaction to anesthesia    n/v    GERD (gastroesophageal reflux disease)    History of vertigo    Hyperlipidemia    Hypertension    Hypothyroidism    PONV (postoperative nausea and vomiting)    Thyroid disease    hypothyroidism post Hashimoto's & partial thyroidectomy    Objective:  Physical Exam General: Alert and oriented x3 in no acute distress  Dermatology: Hyperkeratotic lesion(s) present on the right foot. Pain on palpation with a central nucleated core noted. Skin is warm, dry and supple bilateral lower extremities. Negative for open lesions or macerations. Nails are tender, long, thickened and dystrophic with subungual debris, consistent with onychomycosis, 1-5 bilateral. No signs of infection noted.  Vascular: Palpable pedal pulses bilaterally. No edema or erythema noted. Capillary refill within normal limits.  Neurological: Epicritic and protective threshold grossly intact bilaterally.   Musculoskeletal Exam: Pain on palpation at the keratotic lesion(s) noted. Range of motion within normal limits bilateral. Muscle strength 5/5 in all groups bilateral.  Assessment: 1. Onychodystrophic nails 1-5 bilateral with hyperkeratosis of nails.  2. Onychomycosis of nail due to dermatophyte bilateral 3. Pre-ulcerative callus lesion noted to the right foot    Plan of Care:  1. Patient  evaluated. 2. Excisional debridement of keratoic lesion(s) using a chisel blade was performed without incident.  3. Dressed with light dressing. 4. Mechanical debridement of nails 1-5 bilaterally performed using a nail nipper. Filed with dremel without incident.  5. Patient is to return to the clinic in 3 months.   *Daughter is Excel, RN @ [facility] surgical center. Goes by "[employer]"  Edrick Kins, DPM [facility]  Dr. Edrick Kins, DPM    [address], [place] [address]                Office [phone]  Fax [phone]

## 2021-11-05 ENCOUNTER — Emergency Department (HOSPITAL_COMMUNITY): Payer: Medicare Other

## 2021-11-05 ENCOUNTER — Other Ambulatory Visit: Payer: Self-pay

## 2021-11-05 ENCOUNTER — Emergency Department (HOSPITAL_COMMUNITY)
Admission: EM | Admit: 2021-11-05 | Discharge: 2021-11-05 | Disposition: A | Discharge status: Home / Self Care | Payer: Medicare Other | Source: Home / Self Care | Attending: Emergency Medicine | Admitting: Emergency Medicine

## 2021-11-05 ENCOUNTER — Encounter (HOSPITAL_COMMUNITY): Payer: Self-pay | Admitting: *Deleted

## 2021-11-05 DIAGNOSIS — Z471 Aftercare following joint replacement surgery: Secondary | ICD-10-CM | POA: Diagnosis not present

## 2021-11-05 DIAGNOSIS — S73005A Unspecified dislocation of left hip, initial encounter: Secondary | ICD-10-CM

## 2021-11-05 DIAGNOSIS — L89301 Pressure ulcer of unspecified buttock, stage 1: Secondary | ICD-10-CM | POA: Diagnosis not present

## 2021-11-05 DIAGNOSIS — S73012D Posterior subluxation of left hip, subsequent encounter: Secondary | ICD-10-CM | POA: Diagnosis not present

## 2021-11-05 DIAGNOSIS — Z743 Need for continuous supervision: Secondary | ICD-10-CM | POA: Diagnosis not present

## 2021-11-05 DIAGNOSIS — X509XXA Other and unspecified overexertion or strenuous movements or postures, initial encounter: Secondary | ICD-10-CM | POA: Insufficient documentation

## 2021-11-05 DIAGNOSIS — G9341 Metabolic encephalopathy: Secondary | ICD-10-CM | POA: Diagnosis not present

## 2021-11-05 DIAGNOSIS — R531 Weakness: Secondary | ICD-10-CM | POA: Diagnosis not present

## 2021-11-05 DIAGNOSIS — M159 Polyosteoarthritis, unspecified: Secondary | ICD-10-CM | POA: Diagnosis not present

## 2021-11-05 DIAGNOSIS — T84021A Dislocation of internal left hip prosthesis, initial encounter: Secondary | ICD-10-CM | POA: Insufficient documentation

## 2021-11-05 DIAGNOSIS — R42 Dizziness and giddiness: Secondary | ICD-10-CM | POA: Diagnosis not present

## 2021-11-05 DIAGNOSIS — Z8616 Personal history of COVID-19: Secondary | ICD-10-CM | POA: Diagnosis not present

## 2021-11-05 DIAGNOSIS — Z8249 Family history of ischemic heart disease and other diseases of the circulatory system: Secondary | ICD-10-CM | POA: Diagnosis not present

## 2021-11-05 DIAGNOSIS — I10 Essential (primary) hypertension: Secondary | ICD-10-CM | POA: Insufficient documentation

## 2021-11-05 DIAGNOSIS — K219 Gastro-esophageal reflux disease without esophagitis: Secondary | ICD-10-CM | POA: Diagnosis not present

## 2021-11-05 DIAGNOSIS — R4182 Altered mental status, unspecified: Secondary | ICD-10-CM | POA: Diagnosis not present

## 2021-11-05 DIAGNOSIS — Z9842 Cataract extraction status, left eye: Secondary | ICD-10-CM | POA: Diagnosis not present

## 2021-11-05 DIAGNOSIS — Z888 Allergy status to other drugs, medicaments and biological substances status: Secondary | ICD-10-CM | POA: Diagnosis not present

## 2021-11-05 DIAGNOSIS — R262 Difficulty in walking, not elsewhere classified: Secondary | ICD-10-CM | POA: Diagnosis not present

## 2021-11-05 DIAGNOSIS — E876 Hypokalemia: Secondary | ICD-10-CM | POA: Diagnosis not present

## 2021-11-05 DIAGNOSIS — R918 Other nonspecific abnormal finding of lung field: Secondary | ICD-10-CM | POA: Diagnosis not present

## 2021-11-05 DIAGNOSIS — Z7401 Bed confinement status: Secondary | ICD-10-CM | POA: Diagnosis not present

## 2021-11-05 DIAGNOSIS — F05 Delirium due to known physiological condition: Secondary | ICD-10-CM | POA: Diagnosis not present

## 2021-11-05 DIAGNOSIS — E785 Hyperlipidemia, unspecified: Secondary | ICD-10-CM | POA: Diagnosis not present

## 2021-11-05 DIAGNOSIS — R1312 Dysphagia, oropharyngeal phase: Secondary | ICD-10-CM | POA: Diagnosis not present

## 2021-11-05 DIAGNOSIS — Z79899 Other long term (current) drug therapy: Secondary | ICD-10-CM | POA: Insufficient documentation

## 2021-11-05 DIAGNOSIS — R1310 Dysphagia, unspecified: Secondary | ICD-10-CM | POA: Diagnosis not present

## 2021-11-05 DIAGNOSIS — Z96643 Presence of artificial hip joint, bilateral: Secondary | ICD-10-CM | POA: Insufficient documentation

## 2021-11-05 DIAGNOSIS — E890 Postprocedural hypothyroidism: Secondary | ICD-10-CM | POA: Diagnosis not present

## 2021-11-05 DIAGNOSIS — Z833 Family history of diabetes mellitus: Secondary | ICD-10-CM | POA: Diagnosis not present

## 2021-11-05 DIAGNOSIS — Z823 Family history of stroke: Secondary | ICD-10-CM | POA: Diagnosis not present

## 2021-11-05 DIAGNOSIS — M6281 Muscle weakness (generalized): Secondary | ICD-10-CM | POA: Diagnosis not present

## 2021-11-05 DIAGNOSIS — Z885 Allergy status to narcotic agent status: Secondary | ICD-10-CM | POA: Diagnosis not present

## 2021-11-05 DIAGNOSIS — J309 Allergic rhinitis, unspecified: Secondary | ICD-10-CM | POA: Diagnosis not present

## 2021-11-05 DIAGNOSIS — Y792 Prosthetic and other implants, materials and accessory orthopedic devices associated with adverse incidents: Secondary | ICD-10-CM | POA: Diagnosis present

## 2021-11-05 DIAGNOSIS — Z741 Need for assistance with personal care: Secondary | ICD-10-CM | POA: Diagnosis not present

## 2021-11-05 DIAGNOSIS — R6889 Other general symptoms and signs: Secondary | ICD-10-CM | POA: Diagnosis not present

## 2021-11-05 DIAGNOSIS — R2689 Other abnormalities of gait and mobility: Secondary | ICD-10-CM | POA: Diagnosis not present

## 2021-11-05 DIAGNOSIS — Z9841 Cataract extraction status, right eye: Secondary | ICD-10-CM | POA: Diagnosis not present

## 2021-11-05 DIAGNOSIS — Z961 Presence of intraocular lens: Secondary | ICD-10-CM | POA: Diagnosis not present

## 2021-11-05 DIAGNOSIS — L89151 Pressure ulcer of sacral region, stage 1: Secondary | ICD-10-CM | POA: Diagnosis not present

## 2021-11-05 DIAGNOSIS — M2550 Pain in unspecified joint: Secondary | ICD-10-CM | POA: Diagnosis not present

## 2021-11-05 DIAGNOSIS — R2681 Unsteadiness on feet: Secondary | ICD-10-CM | POA: Diagnosis not present

## 2021-11-05 DIAGNOSIS — Z96612 Presence of left artificial shoulder joint: Secondary | ICD-10-CM | POA: Insufficient documentation

## 2021-11-05 DIAGNOSIS — M533 Sacrococcygeal disorders, not elsewhere classified: Secondary | ICD-10-CM | POA: Diagnosis not present

## 2021-11-05 DIAGNOSIS — E039 Hypothyroidism, unspecified: Secondary | ICD-10-CM | POA: Insufficient documentation

## 2021-11-05 DIAGNOSIS — Z96642 Presence of left artificial hip joint: Secondary | ICD-10-CM | POA: Diagnosis not present

## 2021-11-05 DIAGNOSIS — M25552 Pain in left hip: Secondary | ICD-10-CM | POA: Diagnosis not present

## 2021-11-05 DIAGNOSIS — E440 Moderate protein-calorie malnutrition: Secondary | ICD-10-CM | POA: Diagnosis not present

## 2021-11-05 DIAGNOSIS — Y929 Unspecified place or not applicable: Secondary | ICD-10-CM | POA: Diagnosis not present

## 2021-11-05 DIAGNOSIS — U071 COVID-19: Secondary | ICD-10-CM | POA: Diagnosis not present

## 2021-11-05 DIAGNOSIS — F32A Depression, unspecified: Secondary | ICD-10-CM | POA: Diagnosis not present

## 2021-11-05 LAB — BASIC METABOLIC PANEL
Anion gap: 9 (ref 5–15)
BUN: 15 mg/dL (ref 8–23)
CO2: 24 mmol/L (ref 22–32)
Calcium: 8.9 mg/dL (ref 8.9–10.3)
Chloride: 103 mmol/L (ref 98–111)
Creatinine, Ser: 0.64 mg/dL (ref 0.44–1.00)
GFR, Estimated: >60 mL/min (ref >60)
Glucose, Bld: 145 mg/dL — ABNORMAL HIGH (ref 70–99)
Potassium: 3.4 mmol/L — ABNORMAL LOW (ref 3.5–5.1)
Sodium: 136 mmol/L (ref 135–145)

## 2021-11-05 LAB — CBC WITH DIFFERENTIAL/PLATELET
Abs Immature Granulocytes: 0.03 10*3/uL (ref 0.00–0.07)
Basophils Absolute: 0 10*3/uL (ref 0.0–0.1)
Basophils Relative: 0 %
Eosinophils Absolute: 0.2 10*3/uL (ref 0.0–0.5)
Eosinophils Relative: 2 %
HCT: 34.4 % — ABNORMAL LOW (ref 36.0–46.0)
Hemoglobin: 11.9 g/dL — ABNORMAL LOW (ref 12.0–15.0)
Immature Granulocytes: 0 %
Lymphocytes Relative: 13 %
Lymphs Abs: 1.2 10*3/uL (ref 0.7–4.0)
MCH: 32 pg (ref 26.0–34.0)
MCHC: 34.6 g/dL (ref 30.0–36.0)
MCV: 92.5 fL (ref 80.0–100.0)
Monocytes Absolute: 0.5 10*3/uL (ref 0.1–1.0)
Monocytes Relative: 6 %
Neutro Abs: 7.2 10*3/uL (ref 1.7–7.7)
Neutrophils Relative %: 79 %
Platelets: 220 10*3/uL (ref 150–400)
RBC: 3.72 MIL/uL — ABNORMAL LOW (ref 3.87–5.11)
RDW: 15.9 % — ABNORMAL HIGH (ref 11.5–15.5)
WBC: 9.2 10*3/uL (ref 4.0–10.5)
nRBC: 0 % (ref 0.0–0.2)

## 2021-11-05 MED ORDER — ONDANSETRON HCL 4 MG/2ML IJ SOLN
4.0000 mg | Freq: Once | INTRAMUSCULAR | Status: AC
  Administered 2021-11-05: 14:00:00 4 mg via INTRAVENOUS
  Filled 2021-11-05: qty 2

## 2021-11-05 MED ORDER — PROPOFOL 10 MG/ML IV BOLUS
INTRAVENOUS | Status: AC | PRN
  Administered 2021-11-05 (×2): 30 mg via INTRAVENOUS

## 2021-11-05 MED ORDER — OXYCODONE-ACETAMINOPHEN 5-325 MG PO TABS: 1.0000 | ORAL_TABLET | Freq: Three times a day (TID) | ORAL | 0 refills | Status: DC | PRN

## 2021-11-05 MED ORDER — FENTANYL CITRATE PF 50 MCG/ML IJ SOSY
50.0000 ug | PREFILLED_SYRINGE | Freq: Once | INTRAMUSCULAR | Status: AC
  Administered 2021-11-05: 14:00:00 50 ug via INTRAVENOUS
  Filled 2021-11-05: qty 1

## 2021-11-05 MED ORDER — PROPOFOL 10 MG/ML IV BOLUS
0.5000 mg/kg | Freq: Once | INTRAVENOUS | Status: AC
  Administered 2021-11-05: 15:00:00 30 mg via INTRAVENOUS
  Filled 2021-11-05: qty 20

## 2021-11-05 NOTE — ED Triage Notes (Signed)
Pt brought in by [facility] from home with c/o left hip pain after bending over while sitting in a chair to pick up an ornament. Denies falling. Pt reports she heard a pop when she bent over.

## 2021-11-05 NOTE — ED Notes (Signed)
Pt able to drink water and eat crackers independently  Denies N/V

## 2021-11-05 NOTE — ED Provider Notes (Signed)
[facility] EMERGENCY DEPARTMENT Provider Note   CSN: [id number] Arrival date & time: 11/05/21  1218     History Chief Complaint  Patient presents with   Hip Injury    Tasha Moss is a 85 y.o. female.  HPI  Patient with medical history including arthritis, GERD, hypertension, hyperlipidemia, bilateral hip replacements and 87 presents with complaint of left-sided hip pain.  Patient states today while she was sitting in a chair she went to pick something off the ground and felt something pop in her left hip.  She states that she had severe pain in her left hip which remains in that 1 area, slightly goes down her left leg but denies any paresthesias or weakness in her lower extremities.  She states she is unable to ambulate.  She denies actually falling, hitting her head, losing conscious.  She states that she is never had a hip dislocation in the past.  She states rest  make the pain better but movement makes the pain worse.  She has no other complaints this time.  Does not endorse fevers, chills, chest pain, shortness of breath,  Past Medical History:  Diagnosis Date   Allergy    seasonal & dust   Anxiety    Arthritis    DJD   Family history of adverse reaction to anesthesia    n/v    GERD (gastroesophageal reflux disease)    History of vertigo    Hyperlipidemia    Hypertension    Hypothyroidism    PONV (postoperative nausea and vomiting)    Thyroid disease    hypothyroidism post Hashimoto's & partial thyroidectomy    Patient Active Problem List   Diagnosis Date Noted   Elevated BP without diagnosis of hypertension 10/31/2020   Dizziness 10/31/2020   Status post reverse arthroplasty of left shoulder 03/11/2016   Depression with anxiety 03/11/2016   Cerumen impaction 02/10/2015   Routine general medical examination at a health care facility 06/29/2014   H/O thyroid nodule 06/21/2013   DIVERTICULOSIS, COLON 07/18/2010   ESOPHAGEAL REFLUX 04/09/2010   Stricture and  stenosis of esophagus 02/21/2010   DYSPHAGIA 02/09/2010   DEGENERATIVE JOINT DISEASE 11/22/2009   BENIGN PAROXYSMAL POSITIONAL VERTIGO 05/15/2009   HTN (hypertension) 09/28/2008   SPINAL STENOSIS, LUMBAR 09/28/2008   UNS ADVRS EFF UNS RX MEDICINAL&BIOLOGICAL SBSTNC 05/30/2008   PERNICIOUS ANEMIA 03/28/2008   Hypothyroidism 09/08/2007   HYPERLIPIDEMIA 09/08/2007   RESTLESS LEG SYNDROME 09/08/2007   HASHIMOTO'S THYROIDITIS 02/23/2007    Past Surgical History:  Procedure Laterality Date   CATARACT EXTRACTION W/ INTRAOCULAR LENS  IMPLANT, BILATERAL     COLONOSCOPY     diverticulosis   JOINT REPLACEMENT  1995 & 1999   THR bilaterally    lumbar disc repair  1989   Dr Arrie Aran   SPINE SURGERY     cervical ; Dr Vertell Limber   THYROIDECTOMY, PARTIAL     benign nodule   TONSILLECTOMY AND ADENOIDECTOMY     TOTAL SHOULDER ARTHROPLASTY Left 02/27/2016   Procedure: LEFT REVERSE TOTAL SHOULDER ARTHROPLASTY;  Surgeon: Renette Butters, MD;  Location: [place];  Service: Orthopedics;  Laterality: Left;   vitreous detachment     Dr Claudean Kinds     OB History   No obstetric history on file.     Family History  Problem Relation Age of Onset   Diabetes Mother    Hepatitis Mother        Hepatitis B,? etiology   Miscarriages / [place]  Mother    Stroke Mother 70   Heart disease Father        CHF   Cancer Paternal Aunt        GI cancers   Heart disease Paternal Aunt        CHF   Cancer Paternal Uncle        lung, GI   Heart disease Paternal Uncle        CHF    Social History   Tobacco Use   Smoking status: Never   Smokeless tobacco: Never  Vaping Use   Vaping Use: Never used  Substance Use Topics   Alcohol use: No   Drug use: No    Home Medications Prior to Admission medications   Medication Sig Start Date End Date Taking? Authorizing Provider  oxyCODONE-acetaminophen (PERCOCET/ROXICET) 5-325 MG tablet Take 1 tablet by mouth every 8 (eight) hours as needed for up to 3  days for severe pain. 11/05/21 11/08/21 Yes Marcello Fennel, PA-C  acetaminophen (TYLENOL) 500 MG tablet Take 500 mg by mouth every 6 (six) hours as needed.    [provider]  chlorhexidine (PERIDEX) 0.12 % solution Use as directed 15 mLs in the mouth or throat 2 (two) times daily.  04/13/20   [provider]  Cholecalciferol (VITAMIN D3) 1000 UNITS CAPS Take 1,000 Units by mouth daily.    [provider]  Cranberry 200 MG CAPS Take 1 capsule by mouth daily.    [provider]  fenofibrate 160 MG tablet Take 1 tablet by mouth once daily 08/27/21   [name], MD  FLUoxetine (PROZAC) 20 MG capsule Take 1 capsule by mouth once daily 10/15/21   [name], MD  glucosamine-chondroitin 500-400 MG tablet Take 1 tablet by mouth every other day.    [provider]  levothyroxine (SYNTHROID) 125 MCG tablet TAKE 1 TABLET BY MOUTH ONCE DAILY BEFORE BREAKFAST 10/11/21   [name], MD  loratadine (CLARITIN) 10 MG tablet Take 10 mg by mouth daily.    [provider]  meloxicam (MOBIC) 7.5 MG tablet Take 1 tablet by mouth twice daily 09/21/21   [name], MD  Multiple Vitamin (MULTIVITAMIN) tablet Take 1 tablet by mouth daily.    [provider]  pantoprazole (PROTONIX) 40 MG tablet Take 1 tablet by mouth once daily 09/05/21   [name], MD  pravastatin (PRAVACHOL) 40 MG tablet TAKE 1 TABLET BY MOUTH ONCE DAILY AT BEDTIME FOR CHOLESTEROL 10/01/21   [name], MD  propranolol ER (INDERAL LA) 60 MG 24 hr capsule Take 1 capsule (60 mg total) by mouth daily. 08/02/21   Kennyth Arnold, FNP  vitamin B-12 (CYANOCOBALAMIN) 250 MCG tablet Take 250 mcg by mouth daily.    [provider]    Allergies    Gabapentin, Other, and Morphine  Review of Systems   Review of Systems  Constitutional:  Negative for chills and fever.  HENT:  Negative for congestion.   Respiratory:  Negative for shortness  of breath.   Cardiovascular:  Negative for chest pain.  Gastrointestinal:  Negative for abdominal pain.  Genitourinary:  Negative for enuresis.  Musculoskeletal:  Negative for back pain.       Left hip pain  Skin:  Negative for rash.  Neurological:  Negative for dizziness.  Hematological:  Does not bruise/bleed easily.   Physical Exam Updated Vital Signs BP (!) 157/65   Pulse 73   Temp 98.4 F (  36.9 C) (Oral)   Resp (!) 23   Ht 5\' 5"  (1.651 m)   Wt 64.9 kg   SpO2 94%   BMI 23.80 kg/m   Physical Exam Vitals and nursing note reviewed.  Constitutional:      General: She is in acute distress.     Appearance: She is not ill-appearing.     Comments: Deconditioned state, uncomfortable appearance, vital signs normal for hypertension systolic 106/26.  HENT:     Head: Normocephalic and atraumatic.     Nose: No congestion.  Eyes:     Conjunctiva/sclera: Conjunctivae normal.  Cardiovascular:     Rate and Rhythm: Normal rate and regular rhythm.     Pulses: Normal pulses.     Heart sounds: No murmur heard.   No friction rub. No gallop.  Pulmonary:     Effort: No respiratory distress.     Breath sounds: No wheezing, rhonchi or rales.  Musculoskeletal:     Comments: Noted deformity of the left hip, with obvious left leg shortening and internal rotation, she has full range of motion of toes ankle and knee, unable to flex or side at the hip, tenderness to palpation, neurovascular fully intact.  Skin:    General: Skin is warm and dry.     Capillary Refill: Capillary refill takes less than 2 seconds.  Neurological:     Mental Status: She is alert.  Psychiatric:        Mood and Affect: Mood normal.    ED Results / Procedures / Treatments   Labs (all labs ordered are listed, but only abnormal results are displayed) Labs Reviewed  BASIC METABOLIC PANEL - Abnormal; Notable for the following components:      Result Value   Potassium 3.4 (*)    Glucose, Bld 145 (*)    All other  components within normal limits  CBC WITH DIFFERENTIAL/PLATELET - Abnormal; Notable for the following components:   RBC 3.72 (*)    Hemoglobin 11.9 (*)    HCT 34.4 (*)    RDW 15.9 (*)    All other components within normal limits    EKG None  Radiology DG Pelvis Portable  Result Date: 11/05/2021 CLINICAL DATA:  Post reduction left hip. EXAM: PORTABLE PELVIS 1-2 VIEWS COMPARISON:  Left hip x-ray 11/05/2021. FINDINGS: Left hip arthroplasty is now in anatomic alignment. There is no evidence for hardware loosening or acute fracture. Right hip arthroplasty is also in anatomic alignment. Degenerative changes affect the sacroiliac joints and pubic symphysis. IMPRESSION: 1. Left hip arthroplasty is in anatomic alignment. 2. No acute fracture. Electronically Signed   By: Ronney Asters M.D.   On: 11/05/2021 15:02   DG Hip Unilat With Pelvis 2-3 Views Left  Result Date: 11/05/2021 CLINICAL DATA:  Left hip pain after bending over. EXAM: DG HIP (WITH OR WITHOUT PELVIS) 2-3V LEFT COMPARISON:  CT of the abdomen and pelvis 07/16/2010 FINDINGS: Bilateral total hip arthroplasty again noted. The left the hip is dislocated superiorly and laterally. No associated fracture is present. Degenerative changes are present in the lower lumbar spine. IMPRESSION: Superior and lateral dislocation of the left hip. Electronically Signed   By: San Morelle M.D.   On: 11/05/2021 13:39    Procedures Reduction of dislocation  Date/Time: 11/05/2021 4:20 PM Performed by: Marcello Fennel, PA-C Authorized by: Marcello Fennel, PA-C  Consent: Verbal consent obtained. Risks and benefits: risks, benefits and alternatives were discussed Consent given by: patient and parent Patient identity confirmed: verbally  with patient Time out: Immediately prior to procedure a "time out" was called to verify the correct patient, procedure, equipment, support staff and site/side marked as required. Local anesthesia used:  no  Anesthesia: Local anesthesia used: no  Sedation: Patient sedated: yes Sedatives: propofol Vitals: Vital signs were monitored during sedation.  Patient tolerance: patient tolerated the procedure well with no immediate complications     Medications Ordered in ED Medications  ondansetron (ZOFRAN) injection 4 mg (4 mg Intravenous Given 11/05/21 1402)  fentaNYL (SUBLIMAZE) injection 50 mcg (50 mcg Intravenous Given 11/05/21 1403)  propofol (DIPRIVAN) 10 mg/mL bolus/IV push 32.5 mg (30 mg Intravenous Given 11/05/21 1439)  propofol (DIPRIVAN) 10 mg/mL bolus/IV push (30 mg Intravenous Given 11/05/21 1444)    ED Course  I have reviewed the triage vital signs and the nursing notes.  Pertinent labs & imaging results that were available during my care of the patient were reviewed by me and considered in my medical decision making (see chart for details).    MDM Rules/Calculators/A&P                          Initial impression-presents with left hip pain.  Alert appears in distress, vital signs are reassuring.  Likely hip dislocation, will obtain imaging, provide her with pain medications, obtain basic lab work and reassess.  Work-up-CBC shows normocytic anemia hemoglobin 1.9 at baseline, BMP shows potassium 3.4, glucose 145, DG of left hip reveals superior and lateral dislocation of the left hip.  Reassessment-hip is dislocated, since this is prostatic  will consult with orthopedic surgery.  Will recommend reduction for improved pain management.  Will discuss risk and benefits.  Patient is reassessed states she is feeling much better, risk benefits were discussed, all questions were answered, they are agreeable for proceeding with reduction of dislocation.  Dr. Aline Brochure recommends reduction of hip if any difficulty recommends reconsulting with him.  With Dr. Alvino Chapel presents conscious sedation was performed reduction of hip was completed.  She Toller procedure well, neurovascular  fully intact after procedure.  Post imaging shows successful reduction of hip.  Patient reassessed tolerating p.o., has no complaints, braces in place, she is ready for discharge.  Rule out-I have low suspicion for intracranial head bleed or CVA as there is no focal deficit present my exam, she denies hitting her head, losing conscious, is not on anticoagulant.  I have low suspicion for pelvic and/or femur fracture as imaging negative for acute findings.  Low suspicion for compartment syndrome as compartments are soft nontender to palpation, neurovascularly intact.  Plan-  Left hip dislocation-hip was reduced, knee brace was placed, provided them with pain medications, will make him nonweightbearing, follow-up with orthopedic surgery for further evaluation.  Gave strict return precautions.  Vital signs have remained stable, no indication for hospital admission. Patient given at home care as well strict return precautions.  Patient verbalized that they understood agreed to said plan.  Final Clinical Impression(s) / ED Diagnoses Final diagnoses:  Dislocation of left hip, initial encounter [facility])    Rx / DC Orders ED Discharge Orders          Ordered    oxyCODONE-acetaminophen (PERCOCET/ROXICET) 5-325 MG tablet  Every 8 hours PRN        11/05/21 [place]        11/05/21 1623    Face-to-face encounter (required for Medicare/Medicaid patients)       Comments: I Orson Ape  Ileene Patrick certify that this patient is under my care and that I, or a nurse practitioner or physician's assistant working with me, had a face-to-face encounter that meets the physician face-to-face encounter requirements with this patient on 11/05/2021. The encounter with the patient was in whole, or in part for the following medical condition(s) which is the primary reason for home health care (List medical condition): Had a posterior left hip dislocation which was reduced, has pain and unable to ambulate causing  severe gait disturbances.   11/05/21 [place], Calin Ellery J, PA-C 11/05/21 1629    Davonna Belling, MD 11/08/21 [phone]

## 2021-11-05 NOTE — Discharge Instructions (Signed)
We have reduced your hip, we placed you in a brace please wear at all times, I like you to remain nonweightbearing on your left leg.  I recommend applying ice to the area as this will decrease inflammation and pain. I have given you a short course of narcotics please take as prescribed.  This medication can make you drowsy do not consume alcohol or operate heavy machinery when taking this medication.  This medication is Tylenol in it do not take Tylenol and take this medication.    Do not cross the midline of the body with operated leg (use hip abduction pillow) Do not rotate the operated leg inward In bed, toes and knee cap should point toward ceiling Toe-touch or feather weight-bearing  Please follow-up with orthopedic surgery for further evaluation.  Please come back if symptoms worsen.

## 2021-11-06 ENCOUNTER — Inpatient Hospital Stay (HOSPITAL_COMMUNITY)
Admission: AD | Admit: 2021-11-06 | Discharge: 2021-11-21 | DRG: 559 | Disposition: A | Discharge status: Skilled Nursing Facility | Payer: Medicare Other | Source: Ambulatory Visit | Attending: Orthopedic Surgery | Admitting: Orthopedic Surgery

## 2021-11-06 ENCOUNTER — Other Ambulatory Visit: Payer: Self-pay | Admitting: Orthopedic Surgery

## 2021-11-06 ENCOUNTER — Other Ambulatory Visit: Payer: Self-pay

## 2021-11-06 DIAGNOSIS — K219 Gastro-esophageal reflux disease without esophagitis: Secondary | ICD-10-CM | POA: Diagnosis present

## 2021-11-06 DIAGNOSIS — E785 Hyperlipidemia, unspecified: Secondary | ICD-10-CM | POA: Diagnosis present

## 2021-11-06 DIAGNOSIS — L89301 Pressure ulcer of unspecified buttock, stage 1: Secondary | ICD-10-CM | POA: Diagnosis not present

## 2021-11-06 DIAGNOSIS — E039 Hypothyroidism, unspecified: Secondary | ICD-10-CM | POA: Diagnosis present

## 2021-11-06 DIAGNOSIS — I10 Essential (primary) hypertension: Secondary | ICD-10-CM | POA: Diagnosis present

## 2021-11-06 DIAGNOSIS — U071 COVID-19: Secondary | ICD-10-CM | POA: Diagnosis not present

## 2021-11-06 DIAGNOSIS — Z9841 Cataract extraction status, right eye: Secondary | ICD-10-CM | POA: Diagnosis not present

## 2021-11-06 DIAGNOSIS — F05 Delirium due to known physiological condition: Secondary | ICD-10-CM | POA: Diagnosis not present

## 2021-11-06 DIAGNOSIS — L89151 Pressure ulcer of sacral region, stage 1: Secondary | ICD-10-CM | POA: Diagnosis not present

## 2021-11-06 DIAGNOSIS — Z888 Allergy status to other drugs, medicaments and biological substances status: Secondary | ICD-10-CM | POA: Diagnosis not present

## 2021-11-06 DIAGNOSIS — M62838 Other muscle spasm: Secondary | ICD-10-CM | POA: Diagnosis present

## 2021-11-06 DIAGNOSIS — Z961 Presence of intraocular lens: Secondary | ICD-10-CM | POA: Diagnosis present

## 2021-11-06 DIAGNOSIS — E440 Moderate protein-calorie malnutrition: Secondary | ICD-10-CM | POA: Diagnosis present

## 2021-11-06 DIAGNOSIS — R1310 Dysphagia, unspecified: Secondary | ICD-10-CM | POA: Diagnosis not present

## 2021-11-06 DIAGNOSIS — G9341 Metabolic encephalopathy: Secondary | ICD-10-CM | POA: Diagnosis not present

## 2021-11-06 DIAGNOSIS — Z885 Allergy status to narcotic agent status: Secondary | ICD-10-CM

## 2021-11-06 DIAGNOSIS — Y792 Prosthetic and other implants, materials and accessory orthopedic devices associated with adverse incidents: Secondary | ICD-10-CM | POA: Diagnosis present

## 2021-11-06 DIAGNOSIS — Z96612 Presence of left artificial shoulder joint: Secondary | ICD-10-CM | POA: Diagnosis present

## 2021-11-06 DIAGNOSIS — T84021A Dislocation of internal left hip prosthesis, initial encounter: Secondary | ICD-10-CM | POA: Diagnosis present

## 2021-11-06 DIAGNOSIS — Z9842 Cataract extraction status, left eye: Secondary | ICD-10-CM

## 2021-11-06 DIAGNOSIS — E876 Hypokalemia: Secondary | ICD-10-CM | POA: Diagnosis not present

## 2021-11-06 DIAGNOSIS — M25552 Pain in left hip: Secondary | ICD-10-CM | POA: Diagnosis present

## 2021-11-06 DIAGNOSIS — R262 Difficulty in walking, not elsewhere classified: Secondary | ICD-10-CM | POA: Diagnosis present

## 2021-11-06 DIAGNOSIS — Z6823 Body mass index (BMI) 23.0-23.9, adult: Secondary | ICD-10-CM

## 2021-11-06 DIAGNOSIS — R4182 Altered mental status, unspecified: Secondary | ICD-10-CM | POA: Diagnosis not present

## 2021-11-06 DIAGNOSIS — Z823 Family history of stroke: Secondary | ICD-10-CM | POA: Diagnosis not present

## 2021-11-06 DIAGNOSIS — Z8249 Family history of ischemic heart disease and other diseases of the circulatory system: Secondary | ICD-10-CM

## 2021-11-06 DIAGNOSIS — Y929 Unspecified place or not applicable: Secondary | ICD-10-CM

## 2021-11-06 DIAGNOSIS — Z833 Family history of diabetes mellitus: Secondary | ICD-10-CM

## 2021-11-06 MED ORDER — VITAMIN D 25 MCG (1000 UNIT) PO TABS
1000.0000 [IU] | ORAL_TABLET | Freq: Every day | ORAL | Status: DC
  Administered 2021-11-07 – 2021-11-21 (×15): 1000 [IU] via ORAL
  Filled 2021-11-06 (×15): qty 1

## 2021-11-06 MED ORDER — POLYETHYLENE GLYCOL 3350 17 G PO PACK
17.0000 g | PACK | Freq: Every day | ORAL | Status: DC | PRN
  Administered 2021-11-13 – 2021-11-16 (×2): 17 g via ORAL
  Filled 2021-11-06 (×2): qty 1

## 2021-11-06 MED ORDER — ENOXAPARIN SODIUM 40 MG/0.4ML IJ SOSY
40.0000 mg | PREFILLED_SYRINGE | INTRAMUSCULAR | Status: DC
  Administered 2021-11-07 – 2021-11-21 (×15): 40 mg via SUBCUTANEOUS
  Filled 2021-11-06 (×15): qty 0.4

## 2021-11-06 MED ORDER — LORATADINE 10 MG PO TABS
10.0000 mg | ORAL_TABLET | Freq: Every day | ORAL | Status: DC
  Administered 2021-11-07 – 2021-11-21 (×15): 10 mg via ORAL
  Filled 2021-11-06 (×15): qty 1

## 2021-11-06 MED ORDER — PRAVASTATIN SODIUM 20 MG PO TABS
ORAL_TABLET | ORAL | Status: AC
  Filled 2021-11-06: qty 2

## 2021-11-06 MED ORDER — ACETAMINOPHEN 500 MG PO TABS: 500.0000 mg | ORAL_TABLET | Freq: Four times a day (QID) | ORAL | Status: DC

## 2021-11-06 MED ORDER — PANTOPRAZOLE SODIUM 40 MG PO TBEC
40.0000 mg | DELAYED_RELEASE_TABLET | Freq: Every day | ORAL | Status: DC
  Administered 2021-11-07 – 2021-11-17 (×11): 40 mg via ORAL
  Filled 2021-11-06 (×11): qty 1

## 2021-11-06 MED ORDER — ACETAMINOPHEN 500 MG PO TABS
500.0000 mg | ORAL_TABLET | Freq: Four times a day (QID) | ORAL | Status: AC
  Administered 2021-11-07 – 2021-11-10 (×12): 500 mg via ORAL
  Filled 2021-11-06 (×12): qty 1

## 2021-11-06 MED ORDER — MORPHINE SULFATE (PF) 2 MG/ML IV SOLN: 0.5000 mg | INTRAVENOUS | Status: DC | PRN

## 2021-11-06 MED ORDER — HYDROCODONE-ACETAMINOPHEN 7.5-325 MG PO TABS: 1.0000 | ORAL_TABLET | ORAL | Status: DC | PRN

## 2021-11-06 MED ORDER — ONDANSETRON HCL 4 MG PO TABS
4.0000 mg | ORAL_TABLET | Freq: Four times a day (QID) | ORAL | Status: DC | PRN
  Administered 2021-11-11 – 2021-11-21 (×6): 4 mg via ORAL
  Filled 2021-11-06 (×6): qty 1

## 2021-11-06 MED ORDER — LACTATED RINGERS IV SOLN
50.0000 mL/h | INTRAVENOUS | Status: DC
  Administered 2021-11-06 – 2021-11-09 (×3): 50 mL/h via INTRAVENOUS

## 2021-11-06 MED ORDER — FLUOXETINE HCL 20 MG PO CAPS
20.0000 mg | ORAL_CAPSULE | Freq: Every day | ORAL | Status: DC
  Administered 2021-11-07 – 2021-11-21 (×15): 20 mg via ORAL
  Filled 2021-11-06 (×15): qty 1

## 2021-11-06 MED ORDER — DOCUSATE SODIUM 100 MG PO CAPS
ORAL_CAPSULE | ORAL | Status: AC
  Filled 2021-11-06: qty 1

## 2021-11-06 MED ORDER — FLUTICASONE PROPIONATE 50 MCG/ACT NA SUSP
1.0000 | Freq: Every day | NASAL | Status: DC
  Administered 2021-11-08 – 2021-11-21 (×14): 1 via NASAL
  Filled 2021-11-06: qty 16

## 2021-11-06 MED ORDER — PRAVASTATIN SODIUM 20 MG PO TABS
40.0000 mg | ORAL_TABLET | Freq: Every day | ORAL | Status: DC
  Administered 2021-11-06 – 2021-11-20 (×15): 40 mg via ORAL
  Filled 2021-11-06 (×14): qty 2

## 2021-11-06 MED ORDER — HYDROCODONE-ACETAMINOPHEN 5-325 MG PO TABS
ORAL_TABLET | ORAL | Status: AC
  Filled 2021-11-06: qty 1

## 2021-11-06 MED ORDER — DOCUSATE SODIUM 100 MG PO CAPS
100.0000 mg | ORAL_CAPSULE | Freq: Two times a day (BID) | ORAL | Status: DC
  Administered 2021-11-06 – 2021-11-17 (×21): 100 mg via ORAL
  Filled 2021-11-06 (×20): qty 1

## 2021-11-06 MED ORDER — DIPHENHYDRAMINE HCL 12.5 MG/5ML PO ELIX: 12.5000 mg | ORAL_SOLUTION | ORAL | Status: DC | PRN

## 2021-11-06 MED ORDER — LEVOTHYROXINE SODIUM 125 MCG PO TABS
125.0000 ug | ORAL_TABLET | Freq: Every day | ORAL | Status: DC
  Administered 2021-11-07 – 2021-11-21 (×15): 125 ug via ORAL
  Filled 2021-11-06 (×15): qty 1

## 2021-11-06 MED ORDER — HYDROCODONE-ACETAMINOPHEN 5-325 MG PO TABS
1.0000 | ORAL_TABLET | ORAL | Status: DC | PRN
  Administered 2021-11-06: 1 via ORAL

## 2021-11-06 MED ORDER — FENOFIBRATE 160 MG PO TABS
160.0000 mg | ORAL_TABLET | Freq: Every day | ORAL | Status: DC
  Administered 2021-11-07 – 2021-11-21 (×15): 160 mg via ORAL
  Filled 2021-11-06 (×15): qty 1

## 2021-11-06 MED ORDER — PROPRANOLOL HCL ER 60 MG PO CP24
60.0000 mg | ORAL_CAPSULE | Freq: Every day | ORAL | Status: DC
  Administered 2021-11-07 – 2021-11-17 (×10): 60 mg via ORAL
  Filled 2021-11-06 (×11): qty 1

## 2021-11-06 MED ORDER — ONDANSETRON HCL 4 MG/2ML IJ SOLN
4.0000 mg | Freq: Four times a day (QID) | INTRAMUSCULAR | Status: DC | PRN
  Administered 2021-11-10 – 2021-11-17 (×5): 4 mg via INTRAVENOUS
  Filled 2021-11-06 (×5): qty 2

## 2021-11-06 NOTE — H&P (Signed)
ORTHOPAEDIC H&P  Chief Complaint: left hip pain  HPI: Tasha Moss is a 85 y.o. female who had hx of L THA 1995 and [place] had done well for many years in regards to her hip replacements until the other day when she bent over at home and felt a pop in her hip. She had immediate pain and inability to bear weight. She was taken to [facility] ED and was found to have a dislocation of her hip prosthesis. Closed reduction was performed and was discharged home with family. She presented this afternoon to my clinic as a new patient with her husband and daughter. Since reduction of the hip replacement she has had significant pain in the left leg, inability to bear weight weight, and her family is unable to take care of her at home given her current  condition.   Past Medical History:  Diagnosis Date   Allergy    seasonal & dust   Anxiety    Arthritis    DJD   Family history of adverse reaction to anesthesia    n/v    GERD (gastroesophageal reflux disease)    History of vertigo    Hyperlipidemia    Hypertension    Hypothyroidism    PONV (postoperative nausea and vomiting)    Thyroid disease    hypothyroidism post Hashimoto's & partial thyroidectomy   Past Surgical History:  Procedure Laterality Date   CATARACT EXTRACTION W/ INTRAOCULAR LENS  IMPLANT, BILATERAL     COLONOSCOPY     diverticulosis   JOINT REPLACEMENT  1995 & 1999   THR bilaterally    lumbar disc repair  1989   Dr Arrie Aran   SPINE SURGERY     cervical ; Dr Vertell Limber   THYROIDECTOMY, PARTIAL     benign nodule   TONSILLECTOMY AND ADENOIDECTOMY     TOTAL SHOULDER ARTHROPLASTY Left 02/27/2016   Procedure: LEFT REVERSE TOTAL SHOULDER ARTHROPLASTY;  Surgeon: Renette Butters, MD;  Location: [facility];  Service: Orthopedics;  Laterality: Left;   vitreous detachment     Dr Claudean Kinds   Social History   Socioeconomic History   Marital status: Married    Spouse name: Not on file   Number of children: 2   Years of  education: Not on file   Highest education level: Not on file  Occupational History   Not on file  Tobacco Use   Smoking status: Never   Smokeless tobacco: Never  Vaping Use   Vaping Use: Never used  Substance and Sexual Activity   Alcohol use: No   Drug use: No   Sexual activity: Not on file  Other Topics Concern   Not on file  Social History Narrative   Married for 68 years    Daughter-nurse   Daughter- paralegal       Right handed   Social Determinants of Health   Financial Resource Strain: Low Risk    Difficulty of Paying Living Expenses: Not hard at all  Food Insecurity: Not on file  Transportation Needs: Not on file  Physical Activity: Inactive   Days of Exercise per Week: 0 days   Minutes of Exercise per Session: 0 min  Stress: No Stress Concern Present   Feeling of Stress : Not at all  Social Connections: Socially Integrated   Frequency of Communication with Friends and Family: More than three times a week   Frequency of Social Gatherings with Friends and Family: Once a week  Attends Religious Services: More than 4 times per year   Active Member of Clubs or Organizations: Yes   Attends Archivist Meetings: 1 to 4 times per year   Marital Status: Married   Family History  Problem Relation Age of Onset   Diabetes Mother    Hepatitis Mother        Hepatitis B,? etiology   Miscarriages / Stillbirths Mother    Stroke Mother 21   Heart disease Father        CHF   Cancer Paternal Aunt        GI cancers   Heart disease Paternal Aunt        CHF   Cancer Paternal Uncle        lung, GI   Heart disease Paternal Uncle        CHF   Allergies  Allergen Reactions   Gabapentin     hallucinations   Other     Strong pain medications cause n/v   Morphine     Nausea & vomiting     Positive ROS: All other systems have been reviewed and were otherwise negative with the exception of those mentioned in the HPI and as above.  Physical Exam: General:  Alert, no acute distress Cardiovascular: No pedal edema Respiratory: No cyanosis, no use of accessory musculature Skin: No lesions in the area of chief complaint Neurologic: Sensation intact distally Psychiatric: Patient is competent for consent with normal mood and affect  MUSCULOSKELETAL:  LLE Leg lengths clinically equal on exam. Patient able to tolerate hip ROM 10-80 degrees of flexion, but significant discomfort with rotation of the hip No traumatic wounds, ecchymosis, or rash  Painless knee ROM  No knee or ankle effusion  Knee stable to varus/ valgus stress  Sens decreased in DPN and SPN;TN intact  Motor weak ankle dorsiflexion and eversion, intact plantarflexion  DP 2+, No significant edema   IMAGING: New xrays in clinic today showed that the hip remains concentrically reduced without fracture or other adverse features.   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 She and is not safe at home in her current condition. Patient was direct admitted for pain control and PT, and possible discharge to SNF. I feel like her pain at this point is likely 2/2 muscular trauma from the traumatic event as well as sciatic nerve injury from the posterior dislocation; however, If the patient unable to make progress with PT and pain, she may need additional workup of the hip during this admission. Additionally will order the patient a hip abduction brace to used instead of the knee immobilizer.  -WBAT LLE, posterior hip precautions.    Willaim Sheng, MD Cell [phone]

## 2021-11-07 ENCOUNTER — Encounter (HOSPITAL_COMMUNITY): Payer: Self-pay | Admitting: Orthopedic Surgery

## 2021-11-07 ENCOUNTER — Other Ambulatory Visit: Payer: Self-pay

## 2021-11-07 LAB — BASIC METABOLIC PANEL
Anion gap: 7 (ref 5–15)
BUN: 16 mg/dL (ref 8–23)
CO2: 30 mmol/L (ref 22–32)
Calcium: 8.5 mg/dL — ABNORMAL LOW (ref 8.9–10.3)
Chloride: 101 mmol/L (ref 98–111)
Creatinine, Ser: 0.65 mg/dL (ref 0.44–1.00)
GFR, Estimated: >60 mL/min (ref >60)
Glucose, Bld: 109 mg/dL — ABNORMAL HIGH (ref 70–99)
Potassium: 2.9 mmol/L — ABNORMAL LOW (ref 3.5–5.1)
Sodium: 138 mmol/L (ref 135–145)

## 2021-11-07 LAB — CBC
HCT: 30.4 % — ABNORMAL LOW (ref 36.0–46.0)
Hemoglobin: 10.3 g/dL — ABNORMAL LOW (ref 12.0–15.0)
MCH: 31 pg (ref 26.0–34.0)
MCHC: 33.9 g/dL (ref 30.0–36.0)
MCV: 91.6 fL (ref 80.0–100.0)
Platelets: 187 10*3/uL (ref 150–400)
RBC: 3.32 MIL/uL — ABNORMAL LOW (ref 3.87–5.11)
RDW: 15.8 % — ABNORMAL HIGH (ref 11.5–15.5)
WBC: 6.9 10*3/uL (ref 4.0–10.5)
nRBC: 0 % (ref 0.0–0.2)

## 2021-11-07 LAB — C-REACTIVE PROTEIN: CRP: 4.9 mg/dL — ABNORMAL HIGH (ref <1.0)

## 2021-11-07 LAB — SEDIMENTATION RATE: Sed Rate: 14 mm/hr (ref 0–22)

## 2021-11-07 MED ORDER — TRAMADOL HCL 50 MG PO TABS
50.0000 mg | ORAL_TABLET | Freq: Four times a day (QID) | ORAL | Status: DC | PRN
  Administered 2021-11-09 – 2021-11-16 (×15): 50 mg via ORAL
  Filled 2021-11-07 (×16): qty 1

## 2021-11-07 MED ORDER — HYDROCODONE-ACETAMINOPHEN 7.5-325 MG PO TABS
1.0000 | ORAL_TABLET | ORAL | Status: DC | PRN
  Administered 2021-11-07 – 2021-11-14 (×10): 1 via ORAL
  Filled 2021-11-07 (×10): qty 1

## 2021-11-07 MED ORDER — CYCLOBENZAPRINE HCL 5 MG PO TABS
5.0000 mg | ORAL_TABLET | Freq: Three times a day (TID) | ORAL | Status: DC
  Administered 2021-11-07 – 2021-11-17 (×25): 5 mg via ORAL
  Filled 2021-11-07 (×27): qty 1

## 2021-11-07 MED ORDER — KETOROLAC TROMETHAMINE 15 MG/ML IJ SOLN
15.0000 mg | Freq: Three times a day (TID) | INTRAMUSCULAR | Status: DC
  Administered 2021-11-07 – 2021-11-09 (×7): 15 mg via INTRAVENOUS
  Filled 2021-11-07 (×7): qty 1

## 2021-11-07 NOTE — Evaluation (Signed)
Physical Therapy Evaluation Patient Details Name: Tasha Moss MRN: [id number] DOB: [birth year] Today's Date: 11/07/2021  History of Present Illness  Pt is an 85 year old female admitted 11/06/21 with hx of L THA performed 1995 with closed hip dislocation 11/28 and currently admited due to limited mobility and poor pain control.  PMHx significant for reverse total shoulder arthroplasty 2017, arthritis, GERD, hypertension, hyperlipidemia, bilateral hip replacements, vertigo  Clinical Impression  Pt admitted with above diagnosis. Pt currently with functional limitations due to the deficits listed below (see PT Problem List). Pt will benefit from skilled PT to increase their independence and safety with mobility to allow discharge to the venue listed below.  Pt assisted with sitting EOB and then transferring to recliner.  Pt felt unable to tolerate ambulation today due to pain.  Pt educated on posterior hip precautions and maintaining during mobility.  Pt reports her daughter stated she could no longer physically assist her at home so pt will likely need SNF upon d/c to improve independence with mobility, increase strength, and practice maintaining precautions with hip abduction brace in place.      Recommendations for follow up therapy are one component of a multi-disciplinary discharge planning process, led by the attending physician.  Recommendations may be updated based on patient status, additional functional criteria and insurance authorization.  Follow Up Recommendations Skilled nursing-short term rehab (<3 hours/day)    Assistance Recommended at Discharge Frequent or constant Supervision/Assistance  Functional Status Assessment Patient has had a recent decline in their functional status and demonstrates the ability to make significant improvements in function in a reasonable and predictable amount of time.  Equipment Recommendations  None recommended by PT    Recommendations for Other  Services       Precautions / Restrictions Precautions Precautions: Fall;Posterior Hip Precaution Comments: reviewed and demonstrated posterior hip precautions Required Braces or Orthoses: Other Brace Other Brace: hip abduction brace Restrictions Weight Bearing Restrictions: No Other Position/Activity Restrictions: WBAT      Mobility  Bed Mobility Overal bed mobility: Needs Assistance Bed Mobility: Supine to Sit     Supine to sit: Mod assist;HOB elevated     General bed mobility comments: assist for Lt LE support coming over EOB and scooting to EOB    Transfers Overall transfer level: Needs assistance Equipment used: Rolling walker (2 wheels) Transfers: Sit to/from Stand;Bed to chair/wheelchair/BSC Sit to Stand: Mod assist;+2 physical assistance;From elevated surface   Step pivot transfers: Mod assist;+2 physical assistance       General transfer comment: multimodal cues for technique and precautions; pt requiring 2 person assist, assist for weakness and stability, max cues for using UEs through RW to better self assist    Ambulation/Gait               General Gait Details: pt unable at this time  Stairs            Wheelchair Mobility    Modified Rankin (Stroke Patients Only)       Balance Overall balance assessment: Needs assistance         Standing balance support: Bilateral upper extremity supported;Reliant on assistive device for balance Standing balance-Leahy Scale: Poor                               Pertinent Vitals/Pain Pain Assessment: Faces Faces Pain Scale: Hurts even more Pain Location: left hip Pain Descriptors / Indicators: Aching;Sore;Grimacing Pain Intervention(s):  Repositioned;Monitored during session    [place] expects to be discharged to:: Private residence Living Arrangements: Spouse/significant other;Children Available Help at Discharge: Family   Home Access: [place]: Laundry or work area in basement;Able to live on main level with bedroom/bathroom Home Equipment: Conservation officer, nature (2 wheels);Wheelchair - manual;BSC/3in1;Adaptive equipment;Cane - single point;Rollator (4 wheels)      Prior Function Prior Level of Function : Independent/Modified Independent             Mobility Comments: reports her daughter has been lifting her after recent dislocation, typically able to use a RW and ambulate around home, need help with IADLs       Hand Dominance        Extremity/Trunk Assessment   Upper Extremity Assessment Upper Extremity Assessment: LUE deficits/detail LUE Deficits / Details: reports left shoulder deficits after prior shoulder surgery    Lower Extremity Assessment Lower Extremity Assessment: Generalized weakness;LLE deficits/detail LLE Deficits / Details: reviewed and maintained posterior hip precautions, hip abd brace in place       Communication   Communication: No difficulties  Cognition Arousal/Alertness: Awake/alert Behavior During Therapy: WFL for tasks assessed/performed Overall Cognitive Status: Within Functional Limits for tasks assessed                                 General Comments: pt states she is having some word finding difficulty however feels it's from the pain meds        General Comments      Exercises     Assessment/Plan    PT Assessment Patient needs continued PT services  PT Problem List Decreased strength;Decreased mobility;Decreased activity tolerance;Decreased balance;Decreased knowledge of use of DME;Pain;Decreased knowledge of precautions       PT Treatment Interventions Gait training;DME instruction;Therapeutic exercise;Balance training;Functional mobility training;Therapeutic activities;Patient/family education    PT Goals (Current goals can be found in the Care Plan section)  Acute Rehab PT Goals PT Goal Formulation: With patient Time For Goal Achievement:  11/14/21 Potential to Achieve Goals: Good    Frequency Min 3X/week   Barriers to discharge        Co-evaluation               AM-PAC PT "6 Clicks" Mobility  Outcome Measure Help needed turning from your back to your side while in a flat bed without using bedrails?: A Lot Help needed moving from lying on your back to sitting on the side of a flat bed without using bedrails?: A Lot Help needed moving to and from a bed to a chair (including a wheelchair)?: A Lot Help needed standing up from a chair using your arms (e.g., wheelchair or bedside chair)?: A Lot Help needed to walk in hospital room?: Total Help needed climbing 3-5 steps with a railing? : Total 6 Click Score: 10    End of Session Equipment Utilized During Treatment: Gait belt Activity Tolerance: Patient tolerated treatment well Patient left: in chair;with call bell/phone within reach;with chair alarm set Nurse Communication: Mobility status PT Visit Diagnosis: Muscle weakness (generalized) (M62.81);Unsteadiness on feet (R26.81);Difficulty in walking, not elsewhere classified (R26.2);Pain Pain - Right/Left: Left Pain - part of body: Hip    Time: 6644-0347 PT Time Calculation (min) (ACUTE ONLY): 37 min   Charges:   PT Evaluation $PT Eval Low Complexity: 1 Low PT Treatments $Therapeutic Activity: 8-22 mins  Jannette Spanner PT, DPT Acute Rehabilitation Services Pager: [phone] Office: [place] 11/07/2021, 1:10 PM

## 2021-11-07 NOTE — NC FL2 (Signed)
[place] LEVEL OF CARE SCREENING TOOL     IDENTIFICATION  Patient Name: Tasha Moss Birthdate: [birth year] Sex: female Admission Date (Current Location): 11/06/2021  [facility] and [place] Number:  Herbalist and Address:  [facility],  [place], [place]      Provider Number: [id number]  Attending Physician Name and Address:  Willaim Sheng, MD  Relative Name and Phone Number:  daughter, Moria Brophy [phone]    Current Level of Care: Hospital Recommended Level of Care: [place] Prior Approval Number:    Date Approved/Denied:   PASRR Number: [id number]  Discharge Plan: SNF    Current Diagnoses: Patient Active Problem List   Diagnosis Date Noted   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 ([place]) 11/06/2021   Elevated BP without diagnosis of hypertension 10/31/2020   Dizziness 10/31/2020   Status post reverse arthroplasty of left shoulder 03/11/2016   Depression with anxiety 03/11/2016   Cerumen impaction 02/10/2015   Routine general medical examination at a health care facility 06/29/2014   H/O thyroid nodule 06/21/2013   DIVERTICULOSIS, COLON 07/18/2010   ESOPHAGEAL REFLUX 04/09/2010   Stricture and stenosis of esophagus 02/21/2010   DYSPHAGIA 02/09/2010   DEGENERATIVE JOINT DISEASE 11/22/2009   BENIGN PAROXYSMAL POSITIONAL VERTIGO 05/15/2009   HTN (hypertension) 09/28/2008   SPINAL STENOSIS, LUMBAR 09/28/2008   UNS ADVRS EFF UNS RX MEDICINAL&BIOLOGICAL SBSTNC 05/30/2008   PERNICIOUS ANEMIA 03/28/2008   Hypothyroidism 09/08/2007   HYPERLIPIDEMIA 09/08/2007   RESTLESS LEG SYNDROME 09/08/2007   HASHIMOTO'S THYROIDITIS 02/23/2007    Orientation RESPIRATION BLADDER Height & Weight     Self, Time, Situation, Place  Normal Continent Weight: 143 lb 1.3 oz (64.9 kg) Height:  5\' 5"  (165.1 cm)  BEHAVIORAL SYMPTOMS/MOOD NEUROLOGICAL BOWEL NUTRITION STATUS       Continent    AMBULATORY STATUS COMMUNICATION OF NEEDS Skin   Extensive Assist   Normal                       Personal Care Assistance Level of Assistance  Bathing, Dressing Bathing Assistance: Limited assistance   Dressing Assistance: Limited assistance     Functional Limitations Info  Hearing   Hearing Info: Impaired (hearing aids)      [place]  PT (By licensed PT), OT (By licensed OT)     PT Frequency: 5x/wk OT Frequency: 5x/wk            Contractures Contractures Info: Not present    Additional Factors Info  Code Status, Allergies Code Status Info: Full Allergies Info: Gabapentin, Other, Morphine           Current Medications (11/07/2021):  This is the current hospital active medication list Current Facility-Administered Medications  Medication Dose Route Frequency Provider Last Rate Last Admin   acetaminophen (TYLENOL) tablet 500 mg  500 mg Oral Q6H Willaim Sheng, MD   500 mg at 11/07/21 1259   cholecalciferol (VITAMIN D3) tablet 1,000 Units  1,000 Units Oral Daily Willaim Sheng, MD   1,000 Units at 11/07/21 0935   cyclobenzaprine (FLEXERIL) tablet 5 mg  5 mg Oral TID Willaim Sheng, MD   5 mg at 11/07/21 0935   diphenhydrAMINE (BENADRYL) 12.5 MG/5ML elixir 12.5-25 mg  12.5-25 mg Oral Q4H PRN McBane, Maylene Roes, PA-C       docusate sodium (COLACE) capsule 100 mg  100 mg Oral BID Noemi Chapel  N, PA-C   100 mg at 11/07/21 0934   enoxaparin (LOVENOX) injection 40 mg  40 mg Subcutaneous Q24H Tanique, Matney, PA-C   40 mg at 11/07/21 0935   fenofibrate tablet 160 mg  160 mg Oral Daily Willaim Sheng, MD   160 mg at 11/07/21 0935   FLUoxetine (PROZAC) capsule 20 mg  20 mg Oral Daily Willaim Sheng, MD   20 mg at 11/07/21 0935   fluticasone (FLONASE) 50 MCG/ACT nasal spray 1 spray  1 spray Each Nare Daily Willaim Sheng, MD       HYDROcodone-acetaminophen (NORCO) 7.5-325 MG per tablet 1 tablet  1  tablet Oral Q4H PRN Willaim Sheng, MD       ketorolac (TORADOL) 15 MG/ML injection 15 mg  15 mg Intravenous Q8H Willaim Sheng, MD   15 mg at 11/07/21 1259   lactated ringers infusion  50 mL/hr Intravenous Continuous Quinta, Eimer, PA-C 50 mL/hr at 11/06/21 2315 50 mL/hr at 11/06/21 2315   levothyroxine (SYNTHROID) tablet 125 mcg  125 mcg Oral Q0600 Willaim Sheng, MD   125 mcg at 11/07/21 2111   loratadine (CLARITIN) tablet 10 mg  10 mg Oral Daily Willaim Sheng, MD   10 mg at 11/07/21 0935   ondansetron (ZOFRAN) tablet 4 mg  4 mg Oral Q6H PRN Ethelda Chick, PA-C       Or   ondansetron (ZOFRAN) injection 4 mg  4 mg Intravenous Q6H PRN McBane, Caroline N, PA-C       pantoprazole (PROTONIX) EC tablet 40 mg  40 mg Oral Daily Willaim Sheng, MD   40 mg at 11/07/21 0935   polyethylene glycol (MIRALAX / GLYCOLAX) packet 17 g  17 g Oral Daily PRN McBane, Maylene Roes, PA-C       pravastatin (PRAVACHOL) tablet 40 mg  40 mg Oral QHS Willaim Sheng, MD   40 mg at 11/06/21 2242   propranolol ER (INDERAL LA) 24 hr capsule 60 mg  60 mg Oral Daily Willaim Sheng, MD   60 mg at 11/07/21 0934   traMADol (ULTRAM) tablet 50 mg  50 mg Oral Q6H PRN Willaim Sheng, MD         Discharge Medications: Please see discharge summary for a list of discharge medications.  Relevant Imaging Results:  Relevant Lab Results:   Additional Information SSN [id number]; wearing him abduction brace  Luretha Eberly, LCSW

## 2021-11-07 NOTE — Progress Notes (Signed)
     Subjective:  Patient lying comfortably in bed today.  Seem to respond well overnight to as needed pain pain medications.  She reports feeling muscle spasm type pain in the thigh area and glutes.  Objective:   VITALS:   Vitals:   11/06/21 2300 11/07/21 0017 11/07/21 0148 11/07/21 0449  BP: (!) 181/56 (!) 125/53 (!) 145/57 (!) 140/57  Pulse: 70 70 68 65  Resp: 16 18 16 16   Temp: 98.6 F (37 C)  98.9 F (37.2 C) 98.2 F (36.8 C)  TempSrc: Oral  Oral Oral  SpO2: 96% (!) 89% 93% 95%    Left leg knee immobilizer in place. Tolerates gentle range of motion of the hip today including rotation and abduction much better than in clinic yesterday. Intact ankle dorsiflexion plantarflexion.  Lab Results  Component Value Date   WBC 6.9 11/07/2021   HGB 10.3 (L) 11/07/2021   HCT 30.4 (L) 11/07/2021   MCV 91.6 11/07/2021   PLT 187 11/07/2021   BMET    Component Value Date/Time   NA 138 11/07/2021 0326   NA 142 02/19/2016 0000   K 2.9 (L) 11/07/2021 0326   CL 101 11/07/2021 0326   CO2 30 11/07/2021 0326   GLUCOSE 109 (H) 11/07/2021 0326   BUN 16 11/07/2021 0326   BUN 13 02/19/2016 0000   CREATININE 0.65 11/07/2021 0326   CREATININE 0.56 (L) 11/10/2019 0931   CALCIUM 8.5 (L) 11/07/2021 0326   GFRNONAA >60 11/07/2021 0326     Assessment/Plan:  L THA performed 1995 with closed hip dislocation 11/28 now with limited mobility and poor pain control Multimodal pain control: Tylenol, Toradol, Flexeril, tramadol Weightbearing as tolerated posterior hip precautions PT/OT Hip abduction orthoses to be worn instead of the knee immobilizer has been ordered Depending on progress with PT patient may benefit from discharge to skilled nursing facility.       Jazsmine Macari A Daleiza Bacchi 11/07/2021, 6:31 AM   Charlies Constable, MD Cell [phone]  Contact information:   [id number] 7am-5pm epic message Dr. Zachery Dakins, or call office for patient follow up: [phone] After hours  and holidays please check [email or web address] for group call information for [facility]

## 2021-11-07 NOTE — Plan of Care (Signed)
  Problem: Pain Managment: Goal: General experience of comfort will improve Outcome: Progressing   Problem: Elimination: Goal: Will not experience complications related to urinary retention Outcome: Progressing   Problem: Coping: Goal: Level of anxiety will decrease Outcome: Progressing   Problem: Elimination: Goal: Will not experience complications related to urinary retention Outcome: Progressing

## 2021-11-07 NOTE — TOC Initial Note (Signed)
Transition of Care [facility]) - Initial/Assessment Note    Patient Details  Name: Tasha Moss MRN: [id number] Date of Birth: Jan 10, 1936  Transition [facility]) CM/SW Contact:    Lennart Pall, LCSW Phone Number: 11/07/2021, 2:53 PM  Clinical Narrative:                 Met with pt and spouse today to introduce self / TOC role and begin dc planning discussion.  Both very pleasant but admit much frustration with continued issues with pt's hip and limitations.  Pt working with PT here but unsure if any further ortho intervention is planned.  Pt and spouse report that they do feel the need for SNF rehab again and agree with TOC beginning the process.  Pt would, also, like for me to touch base with daughter, [name] on facility preferences.  Continue to follow.  Expected Discharge Plan: Skilled Nursing Facility Barriers to Discharge: Continued Medical Work up, Ship broker, SNF Pending bed offer   Patient Goals and CMS Choice Patient states their goals for this hospitalization and ongoing recovery are:: to be able to mobilize at home      Expected Discharge Plan and Services Expected Discharge Plan: [place] In-house Referral: Clinical Social Work   Post Acute Care Choice: [place] Living arrangements for the past 2 months: [place]                                      Prior Living Arrangements/Services Living arrangements for the past 2 months: [place] with:: Spouse Patient language and need for interpreter reviewed:: Yes Do you feel safe going back to the place where you live?: Yes      Need for Family Participation in Patient Care: Yes (Comment) Care giver support system in place?: Yes (comment)   Criminal Activity/Legal Involvement Pertinent to Current Situation/Hospitalization: No - Comment as needed  Activities of Daily Living Home Assistive Devices/Equipment: Cane (specify quad or straight), Walker  (specify type) ADL Screening (condition at time of admission) Patient's cognitive ability adequate to safely complete daily activities?: Yes Is the patient deaf or have difficulty hearing?: Yes Does the patient have difficulty seeing, even when wearing glasses/contacts?: No Does the patient have difficulty concentrating, remembering, or making decisions?: No Patient able to express need for assistance with ADLs?: Yes Does the patient have difficulty dressing or bathing?: Yes Independently performs ADLs?: No Communication: Independent Dressing (OT): Needs assistance Is this a change from baseline?: Pre-admission baseline Grooming: Needs assistance Is this a change from baseline?: Pre-admission baseline Feeding: Independent Bathing: Needs assistance Is this a change from baseline?: Pre-admission baseline Toileting: Needs assistance Is this a change from baseline?: Pre-admission baseline In/Out Bed: Needs assistance Is this a change from baseline?: Pre-admission baseline Walks in Home: Needs assistance Is this a change from baseline?: Pre-admission baseline Does the patient have difficulty walking or climbing stairs?: Yes Weakness of Legs: Both Weakness of Arms/Hands: None  Permission Sought/Granted Permission sought to share information with : Family Supports Permission granted to share information with : Yes, Verbal Permission Granted  Share Information with NAME: Jakyrah Holladay     Permission granted to share info w Relationship: daughter  Permission granted to share info w Contact Information: [phone]  Emotional Assessment Appearance:: Appears stated age Attitude/Demeanor/Rapport: Gracious Affect (typically observed): Accepting Orientation: : Oriented to Self, Oriented to Place, Oriented to  Time, Oriented to Situation Alcohol / Substance Use: Not Applicable Psych Involvement: No (comment)  Admission diagnosis:  Failure of left total hip arthroplasty with dislocation of  hip [facility]) [U38.333O] Patient Active Problem List   Diagnosis Date Noted   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 ([place]) 11/06/2021   Elevated BP without diagnosis of hypertension 10/31/2020   Dizziness 10/31/2020   Status post reverse arthroplasty of left shoulder 03/11/2016   Depression with anxiety 03/11/2016   Cerumen impaction 02/10/2015   Routine general medical examination at a health care facility 06/29/2014   H/O thyroid nodule 06/21/2013   DIVERTICULOSIS, COLON 07/18/2010   ESOPHAGEAL REFLUX 04/09/2010   Stricture and stenosis of esophagus 02/21/2010   DYSPHAGIA 02/09/2010   DEGENERATIVE JOINT DISEASE 11/22/2009   BENIGN PAROXYSMAL POSITIONAL VERTIGO 05/15/2009   HTN (hypertension) 09/28/2008   SPINAL STENOSIS, LUMBAR 09/28/2008   UNS ADVRS EFF UNS RX MEDICINAL&BIOLOGICAL SBSTNC 05/30/2008   PERNICIOUS ANEMIA 03/28/2008   Hypothyroidism 09/08/2007   HYPERLIPIDEMIA 09/08/2007   RESTLESS LEG SYNDROME 09/08/2007   HASHIMOTO'S THYROIDITIS 02/23/2007   PCP:  [name], MD Pharmacy:   [facility] [address] [place] [address] [place] [place] [address] Phone: [phone] Fax: [phone]     Social Determinants of Health (SDOH) Interventions    Readmission Risk Interventions No flowsheet data found.

## 2021-11-07 NOTE — Progress Notes (Signed)
Orthopedic Tech Progress Note Patient Details:  Tasha Moss 1936/04/02 [id number]  Called in order to [facility] for a [place].   Patient ID: Tasha Moss, female   DOB: [birth year], 85 y.o.   MRN: [id number]  [name] 11/07/2021, 8:08 AM

## 2021-11-08 ENCOUNTER — Inpatient Hospital Stay (HOSPITAL_COMMUNITY): Payer: Medicare Other

## 2021-11-08 LAB — BASIC METABOLIC PANEL
Anion gap: 7 (ref 5–15)
BUN: 16 mg/dL (ref 8–23)
CO2: 28 mmol/L (ref 22–32)
Calcium: 8.1 mg/dL — ABNORMAL LOW (ref 8.9–10.3)
Chloride: 103 mmol/L (ref 98–111)
Creatinine, Ser: 0.6 mg/dL (ref 0.44–1.00)
GFR, Estimated: >60 mL/min (ref >60)
Glucose, Bld: 104 mg/dL — ABNORMAL HIGH (ref 70–99)
Potassium: 3.1 mmol/L — ABNORMAL LOW (ref 3.5–5.1)
Sodium: 138 mmol/L (ref 135–145)

## 2021-11-08 NOTE — Plan of Care (Signed)

## 2021-11-08 NOTE — ED Provider Notes (Signed)
  Physical Exam  BP (!) 160/80 (BP Location: Left Arm)   Pulse 79   Temp 98.7 F (37.1 C) (Oral)   Resp 20   Ht 5\' 5"  (1.651 m)   Wt 64.9 kg   SpO2 95%   BMI 23.80 kg/m   Physical Exam  ED Course/Procedures     .Sedation  Date/Time: 11/05/2021 2:45 PM Performed by: Davonna Belling, MD Authorized by: Davonna Belling, MD   Consent:    Consent obtained:  Written Universal protocol:    Immediately prior to procedure, a time out was called: yes     Patient identity confirmed:  Arm band and verbally with patient Indications:    Procedure performed:  Dislocation reduction   Procedure necessitating sedation performed by:  Physician performing sedation Pre-sedation assessment:    Time since last food or drink:  4 hrs   ASA classification: class 3 - patient with severe systemic disease     Mouth opening:  2 finger widths   Thyromental distance:  3 finger widths   Mallampati score:  II - soft palate, uvula, fauces visible   Neck mobility: normal     Pre-sedation assessments completed and reviewed: airway patency, cardiovascular function, hydration status, mental status, pain level, respiratory function and temperature     Pre-sedation assessments completed and reviewed: pre-procedure nausea and vomiting status not reviewed   Immediate pre-procedure details:    Reassessment: Patient reassessed immediately prior to procedure     Reviewed: vital signs     Verified: bag valve mask available, emergency equipment available, intubation equipment available, IV patency confirmed, oxygen available and suction available   Procedure details (see MAR for exact dosages):    Preoxygenation:  Nasal cannula   Sedation:  Propofol   Intended level of sedation: deep   Analgesia:  Fentanyl   Intra-procedure monitoring:  Blood pressure monitoring, frequent LOC assessments, cardiac monitor, continuous pulse oximetry and frequent vital sign checks   Intra-procedure events: none     Total Provider  sedation time (minutes):  10 Post-procedure details:    Attendance: Constant attendance by certified staff until patient recovered     Recovery: Patient returned to pre-procedure baseline     Patient is stable for discharge or admission: yes     Procedure completion:  Tolerated well, no immediate complications  MDM         Davonna Belling, MD 11/08/21 [phone]

## 2021-11-08 NOTE — Progress Notes (Signed)
     Subjective:  Patient lying comfortably in bed today. She notes some confusion overnight but otherwise doing okay. She was able to stand and pivot yesterday with physical therapy, and tolerates more range of motion today. Fitted for abduction brace.  Objective:   VITALS:   Vitals:   11/07/21 1034 11/07/21 1310 11/07/21 2055 11/08/21 0508  BP: 123/64 (!) 160/67 (!) 145/58 (!) 170/68  Pulse: 74 72 68 71  Resp: 18 18 16 16   Temp: 98.7 F (37.1 C) 98.5 F (36.9 C) 98 F (36.7 C) 98 F (36.7 C)  TempSrc: Oral Oral Oral Oral  SpO2: 90% 95% 93% 96%  Weight: 64.9 kg     Height: 5\' 5"  (1.651 m)       Left leg hip abduction brace in place. Tolerates gentle range of motion of the hip today including rotation and abduction much better than in clinic yesterday. Intact ankle dorsiflexion plantarflexion.  Lab Results  Component Value Date   WBC 6.9 11/07/2021   HGB 10.3 (L) 11/07/2021   HCT 30.4 (L) 11/07/2021   MCV 91.6 11/07/2021   PLT 187 11/07/2021   BMET    Component Value Date/Time   NA 138 11/08/2021 0258   NA 142 02/19/2016 0000   K 3.1 (L) 11/08/2021 0258   CL 103 11/08/2021 0258   CO2 28 11/08/2021 0258   GLUCOSE 104 (H) 11/08/2021 0258   BUN 16 11/08/2021 0258   BUN 13 02/19/2016 0000   CREATININE 0.60 11/08/2021 0258   CREATININE 0.56 (L) 11/10/2019 0931   CALCIUM 8.1 (L) 11/08/2021 0258   GFRNONAA >60 11/08/2021 0258     Assessment/Plan:  L THA performed 1995 with closed hip dislocation 11/28 now with limited mobility and poor pain control Multimodal pain control: Tylenol, Toradol, Flexeril, tramadol Weightbearing as tolerated posterior hip precautions - hip abduction brace applied 11/30 Given slow progress with PT and continued pain will obtain CT scan of the left hip to rule out fracture and make sure hip is concentrically seated. PT/OT Depending on progress with PT patient may benefit from discharge to skilled nursing facility.       Avory Mimbs A  Tydarius Yawn 11/08/2021, 7:11 AM   Charlies Constable, MD Cell [phone]  Contact information:   [id number] 7am-5pm epic message Dr. Zachery Dakins, or call office for patient follow up: [phone] After hours and holidays please check [email or web address] for group call information for [facility]

## 2021-11-08 NOTE — TOC Progression Note (Signed)
Transition of Care [facility]) - Progression Note    Patient Details  Name: Tasha Moss MRN: [id number] Date of Birth: 1936/08/11  Transition [facility]) CM/SW Contact  Lennart Pall, LCSW Phone Number: 11/08/2021, 10:33 AM  Clinical Narrative:    Met with pt, spouse and daughter this morning to review SNF process and facility preferences. Currently awaiting CT to determine if any further ortho intervention is needed.  Pt and family report that their preferred SNFs would be [facility] ([facility]).  I have reached out to these facilities to alert them to pt's case for consideration.  TOC will continue to follow.   Expected Discharge Plan: [place] Barriers to Discharge: Continued Medical Work up, Ship broker, SNF Pending bed offer  Expected Discharge Plan and Services Expected Discharge Plan: [place] In-house Referral: Clinical Social Work   Post Acute Care Choice: [place] Living arrangements for the past 2 months: Single Family Home                                       Social Determinants of Health (SDOH) Interventions    Readmission Risk Interventions No flowsheet data found.

## 2021-11-08 NOTE — Progress Notes (Signed)
PT Cancellation Note  Patient Details Name: Tasha Moss MRN: [id number] DOB: [birth year]   Cancelled Treatment:    Reason Eval/Treat Not Completed: Medical issues which prohibited therapy (Awaiting CT.  Per ortho note: "CT scan of the left hip to rule out fracture and make sure hip is concentrically seated.")   Myrtis Hopping [place] 11/08/2021, 1:58 PM Jannette Spanner PT, DPT Acute Rehabilitation Services Pager: [phone] Office: [phone]

## 2021-11-09 LAB — CBC
HCT: 31.4 % — ABNORMAL LOW (ref 36.0–46.0)
Hemoglobin: 10.8 g/dL — ABNORMAL LOW (ref 12.0–15.0)
MCH: 31.1 pg (ref 26.0–34.0)
MCHC: 34.4 g/dL (ref 30.0–36.0)
MCV: 90.5 fL (ref 80.0–100.0)
Platelets: 213 10*3/uL (ref 150–400)
RBC: 3.47 MIL/uL — ABNORMAL LOW (ref 3.87–5.11)
RDW: 15.9 % — ABNORMAL HIGH (ref 11.5–15.5)
WBC: 9.8 10*3/uL (ref 4.0–10.5)
nRBC: 0 % (ref 0.0–0.2)

## 2021-11-09 LAB — BASIC METABOLIC PANEL
Anion gap: 9 (ref 5–15)
BUN: 13 mg/dL (ref 8–23)
CO2: 28 mmol/L (ref 22–32)
Calcium: 8 mg/dL — ABNORMAL LOW (ref 8.9–10.3)
Chloride: 100 mmol/L (ref 98–111)
Creatinine, Ser: 0.53 mg/dL (ref 0.44–1.00)
GFR, Estimated: >60 mL/min (ref >60)
Glucose, Bld: 99 mg/dL (ref 70–99)
Potassium: 2.9 mmol/L — ABNORMAL LOW (ref 3.5–5.1)
Sodium: 137 mmol/L (ref 135–145)

## 2021-11-09 MED ORDER — MECLIZINE HCL 25 MG PO TABS
12.5000 mg | ORAL_TABLET | Freq: Two times a day (BID) | ORAL | Status: DC | PRN
  Administered 2021-11-09 – 2021-11-16 (×4): 12.5 mg via ORAL
  Filled 2021-11-09 (×5): qty 1

## 2021-11-09 MED ORDER — POLYVINYL ALCOHOL 1.4 % OP SOLN
1.0000 [drp] | OPHTHALMIC | Status: DC | PRN
  Administered 2021-11-09: 1 [drp] via OPHTHALMIC
  Filled 2021-11-09: qty 15

## 2021-11-09 NOTE — Care Management Important Message (Signed)
Important Message  Patient Details IM Letter given to the Patient. Name: Tasha Moss MRN: [id number] Date of Birth: [birth year]   Medicare Important Message Given:  Yes     Kerin Salen 11/09/2021, 11:47 AM

## 2021-11-09 NOTE — Progress Notes (Signed)
Physical Therapy Treatment Patient Details Name: Tasha Moss MRN: [id number] DOB: 02-Nov-1936 Today's Date: 11/09/2021   History of Present Illness Pt is an 85 year old female admitted 11/06/21 with hx of L THA performed 1995 with closed hip dislocation 11/28 and currently admited due to limited mobility and poor pain control.  PMHx significant for reverse total shoulder arthroplasty 2017, arthritis, GERD, hypertension, hyperlipidemia, bilateral hip replacements, vertigo    PT Comments    Pt assisted with ambulating short distance and reports pain much improved today.  Reviewed posterior hip precautions and maintaining precautions during mobility.  Also observed small skin tear from friction of abduction brace on pt's back and called RN to view and placed mepilex.  Pt also with redness to gluteal folds and periarea (RN notified, barrier cream brought to room for pt).  Continue to recommend SNF, and pt and spouse feel SNF is still appropriate d/c plan at this time.    Recommendations for follow up therapy are one component of a multi-disciplinary discharge planning process, led by the attending physician.  Recommendations may be updated based on patient status, additional functional criteria and insurance authorization.  Follow Up Recommendations  Skilled nursing-short term rehab (<3 hours/day)     Assistance Recommended at Discharge Frequent or constant Supervision/Assistance  Equipment Recommendations  None recommended by PT    Recommendations for Other Services       Precautions / Restrictions Precautions Precautions: Fall;Posterior Hip Precaution Comments: reviewed and demonstrated posterior hip precautions Required Braces or Orthoses: Other Brace Other Brace: hip abduction brace Restrictions Other Position/Activity Restrictions: WBAT     Mobility  Bed Mobility Overal bed mobility: Needs Assistance       Supine to sit: Min assist     General bed mobility comments: pt  maintaining posterior hip precautions with cues, assist to scoot to EOB    Transfers Overall transfer level: Needs assistance Equipment used: Rolling walker (2 wheels) Transfers: Sit to/from Stand Sit to Stand: Min assist;+2 physical assistance;+2 safety/equipment;From elevated surface           General transfer comment: multimodal cues for technique and precautions; pt able to self assist more today then previous session, continues to require cues for positioning, precautions and technique    Ambulation/Gait Ambulation/Gait assistance: Min assist;+2 safety/equipment Gait Distance (Feet): 10 Feet Assistive device: Rolling walker (2 wheels) Gait Pattern/deviations: Step-to pattern;Trunk flexed       General Gait Details: verbal cues for sequence, RW positioning, posture; pt fatigued after 10 feet and recliner provided   Stairs             Wheelchair Mobility    Modified Rankin (Stroke Patients Only)       Balance                                            Cognition Arousal/Alertness: Awake/alert Behavior During Therapy: WFL for tasks assessed/performed Overall Cognitive Status: Within Functional Limits for tasks assessed                                          Exercises      General Comments        Pertinent Vitals/Pain Pain Assessment: Faces Faces Pain Scale: No hurt Pain Intervention(s): Repositioned;Monitored during session  Home Living                          Prior Function            PT Goals (current goals can now be found in the care plan section) Progress towards PT goals: Progressing toward goals    Frequency    Min 3X/week      PT Plan Current plan remains appropriate    Co-evaluation              AM-PAC PT "6 Clicks" Mobility   Outcome Measure  Help needed turning from your back to your side while in a flat bed without using bedrails?: A Lot Help needed moving  from lying on your back to sitting on the side of a flat bed without using bedrails?: A Lot Help needed moving to and from a bed to a chair (including a wheelchair)?: A Lot Help needed standing up from a chair using your arms (e.g., wheelchair or bedside chair)?: A Lot Help needed to walk in hospital room?: A Lot Help needed climbing 3-5 steps with a railing? : Total 6 Click Score: 11    End of Session Equipment Utilized During Treatment: Gait belt Activity Tolerance: Patient tolerated treatment well Patient left: in chair;with call bell/phone within reach;with chair alarm set;with family/visitor present Nurse Communication: Mobility status;Precautions PT Visit Diagnosis: Muscle weakness (generalized) (M62.81);Difficulty in walking, not elsewhere classified (R26.2)     Time: 1282-0813 PT Time Calculation (min) (ACUTE ONLY): 26 min  Charges:  $Gait Training: 8-22 mins $Therapeutic Activity: 8-22 mins                    Jannette Spanner PT, DPT Acute Rehabilitation Services Pager: [phone] Office: [place] 11/09/2021, 3:42 PM

## 2021-11-09 NOTE — Progress Notes (Signed)
     Subjective:  Patient lying comfortably in bed today. Reports she had a good day yesterday in regards to pain control. She had some leg spasms overnight but otherwise slept okay. She had an episode of vertigo as well when she went to the CT scanner but this seems better this morning. Denies distal n/t.  Did not work with PT yesterday because CT scan was unable to be done until later in the vening.  Objective:   VITALS:   Vitals:   11/08/21 0508 11/08/21 1350 11/08/21 2045 11/09/21 0521  BP: (!) 170/68 (!) 141/60 (!) 159/66 (!) 163/68  Pulse: 71 74 73 70  Resp: 16 16 16 16   Temp: 98 F (36.7 C) 98.3 F (36.8 C) 98.4 F (36.9 C) 98.3 F (36.8 C)  TempSrc: Oral  Oral Oral  SpO2: 96% 93% 93% 94%  Weight:      Height:        Left leg hip abduction brace in place. Tolerates gentle range of motion of the hip today including rotation and abduction  and flexion with minimal discomfort. Intact ankle dorsiflexion plantarflexion.  Lab Results  Component Value Date   WBC 6.9 11/07/2021   HGB 10.3 (L) 11/07/2021   HCT 30.4 (L) 11/07/2021   MCV 91.6 11/07/2021   PLT 187 11/07/2021   BMET    Component Value Date/Time   NA 138 11/08/2021 0258   NA 142 02/19/2016 0000   K 3.1 (L) 11/08/2021 0258   CL 103 11/08/2021 0258   CO2 28 11/08/2021 0258   GLUCOSE 104 (H) 11/08/2021 0258   BUN 16 11/08/2021 0258   BUN 13 02/19/2016 0000   CREATININE 0.60 11/08/2021 0258   CREATININE 0.56 (L) 11/10/2019 0931   CALCIUM 8.1 (L) 11/08/2021 0258   GFRNONAA >60 11/08/2021 0258     Assessment/Plan:  L THA performed 1995 with closed hip dislocation 11/28 now with limited mobility and poor pain control Multimodal pain control: Tylenol, Toradol, Flexeril, tramadol Weightbearing as tolerated posterior hip precautions - hip abduction brace applied 11/30 - Brace okay to be removed for hygiene, showering, and dressing changes CT scan 12/1 shows well seated components, no fx, concentric hip  reduction, no adverse features Continue PT/OT DVT Prophylaxis: lovenox Depending on progress with PT patient may benefit from discharge to skilled nursing facility.       Mayur Duman A Kianna Billet 11/09/2021, 7:12 AM   Charlies Constable, MD Cell [phone]  Contact information:   [email or web address] 7am-5pm epic message Dr. Zachery Dakins, or call office for patient follow up: [phone] After hours and holidays please check [email or web address] for group call information for [facility]

## 2021-11-09 NOTE — TOC Progression Note (Signed)
Transition of Care [facility]) - Progression Note   Patient Details  Name: Tasha Moss MRN: [id number] Date of Birth: 08/21/1936  Transition of Care [facility]) CM/SW [place], LCSW Phone Number: 11/09/2021, 3:27 PM  Clinical Narrative: CSW spoke with Mardene Celeste in admissions at [facility] as this is patient's only bed offer at this time. Per Mardene Celeste, the facility can accept the patient on Monday (11/12/21) when a bed will be available provided the family is aware and agreeable to short-term rehab only. Patient will need a COVID test and discharge summary on Monday.  CSW spoke with husband and daughter, Macil Crady, and family understands this is for rehab only as the family plans to bring her back home. Family has accepted the bed and [facility] and Mardene Celeste is agreeable to a Monday admission. TOC awaiting additional PT notes so that insurance authorization can be started with [employer].  Expected Discharge Plan: [place] Barriers to Discharge: Continued Medical Work up, Ship broker, SNF Pending bed offer  Expected Discharge Plan and Services Expected Discharge Plan: [place] In-house Referral: Clinical Social Work Post Acute Care Choice: [place] Living arrangements for the past 2 months: Single Family Home  Readmission Risk Interventions No flowsheet data found.

## 2021-11-09 NOTE — Plan of Care (Signed)
  Problem: Pain Managment: Goal: General experience of comfort will improve Outcome: Progressing   Problem: Safety: Goal: Ability to remain free from injury will improve Outcome: Progressing   

## 2021-11-10 LAB — CBC
HCT: 30.4 % — ABNORMAL LOW (ref 36.0–46.0)
Hemoglobin: 10.5 g/dL — ABNORMAL LOW (ref 12.0–15.0)
MCH: 31.3 pg (ref 26.0–34.0)
MCHC: 34.5 g/dL (ref 30.0–36.0)
MCV: 90.7 fL (ref 80.0–100.0)
Platelets: 222 10*3/uL (ref 150–400)
RBC: 3.35 MIL/uL — ABNORMAL LOW (ref 3.87–5.11)
RDW: 16 % — ABNORMAL HIGH (ref 11.5–15.5)
WBC: 7.3 10*3/uL (ref 4.0–10.5)
nRBC: 0 % (ref 0.0–0.2)

## 2021-11-10 LAB — BASIC METABOLIC PANEL
Anion gap: 7 (ref 5–15)
BUN: 9 mg/dL (ref 8–23)
CO2: 28 mmol/L (ref 22–32)
Calcium: 7.8 mg/dL — ABNORMAL LOW (ref 8.9–10.3)
Chloride: 103 mmol/L (ref 98–111)
Creatinine, Ser: 0.46 mg/dL (ref 0.44–1.00)
GFR, Estimated: >60 mL/min (ref >60)
Glucose, Bld: 86 mg/dL (ref 70–99)
Potassium: 3 mmol/L — ABNORMAL LOW (ref 3.5–5.1)
Sodium: 138 mmol/L (ref 135–145)

## 2021-11-10 LAB — C-REACTIVE PROTEIN: CRP: 2.8 mg/dL — ABNORMAL HIGH (ref <1.0)

## 2021-11-10 NOTE — TOC Progression Note (Signed)
Transition of Care [facility]) - Progression Note    Patient Details  Name: Tasha Moss MRN: [id number] Date of Birth: 07-Oct-1936  [facility]) CM/SW Contact  Ross Ludwig, [place] Phone Number: 11/10/2021, 5:58 PM  Clinical Narrative:     CSW attempted to start insurance authorization, however [place] [employer] website was down.  Will try again tomorrow, patient has a bed at [facility] once Josem Kaufmann has been received.   Expected Discharge Plan: [place] Barriers to Discharge: Continued Medical Work up, Ship broker, SNF Pending bed offer  Expected Discharge Plan and Services Expected Discharge Plan: [place] In-house Referral: Clinical Social Work   Post Acute Care Choice: [place] Living arrangements for the past 2 months: Single Family Home                                       Social Determinants of Health (SDOH) Interventions    Readmission Risk Interventions No flowsheet data found.

## 2021-11-10 NOTE — Plan of Care (Signed)
  Problem: Pain Managment: Goal: General experience of comfort will improve Outcome: Progressing   Problem: Safety: Goal: Ability to remain free from injury will improve Outcome: Progressing   Problem: Coping: Goal: Level of anxiety will decrease Outcome: Progressing   

## 2021-11-10 NOTE — Progress Notes (Addendum)
     Subjective:  Patient lying comfortably in bed today this morning. Reports having a much better day from a pain control standpoint yesterday, and better mobility with PT, and better pain control overnight. Remains afebrile with normal white count. Denies distal n/t. She does have some increased blanching redness on buttock and sacrum this morning, likely low grade pressure injury from being in bed last 2 days. Patient currently turned to offload pressure.  Objective:   VITALS:   Vitals:   11/08/21 2045 11/09/21 0521 11/09/21 2113 11/10/21 0513  BP: (!) 159/66 (!) 163/68 (!) 149/62 (!) 164/65  Pulse: 73 70 75 66  Resp: 16 16 15 16   Temp: 98.4 F (36.9 C) 98.3 F (36.8 C) 98.2 F (36.8 C) 97.9 F (36.6 C)  TempSrc: Oral Oral Oral Oral  SpO2: 93% 94% 93% 95%  Weight:      Height:        Left leg hip abduction brace in place. Tolerates gentle range of motion of the hip today including rotation and abduction  and flexion with minimal discomfort. Intact ankle dorsiflexion plantarflexion.  Lab Results  Component Value Date   WBC 7.3 11/10/2021   HGB 10.5 (L) 11/10/2021   HCT 30.4 (L) 11/10/2021   MCV 90.7 11/10/2021   PLT 222 11/10/2021   BMET    Component Value Date/Time   NA 138 11/10/2021 0349   NA 142 02/19/2016 0000   K 3.0 (L) 11/10/2021 0349   CL 103 11/10/2021 0349   CO2 28 11/10/2021 0349   GLUCOSE 86 11/10/2021 0349   BUN 9 11/10/2021 0349   BUN 13 02/19/2016 0000   CREATININE 0.46 11/10/2021 0349   CREATININE 0.56 (L) 11/10/2019 0931   CALCIUM 7.8 (L) 11/10/2021 0349   GFRNONAA >60 11/10/2021 0349     Assessment/Plan:  L THA performed 1995 with closed hip dislocation 11/28 now with limited mobility and poor pain control Multimodal pain control: Tylenol, Toradol, Flexeril, tramadol, hydrocodone for breakthrough Weightbearing as tolerated posterior hip precautions - hip abduction brace applied 11/30 - Brace okay to be removed for hygiene, showering,  and dressing changes CT scan 12/1 shows well seated components, no fx, concentric hip reduction, no adverse features Continue PT/OT DVT Prophylaxis: lovenox while in hospital, no DVT prophylaxis anticipated post discharged. Depending on progress with PT patient may benefit from discharge to skilled nursing facility - possibly Monday       Dalphine Cowie A [place] 11/10/2021, 7:00 AM   Charlies Constable, MD Cell [phone]  Contact information:   [email or web address] 7am-5pm epic message Dr. Zachery Dakins, or call office for patient follow up: [phone] After hours and holidays please check [email or web address] for group call information for [facility]

## 2021-11-10 NOTE — Discharge Instructions (Addendum)
Diet: As you were doing prior to hospitalization   Shower:  Okay to shower   Activity:  Increase activity slowly as tolerated, but follow the weight bearing instructions below.    Weight Bearing:   Weight bearing as tolerated left lower extremity with posterior hip precautions. No immobilization.  To prevent constipation: you may use a stool softener such as -  Colace (over the counter) 100 mg by mouth twice a day  Drink plenty of fluids (prune juice may be helpful) and high fiber foods Miralax (over the counter) for constipation as needed.    Itching:  If you experience itching with your medications, try taking only a single pain pill, or even half a pain pill at a time.  You may take up to 10 pain pills per day, and you can also use benadryl over the counter for itching or also to help with sleep.   Precautions:  If you experience chest pain or shortness of breath - call 911 immediately for transfer to the hospital emergency department!!   Call office [phone]) for the following: Temperature greater than 101F Persistent nausea and vomiting Severe uncontrolled pain Redness, tenderness, or signs of infection (pain, swelling, redness, odor or green/yellow discharge around the site) Difficulty breathing, headache or visual disturbances Hives Persistent dizziness or light-headedness Extreme fatigue Any other questions or concerns you may have after discharge  In an emergency, call 911 or go to an Emergency Department at a nearby hospital                                               Follow- Up Appointment:  Please call for an appointment to be seen in 2 weeks after discharge [facility] with your surgeon Dr. Charlies Constable - [phone]

## 2021-11-11 LAB — CBC
HCT: 29.1 % — ABNORMAL LOW (ref 36.0–46.0)
Hemoglobin: 10.3 g/dL — ABNORMAL LOW (ref 12.0–15.0)
MCH: 31.5 pg (ref 26.0–34.0)
MCHC: 35.4 g/dL (ref 30.0–36.0)
MCV: 89 fL (ref 80.0–100.0)
Platelets: 203 10*3/uL (ref 150–400)
RBC: 3.27 MIL/uL — ABNORMAL LOW (ref 3.87–5.11)
RDW: 15.9 % — ABNORMAL HIGH (ref 11.5–15.5)
WBC: 6.3 10*3/uL (ref 4.0–10.5)
nRBC: 0 % (ref 0.0–0.2)

## 2021-11-11 LAB — BASIC METABOLIC PANEL
Anion gap: 5 (ref 5–15)
BUN: 9 mg/dL (ref 8–23)
CO2: 29 mmol/L (ref 22–32)
Calcium: 7.6 mg/dL — ABNORMAL LOW (ref 8.9–10.3)
Chloride: 103 mmol/L (ref 98–111)
Creatinine, Ser: 0.37 mg/dL — ABNORMAL LOW (ref 0.44–1.00)
GFR, Estimated: >60 mL/min (ref >60)
Glucose, Bld: 98 mg/dL (ref 70–99)
Potassium: 2.7 mmol/L — CL (ref 3.5–5.1)
Sodium: 137 mmol/L (ref 135–145)

## 2021-11-11 LAB — RESP PANEL BY RT-PCR (FLU A&B, COVID) ARPGX2
Influenza A by PCR: NEGATIVE
Influenza B by PCR: NEGATIVE
SARS Coronavirus 2 by RT PCR: POSITIVE — AB

## 2021-11-11 MED ORDER — POTASSIUM CHLORIDE CRYS ER 20 MEQ PO TBCR
30.0000 meq | EXTENDED_RELEASE_TABLET | Freq: Two times a day (BID) | ORAL | Status: AC
  Administered 2021-11-11 (×2): 30 meq via ORAL
  Filled 2021-11-11 (×2): qty 1

## 2021-11-11 NOTE — Progress Notes (Signed)
Patient very uncomfortable overnight, repositioned multiple times, foam dressings placed to help prevent abductor brace from rubbing, pillow support used to decrease pressure on sacrum/buttocks, buttocks pink to red in color but blanchable, barrier cream also applied and sacral foam in place, norco given x2 and tramadol once to help though patient was reluctant to take them, patient stood at the bedside with the walker as we attempted to reposition the brace for comfort, will continue to monitor.

## 2021-11-11 NOTE — Progress Notes (Signed)
Date and time results received: 11/11/21 at 0434  Test: potassium Critical Value: 2.7  Name of Provider Notified: cal placed to on-call provider Carlyon Shadow, [place])  Orders Received? Or Actions Taken?: will await and implement any follow up orders

## 2021-11-11 NOTE — Progress Notes (Signed)
Physical Therapy Treatment Patient Details Name: Tasha Moss MRN: [id number] DOB: [birth year] Today's Date: 11/11/2021   History of Present Illness Pt is an 85 year old female admitted 11/06/21 with hx of L THA performed 1995 with closed hip dislocation 11/28 and currently admited due to limited mobility and poor pain control.  PMHx significant for reverse total shoulder arthroplasty 2017, arthritis, GERD, hypertension, hyperlipidemia, bilateral hip replacements, vertigo    PT Comments    Pt very cooperative but progressing slowly and limited by fatigue and nausea with movement.  Pt up from bedside to stand pvt to [facility] and then up to ambulate limited distance in room.  Chair pulled behind with pt c/o "I am getting so weak".  Pt would benefit from follow up rehab at SNF level to maximize IND and safety prior to return home with limited assist.  Recommendations for follow up therapy are one component of a multi-disciplinary discharge planning process, led by the attending physician.  Recommendations may be updated based on patient status, additional functional criteria and insurance authorization.  Follow Up Recommendations  Skilled nursing-short term rehab (<3 hours/day)     Assistance Recommended at Discharge Frequent or constant Supervision/Assistance  Equipment Recommendations  None recommended by PT    Recommendations for Other Services       Precautions / Restrictions Precautions Precautions: Fall;Posterior Hip Precaution Comments: Pt recalls 2/3 THP - all precautions reviewed Required Braces or Orthoses: Other Brace Other Brace: Order to DC hip abduction brace this am Restrictions Weight Bearing Restrictions: No LLE Weight Bearing: Weight bearing as tolerated     Mobility  Bed Mobility Overal bed mobility: Needs Assistance Bed Mobility: Supine to Sit     Supine to sit: Min assist     General bed mobility comments: pt maintaining posterior hip precautions with cues,  assist to scoot to EOB    Transfers Overall transfer level: Needs assistance Equipment used: Rolling walker (2 wheels) Transfers: Sit to/from Stand;Bed to chair/wheelchair/BSC Sit to Stand: Min assist;Mod assist;From elevated surface Stand pivot transfers: Mod assist;From elevated surface;+2 safety/equipment         General transfer comment: cues for sequence, posterior, position from RW and THP    Ambulation/Gait Ambulation/Gait assistance: Min assist;+2 safety/equipment Gait Distance (Feet): 6 Feet Assistive device: Rolling walker (2 wheels) Gait Pattern/deviations: Step-to pattern;Trunk flexed Gait velocity: decr     General Gait Details: verbal cues for sequence, RW positioning, posture; pt fatigued after 6 feet and recliner provided   Stairs             Wheelchair Mobility    Modified Rankin (Stroke Patients Only)       Balance Overall balance assessment: Needs assistance Sitting-balance support: No upper extremity supported;Feet supported Sitting balance-Leahy Scale: Fair     Standing balance support: Bilateral upper extremity supported;Reliant on assistive device for balance Standing balance-Leahy Scale: Poor                              Cognition Arousal/Alertness: Awake/alert Behavior During Therapy: WFL for tasks assessed/performed Overall Cognitive Status: Within Functional Limits for tasks assessed                                          Exercises      General Comments        Pertinent Vitals/Pain  Pain Assessment: 0-10 Pain Score: 3  Pain Location: left hip Pain Descriptors / Indicators: Aching;Sore Pain Intervention(s): Limited activity within patient's tolerance;Monitored during session;Premedicated before session    Home Living                          Prior Function            PT Goals (current goals can now be found in the care plan section) Acute Rehab PT Goals PT Goal  Formulation: With patient Time For Goal Achievement: 11/14/21 Potential to Achieve Goals: Good Progress towards PT goals: Progressing toward goals    Frequency    Min 3X/week      PT Plan Current plan remains appropriate    Co-evaluation              AM-PAC PT "6 Clicks" Mobility   Outcome Measure  Help needed turning from your back to your side while in a flat bed without using bedrails?: A Lot Help needed moving from lying on your back to sitting on the side of a flat bed without using bedrails?: A Little Help needed moving to and from a bed to a chair (including a wheelchair)?: A Lot Help needed standing up from a chair using your arms (e.g., wheelchair or bedside chair)?: A Lot Help needed to walk in hospital room?: A Lot Help needed climbing 3-5 steps with a railing? : Total 6 Click Score: 12    End of Session Equipment Utilized During Treatment: Gait belt Activity Tolerance: Patient tolerated treatment well Patient left: in chair;with call bell/phone within reach;with chair alarm set;with nursing/sitter in room Nurse Communication: Mobility status;Precautions PT Visit Diagnosis: Muscle weakness (generalized) (M62.81);Difficulty in walking, not elsewhere classified (R26.2) Pain - Right/Left: Left Pain - part of body: Hip     Time: 1610-9604 PT Time Calculation (min) (ACUTE ONLY): 27 min  Charges:  $Gait Training: 8-22 mins $Therapeutic Activity: 8-22 mins                     Debe Coder PT Acute Rehabilitation Services Pager [phone] Office [phone]    Aloysuis Ribaudo 11/11/2021, 12:55 PM

## 2021-11-11 NOTE — TOC Progression Note (Signed)
Transition of Care [facility]) - Progression Note    Patient Details  Name: Tasha Moss MRN: [id number] Date of Birth: 16-Mar-1936  Transition of [facility]) CM/SW Contact  Cecil Cobbs Phone Number: 11/11/2021, 2:19 PM  Clinical Narrative:     CSW submitted insurance authorization for [facility].  Patient has been approved by Public relations account executive number [phone].  CSW to continue to follow patient's progress throughout discharge planning.   Expected Discharge Plan: [place] Barriers to Discharge: Continued Medical Work up, Ship broker, SNF Pending bed offer  Expected Discharge Plan and Services Expected Discharge Plan: [place] In-house Referral: Clinical Social Work   Post Acute Care Choice: [place] Living arrangements for the past 2 months: Single Family Home                                       Social Determinants of Health (SDOH) Interventions    Readmission Risk Interventions No flowsheet data found.

## 2021-11-11 NOTE — Progress Notes (Signed)
     Subjective:  Patient having increasingly hard time with brace. Not having much pain in the left hip at this point, but having a lot of irritation from the brace rubbing on her abdomen. It is also making it difficult for her to be able to mobilize.  Objective:   VITALS:   Vitals:   11/10/21 0513 11/10/21 1446 11/10/21 2101 11/11/21 0359  BP: (!) 164/65 (!) 139/56 (!) 162/69 (!) 151/54  Pulse: 66 74 75 72  Resp: 16 16 16 16   Temp: 97.9 F (36.6 C) 98.3 F (36.8 C) 98.5 F (36.9 C) 98 F (36.7 C)  TempSrc: Oral  Oral Oral  SpO2: 95% 94% 94% 94%  Weight:      Height:        Left leg hip abduction brace in place. Tolerates gentle range of motion of the hip today including rotation and abduction  and flexion with minimal discomfort. Intact ankle dorsiflexion plantarflexion. Blanchable redness to posterior aspect of bilateral buttocks.  Lab Results  Component Value Date   WBC 6.3 11/11/2021   HGB 10.3 (L) 11/11/2021   HCT 29.1 (L) 11/11/2021   MCV 89.0 11/11/2021   PLT 203 11/11/2021   BMET    Component Value Date/Time   NA 137 11/11/2021 0325   NA 142 02/19/2016 0000   K 2.7 (LL) 11/11/2021 0325   CL 103 11/11/2021 0325   CO2 29 11/11/2021 0325   GLUCOSE 98 11/11/2021 0325   BUN 9 11/11/2021 0325   BUN 13 02/19/2016 0000   CREATININE 0.37 (L) 11/11/2021 0325   CREATININE 0.56 (L) 11/10/2019 0931   CALCIUM 7.6 (L) 11/11/2021 0325   GFRNONAA >60 11/11/2021 0325     Assessment/Plan:  L THA performed 1995 with closed hip dislocation 11/28 now with limited mobility and poor pain control Multimodal pain control: Tylenol, Toradol, Flexeril, tramadol, hydrocodone for breakthrough Weightbearing as tolerated posterior hip precautions - hip abduction brace applied 11/30 - Patient having sores/pain and irritation from hip abduction brace. Discussed risks/benefits of continued brace use and feel at this point risks of pressure sores and pain outweigh the benefits.  Recommend instead just use of a pillow between legs when in bed and continued posterior hip precautions. CT scan 12/1 shows well seated components, no fx, concentric hip reduction, no adverse features Pressure sores posterior sacrum and b/l buttocks - frequent turns, oob, and position changes during the day. Mepilex and barrier cream applied by nursing. CRP downtrending, WBC normal, afebrile - reassuring against infectious process Continue PT/OT DVT Prophylaxis: lovenox while in hospital, no DVT prophylaxis anticipated post discharged. Depending on progress with PT patient may benefit from discharge to skilled nursing facility - possibly Monday       Aliyanah Rozas A [place] 11/11/2021, 6:48 AM   Charlies Constable, MD Cell [phone]  Contact information:   [id number] 7am-5pm epic message Dr. Zachery Dakins, or call office for patient follow up: [phone] After hours and holidays please check [email or web address] for group call information for [facility]

## 2021-11-11 NOTE — Plan of Care (Signed)
  Problem: Coping: Goal: Level of anxiety will decrease Outcome: Progressing   Problem: Pain Managment: Goal: General experience of comfort will improve Outcome: Progressing   

## 2021-11-11 NOTE — Progress Notes (Signed)
Date and time results received: 11/11/21 at 2002  Test: Covid PCR swab Critical Value: Covid positive  Name of Provider Notified: Message sent through the answering service to Dr. Zachery Dakins  Orders Received? Or Actions Taken?: will await and implement any new orders.

## 2021-11-12 ENCOUNTER — Telehealth: Payer: Self-pay

## 2021-11-12 LAB — BASIC METABOLIC PANEL
Anion gap: 4 — ABNORMAL LOW (ref 5–15)
BUN: 8 mg/dL (ref 8–23)
CO2: 30 mmol/L (ref 22–32)
Calcium: 7.6 mg/dL — ABNORMAL LOW (ref 8.9–10.3)
Chloride: 102 mmol/L (ref 98–111)
Creatinine, Ser: 0.57 mg/dL (ref 0.44–1.00)
GFR, Estimated: >60 mL/min (ref >60)
Glucose, Bld: 98 mg/dL (ref 70–99)
Potassium: 3.6 mmol/L (ref 3.5–5.1)
Sodium: 136 mmol/L (ref 135–145)

## 2021-11-12 NOTE — Progress Notes (Signed)
     Subjective:  Patient doing much better without brace today. Minimal soreness in the L hip. Lying in bed with pillow between legs. Unfortunately Covid test yesterday in anticipation of discharge to SNF is positive. Patient asymptomatic. No NP or SOB. No other issues,.  Objective:   VITALS:   Vitals:   11/11/21 0359 11/11/21 1439 11/11/21 2134 11/12/21 0508  BP: (!) 151/54 (!) 148/67 (!) 161/79 (!) 140/58  Pulse: 72 76 82 81  Resp: 16 18 18 16   Temp: 98 F (36.7 C) 98.2 F (36.8 C) 98.5 F (36.9 C) 98.2 F (36.8 C)  TempSrc: Oral Oral Oral Oral  SpO2: 94% 96% 94% 90%  Weight:      Height:        Left leg hip abduction brace in place. Tolerates gentle range of motion of the hip today including rotation and abduction  and flexion with minimal discomfort. Intact ankle dorsiflexion plantarflexion. Blanchable redness to posterior aspect of bilateral buttocks.  Lab Results  Component Value Date   WBC 6.3 11/11/2021   HGB 10.3 (L) 11/11/2021   HCT 29.1 (L) 11/11/2021   MCV 89.0 11/11/2021   PLT 203 11/11/2021   BMET    Component Value Date/Time   NA 136 11/12/2021 0333   NA 142 02/19/2016 0000   K 3.6 11/12/2021 0333   CL 102 11/12/2021 0333   CO2 30 11/12/2021 0333   GLUCOSE 98 11/12/2021 0333   BUN 8 11/12/2021 0333   BUN 13 02/19/2016 0000   CREATININE 0.57 11/12/2021 0333   CREATININE 0.56 (L) 11/10/2019 0931   CALCIUM 7.6 (L) 11/12/2021 0333   GFRNONAA >60 11/12/2021 3300     Assessment/Plan:  L THA performed 1995 with closed hip dislocation 11/28 now with limited mobility and poor pain control Multimodal pain control: Tylenol, Toradol, Flexeril, tramadol, hydrocodone for breakthrough Weightbearing as tolerated posterior hip precautions - hip abduction brace applied 11/30 - Patient having sores/pain and irritation from hip abduction brace. Discussed risks/benefits of continued brace use and feel at this point risks of pressure sores and pain outweigh the  benefits. Recommend instead just use of a pillow between legs when in bed and continued posterior hip precautions. CT scan 12/1 shows well seated components, no fx, concentric hip reduction, no adverse features Pressure sores posterior sacrum and b/l buttocks - frequent turns, oob, and position changes during the day. Mepilex and barrier cream applied by nursing. CRP downtrending, WBC normal, afebrile - reassuring against infectious process COVID+ (12/4) : asymptomatic Continue PT/OT DVT Prophylaxis: lovenox while in hospital, no DVT prophylaxis anticipated post discharged. Depending on progress with PT patient may benefit from discharge to skilled nursing facility - possibly Monday       Tasha Moss A [place] 11/12/2021, 7:24 AM   Charlies Constable, MD  Contact information:   [phone] 7am-5pm epic message Dr. Zachery Dakins, or call office for patient follow up: [phone] After hours and holidays please check [email or web address] for group call information for [facility]

## 2021-11-12 NOTE — Telephone Encounter (Signed)
Noted.  I hope she is feeling better soon.

## 2021-11-12 NOTE — TOC Progression Note (Signed)
Transition of Care [facility]) - Progression Note    Patient Details  Name: ELSYE MCCOLLISTER MRN: [id number] Date of Birth: [birth year]  [facility]) CM/SW Contact  Lennart Pall, LCSW Phone Number: 11/12/2021, 1:06 PM  Clinical Narrative:    Have updated [facility] SNF that pt's COVID test is positive and they have rescinded the bed offer.  They report they can reconsider once pt is day # 11 post positive test.  Will reach out to other facilities to determine if any can consider admitting COVID + patient and will keep team posted.  Have updated pt's daughter, [name], who will pass information on to family.   Expected Discharge Plan: [place] Barriers to Discharge: Continued Medical Work up, Ship broker, SNF Pending bed offer  Expected Discharge Plan and Services Expected Discharge Plan: [place] In-house Referral: Clinical Social Work   Post Acute Care Choice: [place] Living arrangements for the past 2 months: Single Family Home                                       Social Determinants of Health (SDOH) Interventions    Readmission Risk Interventions No flowsheet data found.

## 2021-11-12 NOTE — Plan of Care (Signed)
  Problem: Coping: Goal: Level of anxiety will decrease Outcome: Progressing   Problem: Pain Managment: Goal: General experience of comfort will improve Outcome: Progressing   

## 2021-11-12 NOTE — Telephone Encounter (Signed)
Caller name:Leaha Lovena Le   On DPR? :Yes  Call back number:[phone]   Provider they see: Birdie Riddle  Reason for call:FYI patient husband called to inform pt is in the hospital from [place] [facility] and she has tested positive for Covid she was supposed to move to rehab today and they are holding off.

## 2021-11-13 NOTE — Progress Notes (Signed)
     Subjective:  Patient's pain well controlled. Disappointed with progress with PT at this point. Denies CP or SOB. No cough. Awaiting discharge to rehab but unfortunately delayed at this time due to positive COVID test.  Objective:   VITALS:   Vitals:   11/12/21 1329 11/12/21 2221 11/13/21 0649 11/13/21 1340  BP: (!) 156/81 (!) 150/62 (!) 150/51 (!) 132/56  Pulse: 77 74 66 77  Resp: 17 17 16 17   Temp: 99.1 F (37.3 C) 99.4 F (37.4 C) 98 F (36.7 C) 98.3 F (36.8 C)  TempSrc:  Oral Oral Oral  SpO2: 92% (!) 87% 100% 92%  Weight:      Height:       Tolerates gentle range of motion of the hip today including rotation and abduction  and flexion with minimal discomfort. Intact ankle dorsiflexion plantarflexion. Blanchable redness to posterior aspect of bilateral buttocks.  Lab Results  Component Value Date   WBC 6.3 11/11/2021   HGB 10.3 (L) 11/11/2021   HCT 29.1 (L) 11/11/2021   MCV 89.0 11/11/2021   PLT 203 11/11/2021   BMET    Component Value Date/Time   NA 136 11/12/2021 0333   NA 142 02/19/2016 0000   K 3.6 11/12/2021 0333   CL 102 11/12/2021 0333   CO2 30 11/12/2021 0333   GLUCOSE 98 11/12/2021 0333   BUN 8 11/12/2021 0333   BUN 13 02/19/2016 0000   CREATININE 0.57 11/12/2021 0333   CREATININE 0.56 (L) 11/10/2019 0931   CALCIUM 7.6 (L) 11/12/2021 0333   GFRNONAA >60 11/12/2021 1517     Assessment/Plan:  L THA performed 1995 with closed hip dislocation 11/28 now with limited mobility and poor pain control Multimodal pain control: Tylenol, Toradol, Flexeril, tramadol, hydrocodone for breakthrough Weightbearing as tolerated posterior hip precautions - hip abduction brace applied 11/30 - Patient having sores/pain and irritation from hip abduction brace. Discussed risks/benefits of continued brace use and feel at this point risks of pressure sores and pain outweigh the benefits. Recommend instead just use of a pillow between legs when in bed and continued  posterior hip precautions. CT scan 12/1 shows well seated components, no fx, concentric hip reduction, no adverse features Pressure sores posterior sacrum and b/l buttocks - frequent turns, oob, and position changes during the day. Mepilex and barrier cream applied by nursing. CRP downtrending, WBC normal, afebrile - reassuring against infectious process COVID+ (12/4) : asymptomatic Continue PT/OT DVT Prophylaxis: lovenox while in hospital, no DVT prophylaxis anticipated post discharged. Depending on progress with PT patient may benefit from discharge to skilled nursing facility - possibly Monday       Torey Reinard A [place] 11/13/2021, 5:28 PM   Charlies Constable, MD  Contact information:   [phone] 7am-5pm epic message Dr. Zachery Dakins, or call office for patient follow up: [phone] After hours and holidays please check [email or web address] for group call information for [facility]

## 2021-11-13 NOTE — Progress Notes (Signed)
Physical Therapy Treatment Patient Details Name: Tasha Moss MRN: [id number] DOB: 1936/11/03 Today's Date: 11/13/2021   History of Present Illness Pt is an 85 year old female admitted 11/06/21 with hx of L THA performed 1995 with closed hip dislocation 11/28 and currently admited due to limited mobility and poor pain control. tested Covid positive 12/5.  PMHx significant for reverse total shoulder arthroplasty 2017, arthritis, GERD, hypertension, hyperlipidemia, bilateral hip replacements, vertigo    PT Comments    Pt progressing with PT, dyspneic with exertion--+2 mod assist for amb very short distance, VSS. Continue to recommend SNF. Pt is pleasant and motivated to work with PT.   Recommendations for follow up therapy are one component of a multi-disciplinary discharge planning process, led by the attending physician.  Recommendations may be updated based on patient status, additional functional criteria and insurance authorization.  Follow Up Recommendations  Skilled nursing-short term rehab (<3 hours/day)     Assistance Recommended at Discharge Frequent or constant Supervision/Assistance  Equipment Recommendations  None recommended by PT    Recommendations for Other Services       Precautions / Restrictions Precautions Precautions: Fall;Posterior Hip Precaution Comments: Pt recalls 1/3 THP - all precautions reviewed Other Brace: Order to DC hip abduction brace, only place pillow between kness Restrictions Weight Bearing Restrictions: No Other Position/Activity Restrictions: WBAT     Mobility  Bed Mobility               General bed mobility comments: in chair    Transfers   Equipment used: Rolling walker (2 wheels) Transfers: Sit to/from Stand Sit to Stand: Mod assist;+2 physical assistance;+2 safety/equipment           General transfer comment: cues for sequence, THP/ LLE position, hand placement. assist with anterior -superior wt shift with significant  posterior bias in standing. repeated sit<>stand trials x3    Ambulation/Gait Ambulation/Gait assistance: Mod assist;+2 physical assistance;+2 safety/equipment Gait Distance (Feet): 4 Feet Assistive device: Rolling walker (2 wheels) Gait Pattern/deviations: Step-to pattern;Trunk flexed Gait velocity: decr     General Gait Details: verbal cues for sequence, RW positioning, posture; pt fatigued after 4' forward and back to recliner. 3/4 DOE, SpO2= 93% on RA   Stairs             Wheelchair Mobility    Modified Rankin (Stroke Patients Only)       Balance   Sitting-balance support: No upper extremity supported;Feet supported Sitting balance-Leahy Scale: Fair     Standing balance support: Bilateral upper extremity supported;Reliant on assistive device for balance Standing balance-Leahy Scale: Poor Standing balance comment: poor to zero, improved static stand with repeated standing trials                            Cognition Arousal/Alertness: Awake/alert Behavior During Therapy: WFL for tasks assessed/performed Overall Cognitive Status: Within Functional Limits for tasks assessed                                          Exercises      General Comments        Pertinent Vitals/Pain Pain Assessment: Faces Faces Pain Scale: Hurts little more Pain Location: left hip Pain Descriptors / Indicators: Aching;Sore Pain Intervention(s): Limited activity within patient's tolerance;Monitored during session;Premedicated before session;Repositioned    Home Living  Prior Function            PT Goals (current goals can now be found in the care plan section) Acute Rehab PT Goals Patient Stated Goal: walk again PT Goal Formulation: With patient Time For Goal Achievement: 11/14/21 Potential to Achieve Goals: Good Progress towards PT goals: Progressing toward goals    Frequency    Min 3X/week      PT  Plan Current plan remains appropriate    Co-evaluation              AM-PAC PT "6 Clicks" Mobility   Outcome Measure  Help needed turning from your back to your side while in a flat bed without using bedrails?: A Lot Help needed moving from lying on your back to sitting on the side of a flat bed without using bedrails?: Total Help needed moving to and from a bed to a chair (including a wheelchair)?: Total Help needed standing up from a chair using your arms (e.g., wheelchair or bedside chair)?: Total Help needed to walk in hospital room?: Total Help needed climbing 3-5 steps with a railing? : Total 6 Click Score: 7    End of Session Equipment Utilized During Treatment: Gait belt Activity Tolerance: Patient tolerated treatment well Patient left: in chair;with call bell/phone within reach;with chair alarm set Nurse Communication: Mobility status;Precautions PT Visit Diagnosis: Muscle weakness (generalized) (M62.81);Difficulty in walking, not elsewhere classified (R26.2) Pain - Right/Left: Left Pain - part of body: Hip     Time: 1205-1231 PT Time Calculation (min) (ACUTE ONLY): 26 min  Charges:  $Gait Training: 8-22 mins $Therapeutic Activity: 8-22 mins                     Baxter Flattery, PT  Acute Rehab Dept ([place]) [phone] Pager [phone]  11/13/2021    [facility] 11/13/2021, 4:00 PM

## 2021-11-13 NOTE — Care Management Important Message (Signed)
Important Message  Patient Details IM Letter given to the Patient. Name: Tasha Moss MRN: [id number] Date of Birth: 04-06-1936   Medicare Important Message Given:  Yes     Kerin Salen 11/13/2021, 1:03 PM

## 2021-11-13 NOTE — Telephone Encounter (Signed)
Error

## 2021-11-14 ENCOUNTER — Encounter: Payer: Medicare Other | Admitting: Family Medicine

## 2021-11-14 DIAGNOSIS — U071 COVID-19: Secondary | ICD-10-CM

## 2021-11-14 LAB — BASIC METABOLIC PANEL
Anion gap: 8 (ref 5–15)
BUN: 8 mg/dL (ref 8–23)
CO2: 30 mmol/L (ref 22–32)
Calcium: 8.1 mg/dL — ABNORMAL LOW (ref 8.9–10.3)
Chloride: 98 mmol/L (ref 98–111)
Creatinine, Ser: 0.4 mg/dL — ABNORMAL LOW (ref 0.44–1.00)
GFR, Estimated: >60 mL/min (ref >60)
Glucose, Bld: 119 mg/dL — ABNORMAL HIGH (ref 70–99)
Potassium: 3.2 mmol/L — ABNORMAL LOW (ref 3.5–5.1)
Sodium: 136 mmol/L (ref 135–145)

## 2021-11-14 LAB — CBC
HCT: 31.2 % — ABNORMAL LOW (ref 36.0–46.0)
Hemoglobin: 10.9 g/dL — ABNORMAL LOW (ref 12.0–15.0)
MCH: 31.5 pg (ref 26.0–34.0)
MCHC: 34.9 g/dL (ref 30.0–36.0)
MCV: 90.2 fL (ref 80.0–100.0)
Platelets: 214 10*3/uL (ref 150–400)
RBC: 3.46 MIL/uL — ABNORMAL LOW (ref 3.87–5.11)
RDW: 16 % — ABNORMAL HIGH (ref 11.5–15.5)
WBC: 5.4 10*3/uL (ref 4.0–10.5)
nRBC: 0 % (ref 0.0–0.2)

## 2021-11-14 MED ORDER — ZINC SULFATE 220 (50 ZN) MG PO CAPS
220.0000 mg | ORAL_CAPSULE | Freq: Every day | ORAL | Status: DC
  Administered 2021-11-14 – 2021-11-21 (×8): 220 mg via ORAL
  Filled 2021-11-14 (×8): qty 1

## 2021-11-14 MED ORDER — NIRMATRELVIR/RITONAVIR (PAXLOVID)TABLET
3.0000 | ORAL_TABLET | Freq: Two times a day (BID) | ORAL | Status: AC
  Administered 2021-11-14 – 2021-11-18 (×10): 3 via ORAL
  Filled 2021-11-14: qty 30

## 2021-11-14 MED ORDER — GUAIFENESIN-DM 100-10 MG/5ML PO SYRP: 10.0000 mL | ORAL_SOLUTION | ORAL | Status: DC | PRN

## 2021-11-14 MED ORDER — ASCORBIC ACID 500 MG PO TABS
500.0000 mg | ORAL_TABLET | Freq: Every day | ORAL | Status: DC
  Administered 2021-11-14 – 2021-11-21 (×8): 500 mg via ORAL
  Filled 2021-11-14 (×8): qty 1

## 2021-11-14 NOTE — Progress Notes (Signed)
     Subjective:  Patient's pain well controlled.  She has already mobilized up to the chair today.  She was pleased with improved mobility this morning compared to yesterday.  She did endorse some night sweats and chills overnight as well as a mild cough this morning.  She denies any chest pain or shortness of breath.  Objective:   VITALS:   Vitals:   11/13/21 0649 11/13/21 1340 11/13/21 2118 11/14/21 0558  BP: (!) 150/51 (!) 132/56 (!) 153/80 (!) 150/56  Pulse: 66 77 91 78  Resp: 16 17 16 16   Temp: 98 F (36.7 C) 98.3 F (36.8 C) 99.1 F (37.3 C) 98.1 F (36.7 C)  TempSrc: Oral Oral Oral Oral  SpO2: 100% 92% 93% 91%  Weight:      Height:       Tolerates gentle range of motion of the hip today including rotation and abduction  and flexion with minimal discomfort. Intact ankle dorsiflexion plantarflexion. Blanchable redness to posterior aspect of bilateral buttocks.  Lab Results  Component Value Date   WBC 5.4 11/14/2021   HGB 10.9 (L) 11/14/2021   HCT 31.2 (L) 11/14/2021   MCV 90.2 11/14/2021   PLT 214 11/14/2021   BMET    Component Value Date/Time   NA 136 11/14/2021 0751   NA 142 02/19/2016 0000   K 3.2 (L) 11/14/2021 0751   CL 98 11/14/2021 0751   CO2 30 11/14/2021 0751   GLUCOSE 119 (H) 11/14/2021 0751   BUN 8 11/14/2021 0751   BUN 13 02/19/2016 0000   CREATININE 0.40 (L) 11/14/2021 0751   CREATININE 0.56 (L) 11/10/2019 0931   CALCIUM 8.1 (L) 11/14/2021 0751   GFRNONAA >60 11/14/2021 0751     Assessment/Plan:  L THA performed 1995 with closed hip dislocation 11/28 now with limited mobility and poor pain control Multimodal pain control: Tylenol, Toradol, Flexeril, tramadol, hydrocodone for breakthrough Weightbearing as tolerated posterior hip precautions - hip abduction brace applied 11/30 - Patient having sores/pain and irritation from hip abduction brace. Discussed risks/benefits of continued brace use and feel at this point risks of pressure sores and  pain outweigh the benefits. Recommend instead just use of a pillow between legs when in bed and continued posterior hip precautions. CT scan 12/1 shows well seated components, no fx, concentric hip reduction, no adverse features Pressure sores posterior sacrum and b/l buttocks - frequent turns, oob, and position changes during the day. Mepilex and barrier cream applied by nursing. CRP downtrending, WBC normal, afebrile - reassuring against infectious process COVID+ (12/4) : 12/7 patient endorsing some mild symptoms concerning that they may be related to COVID including cough and night sweats, will discuss with the hospitalist this morning regarding starting treatment for her COVID symptoms.  Recommendations are appreciated. Continue PT/OT DVT Prophylaxis: lovenox while in hospital, no DVT prophylaxis anticipated post discharged. Discharge: Plan to discharge to SNF given patient's poor mobility and limited support at home.  Discharge planning at this time is delayed given patient's positive COVID test.       Thurley Francesconi A Claira Jeter 11/14/2021, 10:03 AM   Charlies Constable, MD  Contact information:   [phone] 7am-5pm epic message Dr. Zachery Dakins, or call office for patient follow up: [phone] After hours and holidays please check [email or web address] for group call information for [facility]

## 2021-11-14 NOTE — Consult Note (Signed)
Medical Consultation  Tasha Moss:[id number] DOB: [birth year] DOA: 11/06/2021 PCP: [name], MD   Requesting physician: Dr. Zachery Dakins Date of consultation: 11/14/21 Reason for consultation: COVID recommendations  Impression/Recommendations COVID 19 infection     - during discharge screening for SNF; noted to be COVID positive.      - yesterday, started having chills, muscle aches, loss of taste, poor appetite, and mild cough     - spoke with dtr about her case     - she is satting 91-93% on RA w/ cough at this point; she says she feels rough, but feels like she is getting enough air     - we can start paxlovid; d/w pt and dtr, they are in agreement; orders placed; I have notified the attending  (Dr. Zachery Dakins) by phone at 1122hrs     - she is stable otherwise  TR[facility] will sign-off at this time. Please call us back if anything else is required. Thank you.   Chief Complaint: Hip pain  HPI:  Tasha Moss is a 85 y.o. female with medical history significant of hypothyroidism, GERD, HLD, HTN. Presenting with left hip pain. She was seen in the [facility] ED for hip pain and found to have a dislocated hip prosthesis. A closed reduction was performed and she was discharged to home. She was followed by orthopedics afterwards. She was still in significant pain and unable to walk. Her family was having difficulty taking care of her. So, she was admitted to the orthopedic service. She has responded well to pain control The plan was to get her to SNF for continued PT. During her discharge COVID screening, she was found to be positive for COVID. She reports that over the last day or so, she has had increased chills, muscle ache, and fatigue. Her sense of taste has decreased and she has had worsening PO intake. Yesterday she complained of cough and generally feeling poorly. Orthopedics consulted TR[facility] was COVID recommendations.   Review of Systems:  Denies CP, palpitations, dyspnea,  abdominal pain, vomiting, fevers. Reports intermittent nausea, poor appetite, lost of taste, chills, muscle aches. Remainder of ROS is negative for all not mentioned in HPI  Past Medical History:  Diagnosis Date   Allergy    seasonal & dust   Anxiety    Arthritis    DJD   Family history of adverse reaction to anesthesia    n/v    GERD (gastroesophageal reflux disease)    History of vertigo    Hyperlipidemia    Hypertension    Hypothyroidism    PONV (postoperative nausea and vomiting)    Thyroid disease    hypothyroidism post Hashimoto's & partial thyroidectomy   Past Surgical History:  Procedure Laterality Date   CATARACT EXTRACTION W/ INTRAOCULAR LENS  IMPLANT, BILATERAL     COLONOSCOPY     diverticulosis   JOINT REPLACEMENT  1995 & 1999   THR bilaterally    lumbar disc repair  1989   Dr Arrie Aran   SPINE SURGERY     cervical ; Dr Vertell Limber   THYROIDECTOMY, PARTIAL     benign nodule   TONSILLECTOMY AND ADENOIDECTOMY     TOTAL SHOULDER ARTHROPLASTY Left 02/27/2016   Procedure: LEFT REVERSE TOTAL SHOULDER ARTHROPLASTY;  Surgeon: Renette Butters, MD;  Location: [place];  Service: Orthopedics;  Laterality: Left;   vitreous detachment     Dr Claudean Kinds   Social History:  reports that she has never smoked. She  has never used smokeless tobacco. She reports that she does not drink alcohol and does not use drugs.  Allergies  Allergen Reactions   Gabapentin     hallucinations   Other     Strong pain medications cause n/v   Morphine     Nausea & vomiting   Family History  Problem Relation Age of Onset   Diabetes Mother    Hepatitis Mother        Hepatitis B,? etiology   Miscarriages / Stillbirths Mother    Stroke Mother 20   Heart disease Father        CHF   Cancer Paternal Aunt        GI cancers   Heart disease Paternal Aunt        CHF   Cancer Paternal Uncle        lung, GI   Heart disease Paternal Uncle        CHF    Prior to Admission medications    Medication Sig Start Date End Date Taking? Authorizing Provider  acetaminophen (TYLENOL) 500 MG tablet Take 500 mg by mouth every 6 (six) hours as needed.   Yes [provider]  Cholecalciferol (VITAMIN D3) 1000 UNITS CAPS Take 1,000 Units by mouth daily.   Yes [provider]  Cranberry 200 MG CAPS Take 1 capsule by mouth daily.   Yes [provider]  fenofibrate 160 MG tablet Take 1 tablet by mouth once daily Patient taking differently: Take 160 mg by mouth daily. 08/27/21  Yes [name], MD  FLUoxetine (PROZAC) 20 MG capsule Take 1 capsule by mouth once daily Patient taking differently: Take 20 mg by mouth daily. 10/15/21  Yes [name], MD  fluticasone (FLONASE) 50 MCG/ACT nasal spray Place 1 spray into both nostrils daily.   Yes [provider]  glucosamine-chondroitin 500-400 MG tablet Take 1 tablet by mouth every other day.   Yes [provider]  levothyroxine (SYNTHROID) 125 MCG tablet TAKE 1 TABLET BY MOUTH ONCE DAILY BEFORE BREAKFAST Patient taking differently: Take 125 mcg by mouth daily before breakfast. 10/11/21  Yes [name], MD  loratadine (CLARITIN) 10 MG tablet Take 10 mg by mouth daily.   Yes [provider]  meloxicam (MOBIC) 7.5 MG tablet Take 1 tablet by mouth twice daily Patient taking differently: Take 7.5 mg by mouth 2 (two) times daily. 09/21/21  Yes [name], MD  Multiple Vitamin (MULTIVITAMIN) tablet Take 1 tablet by mouth daily.   Yes [provider]  Naphazoline-Polyethyl Glycol (ALL CLEAR EYE DROPS OP) Apply 2 drops to eye daily.   Yes [provider]  pantoprazole (PROTONIX) 40 MG tablet Take 1 tablet by mouth once daily Patient taking differently: Take 40 mg by mouth daily. 09/05/21  Yes [name], MD  pravastatin (PRAVACHOL) 40 MG tablet TAKE 1 TABLET BY MOUTH ONCE DAILY AT BEDTIME FOR CHOLESTEROL Patient taking differently: Take 40 mg by mouth  daily. 10/01/21  Yes [name], MD  propranolol ER (INDERAL LA) 60 MG 24 hr capsule Take 1 capsule (60 mg total) by mouth daily. 08/02/21  Yes Dutch Quint B, FNP  vitamin B-12 (CYANOCOBALAMIN) 250 MCG tablet Take 250 mcg by mouth daily.   Yes [provider]  chlorhexidine (PERIDEX) 0.12 % solution Use as directed 15 mLs in the mouth or throat 2 (two) times daily.  Patient not taking: Reported on 11/06/2021 04/13/20   [provider]  Physical Exam: Blood pressure (!) 150/56, pulse 78, temperature 98.1 F (36.7 C), temperature source Oral, resp. rate 16, height 5\' 5"  (1.651 m), weight 64.9 kg, SpO2 91 %. Vitals:   11/13/21 2118 11/14/21 0558  BP: (!) 153/80 (!) 150/56  Pulse: 91 78  Resp: 16 16  Temp: 99.1 F (37.3 C) 98.1 F (36.7 C)  SpO2: 93% 91%    General: 85 y.o. female resting in chair in NAD Eyes: PERRL, normal sclera ENMT: Nares patent w/o discharge, orophaynx clear, dentition normal, ears w/o discharge/lesions/ulcers Neck: Supple, trachea midline Cardiovascular: RRR, +S1, S2, no m/g/r, equal pulses throughout Respiratory: soft scattered rhonchi noted, no w/r, normal WOB on RA, sats 91 - 93% GI: BS+, NDNT, no masses noted, no organomegaly noted MSK: No e/c/c Neuro: A&O x 3, other than HoH, no focal deficits on exam Psyc: Appropriate interaction and affect, calm/cooperative  Labs on Admission:  Basic Metabolic Panel: Recent Labs  Lab 11/09/21 0819 11/10/21 0349 11/11/21 0325 11/12/21 0333 11/14/21 0751  NA 137 138 137 136 136  K 2.9* 3.0* 2.7* 3.6 3.2*  CL 100 103 103 102 98  CO2 28 28 29 30 30   GLUCOSE 99 86 98 98 119*  BUN 13 9 9 8 8   CREATININE 0.53 0.46 0.37* 0.57 0.40*  CALCIUM 8.0* 7.8* 7.6* 7.6* 8.1*   Liver Function Tests: No results for input(s): AST, ALT, ALKPHOS, BILITOT, PROT, ALBUMIN in the last 168 hours. No results for input(s): LIPASE, AMYLASE in the last 168 hours. No results for input(s): AMMONIA in the last 168  hours. CBC: Recent Labs  Lab 11/09/21 0819 11/10/21 0349 11/11/21 0325 11/14/21 0751  WBC 9.8 7.3 6.3 5.4  HGB 10.8* 10.5* 10.3* 10.9*  HCT 31.4* 30.4* 29.1* 31.2*  MCV 90.5 90.7 89.0 90.2  PLT 213 222 203 214   Cardiac Enzymes: No results for input(s): CKTOTAL, CKMB, CKMBINDEX, TROPONINI in the last 168 hours. BNP: Invalid input(s): POCBNP CBG: No results for input(s): GLUCAP in the last 168 hours.  Radiological Exams on Admission: No results found.  EKG: None obtained by primary  Time spent: 35 minutes  [place] [facility]  If 7PM-7AM, please contact night-coverage [email or web address] 11/14/2021, 11:16 AM

## 2021-11-15 LAB — C-REACTIVE PROTEIN: CRP: 5 mg/dL — ABNORMAL HIGH (ref <1.0)

## 2021-11-15 LAB — D-DIMER, QUANTITATIVE: D-Dimer, Quant: <0.27 ug/mL-FEU (ref 0.00–0.50)

## 2021-11-15 LAB — COMPREHENSIVE METABOLIC PANEL
ALT: 24 U/L (ref 0–44)
AST: 20 U/L (ref 15–41)
Albumin: 2.6 g/dL — ABNORMAL LOW (ref 3.5–5.0)
Alkaline Phosphatase: 34 U/L — ABNORMAL LOW (ref 38–126)
Anion gap: 8 (ref 5–15)
BUN: 8 mg/dL (ref 8–23)
CO2: 30 mmol/L (ref 22–32)
Calcium: 8.1 mg/dL — ABNORMAL LOW (ref 8.9–10.3)
Chloride: 100 mmol/L (ref 98–111)
Creatinine, Ser: 0.48 mg/dL (ref 0.44–1.00)
GFR, Estimated: >60 mL/min (ref >60)
Glucose, Bld: 101 mg/dL — ABNORMAL HIGH (ref 70–99)
Potassium: 3.5 mmol/L (ref 3.5–5.1)
Sodium: 138 mmol/L (ref 135–145)
Total Bilirubin: 0.6 mg/dL (ref 0.3–1.2)
Total Protein: 5 g/dL — ABNORMAL LOW (ref 6.5–8.1)

## 2021-11-15 LAB — FERRITIN: Ferritin: 207 ng/mL (ref 11–307)

## 2021-11-15 NOTE — Plan of Care (Signed)

## 2021-11-15 NOTE — Progress Notes (Signed)
     Subjective:  Patient's pain well controlled this morning. She reports having a good night's rest. Minimal cough this morning. No SOB. Doing fine otherwise. Denies distal n/t.  Objective:   VITALS:   Vitals:   11/13/21 2118 11/14/21 0558 11/14/21 2116 11/15/21 0525  BP: (!) 153/80 (!) 150/56 132/60 (!) 141/64  Pulse: 91 78 72 68  Resp: 16 16 16 16   Temp: 99.1 F (37.3 C) 98.1 F (36.7 C) 98.2 F (36.8 C) 97.7 F (36.5 C)  TempSrc: Oral Oral Oral Oral  SpO2: 93% 91% 92% 93%  Weight:      Height:       Tolerates gentle range of motion of the hip including rotation and abduction  and flexion without discomfort. Intact ankle dorsiflexion plantarflexion. Minimal redness to posterior aspect of bilateral buttocks - improved  Lab Results  Component Value Date   WBC 5.4 11/14/2021   HGB 10.9 (L) 11/14/2021   HCT 31.2 (L) 11/14/2021   MCV 90.2 11/14/2021   PLT 214 11/14/2021   BMET    Component Value Date/Time   NA 138 11/15/2021 0326   NA 142 02/19/2016 0000   K 3.5 11/15/2021 0326   CL 100 11/15/2021 0326   CO2 30 11/15/2021 0326   GLUCOSE 101 (H) 11/15/2021 0326   BUN 8 11/15/2021 0326   BUN 13 02/19/2016 0000   CREATININE 0.48 11/15/2021 0326   CREATININE 0.56 (L) 11/10/2019 0931   CALCIUM 8.1 (L) 11/15/2021 0326   GFRNONAA >60 11/15/2021 0326     Assessment/Plan:  L THA performed 1995 with closed hip dislocation 11/28 now with limited mobility and poor pain control Multimodal pain control: Tylenol, Toradol, Flexeril, tramadol, hydrocodone for breakthrough Weightbearing as tolerated posterior hip precautions - hip abduction brace applied 11/30 - Patient having sores/pain and irritation from hip abduction brace. Discussed risks/benefits of continued brace use and feel at this point risks of pressure sores and pain outweigh the benefits. Recommend instead just use of a pillow between legs when in bed and continued posterior hip precautions. CT scan 12/1 shows  well seated components, no fx, concentric hip reduction, no adverse features Pressure sores posterior sacrum and b/l buttocks - frequent turns, oob, and position changes during the day. Mepilex and barrier cream applied by nursing. CRP downtrending, WBC normal, afebrile - reassuring against infectious process COVID+ (12/4) : 12/7 patient endorsing some mild symptoms concerning that they may be related to COVID including cough and night sweats, will discuss with the hospitalist this morning regarding starting treatment for her COVID symptoms.  Paxlovid started 12/7 Continue PT/OT DVT Prophylaxis: lovenox while in hospital, no DVT prophylaxis anticipated post discharged. Discharge: Plan to discharge to SNF given patient's poor mobility and limited support at home.  Discharge planning at this time is delayed given patient's positive COVID test.       Tasha Moss A Tasha Moss 11/15/2021, 7:01 AM   Tasha Constable, MD  Contact information:   [phone] 7am-5pm epic message Dr. Zachery Dakins, or call office for patient follow up: [phone] After hours and holidays please check [email or web address] for group call information for [facility]

## 2021-11-16 LAB — COMPREHENSIVE METABOLIC PANEL
ALT: 21 U/L (ref 0–44)
AST: 20 U/L (ref 15–41)
Albumin: 2.6 g/dL — ABNORMAL LOW (ref 3.5–5.0)
Alkaline Phosphatase: 36 U/L — ABNORMAL LOW (ref 38–126)
Anion gap: 8 (ref 5–15)
BUN: 7 mg/dL — ABNORMAL LOW (ref 8–23)
CO2: 30 mmol/L (ref 22–32)
Calcium: 8.3 mg/dL — ABNORMAL LOW (ref 8.9–10.3)
Chloride: 101 mmol/L (ref 98–111)
Creatinine, Ser: 0.58 mg/dL (ref 0.44–1.00)
GFR, Estimated: >60 mL/min (ref >60)
Glucose, Bld: 88 mg/dL (ref 70–99)
Potassium: 3.6 mmol/L (ref 3.5–5.1)
Sodium: 139 mmol/L (ref 135–145)
Total Bilirubin: 0.4 mg/dL (ref 0.3–1.2)
Total Protein: 5.2 g/dL — ABNORMAL LOW (ref 6.5–8.1)

## 2021-11-16 LAB — D-DIMER, QUANTITATIVE: D-Dimer, Quant: 0.36 ug/mL-FEU (ref 0.00–0.50)

## 2021-11-16 LAB — C-REACTIVE PROTEIN: CRP: 2.4 mg/dL — ABNORMAL HIGH (ref <1.0)

## 2021-11-16 LAB — FERRITIN: Ferritin: 235 ng/mL (ref 11–307)

## 2021-11-16 MED ORDER — MECLIZINE HCL 25 MG PO TABS
12.5000 mg | ORAL_TABLET | Freq: Three times a day (TID) | ORAL | Status: DC
  Administered 2021-11-16 – 2021-11-21 (×11): 12.5 mg via ORAL
  Filled 2021-11-16 (×13): qty 1

## 2021-11-16 NOTE — Progress Notes (Signed)
     Subjective:  Patient doing okay. Had bad episode of vertigo today. Responded to increased dose of meclizine.  No pain in the left hip. Feels weak and has had difficulty mobilizing with PT due to fatigue and weakness in both legs. Has a small cough. Denies SOB. Soonest discharge to rehab per case management would be 12/15 given the +COVID test.  Objective:   VITALS:   Vitals:   11/15/21 2120 11/16/21 0504 11/16/21 0855 11/16/21 1455  BP: 114/65 118/66 (!) 154/83 (!) 143/58  Pulse: 74 74 73 71  Resp: 16 17 18 18   Temp: 98.6 F (37 C) 98.2 F (36.8 C) 98.1 F (36.7 C) 98.1 F (36.7 C)  TempSrc:   Oral Oral  SpO2: 91% 90% 95% 98%  Weight:      Height:       Tolerates gentle range of motion of the hip including rotation and abduction  and flexion without discomfort. Intact ankle dorsiflexion plantarflexion. Minimal redness to posterior aspect of bilateral buttocks - improved  Lab Results  Component Value Date   WBC 5.4 11/14/2021   HGB 10.9 (L) 11/14/2021   HCT 31.2 (L) 11/14/2021   MCV 90.2 11/14/2021   PLT 214 11/14/2021   BMET    Component Value Date/Time   NA 139 11/16/2021 0341   NA 142 02/19/2016 0000   K 3.6 11/16/2021 0341   CL 101 11/16/2021 0341   CO2 30 11/16/2021 0341   GLUCOSE 88 11/16/2021 0341   BUN 7 (L) 11/16/2021 0341   BUN 13 02/19/2016 0000   CREATININE 0.58 11/16/2021 0341   CREATININE 0.56 (L) 11/10/2019 0931   CALCIUM 8.3 (L) 11/16/2021 0341   GFRNONAA >60 11/16/2021 0341     Assessment/Plan:  L THA performed 1995 with closed hip dislocation 11/28 now with limited mobility and poor pain control Multimodal pain control: Tylenol, Toradol, Flexeril, tramadol, hydrocodone for breakthrough Weightbearing as tolerated posterior hip precautions - hip abduction brace applied 11/30 - Patient having sores/pain and irritation from hip abduction brace. Discussed risks/benefits of continued brace use and feel at this point risks of pressure sores  and pain outweigh the benefits. Recommend instead just use of a pillow between legs when in bed and continued posterior hip precautions. CT scan 12/1 shows well seated components, no fx, concentric hip reduction, no adverse features Pressure sores posterior sacrum and b/l buttocks - frequent turns, oob, and position changes during the day. Mepilex and barrier cream applied by nursing. CRP downtrending, WBC normal, afebrile - reassuring against infectious process COVID+ (12/4) : 12/7 patient endorsing some mild symptoms concerning that they may be related to COVID including cough and night sweats, will discuss with the hospitalist this morning regarding starting treatment for her COVID symptoms.  Paxlovid started 12/7 Continue PT/OT DVT Prophylaxis: lovenox while in hospital, no DVT prophylaxis anticipated post discharged. Discharge: Plan to discharge to SNF given patient's poor mobility and limited support at home.  Discharge planning at this time is delayed given patient's positive COVID test.       Tasha Moss A Tasha Moss 11/16/2021, 6:36 PM   Charlies Constable, MD  Contact information:   [phone] 7am-5pm epic message Dr. Zachery Dakins, or call office for patient follow up: [phone] After hours and holidays please check [email or web address] for group call information for [facility]

## 2021-11-16 NOTE — Plan of Care (Signed)
  Problem: Pain Managment: Goal: General experience of comfort will improve 11/16/2021 0123 by Abagail Kitchens, RN Outcome: Progressing 11/16/2021 0123 by Abagail Kitchens, RN Outcome: Progressing   Problem: Safety: Goal: Ability to remain free from injury will improve Outcome: Progressing

## 2021-11-16 NOTE — Progress Notes (Signed)
Physical Therapy Treatment Patient Details Name: Tasha Moss NO MRN: [id number] DOB: 08/14/1936 Today's Date: 11/16/2021   History of Present Illness Pt is an 85 year old female admitted 11/06/21 with hx of L THA performed 1995 with closed hip dislocation 11/28 and currently admited due to limited mobility and poor pain control. tested Covid positive 12/5.  PMHx significant for reverse total shoulder arthroplasty 2017, arthritis, GERD, hypertension, hyperlipidemia, bilateral hip replacements, vertigo    PT Comments    Pt agreeable to LE therex but OOB deferred at pt request 2* increasing dizziness/nausea in supine - BP 153/86 - RN aware.  Recommendations for follow up therapy are one component of a multi-disciplinary discharge planning process, led by the attending physician.  Recommendations may be updated based on patient status, additional functional criteria and insurance authorization.  Follow Up Recommendations  Skilled nursing-short term rehab (<3 hours/day)     Assistance Recommended at Discharge Frequent or constant Supervision/Assistance  Equipment Recommendations  None recommended by PT    Recommendations for Other Services       Precautions / Restrictions Precautions Precautions: Fall;Posterior Hip Precaution Comments: Pt recalls 2/3 THP - all precautions reviewed Other Brace: Order to DC hip abduction brace, only place pillow between kness Restrictions Weight Bearing Restrictions: No LLE Weight Bearing: Weight bearing as tolerated     Mobility  Bed Mobility               General bed mobility comments: OOB deferred 2* pt c/o increasing nausea and dizziness    Transfers                        Ambulation/Gait                   Stairs             Wheelchair Mobility    Modified Rankin (Stroke Patients Only)       Balance                                            Cognition Arousal/Alertness:  Awake/alert Behavior During Therapy: WFL for tasks assessed/performed Overall Cognitive Status: Within Functional Limits for tasks assessed                                          Exercises Total Joint Exercises Ankle Circles/Pumps: AROM;Both;20 reps;Supine Quad Sets: AROM;Both;10 reps;Supine Heel Slides: AAROM;Left;20 reps;Supine Hip ABduction/ADduction: AAROM;Left;15 reps;Supine    General Comments        Pertinent Vitals/Pain Pain Assessment: Faces Faces Pain Scale: Hurts little more Pain Location: left hip Pain Descriptors / Indicators: Aching;Sore Pain Intervention(s): Limited activity within patient's tolerance;Monitored during session;Premedicated before session    Home Living                          Prior Function            PT Goals (current goals can now be found in the care plan section) Acute Rehab PT Goals Patient Stated Goal: walk again PT Goal Formulation: With patient Time For Goal Achievement: 11/28/21 Potential to Achieve Goals: Good Progress towards PT goals: Not progressing toward goals - comment (nausea/dizziness in supine)  Frequency    Min 3X/week      PT Plan Current plan remains appropriate    Co-evaluation              AM-PAC PT "6 Clicks" Mobility   Outcome Measure  Help needed turning from your back to your side while in a flat bed without using bedrails?: A Lot Help needed moving from lying on your back to sitting on the side of a flat bed without using bedrails?: Total Help needed moving to and from a bed to a chair (including a wheelchair)?: Total Help needed standing up from a chair using your arms (e.g., wheelchair or bedside chair)?: Total Help needed to walk in hospital room?: Total Help needed climbing 3-5 steps with a railing? : Total 6 Click Score: 7    End of Session   Activity Tolerance: Other (comment) (nausea/dizziness) Patient left: in bed;with call bell/phone within  reach;with bed alarm set Nurse Communication: Mobility status;Precautions PT Visit Diagnosis: Muscle weakness (generalized) (M62.81);Difficulty in walking, not elsewhere classified (R26.2) Pain - Right/Left: Left Pain - part of body: Hip     Time: 1135-1200 PT Time Calculation (min) (ACUTE ONLY): 25 min  Charges:  $Therapeutic Exercise: 8-22 mins                     Debe Coder PT Acute Rehabilitation Services Pager [phone] Office [phone]    Erickson Yamashiro 11/16/2021, 12:21 PM

## 2021-11-16 NOTE — Care Plan (Signed)
Received Page from RN that Patient reports Vertigo, Patient was seen and briefly examined.  She seems at her baseline, She is awaiting SNF placement pending Incidental COVID+ test, Advised increasing meclizine to TID, She is already receiving paxloid, advised to finish course. No further recommendation. TRH will sign off. Thanks

## 2021-11-16 NOTE — Care Management Important Message (Signed)
Important Message  Patient Details IM Letter given to the Patient. Name: Tasha Moss MRN: [id number] Date of Birth: February 18, 1936   Medicare Important Message Given:  Yes     Kerin Salen 11/16/2021, 1:42 PM

## 2021-11-17 DIAGNOSIS — R4182 Altered mental status, unspecified: Secondary | ICD-10-CM

## 2021-11-17 LAB — URINALYSIS, ROUTINE W REFLEX MICROSCOPIC
Bilirubin Urine: NEGATIVE
Glucose, UA: NEGATIVE mg/dL
Hgb urine dipstick: NEGATIVE
Ketones, ur: NEGATIVE mg/dL
Leukocytes,Ua: NEGATIVE
Nitrite: NEGATIVE
Protein, ur: NEGATIVE mg/dL
Specific Gravity, Urine: 1.005 (ref 1.005–1.030)
pH: 7 (ref 5.0–8.0)

## 2021-11-17 LAB — COMPREHENSIVE METABOLIC PANEL
ALT: 19 U/L (ref 0–44)
AST: 17 U/L (ref 15–41)
Albumin: 2.6 g/dL — ABNORMAL LOW (ref 3.5–5.0)
Alkaline Phosphatase: 37 U/L — ABNORMAL LOW (ref 38–126)
Anion gap: 10 (ref 5–15)
BUN: 6 mg/dL — ABNORMAL LOW (ref 8–23)
CO2: 29 mmol/L (ref 22–32)
Calcium: 8.6 mg/dL — ABNORMAL LOW (ref 8.9–10.3)
Chloride: 100 mmol/L (ref 98–111)
Creatinine, Ser: 0.52 mg/dL (ref 0.44–1.00)
GFR, Estimated: >60 mL/min (ref >60)
Glucose, Bld: 95 mg/dL (ref 70–99)
Potassium: 3.3 mmol/L — ABNORMAL LOW (ref 3.5–5.1)
Sodium: 139 mmol/L (ref 135–145)
Total Bilirubin: 0.7 mg/dL (ref 0.3–1.2)
Total Protein: 5.6 g/dL — ABNORMAL LOW (ref 6.5–8.1)

## 2021-11-17 LAB — CBC WITH DIFFERENTIAL/PLATELET
Abs Immature Granulocytes: 0.04 10*3/uL (ref 0.00–0.07)
Basophils Absolute: 0 10*3/uL (ref 0.0–0.1)
Basophils Relative: 0 %
Eosinophils Absolute: 0.1 10*3/uL (ref 0.0–0.5)
Eosinophils Relative: 2 %
HCT: 31.9 % — ABNORMAL LOW (ref 36.0–46.0)
Hemoglobin: 10.8 g/dL — ABNORMAL LOW (ref 12.0–15.0)
Immature Granulocytes: 1 %
Lymphocytes Relative: 15 %
Lymphs Abs: 1.2 10*3/uL (ref 0.7–4.0)
MCH: 30.9 pg (ref 26.0–34.0)
MCHC: 33.9 g/dL (ref 30.0–36.0)
MCV: 91.4 fL (ref 80.0–100.0)
Monocytes Absolute: 0.6 10*3/uL (ref 0.1–1.0)
Monocytes Relative: 7 %
Neutro Abs: 6 10*3/uL (ref 1.7–7.7)
Neutrophils Relative %: 75 %
Platelets: 289 10*3/uL (ref 150–400)
RBC: 3.49 MIL/uL — ABNORMAL LOW (ref 3.87–5.11)
RDW: 15.7 % — ABNORMAL HIGH (ref 11.5–15.5)
WBC: 7.9 10*3/uL (ref 4.0–10.5)
nRBC: 0 % (ref 0.0–0.2)

## 2021-11-17 LAB — FERRITIN: Ferritin: 282 ng/mL (ref 11–307)

## 2021-11-17 LAB — C-REACTIVE PROTEIN: CRP: 2.5 mg/dL — ABNORMAL HIGH (ref <1.0)

## 2021-11-17 LAB — GLUCOSE, CAPILLARY: Glucose-Capillary: 116 mg/dL — ABNORMAL HIGH (ref 70–99)

## 2021-11-17 LAB — D-DIMER, QUANTITATIVE: D-Dimer, Quant: 0.43 ug/mL-FEU (ref 0.00–0.50)

## 2021-11-17 MED ORDER — PANTOPRAZOLE 2 MG/ML SUSPENSION
40.0000 mg | Freq: Every day | ORAL | Status: DC
  Administered 2021-11-18 – 2021-11-21 (×4): 40 mg via ORAL
  Filled 2021-11-17 (×4): qty 20

## 2021-11-17 MED ORDER — SODIUM CHLORIDE 0.9 % IV BOLUS
500.0000 mL | Freq: Once | INTRAVENOUS | Status: AC
  Administered 2021-11-17: 500 mL via INTRAVENOUS

## 2021-11-17 MED ORDER — MELATONIN 3 MG PO TABS
3.0000 mg | ORAL_TABLET | Freq: Every day | ORAL | Status: DC
  Administered 2021-11-17 – 2021-11-20 (×4): 3 mg via ORAL
  Filled 2021-11-17 (×4): qty 1

## 2021-11-17 MED ORDER — BOOST / RESOURCE BREEZE PO LIQD CUSTOM
1.0000 | Freq: Three times a day (TID) | ORAL | Status: DC
  Administered 2021-11-17 – 2021-11-21 (×12): 1 via ORAL

## 2021-11-17 MED ORDER — PROPRANOLOL HCL 20 MG PO TABS
20.0000 mg | ORAL_TABLET | Freq: Three times a day (TID) | ORAL | Status: DC
  Administered 2021-11-18 – 2021-11-19 (×6): 20 mg via ORAL
  Filled 2021-11-17 (×6): qty 1

## 2021-11-17 MED ORDER — SENNOSIDES 8.8 MG/5ML PO SYRP
5.0000 mL | ORAL_SOLUTION | Freq: Two times a day (BID) | ORAL | Status: DC
  Administered 2021-11-17 – 2021-11-21 (×6): 5 mL via ORAL
  Filled 2021-11-17 (×8): qty 5

## 2021-11-17 MED ORDER — POTASSIUM CHLORIDE IN NACL 20-0.9 MEQ/L-% IV SOLN
INTRAVENOUS | Status: DC
  Filled 2021-11-17 (×7): qty 1000

## 2021-11-17 MED ORDER — SODIUM CHLORIDE 0.9 % IV SOLN
6.2500 mg | Freq: Four times a day (QID) | INTRAVENOUS | Status: DC | PRN
  Administered 2021-11-17: 6.25 mg via INTRAVENOUS
  Filled 2021-11-17 (×9): qty 0.25

## 2021-11-17 NOTE — Consult Note (Addendum)
CONSULT PROGRESS NOTE    Tasha Moss  ZDG:[id number] DOB: Mar 01, 1936 DOA: 11/06/2021 PCP: Tasha Minium, MD   Requesting physician: Dr. Yetta Moss  Reason for consultation: AMS   Brief Narrative:   Tasha Moss is a 85 y.o. female with medical history significant of hypothyroidism, GERD, HLD, HTN. Presenting with left hip pain. She was seen in the [facility] ED for hip pain and found to have a dislocated hip prosthesis. A closed reduction was performed and she was discharged to home. She was followed by orthopedics afterwards. She was still in significant pain and unable to walk. Her family was having difficulty taking care of her. So, she was admitted to the orthopedic service. Originally consulted for [employer] . She is tolerating her medication. Reconsulted for AMS. Apparently she has had a couple of dizziness and confusion at night. She has been calling her daughter in the middle of the night afraid of different objects.   Assessment & Plan: Altered mental status     - she is alert and oriented x 3, but still seems somewhat confused.     - UA is negative, CBC is ok, no fevers     - most likely sundowning; regulate sleep cycles, keep room bright and interactive during the day; keep things quiet at night; frequent visitation by family would be helpful     - will add melatonin for sleep  Vertigo     - she is chronically battling vertigo     - continue meclizine     - will have PT look at her for vertigo     - continue PRN nausea meds  Hypokalemia     - primary is running K+ in fluids, agree, continue BMP checks  Dysphagia     - primary has requested SLP eval; agree  Remainder per primary team  Procedures:  Closed reduction of hip dislocation  Antimicrobials:  Paxlovid   ROS: Remainder 10-pt ROS is negative for all not previously mentioned.  Subjective: "I don't know what you've done"  Objective: Vitals:   11/16/21 2144 11/17/21 0601 11/17/21 1237 11/17/21  1346  BP: (!) 128/57 (!) 125/57 (!) 147/62 (!) 141/56  Pulse: 70 71 79 78  Resp: 17 16 18    Temp: 98.4 F (36.9 C) 98 F (36.7 C) 98 F (36.7 C) 98 F (36.7 C)  TempSrc: Oral Oral Oral Oral  SpO2: 94% 94% 91% 93%  Weight:      Height:        Intake/Output Summary (Last 24 hours) at 11/17/2021 1523 Last data filed at 11/17/2021 0601 Gross per 24 hour  Intake 360 ml  Output 900 ml  Net -540 ml   Filed Weights   11/07/21 1034  Weight: 64.9 kg    Examination:  General: 85 y.o. female resting in bed in NAD Cardiovascular: RRR, +S1, S2, no m/g/r, equal pulses throughout Respiratory: CTABL, no w/r/r, normal WOB GI: BS+, NDNT, no masses noted, no organomegaly noted MSK: No e/c/c Neuro: A&O x 3, no focal deficits Psyc: Somewhat confused, but calm/cooperative   Data Reviewed: I have personally reviewed following labs and imaging studies.  CBC: Recent Labs  Lab 11/11/21 0325 11/14/21 0751 11/17/21 1454  WBC 6.3 5.4 7.9  NEUTROABS  --   --  6.0  HGB 10.3* 10.9* 10.8*  HCT 29.1* 31.2* 31.9*  MCV 89.0 90.2 91.4  PLT 203 214 595   Basic Metabolic Panel: Recent Labs  Lab 11/12/21 0333 11/14/21 0751 11/15/21  9485 11/16/21 0341 11/17/21 0336  NA 136 136 138 139 139  K 3.6 3.2* 3.5 3.6 3.3*  CL 102 98 100 101 100  CO2 30 30 30 30 29   GLUCOSE 98 119* 101* 88 95  BUN 8 8 8  7* 6*  CREATININE 0.57 0.40* 0.48 0.58 0.52  CALCIUM 7.6* 8.1* 8.1* 8.3* 8.6*   GFR: Estimated Creatinine Clearance: 47.9 mL/min (by C-G formula based on SCr of 0.52 mg/dL). Liver Function Tests: Recent Labs  Lab 11/15/21 0326 11/16/21 0341 11/17/21 0336  AST 20 20 17   ALT 24 21 19   ALKPHOS 34* 36* 37*  BILITOT 0.6 0.4 0.7  PROT 5.0* 5.2* 5.6*  ALBUMIN 2.6* 2.6* 2.6*   No results for input(s): LIPASE, AMYLASE in the last 168 hours. No results for input(s): AMMONIA in the last 168 hours. Coagulation Profile: No results for input(s): INR, PROTIME in the last 168 hours. Cardiac  Enzymes: No results for input(s): CKTOTAL, CKMB, CKMBINDEX, TROPONINI in the last 168 hours. BNP (last 3 results) No results for input(s): PROBNP in the last 8760 hours. HbA1C: No results for input(s): HGBA1C in the last 72 hours. CBG: Recent Labs  Lab 11/17/21 1340  GLUCAP 116*   Lipid Profile: No results for input(s): CHOL, HDL, LDLCALC, TRIG, CHOLHDL, LDLDIRECT in the last 72 hours. Thyroid Function Tests: No results for input(s): TSH, T4TOTAL, FREET4, T3FREE, THYROIDAB in the last 72 hours. Anemia Panel: Recent Labs    11/16/21 0341 11/17/21 0336  FERRITIN 235 282   Sepsis Labs: No results for input(s): PROCALCITON, LATICACIDVEN in the last 168 hours.  Recent Results (from the past 240 hour(s))  Resp Panel by RT-PCR (Flu A&B, Covid) Nasopharyngeal Swab     Status: Abnormal   Collection Time: 11/11/21  4:30 PM   Specimen: Nasopharyngeal Swab; Nasopharyngeal(NP) swabs in vial transport medium  Result Value Ref Range Status   SARS Coronavirus 2 by RT PCR POSITIVE (A) NEGATIVE Final    Comment: RESULT CALLED TO, READ BACK BY AND VERIFIED WITH: CHRIS, RN @ 2003 ON 11/11/2021 BY LBROOKS, MLT (NOTE) SARS-CoV-2 target nucleic acids are DETECTED.  The SARS-CoV-2 RNA is generally detectable in upper respiratory specimens during the acute phase of infection. Positive results are indicative of the presence of the identified virus, but do not rule out bacterial infection or co-infection with other pathogens not detected by the test. Clinical correlation with patient history and other diagnostic information is necessary to determine patient infection status. The expected result is Negative.  Fact Sheet for Patients: EntrepreneurPulse.com.au  Fact Sheet for Healthcare Providers: IncredibleEmployment.be  This test is not yet approved or cleared by the [place] FDA and  has been authorized for detection and/or diagnosis of SARS-CoV-2  by FDA under an Emergency Use Authorization (EUA).  This EUA will remain in effect (meaning thi s test can be used) for the duration of  the COVID-19 declaration under Section 564(b)(1) of the Act, 21 U.S.C. section 360bbb-3(b)(1), unless the authorization is terminated or revoked sooner.     Influenza A by PCR NEGATIVE NEGATIVE Final   Influenza B by PCR NEGATIVE NEGATIVE Final    Comment: (NOTE) The Xpert Xpress SARS-CoV-2/FLU/RSV plus assay is intended as an aid in the diagnosis of influenza from Nasopharyngeal swab specimens and should not be used as a sole basis for treatment. Nasal washings and aspirates are unacceptable for Xpert Xpress SARS-CoV-2/FLU/RSV testing.  Fact Sheet for Patients: EntrepreneurPulse.com.au  Fact Sheet for Healthcare Providers: IncredibleEmployment.be  This test  is not yet approved or cleared by the [place] and has been authorized for detection and/or diagnosis of SARS-CoV-2 by FDA under an Emergency Use Authorization (EUA). This EUA will remain in effect (meaning this test can be used) for the duration of the COVID-19 declaration under Section 564(b)(1) of the Act, 21 U.S.C. section 360bbb-3(b)(1), unless the authorization is terminated or revoked.  Performed at [facility], [place] [address], [place], [place] [address]       Radiology Studies: No results found.   Scheduled Meds:  vitamin C  500 mg Oral Daily   cholecalciferol  1,000 Units Oral Daily   docusate sodium  100 mg Oral BID   enoxaparin (LOVENOX) injection  40 mg Subcutaneous Q24H   feeding supplement  1 Container Oral TID BM   fenofibrate  160 mg Oral Daily   FLUoxetine  20 mg Oral Daily   fluticasone  1 spray Each Nare Daily   levothyroxine  125 mcg Oral Q0600   loratadine  10 mg Oral Daily   meclizine  12.5 mg Oral TID   nirmatrelvir/ritonavir EUA  3 tablet Oral BID   pantoprazole  40 mg Oral Daily    pravastatin  40 mg Oral QHS   propranolol ER  60 mg Oral Daily   zinc sulfate  220 mg Oral Daily   Continuous Infusions:  promethazine (PHENERGAN) injection (IM or IVPB)       LOS: 11 days   Thank you for this consult. TRH will follow tomorrow.  Time spent: 40 minutes   Jonnie Finner, DO [facility]  If 7PM-7AM, please contact night-coverage [email or web address] 11/17/2021, 3:23 PM

## 2021-11-17 NOTE — Plan of Care (Signed)
  Problem: Coping: Goal: Level of anxiety will decrease Outcome: Progressing   Problem: Safety: Goal: Ability to remain free from injury will improve Outcome: Progressing   Problem: Respiratory: Goal: Complications related to the disease process, condition or treatment will be avoided or minimized Outcome: Progressing

## 2021-11-17 NOTE — Progress Notes (Signed)
   ORTHOPAEDIC PROGRESS NOTE  s/p   SUBJECTIVE: Reports mild pain about left hip. No chest pain. No SOB. No nausea/vomiting. No other complaints.  I sat patient on side of bed.  She initially complained of dizziness.  This improved when she put her glasses on and was instructed to find a place on the wall to focus.  She also was instructed to drink lots of fluids.  She had two 8 oz cups of water during our session of sitting/  One at bedside and and one once she was in the chair.  She was two plus assist to go from sit to stand but was able to follow commands to walk with a walker to the bedside commode and then from the bedside commode to the chair.    OBJECTIVE: PE:  Vitals:   11/16/21 2144 11/17/21 0601  BP: (!) 128/57 (!) 125/57  Pulse: 70 71  Resp: 17 16  Temp: 98.4 F (36.9 C) 98 F (36.7 C)  SpO2: 94% 94%     ASSESSMENT: Tasha Moss is a 85 y.o. female doing well postoperatively.  PLAN: Weightbearing: WBAT LLE Insicional and dressing care: no dressing Orthopedic device(s): None Showering: ok to shower VTE prophylaxis: Lovenox 40mg  qd  30 days Pain control: pain well controlled after initially getting up. Follow - up plan:  SNF once covid clears.  Encourage patient to get up often to mobilized.  Use bedside commode as often as patient needs.   Contact information:   Adriauna Campton A. Kaleen Mask Physician Assistant [facility] [phone]  11/17/2021, 8:46 AM   Patient ID: Tasha Moss, female   DOB: September 24, 1936, 85 y.o.   MRN: [id number]

## 2021-11-17 NOTE — Progress Notes (Signed)
This narrative note consists of events that occurred on 11/17/21 between 0700-1900.   During medication administration, patient was unable to tolerate medications in whole form despite taking them once at a time. Patient vomited. Remainder of pills were crushed and placed in applesauce. Patient was able to take medications without difficulty. Patient was given Zofran IV. Patient states she has had difficulty with swallowing for several years. Patient reports bread, meat, and some other foods get "caught in her throat". A request for a SLP consult was requested from and ordered by Matthew Saras, PA. Dr. Zachery Dakins agreed to changing certain medications to liquid form at this time.   Assessment completed to determine effectiveness of antiemetic. Patient begins stating that someone had given her a cup of pills without any water and they were stuck in her throat. Advised patient that no one had been in the room other than nurse. Asked patient if she had been dreaming. Patient states she had not been dreaming and that event really happened. Patient begins crying and saying she does not know what is happening. Nurse remains with patient and attempts to console her. Patient left in bed with HOB elevated and call bell within reach. Patient is calm.    Nurse contacted PA Shepperson and advised her of this patient event. New orders were obtained. Daughter [name] was notified of change in patient status. Patient was toileted. Urine specimen obtained for UA & CS.   Frequent visits were made to patient to ensure she was safe and comfortable. PA Shepperson contacted [facility] for consult. Visited with patient after Dr. Marylyn Ishihara assessed her. Patient begins to verbalize that people were under her bed and that people were doing things to her back that caused pain. Advised patient that no one was in the room and that she had not any procedure to her back. Patient begins to cry and say that it was real what happened. Patient  contacted her husband via video chat. Husband stated patient begins to act this way after taking pain medication. Advised patient's spouse she had not had any pain medication. Spoke with patient's daughter [name] again regarding this event. Notified Dr. Marylyn Ishihara of this event.   Patient was left in bed with HOB elevated. Repositioned to left side. Call bell with reach. Denied pain or nausea. Continued to monitor.

## 2021-11-17 NOTE — Plan of Care (Signed)
  Problem: Health Behavior/Discharge Planning: Goal: Ability to manage health-related needs will improve Outcome: Progressing   Problem: Clinical Measurements: Goal: Ability to maintain clinical measurements within normal limits will improve Outcome: Progressing Goal: Will remain free from infection Outcome: Progressing Goal: Diagnostic test results will improve Outcome: Progressing Goal: Respiratory complications will improve Outcome: Progressing Goal: Cardiovascular complication will be avoided Outcome: Progressing   Problem: Activity: Goal: Risk for activity intolerance will decrease Outcome: Progressing   Problem: Nutrition: Goal: Adequate nutrition will be maintained Outcome: Progressing   Problem: Coping: Goal: Level of anxiety will decrease Outcome: Progressing   Problem: Elimination: Goal: Will not experience complications related to bowel motility Outcome: Progressing   Problem: Safety: Goal: Ability to remain free from injury will improve Outcome: Progressing   Problem: Education: Goal: Knowledge of risk factors and measures for prevention of condition will improve Outcome: Progressing   Problem: Coping: Goal: Psychosocial and spiritual needs will be supported Outcome: Progressing   Problem: Respiratory: Goal: Complications related to the disease process, condition or treatment will be avoided or minimized Outcome: Progressing

## 2021-11-18 DIAGNOSIS — T84021A Dislocation of internal left hip prosthesis, initial encounter: Principal | ICD-10-CM

## 2021-11-18 LAB — COMPREHENSIVE METABOLIC PANEL
ALT: 16 U/L (ref 0–44)
AST: 16 U/L (ref 15–41)
Albumin: 2.6 g/dL — ABNORMAL LOW (ref 3.5–5.0)
Alkaline Phosphatase: 37 U/L — ABNORMAL LOW (ref 38–126)
Anion gap: 8 (ref 5–15)
BUN: 6 mg/dL — ABNORMAL LOW (ref 8–23)
CO2: 26 mmol/L (ref 22–32)
Calcium: 8.1 mg/dL — ABNORMAL LOW (ref 8.9–10.3)
Chloride: 104 mmol/L (ref 98–111)
Creatinine, Ser: 0.5 mg/dL (ref 0.44–1.00)
GFR, Estimated: >60 mL/min (ref >60)
Glucose, Bld: 108 mg/dL — ABNORMAL HIGH (ref 70–99)
Potassium: 3.5 mmol/L (ref 3.5–5.1)
Sodium: 138 mmol/L (ref 135–145)
Total Bilirubin: 0.6 mg/dL (ref 0.3–1.2)
Total Protein: 5.3 g/dL — ABNORMAL LOW (ref 6.5–8.1)

## 2021-11-18 LAB — FERRITIN: Ferritin: 269 ng/mL (ref 11–307)

## 2021-11-18 LAB — D-DIMER, QUANTITATIVE: D-Dimer, Quant: 0.63 ug/mL-FEU — ABNORMAL HIGH (ref 0.00–0.50)

## 2021-11-18 LAB — C-REACTIVE PROTEIN: CRP: 2.6 mg/dL — ABNORMAL HIGH (ref <1.0)

## 2021-11-18 MED ORDER — DICLOFENAC SODIUM 1 % EX GEL
2.0000 g | Freq: Four times a day (QID) | CUTANEOUS | Status: DC | PRN
  Administered 2021-11-20 – 2021-11-21 (×2): 2 g via TOPICAL
  Filled 2021-11-18 (×2): qty 100

## 2021-11-18 NOTE — Plan of Care (Signed)
  Problem: Safety: Goal: Ability to remain free from injury will improve Outcome: Progressing   Problem: Coping: Goal: Level of anxiety will decrease Outcome: Progressing   

## 2021-11-18 NOTE — TOC Progression Note (Addendum)
Transition of Care [facility]) - Progression Note    Patient Details  Name: ANJANETTE GILKEY MRN: [id number] Date of Birth: 17-Feb-1936  [facility] of [facility]) CM/SW Contact  Ross Ludwig, [place] Phone Number: 11/18/2021, 4:40 PM  Clinical Narrative:     CSW continuing to follow patient's progress throughout discharge planning.  Patient can not go to SNF till 12/14 due to being Covid+, [facility] will have to be restarted.  Updated clinicals sen to SNFs.   Expected Discharge Plan: [place] Barriers to Discharge: Continued Medical Work up, Ship broker, SNF Pending bed offer  Expected Discharge Plan and Services Expected Discharge Plan: [place] In-house Referral: Clinical Social Work   Post Acute Care Choice: [place] Living arrangements for the past 2 months: Single Family Home                                       Social Determinants of Health (SDOH) Interventions    Readmission Risk Interventions No flowsheet data found.

## 2021-11-18 NOTE — Progress Notes (Signed)
CONSULT PROGRESS NOTE    Tasha Moss  SNK:[id number] DOB: 1936-10-20 DOA: 11/06/2021 PCP: [name], MD   Requesting physician: Dr. Yetta Numbers  Reason for consultation: AMS   Brief Narrative:   Tasha Moss is a 85 y.o. female with medical history significant of hypothyroidism, GERD, HLD, HTN. Presenting with left hip pain. She was seen in the [facility] ED for hip pain and found to have a dislocated hip prosthesis. A closed reduction was performed and she was discharged to home. She was followed by orthopedics afterwards. She was still in significant pain and unable to walk. Her family was having difficulty taking care of her. So, she was admitted to the orthopedic service. Originally consulted for [employer] . She is tolerating her medication. Reconsulted for AMS. Apparently she has had a couple of dizziness and confusion at night. She has been calling her daughter in the middle of the night afraid of different objects.   11/18/2021: Patient was seen and examined at her bedside.  She is alert and confused this morning.  Assessment & Plan: Acute metabolic encephalopathy/delirium suspect contributed by COVID-19 Treat underlying conditions Delirium precaution Regulate sleep and wake cycle Melatonin or trazodone as needed for sleep Fall precautions  COVID-19 viral infection Continue Paxlovid, vitamin D3, vitamin C and zinc Maintaining saturation greater than 92%  Vertigo     - she is chronically battling vertigo     - continue meclizine     - will have PT look at her for vertigo     - continue PRN nausea meds  Resolved post repletion: Hypokalemia     - primary is running K+ in fluids, agree, continue BMP checks  Dysphagia     - primary has requested SLP eval; agree  Remainder per primary team  Procedures:  Closed reduction of hip dislocation  Antimicrobials:  Paxlovid   ROS: Remainder 10-pt ROS is negative for all not previously  mentioned.  Subjective: "I don't know what you've done"  Objective: Vitals:   11/17/21 1346 11/17/21 2142 11/18/21 0524 11/18/21 0945  BP: (!) 141/56 (!) 140/55 (!) 144/55 (!) 152/73  Pulse: 78 83 75 78  Resp:  16 16   Temp: 98 F (36.7 C) 98.2 F (36.8 C) 97.7 F (36.5 C)   TempSrc: Oral Oral Oral   SpO2: 93% 93% 92% 95%  Weight:      Height:        Intake/Output Summary (Last 24 hours) at 11/18/2021 1554 Last data filed at 11/18/2021 0200 Gross per 24 hour  Intake 1363.98 ml  Output 400 ml  Net 963.98 ml   Filed Weights   11/07/21 1034  Weight: 64.9 kg    Examination:  General: 85 y.o. female well-developed well-nourished in no acute distress.  Alert and confused. Cardiovascular: Regular rate and rhythm no rubs or gallops.  Respiratory: Clear to auscultation no wheezes or rales.   GI: Soft nontender normal bowel sounds 1. MSK: No lower extremity edema bilaterally. Neuro: Nonfocal exam. Psyc: Confused this morning.   Data Reviewed: I have personally reviewed following labs and imaging studies.  CBC: Recent Labs  Lab 11/14/21 0751 11/17/21 1454  WBC 5.4 7.9  NEUTROABS  --  6.0  HGB 10.9* 10.8*  HCT 31.2* 31.9*  MCV 90.2 91.4  PLT 214 937   Basic Metabolic Panel: Recent Labs  Lab 11/14/21 0751 11/15/21 0326 11/16/21 0341 11/17/21 0336 11/18/21 0342  NA 136 138 139 139 138  K 3.2* 3.5 3.6  3.3* 3.5  CL 98 100 101 100 104  CO2 30 30 30 29 26   GLUCOSE 119* 101* 88 95 108*  BUN 8 8 7* 6* 6*  CREATININE 0.40* 0.48 0.58 0.52 0.50  CALCIUM 8.1* 8.1* 8.3* 8.6* 8.1*   GFR: Estimated Creatinine Clearance: 47.9 mL/min (by C-G formula based on SCr of 0.5 mg/dL). Liver Function Tests: Recent Labs  Lab 11/15/21 0326 11/16/21 0341 11/17/21 0336 11/18/21 0342  AST 20 20 17 16   ALT 24 21 19 16   ALKPHOS 34* 36* 37* 37*  BILITOT 0.6 0.4 0.7 0.6  PROT 5.0* 5.2* 5.6* 5.3*  ALBUMIN 2.6* 2.6* 2.6* 2.6*   No results for input(s): LIPASE, AMYLASE in the  last 168 hours. No results for input(s): AMMONIA in the last 168 hours. Coagulation Profile: No results for input(s): INR, PROTIME in the last 168 hours. Cardiac Enzymes: No results for input(s): CKTOTAL, CKMB, CKMBINDEX, TROPONINI in the last 168 hours. BNP (last 3 results) No results for input(s): PROBNP in the last 8760 hours. HbA1C: No results for input(s): HGBA1C in the last 72 hours. CBG: Recent Labs  Lab 11/17/21 1340  GLUCAP 116*   Lipid Profile: No results for input(s): CHOL, HDL, LDLCALC, TRIG, CHOLHDL, LDLDIRECT in the last 72 hours. Thyroid Function Tests: No results for input(s): TSH, T4TOTAL, FREET4, T3FREE, THYROIDAB in the last 72 hours. Anemia Panel: Recent Labs    11/17/21 0336 11/18/21 0342  FERRITIN 282 269   Sepsis Labs: No results for input(s): PROCALCITON, LATICACIDVEN in the last 168 hours.  Recent Results (from the past 240 hour(s))  Resp Panel by RT-PCR (Flu A&B, Covid) Nasopharyngeal Swab     Status: Abnormal   Collection Time: 11/11/21  4:30 PM   Specimen: Nasopharyngeal Swab; Nasopharyngeal(NP) swabs in vial transport medium  Result Value Ref Range Status   SARS Coronavirus 2 by RT PCR POSITIVE (A) NEGATIVE Final    Comment: RESULT CALLED TO, READ BACK BY AND VERIFIED WITH: CHRIS, RN @ 2003 ON 11/11/2021 BY LBROOKS, MLT (NOTE) SARS-CoV-2 target nucleic acids are DETECTED.  The SARS-CoV-2 RNA is generally detectable in upper respiratory specimens during the acute phase of infection. Positive results are indicative of the presence of the identified virus, but do not rule out bacterial infection or co-infection with other pathogens not detected by the test. Clinical correlation with patient history and other diagnostic information is necessary to determine patient infection status. The expected result is Negative.  Fact Sheet for Patients: EntrepreneurPulse.com.au  Fact Sheet for Healthcare  Providers: IncredibleEmployment.be  This test is not yet approved or cleared by the [place] FDA and  has been authorized for detection and/or diagnosis of SARS-CoV-2 by FDA under an Emergency Use Authorization (EUA).  This EUA will remain in effect (meaning thi s test can be used) for the duration of  the COVID-19 declaration under Section 564(b)(1) of the Act, 21 U.S.C. section 360bbb-3(b)(1), unless the authorization is terminated or revoked sooner.     Influenza A by PCR NEGATIVE NEGATIVE Final   Influenza B by PCR NEGATIVE NEGATIVE Final    Comment: (NOTE) The Xpert Xpress SARS-CoV-2/FLU/RSV plus assay is intended as an aid in the diagnosis of influenza from Nasopharyngeal swab specimens and should not be used as a sole basis for treatment. Nasal washings and aspirates are unacceptable for Xpert Xpress SARS-CoV-2/FLU/RSV testing.  Fact Sheet for Patients: EntrepreneurPulse.com.au  Fact Sheet for Healthcare Providers: IncredibleEmployment.be  This test is not yet approved or cleared by the [place]  States FDA and has been authorized for detection and/or diagnosis of SARS-CoV-2 by FDA under an Emergency Use Authorization (EUA). This EUA will remain in effect (meaning this test can be used) for the duration of the COVID-19 declaration under Section 564(b)(1) of the Act, 21 U.S.C. section 360bbb-3(b)(1), unless the authorization is terminated or revoked.  Performed at [facility], [place] [address], [place], [place] [address]       Radiology Studies: No results found.   Scheduled Meds:  vitamin C  500 mg Oral Daily   cholecalciferol  1,000 Units Oral Daily   enoxaparin (LOVENOX) injection  40 mg Subcutaneous Q24H   feeding supplement  1 Container Oral TID BM   fenofibrate  160 mg Oral Daily   FLUoxetine  20 mg Oral Daily   fluticasone  1 spray Each Nare Daily   levothyroxine  125 mcg Oral  Q0600   loratadine  10 mg Oral Daily   meclizine  12.5 mg Oral TID   melatonin  3 mg Oral QHS   nirmatrelvir/ritonavir EUA  3 tablet Oral BID   pantoprazole sodium  40 mg Oral Daily   pravastatin  40 mg Oral QHS   propranolol  20 mg Oral TID   sennosides  5 mL Oral BID   zinc sulfate  220 mg Oral Daily   Continuous Infusions:  0.9 % NaCl with KCl 20 mEq / L 100 mL/hr at 11/18/21 1422   promethazine (PHENERGAN) injection (IM or IVPB) 6.25 mg (11/17/21 1623)     LOS: 12 days   Thank you for this consult. TRH will follow tomorrow.  Time spent: 40 minutes   Kayleen Memos, DO [facility]  If 7PM-7AM, please contact night-coverage [email or web address] 11/18/2021, 3:54 PM

## 2021-11-18 NOTE — Progress Notes (Signed)
Patient ID: Tasha Moss, female   DOB: 08/06/1936, 85 y.o.   MRN: [id number]   ORTHOPAEDIC PROGRESS NOTE  s/p   SUBJECTIVE: Patient alert and oriented today much left confused.  She is aware that she is having night time sundowning episodes.  She [place] up with lunch in front of her but has not eaten.  Reports mild pain about operative site. No chest pain. No SOB. No nausea/vomiting. No other complaints.  OBJECTIVE: PE:  Vitals:   11/18/21 0524 11/18/21 0945  BP: (!) 144/55 (!) 152/73  Pulse: 75 78  Resp: 16   Temp: 97.7 F (36.5 C)   SpO2: 92% 95%     ASSESSMENT: Tasha Moss is a 85 y.o. female doing well postoperatively.  PLAN: Weightbearing: WBAT LLE Insicional and dressing care: no wound Orthopedic device(s): None Showering:  ok to shower with assistance VTE prophylaxis: Lovenox 40mg  qd  Pain control:pain well controlled on tylenol Follow - up plan:  awaiting medical ok to transfer to SNF.  She was supposed to transfer on 12/9 but tested positive for Covid instead .  Needs to get up with Physical therapy.  Awaiting a swallow eval due to difficulty swallowing pills and solid food.   Contact information:   Gwendolin Briel A. Kaleen Mask Physician Assistant [facility] [phone]  11/18/2021, 4:09 PM

## 2021-11-19 ENCOUNTER — Inpatient Hospital Stay (HOSPITAL_COMMUNITY): Payer: Medicare Other

## 2021-11-19 LAB — COMPREHENSIVE METABOLIC PANEL
ALT: 14 U/L (ref 0–44)
AST: 14 U/L — ABNORMAL LOW (ref 15–41)
Albumin: 2.4 g/dL — ABNORMAL LOW (ref 3.5–5.0)
Alkaline Phosphatase: 31 U/L — ABNORMAL LOW (ref 38–126)
Anion gap: 6 (ref 5–15)
BUN: <5 mg/dL — ABNORMAL LOW (ref 8–23)
CO2: 28 mmol/L (ref 22–32)
Calcium: 7.8 mg/dL — ABNORMAL LOW (ref 8.9–10.3)
Chloride: 105 mmol/L (ref 98–111)
Creatinine, Ser: 0.4 mg/dL — ABNORMAL LOW (ref 0.44–1.00)
GFR, Estimated: >60 mL/min (ref >60)
Glucose, Bld: 99 mg/dL (ref 70–99)
Potassium: 3.4 mmol/L — ABNORMAL LOW (ref 3.5–5.1)
Sodium: 139 mmol/L (ref 135–145)
Total Bilirubin: 0.7 mg/dL (ref 0.3–1.2)
Total Protein: 5.1 g/dL — ABNORMAL LOW (ref 6.5–8.1)

## 2021-11-19 LAB — URINE CULTURE

## 2021-11-19 LAB — MAGNESIUM: Magnesium: 0.9 mg/dL — CL (ref 1.7–2.4)

## 2021-11-19 LAB — FERRITIN: Ferritin: 250 ng/mL (ref 11–307)

## 2021-11-19 LAB — D-DIMER, QUANTITATIVE: D-Dimer, Quant: 0.62 ug/mL-FEU — ABNORMAL HIGH (ref 0.00–0.50)

## 2021-11-19 LAB — C-REACTIVE PROTEIN: CRP: 3.3 mg/dL — ABNORMAL HIGH (ref <1.0)

## 2021-11-19 MED ORDER — MAGNESIUM SULFATE 2 GM/50ML IV SOLN: 2.0000 g | Freq: Once | INTRAVENOUS | Status: DC

## 2021-11-19 MED ORDER — MAGNESIUM SULFATE 4 GM/100ML IV SOLN
4.0000 g | Freq: Once | INTRAVENOUS | Status: AC
  Administered 2021-11-19: 4 g via INTRAVENOUS
  Filled 2021-11-19: qty 100

## 2021-11-19 MED ORDER — POTASSIUM CHLORIDE 10 MEQ/100ML IV SOLN
10.0000 meq | INTRAVENOUS | Status: AC
  Administered 2021-11-19 (×3): 10 meq via INTRAVENOUS
  Filled 2021-11-19 (×3): qty 100

## 2021-11-19 NOTE — Progress Notes (Signed)
Date and time results received: 11/19/21 0708  Test: Magnesium Critical Value: 0.9  Name of Provider Notified: Francia Greaves  Orders Received? IVPB magnesium ordered

## 2021-11-19 NOTE — Evaluation (Signed)
Clinical/Bedside Swallow Evaluation Patient Details  Name: Tasha Moss MRN: [id number] Date of Birth: 26-Nov-1936  Today's Date: 11/19/2021 Time: SLP Start Time (ACUTE ONLY): 0950 SLP Stop Time (ACUTE ONLY): 1010 SLP Time Calculation (min) (ACUTE ONLY): 20 min  Past Medical History:  Past Medical History:  Diagnosis Date   Allergy    seasonal & dust   Anxiety    Arthritis    DJD   Family history of adverse reaction to anesthesia    n/v    GERD (gastroesophageal reflux disease)    History of vertigo    Hyperlipidemia    Hypertension    Hypothyroidism    PONV (postoperative nausea and vomiting)    Thyroid disease    hypothyroidism post Hashimoto's & partial thyroidectomy   Past Surgical History:  Past Surgical History:  Procedure Laterality Date   CATARACT EXTRACTION W/ INTRAOCULAR LENS  IMPLANT, BILATERAL     COLONOSCOPY     diverticulosis   JOINT REPLACEMENT  1995 & 1999   THR bilaterally    lumbar disc repair  1989   Dr Arrie Aran   SPINE SURGERY     cervical ; Dr Vertell Limber   THYROIDECTOMY, PARTIAL     benign nodule   TONSILLECTOMY AND ADENOIDECTOMY     TOTAL SHOULDER ARTHROPLASTY Left 02/27/2016   Procedure: LEFT REVERSE TOTAL SHOULDER ARTHROPLASTY;  Surgeon: Renette Butters, MD;  Location: [facility];  Service: Orthopedics;  Laterality: Left;   vitreous detachment     Dr Claudean Kinds   HPI:  Pt is an 85 year old female admitted 11/06/21 with hx of L THA performed 1995 with closed hip dislocation 11/28 and currently admited due to limited mobility and poor pain control. tested Covid positive 12/5.  PMHx significant for reverse total shoulder arthroplasty 2017, arthritis, GERD, hypertension, hyperlipidemia, bilateral hip replacements, vertigo. BSE was ordered secondary to patient c/o difficulty swallowing pills and solid foods.    Assessment / Plan / Recommendation  Clinical Impression  Patient presents with clinical s/s of dysphagia as per this bedside swallow  evaluation. Based on her history of GERD and her recent c/o burning/irritation of hard palate and dryness and soreness of mouth and throat, suspect patient's dysphagia is primarily esophageal in nature but cannot r/o a pharyngeal component as well. Patient did exhibit a couple instances of delayed throat clearing after taking sips of water, however this did not occur consistently or frequently. Patient reports that at home she cuts meats up into bite size pieces but does not make any other modifications. SLP is not recommending to change diet consistencies at this time, however will plan to follow up at least one more time to ensure diet toleration and provide any further education that is needed. SLP Visit Diagnosis: Dysphagia, unspecified (R13.10)    Aspiration Risk  No limitations;Mild aspiration risk    Diet Recommendation Regular;Thin liquid   Liquid Administration via: Straw;Cup Medication Administration: Other (Comment) (whole in puree if small, crush in puree if large) Supervision: Patient able to self feed Compensations: Slow rate;Small sips/bites Postural Changes: Seated upright at 90 degrees;Remain upright for at least 30 minutes after po intake    Other  Recommendations Oral Care Recommendations: Oral care BID;Patient independent with oral care    Recommendations for follow up therapy are one component of a multi-disciplinary discharge planning process, led by the attending physician.  Recommendations may be updated based on patient status, additional functional criteria and insurance authorization.  Follow up Recommendations No SLP follow  up      Assistance Recommended at Discharge None  Functional Status Assessment Patient has had a recent decline in their functional status and demonstrates the ability to make significant improvements in function in a reasonable and predictable amount of time.  Frequency and Duration min 1 x/week  1 week       Prognosis Prognosis for Safe  Diet Advancement: Good      Swallow Study   General Date of Onset: 11/17/21 HPI: Pt is an 85 year old female admitted 11/06/21 with hx of L THA performed 1995 with closed hip dislocation 11/28 and currently admited due to limited mobility and poor pain control. tested Covid positive 12/5.  PMHx significant for reverse total shoulder arthroplasty 2017, arthritis, GERD, hypertension, hyperlipidemia, bilateral hip replacements, vertigo. BSE was ordered secondary to patient c/o difficulty swallowing pills and solid foods. Type of Study: Bedside Swallow Evaluation Previous Swallow Assessment: none found Diet Prior to this Study: Regular;Thin liquids Temperature Spikes Noted: No Respiratory Status: Room air History of Recent Intubation: No Behavior/Cognition: Alert;Cooperative;Pleasant mood Oral Cavity Assessment: Within Functional Limits Oral Care Completed by SLP: Yes Oral Cavity - Dentition: Adequate natural dentition Vision: Functional for self-feeding Self-Feeding Abilities: Able to feed self Patient Positioning: Upright in bed Baseline Vocal Quality: Normal Volitional Cough: Strong Volitional Swallow: Able to elicit    Oral/Motor/Sensory Function Overall Oral Motor/Sensory Function: Within functional limits   Ice Chips     Thin Liquid Thin Liquid: Impaired Presentation: Cup;Self Fed Pharyngeal  Phase Impairments: Throat Clearing - Delayed Other Comments: inconsistent, delayed throat clearing which patient feels is from her dry/sore throat and mouth    Nectar Thick     Honey Thick     Puree Puree: Not tested   Solid     Solid: Within functional limits      Sonia Baller, MA, CCC-SLP Speech Therapy

## 2021-11-19 NOTE — Progress Notes (Addendum)
Physical Therapy Treatment Patient Details Name: Tasha Moss MRN: [id number] DOB: 1936-09-08 Today's Date: 11/19/2021   History of Present Illness Pt is an 85 year old female admitted 11/06/21 with hx of L THA performed 1995 with closed hip dislocation 11/28 and currently admited due to limited mobility and poor pain control. tested Covid positive 12/5.  PMHx significant for reverse total shoulder arthroplasty 2017, arthritis, GERD, hypertension, hyperlipidemia, bilateral hip replacements, vertigo    PT Comments    Patient made good progress with mobility today and ambulated functional distance to bathroom and back to bed, 2x14' with RW. She required Mod +2 assist to steady and manage walker safely. Pt has flexed posture throughout  and NBOS. She required cues to avoid excessive flexion and abduction in sitting. Patient instructed on exercises for Lt LE ROM/strength. EOS in bed resting with coloring supplies for enjoyment and SCD's donned for circulation/DVT prevention. Pt denied vertigo/dizziness throughout session and reports it is chronic and intermittent. Will continue to assess but no testing completed as pt not symptomatic. Acute PT will continue to progress pt as able; continue to recommend SNF setting for follow up.    Recommendations for follow up therapy are one component of a multi-disciplinary discharge planning process, led by the attending physician.  Recommendations may be updated based on patient status, additional functional criteria and insurance authorization.  Follow Up Recommendations  Skilled nursing-short term rehab (<3 hours/day)     Assistance Recommended at Discharge Frequent or constant Supervision/Assistance  Equipment Recommendations  None recommended by PT    Recommendations for Other Services       Precautions / Restrictions Precautions Precautions: Fall;Posterior Hip Precaution Comments: Pt recalls 2/3 THP - all precautions reviewed Other Brace: Order  to DC hip abduction brace, only place pillow between kness Restrictions Weight Bearing Restrictions: No LLE Weight Bearing: Weight bearing as tolerated     Mobility  Bed Mobility Overal bed mobility: Needs Assistance Bed Mobility: Sit to Supine       Sit to supine: Mod assist;+2 for safety/equipment;+2 for physical assistance;HOB elevated   General bed mobility comments: Mod +2    Transfers Overall transfer level: Needs assistance Equipment used: Rolling walker (2 wheels) Transfers: Sit to/from Stand Sit to Stand: Mod assist;+2 physical assistance;+2 safety/equipment           General transfer comment: Mod+2 to initiate and steady power up. cues needs for hand placement for safety and pt with moderate posterior lean and flexed posture in standing. cues for chest upright and tactiel cue to weight shift anteriorly. pt stood from recliner and then toilet. cues for posterior hip precautions with sitting on low surface.    Ambulation/Gait Ambulation/Gait assistance: Mod assist;+2 safety/equipment Gait Distance (Feet): 28 Feet (2x14) Assistive device: Rolling walker (2 wheels) Gait Pattern/deviations: Step-to pattern;Trunk flexed;Decreased stride length;Narrow base of support;Shuffle Gait velocity: decr     General Gait Details: reduced foot clearance and shuffling steps, cuse for upright posture during gait. Mod +2 to steady and manage RW position.   Stairs             Wheelchair Mobility    Modified Rankin (Stroke Patients Only)       Balance Overall balance assessment: Needs assistance Sitting-balance support: Feet supported Sitting balance-Leahy Scale: Fair     Standing balance support: Bilateral upper extremity supported;Reliant on assistive device for balance Standing balance-Leahy Scale: Poor Standing balance comment: reliance on RW and support  Cognition Arousal/Alertness: Awake/alert Behavior During Therapy:  WFL for tasks assessed/performed Overall Cognitive Status: Within Functional Limits for tasks assessed                                          Exercises Total Joint Exercises Ankle Circles/Pumps: AROM;Both;20 reps;Supine Quad Sets: AROM;Supine;20 reps;Left (2x10) Short Arc Quad: Left;AROM;10 reps;Supine Heel Slides: AAROM;Left;20 reps;Supine (2x10) Hip ABduction/ADduction: AAROM;Left;Supine;10 reps    General Comments        Pertinent Vitals/Pain Pain Assessment: 0-10 Faces Pain Scale: Hurts little more Pain Location: left hip Pain Descriptors / Indicators: Aching;Sore Pain Intervention(s): Monitored during session    Home Living                          Prior Function            PT Goals (current goals can now be found in the care plan section) Acute Rehab PT Goals Patient Stated Goal: walk again PT Goal Formulation: With patient Time For Goal Achievement: 11/28/21 Potential to Achieve Goals: Good Progress towards PT goals: Progressing toward goals    Frequency    Min 3X/week      PT Plan Current plan remains appropriate    Co-evaluation              AM-PAC PT "6 Clicks" Mobility   Outcome Measure  Help needed turning from your back to your side while in a flat bed without using bedrails?: A Lot Help needed moving from lying on your back to sitting on the side of a flat bed without using bedrails?: Total Help needed moving to and from a bed to a chair (including a wheelchair)?: Total Help needed standing up from a chair using your arms (e.g., wheelchair or bedside chair)?: Total Help needed to walk in hospital room?: Total Help needed climbing 3-5 steps with a railing? : Total 6 Click Score: 7    End of Session Equipment Utilized During Treatment: Gait belt Activity Tolerance: Patient tolerated treatment well Patient left: in bed;with call bell/phone within reach;with bed alarm set Nurse Communication: Mobility  status;Precautions PT Visit Diagnosis: Muscle weakness (generalized) (M62.81);Difficulty in walking, not elsewhere classified (R26.2) Pain - Right/Left: Left Pain - part of body: Hip     Time: 3845-3646 PT Time Calculation (min) (ACUTE ONLY): 31 min  Charges:  $Gait Training: 8-22 mins $Therapeutic Exercise: 8-22 mins                     Verner Mould, DPT Acute Rehabilitation Services Office [phone] Pager [phone]     Jacques Navy 11/19/2021, 4:50 PM

## 2021-11-19 NOTE — Plan of Care (Signed)
Plan of care reviewed and discussed. °

## 2021-11-19 NOTE — Progress Notes (Signed)
CONSULT PROGRESS NOTE    Tasha Moss  ALP:[id number] DOB: 08/09/1936 DOA: 11/06/2021 PCP: [name], MD   Requesting physician: Dr. Yetta Numbers  Reason for consultation: AMS   Brief Narrative:   Tasha Moss is a 85 y.o. female with medical history significant of hypothyroidism, GERD, HLD, HTN. Presenting with left hip pain. She was seen in the [facility] ED for hip pain and found to have a dislocated hip prosthesis. A closed reduction was performed and she was discharged to home. She was followed by orthopedics afterwards. She was still in significant pain and unable to walk. Her family was having difficulty taking care of her. So, she was admitted to the orthopedic service. Originally consulted for [employer] .  Re-consulted for AMS likely from delirium secondary to acute illness.  Nonpharmacological delirium precautions in place.  11/19/2021: Patient was seen and examined at bedside.  She is more alert and oriented x3 today.  She has no new complaints.  Assessment & Plan: Acute metabolic encephalopathy suspect delirium from acute illness, COVID-19 viral infection. Treat underlying conditions Continue nonpharmacological delirium precautions Out of bed to chair during the day, open blinds during the day, reorient as needed during the day. Regulate sleep and wake cycle, continue melatonin nightly. Continue fall precautions.  COVID-19 viral infection Continue Paxlovid, vitamin D3, vitamin C and zinc Maintaining saturation greater than 92%  Refractory Serum magnesium 0.9 Repleted intravenously with 4 g IV magnesium supplement x1 dose.  Moderate protein calorie malnutrition Serum albumin 2.4 Moderate muscle mass loss Encourage increase in oral protein calorie intake  Vertigo     - she is chronically battling vertigo      -Continue home meclizine     - will have PT look at her for vertigo     - continue PRN nausea meds      -Continue fall precautions.  Refractory  hypokalemia in the setting of hypomagnesemia.     - primary is running K+ in fluids, agree, continue BMP checks 30 mEq IV KCl ordered on 11/27/2021 for serum potassium of 3.4 and magnesium of 0.9, repleted intravenously. Repeat BMP and magnesium level in the morning.  Dysphagia     - primary has requested SLP eval; agree Aspiration precautions  Remainder per primary team  Procedures:  Closed reduction of hip dislocation  Antimicrobials:  Paxlovid   ROS: Remainder 10-pt ROS is negative for all not previously mentioned.  Subjective: "I don't know what you've done"  Objective: Vitals:   11/18/21 1717 11/18/21 2123 11/19/21 0603 11/19/21 1224  BP: (!) 159/93 (!) 158/65 (!) 143/62 135/62  Pulse: 79 76 75 79  Resp:  20 20 16   Temp:  99.5 F (37.5 C) 97.7 F (36.5 C) 98.6 F (37 C)  TempSrc:  Oral Oral   SpO2:  92% 93% 94%  Weight:      Height:        Intake/Output Summary (Last 24 hours) at 11/19/2021 1330 Last data filed at 11/19/2021 1041 Gross per 24 hour  Intake 1503.63 ml  Output 300 ml  Net 1203.63 ml   Filed Weights   11/07/21 1034  Weight: 64.9 kg    Examination:  General: 85 y.o. female frail-appearing in no acute distress.  She is alert and oriented x3. Cardiovascular: Regular rate and rhythm with no rubs or gallops.  No JVD or thyromegaly noted.   Respiratory: Clear to auscultation with no wheezes or rales. GI: Soft nontender normal bowel sounds present. MSK: No lower  extremity edema bilaterally. Neuro: Nonfocal exam. Psyc: Mood is appropriate for condition and setting.   Data Reviewed: I have personally reviewed following labs and imaging studies.  CBC: Recent Labs  Lab 11/14/21 0751 11/17/21 1454  WBC 5.4 7.9  NEUTROABS  --  6.0  HGB 10.9* 10.8*  HCT 31.2* 31.9*  MCV 90.2 91.4  PLT 214 810   Basic Metabolic Panel: Recent Labs  Lab 11/15/21 0326 11/16/21 0341 11/17/21 0336 11/18/21 0342 11/19/21 0319  NA 138 139 139 138 139  K  3.5 3.6 3.3* 3.5 3.4*  CL 100 101 100 104 105  CO2 30 30 29 26 28   GLUCOSE 101* 88 95 108* 99  BUN 8 7* 6* 6* <5*  CREATININE 0.48 0.58 0.52 0.50 0.40*  CALCIUM 8.1* 8.3* 8.6* 8.1* 7.8*  MG  --   --   --   --  0.9*   GFR: Estimated Creatinine Clearance: 47.9 mL/min (A) (by C-G formula based on SCr of 0.4 mg/dL (L)). Liver Function Tests: Recent Labs  Lab 11/15/21 0326 11/16/21 0341 11/17/21 0336 11/18/21 0342 11/19/21 0319  AST 20 20 17 16  14*  ALT 24 21 19 16 14   ALKPHOS 34* 36* 37* 37* 31*  BILITOT 0.6 0.4 0.7 0.6 0.7  PROT 5.0* 5.2* 5.6* 5.3* 5.1*  ALBUMIN 2.6* 2.6* 2.6* 2.6* 2.4*   No results for input(s): LIPASE, AMYLASE in the last 168 hours. No results for input(s): AMMONIA in the last 168 hours. Coagulation Profile: No results for input(s): INR, PROTIME in the last 168 hours. Cardiac Enzymes: No results for input(s): CKTOTAL, CKMB, CKMBINDEX, TROPONINI in the last 168 hours. BNP (last 3 results) No results for input(s): PROBNP in the last 8760 hours. HbA1C: No results for input(s): HGBA1C in the last 72 hours. CBG: Recent Labs  Lab 11/17/21 1340  GLUCAP 116*   Lipid Profile: No results for input(s): CHOL, HDL, LDLCALC, TRIG, CHOLHDL, LDLDIRECT in the last 72 hours. Thyroid Function Tests: No results for input(s): TSH, T4TOTAL, FREET4, T3FREE, THYROIDAB in the last 72 hours. Anemia Panel: Recent Labs    11/18/21 0342 11/19/21 0319  FERRITIN 269 250   Sepsis Labs: No results for input(s): PROCALCITON, LATICACIDVEN in the last 168 hours.  Recent Results (from the past 240 hour(s))  Resp Panel by RT-PCR (Flu A&B, Covid) Nasopharyngeal Swab     Status: Abnormal   Collection Time: 11/11/21  4:30 PM   Specimen: Nasopharyngeal Swab; Nasopharyngeal(NP) swabs in vial transport medium  Result Value Ref Range Status   SARS Coronavirus 2 by RT PCR POSITIVE (A) NEGATIVE Final    Comment: RESULT CALLED TO, READ BACK BY AND VERIFIED WITH: CHRIS, RN @ 2003 ON  11/11/2021 BY LBROOKS, MLT (NOTE) SARS-CoV-2 target nucleic acids are DETECTED.  The SARS-CoV-2 RNA is generally detectable in upper respiratory specimens during the acute phase of infection. Positive results are indicative of the presence of the identified virus, but do not rule out bacterial infection or co-infection with other pathogens not detected by the test. Clinical correlation with patient history and other diagnostic information is necessary to determine patient infection status. The expected result is Negative.  Fact Sheet for Patients: EntrepreneurPulse.com.au  Fact Sheet for Healthcare Providers: IncredibleEmployment.be  This test is not yet approved or cleared by the [place] FDA and  has been authorized for detection and/or diagnosis of SARS-CoV-2 by FDA under an Emergency Use Authorization (EUA).  This EUA will remain in effect (meaning thi s test can be  used) for the duration of  the COVID-19 declaration under Section 564(b)(1) of the Act, 21 U.S.C. section 360bbb-3(b)(1), unless the authorization is terminated or revoked sooner.     Influenza A by PCR NEGATIVE NEGATIVE Final   Influenza B by PCR NEGATIVE NEGATIVE Final    Comment: (NOTE) The Xpert Xpress SARS-CoV-2/FLU/RSV plus assay is intended as an aid in the diagnosis of influenza from Nasopharyngeal swab specimens and should not be used as a sole basis for treatment. Nasal washings and aspirates are unacceptable for Xpert Xpress SARS-CoV-2/FLU/RSV testing.  Fact Sheet for Patients: EntrepreneurPulse.com.au  Fact Sheet for Healthcare Providers: IncredibleEmployment.be  This test is not yet approved or cleared by the [place] FDA and has been authorized for detection and/or diagnosis of SARS-CoV-2 by FDA under an Emergency Use Authorization (EUA). This EUA will remain in effect (meaning this test can be used) for the  duration of the COVID-19 declaration under Section 564(b)(1) of the Act, 21 U.S.C. section 360bbb-3(b)(1), unless the authorization is terminated or revoked.  Performed at [facility], [place] [address], [place], [place] [address]   Urine Culture     Status: Abnormal   Collection Time: 11/17/21  2:15 PM   Specimen: Urine, Clean Catch  Result Value Ref Range Status   Specimen Description   Final    URINE, CLEAN CATCH Performed at [facility], [place] [address] [place], [place] [address]    Special Requests   Final    NONE Performed at [facility], [place] [address], [place],  [address]    Culture MULTIPLE SPECIES PRESENT, SUGGEST RECOLLECTION (A)  Final   Report Status 11/19/2021 FINAL  Final      Radiology Studies: DG CHEST PORT 1 VIEW  Result Date: 11/19/2021 CLINICAL DATA:  85 year old female with a history of COVID EXAM: PORTABLE CHEST 1 VIEW COMPARISON:  03/15/2015 FINDINGS: Cardiomediastinal silhouette unchanged in size and contour. No evidence of central vascular congestion. No interlobular septal thickening. Similar appearance of low lung volumes with asymmetric elevation the right hemidiaphragm. Reticular pattern of opacities of the lungs, similar to the comparison. No pneumothorax or pleural effusion. Coarsened interstitial markings, with no confluent airspace disease. No acute displaced fracture. Surgical changes of the left shoulder, incompletely imaged. Surgical changes of the cervical region incompletely imaged. Surgical clips of the lower right neck. IMPRESSION: Low lung volumes and mild reticular opacities, potentially chronic changes/fibrosis versus sequela of viral infection. Electronically Signed   By: Corrie Mckusick D.O.   On: 11/19/2021 09:18     Scheduled Meds:  vitamin C  500 mg Oral Daily   cholecalciferol  1,000 Units Oral Daily   enoxaparin (LOVENOX) injection  40 mg Subcutaneous Q24H   feeding  supplement  1 Container Oral TID BM   fenofibrate  160 mg Oral Daily   FLUoxetine  20 mg Oral Daily   fluticasone  1 spray Each Nare Daily   levothyroxine  125 mcg Oral Q0600   loratadine  10 mg Oral Daily   meclizine  12.5 mg Oral TID   melatonin  3 mg Oral QHS   pantoprazole sodium  40 mg Oral Daily   pravastatin  40 mg Oral QHS   propranolol  20 mg Oral TID   sennosides  5 mL Oral BID   zinc sulfate  220 mg Oral Daily   Continuous Infusions:  0.9 % NaCl with KCl 20 mEq / L 100 mL/hr at 11/19/21 1042   potassium  chloride 10 mEq (11/19/21 1246)   promethazine (PHENERGAN) injection (IM or IVPB) 6.25 mg (11/17/21 1623)     LOS: 13 days   Thank you for this consult. TRH will follow tomorrow.  Time spent: 40 minutes   Kayleen Memos, DO [facility]  If 7PM-7AM, please contact night-coverage [email or web address] 11/19/2021, 1:30 PM

## 2021-11-19 NOTE — Progress Notes (Signed)
     Subjective:  Patient reports doing well this morning. Pain well controlled. AMS appears to be improved as well. Patient alert and oriented. Endorses some soreness in her neck. Voltaren gel has been ordered. Denies CP or SOB. No other issues. Per social work dc to SNF as early as 12/14 due to Covid+.  Objective:   VITALS:   Vitals:   11/18/21 1422 11/18/21 1717 11/18/21 2123 11/19/21 0603  BP: (!) 148/73 (!) 159/93 (!) 158/65 (!) 143/62  Pulse: 85 79 76 75  Resp: 14  20 20   Temp: 98.8 F (37.1 C)  99.5 F (37.5 C) 97.7 F (36.5 C)  TempSrc: Oral  Oral Oral  SpO2: 94%  92% 93%  Weight:      Height:       Tolerates gentle range of motion of the hip including rotation and abduction  and flexion without discomfort. Intact ankle dorsiflexion plantarflexion. Redness to buttock appears resolved at this point  Lab Results  Component Value Date   WBC 7.9 11/17/2021   HGB 10.8 (L) 11/17/2021   HCT 31.9 (L) 11/17/2021   MCV 91.4 11/17/2021   PLT 289 11/17/2021   BMET    Component Value Date/Time   NA 139 11/19/2021 0319   NA 142 02/19/2016 0000   K 3.4 (L) 11/19/2021 0319   CL 105 11/19/2021 0319   CO2 28 11/19/2021 0319   GLUCOSE 99 11/19/2021 0319   BUN <5 (L) 11/19/2021 0319   BUN 13 02/19/2016 0000   CREATININE 0.40 (L) 11/19/2021 0319   CREATININE 0.56 (L) 11/10/2019 0931   CALCIUM 7.8 (L) 11/19/2021 0319   GFRNONAA >60 11/19/2021 0319     Assessment/Plan:  L THA performed 1995 with closed hip dislocation 11/28 now with limited mobility and poor pain control Multimodal pain control: Tylenol, Toradol, Flexeril, tramadol, hydrocodone for breakthrough Weightbearing as tolerated posterior hip precautions - hip abduction brace applied 11/30 - Patient having sores/pain and irritation from hip abduction brace. Discussed risks/benefits of continued brace use and feel at this point risks of pressure sores and pain outweigh the benefits. Recommend instead just use of a  pillow between legs when in bed and continued posterior hip precautions. CT scan 12/1 shows well seated components, no fx, concentric hip reduction, no adverse features Pressure sores posterior sacrum and b/l buttocks - frequent turns, oob, and position changes during the day. Mepilex and barrier cream applied by nursing. CRP downtrending, WBC normal, afebrile - reassuring against infectious process COVID+ (12/4) : 12/7 patient endorsing some mild symptoms.  Paxlovid started 12/7 Continue PT/OT DVT Prophylaxis: lovenox while in hospital, no DVT prophylaxis anticipated post discharged. Discharge: Plan to discharge to SNF given patient's poor mobility and limited support at home.  Discharge planning at this time is delayed given patient's positive COVID test.       Ason Heslin A Purl Claytor 11/19/2021, 6:25 AM   Charlies Constable, MD  Contact information:   [phone] 7am-5pm epic message Dr. Zachery Dakins, or call office for patient follow up: [phone] After hours and holidays please check [email or web address] for group call information for [facility]

## 2021-11-20 LAB — PHOSPHORUS: Phosphorus: 2.2 mg/dL — ABNORMAL LOW (ref 2.5–4.6)

## 2021-11-20 LAB — BASIC METABOLIC PANEL
Anion gap: 6 (ref 5–15)
BUN: <5 mg/dL — ABNORMAL LOW (ref 8–23)
CO2: 24 mmol/L (ref 22–32)
Calcium: 7.4 mg/dL — ABNORMAL LOW (ref 8.9–10.3)
Chloride: 106 mmol/L (ref 98–111)
Creatinine, Ser: 0.41 mg/dL — ABNORMAL LOW (ref 0.44–1.00)
GFR, Estimated: >60 mL/min (ref >60)
Glucose, Bld: 107 mg/dL — ABNORMAL HIGH (ref 70–99)
Potassium: 3.9 mmol/L (ref 3.5–5.1)
Sodium: 136 mmol/L (ref 135–145)

## 2021-11-20 LAB — MAGNESIUM: Magnesium: 1.7 mg/dL (ref 1.7–2.4)

## 2021-11-20 MED ORDER — POTASSIUM & SODIUM PHOSPHATES 280-160-250 MG PO PACK
1.0000 | PACK | Freq: Three times a day (TID) | ORAL | Status: DC
  Administered 2021-11-20 – 2021-11-21 (×5): 1 via ORAL
  Filled 2021-11-20 (×6): qty 1

## 2021-11-20 MED ORDER — OYSTER SHELL CALCIUM/D3 500-5 MG-MCG PO TABS
1.0000 | ORAL_TABLET | Freq: Every day | ORAL | Status: DC
Start: 2021-11-20 — End: 2021-11-21
  Administered 2021-11-20 – 2021-11-21 (×2): 1 via ORAL
  Filled 2021-11-20 (×2): qty 1

## 2021-11-20 MED ORDER — CALCIUM CARBONATE 1250 (500 CA) MG PO TABS
1250.0000 mg | ORAL_TABLET | Freq: Once | ORAL | Status: AC
  Administered 2021-11-20: 1250 mg via ORAL
  Filled 2021-11-20: qty 1

## 2021-11-20 MED ORDER — PROPRANOLOL HCL 20 MG PO TABS
20.0000 mg | ORAL_TABLET | Freq: Two times a day (BID) | ORAL | Status: DC
  Administered 2021-11-20 – 2021-11-21 (×3): 20 mg via ORAL
  Filled 2021-11-20 (×3): qty 1

## 2021-11-20 MED ORDER — POTASSIUM CHLORIDE IN NACL 20-0.9 MEQ/L-% IV SOLN
INTRAVENOUS | Status: AC
  Filled 2021-11-20 (×2): qty 1000

## 2021-11-20 NOTE — Progress Notes (Signed)
Physical Therapy Treatment Patient Details Name: Tasha Moss MRN: [id number] DOB: 09-May-1936 Today's Date: 11/20/2021   History of Present Illness Pt is an 85 year old female admitted 11/06/21 with hx of L THA performed 1995 with closed hip dislocation 11/28 and currently admited due to limited mobility and poor pain control. tested Covid positive 12/5.  PMHx significant for reverse total shoulder arthroplasty 2017, arthritis, GERD, hypertension, hyperlipidemia, bilateral hip replacements, vertigo    PT Comments    Patient continues to  progress gait distance and ambulated short bout in room and then to bathroom and back. Min-mod assist to rise from EOB and recliner however Max assist required from low toilet height. Min assist to steady with RW for gait and pt maintained safe proximity to RW throughout. No LOB noted. EOS instructed pt on seated LE strengthening. Acute PT will continue to progress as able, continue to recommend SNF follow up.    Recommendations for follow up therapy are one component of a multi-disciplinary discharge planning process, led by the attending physician.  Recommendations may be updated based on patient status, additional functional criteria and insurance authorization.  Follow Up Recommendations  Skilled nursing-short term rehab (<3 hours/day)     Assistance Recommended at Discharge Frequent or constant Supervision/Assistance  Equipment Recommendations  None recommended by PT    Recommendations for Other Services       Precautions / Restrictions Precautions Precautions: Fall;Posterior Hip Precaution Comments: Pt recalls 2/3 THP - all precautions reviewed, good memory to extend Lt LE Other Brace: Order to DC hip abduction brace, only place pillow between kness Restrictions Weight Bearing Restrictions: No LLE Weight Bearing: Weight bearing as tolerated     Mobility  Bed Mobility Overal bed mobility: Needs Assistance Bed Mobility: Sit to Supine        Sit to supine: HOB elevated;Min assist   General bed mobility comments: min assist for raising trunk up from EOB. pt able to bring LE's off edge and scoot.    Transfers Overall transfer level: Needs assistance Equipment used: Rolling walker (2 wheels) Transfers: Sit to/from Stand Sit to Stand: Mod assist;Max assist           General transfer comment: Mod assist for rise from EOB and cues for hand placement. pt required bil UE use for power up. Max assist to rise from low toilet height.    Ambulation/Gait Ambulation/Gait assistance: Mod assist Gait Distance (Feet): 50 Feet (14, 2x18) Assistive device: Rolling walker (2 wheels) Gait Pattern/deviations: Trunk flexed;Decreased stride length;Shuffle;Step-through pattern;Decreased step length - right;Decreased step length - left Gait velocity: decr     General Gait Details: cues to maintain safe proixmity to RW and to stand with upright posture. no LOB during gait. pt amb short distance around bed and then to bathroom and back.   Stairs             Wheelchair Mobility    Modified Rankin (Stroke Patients Only)       Balance Overall balance assessment: Needs assistance Sitting-balance support: Feet supported Sitting balance-Leahy Scale: Fair     Standing balance support: Bilateral upper extremity supported;Reliant on assistive device for balance Standing balance-Leahy Scale: Poor Standing balance comment: reliance on RW and support                            Cognition Arousal/Alertness: Awake/alert Behavior During Therapy: WFL for tasks assessed/performed Overall Cognitive Status: Within Functional Limits for tasks assessed  Exercises Total Joint Exercises Ankle Circles/Pumps: AROM;Both;20 reps;Seated Quad Sets: AROM;Seated;Both;10 reps Long Arc Quad: AROM;Both;10 reps;Seated    General Comments        Pertinent Vitals/Pain Pain  Assessment: 0-10 Faces Pain Scale: Hurts little more Pain Location: neck Pain Descriptors / Indicators: Aching;Sore Pain Intervention(s): Limited activity within patient's tolerance;Monitored during session;Repositioned;Ice applied    Home Living                          Prior Function            PT Goals (current goals can now be found in the care plan section) Acute Rehab PT Goals Patient Stated Goal: walk again PT Goal Formulation: With patient Time For Goal Achievement: 11/28/21 Potential to Achieve Goals: Good Progress towards PT goals: Progressing toward goals    Frequency    Min 3X/week      PT Plan Current plan remains appropriate    Co-evaluation              AM-PAC PT "6 Clicks" Mobility   Outcome Measure  Help needed turning from your back to your side while in a flat bed without using bedrails?: A Little Help needed moving from lying on your back to sitting on the side of a flat bed without using bedrails?: A Little Help needed moving to and from a bed to a chair (including a wheelchair)?: A Lot Help needed standing up from a chair using your arms (e.g., wheelchair or bedside chair)?: A Lot Help needed to walk in hospital room?: A Little Help needed climbing 3-5 steps with a railing? : Total 6 Click Score: 14    End of Session Equipment Utilized During Treatment: Gait belt Activity Tolerance: Patient tolerated treatment well Patient left: in bed;with call bell/phone within reach;with bed alarm set Nurse Communication: Mobility status;Precautions PT Visit Diagnosis: Muscle weakness (generalized) (M62.81);Difficulty in walking, not elsewhere classified (R26.2) Pain - Right/Left: Left Pain - part of body: Hip     Time: 1205-1249 PT Time Calculation (min) (ACUTE ONLY): 44 min  Charges:  $Gait Training: 8-22 mins $Therapeutic Exercise: 8-22 mins $Therapeutic Activity: 8-22 mins                     Verner Mould, DPT Acute  Rehabilitation Services Office [phone] Pager [phone]    Jacques Navy 11/20/2021, 1:23 PM

## 2021-11-20 NOTE — Progress Notes (Signed)
CONSULT PROGRESS NOTE    Tasha Moss  YOK:[id number] DOB: [birth year] DOA: 11/06/2021 PCP: [name], MD   Requesting physician: Dr. Yetta Numbers  Reason for consultation: AMS   Brief Narrative:   Tasha Moss is a 85 y.o. female with medical history significant of hypothyroidism, GERD, HLD, HTN. Presenting with left hip pain. She was seen in the [facility] ED for hip pain and found to have a dislocated hip prosthesis. A closed reduction was performed and she was discharged to home. She was followed by orthopedics afterwards. She was still in significant pain and unable to walk. Her family was having difficulty taking care of her. So, she was admitted to the orthopedic service. Originally consulted for [employer] .  Re-consulted for AMS likely from delirium secondary to acute illness.  Nonpharmacological delirium precautions in place.  11/20/2021: She was seen and examined at her bedside.  She has no complaints.  Assessment & Plan: Acute metabolic encephalopathy suspect delirium from acute illness, COVID-19 viral infection. Treat underlying conditions Continue nonpharmacological delirium precautions Out of bed to chair during the day, open blinds during the day, reorient as needed during the day. Regulate sleep and wake cycle, continue melatonin nightly. Continue fall precautions.  Treated COVID-19 viral infection Completed course of antiviral Paxlovid Continue vitamin D3, vitamin C and zinc Oxygen saturation 95% on room air.  Resolved hypomagnesemia Serum magnesium 0.9> 1.7 Repleted intravenously with 4 g IV magnesium supplement x1 dose.  Moderate protein calorie malnutrition Serum albumin 2.4 Moderate muscle mass loss Encourage increase in oral protein calorie intake  Vertigo     - she is chronically battling vertigo      -Continue home meclizine     - will have PT look at her for vertigo     - continue PRN nausea meds      -Continue fall  precautions.  Refractory hypophosphatemia, post repletion Improving serum phosphorus 2.2  Dysphagia, ruled out Seen by speech therapist recommended regular consistency diet and thin liquid Aspiration precautions  Remainder per primary team  Procedures:  Closed reduction of hip dislocation  Antimicrobials:  Paxlovid     Objective: Vitals:   11/19/21 2220 11/20/21 0602 11/20/21 1034 11/20/21 1426  BP: (!) 120/52 (!) 109/59 (!) 144/62 (!) 131/50  Pulse: 84 78 84 87  Resp: 16 17 (!) 22 19  Temp: 98.2 F (36.8 C) 98.9 F (37.2 C) 98.6 F (37 C) 98.4 F (36.9 C)  TempSrc: Oral Oral Oral Oral  SpO2: 91% 92% 96% 95%  Weight:      Height:        Intake/Output Summary (Last 24 hours) at 11/20/2021 1457 Last data filed at 11/20/2021 1420 Gross per 24 hour  Intake 2585.81 ml  Output 1950 ml  Net 635.81 ml   Filed Weights   11/07/21 1034  Weight: 64.9 kg    Examination:  General: 85 y.o. female frail-appearing no acute distress she is alert and oriented x3.   Cardiovascular: Regular rate and rhythm with no rubs or gallops.  No JVD or thyromegaly noted.  Respiratory: Clear patient with no wheezes or rales.   GI: Soft nontender with normal bowel sounds present. MSK: No lower extremity edema bilaterally. Neuro: Nonfocal exam. Psyc: Mood is appropriate for condition.   Data Reviewed: I have personally reviewed following labs and imaging studies.  CBC: Recent Labs  Lab 11/14/21 0751 11/17/21 1454  WBC 5.4 7.9  NEUTROABS  --  6.0  HGB 10.9* 10.8*  HCT  31.2* 31.9*  MCV 90.2 91.4  PLT 214 408   Basic Metabolic Panel: Recent Labs  Lab 11/16/21 0341 11/17/21 0336 11/18/21 0342 11/19/21 0319 11/20/21 0328  NA 139 139 138 139 136  K 3.6 3.3* 3.5 3.4* 3.9  CL 101 100 104 105 106  CO2 30 29 26 28 24   GLUCOSE 88 95 108* 99 107*  BUN 7* 6* 6* <5* <5*  CREATININE 0.58 0.52 0.50 0.40* 0.41*  CALCIUM 8.3* 8.6* 8.1* 7.8* 7.4*  MG  --   --   --  0.9* 1.7  PHOS   --   --   --   --  2.2*   GFR: Estimated Creatinine Clearance: 47.9 mL/min (A) (by C-G formula based on SCr of 0.41 mg/dL (L)). Liver Function Tests: Recent Labs  Lab 11/15/21 0326 11/16/21 0341 11/17/21 0336 11/18/21 0342 11/19/21 0319  AST 20 20 17 16  14*  ALT 24 21 19 16 14   ALKPHOS 34* 36* 37* 37* 31*  BILITOT 0.6 0.4 0.7 0.6 0.7  PROT 5.0* 5.2* 5.6* 5.3* 5.1*  ALBUMIN 2.6* 2.6* 2.6* 2.6* 2.4*   No results for input(s): LIPASE, AMYLASE in the last 168 hours. No results for input(s): AMMONIA in the last 168 hours. Coagulation Profile: No results for input(s): INR, PROTIME in the last 168 hours. Cardiac Enzymes: No results for input(s): CKTOTAL, CKMB, CKMBINDEX, TROPONINI in the last 168 hours. BNP (last 3 results) No results for input(s): PROBNP in the last 8760 hours. HbA1C: No results for input(s): HGBA1C in the last 72 hours. CBG: Recent Labs  Lab 11/17/21 1340  GLUCAP 116*   Lipid Profile: No results for input(s): CHOL, HDL, LDLCALC, TRIG, CHOLHDL, LDLDIRECT in the last 72 hours. Thyroid Function Tests: No results for input(s): TSH, T4TOTAL, FREET4, T3FREE, THYROIDAB in the last 72 hours. Anemia Panel: Recent Labs    11/18/21 0342 11/19/21 0319  FERRITIN 269 250   Sepsis Labs: No results for input(s): PROCALCITON, LATICACIDVEN in the last 168 hours.  Recent Results (from the past 240 hour(s))  Resp Panel by RT-PCR (Flu A&B, Covid) Nasopharyngeal Swab     Status: Abnormal   Collection Time: 11/11/21  4:30 PM   Specimen: Nasopharyngeal Swab; Nasopharyngeal(NP) swabs in vial transport medium  Result Value Ref Range Status   SARS Coronavirus 2 by RT PCR POSITIVE (A) NEGATIVE Final    Comment: RESULT CALLED TO, READ BACK BY AND VERIFIED WITH: CHRIS, RN @ 2003 ON 11/11/2021 BY LBROOKS, MLT (NOTE) SARS-CoV-2 target nucleic acids are DETECTED.  The SARS-CoV-2 RNA is generally detectable in upper respiratory specimens during the acute phase of infection.  Positive results are indicative of the presence of the identified virus, but do not rule out bacterial infection or co-infection with other pathogens not detected by the test. Clinical correlation with patient history and other diagnostic information is necessary to determine patient infection status. The expected result is Negative.  Fact Sheet for Patients: EntrepreneurPulse.com.au  Fact Sheet for Healthcare Providers: IncredibleEmployment.be  This test is not yet approved or cleared by the [place] FDA and  has been authorized for detection and/or diagnosis of SARS-CoV-2 by FDA under an Emergency Use Authorization (EUA).  This EUA will remain in effect (meaning thi s test can be used) for the duration of  the COVID-19 declaration under Section 564(b)(1) of the Act, 21 U.S.C. section 360bbb-3(b)(1), unless the authorization is terminated or revoked sooner.     Influenza A by PCR NEGATIVE NEGATIVE Final  Influenza B by PCR NEGATIVE NEGATIVE Final    Comment: (NOTE) The Xpert Xpress SARS-CoV-2/FLU/RSV plus assay is intended as an aid in the diagnosis of influenza from Nasopharyngeal swab specimens and should not be used as a sole basis for treatment. Nasal washings and aspirates are unacceptable for Xpert Xpress SARS-CoV-2/FLU/RSV testing.  Fact Sheet for Patients: EntrepreneurPulse.com.au  Fact Sheet for Healthcare Providers: IncredibleEmployment.be  This test is not yet approved or cleared by the [place] FDA and has been authorized for detection and/or diagnosis of SARS-CoV-2 by FDA under an Emergency Use Authorization (EUA). This EUA will remain in effect (meaning this test can be used) for the duration of the COVID-19 declaration under Section 564(b)(1) of the Act, 21 U.S.C. section 360bbb-3(b)(1), unless the authorization is terminated or revoked.  Performed at [facility], [place] [address] [place], [place] [address]   Urine Culture     Status: Abnormal   Collection Time: 11/17/21  2:15 PM   Specimen: Urine, Clean Catch  Result Value Ref Range Status   Specimen Description   Final    URINE, CLEAN CATCH Performed at [facility], [place] [address], [place], [place] [address]    Special Requests   Final    NONE Performed at [facility], [place] [address] [place], [place] [address]    Culture MULTIPLE SPECIES PRESENT, SUGGEST RECOLLECTION (A)  Final   Report Status 11/19/2021 FINAL  Final      Radiology Studies: DG CHEST PORT 1 VIEW  Result Date: 11/19/2021 CLINICAL DATA:  85 year old female with a history of COVID EXAM: PORTABLE CHEST 1 VIEW COMPARISON:  03/15/2015 FINDINGS: Cardiomediastinal silhouette unchanged in size and contour. No evidence of central vascular congestion. No interlobular septal thickening. Similar appearance of low lung volumes with asymmetric elevation the right hemidiaphragm. Reticular pattern of opacities of the lungs, similar to the comparison. No pneumothorax or pleural effusion. Coarsened interstitial markings, with no confluent airspace disease. No acute displaced fracture. Surgical changes of the left shoulder, incompletely imaged. Surgical changes of the cervical region incompletely imaged. Surgical clips of the lower right neck. IMPRESSION: Low lung volumes and mild reticular opacities, potentially chronic changes/fibrosis versus sequela of viral infection. Electronically Signed   By: Corrie Mckusick D.O.   On: 11/19/2021 09:18     Scheduled Meds:  vitamin C  500 mg Oral Daily   calcium-vitamin D  1 tablet Oral Q breakfast   cholecalciferol  1,000 Units Oral Daily   enoxaparin (LOVENOX) injection  40 mg Subcutaneous Q24H   feeding supplement  1 Container Oral TID BM   fenofibrate  160 mg Oral Daily   FLUoxetine  20 mg Oral Daily   fluticasone  1 spray Each Nare Daily    levothyroxine  125 mcg Oral Q0600   loratadine  10 mg Oral Daily   meclizine  12.5 mg Oral TID   melatonin  3 mg Oral QHS   pantoprazole sodium  40 mg Oral Daily   potassium & sodium phosphates  1 packet Oral TID AC & HS   pravastatin  40 mg Oral QHS   propranolol  20 mg Oral BID   sennosides  5 mL Oral BID   zinc sulfate  220 mg Oral Daily   Continuous Infusions:  0.9 % NaCl with KCl 20 mEq / L 50 mL/hr at 11/20/21 0853   promethazine (PHENERGAN) injection (IM or IVPB) 6.25 mg (11/17/21 1623)     LOS: 14 days  Thank you for this consult. TRH will follow tomorrow.  Time spent: 40 minutes   Kayleen Memos, DO [facility]  If 7PM-7AM, please contact night-coverage [email or web address] 11/20/2021, 2:57 PM

## 2021-11-20 NOTE — Plan of Care (Signed)
  Problem: Clinical Measurements: Goal: Ability to maintain clinical measurements within normal limits will improve Outcome: Progressing Goal: Diagnostic test results will improve Outcome: Progressing Goal: Respiratory complications will improve Outcome: Progressing   

## 2021-11-20 NOTE — Progress Notes (Signed)
     Subjective:  Patient reports doing well this morning. Made excellent progress with PT yesterday. Ambulated 28 feet. Pain well controlled. No SOB, chest pain, fevers, or chills. Per social work dc to SNF as early as tomorrow, 12/14, due to Covid+.  Objective:   VITALS:   Vitals:   11/19/21 0603 11/19/21 1224 11/19/21 2220 11/20/21 0602  BP: (!) 143/62 135/62 (!) 120/52 (!) 109/59  Pulse: 75 79 84 78  Resp: 20 16 16 17   Temp: 97.7 F (36.5 C) 98.6 F (37 C) 98.2 F (36.8 C) 98.9 F (37.2 C)  TempSrc: Oral  Oral Oral  SpO2: 93% 94% 91% 92%  Weight:      Height:       Tolerates gentle range of motion of the hip including rotation and abduction  and flexion without discomfort. Intact ankle dorsiflexion plantarflexion. Redness to buttock appears resolved at this point  Lab Results  Component Value Date   WBC 7.9 11/17/2021   HGB 10.8 (L) 11/17/2021   HCT 31.9 (L) 11/17/2021   MCV 91.4 11/17/2021   PLT 289 11/17/2021   BMET    Component Value Date/Time   NA 136 11/20/2021 0328   NA 142 02/19/2016 0000   K 3.9 11/20/2021 0328   CL 106 11/20/2021 0328   CO2 24 11/20/2021 0328   GLUCOSE 107 (H) 11/20/2021 0328   BUN <5 (L) 11/20/2021 0328   BUN 13 02/19/2016 0000   CREATININE 0.41 (L) 11/20/2021 0328   CREATININE 0.56 (L) 11/10/2019 0931   CALCIUM 7.4 (L) 11/20/2021 0328   GFRNONAA >60 11/20/2021 0328     Assessment/Plan:  L THA performed 1995 with closed hip dislocation 11/28 now with limited mobility and poor pain control Multimodal pain control: Tylenol, Toradol, Flexeril, tramadol, hydrocodone for breakthrough Weightbearing as tolerated posterior hip precautions - hip abduction brace applied 11/30 - Patient having sores/pain and irritation from hip abduction brace. Discussed risks/benefits of continued brace use and feel at this point risks of pressure sores and pain outweigh the benefits. Recommend instead just use of a pillow between legs when in bed and  continued posterior hip precautions. CT scan 12/1 shows well seated components, no fx, concentric hip reduction, no adverse features Pressure sores posterior sacrum and b/l buttocks - frequent turns, oob, and position changes during the day. Mepilex and barrier cream applied by nursing. CRP downtrending, WBC normal, afebrile - reassuring against infectious process COVID+ (12/4) : 12/7 patient endorsing some mild symptoms.  Paxlovid started 12/7 Continue PT/OT DVT Prophylaxis: lovenox while in hospital, no DVT prophylaxis anticipated post discharged. Discharge: Plan to discharge to SNF given patient's poor mobility and limited support at home.  Discharge planning at this time is delayed given patient's positive COVID test.       Tasha Moss 11/20/2021, 7:10 AM   Charlies Constable, MD  Contact information:   [phone] 7am-5pm epic message Dr. Zachery Dakins, or call office for patient follow up: [phone] After hours and holidays please check [email or web address] for group call information for [facility]

## 2021-11-20 NOTE — TOC Progression Note (Addendum)
Transition of Care [facility]) - Progression Note    Patient Details  Name: KENZLEE FISHBURN MRN: [id number] Date of Birth: [birth year]  Transition of Care [facility]) CM/SW Contact  Lennart Pall, LCSW Phone Number: 11/20/2021, 10:04 AM  Clinical Narrative:    Have alerted SNFs that pt will be day 11 post COVID + tomorrow.  Anticipate she will be medically ready for dc and PT continues to recommend SNF.  Await bed offers and will resubmit for ins auth.  ADDENDUM: Pt and family have accepted SNF bed offer from [facility] who can admit pt tomorrow.  Have received insurance auth as well [facility] # [id number]).  MD aware need for summary tomorrow.   Expected Discharge Plan: [place] Barriers to Discharge: Continued Medical Work up, Ship broker, SNF Pending bed offer  Expected Discharge Plan and Services Expected Discharge Plan: [place] In-house Referral: Clinical Social Work   Post Acute Care Choice: [place] Living arrangements for the past 2 months: Single Family Home                                       Social Determinants of Health (SDOH) Interventions    Readmission Risk Interventions No flowsheet data found.

## 2021-11-20 NOTE — Plan of Care (Signed)
Plan of care reviewed and discussed with the patient. 

## 2021-11-21 ENCOUNTER — Inpatient Hospital Stay
Admission: RE | Admit: 2021-11-21 | Discharge: 2021-12-03 | Disposition: A | Discharge status: Home / Self Care | Payer: Medicare Other | Source: Ambulatory Visit | Attending: Internal Medicine | Admitting: Internal Medicine

## 2021-11-21 ENCOUNTER — Telehealth: Payer: Self-pay | Admitting: Family Medicine

## 2021-11-21 DIAGNOSIS — I10 Essential (primary) hypertension: Secondary | ICD-10-CM | POA: Diagnosis not present

## 2021-11-21 DIAGNOSIS — E440 Moderate protein-calorie malnutrition: Secondary | ICD-10-CM | POA: Diagnosis not present

## 2021-11-21 DIAGNOSIS — S73012D Posterior subluxation of left hip, subsequent encounter: Secondary | ICD-10-CM | POA: Diagnosis not present

## 2021-11-21 DIAGNOSIS — Z741 Need for assistance with personal care: Secondary | ICD-10-CM | POA: Diagnosis not present

## 2021-11-21 DIAGNOSIS — J309 Allergic rhinitis, unspecified: Secondary | ICD-10-CM | POA: Diagnosis not present

## 2021-11-21 DIAGNOSIS — F32A Depression, unspecified: Secondary | ICD-10-CM | POA: Diagnosis not present

## 2021-11-21 DIAGNOSIS — T84021D Dislocation of internal left hip prosthesis, subsequent encounter: Secondary | ICD-10-CM | POA: Diagnosis not present

## 2021-11-21 DIAGNOSIS — Z20828 Contact with and (suspected) exposure to other viral communicable diseases: Secondary | ICD-10-CM | POA: Diagnosis not present

## 2021-11-21 DIAGNOSIS — H8113 Benign paroxysmal vertigo, bilateral: Secondary | ICD-10-CM | POA: Diagnosis not present

## 2021-11-21 DIAGNOSIS — R2681 Unsteadiness on feet: Secondary | ICD-10-CM | POA: Diagnosis not present

## 2021-11-21 DIAGNOSIS — E890 Postprocedural hypothyroidism: Secondary | ICD-10-CM | POA: Diagnosis not present

## 2021-11-21 DIAGNOSIS — M6281 Muscle weakness (generalized): Secondary | ICD-10-CM | POA: Diagnosis not present

## 2021-11-21 DIAGNOSIS — K219 Gastro-esophageal reflux disease without esophagitis: Secondary | ICD-10-CM | POA: Diagnosis not present

## 2021-11-21 DIAGNOSIS — M2550 Pain in unspecified joint: Secondary | ICD-10-CM | POA: Diagnosis not present

## 2021-11-21 DIAGNOSIS — R262 Difficulty in walking, not elsewhere classified: Secondary | ICD-10-CM | POA: Diagnosis not present

## 2021-11-21 DIAGNOSIS — E782 Mixed hyperlipidemia: Secondary | ICD-10-CM | POA: Diagnosis not present

## 2021-11-21 DIAGNOSIS — Z961 Presence of intraocular lens: Secondary | ICD-10-CM | POA: Diagnosis not present

## 2021-11-21 DIAGNOSIS — J3089 Other allergic rhinitis: Secondary | ICD-10-CM | POA: Diagnosis not present

## 2021-11-21 DIAGNOSIS — R42 Dizziness and giddiness: Secondary | ICD-10-CM | POA: Diagnosis not present

## 2021-11-21 DIAGNOSIS — S73005A Unspecified dislocation of left hip, initial encounter: Secondary | ICD-10-CM | POA: Diagnosis not present

## 2021-11-21 DIAGNOSIS — Z96643 Presence of artificial hip joint, bilateral: Secondary | ICD-10-CM | POA: Diagnosis not present

## 2021-11-21 DIAGNOSIS — R1312 Dysphagia, oropharyngeal phase: Secondary | ICD-10-CM | POA: Diagnosis not present

## 2021-11-21 DIAGNOSIS — Z96612 Presence of left artificial shoulder joint: Secondary | ICD-10-CM | POA: Diagnosis not present

## 2021-11-21 DIAGNOSIS — E063 Autoimmune thyroiditis: Secondary | ICD-10-CM | POA: Diagnosis not present

## 2021-11-21 DIAGNOSIS — R2689 Other abnormalities of gait and mobility: Secondary | ICD-10-CM | POA: Diagnosis not present

## 2021-11-21 DIAGNOSIS — Z7401 Bed confinement status: Secondary | ICD-10-CM | POA: Diagnosis not present

## 2021-11-21 DIAGNOSIS — Z8616 Personal history of COVID-19: Secondary | ICD-10-CM | POA: Diagnosis not present

## 2021-11-21 DIAGNOSIS — E785 Hyperlipidemia, unspecified: Secondary | ICD-10-CM | POA: Diagnosis not present

## 2021-11-21 DIAGNOSIS — M159 Polyosteoarthritis, unspecified: Secondary | ICD-10-CM | POA: Diagnosis not present

## 2021-11-21 LAB — PHOSPHORUS: Phosphorus: 2.9 mg/dL (ref 2.5–4.6)

## 2021-11-21 MED ORDER — DICLOFENAC SODIUM 1 % EX GEL: 2.0000 g | Freq: Four times a day (QID) | CUTANEOUS | Status: DC | PRN

## 2021-11-21 NOTE — Progress Notes (Signed)
Report given to Olegario Shearer, Therapist, sports at [facility]. Awaiting PTAR. Daughter at the bedside. Will continue to monitor.

## 2021-11-21 NOTE — Telephone Encounter (Signed)
Spoke to [place], made her aware of immunization status

## 2021-11-21 NOTE — Progress Notes (Signed)
° ° ° °  Subjective:  Patient reports doing well this morning. Pain well controlled now. Working well with PT. Plan to go to SNF today to continue to rehab before going home. No concerns.  Objective:   VITALS:   Vitals:   11/20/21 1034 11/20/21 1426 11/20/21 2321 11/21/21 0334  BP: (!) 144/62 (!) 131/50 (!) 129/57 (!) 134/52  Pulse: 84 87 75 83  Resp: (!) 22 19 18 16   Temp: 98.6 F (37 C) 98.4 F (36.9 C) 98.8 F (37.1 C) 98.8 F (37.1 C)  TempSrc: Oral Oral Oral Oral  SpO2: 96% 95% 91% 93%  Weight:      Height:       Tolerates gentle range of motion of the hip including rotation and abduction  and flexion without discomfort. Intact ankle dorsiflexion plantarflexion. Redness to buttock appears resolved at this point  Lab Results  Component Value Date   WBC 7.9 11/17/2021   HGB 10.8 (L) 11/17/2021   HCT 31.9 (L) 11/17/2021   MCV 91.4 11/17/2021   PLT 289 11/17/2021   BMET    Component Value Date/Time   NA 136 11/20/2021 0328   NA 142 02/19/2016 0000   K 3.9 11/20/2021 0328   CL 106 11/20/2021 0328   CO2 24 11/20/2021 0328   GLUCOSE 107 (H) 11/20/2021 0328   BUN <5 (L) 11/20/2021 0328   BUN 13 02/19/2016 0000   CREATININE 0.41 (L) 11/20/2021 0328   CREATININE 0.56 (L) 11/10/2019 0931   CALCIUM 7.4 (L) 11/20/2021 0328   GFRNONAA >60 11/20/2021 0328     Assessment/Plan:  L THA performed 1995 with closed hip dislocation 11/28 now with limited mobility and poor pain control Multimodal pain control: Tylenol, Toradol, Flexeril, tramadol, hydrocodone for breakthrough Weightbearing as tolerated posterior hip precautions - hip abduction brace applied 11/30 - Patient having sores/pain and irritation from hip abduction brace. Discussed risks/benefits of continued brace use and feel at this point risks of pressure sores and pain outweigh the benefits. Recommend instead just use of a pillow between legs when in bed and continued posterior hip precautions. CT scan 12/1 shows  well seated components, no fx, concentric hip reduction, no adverse features Pressure sores posterior sacrum and b/l buttocks - frequent turns, oob, and position changes during the day. Mepilex and barrier cream applied by nursing. CRP downtrending, WBC normal, afebrile - reassuring against infectious process COVID+ (12/4) : 12/7 patient endorsing some mild symptoms.  Paxlovid started 12/7 Continue PT/OT DVT Prophylaxis: lovenox while in hospital, no DVT prophylaxis anticipated post discharged. Discharge: Plan to discharge to SNF given patient's poor mobility and limited support at home.  Discharge planning at this time is delayed given patient's positive COVID test.       Hillarie Harrigan A Aneesah Hernan 11/21/2021, 8:28 AM   Charlies Constable, MD  Contact information:   [phone] 7am-5pm epic message Dr. Zachery Dakins, or call office for patient follow up: [phone] After hours and holidays please check [email or web address] for group call information for [facility]

## 2021-11-21 NOTE — TOC Transition Note (Signed)
Transition of Care [facility]) - CM/SW Discharge Note   Patient Details  Name: Tasha Moss MRN: [id number] Date of Birth: 11/14/1936  Transition of Care [facility]) CM/SW Contact:  Lennart Pall, LCSW Phone Number: 11/21/2021, 9:55 AM   Clinical Narrative:     Pt medically cleared for dc today to SNF.  Bed accepted at [facility] and insurance auth received.  PTAR called at 9:40am.  RN to call report to [phone].  No further TOC needs.  Final next level of care: Skilled Nursing Facility Barriers to Discharge: Barriers Resolved   Patient Goals and CMS Choice Patient states their goals for this hospitalization and ongoing recovery are:: to be able to mobilize at home      Discharge Placement              Patient chooses bed at: [facility] Patient to be transferred to facility by: [place] Name of family member notified: daughter, [name] Patient and family notified of of transfer: 11/21/21  Discharge Plan and Services In-house Referral: Clinical Social Work   Post Acute Care Choice: [place]          DME Arranged: N/A DME Agency: NA                  Social Determinants of Health ([place]) Interventions     Readmission Risk Interventions No flowsheet data found.

## 2021-11-21 NOTE — Telephone Encounter (Signed)
Tammy from [facility] called in asking if pt has a tetanus, shingles, and pneumonia vac.   Please call and make Tammy aware, pt is being admitted today.  Ok to leave a detailed message if no answer.

## 2021-11-21 NOTE — Discharge Summary (Signed)
Physician Discharge Summary  Patient ID: Tasha Moss MRN: [id number] DOB/AGE: November 02, 1936 85 y.o.  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 female who was admitted 11/06/2021 with a diagnosis of closed left hip dislocation status post reduction with severe deconditioning and inability to be taken care of at home. She was managed conservatively following closed reduction. She gradually progress with PT and pain control. She also contracted COVID. She has since recovered and is doing well but still has significant weakness and debility. She was discharged to SNF on 11/21/21.  She was given perioperative antibiotics:  Anti-infectives (From admission, onward)    Start     Dose/Rate Route Frequency Ordered Stop   11/14/21 1215  nirmatrelvir/ritonavir EUA (PAXLOVID) 3 tablet        3 tablet Oral 2 times daily 11/14/21 1115 11/18/21 2242     .  She was given sequential compression devices, early ambulation, and lovenox for DVT prophylaxis.  She benefited maximally from the hospital stay and there were no complications.    Recent vital signs:  Vitals:   11/20/21 2321 11/21/21 0334  BP: (!) 129/57 (!) 134/52  Pulse: 75 83  Resp: 18 16   Temp: 98.8 F (37.1 C) 98.8 F (37.1 C)  SpO2: 91% 93%    Recent laboratory studies:  Lab Results  Component Value Date   HGB 10.8 (L) 11/17/2021   HGB 10.9 (L) 11/14/2021   HGB 10.3 (L) 11/11/2021   Lab Results  Component Value Date   WBC 7.9 11/17/2021   PLT 289 11/17/2021   Lab Results  Component Value Date   INR 1.11 02/19/2016   Lab Results  Component Value Date   NA 136 11/20/2021   K 3.9 11/20/2021   CL 106 11/20/2021   CO2 24 11/20/2021   BUN <5 (L) 11/20/2021   CREATININE 0.41 (L) 11/20/2021   GLUCOSE 107 (H) 11/20/2021    Discharge Medications:   Allergies as of 11/21/2021       Reactions   Gabapentin    hallucinations   Other    Strong pain medications cause n/v   Morphine    Nausea & vomiting        Medication List     STOP taking these medications    oxyCODONE-acetaminophen 5-325 MG tablet Commonly known as: PERCOCET/ROXICET       TAKE these medications    acetaminophen 500 MG tablet Commonly known as: TYLENOL Take 500 mg by mouth every 6 (six) hours as needed.   ALL CLEAR EYE DROPS OP Apply 2 drops to eye daily.   chlorhexidine 0.12 % solution Commonly known as: PERIDEX  Use as directed 15 mLs in the mouth or throat 2 (two) times daily.   Cranberry 200 MG Caps Take 1 capsule by mouth daily.   diclofenac Sodium 1 % Gel Commonly known as: VOLTAREN Apply 2 g topically 4 (four) times daily as needed (Pain).   fenofibrate 160 MG tablet Take 1 tablet by mouth once daily   FLUoxetine 20 MG capsule Commonly known as: PROZAC Take 1 capsule by mouth once daily   fluticasone 50 MCG/ACT nasal spray Commonly known as: FLONASE Place 1 spray into both nostrils daily.   glucosamine-chondroitin 500-400 MG tablet Take 1 tablet by mouth every other day.   levothyroxine 125 MCG tablet Commonly known as: SYNTHROID TAKE 1 TABLET BY MOUTH ONCE DAILY BEFORE BREAKFAST   loratadine 10 MG tablet Commonly known as: CLARITIN Take 10  mg by mouth daily.   meloxicam 7.5 MG tablet Commonly known as: MOBIC Take 1 tablet by mouth twice daily   multivitamin tablet Take 1 tablet by mouth daily.   pantoprazole 40 MG tablet Commonly known as: PROTONIX Take 1 tablet by mouth once daily   pravastatin 40 MG tablet Commonly known as: PRAVACHOL TAKE 1 TABLET BY MOUTH ONCE DAILY AT BEDTIME FOR CHOLESTEROL What changed: See the new instructions.   propranolol ER 60 MG 24 hr capsule Commonly known as: INDERAL LA Take 1 capsule (60 mg total) by mouth daily.   vitamin B-12 250 MCG tablet Commonly known as: CYANOCOBALAMIN Take 250 mcg by mouth daily.   Vitamin D3 25 MCG (1000 UT) Caps Take 1,000 Units by mouth daily.        Diagnostic Studies: DG Pelvis Portable  Result Date: 11/05/2021 CLINICAL DATA:  Post reduction left hip. EXAM: PORTABLE PELVIS 1-2 VIEWS COMPARISON:  Left hip x-ray 11/05/2021. FINDINGS: Left hip arthroplasty is now in anatomic alignment. There is no evidence for hardware loosening or acute fracture. Right hip arthroplasty is also in anatomic alignment. Degenerative changes affect the sacroiliac joints and pubic symphysis. IMPRESSION: 1. Left hip arthroplasty is in anatomic alignment. 2. No acute fracture. Electronically Signed   By: Ronney Asters M.D.   On: 11/05/2021 15:02   CT HIP LEFT WO CONTRAST  Result Date: 11/09/2021 CLINICAL DATA:  Left hip dislocation postreduction. EXAM: CT OF THE LEFT HIP WITHOUT CONTRAST TECHNIQUE: Multidetector CT imaging of the left hip was performed according to the standard protocol. Multiplanar CT image reconstructions were also generated. COMPARISON:  Hip radiographs 11/05/2021.  Pelvic CT 07/16/2010. FINDINGS: Bones/Joint/Cartilage Status post left total hip arthroplasty. The dislocated arthroplasty has been reduced. There is no evidence of acute fracture or hardware loosening. There is chronic remodeling of the proximal femoral shaft at the distal end of the femoral  prosthesis. No significant hip joint effusion allowing for beam hardening artifact associated with the hardware. There are moderately advanced degenerative changes at the symphysis pubis, left sacroiliac joint and L5-S1 disc space. Ligaments Suboptimally assessed by CT. Muscles and Tendons Mild generalized muscular atrophy. No focal abnormality apparent. Mild common hamstring tendinosis on the left. Soft tissues Mild generalized nonspecific subcutaneous edema. No focal fluid collection, unexpected foreign body or soft tissue emphysema. IMPRESSION: 1. Successful reduction of the dislocated left hip arthroplasty. 2. No evidence of acute fracture or hardware loosening. 3. No evidence of focal periarticular hematoma or other fluid collection. Electronically Signed   By: Richardean Sale M.D.   On: 11/09/2021 08:22   DG CHEST PORT 1 VIEW  Result Date: 11/19/2021 CLINICAL DATA:  85 year old female  with a history of COVID EXAM: PORTABLE CHEST 1 VIEW COMPARISON:  03/15/2015 FINDINGS: Cardiomediastinal silhouette unchanged in size and contour. No evidence of central vascular congestion. No interlobular septal thickening. Similar appearance of low lung volumes with asymmetric elevation the right hemidiaphragm. Reticular pattern of opacities of the lungs, similar to the comparison. No pneumothorax or pleural effusion. Coarsened interstitial markings, with no confluent airspace disease. No acute displaced fracture. Surgical changes of the left shoulder, incompletely imaged. Surgical changes of the cervical region incompletely imaged. Surgical clips of the lower right neck. IMPRESSION: Low lung volumes and mild reticular opacities, potentially chronic changes/fibrosis versus sequela of viral infection. Electronically Signed   By: Corrie Mckusick D.O.   On: 11/19/2021 09:18   DG Hip Unilat With Pelvis 2-3 Views Left  Result Date: 11/05/2021 CLINICAL DATA:  Left hip pain after bending over. EXAM: DG HIP (WITH OR WITHOUT  PELVIS) 2-3V LEFT COMPARISON:  CT of the abdomen and pelvis 07/16/2010 FINDINGS: Bilateral total hip arthroplasty again noted. The left the hip is dislocated superiorly and laterally. No associated fracture is present. Degenerative changes are present in the lower lumbar spine. IMPRESSION: Superior and lateral dislocation of the left hip. Electronically Signed   By: San Morelle M.D.   On: 11/05/2021 13:39    Disposition: Discharge disposition: 03-Skilled Nursing Facility       Discharge Instructions     Call MD / Call 911   Complete by: As directed    If you experience chest pain or shortness of breath, CALL 911 and be transported to the hospital emergency room.  If you develope a fever above 101 F, pus (white drainage) or increased drainage or redness at the wound, or calf pain, call your surgeon's office.   Constipation Prevention   Complete by: As directed    Drink plenty of fluids.  Prune juice may be helpful.  You may use a stool softener, such as Colace (over the counter) 100 mg twice a day.  Use MiraLax (over the counter) for constipation as needed.   Diet - low sodium heart healthy   Complete by: As directed    Increase activity slowly as tolerated   Complete by: As directed         Contact information for after-discharge care     [facility] .   Service: Skilled Nursing Contact information: [address] [place] [phone]                      Signed: [place] 11/21/2021, 8:57 AM

## 2021-11-22 ENCOUNTER — Non-Acute Institutional Stay (SKILLED_NURSING_FACILITY): Payer: Medicare Other | Admitting: Adult Health

## 2021-11-22 ENCOUNTER — Encounter: Payer: Self-pay | Admitting: Adult Health

## 2021-11-22 DIAGNOSIS — E063 Autoimmune thyroiditis: Secondary | ICD-10-CM | POA: Diagnosis not present

## 2021-11-22 DIAGNOSIS — H8113 Benign paroxysmal vertigo, bilateral: Secondary | ICD-10-CM | POA: Diagnosis not present

## 2021-11-22 DIAGNOSIS — M159 Polyosteoarthritis, unspecified: Secondary | ICD-10-CM

## 2021-11-22 DIAGNOSIS — E782 Mixed hyperlipidemia: Secondary | ICD-10-CM | POA: Diagnosis not present

## 2021-11-22 DIAGNOSIS — Z20828 Contact with and (suspected) exposure to other viral communicable diseases: Secondary | ICD-10-CM

## 2021-11-22 DIAGNOSIS — J3089 Other allergic rhinitis: Secondary | ICD-10-CM

## 2021-11-22 DIAGNOSIS — K219 Gastro-esophageal reflux disease without esophagitis: Secondary | ICD-10-CM | POA: Diagnosis not present

## 2021-11-22 DIAGNOSIS — T84021D Dislocation of internal left hip prosthesis, subsequent encounter: Secondary | ICD-10-CM | POA: Diagnosis not present

## 2021-11-22 DIAGNOSIS — F418 Other specified anxiety disorders: Secondary | ICD-10-CM

## 2021-11-22 DIAGNOSIS — M150 Primary generalized (osteo)arthritis: Secondary | ICD-10-CM

## 2021-11-22 NOTE — Progress Notes (Signed)
Location:  [place] Room Number: 159-P Place of Service:  SNF (31) Provider: Ok Edwards, NP  CODE STATUS: FULL CODE  Allergies  Allergen Reactions   Gabapentin     hallucinations   Other     Strong pain medications cause n/v   Morphine     Nausea & vomiting    Chief Complaint  Patient presents with   Hospitalization Follow-up    Hospital Follow Up.    HPI:  She is a 85 year old who has been hospitalized from 11-06-21 through 11-21-21. Her medical history includes: hyperlipidemia; depression hypothyroidism. She had a left hip dislocation and presented to the ED. She had a closed reduction performed. She had contracted COVID and has completed treatment. She has since recovered. She remains weak with debility. She is here for short term with her goal to return back home. She has a history of vertigo; is having significant nausea vomiting and dizziness. She will continue to be followed for her chronic illnesses including:    Benign paroxsymal positional vertigo due to bilateral vestibular disorder:    Hashimoto's thyroiditis:    Hyperlipidemia:    Past Medical History:  Diagnosis Date   Allergy    seasonal & dust   Anxiety    Arthritis    DJD   Family history of adverse reaction to anesthesia    n/v    GERD (gastroesophageal reflux disease)    History of vertigo    Hyperlipidemia    Hypertension    Hypothyroidism    PONV (postoperative nausea and vomiting)    Thyroid disease    hypothyroidism post Hashimoto's & partial thyroidectomy    Past Surgical History:  Procedure Laterality Date   CATARACT EXTRACTION W/ INTRAOCULAR LENS  IMPLANT, BILATERAL     COLONOSCOPY     diverticulosis   JOINT REPLACEMENT  1995 & 1999   THR bilaterally    lumbar disc repair  1989   Dr Arrie Aran   SPINE SURGERY     cervical ; Dr Vertell Limber   THYROIDECTOMY, PARTIAL     benign nodule   TONSILLECTOMY AND ADENOIDECTOMY     TOTAL SHOULDER ARTHROPLASTY Left  02/27/2016   Procedure: LEFT REVERSE TOTAL SHOULDER ARTHROPLASTY;  Surgeon: Renette Butters, MD;  Location: [place];  Service: Orthopedics;  Laterality: Left;   vitreous detachment     Dr Claudean Kinds    Social History   Socioeconomic History   Marital status: Married    Spouse name: Not on file   Number of children: 2   Years of education: Not on file   Highest education level: Not on file  Occupational History   Not on file  Tobacco Use   Smoking status: Never   Smokeless tobacco: Never  Vaping Use   Vaping Use: Never used  Substance and Sexual Activity   Alcohol use: No   Drug use: No   Sexual activity: Not on file  Other Topics Concern   Not on file  Social History Narrative   Married for 61 years    Daughter-nurse   Daughter- paralegal       Right handed   Social Determinants of Health   Financial Resource Strain: Low Risk    Difficulty of Paying Living Expenses: Not hard at all  Food Insecurity: Not on file  Transportation Needs: Not on file  Physical Activity: Inactive   Days of Exercise per Week: 0 days   Minutes of Exercise per Session:  0 min  Stress: No Stress Concern Present   Feeling of Stress : Not at all  Social Connections: Socially Integrated   Frequency of Communication with Friends and Family: More than three times a week   Frequency of Social Gatherings with Friends and Family: Once a week   Attends Religious Services: More than 4 times per year   Active Member of [employer] or Organizations: Yes   Attends Music therapist: 1 to 4 times per year   Marital Status: Married  Human resources officer Violence: Not At Risk   Fear of Current or Ex-Partner: No   Emotionally Abused: No   Physically Abused: No   Sexually Abused: No   Family History  Problem Relation Age of Onset   Diabetes Mother    Hepatitis Mother        Hepatitis B,? etiology   Miscarriages / Stillbirths Mother    Stroke Mother 68   Heart disease Father        CHF   Cancer  Paternal Aunt        GI cancers   Heart disease Paternal Aunt        CHF   Cancer Paternal Uncle        lung, GI   Heart disease Paternal Uncle        CHF      VITAL SIGNS BP 120/64    Pulse 86    Temp 98.2 F (36.8 C)    Resp 20    Ht 5\' 5"  (1.651 m)    SpO2 92%    BMI 23.81 kg/m   Outpatient Encounter Medications as of 11/22/2021  Medication Sig   acetaminophen (TYLENOL) 500 MG tablet Take 500 mg by mouth every 6 (six) hours as needed.   chlorhexidine (PERIDEX) 0.12 % solution Use as directed 15 mLs in the mouth or throat 2 (two) times daily.   Cholecalciferol (VITAMIN D3) 1000 UNITS CAPS Take 1,000 Units by mouth daily.   Cranberry 200 MG CAPS Take 1 capsule by mouth daily.   diclofenac Sodium (VOLTAREN) 1 % GEL Apply 2 g topically 4 (four) times daily as needed (Pain).   fenofibrate 160 MG tablet Take 1 tablet by mouth once daily   FLUoxetine (PROZAC) 20 MG capsule Take 1 capsule by mouth once daily   fluticasone (FLONASE) 50 MCG/ACT nasal spray Place 1 spray into both nostrils daily.   glucosamine-chondroitin 500-400 MG tablet Take 1 tablet by mouth every other day.   levothyroxine (SYNTHROID) 125 MCG tablet TAKE 1 TABLET BY MOUTH ONCE DAILY BEFORE BREAKFAST   loratadine (CLARITIN) 10 MG tablet Take 10 mg by mouth daily.   meclizine (ANTIVERT) 25 MG tablet Take 25 mg by mouth every 6 (six) hours.   meloxicam (MOBIC) 7.5 MG tablet Take 1 tablet by mouth twice daily   Multiple Vitamin (MULTIVITAMIN PO) Take 400 mg by mouth daily. Therems Multivitamin   ondansetron (ZOFRAN) 8 MG tablet Take by mouth every 8 (eight) hours as needed for nausea or vomiting.   pantoprazole (PROTONIX) 40 MG tablet Take 1 tablet by mouth once daily   Polyvinyl Alcohol-Povidone (ARTIFICIAL TEARS) 5-6 MG/ML SOLN Place 2 drops into both eyes daily.   pravastatin (PRAVACHOL) 40 MG tablet TAKE 1 TABLET BY MOUTH ONCE DAILY AT BEDTIME FOR CHOLESTEROL   vitamin B-12 (CYANOCOBALAMIN) 250 MCG tablet Take 250  mcg by mouth daily.   [DISCONTINUED] Multiple Vitamin (MULTIVITAMIN) tablet Take 1 tablet by mouth daily.   [DISCONTINUED] Naphazoline-Polyethyl Glycol (  ALL CLEAR EYE DROPS OP) Apply 2 drops to eye daily.   [DISCONTINUED] propranolol ER (INDERAL LA) 60 MG 24 hr capsule Take 1 capsule (60 mg total) by mouth daily.   No facility-administered encounter medications on file as of 11/22/2021.     SIGNIFICANT DIAGNOSTIC EXAMS  TODAY  11-08-21: ct of left hip 1. Successful reduction of the dislocated left hip arthroplasty. 2. No evidence of acute fracture or hardware loosening. 3. No evidence of focal periarticular hematoma or other fluid collection.  11-19-21: chest x-ray:Low lung volumes and mild reticular opacities, potentially chronic changes/fibrosis versus sequela of viral infection.  LABS REVIEWED TODAY  11-07-21: wbc 6.9 hgb 10.3; hct 30.4; mcv 91.6 plt 187; glucose 109; bun 16; creat  0.65; k+ 2.9; na++ 138; ca 8.5; GFR >60; CRP 4.9 11-11-21: wbc 6.3; hgb 10.3; hct 29.1; mcv 89.0 plt 203; glucose 98; bun 9; creat 0.40; k+ 2.7; na++ 136 ca 7.6; GFR >60 11-14-21: wbc 5.4; hgb 10.9; hct 31.2 mcv 90.2 plt 214; glucose 119; bun 8; creat 040; k+ 3.2; na++ 136; ca 8.1; GFR>60 11-15-21: glucose 101; bun 8; creat 0.48; k+ 3.5; na++ 138; ca 8.1; GFR>60 11-16-21: glucose 83; bun 9; creat 0.65; k+ 3.9; na++ 137; ca 9.0 GFR>60  Review of Systems  Constitutional:  Negative for malaise/fatigue.  Respiratory:  Negative for cough and shortness of breath.   Cardiovascular:  Negative for chest pain, palpitations and leg swelling.  Gastrointestinal:  Positive for nausea and vomiting. Negative for abdominal pain, constipation and heartburn.  Musculoskeletal:  Negative for back pain, joint pain and myalgias.  Skin: Negative.   Neurological:  Positive for dizziness.  Psychiatric/Behavioral:  The patient is not nervous/anxious.    Physical Exam Constitutional:      General: She is not in acute  distress.    Appearance: She is well-developed. She is not diaphoretic.  Neck:     Thyroid: No thyromegaly.  Cardiovascular:     Rate and Rhythm: Normal rate and regular rhythm.     Pulses: Normal pulses.     Heart sounds: Normal heart sounds.  Pulmonary:     Effort: Pulmonary effort is normal. No respiratory distress.     Breath sounds: Normal breath sounds.  Abdominal:     General: Bowel sounds are normal. There is no distension.     Palpations: Abdomen is soft.     Tenderness: There is no abdominal tenderness.  Musculoskeletal:     Cervical back: Neck supple.     Right lower leg: No edema.     Left lower leg: No edema.     Comments: Is able to move all extremities  Lymphadenopathy:     Cervical: No cervical adenopathy.  Skin:    General: Skin is warm and dry.  Neurological:     Mental Status: She is alert and oriented to person, place, and time.  Psychiatric:        Mood and Affect: Mood normal.     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  Benign paroxsymal positional vertigo due to bilateral vestibular disorder: is worse; will begin will begin meclizine 25 mg every 6 hours; and will begin zofran 8 mg every 8 hours as needed for nausea or vomiting.  3. Hashimoto's thyroiditis: is stable will continue synthroid 125 mcg daily   4.  SARS associated coronavirus exposure: has completes antiviral treatment; is stable will monitor   5. Hyperlipidemia: is stable will continue fenofibrate 160 mg daily and pravachol 40 mg daily   6.  Depression with anxiety: is stable will continue prozac 20 mg daily   7. GERD without esophagitis: is stable will continue protonix 40 mg daily   8. Non seasonal allergic rhinitis  unspecified trigger: is stable will continue claritin 10 mg daily; flonase daily   9. Hypomagnesemia: is without change will check level  Will check BMP    Ok Edwards NP [facility]  call [phone]

## 2021-11-23 ENCOUNTER — Encounter: Payer: Self-pay | Admitting: Internal Medicine

## 2021-11-23 ENCOUNTER — Non-Acute Institutional Stay (SKILLED_NURSING_FACILITY): Payer: Medicare Other | Admitting: Internal Medicine

## 2021-11-23 DIAGNOSIS — R42 Dizziness and giddiness: Secondary | ICD-10-CM

## 2021-11-23 DIAGNOSIS — E440 Moderate protein-calorie malnutrition: Secondary | ICD-10-CM | POA: Diagnosis not present

## 2021-11-23 DIAGNOSIS — Z20828 Contact with and (suspected) exposure to other viral communicable diseases: Secondary | ICD-10-CM | POA: Diagnosis not present

## 2021-11-23 DIAGNOSIS — T84021D Dislocation of internal left hip prosthesis, subsequent encounter: Secondary | ICD-10-CM

## 2021-11-23 NOTE — Progress Notes (Signed)
NURSING HOME LOCATION:  [facility] ROOM NUMBER:  [id number]  CODE STATUS:  Full Code   PCP:  Annye Asa MD  This is a comprehensive admission note to this SNFperformed on this date less than 30 days from date of admission. Included are preadmission medical/surgical history; reconciled medication list; family history; social history and comprehensive review of systems.  Corrections and additions to the records were documented. Comprehensive physical exam was also performed. Additionally a clinical summary was entered for each active diagnosis pertinent to this admission in the Problem List to enhance continuity of care.  HPI: She was hospitalized 11/29 - 11/21/2021 for failure of left THA with dislocation of the hip.  She states she was sitting in the dining room chair decorating for Christmas and reached for an ornament when she had severe pain in the hip and "I knew my hip had popped out".  She described it as "awfulest pain".  She was immobile following the dislocation; her daughter, a Nurse had to lift her and aid her in all ADLs. The dislocation was reduced but severe deconditioning and inability to provide self-care prompted PT/OT to recommend SNF for rehab. Hospital course was complicated by COVID infection for which she received Paxlovid 12/7-11/2010.  D-dimer was less than 0.27 at discharge after peaking at 0.63.  C-reactive protein peaked at 5 ; it was 3.3 on 12/12.  Chest x-ray revealed low lung volumes with mild reticular opacities suggesting fibrotic versus infectious etiology.  She exhibited CKD stage II with GFR greater than 60.  Creatinine ranged from initial 0.64 to a value of 0.41 predischarge.  Protein/caloric malnutrition was present with albumin of 2.4 and total protein of 5.1.  Hypocalcemia was present with a nadir of 7.4.  She also exhibited profound hypokalemia with a nadir potassium of 2.7. She was not on HCTZ or other diuretic. It was 3.7 prior to  discharge.  She also had hypomagnesemia which was repleted with a final value of 1.7. At admission H/H was 11.9/34.4.  The nadir H/H was 10.3/29.1.  Predischarge anemia was stable with H/H of 10.8/31.9.  Past medical and surgical history: Includes GERD, dyslipidemia, essential hypertension, and hypothyroidism. Surgeries and procedures include colonoscopy, lumbar disc repair, partial thyroidectomy, and total shoulder arthroplasty.  Social history: non drinker ; non smoker  Family history: non contributory due to advanced age   Review of systems: Her major complaint was "I am so stirred up and sick".  She complains of severe vertigo even moving in the wheelchair.  This is been associated with nausea and vomiting as well as blurred vision.  She states she feels as if her head is "floating".   She actually describes issues with chronic dizziness and vertigo since childhood.  She said this has varied in intensity.  It has been treated with Antivert, Bonine, and Dramamine.  She is unsure as to any significant response to these.  She has been evaluated by ENT without definitive diagnosis or therapy. With the COVID she has had loss of taste.  She also has localized soreness in the posterior oropharynx described as "burning and stinging".  She has intermittent cough with scant sputum.  She describes some dysphagia.  She describes tingling in her feet when sitting with feet dependent.  Constitutional: No fever, significant weight change  Eyes: No redness, discharge, pain ENT/mouth: No nasal congestion, purulent discharge, earache Cardiovascular: No chest pain, palpitations, paroxysmal nocturnal dyspnea, claudication, edema  Respiratory: No hemoptysis, DOE, significant snoring, apnea  Gastrointestinal: No heartburn,abdominal pain, rectal bleeding, melena, change in bowels Genitourinary: No dysuria, hematuria, pyuria, incontinence, nocturia Dermatologic: No rash, pruritus, change in appearance of  skin Neurologic: No headache, syncope, seizures Psychiatric: No significant insomnia, anorexia Endocrine: No change in hair/skin/nails, excessive thirst, excessive hunger, excessive urination  Hematologic/lymphatic: No significant bruising, lymphadenopathy, abnormal bleeding Allergy/immunology: No itchy/watery eyes, significant sneezing, urticaria, angioedema  Physical exam:  Pertinent or positive findings: She is markedly hard of hearing which requires examiner to essentially yell.  She appears somewhat chronically ill.  Hair is disheveled.  Pallor is suggested.  Pupils are pinpoint.  Lacrimal gland is prominent on the left.  The uvula is bifid and exhibits mild erythema without frank cellulitis.  There is a slight increase in S1.  Slight rales are noted to the right lower lobe.  Bronchovesicular breath sounds are suggested diffusely.  She tibial pulses are stronger than dorsalis pedis pulses.  She has trace edema at the sock line.  She has mixed DIP/PIP arthritic change of the hands.  Interosseous wasting is present.  General appearance: no acute distress, increased work of breathing is present.   Lymphatic: No lymphadenopathy about the head, neck, axilla. Eyes: No conjunctival inflammation or lid edema is present. There is no scleral icterus. Ears:  External ear exam shows no significant lesions or deformities.   Nose:  External nasal examination shows no deformity or inflammation. Nasal mucosa are pink and moist without lesions, exudates Neck:  No thyromegaly, masses, tenderness noted.    Heart:  No gallop, murmur, click, rub.  Lungs: without wheezes, rhonchi, rubs. Abdomen: Bowel sounds are normal.  Abdomen is soft and nontender with no organomegaly, hernias, masses. GU: Deferred  Extremities:  No cyanosis, clubbing Neurologic exam: Balance, Rhomberg, finger to nose testing could not be completed due to clinical state Skin: Warm & dry w/o tenting. No significant lesions or rash.  See  clinical summary under each active problem in the Problem List with associated updated therapeutic plan

## 2021-11-24 DIAGNOSIS — E46 Unspecified protein-calorie malnutrition: Secondary | ICD-10-CM | POA: Insufficient documentation

## 2021-11-24 DIAGNOSIS — Z20828 Contact with and (suspected) exposure to other viral communicable diseases: Secondary | ICD-10-CM | POA: Insufficient documentation

## 2021-11-24 NOTE — Patient Instructions (Signed)
See assessment and plan under each diagnosis in the problem list and acutely for this visit 

## 2021-11-24 NOTE — Assessment & Plan Note (Signed)
Associated loss of taste,sore throat,& intermittent cough w scant sputum Afebrile & not dyspneic

## 2021-11-24 NOTE — Assessment & Plan Note (Signed)
Current total protein 5.1, albumin 2.4 Limb atrophy and interosseous wasting documented on physical Moderate - severe protein caloric malnutrition Nutrition consult at [facility]

## 2021-11-24 NOTE — Assessment & Plan Note (Signed)
She describes intermittent dizziness of variable intensity since childhood Antivert, Bonine & Dramamine of uncertain benefit . ENT consulted w/o definitive dx. Repeat ENT or Neuro evaluation as per PCP.

## 2021-11-24 NOTE — Assessment & Plan Note (Signed)
PT/OT @ SNF as tolerated. 

## 2021-11-26 ENCOUNTER — Other Ambulatory Visit (HOSPITAL_COMMUNITY)
Admission: RE | Admit: 2021-11-26 | Discharge: 2021-11-26 | Disposition: A | Discharge status: Home / Self Care | Payer: Medicare Other | Source: Skilled Nursing Facility | Attending: Adult Health | Admitting: Adult Health

## 2021-11-26 ENCOUNTER — Encounter: Payer: Self-pay | Admitting: Adult Health

## 2021-11-26 ENCOUNTER — Non-Acute Institutional Stay (SKILLED_NURSING_FACILITY): Payer: Medicare Other | Admitting: Adult Health

## 2021-11-26 ENCOUNTER — Other Ambulatory Visit: Payer: Self-pay | Admitting: Adult Health

## 2021-11-26 DIAGNOSIS — E782 Mixed hyperlipidemia: Secondary | ICD-10-CM | POA: Diagnosis not present

## 2021-11-26 DIAGNOSIS — H8113 Benign paroxysmal vertigo, bilateral: Secondary | ICD-10-CM | POA: Diagnosis not present

## 2021-11-26 DIAGNOSIS — I10 Essential (primary) hypertension: Secondary | ICD-10-CM | POA: Insufficient documentation

## 2021-11-26 LAB — BASIC METABOLIC PANEL
Anion gap: 9 (ref 5–15)
BUN: 9 mg/dL (ref 8–23)
CO2: 29 mmol/L (ref 22–32)
Calcium: 9 mg/dL (ref 8.9–10.3)
Chloride: 99 mmol/L (ref 98–111)
Creatinine, Ser: 0.56 mg/dL (ref 0.44–1.00)
GFR, Estimated: >60 mL/min (ref >60)
Glucose, Bld: 83 mg/dL (ref 70–99)
Potassium: 3.9 mmol/L (ref 3.5–5.1)
Sodium: 137 mmol/L (ref 135–145)

## 2021-11-26 LAB — MAGNESIUM: Magnesium: 1.3 mg/dL — ABNORMAL LOW (ref 1.7–2.4)

## 2021-11-26 NOTE — Progress Notes (Signed)
Location:  [place] Room Number: 159-P Place of Service:  SNF (31)   CODE STATUS: Full Code  Allergies  Allergen Reactions   Gabapentin     hallucinations   Other     Strong pain medications cause n/v   Morphine     Nausea & vomiting    Chief Complaint  Patient presents with   Acute Visit         Follow up concerns    HPI:  She is having difficulty tolerating meclizine she does better with dramamine. She has this ordered every 8 hours as needed. Her family states that she does not tolerate meclizine very well. She continues to have nausea with some vomiting  I have discussed with her; her medication regimen. We are able to stop her cholesterol medications. She wants the zofran prior to her meals.    Past Medical History:  Diagnosis Date   Allergy    seasonal & dust   Anxiety    Arthritis    DJD   Family history of adverse reaction to anesthesia    n/v    GERD (gastroesophageal reflux disease)    History of vertigo    Hyperlipidemia    Hypertension    Hypothyroidism    PONV (postoperative nausea and vomiting)    Thyroid disease    hypothyroidism post Hashimoto's & partial thyroidectomy    Past Surgical History:  Procedure Laterality Date   CATARACT EXTRACTION W/ INTRAOCULAR LENS  IMPLANT, BILATERAL     COLONOSCOPY     diverticulosis   JOINT REPLACEMENT  1995 & 1999   THR bilaterally    lumbar disc repair  1989   Dr Arrie Aran   SPINE SURGERY     cervical ; Dr Vertell Limber   THYROIDECTOMY, PARTIAL     benign nodule   TONSILLECTOMY AND ADENOIDECTOMY     TOTAL SHOULDER ARTHROPLASTY Left 02/27/2016   Procedure: LEFT REVERSE TOTAL SHOULDER ARTHROPLASTY;  Surgeon: Renette Butters, MD;  Location: [place];  Service: Orthopedics;  Laterality: Left;   vitreous detachment     Dr Claudean Kinds    Social History   Socioeconomic History   Marital status: Married    Spouse name: Not on file   Number of children: 2   Years of education: Not  on file   Highest education level: Not on file  Occupational History   Not on file  Tobacco Use   Smoking status: Never   Smokeless tobacco: Never  Vaping Use   Vaping Use: Never used  Substance and Sexual Activity   Alcohol use: No   Drug use: No   Sexual activity: Not on file  Other Topics Concern   Not on file  Social History Narrative   Married for 76 years    Daughter-nurse   Daughter- paralegal       Right handed   Social Determinants of Health   Financial Resource Strain: Low Risk    Difficulty of Paying Living Expenses: Not hard at all  Food Insecurity: Not on file  Transportation Needs: Not on file  Physical Activity: Inactive   Days of Exercise per Week: 0 days   Minutes of Exercise per Session: 0 min  Stress: No Stress Concern Present   Feeling of Stress : Not at all  Social Connections: Socially Integrated   Frequency of Communication with Friends and Family: More than three times a week   Frequency of Social Gatherings with Friends and Family:  Once a week   Attends Religious Services: More than 4 times per year   Active Member of Clubs or Organizations: Yes   Attends Archivist Meetings: 1 to 4 times per year   Marital Status: Married  Human resources officer Violence: Not At Risk   Fear of Current or Ex-Partner: No   Emotionally Abused: No   Physically Abused: No   Sexually Abused: No   Family History  Problem Relation Age of Onset   Diabetes Mother    Hepatitis Mother        Hepatitis B,? etiology   Miscarriages / Stillbirths Mother    Stroke Mother 78   Heart disease Father        CHF   Cancer Paternal Aunt        GI cancers   Heart disease Paternal Aunt        CHF   Cancer Paternal Uncle        lung, GI   Heart disease Paternal Uncle        CHF      VITAL SIGNS BP (!) 149/89    Pulse (!) 51    Temp 98.4 F (36.9 C)    Resp 18    Ht 5\' 5"  (1.651 m)    SpO2 93%    BMI 23.81 kg/m   Outpatient Encounter Medications as of  11/26/2021  Medication Sig   acetaminophen (TYLENOL) 500 MG tablet Take 500 mg by mouth every 6 (six) hours as needed.   chlorhexidine (PERIDEX) 0.12 % solution Use as directed 15 mLs in the mouth or throat 2 (two) times daily.   Cholecalciferol (VITAMIN D3) 1000 UNITS CAPS Take 1,000 Units by mouth daily.   Cranberry 200 MG CAPS Take 1 capsule by mouth daily.   diclofenac Sodium (VOLTAREN) 1 % GEL Apply 2 g topically 4 (four) times daily as needed (Pain).   dimenhyDRINATE (DRAMAMINE) 50 MG tablet Take 50 mg by mouth every 6 (six) hours as needed.   FLUoxetine (PROZAC) 20 MG capsule Take 1 capsule by mouth once daily   fluticasone (FLONASE) 50 MCG/ACT nasal spray Place 1 spray into both nostrils daily.   levothyroxine (SYNTHROID) 125 MCG tablet TAKE 1 TABLET BY MOUTH ONCE DAILY BEFORE BREAKFAST   loratadine (CLARITIN) 10 MG tablet Take 10 mg by mouth daily.   magnesium oxide (MAG-OX) 400 MG tablet Take 400 mg by mouth 2 (two) times daily. Special Instructions: for mag level of 1.3   meloxicam (MOBIC) 7.5 MG tablet Take 1 tablet by mouth twice daily   Multiple Vitamin (MULTIVITAMIN PO) Take 400 mg by mouth daily. Therems Multivitamin   NON FORMULARY Diet: Dysphagia 3 diet with thin liquids, NAS   Nutritional Supplements (ENSURE ENLIVE PO) Take by mouth. BID due to poor meal intake 1-50% and for repletion of protein stores. Special Instructions: Prefers vanilla or strawberry flavor.   ondansetron (ZOFRAN) 8 MG tablet Take 8 mg by mouth 3 (three) times daily before meals.   pantoprazole (PROTONIX) 40 MG tablet Take 1 tablet by mouth once daily   Polyvinyl Alcohol-Povidone (ARTIFICIAL TEARS) 5-6 MG/ML SOLN Place 2 drops into both eyes daily.   vitamin B-12 (CYANOCOBALAMIN) 250 MCG tablet Take 250 mcg by mouth daily.   No facility-administered encounter medications on file as of 11/26/2021.     SIGNIFICANT DIAGNOSTIC EXAMS   PREVIOUS   11-08-21: ct of left hip 1. Successful reduction of  the dislocated left hip arthroplasty. 2. No evidence of acute  fracture or hardware loosening. 3. No evidence of focal periarticular hematoma or other fluid collection.  11-19-21: chest x-ray:Low lung volumes and mild reticular opacities, potentially chronic changes/fibrosis versus sequela of viral infection.  NO NEW EXAMS   LABS REVIEWED PREVIOUS   11-07-21: wbc 6.9 hgb 10.3; hct 30.4; mcv 91.6 plt 187; glucose 109; bun 16; creat  0.65; k+ 2.9; na++ 138; ca 8.5; GFR >60; CRP 4.9 11-11-21: wbc 6.3; hgb 10.3; hct 29.1; mcv 89.0 plt 203; glucose 98; bun 9; creat 0.40; k+ 2.7; na++ 136 ca 7.6; GFR >60 11-14-21: wbc 5.4; hgb 10.9; hct 31.2 mcv 90.2 plt 214; glucose 119; bun 8; creat 040; k+ 3.2; na++ 136; ca 8.1; GFR>60 11-15-21: glucose 101; bun 8; creat 0.48; k+ 3.5; na++ 138; ca 8.1; GFR>60 11-16-21: glucose 83; bun 9; creat 0.65; k+ 3.9; na++ 137; ca 9.0 GFR>60  TODAY  11-26-21 glucose 83; bun 9; creat 0.56; k+ 3.9; na++ 137; ca 9.0; GFR>60; mag 1.3    Review of Systems  Constitutional:  Negative for malaise/fatigue.  Respiratory:  Negative for cough and shortness of breath.   Cardiovascular:  Negative for chest pain, palpitations and leg swelling.  Gastrointestinal:  Positive for nausea and vomiting. Negative for abdominal pain, constipation and heartburn.  Musculoskeletal:  Negative for back pain, joint pain and myalgias.  Skin: Negative.   Neurological:  Positive for dizziness.  Psychiatric/Behavioral:  The patient is not nervous/anxious.    Physical Exam Constitutional:      General: She is not in acute distress.    Appearance: She is well-developed. She is not diaphoretic.  Neck:     Thyroid: No thyromegaly.  Cardiovascular:     Rate and Rhythm: Normal rate and regular rhythm.     Pulses: Normal pulses.     Heart sounds: Normal heart sounds.  Pulmonary:     Effort: Pulmonary effort is normal. No respiratory distress.     Breath sounds: Normal breath sounds.  Abdominal:      General: Bowel sounds are normal. There is no distension.     Palpations: Abdomen is soft.     Tenderness: There is no abdominal tenderness.  Musculoskeletal:     Cervical back: Neck supple.     Right lower leg: No edema.     Left lower leg: No edema.     Comments:  Is able to move all extremities   Lymphadenopathy:     Cervical: No cervical adenopathy.  Skin:    General: Skin is warm and dry.  Neurological:     Mental Status: She is alert and oriented to person, place, and time.  Psychiatric:        Mood and Affect: Mood normal.     ASSESSMENT/ PLAN:  TODAY  Benign paroxysmal positional vertigo; bilateral Hyperlipidemia:   Will stop the following medications: fenofibrate; pravachol; due to her advanced these medications are not providing her with any clinical benefit  Ok Edwards NP [facility]  Contact [phone] Monday through Friday 8am- 5pm  After hours call [phone]

## 2021-11-29 ENCOUNTER — Other Ambulatory Visit (HOSPITAL_COMMUNITY)
Admission: RE | Admit: 2021-11-29 | Discharge: 2021-11-29 | Disposition: A | Discharge status: Home / Self Care | Payer: Medicare Other | Source: Skilled Nursing Facility | Attending: Adult Health | Admitting: Adult Health

## 2021-11-29 ENCOUNTER — Non-Acute Institutional Stay (SKILLED_NURSING_FACILITY): Payer: Medicare Other | Admitting: Adult Health

## 2021-11-29 ENCOUNTER — Encounter: Payer: Self-pay | Admitting: Adult Health

## 2021-11-29 DIAGNOSIS — J3089 Other allergic rhinitis: Secondary | ICD-10-CM | POA: Insufficient documentation

## 2021-11-29 DIAGNOSIS — R2689 Other abnormalities of gait and mobility: Secondary | ICD-10-CM | POA: Insufficient documentation

## 2021-11-29 LAB — MAGNESIUM: Magnesium: 1.3 mg/dL — ABNORMAL LOW (ref 1.7–2.4)

## 2021-11-29 NOTE — Progress Notes (Signed)
Location:  [place] Room Number: 159-P Place of Service:  SNF (31)   CODE STATUS: Full Code  Allergies  Allergen Reactions   Gabapentin     hallucinations   Other     Strong pain medications cause n/v   Morphine     Nausea & vomiting    Chief Complaint  Patient presents with   Acute Visit    Labs follow-up    HPI:  Her mag level is 1.3. she denies any uncontrolled pain. No changes in appetite; no constipation or diarrhea  Past Medical History:  Diagnosis Date   Allergy    seasonal & dust   Anxiety    Arthritis    DJD   Family history of adverse reaction to anesthesia    n/v    GERD (gastroesophageal reflux disease)    History of vertigo    Hyperlipidemia    Hypertension    Hypothyroidism    PONV (postoperative nausea and vomiting)    Thyroid disease    hypothyroidism post Hashimoto's & partial thyroidectomy    Past Surgical History:  Procedure Laterality Date   CATARACT EXTRACTION W/ INTRAOCULAR LENS  IMPLANT, BILATERAL     COLONOSCOPY     diverticulosis   JOINT REPLACEMENT  1995 & 1999   THR bilaterally    lumbar disc repair  1989   Dr Arrie Aran   SPINE SURGERY     cervical ; Dr Vertell Limber   THYROIDECTOMY, PARTIAL     benign nodule   TONSILLECTOMY AND ADENOIDECTOMY     TOTAL SHOULDER ARTHROPLASTY Left 02/27/2016   Procedure: LEFT REVERSE TOTAL SHOULDER ARTHROPLASTY;  Surgeon: Renette Butters, MD;  Location: [place];  Service: Orthopedics;  Laterality: Left;   vitreous detachment     Dr Claudean Kinds    Social History   Socioeconomic History   Marital status: Married    Spouse name: Not on file   Number of children: 2   Years of education: Not on file   Highest education level: Not on file  Occupational History   Not on file  Tobacco Use   Smoking status: Never   Smokeless tobacco: Never  Vaping Use   Vaping Use: Never used  Substance and Sexual Activity   Alcohol use: No   Drug use: No   Sexual activity: Not on  file  Other Topics Concern   Not on file  Social History Narrative   Married for 1 years    Daughter-nurse   Daughter- paralegal       Right handed   Social Determinants of Health   Financial Resource Strain: Low Risk    Difficulty of Paying Living Expenses: Not hard at all  Food Insecurity: Not on file  Transportation Needs: Not on file  Physical Activity: Inactive   Days of Exercise per Week: 0 days   Minutes of Exercise per Session: 0 min  Stress: No Stress Concern Present   Feeling of Stress : Not at all  Social Connections: Socially Integrated   Frequency of Communication with Friends and Family: More than three times a week   Frequency of Social Gatherings with Friends and Family: Once a week   Attends Religious Services: More than 4 times per year   Active Member of [employer] or Organizations: Yes   Attends Archivist Meetings: 1 to 4 times per year   Marital Status: Married  Human resources officer Violence: Not At Risk   Fear of Current or  Ex-Partner: No   Emotionally Abused: No   Physically Abused: No   Sexually Abused: No   Family History  Problem Relation Age of Onset   Diabetes Mother    Hepatitis Mother        Hepatitis B,? etiology   Miscarriages / Stillbirths Mother    Stroke Mother 6   Heart disease Father        CHF   Cancer Paternal Aunt        GI cancers   Heart disease Paternal Aunt        CHF   Cancer Paternal Uncle        lung, GI   Heart disease Paternal Uncle        CHF      VITAL SIGNS BP 121/68    Pulse 88    Temp 97.8 F (36.6 C)    Resp 20    Ht 5\' 5"  (1.651 m)    Wt 135 lb 6.4 oz (61.4 kg)    SpO2 95%    BMI 22.53 kg/m   Outpatient Encounter Medications as of 11/29/2021  Medication Sig   acetaminophen (TYLENOL) 500 MG tablet Take 500 mg by mouth every 6 (six) hours as needed.   chlorhexidine (PERIDEX) 0.12 % solution Use as directed 15 mLs in the mouth or throat 2 (two) times daily.   Cholecalciferol (VITAMIN D3) 1000  UNITS CAPS Take 1,000 Units by mouth daily.   Cranberry 200 MG CAPS Take 1 capsule by mouth daily.   diclofenac Sodium (VOLTAREN) 1 % GEL Apply 2 g topically 4 (four) times daily as needed (Pain).   dimenhyDRINATE (DRAMAMINE) 50 MG tablet Take 50 mg by mouth every 6 (six) hours as needed.   FLUoxetine (PROZAC) 20 MG capsule Take 1 capsule by mouth once daily   fluticasone (FLONASE) 50 MCG/ACT nasal spray Place 1 spray into both nostrils daily.   levothyroxine (SYNTHROID) 125 MCG tablet TAKE 1 TABLET BY MOUTH ONCE DAILY BEFORE BREAKFAST   loratadine (CLARITIN) 10 MG tablet Take 10 mg by mouth daily.   magnesium oxide (MAG-OX) 400 MG tablet Take 400 mg by mouth 2 (two) times daily. Special Instructions: for mag level of 1.3   meloxicam (MOBIC) 7.5 MG tablet Take 1 tablet by mouth twice daily   Multiple Vitamin (MULTIVITAMIN PO) Take 400 mg by mouth daily. Therems Multivitamin   NON FORMULARY Diet: Dysphagia 3 diet with thin liquids, NAS   ondansetron (ZOFRAN) 8 MG tablet Take 8 mg by mouth 3 (three) times daily before meals.   pantoprazole (PROTONIX) 40 MG tablet Take 1 tablet by mouth once daily   Polyvinyl Alcohol-Povidone (ARTIFICIAL TEARS) 5-6 MG/ML SOLN Place 2 drops into both eyes daily.   vitamin B-12 (CYANOCOBALAMIN) 250 MCG tablet Take 250 mcg by mouth daily.   Nutritional Supplements (ENSURE ENLIVE PO) Take by mouth. BID due to poor meal intake 1-50% and for repletion of protein stores. Special Instructions: Prefers vanilla or strawberry flavor.   No facility-administered encounter medications on file as of 11/29/2021.     SIGNIFICANT DIAGNOSTIC EXAMS  PREVIOUS   11-08-21: ct of left hip 1. Successful reduction of the dislocated left hip arthroplasty. 2. No evidence of acute fracture or hardware loosening. 3. No evidence of focal periarticular hematoma or other fluid collection.  11-19-21: chest x-ray:Low lung volumes and mild reticular opacities, potentially  chronic changes/fibrosis versus sequela of viral infection.  NO NEW EXAMS   LABS REVIEWED PREVIOUS   11-07-21: wbc 6.9  hgb 10.3; hct 30.4; mcv 91.6 plt 187; glucose 109; bun 16; creat  0.65; k+ 2.9; na++ 138; ca 8.5; GFR >60; CRP 4.9 11-11-21: wbc 6.3; hgb 10.3; hct 29.1; mcv 89.0 plt 203; glucose 98; bun 9; creat 0.40; k+ 2.7; na++ 136 ca 7.6; GFR >60 11-14-21: wbc 5.4; hgb 10.9; hct 31.2 mcv 90.2 plt 214; glucose 119; bun 8; creat 040; k+ 3.2; na++ 136; ca 8.1; GFR>60 11-15-21: glucose 101; bun 8; creat 0.48; k+ 3.5; na++ 138; ca 8.1; GFR>60 11-16-21: glucose 83; bun 9; creat 0.65; k+ 3.9; na++ 137; ca 9.0 GFR>60  TODAY  11-26-21 glucose 83; bun 9; creat 0.56; k+ 3.9; na++ 137; ca 9.0; GFR>60; mag 1.3  11-29-21: mag 1.3   Review of Systems  Constitutional:  Negative for malaise/fatigue.  Respiratory:  Negative for cough and shortness of breath.   Cardiovascular:  Negative for chest pain, palpitations and leg swelling.  Gastrointestinal:  Negative for abdominal pain, constipation and heartburn.  Musculoskeletal:  Negative for back pain, joint pain and myalgias.  Skin: Negative.   Neurological:  Negative for dizziness.  Psychiatric/Behavioral:  The patient is not nervous/anxious.    Physical Exam Constitutional:      General: She is not in acute distress.    Appearance: She is well-developed. She is not diaphoretic.  Neck:     Thyroid: No thyromegaly.  Cardiovascular:     Rate and Rhythm: Normal rate and regular rhythm.     Pulses: Normal pulses.     Heart sounds: Normal heart sounds.  Pulmonary:     Effort: Pulmonary effort is normal. No respiratory distress.     Breath sounds: Normal breath sounds.  Abdominal:     General: Bowel sounds are normal. There is no distension.     Palpations: Abdomen is soft.     Tenderness: There is no abdominal tenderness.  Musculoskeletal:        General: Normal range of motion.     Cervical back: Neck supple.     Right lower leg: No edema.      Left lower leg: No edema.  Lymphadenopathy:     Cervical: No cervical adenopathy.  Skin:    General: Skin is warm and dry.  Neurological:     Mental Status: She is alert and oriented to person, place, and time.  Psychiatric:        Mood and Affect: Mood normal.      ASSESSMENT/ PLAN:  TODAY  Hypomagnesemia; level is 1.3 will begin mag ox 400 mg twice daily will repeat level on 12-06-21    Ok Edwards NP [facility]  Contact [phone] Monday through Friday 8am- 5pm  After hours call [phone]

## 2021-12-04 ENCOUNTER — Other Ambulatory Visit: Payer: Self-pay | Admitting: Adult Health

## 2021-12-04 DIAGNOSIS — E039 Hypothyroidism, unspecified: Secondary | ICD-10-CM

## 2021-12-04 MED ORDER — MELOXICAM 7.5 MG PO TABS: 7.5000 mg | ORAL_TABLET | Freq: Two times a day (BID) | ORAL | 0 refills | Status: DC

## 2021-12-04 MED ORDER — LEVOTHYROXINE SODIUM 125 MCG PO TABS: 125.0000 ug | ORAL_TABLET | Freq: Every day | ORAL | 0 refills | Status: DC

## 2021-12-04 MED ORDER — MAGNESIUM OXIDE 400 MG PO TABS: 400.0000 mg | ORAL_TABLET | Freq: Two times a day (BID) | ORAL | 0 refills | Status: DC

## 2021-12-04 MED ORDER — ONDANSETRON HCL 8 MG PO TABS: 8.0000 mg | ORAL_TABLET | Freq: Three times a day (TID) | ORAL | 0 refills | Status: DC

## 2021-12-04 MED ORDER — PANTOPRAZOLE SODIUM 40 MG PO TBEC: 40.0000 mg | DELAYED_RELEASE_TABLET | Freq: Every day | ORAL | 0 refills | Status: DC

## 2021-12-04 MED ORDER — DICLOFENAC SODIUM 1 % EX GEL: 2.0000 g | Freq: Four times a day (QID) | CUTANEOUS | 0 refills | Status: DC | PRN

## 2021-12-04 MED ORDER — FLUOXETINE HCL 20 MG PO CAPS: 20.0000 mg | ORAL_CAPSULE | Freq: Every day | ORAL | 0 refills | Status: DC

## 2021-12-05 ENCOUNTER — Telehealth: Payer: Self-pay

## 2021-12-05 DIAGNOSIS — H8113 Benign paroxysmal vertigo, bilateral: Secondary | ICD-10-CM | POA: Diagnosis not present

## 2021-12-05 DIAGNOSIS — T84021D Dislocation of internal left hip prosthesis, subsequent encounter: Secondary | ICD-10-CM | POA: Diagnosis not present

## 2021-12-05 DIAGNOSIS — M159 Polyosteoarthritis, unspecified: Secondary | ICD-10-CM | POA: Diagnosis not present

## 2021-12-05 DIAGNOSIS — Z791 Long term (current) use of non-steroidal anti-inflammatories (NSAID): Secondary | ICD-10-CM | POA: Diagnosis not present

## 2021-12-05 DIAGNOSIS — K219 Gastro-esophageal reflux disease without esophagitis: Secondary | ICD-10-CM | POA: Diagnosis not present

## 2021-12-05 DIAGNOSIS — Z9181 History of falling: Secondary | ICD-10-CM | POA: Diagnosis not present

## 2021-12-05 DIAGNOSIS — Z8616 Personal history of COVID-19: Secondary | ICD-10-CM | POA: Diagnosis not present

## 2021-12-05 DIAGNOSIS — E785 Hyperlipidemia, unspecified: Secondary | ICD-10-CM | POA: Diagnosis not present

## 2021-12-05 DIAGNOSIS — I10 Essential (primary) hypertension: Secondary | ICD-10-CM | POA: Diagnosis not present

## 2021-12-05 DIAGNOSIS — E440 Moderate protein-calorie malnutrition: Secondary | ICD-10-CM | POA: Diagnosis not present

## 2021-12-05 DIAGNOSIS — E890 Postprocedural hypothyroidism: Secondary | ICD-10-CM | POA: Diagnosis not present

## 2021-12-05 NOTE — Telephone Encounter (Signed)
Caller name:Katheline Lovena Le ([place]   On DPR? :Yes  Call back [phone]  Provider they see: Birdie Riddle   Reason for call:Pt is the first days she started [place] today she has bed sore  just started. Home Health wants to talk to [facility]

## 2021-12-05 NOTE — Telephone Encounter (Signed)
LVM to return my call °

## 2021-12-06 DIAGNOSIS — H8113 Benign paroxysmal vertigo, bilateral: Secondary | ICD-10-CM | POA: Diagnosis not present

## 2021-12-06 DIAGNOSIS — Z791 Long term (current) use of non-steroidal anti-inflammatories (NSAID): Secondary | ICD-10-CM | POA: Diagnosis not present

## 2021-12-06 DIAGNOSIS — K219 Gastro-esophageal reflux disease without esophagitis: Secondary | ICD-10-CM | POA: Diagnosis not present

## 2021-12-06 DIAGNOSIS — Z9181 History of falling: Secondary | ICD-10-CM | POA: Diagnosis not present

## 2021-12-06 DIAGNOSIS — E890 Postprocedural hypothyroidism: Secondary | ICD-10-CM | POA: Diagnosis not present

## 2021-12-06 DIAGNOSIS — E440 Moderate protein-calorie malnutrition: Secondary | ICD-10-CM | POA: Diagnosis not present

## 2021-12-06 DIAGNOSIS — E785 Hyperlipidemia, unspecified: Secondary | ICD-10-CM | POA: Diagnosis not present

## 2021-12-06 DIAGNOSIS — I10 Essential (primary) hypertension: Secondary | ICD-10-CM | POA: Diagnosis not present

## 2021-12-06 DIAGNOSIS — Z8616 Personal history of COVID-19: Secondary | ICD-10-CM | POA: Diagnosis not present

## 2021-12-06 DIAGNOSIS — T84021D Dislocation of internal left hip prosthesis, subsequent encounter: Secondary | ICD-10-CM | POA: Diagnosis not present

## 2021-12-06 DIAGNOSIS — M159 Polyosteoarthritis, unspecified: Secondary | ICD-10-CM | POA: Diagnosis not present

## 2021-12-07 ENCOUNTER — Encounter: Payer: Self-pay | Admitting: Family Medicine

## 2021-12-07 ENCOUNTER — Ambulatory Visit (INDEPENDENT_AMBULATORY_CARE_PROVIDER_SITE_OTHER): Payer: Medicare Other | Admitting: Family Medicine

## 2021-12-07 VITALS — BP 118/72 | HR 64 | Temp 97.8°F | Ht 65.0 in | Wt 133.4 lb

## 2021-12-07 DIAGNOSIS — K222 Esophageal obstruction: Secondary | ICD-10-CM

## 2021-12-07 DIAGNOSIS — S73005D Unspecified dislocation of left hip, subsequent encounter: Secondary | ICD-10-CM | POA: Diagnosis not present

## 2021-12-07 DIAGNOSIS — K5900 Constipation, unspecified: Secondary | ICD-10-CM | POA: Diagnosis not present

## 2021-12-07 DIAGNOSIS — T753XXA Motion sickness, initial encounter: Secondary | ICD-10-CM

## 2021-12-07 DIAGNOSIS — H8193 Unspecified disorder of vestibular function, bilateral: Secondary | ICD-10-CM

## 2021-12-07 DIAGNOSIS — H538 Other visual disturbances: Secondary | ICD-10-CM | POA: Diagnosis not present

## 2021-12-07 LAB — BASIC METABOLIC PANEL
BUN: 11 mg/dL (ref 6–23)
CO2: 30 mEq/L (ref 19–32)
Calcium: 8.6 mg/dL (ref 8.4–10.5)
Chloride: 102 mEq/L (ref 96–112)
Creatinine, Ser: 0.58 mg/dL (ref 0.40–1.20)
GFR: 83.41 mL/min (ref >60.00)
Glucose, Bld: 105 mg/dL — ABNORMAL HIGH (ref 70–99)
Potassium: 4.3 mEq/L (ref 3.5–5.1)
Sodium: 140 mEq/L (ref 135–145)

## 2021-12-07 LAB — MAGNESIUM: Magnesium: 1.6 mg/dL (ref 1.5–2.5)

## 2021-12-07 LAB — TSH: TSH: 0.27 u[IU]/mL — ABNORMAL LOW (ref 0.35–5.50)

## 2021-12-07 MED ORDER — SCOPOLAMINE 1 MG/3DAYS TD PT72: 1.0000 | MEDICATED_PATCH | TRANSDERMAL | 12 refills | Status: DC

## 2021-12-07 MED ORDER — POLYETHYLENE GLYCOL 3350 17 GM/SCOOP PO POWD: 17.0000 g | Freq: Two times a day (BID) | ORAL | 1 refills | Status: DC | PRN

## 2021-12-07 NOTE — Patient Instructions (Signed)
Follow up as needed or as scheduled We'll notify you of your lab results and make any changes if needed APPLY the Scopolamine patch every 72 hrs to help w/ dizziness USE the Zofran as needed for nausea START Miralax daily to help w/ constipation We will call you with your GI referral, your eye exam, and the vestibular rehab Make sure you are drinking plenty of water Try and eat regularly Call with any questions or concerns Hang in there!!!

## 2021-12-07 NOTE — Progress Notes (Signed)
Subjective:    Patient ID: Tasha Moss, female    DOB: 17-Mar-1936, 85 y.o.   MRN: [id number]  [facility] f/u- pt was admitted 11/29-12/14 w/ a L hip dislocation s/p closed reduction.  She was making progress w/ PT and pain control but was dx'd w/ COVID on 12/4.  This delayed her transition to SNF but she was eventually d/c'd to SNF on 12/14.  Her rehab was complicated by vertigo w/ nausea and vomiting due to bilateral vestibular disorder and generalized weakness and debility.  She also struggled w/ low magnesium and is due for repeat level today.  Today pt reports feeling 'terrible'.  Pt reports she is unable to ride in a vehicle or wheelchair w/o severe dizziness and vomiting.  This has made participating in rehab 'nearly impossible'.  Pt reports she has always had motion sickness.  Pt stopped the Meclizine.  Using Dramamine.  Pt had speech tx in rehab and they indicated her palate is virtually a cleft palate w/ overlying skin.  She also was noted to have a narrow esophagus.  She is having a difficult time eating/swallowing.    + blurry vision. + extremely constipated- doing stool softeners, MoM, and just started Metamucil + incontinence which is causing bottom to be very raw  Denies any hip pain.   Review of Systems For ROS see HPI   This visit occurred during the SARS-CoV-2 public health emergency.  Safety protocols were in place, including screening questions prior to the visit, additional usage of staff PPE, and extensive cleaning of exam room while observing appropriate contact time as indicated for disinfecting solutions.      Objective:   Physical Exam Vitals reviewed.  Constitutional:      General: She is not in acute distress.    Appearance: She is not ill-appearing.     Comments: Frail, very emotional  HENT:     Head: Normocephalic and atraumatic.     Right Ear: Tympanic membrane and ear canal normal.     Left Ear: Tympanic membrane and ear canal normal.     Nose: No  congestion.  Eyes:     Extraocular Movements: Extraocular movements intact.     Conjunctiva/sclera: Conjunctivae normal.     Pupils: Pupils are equal, round, and reactive to light.  Cardiovascular:     Rate and Rhythm: Normal rate and regular rhythm.     Pulses: Normal pulses.  Pulmonary:     Effort: Pulmonary effort is normal. No respiratory distress.     Breath sounds: Normal breath sounds. No wheezing.  Abdominal:     General: There is no distension.     Palpations: Abdomen is soft.     Tenderness: There is no abdominal tenderness. There is no guarding.  Musculoskeletal:     Cervical back: Neck supple.  Lymphadenopathy:     Cervical: No cervical adenopathy.  Neurological:     Mental Status: She is alert and oriented to person, place, and time.  Psychiatric:     Comments: Very emotional, anxious          Assessment & Plan:  Hypomagnesemia- noted while hospitalized.  Check labs and adjust supplement prn.  Vestibular disorder/severe motions sickness- pt was not able to participate in rehab due to ongoing dizziness w/ N/V.  She is unable to ride in wheelchairs, cars, etc.  Vomited upon arrival to office.  Has meclizine but family has hard time remembering when to use this.  Will start scopolamine patch for  more steady relief.  Will also refer for vestibular rehab.  Constipation- new.  Suspect this is due to pain medication, sedentary existence, and lack of regular eating.  Will start daily Miralax and encouraged adequate hydration.  Will check labs to r/o thyroid issue.  Blurry vision- new.  Refer back to ophthalmology for complete evaluation.  Hip dislocation- pt reports being pain free but she is very debilitated and needs extensive therapy to resume a somewhat regular lifestyle.  Pt has HH PT and speech.  Will follow.

## 2021-12-08 DIAGNOSIS — E890 Postprocedural hypothyroidism: Secondary | ICD-10-CM | POA: Diagnosis not present

## 2021-12-08 DIAGNOSIS — E785 Hyperlipidemia, unspecified: Secondary | ICD-10-CM | POA: Diagnosis not present

## 2021-12-08 DIAGNOSIS — H8113 Benign paroxysmal vertigo, bilateral: Secondary | ICD-10-CM | POA: Diagnosis not present

## 2021-12-08 DIAGNOSIS — K219 Gastro-esophageal reflux disease without esophagitis: Secondary | ICD-10-CM | POA: Diagnosis not present

## 2021-12-08 DIAGNOSIS — Z791 Long term (current) use of non-steroidal anti-inflammatories (NSAID): Secondary | ICD-10-CM | POA: Diagnosis not present

## 2021-12-08 DIAGNOSIS — I10 Essential (primary) hypertension: Secondary | ICD-10-CM | POA: Diagnosis not present

## 2021-12-08 DIAGNOSIS — Z8616 Personal history of COVID-19: Secondary | ICD-10-CM | POA: Diagnosis not present

## 2021-12-08 DIAGNOSIS — M159 Polyosteoarthritis, unspecified: Secondary | ICD-10-CM | POA: Diagnosis not present

## 2021-12-08 DIAGNOSIS — E440 Moderate protein-calorie malnutrition: Secondary | ICD-10-CM | POA: Diagnosis not present

## 2021-12-08 DIAGNOSIS — Z9181 History of falling: Secondary | ICD-10-CM | POA: Diagnosis not present

## 2021-12-08 DIAGNOSIS — T84021D Dislocation of internal left hip prosthesis, subsequent encounter: Secondary | ICD-10-CM | POA: Diagnosis not present

## 2021-12-11 DIAGNOSIS — E890 Postprocedural hypothyroidism: Secondary | ICD-10-CM | POA: Diagnosis not present

## 2021-12-11 DIAGNOSIS — Z9181 History of falling: Secondary | ICD-10-CM | POA: Diagnosis not present

## 2021-12-11 DIAGNOSIS — Z791 Long term (current) use of non-steroidal anti-inflammatories (NSAID): Secondary | ICD-10-CM | POA: Diagnosis not present

## 2021-12-11 DIAGNOSIS — E440 Moderate protein-calorie malnutrition: Secondary | ICD-10-CM | POA: Diagnosis not present

## 2021-12-11 DIAGNOSIS — K219 Gastro-esophageal reflux disease without esophagitis: Secondary | ICD-10-CM | POA: Diagnosis not present

## 2021-12-11 DIAGNOSIS — E785 Hyperlipidemia, unspecified: Secondary | ICD-10-CM | POA: Diagnosis not present

## 2021-12-11 DIAGNOSIS — M159 Polyosteoarthritis, unspecified: Secondary | ICD-10-CM | POA: Diagnosis not present

## 2021-12-11 DIAGNOSIS — Z8616 Personal history of COVID-19: Secondary | ICD-10-CM | POA: Diagnosis not present

## 2021-12-11 DIAGNOSIS — T84021D Dislocation of internal left hip prosthesis, subsequent encounter: Secondary | ICD-10-CM | POA: Diagnosis not present

## 2021-12-11 DIAGNOSIS — I10 Essential (primary) hypertension: Secondary | ICD-10-CM | POA: Diagnosis not present

## 2021-12-11 DIAGNOSIS — H8113 Benign paroxysmal vertigo, bilateral: Secondary | ICD-10-CM | POA: Diagnosis not present

## 2021-12-11 NOTE — Assessment & Plan Note (Signed)
Ongoing issue.  Pt is having a very hard time eating w/ the exception of very small amounts.  Reports difficult time swallowing and early satiety.  Has hx of dilation and likely requires another.  Will refer back to GI for complete evaluation.  Pt expressed understanding and is in agreement w/ plan.

## 2021-12-11 NOTE — Progress Notes (Signed)
Chronic Care Management Pharmacy Note  12/12/2021 Name:  Tasha Moss MRN:  [id number] DOB:  May 31, 1936  Recommendations/Changes made from today's visit: No changes  Subjective: Tasha Moss is an 86 y.o. year old female who is a primary patient of Tabori, Aundra Millet, MD.  The CCM team was consulted for assistance with disease management and care coordination needs.    Engaged with patient by telephone for follow up visit in response to provider referral for pharmacy case management and/or care coordination services.   Consent to Services:  The patient was given information about Chronic Care Management services, agreed to services, and gave verbal consent prior to initiation of services.  Please see initial visit note for detailed documentation.   Patient Care Team: [name], MD as PCP - General (Family Medicine) Inda Castle, MD (Inactive) as Consulting Physician (Gastroenterology) Leta Baptist, MD as Consulting Physician (Otolaryngology) Erline Levine, MD as Consulting Physician (Neurosurgery) Renette Butters, MD as Attending Physician (Orthopedic Surgery) Harriett Sine, MD as Consulting Physician (Dermatology) Edythe Clarity, [facility] (Pharmacist)   Objective:  Lab Results  Component Value Date   CREATININE 0.58 12/07/2021   CREATININE 0.56 11/26/2021   CREATININE 0.41 (L) 11/20/2021    Lab Results  Component Value Date   HGBA1C 5.5 01/04/2015   Last diabetic Eye exam: No results found for: HMDIABEYEEXA  Last diabetic Foot exam: No results found for: HMDIABFOOTEX      Component Value Date/Time   CHOL 144 05/15/2021 1337   TRIG 116.0 05/15/2021 1337   HDL 49.40 05/15/2021 1337   CHOLHDL 3 05/15/2021 1337   VLDL 23.2 05/15/2021 1337   LDLCALC 71 05/15/2021 1337   LDLCALC 87 11/10/2019 0931   LDLDIRECT 82.0 04/09/2017 1339    Hepatic Function Latest Ref Rng & Units 11/19/2021 11/18/2021 11/17/2021  Total Protein 6.5 - 8.1 g/dL 5.1(L)  5.3(L) 5.6(L)  Albumin 3.5 - 5.0 g/dL 2.4(L) 2.6(L) 2.6(L)  AST 15 - 41 U/L 14(L) 16 17  ALT 0 - 44 U/L '14 16 19  ' Alk Phosphatase 38 - 126 U/L 31(L) 37(L) 37(L)  Total Bilirubin 0.3 - 1.2 mg/dL 0.7 0.6 0.7  Bilirubin, Direct 0.0 - 0.3 mg/dL - - -    Lab Results  Component Value Date/Time   TSH 0.27 (L) 12/07/2021 11:58 AM   TSH 0.36 10/10/2021 01:26 PM   FREET4 2.0 (H) 11/10/2019 09:31 AM    CBC Latest Ref Rng & Units 11/17/2021 11/14/2021 11/11/2021  WBC 4.0 - 10.5 K/uL 7.9 5.4 6.3  Hemoglobin 12.0 - 15.0 g/dL 10.8(L) 10.9(L) 10.3(L)  Hematocrit 36.0 - 46.0 % 31.9(L) 31.2(L) 29.1(L)  Platelets 150 - 400 K/uL 289 214 203    Lab Results  Component Value Date/Time   VD25OH 33.35 10/10/2021 01:26 PM    Clinical ASCVD:  The ASCVD Risk score (Arnett DK, et al., 2019) failed to calculate for the following reasons:   The 2019 ASCVD risk score is only valid for ages 29 to 8     Social History   Tobacco Use  Smoking Status Never  Smokeless Tobacco Never   BP Readings from Last 3 Encounters:  12/07/21 118/72  11/29/21 121/68  11/26/21 (!) 149/89   Pulse Readings from Last 3 Encounters:  12/07/21 64  11/29/21 88  11/26/21 (!) 51   Wt Readings from Last 3 Encounters:  12/07/21 133 lb 6.4 oz (60.5 kg)  11/29/21 135 lb 6.4 oz (61.4 kg)  11/07/21 143 lb 1.3  oz (64.9 kg)    Assessment: Review of patient past medical history, allergies, medications, health status, including review of consultants reports, laboratory and other test data, was performed as part of comprehensive evaluation and provision of chronic care management services.   SDOH:  (Social Determinants of Health) assessments and interventions performed:    CCM Care Plan  Allergies  Allergen Reactions   Gabapentin     hallucinations   Other     Strong pain medications cause n/v   Morphine     Nausea & vomiting    Medications Reviewed Today     Reviewed by Edythe Clarity, [facility] (Pharmacist) on  12/12/21 at [place] List Status: <None>   Medication Order Taking? Sig Documenting Provider Last Dose Status Informant  acetaminophen (TYLENOL) 500 MG tablet [id number] Yes Take 500 mg by mouth every 6 (six) hours as needed. [provider] Taking Active Multiple Informants, Child, Spouse/Significant Other  chlorhexidine (PERIDEX) 0.12 % solution [id number] Yes Use as directed 15 mLs in the mouth or throat 2 (two) times daily. [provider] Taking Active Multiple Informants, Child, Spouse/Significant Other  Cholecalciferol (VITAMIN D3) 1000 UNITS CAPS [id number] Yes Take 1,000 Units by mouth daily. [provider] Taking Active Multiple Informants, Child, Spouse/Significant Other           Med Note Nestor Lewandowsky   Mon Mar 04, 2016  3:05 PM)     Cranberry 200 MG CAPS [id number] Yes Take 1 capsule by mouth daily. [provider] Taking Active Multiple Informants, Child, Spouse/Significant Other  diclofenac Sodium (VOLTAREN) 1 % GEL [id number] Yes Apply 2 g topically 4 (four) times daily as needed (Pain). Gerlene Fee, NP Taking Active   dimenhyDRINATE (DRAMAMINE) 50 MG tablet [id number] Yes Take 50 mg by mouth every 6 (six) hours as needed. [provider] Taking Active Nursing Home Medication Administration Guide (MAG)  FLUoxetine (PROZAC) 20 MG capsule [id number] Yes Take 1 capsule (20 mg total) by mouth daily. Gerlene Fee, NP Taking Active   fluticasone [facility]) 50 MCG/ACT nasal spray [id number] Yes Place 1 spray into both nostrils daily. [provider] Taking Active Child, Spouse/Significant Other, Multiple Informants  levothyroxine (SYNTHROID) 125 MCG tablet [id number] Yes Take 1 tablet (125 mcg total) by mouth daily before breakfast. Gerlene Fee, NP Taking Active   loratadine (CLARITIN) 10 MG tablet [id number] Yes Take 10 mg by mouth daily. [provider] Taking Active Multiple Informants, Child, Spouse/Significant Other   magnesium oxide (MAG-OX) 400 MG tablet [id number] Yes Take 1 tablet (400 mg total) by mouth 2 (two) times daily. Special Instructions: for mag level of 1.3 Gerlene Fee, NP Taking Active   meloxicam (MOBIC) 7.5 MG tablet [id number] Yes Take 1 tablet (7.5 mg total) by mouth 2 (two) times daily. Gerlene Fee, NP Taking Active   Multiple Vitamin (MULTIVITAMIN PO) [id number] Yes Take 400 mg by mouth daily. Therems Multivitamin [provider] Taking Active   Baruch Gouty [id number] Yes Diet: Dysphagia 3 diet with thin liquids, NAS [provider] Taking Active Nursing Home Medication Administration Guide (MAG)  Nutritional Supplements (ENSURE ENLIVE PO) [id number] Yes Take by mouth. BID due to poor meal intake 1-50% and for repletion of protein stores. Special Instructions: Prefers vanilla or strawberry flavor. [provider] Taking Active Nursing Home Medication Administration Guide (MAG)  ondansetron (ZOFRAN) 8 MG tablet [id number] Yes Take 1 tablet (8 mg total) by mouth 3 (three) times daily before meals. Ok Edwards  S, NP Taking Active   pantoprazole (PROTONIX) 40 MG tablet [id number] Yes Take 1 tablet (40 mg total) by mouth daily. Gerlene Fee, NP Taking Active   polyethylene glycol powder (GLYCOLAX/MIRALAX) 17 GM/SCOOP powder [id number] Yes Take 17 g by mouth 2 (two) times daily as needed. [name], MD Taking Active   Polyvinyl Alcohol-Povidone (ARTIFICIAL TEARS) 5-6 MG/ML Bailey Mech [id number] Yes Place 2 drops into both eyes daily. [provider] Taking Active   scopolamine (TRANSDERM-SCOP) 1 MG/3DAYS [id number] Yes Place 1 patch (1.5 mg total) onto the skin every 3 (three) days. [name], MD Taking Active   vitamin B-12 (CYANOCOBALAMIN) 250 MCG tablet [id number] Yes Take 250 mcg by mouth daily. 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 ([place]) 11/06/2021   Elevated BP without diagnosis of hypertension 10/31/2020   Dizziness 10/31/2020   Status post reverse arthroplasty of left shoulder 03/11/2016   Depression with anxiety 03/11/2016   Routine general medical examination at a health care facility 06/29/2014   H/O thyroid nodule 06/21/2013   DIVERTICULOSIS, COLON 07/18/2010   GERD without esophagitis 04/09/2010   Stricture and stenosis of esophagus 02/21/2010   DYSPHAGIA 02/09/2010   Osteoarthritis 11/22/2009   BENIGN PAROXYSMAL POSITIONAL VERTIGO 05/15/2009   HTN (hypertension) 09/28/2008   SPINAL STENOSIS, LUMBAR 09/28/2008   PERNICIOUS ANEMIA 03/28/2008   Hypothyroidism 09/08/2007   HYPERLIPIDEMIA 09/08/2007   RESTLESS LEG SYNDROME 09/08/2007   HASHIMOTO'S THYROIDITIS 02/23/2007    Immunization History  Administered Date(s) Administered   Fluad Quad(high Dose 65+) 09/06/2019, 09/06/2020, 08/24/2021   Influenza, High Dose Seasonal PF 09/16/2013, 09/13/2015, 10/09/2017, 09/18/2018   Influenza,inj,Quad PF,6+ Mos 09/20/2014, 09/09/2016   Moderna Sars-Covid-2 Vaccination 01/01/2020, 01/31/2020, 11/15/2020   PPD Test 03/01/2016   Pneumococcal Conjugate-13 01/03/2015   Pneumococcal-Unspecified 12/10/2011    Conditions to be addressed/monitored: HLD HTN GERD Hypothyroidism   Care Plan : [place]  Updates made by Edythe Clarity, RPH since 12/12/2021 12:00 AM     Problem: HTN GERD RLS HLD      Long-Range Goal: Disease Management   Start Date: 06/06/2021  Expected End Date: 06/06/2022  Recent Progress: On track  Priority: High  Note:   Current Barriers:  Maintaining control over  hypothyroidism  Pharmacist Clinical Goal(s):  Patient will contact provider office for questions/concerns as evidenced notation of same in electronic health record through collaboration with PharmD and provider.   Interventions: 1:1 collaboration with [name], MD regarding development and update of comprehensive plan of care as evidenced by provider attestation and co-signature Inter-disciplinary care team collaboration (see longitudinal plan of care) Comprehensive medication review performed; medication list updated in electronic medical record  Hypertension (BP goal <140/90) -Controlled -Previous issues with high home BP readings, is very well controlled based off of previous office readings. Has taken on dietary recommendations with reducing daily salt intakes. Continues daily meloxicam to help with pain, withholding has caused significant distress previously.  -Current treatment: Propanolol ER 60 mg once daily (anxiety, tremor)  -Appropriate, Effective,  Safe, Accessible -Current home readings: advised not to test at home due to falsely high readings -Current dietary habits: loves to grill. In general trying to restrict carbohydrates.  -Current exercise habits: no formal exercise -Denies hypotensive/hypertensive symptoms -Counseled on diet and exercise extensively Recommended to continue current medication  Update 12/12/21 Patient continues to report normal BP at home. Is having some dizziness at home but this is ongoing issue. Denies any HA Recommend continue current meds, continue routine monitoring  Hypothyroidism (Goal: prevent TSH oversuppression) -Not ideally controlled -Dose reduced levothyroxine 125 mcg from 137 mcg on 05/16/2021 due to TSH of 0.33. confirmed that patient is now using lower dose and has received from the pharmacy.  -Current treatment  Levothyroxine 125 mcg once daily -Reviewed administration - no issues with consistent use.  -Recommended to  continue current medication Pt has TSH recheck already scheduled early 06/2021  Vertigo (Goal: Reduce dizzy spells) -Not ideally controlled -Current treatment  Scopolamine 67m/3days patch - Appropriate, Effective, Safe, Accessible Dramamine 585mdaily - Appropriate, Effective, Safe, Accessible -Medications previously tried: none noted Continues to have dizzy spells, especially when riding in wheel chair or a car Encouraged continued hydration  -Recommended to continue current medication Hopefully the patch will continue to improve symptoms, will continue to monitor and follow up   Patient Goals/Self-Care Activities Patient will:  - take medications as prescribed  Follow Up Plan: [place]/u call 6 months          Medication Assistance: None required.  Patient affirms current coverage meets needs.  Patient's preferred pharmacy is:  [place]one: [phone]: [phone] Follow Up:  Patient agrees to Care Plan and Follow-up.  Future Appointments  Date Time Provider [place]1/09/2022  2:30 PM KeNoralyn PickNP [facility]/11[facility]2022  1:30 PM [facility]  02/04/2022  2:15 PM EvEdrick KinsDPM [facility]   ChBeverly MilchPharmD Clinical Pharmacist  [phone]

## 2021-12-12 ENCOUNTER — Ambulatory Visit (INDEPENDENT_AMBULATORY_CARE_PROVIDER_SITE_OTHER): Payer: Medicare Other | Admitting: Pharmacist

## 2021-12-12 DIAGNOSIS — H8113 Benign paroxysmal vertigo, bilateral: Secondary | ICD-10-CM | POA: Diagnosis not present

## 2021-12-12 DIAGNOSIS — T84021D Dislocation of internal left hip prosthesis, subsequent encounter: Secondary | ICD-10-CM | POA: Diagnosis not present

## 2021-12-12 DIAGNOSIS — Z791 Long term (current) use of non-steroidal anti-inflammatories (NSAID): Secondary | ICD-10-CM | POA: Diagnosis not present

## 2021-12-12 DIAGNOSIS — M159 Polyosteoarthritis, unspecified: Secondary | ICD-10-CM | POA: Diagnosis not present

## 2021-12-12 DIAGNOSIS — E890 Postprocedural hypothyroidism: Secondary | ICD-10-CM | POA: Diagnosis not present

## 2021-12-12 DIAGNOSIS — K219 Gastro-esophageal reflux disease without esophagitis: Secondary | ICD-10-CM | POA: Diagnosis not present

## 2021-12-12 DIAGNOSIS — Z8616 Personal history of COVID-19: Secondary | ICD-10-CM | POA: Diagnosis not present

## 2021-12-12 DIAGNOSIS — I10 Essential (primary) hypertension: Secondary | ICD-10-CM | POA: Diagnosis not present

## 2021-12-12 DIAGNOSIS — E440 Moderate protein-calorie malnutrition: Secondary | ICD-10-CM | POA: Diagnosis not present

## 2021-12-12 DIAGNOSIS — E785 Hyperlipidemia, unspecified: Secondary | ICD-10-CM | POA: Diagnosis not present

## 2021-12-12 DIAGNOSIS — Z9181 History of falling: Secondary | ICD-10-CM | POA: Diagnosis not present

## 2021-12-12 NOTE — Patient Instructions (Addendum)
Visit Information   Goals Addressed             This Visit's Progress    Track and Manage My Blood Pressure-Hypertension       Timeframe:  Long-Range Goal Priority:  High Start Date: 12/12/21                            Expected End Date: 06/11/22                      Follow Up Date 03/12/22    - check blood pressure weekly - choose a place to take my blood pressure (home, clinic or office, retail store) - write blood pressure results in a log or diary    Why is this important?   You won't feel high blood pressure, but it can still hurt your blood vessels.  High blood pressure can cause heart or kidney problems. It can also cause a stroke.  Making lifestyle changes like losing a little weight or eating less salt will help.  Checking your blood pressure at home and at different times of the day can help to control blood pressure.  If the doctor prescribes medicine remember to take it the way the doctor ordered.  Call the office if you cannot afford the medicine or if there are questions about it.     Notes:        Patient Care Plan: CCM Pharmacy Care Plan     Problem Identified: HTN GERD RLS HLD      Long-Range Goal: Disease Management   Start Date: 06/06/2021  Expected End Date: 06/06/2022  Recent Progress: On track  Priority: High  Note:   Current Barriers:  Maintaining control over hypothyroidism  Pharmacist Clinical Goal(s):  Patient will contact provider office for questions/concerns as evidenced notation of same in electronic health record through collaboration with PharmD and provider.   Interventions: 1:1 collaboration with [name], MD regarding development and update of comprehensive plan of care as evidenced by provider attestation and co-signature Inter-disciplinary care team collaboration (see longitudinal plan of care) Comprehensive medication review performed; medication list updated in electronic medical record  Hypertension (BP goal  <140/90) -Controlled -Previous issues with high home BP readings, is very well controlled based off of previous office readings. Has taken on dietary recommendations with reducing daily salt intakes. Continues daily meloxicam to help with pain, withholding has caused significant distress previously.  -Current treatment: Propanolol ER 60 mg once daily (anxiety, tremor)  -Appropriate, Effective, Safe, Accessible -Current home readings: advised not to test at home due to falsely high readings -Current dietary habits: loves to grill. In general trying to restrict carbohydrates.  -Current exercise habits: no formal exercise -Denies hypotensive/hypertensive symptoms -Counseled on diet and exercise extensively Recommended to continue current medication  Update 12/12/21 Patient continues to report normal BP at home. Is having some dizziness at home but this is ongoing issue. Denies any HA Recommend continue current meds, continue routine monitoring  Hypothyroidism (Goal: prevent TSH oversuppression) -Not ideally controlled -Dose reduced levothyroxine 125 mcg from 137 mcg on 05/16/2021 due to TSH of 0.33. confirmed that patient is now using lower dose and has received from the pharmacy.  -Current treatment  Levothyroxine 125 mcg once daily -Reviewed administration - no issues with consistent use.  -Recommended to continue current medication Pt has TSH recheck already scheduled early 06/2021  Vertigo (Goal: Reduce dizzy spells) -Not ideally  controlled -Current treatment  Scopolamine 1mg /3days patch - Appropriate, Effective, Safe, Accessible Dramamine 50mg  daily - Appropriate, Effective, Safe, Accessible -Medications previously tried: none noted Continues to have dizzy spells, especially when riding in wheel chair or a car Encouraged continued hydration  -Recommended to continue current medication Hopefully the patch will continue to improve symptoms, will continue to monitor and follow  up   Patient Goals/Self-Care Activities Patient will:  - take medications as prescribed  Follow Up Plan: [facility] f/u call 6 months         Patient verbalizes understanding of instructions provided today and agrees to view in [place].  Telephone follow up appointment with pharmacy team member scheduled for: 6 months  Edythe Clarity, [place], PharmD Clinical Pharmacist  [facility] [phone]

## 2021-12-13 ENCOUNTER — Telehealth: Payer: Self-pay

## 2021-12-13 DIAGNOSIS — K219 Gastro-esophageal reflux disease without esophagitis: Secondary | ICD-10-CM | POA: Diagnosis not present

## 2021-12-13 DIAGNOSIS — I10 Essential (primary) hypertension: Secondary | ICD-10-CM | POA: Diagnosis not present

## 2021-12-13 DIAGNOSIS — Z791 Long term (current) use of non-steroidal anti-inflammatories (NSAID): Secondary | ICD-10-CM | POA: Diagnosis not present

## 2021-12-13 DIAGNOSIS — M159 Polyosteoarthritis, unspecified: Secondary | ICD-10-CM | POA: Diagnosis not present

## 2021-12-13 DIAGNOSIS — H8113 Benign paroxysmal vertigo, bilateral: Secondary | ICD-10-CM | POA: Diagnosis not present

## 2021-12-13 DIAGNOSIS — T84021D Dislocation of internal left hip prosthesis, subsequent encounter: Secondary | ICD-10-CM | POA: Diagnosis not present

## 2021-12-13 DIAGNOSIS — E890 Postprocedural hypothyroidism: Secondary | ICD-10-CM | POA: Diagnosis not present

## 2021-12-13 DIAGNOSIS — Z8616 Personal history of COVID-19: Secondary | ICD-10-CM | POA: Diagnosis not present

## 2021-12-13 DIAGNOSIS — E785 Hyperlipidemia, unspecified: Secondary | ICD-10-CM | POA: Diagnosis not present

## 2021-12-13 DIAGNOSIS — R7989 Other specified abnormal findings of blood chemistry: Secondary | ICD-10-CM

## 2021-12-13 DIAGNOSIS — Z9181 History of falling: Secondary | ICD-10-CM | POA: Diagnosis not present

## 2021-12-13 DIAGNOSIS — E440 Moderate protein-calorie malnutrition: Secondary | ICD-10-CM | POA: Diagnosis not present

## 2021-12-13 NOTE — Telephone Encounter (Signed)
Spoke to patient, she is aware of labs. Scheduled lab visit to recheck TSH in three weeks

## 2021-12-13 NOTE — Telephone Encounter (Signed)
-----   Message from [name], MD sent at 12/10/2021  9:06 PM EST ----- Labs look good!  Your TSH is a little low but I would prefer to recheck this is 3-4 weeks rather than adjust meds to allow your body time to normalize after all it's been through.

## 2021-12-14 DIAGNOSIS — Z8616 Personal history of COVID-19: Secondary | ICD-10-CM | POA: Diagnosis not present

## 2021-12-14 DIAGNOSIS — K219 Gastro-esophageal reflux disease without esophagitis: Secondary | ICD-10-CM | POA: Diagnosis not present

## 2021-12-14 DIAGNOSIS — E890 Postprocedural hypothyroidism: Secondary | ICD-10-CM | POA: Diagnosis not present

## 2021-12-14 DIAGNOSIS — E440 Moderate protein-calorie malnutrition: Secondary | ICD-10-CM | POA: Diagnosis not present

## 2021-12-14 DIAGNOSIS — M159 Polyosteoarthritis, unspecified: Secondary | ICD-10-CM | POA: Diagnosis not present

## 2021-12-14 DIAGNOSIS — I10 Essential (primary) hypertension: Secondary | ICD-10-CM | POA: Diagnosis not present

## 2021-12-14 DIAGNOSIS — E785 Hyperlipidemia, unspecified: Secondary | ICD-10-CM | POA: Diagnosis not present

## 2021-12-14 DIAGNOSIS — Z9181 History of falling: Secondary | ICD-10-CM | POA: Diagnosis not present

## 2021-12-14 DIAGNOSIS — T84021D Dislocation of internal left hip prosthesis, subsequent encounter: Secondary | ICD-10-CM | POA: Diagnosis not present

## 2021-12-14 DIAGNOSIS — Z791 Long term (current) use of non-steroidal anti-inflammatories (NSAID): Secondary | ICD-10-CM | POA: Diagnosis not present

## 2021-12-14 DIAGNOSIS — H8113 Benign paroxysmal vertigo, bilateral: Secondary | ICD-10-CM | POA: Diagnosis not present

## 2021-12-17 ENCOUNTER — Telehealth: Payer: Self-pay

## 2021-12-17 DIAGNOSIS — Z9181 History of falling: Secondary | ICD-10-CM | POA: Diagnosis not present

## 2021-12-17 DIAGNOSIS — E785 Hyperlipidemia, unspecified: Secondary | ICD-10-CM | POA: Diagnosis not present

## 2021-12-17 DIAGNOSIS — Z8616 Personal history of COVID-19: Secondary | ICD-10-CM | POA: Diagnosis not present

## 2021-12-17 DIAGNOSIS — I10 Essential (primary) hypertension: Secondary | ICD-10-CM | POA: Diagnosis not present

## 2021-12-17 DIAGNOSIS — E890 Postprocedural hypothyroidism: Secondary | ICD-10-CM | POA: Diagnosis not present

## 2021-12-17 DIAGNOSIS — M159 Polyosteoarthritis, unspecified: Secondary | ICD-10-CM | POA: Diagnosis not present

## 2021-12-17 DIAGNOSIS — K219 Gastro-esophageal reflux disease without esophagitis: Secondary | ICD-10-CM | POA: Diagnosis not present

## 2021-12-17 DIAGNOSIS — E440 Moderate protein-calorie malnutrition: Secondary | ICD-10-CM | POA: Diagnosis not present

## 2021-12-17 DIAGNOSIS — T84021D Dislocation of internal left hip prosthesis, subsequent encounter: Secondary | ICD-10-CM | POA: Diagnosis not present

## 2021-12-17 DIAGNOSIS — Z791 Long term (current) use of non-steroidal anti-inflammatories (NSAID): Secondary | ICD-10-CM | POA: Diagnosis not present

## 2021-12-17 DIAGNOSIS — H8113 Benign paroxysmal vertigo, bilateral: Secondary | ICD-10-CM | POA: Diagnosis not present

## 2021-12-17 NOTE — Telephone Encounter (Signed)
Caller name:Tasha Moss   On DPR? :Yes  Call back number:[phone]  Provider they see: Birdie Riddle   Reason for call:Plan of [place] placed in bin to be completed

## 2021-12-18 ENCOUNTER — Encounter: Payer: Self-pay | Admitting: Nurse Practitioner

## 2021-12-18 ENCOUNTER — Other Ambulatory Visit (INDEPENDENT_AMBULATORY_CARE_PROVIDER_SITE_OTHER): Payer: Medicare Other

## 2021-12-18 ENCOUNTER — Ambulatory Visit: Payer: Medicare Other | Admitting: Nurse Practitioner

## 2021-12-18 VITALS — BP 136/72 | HR 67 | Ht 65.0 in | Wt 133.0 lb

## 2021-12-18 DIAGNOSIS — Z9181 History of falling: Secondary | ICD-10-CM | POA: Diagnosis not present

## 2021-12-18 DIAGNOSIS — D649 Anemia, unspecified: Secondary | ICD-10-CM

## 2021-12-18 DIAGNOSIS — Z8616 Personal history of COVID-19: Secondary | ICD-10-CM | POA: Diagnosis not present

## 2021-12-18 DIAGNOSIS — I10 Essential (primary) hypertension: Secondary | ICD-10-CM | POA: Diagnosis not present

## 2021-12-18 DIAGNOSIS — M159 Polyosteoarthritis, unspecified: Secondary | ICD-10-CM | POA: Diagnosis not present

## 2021-12-18 DIAGNOSIS — R1310 Dysphagia, unspecified: Secondary | ICD-10-CM

## 2021-12-18 DIAGNOSIS — Z791 Long term (current) use of non-steroidal anti-inflammatories (NSAID): Secondary | ICD-10-CM | POA: Diagnosis not present

## 2021-12-18 DIAGNOSIS — R634 Abnormal weight loss: Secondary | ICD-10-CM | POA: Diagnosis not present

## 2021-12-18 DIAGNOSIS — E440 Moderate protein-calorie malnutrition: Secondary | ICD-10-CM | POA: Diagnosis not present

## 2021-12-18 DIAGNOSIS — H8113 Benign paroxysmal vertigo, bilateral: Secondary | ICD-10-CM | POA: Diagnosis not present

## 2021-12-18 DIAGNOSIS — E890 Postprocedural hypothyroidism: Secondary | ICD-10-CM | POA: Diagnosis not present

## 2021-12-18 DIAGNOSIS — E785 Hyperlipidemia, unspecified: Secondary | ICD-10-CM | POA: Diagnosis not present

## 2021-12-18 DIAGNOSIS — K219 Gastro-esophageal reflux disease without esophagitis: Secondary | ICD-10-CM | POA: Diagnosis not present

## 2021-12-18 DIAGNOSIS — T84021D Dislocation of internal left hip prosthesis, subsequent encounter: Secondary | ICD-10-CM | POA: Diagnosis not present

## 2021-12-18 LAB — CBC WITH DIFFERENTIAL/PLATELET
Basophils Absolute: 0 10*3/uL (ref 0.0–0.1)
Basophils Relative: 0.7 % (ref 0.0–3.0)
Eosinophils Absolute: 0.4 10*3/uL (ref 0.0–0.7)
Eosinophils Relative: 5.4 % — ABNORMAL HIGH (ref 0.0–5.0)
HCT: 38.9 % (ref 36.0–46.0)
Hemoglobin: 12.7 g/dL (ref 12.0–15.0)
Lymphocytes Relative: 15.5 % (ref 12.0–46.0)
Lymphs Abs: 1.1 10*3/uL (ref 0.7–4.0)
MCHC: 32.7 g/dL (ref 30.0–36.0)
MCV: 92.4 fl (ref 78.0–100.0)
Monocytes Absolute: 0.6 10*3/uL (ref 0.1–1.0)
Monocytes Relative: 8.4 % (ref 3.0–12.0)
Neutro Abs: 4.8 10*3/uL (ref 1.4–7.7)
Neutrophils Relative %: 70 % (ref 43.0–77.0)
Platelets: 254 10*3/uL (ref 150.0–400.0)
RBC: 4.21 Mil/uL (ref 3.87–5.11)
RDW: 14.9 % (ref 11.5–15.5)
WBC: 6.9 10*3/uL (ref 4.0–10.5)

## 2021-12-18 LAB — IRON,TIBC AND FERRITIN PANEL
%SAT: 16 % (calc) (ref 16–45)
Ferritin: 234 ng/mL (ref 16–288)
Iron: 36 ug/dL — ABNORMAL LOW (ref 45–160)
TIBC: 223 mcg/dL (calc) — ABNORMAL LOW (ref 250–450)

## 2021-12-18 NOTE — Patient Instructions (Signed)
IMAGING: You will be contacted by [facility] (Your caller ID will indicate phone # [phone]) in the next 7 days to schedule your Upper GI Series and Barium Swallow Test. If you have not heard from them within 7 business days, please call [facility] at [phone] to follow up on the status of your appointment.    LABS:  Lab work has been ordered for you today. Our lab is located in the basement. Press "B" on the elevator. The lab is located at the first door on the left as you exit the elevator.  HEALTHCARE LAWS AND MY CHART RESULTS: Due to recent changes in healthcare laws, you may see the results of your imaging and laboratory studies on MyChart before your provider has had a chance to review them.   We understand that in some cases there may be results that are confusing or concerning to you. Not all laboratory results come back in the same time frame and the provider may be waiting for multiple results in order to interpret others.  Please give [place] 48 hours in order for your provider to thoroughly review all the results before contacting the office for clarification of your results.   RECOMMENDATIONS: Ensure or Boost 1 bottle daily. Eat 4-5 small snack size meals daily.  It was great seeing you today! Thank you for entrusting me with your care and choosing [facility]  Noralyn Pick, CRNP  The  GI providers would like to encourage you to use [facility] to communicate with providers for non-urgent requests or questions.  Due to long hold times on the telephone, sending your provider a message by [facility] may be faster and more efficient way to get a response. Please allow 48 business hours for a response.  Please remember that this is for non-urgent requests/questions.  If you are age 38 or older, your body mass index should be between 23-30. Your Body mass index is 22.13 kg/m. If this is out of the aforementioned range  listed, please consider follow up with your Primary Care Provider.  If you are age 1 or younger, your body mass index should be between 19-25. Your Body mass index is 22.13 kg/m. If this is out of the aformentioned range listed, please consider follow up with your Primary Care Provider.

## 2021-12-18 NOTE — Progress Notes (Signed)
12/18/2021 Tasha Moss [id number] 1936/04/14   CHIEF COMPLAINT: Difficulty swallowing   HISTORY OF PRESENT ILLNESS: Tasha Moss is an 86 year old female with a past medical history of anxiety, arthritis, hypertension, hyperlipidemia, hypothyroidism s/p partial thyroidectomy, vertigo, Covid 19 infection 11/2021, anemia, GERD and an esophageal stricture.   She was admitted to the hospital 11/06/2021 due to having a closed left hip fracture s/p reduction which required discharged to [facility] 11/21/2021 due to severe deconditioning.  She also contracted COVID during this hospitalization.  She developed worsening vertigo with nausea and vomiting thought to be due to bilateral vestibular disorder during her time in rehab.  Her clinical status gradually improved and she was discharged home on 12/03/2021.    She presents to our office today as referred by Dr. Mable Paris for further evaluation regarding esophageal stenosis/stricture.  She complains of having difficulty swallowing solid food which has progressively worsened since her hospital admission 11/06/2021 as noted above.  She describes having difficulty shoe wean foods such as bread or meat which tends to ball up in her mouth which makes it difficult to swallow.  She also describes having food which gets stuck to the upper esophagus which occurs daily.  She coughs or drinks water and the stuck food passes.  No heartburn as long as she takes Protonix 40 mg daily.  No upper or lower abdominal pain.  She also has a decrease in appetite and everything taste like quinine since she had COVID.  She eats 4-5 spoonfuls of food and gets full easily.  No fever, sweats or chills.  Her daughter reports her mother has lost almost 50 pounds over the past year.  She has a history of vertigo with associated nausea and vomiting, evaluated by neurology several years ago.  She primarily has nausea and vertigo when sitting in the car as a passenger.  She was recently  prescribed a Scopolamine patch which has decreased her nausea and vomiting.  She has constipation which has improved after she started taking a stool softener.  She is passing a soft brown stool daily.  She no longer feels constipated.  She is intermittently drinking Ensure or Boost.  She is apparently scheduled for a swallow study with the speech pathologist home health care.  She underwent an EGD 03/01/2010 which showed gastritis, a stone found at the GE junction, early esophageal stricture with few esophageal erosions. Esophagus dilated with 18 mm dilator with mild resistance otherwise was a normal exam. Path report showed gastric body mucosa with minimal chronic inflammation, no evidence of Helicobacter pylori, intestinal metaplasia, dysplasia or malignancy identified.   She underwent a colonoscopy by Dr. Deatra Ina 5/10/2 2011 which showed moderate diverticulosis to the sigmoid colon, no polyps.  She reported several paternal aunts and uncles had stomach cancer and colon cancer.  CBC Latest Ref Rng & Units 11/17/2021 11/14/2021 11/11/2021  WBC 4.0 - 10.5 K/uL 7.9 5.4 6.3  Hemoglobin 12.0 - 15.0 g/dL 10.8(L) 10.9(L) 10.3(L)  Hematocrit 36.0 - 46.0 % 31.9(L) 31.2(L) 29.1(L)  Platelets 150 - 400 K/uL 289 214 203  MCV 91.4. CMP Latest Ref Rng & Units 12/07/2021 11/26/2021 11/20/2021  Glucose 70 - 99 mg/dL 105(H) 83 107(H)  BUN 6 - 23 mg/dL 11 9 <5(L)  Creatinine 0.40 - 1.20 mg/dL 0.58 0.56 0.41(L)  Sodium 135 - 145 mEq/L 140 137 136  Potassium 3.5 - 5.1 mEq/L 4.3 3.9 3.9  Chloride 96 - 112 mEq/L 102 99 106  CO2 19 -  32 mEq/L 30 29 24   Calcium 8.4 - 10.5 mg/dL 8.6 9.0 7.4(L)  Total Protein 6.5 - 8.1 g/dL - - -  Total Bilirubin 0.3 - 1.2 mg/dL - - -  Alkaline Phos 38 - 126 U/L - - -  AST 15 - 41 U/L - - -  ALT 0 - 44 U/L - - -    Colonoscopy 04/17/2010 by Dr. Deatra Ina: Moderate diverticulosis to the sigmoid colon Recall colonoscopy 10 years  EGD 03/01/2010: Gastritis was found in the total  stomach, diffusely erythematous mucosa, a stone found at the GE junction, early esophageal stricture with few esophageal erosions, esophagus dilated with 18 mm dilator with mild resistance otherwise was a normal exam. Path report showed gastric body mucosa with minimal chronic inflammation, no evidence of Helicobacter pylori, intestinal metaplasia, dysplasia or malignancy identified.   Brain MRI 10/23/2020: 1. No acute intracranial abnormality. 2. Multifocal hyperintense T2-weighted signal within the white matter, nonspecific but most commonly seen in chronic microvascular ischemia.   Past Medical History:  Diagnosis Date   Allergy    seasonal & dust   Anxiety    Arthritis    DJD   Family history of adverse reaction to anesthesia    n/v    GERD (gastroesophageal reflux disease)    History of vertigo    Hyperlipidemia    Hypertension    Hypothyroidism    PONV (postoperative nausea and vomiting)    Thyroid disease    hypothyroidism post Hashimoto's & partial thyroidectomy   Past Surgical History:  Procedure Laterality Date   CATARACT EXTRACTION W/ INTRAOCULAR LENS  IMPLANT, BILATERAL     COLONOSCOPY     diverticulosis   JOINT REPLACEMENT  1995 & 1999   THR bilaterally    lumbar disc repair  1989   Dr Arrie Aran   SPINE SURGERY     cervical ; Dr Vertell Limber   THYROIDECTOMY, PARTIAL     benign nodule   TONSILLECTOMY AND ADENOIDECTOMY     TOTAL SHOULDER ARTHROPLASTY Left 02/27/2016   Procedure: LEFT REVERSE TOTAL SHOULDER ARTHROPLASTY;  Surgeon: Renette Butters, MD;  Location: [facility];  Service: Orthopedics;  Laterality: Left;   vitreous detachment     Dr Claudean Kinds   Social History: She is married.  She is retired.  She has 3 daughters.  Non-smoker.  No alcohol use.  No drug use.  Family History: Several paternal aunt and uncles with stomach cancer and colon cancer.  Mother had a CVA.  Father had severe diverticulitis.   Allergies  Allergen Reactions   Gabapentin      hallucinations   Other     Strong pain medications cause n/v   Morphine     Nausea & vomiting     Outpatient Encounter Medications as of 12/18/2021  Medication Sig   acetaminophen (TYLENOL) 500 MG tablet Take 500 mg by mouth every 6 (six) hours as needed.   chlorhexidine (PERIDEX) 0.12 % solution Use as directed 15 mLs in the mouth or throat 2 (two) times daily.   Cholecalciferol (VITAMIN D3) 1000 UNITS CAPS Take 1,000 Units by mouth daily.   Cranberry 200 MG CAPS Take 1 capsule by mouth daily.   diclofenac Sodium (VOLTAREN) 1 % GEL Apply 2 g topically 4 (four) times daily as needed (Pain).   FLUoxetine (PROZAC) 20 MG capsule Take 1 capsule (20 mg total) by mouth daily.   fluticasone (FLONASE) 50 MCG/ACT nasal spray Place 1 spray into both nostrils daily.  levothyroxine (SYNTHROID) 125 MCG tablet Take 1 tablet (125 mcg total) by mouth daily before breakfast.   loratadine (CLARITIN) 10 MG tablet Take 10 mg by mouth daily.   meloxicam (MOBIC) 7.5 MG tablet Take 1 tablet (7.5 mg total) by mouth 2 (two) times daily.   Multiple Vitamin (MULTIVITAMIN PO) Take 400 mg by mouth daily. Therems Multivitamin   NON FORMULARY Diet: Dysphagia 3 diet with thin liquids, NAS   Nutritional Supplements (ENSURE ENLIVE PO) Take by mouth. BID due to poor meal intake 1-50% and for repletion of protein stores. Special Instructions: Prefers vanilla or strawberry flavor.   ondansetron (ZOFRAN) 8 MG tablet Take 1 tablet (8 mg total) by mouth 3 (three) times daily before meals.   pantoprazole (PROTONIX) 40 MG tablet Take 1 tablet (40 mg total) by mouth daily.   polyethylene glycol powder (GLYCOLAX/MIRALAX) 17 GM/SCOOP powder Take 17 g by mouth 2 (two) times daily as needed.   Polyvinyl Alcohol-Povidone (ARTIFICIAL TEARS) 5-6 MG/ML SOLN Place 2 drops into both eyes daily.   scopolamine (TRANSDERM-SCOP) 1 MG/3DAYS Place 1 patch (1.5 mg total) onto the skin every 3 (three) days.   vitamin B-12 (CYANOCOBALAMIN) 250 MCG  tablet Take 250 mcg by mouth daily.   dimenhyDRINATE (DRAMAMINE) 50 MG tablet Take 50 mg by mouth every 6 (six) hours as needed. (Patient not taking: Reported on 12/18/2021)   [DISCONTINUED] magnesium oxide (MAG-OX) 400 MG tablet Take 1 tablet (400 mg total) by mouth 2 (two) times daily. Special Instructions: for mag level of 1.3   No facility-administered encounter medications on file as of 12/18/2021.   REVIEW OF SYSTEMS:  Gen: See HPI.  + Fatigue. No fevers CV: + Leg swelling. Denies chest pain or palpitations Resp: Denies cough, shortness of breath of hemoptysis.  GI: See HPI.   GU : + Urinary leakage, excessive urination. MS: Back pain.  Derm: Denies rash, itchiness, skin lesions or unhealing ulcers. Psych: + Depression. Heme: Denies bruising, bleeding. Neuro:  + Vertigo and headaches. Endo:  Denies any problems with DM, thyroid or adrenal function.  PHYSICAL EXAM: BP 136/72    Pulse 67    Ht 5\' 5"  (1.651 m)    Wt 133 lb (60.3 kg)    BMI 22.13 kg/m  General: 86 year old female frail-appearing presents in a wheelchair in no acute distress. Head: Normocephalic and atraumatic. Eyes:  Sclerae non-icteric, conjunctive pink. Ears: Normal auditory acuity. Mouth: Dentition intact. No ulcers or lesions.  Neck: Supple, no lymphadenopathy or thyromegaly.  Lungs: Clear bilaterally to auscultation without wheezes, crackles or rhonchi. Heart: Regular rate and rhythm. No murmur, rub or gallop appreciated.  Abdomen: Soft, nontender, non distended. No masses. No hepatosplenomegaly. Normoactive bowel sounds x 4 quadrants.  Rectal: Deferred. Musculoskeletal: Symmetrical with no gross deformities. Skin: Warm and dry. No rash or lesions on visible extremities. Extremities: Trace lower extremity edema. Neurological: Alert oriented x 4.  Generalized weakness to all 4 extremities. Psychological:  Alert and cooperative. Normal mood and affect.  ASSESSMENT AND PLAN:  56) 86 year old female with a  history of GERD, early esophageal stricture (per EGD 3/201) presents for further evaluation regarding oral phase and esophageal dysphagia.  She describes having difficulty chewing food which results in large food boluses which are difficult to swallow.  She also has esophageal dysphagia, food gets stuck to the upper esophagus which occurs daily.  Positive early satiety.  No heartburn on Pantoprazole 40 mg daily.  No abdominal pain. -Proceed with swallow study with speech pathologist as  arranged by home health -Upper GI series with barium swallow with tablet to better assess her upper GI anatomy prior to pursuing endoscopic evaluation -She will likely require an EGD but would be best to allow her time to fully recover from her recent hospitalization and rehab.  However, if she has worsening dysphagia an EGD will be expedited. -Avoid foods such as bread and large pieces of meat.  Recommended boost or Ensure 8 ounces daily.  2) Patient was admitted to the hospital 11/06/2021 due to having a closed left hip fracture s/p reduction, discharged to SNF on 11/21/2021 and subsequently discharged home on 12/03/2021.  Home health is following.  3) Normocytic anemia.  No overt GI bleeding.  Hemoglobin 10.8.  Hematocrit 31.9.  On B12 p.o. -CBC, iron, iron saturation, TIBC, ferritin and B12 level  4) Patient reported several paternal aunts and uncles had stomach and colon cancer  5) Reported 50 pound weight loss over the past year.  Continued weight loss secondary to upper GI symptoms and change in taste following COVID infection likely contributing as well. -Consider chest/abdominal/pelvic CT -Patient to proceed with upper GI/barium swallow as ordered above    CC:  [name], MD

## 2021-12-19 DIAGNOSIS — I10 Essential (primary) hypertension: Secondary | ICD-10-CM | POA: Diagnosis not present

## 2021-12-19 DIAGNOSIS — E785 Hyperlipidemia, unspecified: Secondary | ICD-10-CM | POA: Diagnosis not present

## 2021-12-19 DIAGNOSIS — M159 Polyosteoarthritis, unspecified: Secondary | ICD-10-CM | POA: Diagnosis not present

## 2021-12-19 DIAGNOSIS — T84021D Dislocation of internal left hip prosthesis, subsequent encounter: Secondary | ICD-10-CM | POA: Diagnosis not present

## 2021-12-19 DIAGNOSIS — Z9181 History of falling: Secondary | ICD-10-CM | POA: Diagnosis not present

## 2021-12-19 DIAGNOSIS — E440 Moderate protein-calorie malnutrition: Secondary | ICD-10-CM | POA: Diagnosis not present

## 2021-12-19 DIAGNOSIS — Z791 Long term (current) use of non-steroidal anti-inflammatories (NSAID): Secondary | ICD-10-CM | POA: Diagnosis not present

## 2021-12-19 DIAGNOSIS — E890 Postprocedural hypothyroidism: Secondary | ICD-10-CM | POA: Diagnosis not present

## 2021-12-19 DIAGNOSIS — K219 Gastro-esophageal reflux disease without esophagitis: Secondary | ICD-10-CM | POA: Diagnosis not present

## 2021-12-19 DIAGNOSIS — Z8616 Personal history of COVID-19: Secondary | ICD-10-CM | POA: Diagnosis not present

## 2021-12-19 DIAGNOSIS — H8113 Benign paroxysmal vertigo, bilateral: Secondary | ICD-10-CM | POA: Diagnosis not present

## 2021-12-20 ENCOUNTER — Ambulatory Visit (INDEPENDENT_AMBULATORY_CARE_PROVIDER_SITE_OTHER): Payer: Medicare Other

## 2021-12-20 ENCOUNTER — Telehealth: Payer: Self-pay | Admitting: Family Medicine

## 2021-12-20 DIAGNOSIS — Z0000 Encounter for general adult medical examination without abnormal findings: Secondary | ICD-10-CM

## 2021-12-20 DIAGNOSIS — H8113 Benign paroxysmal vertigo, bilateral: Secondary | ICD-10-CM

## 2021-12-20 NOTE — Telephone Encounter (Signed)
New order placed

## 2021-12-20 NOTE — Telephone Encounter (Signed)
Order entered incorrectly, needs a referral to neuro rehab for the PT vestibular rehab  [place] is [facility]

## 2021-12-20 NOTE — Progress Notes (Signed)
Subjective:   Tasha Moss is a 86 y.o. female who presents for Medicare Annual (Subsequent) preventive examination.   I connected with Tasha Moss  today by telephone and verified that I am speaking with the correct person using two identifiers. Location patient: home Location provider: work Persons participating in the virtual visit: patient, provider.   I discussed the limitations, risks, security and privacy concerns of performing an evaluation and management service by telephone and the availability of in person appointments. I also discussed with the patient that there may be a patient responsible charge related to this service. The patient expressed understanding and verbally consented to this telephonic visit.    Interactive audio and video telecommunications were attempted between this provider and patient, however failed, due to patient having technical difficulties OR patient did not have access to video capability.  We continued and completed visit with audio only.    Review of Systems     Cardiac Risk Factors include: advanced age (>29men, >87 women);dyslipidemia;hypertension     Objective:    Today's Vitals   There is no height or weight on file to calculate BMI.  Advanced Directives 12/20/2021 11/29/2021 11/26/2021 11/22/2021 11/06/2021 11/05/2021 01/05/2021  Does Patient Have a Medical Advance Directive? Yes Yes Yes Yes Yes Yes Yes  Type of Paramedic of [place];Living will [place];Living will Healthcare Power of [place];Living will [place];Living will [place];Living will [place];Living will  Does patient want to make changes to medical advance directive? - No - Patient declined No - Patient declined No - Patient declined No - Patient declined - Yes (Inpatient - patient defers changing a medical advance directive and declines  information at this time)  Copy of [place] in Chart? No - copy requested Yes - validated most recent copy scanned in chart (See row information) Yes - validated most recent copy scanned in chart (See row information) Yes - validated most recent copy scanned in chart (See row information) No - copy requested No - copy requested -  Would patient like information on creating a medical advance directive? - - - - - - -    Current Medications (verified) Outpatient Encounter Medications as of 12/20/2021  Medication Sig   acetaminophen (TYLENOL) 500 MG tablet Take 500 mg by mouth every 6 (six) hours as needed.   Cholecalciferol (VITAMIN D3) 1000 UNITS CAPS Take 1,000 Units by mouth daily.   Cranberry 200 MG CAPS Take 1 capsule by mouth daily.   dimenhyDRINATE (DRAMAMINE) 50 MG tablet Take 50 mg by mouth every 6 (six) hours as needed.   FLUoxetine (PROZAC) 20 MG capsule Take 1 capsule (20 mg total) by mouth daily.   fluticasone (FLONASE) 50 MCG/ACT nasal spray Place 1 spray into both nostrils daily.   levothyroxine (SYNTHROID) 125 MCG tablet Take 1 tablet (125 mcg total) by mouth daily before breakfast.   loratadine (CLARITIN) 10 MG tablet Take 10 mg by mouth daily.   meloxicam (MOBIC) 7.5 MG tablet Take 1 tablet (7.5 mg total) by mouth 2 (two) times daily.   Multiple Vitamin (MULTIVITAMIN PO) Take 400 mg by mouth daily. [place] Multivitamin   NON FORMULARY Diet: Dysphagia 3 diet with thin liquids, NAS   Nutritional Supplements (ENSURE ENLIVE PO) Take by mouth. BID due to poor meal intake 1-50% and for repletion of protein stores. Special Instructions: Prefers vanilla or strawberry flavor.   ondansetron (  ZOFRAN) 8 MG tablet Take 1 tablet (8 mg total) by mouth 3 (three) times daily before meals.   pantoprazole (PROTONIX) 40 MG tablet Take 1 tablet (40 mg total) by mouth daily.   Polyvinyl Alcohol-Povidone (ARTIFICIAL TEARS) 5-6 MG/ML SOLN Place 2 drops into both eyes daily.    scopolamine (TRANSDERM-SCOP) 1 MG/3DAYS Place 1 patch (1.5 mg total) onto the skin every 3 (three) days.   vitamin B-12 (CYANOCOBALAMIN) 250 MCG tablet Take 250 mcg by mouth daily.   chlorhexidine (PERIDEX) 0.12 % solution Use as directed 15 mLs in the mouth or throat 2 (two) times daily. (Patient not taking: Reported on 12/20/2021)   diclofenac Sodium (VOLTAREN) 1 % GEL Apply 2 g topically 4 (four) times daily as needed (Pain). (Patient not taking: Reported on 12/20/2021)   polyethylene glycol powder (GLYCOLAX/MIRALAX) 17 GM/SCOOP powder Take 17 g by mouth 2 (two) times daily as needed. (Patient not taking: Reported on 12/20/2021)   No facility-administered encounter medications on file as of 12/20/2021.    Allergies (verified) Gabapentin, Other, and Morphine   History: Past Medical History:  Diagnosis Date   Allergy    seasonal & dust   Anxiety    Arthritis    DJD   Family history of adverse reaction to anesthesia    n/v    GERD (gastroesophageal reflux disease)    History of vertigo    Hyperlipidemia    Hypertension    Hypothyroidism    PONV (postoperative nausea and vomiting)    Thyroid disease    hypothyroidism post Hashimoto's & partial thyroidectomy   Past Surgical History:  Procedure Laterality Date   CATARACT EXTRACTION W/ INTRAOCULAR LENS  IMPLANT, BILATERAL     COLONOSCOPY     diverticulosis   JOINT REPLACEMENT  1995 & 1999   THR bilaterally    lumbar disc repair  1989   Dr Arrie Aran   SPINE SURGERY     cervical ; Dr Vertell Limber   THYROIDECTOMY, PARTIAL     benign nodule   TONSILLECTOMY AND ADENOIDECTOMY     TOTAL SHOULDER ARTHROPLASTY Left 02/27/2016   Procedure: LEFT REVERSE TOTAL SHOULDER ARTHROPLASTY;  Surgeon: Renette Butters, MD;  Location: [facility];  Service: Orthopedics;  Laterality: Left;   vitreous detachment     Dr Claudean Kinds   Family History  Problem Relation Age of Onset   Diabetes Mother    Hepatitis Mother        Hepatitis B,? etiology    Miscarriages / Stillbirths Mother    Stroke Mother 50   Heart disease Father        CHF   Cancer Paternal Aunt        GI cancers   Heart disease Paternal Aunt        CHF   Cancer Paternal Uncle        lung, GI   Heart disease Paternal Uncle        CHF   Social History   Socioeconomic History   Marital status: Married    Spouse name: Not on file   Number of children: 2   Years of education: Not on file   Highest education level: Not on file  Occupational History   Not on file  Tobacco Use   Smoking status: Never   Smokeless tobacco: Never  Vaping Use   Vaping Use: Never used  Substance and Sexual Activity   Alcohol use: No   Drug use: No   Sexual activity: Not on  file  Other Topics Concern   Not on file  Social History Narrative   Married for 71 years    Daughter-nurse   Daughter- paralegal       Right handed   Social Determinants of Health   Financial Resource Strain: Low Risk    Difficulty of Paying Living Expenses: Not hard at all  Food Insecurity: No Food Insecurity   Worried About Charity fundraiser in the Last Year: Never true   Arboriculturist in the Last Year: Never true  Transportation Needs: No Transportation Needs   Lack of Transportation (Medical): No   Lack of Transportation (Non-Medical): No  Physical Activity: Inactive   Days of Exercise per Week: 0 days   Minutes of Exercise per Session: 0 min  Stress: No Stress Concern Present   Feeling of Stress : Not at all  Social Connections: Moderately Integrated   Frequency of Communication with Friends and Family: Twice a week   Frequency of Social Gatherings with Friends and Family: Twice a week   Attends Religious Services: More than 4 times per year   Active Member of [employer] or Organizations: No   Attends Music therapist: Never   Marital Status: Married    Tobacco Counseling Counseling given: Not Answered   Clinical Intake:  Pre-visit preparation completed: Yes  Pain :  No/denies pain     Nutritional Risks: None Diabetes: No  How often do you need to have someone help you when you read instructions, pamphlets, or other written materials from your doctor or pharmacy?: 1 - Never What is the last grade level you completed in school?: [place]   Interpreter Needed?: No  Information entered by :: [place] of Daily Living In your present state of health, do you have any difficulty performing the following activities: 12/20/2021 11/06/2021  Hearing? N Y  Vision? N N  Comment - -  Difficulty concentrating or making decisions? N N  Walking or climbing stairs? Y Y  Comment walker -  Dressing or bathing? N Y  Doing errands, shopping? N N  Preparing Food and eating ? N -  Using the Toilet? N -  In the past six months, have you accidently leaked urine? N -  Do you have problems with loss of bowel control? N -  Managing your Finances? N -  Housekeeping or managing your Housekeeping? N -  Some recent data might be hidden    Patient Care Team: [name], MD as PCP - General (Family Medicine) Inda Castle, MD (Inactive) as Consulting Physician (Gastroenterology) Leta Baptist, MD as Consulting Physician (Otolaryngology) Erline Levine, MD as Consulting Physician (Neurosurgery) Renette Butters, MD as Attending Physician (Orthopedic Surgery) Harriett Sine, MD as Consulting Physician (Dermatology) Edythe Clarity, [facility] (Pharmacist)  Indicate any recent Medical Services you may have received from other than [facility] providers in the past year (date may be approximate).     Assessment:   This is a routine wellness examination for [place].  Hearing/Vision screen Vision Screening - Comments:: Annual eye exams wear glasses  Dietary issues and exercise activities discussed: Current Exercise Habits: The patient does not participate in regular exercise at present, Exercise limited by: orthopedic  condition(s);neurologic condition(s)   Goals Addressed   None    Depression Screen PHQ 2/9 Scores 12/20/2021 12/20/2021 05/15/2021 02/13/2021 02/02/2021 12/18/2020 11/14/2020  PHQ - 2 Score 0 0 0 0 0 0 0  PHQ- 9 Score - -  0 0 0 - 0  Exception Documentation - - - - - - -    Fall Risk Fall Risk  12/20/2021 12/07/2021 05/15/2021 02/13/2021 02/02/2021  Falls in the past year? 0 1 0 0 0  Number falls in past yr: 0 0 0 0 0  Injury with Fall? 0 1 0 0 0  Risk for fall due to : Impaired balance/gait - No Fall Risks No Fall Risks No Fall Risks  Risk for fall due to: Comment walker - - - -  Follow up Falls evaluation completed;Education provided Falls evaluation completed - - -    FALL RISK PREVENTION PERTAINING TO THE HOME:  Any stairs in or around the home? Yes  If so, are there any without handrails? No  Home free of loose throw rugs in walkways, pet beds, electrical cords, etc? Yes  Adequate lighting in your home to reduce risk of falls? Yes   ASSISTIVE DEVICES UTILIZED TO PREVENT FALLS:  Life alert? No  Use of a cane, walker or w/c? Yes  Grab bars in the bathroom? Yes  Shower chair or bench in shower? Yes  Elevated toilet seat or a handicapped toilet? Yes    Cognitive Function:    Normal cognitive status assessed by direct observation by this Nurse Health Advisor. No abnormalities found.      Immunizations Immunization History  Administered Date(s) Administered   Fluad Quad(high Dose 65+) 09/06/2019, 09/06/2020, 08/24/2021   Influenza, High Dose Seasonal PF 09/16/2013, 09/13/2015, 10/09/2017, 09/18/2018   Influenza,inj,Quad PF,6+ Mos 09/20/2014, 09/09/2016   Moderna Sars-Covid-2 Vaccination 01/01/2020, 01/31/2020, 11/15/2020   PPD Test 03/01/2016   Pneumococcal Conjugate-13 01/03/2015   Pneumococcal-Unspecified 12/10/2011    TDAP status: Due, Education has been provided regarding the importance of this vaccine. Advised may receive this vaccine at local pharmacy or Health Dept.  Aware to provide a copy of the vaccination record if obtained from local pharmacy or Health Dept. Verbalized acceptance and understanding.  Flu Vaccine status: Up to date  Pneumococcal vaccine status: Up to date  Covid-19 vaccine status: Completed vaccines  Qualifies for Shingles Vaccine? Yes   Zostavax completed No   Shingrix Completed?: No.    Education has been provided regarding the importance of this vaccine. Patient has been advised to call insurance company to determine out of pocket expense if they have not yet received this vaccine. Advised may also receive vaccine at local pharmacy or Health Dept. Verbalized acceptance and understanding.  Screening Tests Health Maintenance  Topic Date Due   TETANUS/TDAP  Never done   Zoster Vaccines- Shingrix (1 of 2) Never done   Pneumonia Vaccine 52+ Years old (2 - PPSV23 if available, else PCV20) 01/04/2016   COVID-19 Vaccine (4 - Booster for Moderna series) 01/10/2021   INFLUENZA VACCINE  Completed   DEXA SCAN  Completed   HPV VACCINES  Aged Out    Health Maintenance  Health Maintenance Due  Topic Date Due   TETANUS/TDAP  Never done   Zoster Vaccines- Shingrix (1 of 2) Never done   Pneumonia Vaccine 31+ Years old (2 - PPSV23 if available, else PCV20) 01/04/2016   COVID-19 Vaccine (4 - Booster for Moderna series) 01/10/2021    Colorectal cancer screening: No longer required.   Mammogram status: No longer required due to age.  Bone Density status: Ordered patient declined . Pt provided with contact info and advised to call to schedule appt.  Lung Cancer Screening: (Low Dose CT Chest recommended if Age 32-80 years, 35  pack-year currently smoking OR have quit w/in 15years.) does not qualify.   Lung Cancer Screening Referral: n/a  Additional Screening:  Hepatitis C Screening: does not qualify;   Vision Screening: Recommended annual ophthalmology exams for early detection of glaucoma and other disorders of the eye. Is the  patient up to date with their annual eye exam?  Yes  Who is the provider or what is the name of the office in which the patient attends annual eye exams? Dr.Tanner  If pt is not established with a provider, would they like to be referred to a provider to establish care? No .   Dental Screening: Recommended annual dental exams for proper oral hygiene  Community Resource Referral / Chronic Care Management: CRR required this visit?  No   CCM required this visit?  No      Plan:     I have personally reviewed and noted the following in the patients chart:   Medical and social history Use of alcohol, tobacco or illicit drugs  Current medications and supplements including opioid prescriptions.  Functional ability and status Nutritional status Physical activity Advanced directives List of other physicians Hospitalizations, surgeries, and ER visits in previous 12 months Vitals Screenings to include cognitive, depression, and falls Referrals and appointments  In addition, I have reviewed and discussed with patient certain preventive protocols, quality metrics, and best practice recommendations. A written personalized care plan for preventive services as well as general preventive health recommendations were provided to patient.     Randel Pigg, LPN   [id number]/[id number]   Nurse Notes: none

## 2021-12-20 NOTE — Progress Notes (Signed)
Agree with the assessment and plan as outlined by Colleen Kennedy-Smith, NP.   Ramiyah Mcclenahan, DO, FACG [facility]   

## 2021-12-20 NOTE — Patient Instructions (Signed)
Tasha Moss , Thank you for taking time to come for your Medicare Wellness Visit. I appreciate your ongoing commitment to your health goals. Please review the following plan we discussed and let me know if I can assist you in the future.   Screening recommendations/referrals: Colonoscopy: no longer required  Mammogram: no longer required  Bone Density: declined  Recommended yearly ophthalmology/optometry visit for glaucoma screening and checkup Recommended yearly dental visit for hygiene and checkup  Vaccinations: Influenza vaccine: completed  Pneumococcal vaccine: completed  Tdap vaccine: due  Shingles vaccine: will consider     Advanced directives: yes   Conditions/risks identified: none   Next appointment: none    Preventive Care 65 Years and Older, Female Preventive care refers to lifestyle choices and visits with your health care provider that can promote health and wellness. What does preventive care include? A yearly physical exam. This is also called an annual well check. Dental exams once or twice a year. Routine eye exams. Ask your health care provider how often you should have your eyes checked. Personal lifestyle choices, including: Daily care of your teeth and gums. Regular physical activity. Eating a healthy diet. Avoiding tobacco and drug use. Limiting alcohol use. Practicing safe sex. Taking low-dose aspirin every day. Taking vitamin and mineral supplements as recommended by your health care provider. What happens during an annual well check? The services and screenings done by your health care provider during your annual well check will depend on your age, overall health, lifestyle risk factors, and family history of disease. Counseling  Your health care provider may ask you questions about your: Alcohol use. Tobacco use. Drug use. Emotional well-being. Home and relationship well-being. Sexual activity. Eating habits. History of falls. Memory and  ability to understand (cognition). Work and work Statistician. Reproductive health. Screening  You may have the following tests or measurements: Height, weight, and BMI. Blood pressure. Lipid and cholesterol levels. These may be checked every 5 years, or more frequently if you are over 90 years old. Skin check. Lung cancer screening. You may have this screening every year starting at age 84 if you have a 30-pack-year history of smoking and currently smoke or have quit within the past 15 years. Fecal occult blood test (FOBT) of the stool. You may have this test every year starting at age 84. Flexible sigmoidoscopy or colonoscopy. You may have a sigmoidoscopy every 5 years or a colonoscopy every 10 years starting at age 84. Hepatitis C blood test. Hepatitis B blood test. Sexually transmitted disease (STD) testing. Diabetes screening. This is done by checking your blood sugar (glucose) after you have not eaten for a while (fasting). You may have this done every 1-3 years. Bone density scan. This is done to screen for osteoporosis. You may have this done starting at age 84. Mammogram. This may be done every 1-2 years. Talk to your health care provider about how often you should have regular mammograms. Talk with your health care provider about your test results, treatment options, and if necessary, the need for more tests. Vaccines  Your health care provider may recommend certain vaccines, such as: Influenza vaccine. This is recommended every year. Tetanus, diphtheria, and acellular pertussis (Tdap, Td) vaccine. You may need a Td booster every 10 years. Zoster vaccine. You may need this after age 84. Pneumococcal 13-valent conjugate (PCV13) vaccine. One dose is recommended after age 84. Pneumococcal polysaccharide (PPSV23) vaccine. One dose is recommended after age 84. Talk to your health care provider about which  screenings and vaccines you need and how often you need them. This information is  not intended to replace advice given to you by your health care provider. Make sure you discuss any questions you have with your health care provider. Document Released: 12/22/2015 Document Revised: 08/14/2016 Document Reviewed: 09/26/2015 Elsevier Interactive Patient Education  2017 [place] Prevention in the Home Falls can cause injuries. They can happen to people of all ages. There are many things you can do to make your home safe and to help prevent falls. What can I do on the outside of my home? Regularly fix the edges of walkways and driveways and fix any cracks. Remove anything that might make you trip as you walk through a door, such as a raised step or threshold. Trim any bushes or trees on the path to your home. Use bright outdoor lighting. Clear any walking paths of anything that might make someone trip, such as rocks or tools. Regularly check to see if handrails are loose or broken. Make sure that both sides of any steps have handrails. Any raised decks and porches should have guardrails on the edges. Have any leaves, snow, or ice cleared regularly. Use sand or salt on walking paths during winter. Clean up any spills in your garage right away. This includes oil or grease spills. What can I do in the bathroom? Use night lights. Install grab bars by the toilet and in the tub and shower. Do not use towel bars as grab bars. Use non-skid mats or decals in the tub or shower. If you need to sit down in the shower, use a plastic, non-slip stool. Keep the floor dry. Clean up any water that spills on the floor as soon as it happens. Remove soap buildup in the tub or shower regularly. Attach bath mats securely with double-sided non-slip rug tape. Do not have throw rugs and other things on the floor that can make you trip. What can I do in the bedroom? Use night lights. Make sure that you have a light by your bed that is easy to reach. Do not use any sheets or blankets that  are too big for your bed. They should not hang down onto the floor. Have a firm chair that has side arms. You can use this for support while you get dressed. Do not have throw rugs and other things on the floor that can make you trip. What can I do in the kitchen? Clean up any spills right away. Avoid walking on wet floors. Keep items that you use a lot in easy-to-reach places. If you need to reach something above you, use a strong step stool that has a grab bar. Keep electrical cords out of the way. Do not use floor polish or wax that makes floors slippery. If you must use wax, use non-skid floor wax. Do not have throw rugs and other things on the floor that can make you trip. What can I do with my stairs? Do not leave any items on the stairs. Make sure that there are handrails on both sides of the stairs and use them. Fix handrails that are broken or loose. Make sure that handrails are as long as the stairways. Check any carpeting to make sure that it is firmly attached to the stairs. Fix any carpet that is loose or worn. Avoid having throw rugs at the top or bottom of the stairs. If you do have throw rugs, attach them to the floor with carpet  tape. Make sure that you have a light switch at the top of the stairs and the bottom of the stairs. If you do not have them, ask someone to add them for you. What else can I do to help prevent falls? Wear shoes that: Do not have high heels. Have rubber bottoms. Are comfortable and fit you well. Are closed at the toe. Do not wear sandals. If you use a stepladder: Make sure that it is fully opened. Do not climb a closed stepladder. Make sure that both sides of the stepladder are locked into place. Ask someone to hold it for you, if possible. Clearly mark and make sure that you can see: Any grab bars or handrails. First and last steps. Where the edge of each step is. Use tools that help you move around (mobility aids) if they are needed. These  include: Canes. Walkers. Scooters. Crutches. Turn on the lights when you go into a dark area. Replace any light bulbs as soon as they burn out. Set up your furniture so you have a clear path. Avoid moving your furniture around. If any of your floors are uneven, fix them. If there are any pets around you, be aware of where they are. Review your medicines with your doctor. Some medicines can make you feel dizzy. This can increase your chance of falling. Ask your doctor what other things that you can do to help prevent falls. This information is not intended to replace advice given to you by your health care provider. Make sure you discuss any questions you have with your health care provider. Document Released: 09/21/2009 Document Revised: 05/02/2016 Document Reviewed: 12/30/2014 Elsevier Interactive Patient Education  2017 [employer].

## 2021-12-21 DIAGNOSIS — Z791 Long term (current) use of non-steroidal anti-inflammatories (NSAID): Secondary | ICD-10-CM | POA: Diagnosis not present

## 2021-12-21 DIAGNOSIS — M159 Polyosteoarthritis, unspecified: Secondary | ICD-10-CM | POA: Diagnosis not present

## 2021-12-21 DIAGNOSIS — E785 Hyperlipidemia, unspecified: Secondary | ICD-10-CM | POA: Diagnosis not present

## 2021-12-21 DIAGNOSIS — T84021D Dislocation of internal left hip prosthesis, subsequent encounter: Secondary | ICD-10-CM | POA: Diagnosis not present

## 2021-12-21 DIAGNOSIS — I10 Essential (primary) hypertension: Secondary | ICD-10-CM | POA: Diagnosis not present

## 2021-12-21 DIAGNOSIS — H8113 Benign paroxysmal vertigo, bilateral: Secondary | ICD-10-CM | POA: Diagnosis not present

## 2021-12-21 DIAGNOSIS — F419 Anxiety disorder, unspecified: Secondary | ICD-10-CM

## 2021-12-21 DIAGNOSIS — K219 Gastro-esophageal reflux disease without esophagitis: Secondary | ICD-10-CM | POA: Diagnosis not present

## 2021-12-21 DIAGNOSIS — Z8616 Personal history of COVID-19: Secondary | ICD-10-CM | POA: Diagnosis not present

## 2021-12-21 DIAGNOSIS — F418 Other specified anxiety disorders: Secondary | ICD-10-CM

## 2021-12-21 DIAGNOSIS — E890 Postprocedural hypothyroidism: Secondary | ICD-10-CM | POA: Diagnosis not present

## 2021-12-21 DIAGNOSIS — E440 Moderate protein-calorie malnutrition: Secondary | ICD-10-CM | POA: Diagnosis not present

## 2021-12-21 DIAGNOSIS — Z9181 History of falling: Secondary | ICD-10-CM | POA: Diagnosis not present

## 2021-12-21 NOTE — Telephone Encounter (Signed)
Form completed and placed in basket  

## 2021-12-24 ENCOUNTER — Telehealth: Payer: Self-pay

## 2021-12-24 ENCOUNTER — Ambulatory Visit: Payer: Medicare Other

## 2021-12-24 DIAGNOSIS — K219 Gastro-esophageal reflux disease without esophagitis: Secondary | ICD-10-CM | POA: Diagnosis not present

## 2021-12-24 DIAGNOSIS — E890 Postprocedural hypothyroidism: Secondary | ICD-10-CM | POA: Diagnosis not present

## 2021-12-24 DIAGNOSIS — M159 Polyosteoarthritis, unspecified: Secondary | ICD-10-CM | POA: Diagnosis not present

## 2021-12-24 DIAGNOSIS — E785 Hyperlipidemia, unspecified: Secondary | ICD-10-CM | POA: Diagnosis not present

## 2021-12-24 DIAGNOSIS — Z8616 Personal history of COVID-19: Secondary | ICD-10-CM | POA: Diagnosis not present

## 2021-12-24 DIAGNOSIS — I10 Essential (primary) hypertension: Secondary | ICD-10-CM | POA: Diagnosis not present

## 2021-12-24 DIAGNOSIS — T84021D Dislocation of internal left hip prosthesis, subsequent encounter: Secondary | ICD-10-CM | POA: Diagnosis not present

## 2021-12-24 DIAGNOSIS — E440 Moderate protein-calorie malnutrition: Secondary | ICD-10-CM | POA: Diagnosis not present

## 2021-12-24 DIAGNOSIS — H8113 Benign paroxysmal vertigo, bilateral: Secondary | ICD-10-CM | POA: Diagnosis not present

## 2021-12-24 DIAGNOSIS — Z791 Long term (current) use of non-steroidal anti-inflammatories (NSAID): Secondary | ICD-10-CM | POA: Diagnosis not present

## 2021-12-24 DIAGNOSIS — Z9181 History of falling: Secondary | ICD-10-CM | POA: Diagnosis not present

## 2021-12-24 NOTE — Telephone Encounter (Signed)
Caller name:[name] with [facility])   On DPR? :Yes  Call back [phone] Provider they see: Birdie Riddle   Reason for call:Needs verbal for  2 week 3 pt for swallowing  issues

## 2021-12-24 NOTE — Telephone Encounter (Signed)
Tasha Moss pick up from the back and faxed to the # on the forms.    I have sent to charge entry and sent a copy to scan

## 2021-12-24 NOTE — Telephone Encounter (Signed)
Returned [employer] phone call

## 2021-12-25 ENCOUNTER — Telehealth: Payer: Self-pay | Admitting: Family Medicine

## 2021-12-25 DIAGNOSIS — Z8616 Personal history of COVID-19: Secondary | ICD-10-CM | POA: Diagnosis not present

## 2021-12-25 DIAGNOSIS — Z791 Long term (current) use of non-steroidal anti-inflammatories (NSAID): Secondary | ICD-10-CM | POA: Diagnosis not present

## 2021-12-25 DIAGNOSIS — K219 Gastro-esophageal reflux disease without esophagitis: Secondary | ICD-10-CM | POA: Diagnosis not present

## 2021-12-25 DIAGNOSIS — I10 Essential (primary) hypertension: Secondary | ICD-10-CM | POA: Diagnosis not present

## 2021-12-25 DIAGNOSIS — H8113 Benign paroxysmal vertigo, bilateral: Secondary | ICD-10-CM | POA: Diagnosis not present

## 2021-12-25 DIAGNOSIS — M159 Polyosteoarthritis, unspecified: Secondary | ICD-10-CM | POA: Diagnosis not present

## 2021-12-25 DIAGNOSIS — E785 Hyperlipidemia, unspecified: Secondary | ICD-10-CM | POA: Diagnosis not present

## 2021-12-25 DIAGNOSIS — Z9181 History of falling: Secondary | ICD-10-CM | POA: Diagnosis not present

## 2021-12-25 DIAGNOSIS — E440 Moderate protein-calorie malnutrition: Secondary | ICD-10-CM | POA: Diagnosis not present

## 2021-12-25 DIAGNOSIS — T84021D Dislocation of internal left hip prosthesis, subsequent encounter: Secondary | ICD-10-CM | POA: Diagnosis not present

## 2021-12-25 DIAGNOSIS — E890 Postprocedural hypothyroidism: Secondary | ICD-10-CM | POA: Diagnosis not present

## 2021-12-25 NOTE — Telephone Encounter (Signed)
Spoke to [place], gave verbals.

## 2021-12-25 NOTE — Telephone Encounter (Signed)
Orders from [facility] have been placed in the bin   Order [phone]

## 2021-12-26 ENCOUNTER — Other Ambulatory Visit: Payer: Self-pay

## 2021-12-26 DIAGNOSIS — K219 Gastro-esophageal reflux disease without esophagitis: Secondary | ICD-10-CM | POA: Diagnosis not present

## 2021-12-26 DIAGNOSIS — Z8616 Personal history of COVID-19: Secondary | ICD-10-CM | POA: Diagnosis not present

## 2021-12-26 DIAGNOSIS — E890 Postprocedural hypothyroidism: Secondary | ICD-10-CM | POA: Diagnosis not present

## 2021-12-26 DIAGNOSIS — M159 Polyosteoarthritis, unspecified: Secondary | ICD-10-CM | POA: Diagnosis not present

## 2021-12-26 DIAGNOSIS — Z9181 History of falling: Secondary | ICD-10-CM | POA: Diagnosis not present

## 2021-12-26 DIAGNOSIS — H8113 Benign paroxysmal vertigo, bilateral: Secondary | ICD-10-CM | POA: Diagnosis not present

## 2021-12-26 DIAGNOSIS — E785 Hyperlipidemia, unspecified: Secondary | ICD-10-CM | POA: Diagnosis not present

## 2021-12-26 DIAGNOSIS — D649 Anemia, unspecified: Secondary | ICD-10-CM

## 2021-12-26 DIAGNOSIS — T84021D Dislocation of internal left hip prosthesis, subsequent encounter: Secondary | ICD-10-CM | POA: Diagnosis not present

## 2021-12-26 DIAGNOSIS — I10 Essential (primary) hypertension: Secondary | ICD-10-CM | POA: Diagnosis not present

## 2021-12-26 DIAGNOSIS — Z791 Long term (current) use of non-steroidal anti-inflammatories (NSAID): Secondary | ICD-10-CM | POA: Diagnosis not present

## 2021-12-26 DIAGNOSIS — E440 Moderate protein-calorie malnutrition: Secondary | ICD-10-CM | POA: Diagnosis not present

## 2021-12-26 NOTE — Telephone Encounter (Signed)
Form completed and placed in basket  

## 2021-12-27 ENCOUNTER — Other Ambulatory Visit: Payer: Self-pay | Admitting: Nurse Practitioner

## 2021-12-27 ENCOUNTER — Ambulatory Visit (HOSPITAL_COMMUNITY): Admission: RE | Admit: 2021-12-27 | Payer: Medicare Other | Source: Ambulatory Visit

## 2021-12-27 ENCOUNTER — Other Ambulatory Visit: Payer: Self-pay

## 2021-12-27 ENCOUNTER — Ambulatory Visit (HOSPITAL_COMMUNITY)
Admission: RE | Admit: 2021-12-27 | Discharge: 2021-12-27 | Disposition: A | Discharge status: Home / Self Care | Payer: Medicare Other | Source: Ambulatory Visit | Attending: Nurse Practitioner | Admitting: Nurse Practitioner

## 2021-12-27 DIAGNOSIS — D649 Anemia, unspecified: Secondary | ICD-10-CM | POA: Diagnosis not present

## 2021-12-27 DIAGNOSIS — R634 Abnormal weight loss: Secondary | ICD-10-CM | POA: Diagnosis not present

## 2021-12-27 DIAGNOSIS — R1310 Dysphagia, unspecified: Secondary | ICD-10-CM

## 2021-12-27 DIAGNOSIS — T17320A Food in larynx causing asphyxiation, initial encounter: Secondary | ICD-10-CM | POA: Diagnosis not present

## 2021-12-28 DIAGNOSIS — Z791 Long term (current) use of non-steroidal anti-inflammatories (NSAID): Secondary | ICD-10-CM | POA: Diagnosis not present

## 2021-12-28 DIAGNOSIS — Z8616 Personal history of COVID-19: Secondary | ICD-10-CM | POA: Diagnosis not present

## 2021-12-28 DIAGNOSIS — E440 Moderate protein-calorie malnutrition: Secondary | ICD-10-CM | POA: Diagnosis not present

## 2021-12-28 DIAGNOSIS — M159 Polyosteoarthritis, unspecified: Secondary | ICD-10-CM | POA: Diagnosis not present

## 2021-12-28 DIAGNOSIS — T84021D Dislocation of internal left hip prosthesis, subsequent encounter: Secondary | ICD-10-CM | POA: Diagnosis not present

## 2021-12-28 DIAGNOSIS — H8113 Benign paroxysmal vertigo, bilateral: Secondary | ICD-10-CM | POA: Diagnosis not present

## 2021-12-28 DIAGNOSIS — Z9181 History of falling: Secondary | ICD-10-CM | POA: Diagnosis not present

## 2021-12-28 DIAGNOSIS — E785 Hyperlipidemia, unspecified: Secondary | ICD-10-CM | POA: Diagnosis not present

## 2021-12-28 DIAGNOSIS — I10 Essential (primary) hypertension: Secondary | ICD-10-CM | POA: Diagnosis not present

## 2021-12-28 DIAGNOSIS — E890 Postprocedural hypothyroidism: Secondary | ICD-10-CM | POA: Diagnosis not present

## 2021-12-28 DIAGNOSIS — K219 Gastro-esophageal reflux disease without esophagitis: Secondary | ICD-10-CM | POA: Diagnosis not present

## 2021-12-31 ENCOUNTER — Telehealth: Payer: Self-pay

## 2021-12-31 DIAGNOSIS — M159 Polyosteoarthritis, unspecified: Secondary | ICD-10-CM | POA: Diagnosis not present

## 2021-12-31 DIAGNOSIS — Z8616 Personal history of COVID-19: Secondary | ICD-10-CM | POA: Diagnosis not present

## 2021-12-31 DIAGNOSIS — E890 Postprocedural hypothyroidism: Secondary | ICD-10-CM | POA: Diagnosis not present

## 2021-12-31 DIAGNOSIS — T84021D Dislocation of internal left hip prosthesis, subsequent encounter: Secondary | ICD-10-CM | POA: Diagnosis not present

## 2021-12-31 DIAGNOSIS — E785 Hyperlipidemia, unspecified: Secondary | ICD-10-CM | POA: Diagnosis not present

## 2021-12-31 DIAGNOSIS — E440 Moderate protein-calorie malnutrition: Secondary | ICD-10-CM | POA: Diagnosis not present

## 2021-12-31 DIAGNOSIS — H8113 Benign paroxysmal vertigo, bilateral: Secondary | ICD-10-CM | POA: Diagnosis not present

## 2021-12-31 DIAGNOSIS — K222 Esophageal obstruction: Secondary | ICD-10-CM

## 2021-12-31 DIAGNOSIS — K219 Gastro-esophageal reflux disease without esophagitis: Secondary | ICD-10-CM | POA: Diagnosis not present

## 2021-12-31 DIAGNOSIS — I10 Essential (primary) hypertension: Secondary | ICD-10-CM | POA: Diagnosis not present

## 2021-12-31 DIAGNOSIS — Z9181 History of falling: Secondary | ICD-10-CM | POA: Diagnosis not present

## 2021-12-31 DIAGNOSIS — Z791 Long term (current) use of non-steroidal anti-inflammatories (NSAID): Secondary | ICD-10-CM | POA: Diagnosis not present

## 2021-12-31 NOTE — Telephone Encounter (Signed)
Caller name:Judy with [facility]   On DPR? :Yes  Call back [phone]  Provider they see: Birdie Riddle   Reason for call:Judy with [facility]  Needs a modified barium swallow study place fax order [phone] to [facility]

## 2022-01-01 NOTE — Telephone Encounter (Signed)
Sent!

## 2022-01-01 NOTE — Telephone Encounter (Signed)
Order entered as requested and location specified as [facility]

## 2022-01-01 NOTE — Addendum Note (Signed)
Addended by: [name] on: 01/01/2022 07:40 AM   Modules accepted: Orders

## 2022-01-02 ENCOUNTER — Other Ambulatory Visit (HOSPITAL_COMMUNITY): Payer: Self-pay | Admitting: Specialist

## 2022-01-02 DIAGNOSIS — R1310 Dysphagia, unspecified: Secondary | ICD-10-CM

## 2022-01-02 DIAGNOSIS — K222 Esophageal obstruction: Secondary | ICD-10-CM

## 2022-01-02 DIAGNOSIS — T17908S Unspecified foreign body in respiratory tract, part unspecified causing other injury, sequela: Secondary | ICD-10-CM

## 2022-01-03 ENCOUNTER — Other Ambulatory Visit: Payer: Medicare Other

## 2022-01-03 DIAGNOSIS — M159 Polyosteoarthritis, unspecified: Secondary | ICD-10-CM | POA: Diagnosis not present

## 2022-01-03 DIAGNOSIS — T84021D Dislocation of internal left hip prosthesis, subsequent encounter: Secondary | ICD-10-CM | POA: Diagnosis not present

## 2022-01-03 DIAGNOSIS — Z8616 Personal history of COVID-19: Secondary | ICD-10-CM | POA: Diagnosis not present

## 2022-01-03 DIAGNOSIS — E440 Moderate protein-calorie malnutrition: Secondary | ICD-10-CM | POA: Diagnosis not present

## 2022-01-03 DIAGNOSIS — H8113 Benign paroxysmal vertigo, bilateral: Secondary | ICD-10-CM | POA: Diagnosis not present

## 2022-01-03 DIAGNOSIS — E890 Postprocedural hypothyroidism: Secondary | ICD-10-CM | POA: Diagnosis not present

## 2022-01-03 DIAGNOSIS — E785 Hyperlipidemia, unspecified: Secondary | ICD-10-CM | POA: Diagnosis not present

## 2022-01-03 DIAGNOSIS — I10 Essential (primary) hypertension: Secondary | ICD-10-CM | POA: Diagnosis not present

## 2022-01-03 DIAGNOSIS — Z9181 History of falling: Secondary | ICD-10-CM | POA: Diagnosis not present

## 2022-01-03 DIAGNOSIS — K219 Gastro-esophageal reflux disease without esophagitis: Secondary | ICD-10-CM | POA: Diagnosis not present

## 2022-01-03 DIAGNOSIS — Z791 Long term (current) use of non-steroidal anti-inflammatories (NSAID): Secondary | ICD-10-CM | POA: Diagnosis not present

## 2022-01-04 ENCOUNTER — Other Ambulatory Visit: Payer: Medicare Other

## 2022-01-06 ENCOUNTER — Other Ambulatory Visit: Payer: Self-pay | Admitting: Adult Health

## 2022-01-07 ENCOUNTER — Emergency Department (HOSPITAL_COMMUNITY): Payer: Medicare Other

## 2022-01-07 ENCOUNTER — Encounter (HOSPITAL_COMMUNITY): Payer: Self-pay | Admitting: Emergency Medicine

## 2022-01-07 ENCOUNTER — Telehealth (INDEPENDENT_AMBULATORY_CARE_PROVIDER_SITE_OTHER): Payer: Medicare Other | Admitting: Family Medicine

## 2022-01-07 ENCOUNTER — Inpatient Hospital Stay (HOSPITAL_COMMUNITY)
Admission: EM | Admit: 2022-01-07 | Discharge: 2022-01-23 | DRG: 391 | Disposition: A | Discharge status: Home Health | Payer: Medicare Other | Attending: Internal Medicine | Admitting: Internal Medicine

## 2022-01-07 ENCOUNTER — Other Ambulatory Visit: Payer: Self-pay

## 2022-01-07 DIAGNOSIS — E890 Postprocedural hypothyroidism: Secondary | ICD-10-CM | POA: Diagnosis not present

## 2022-01-07 DIAGNOSIS — L03317 Cellulitis of buttock: Secondary | ICD-10-CM | POA: Diagnosis not present

## 2022-01-07 DIAGNOSIS — I110 Hypertensive heart disease with heart failure: Secondary | ICD-10-CM | POA: Diagnosis not present

## 2022-01-07 DIAGNOSIS — K2100 Gastro-esophageal reflux disease with esophagitis, without bleeding: Secondary | ICD-10-CM | POA: Diagnosis present

## 2022-01-07 DIAGNOSIS — I5041 Acute combined systolic (congestive) and diastolic (congestive) heart failure: Secondary | ICD-10-CM | POA: Diagnosis not present

## 2022-01-07 DIAGNOSIS — R1312 Dysphagia, oropharyngeal phase: Secondary | ICD-10-CM | POA: Diagnosis not present

## 2022-01-07 DIAGNOSIS — E440 Moderate protein-calorie malnutrition: Secondary | ICD-10-CM | POA: Diagnosis not present

## 2022-01-07 DIAGNOSIS — E871 Hypo-osmolality and hyponatremia: Secondary | ICD-10-CM | POA: Diagnosis not present

## 2022-01-07 DIAGNOSIS — K449 Diaphragmatic hernia without obstruction or gangrene: Secondary | ICD-10-CM | POA: Diagnosis present

## 2022-01-07 DIAGNOSIS — R638 Other symptoms and signs concerning food and fluid intake: Secondary | ICD-10-CM

## 2022-01-07 DIAGNOSIS — K2970 Gastritis, unspecified, without bleeding: Secondary | ICD-10-CM | POA: Diagnosis not present

## 2022-01-07 DIAGNOSIS — R918 Other nonspecific abnormal finding of lung field: Secondary | ICD-10-CM

## 2022-01-07 DIAGNOSIS — R627 Adult failure to thrive: Secondary | ICD-10-CM | POA: Diagnosis present

## 2022-01-07 DIAGNOSIS — H8193 Unspecified disorder of vestibular function, bilateral: Secondary | ICD-10-CM

## 2022-01-07 DIAGNOSIS — D638 Anemia in other chronic diseases classified elsewhere: Secondary | ICD-10-CM | POA: Diagnosis not present

## 2022-01-07 DIAGNOSIS — D721 Eosinophilia, unspecified: Secondary | ICD-10-CM | POA: Diagnosis not present

## 2022-01-07 DIAGNOSIS — R634 Abnormal weight loss: Secondary | ICD-10-CM | POA: Diagnosis not present

## 2022-01-07 DIAGNOSIS — H8110 Benign paroxysmal vertigo, unspecified ear: Secondary | ICD-10-CM | POA: Diagnosis not present

## 2022-01-07 DIAGNOSIS — I5031 Acute diastolic (congestive) heart failure: Secondary | ICD-10-CM | POA: Diagnosis present

## 2022-01-07 DIAGNOSIS — E785 Hyperlipidemia, unspecified: Secondary | ICD-10-CM | POA: Diagnosis not present

## 2022-01-07 DIAGNOSIS — E039 Hypothyroidism, unspecified: Secondary | ICD-10-CM | POA: Diagnosis not present

## 2022-01-07 DIAGNOSIS — Z79899 Other long term (current) drug therapy: Secondary | ICD-10-CM | POA: Diagnosis not present

## 2022-01-07 DIAGNOSIS — Z7189 Other specified counseling: Secondary | ICD-10-CM | POA: Diagnosis not present

## 2022-01-07 DIAGNOSIS — R6339 Other feeding difficulties: Secondary | ICD-10-CM | POA: Diagnosis not present

## 2022-01-07 DIAGNOSIS — E02 Subclinical iodine-deficiency hypothyroidism: Secondary | ICD-10-CM | POA: Diagnosis not present

## 2022-01-07 DIAGNOSIS — Z833 Family history of diabetes mellitus: Secondary | ICD-10-CM | POA: Diagnosis not present

## 2022-01-07 DIAGNOSIS — R1310 Dysphagia, unspecified: Secondary | ICD-10-CM | POA: Diagnosis not present

## 2022-01-07 DIAGNOSIS — Z96612 Presence of left artificial shoulder joint: Secondary | ICD-10-CM | POA: Diagnosis present

## 2022-01-07 DIAGNOSIS — Z800 Family history of malignant neoplasm of digestive organs: Secondary | ICD-10-CM

## 2022-01-07 DIAGNOSIS — K3189 Other diseases of stomach and duodenum: Secondary | ICD-10-CM | POA: Diagnosis not present

## 2022-01-07 DIAGNOSIS — L89152 Pressure ulcer of sacral region, stage 2: Secondary | ICD-10-CM | POA: Diagnosis not present

## 2022-01-07 DIAGNOSIS — T17908A Unspecified foreign body in respiratory tract, part unspecified causing other injury, initial encounter: Secondary | ICD-10-CM | POA: Diagnosis not present

## 2022-01-07 DIAGNOSIS — Z8616 Personal history of COVID-19: Secondary | ICD-10-CM

## 2022-01-07 DIAGNOSIS — Z96643 Presence of artificial hip joint, bilateral: Secondary | ICD-10-CM | POA: Diagnosis present

## 2022-01-07 DIAGNOSIS — J9811 Atelectasis: Secondary | ICD-10-CM | POA: Diagnosis not present

## 2022-01-07 DIAGNOSIS — Z8249 Family history of ischemic heart disease and other diseases of the circulatory system: Secondary | ICD-10-CM

## 2022-01-07 DIAGNOSIS — Z79890 Hormone replacement therapy (postmenopausal): Secondary | ICD-10-CM

## 2022-01-07 DIAGNOSIS — R531 Weakness: Secondary | ICD-10-CM

## 2022-01-07 DIAGNOSIS — B370 Candidal stomatitis: Secondary | ICD-10-CM | POA: Diagnosis present

## 2022-01-07 DIAGNOSIS — R601 Generalized edema: Secondary | ICD-10-CM | POA: Diagnosis not present

## 2022-01-07 DIAGNOSIS — R1314 Dysphagia, pharyngoesophageal phase: Secondary | ICD-10-CM | POA: Diagnosis present

## 2022-01-07 DIAGNOSIS — H8113 Benign paroxysmal vertigo, bilateral: Secondary | ICD-10-CM | POA: Diagnosis not present

## 2022-01-07 DIAGNOSIS — E43 Unspecified severe protein-calorie malnutrition: Secondary | ICD-10-CM | POA: Diagnosis not present

## 2022-01-07 DIAGNOSIS — R933 Abnormal findings on diagnostic imaging of other parts of digestive tract: Secondary | ICD-10-CM | POA: Diagnosis not present

## 2022-01-07 DIAGNOSIS — K222 Esophageal obstruction: Principal | ICD-10-CM | POA: Diagnosis present

## 2022-01-07 DIAGNOSIS — R54 Age-related physical debility: Secondary | ICD-10-CM | POA: Diagnosis present

## 2022-01-07 DIAGNOSIS — R42 Dizziness and giddiness: Secondary | ICD-10-CM | POA: Diagnosis not present

## 2022-01-07 DIAGNOSIS — J90 Pleural effusion, not elsewhere classified: Secondary | ICD-10-CM | POA: Diagnosis not present

## 2022-01-07 DIAGNOSIS — Z6820 Body mass index (BMI) 20.0-20.9, adult: Secondary | ICD-10-CM

## 2022-01-07 DIAGNOSIS — I517 Cardiomegaly: Secondary | ICD-10-CM | POA: Diagnosis not present

## 2022-01-07 DIAGNOSIS — Z515 Encounter for palliative care: Secondary | ICD-10-CM | POA: Diagnosis not present

## 2022-01-07 DIAGNOSIS — K5730 Diverticulosis of large intestine without perforation or abscess without bleeding: Secondary | ICD-10-CM | POA: Diagnosis not present

## 2022-01-07 DIAGNOSIS — I509 Heart failure, unspecified: Secondary | ICD-10-CM | POA: Diagnosis not present

## 2022-01-07 DIAGNOSIS — I10 Essential (primary) hypertension: Secondary | ICD-10-CM | POA: Diagnosis not present

## 2022-01-07 DIAGNOSIS — R5383 Other fatigue: Secondary | ICD-10-CM | POA: Diagnosis not present

## 2022-01-07 DIAGNOSIS — Z823 Family history of stroke: Secondary | ICD-10-CM

## 2022-01-07 DIAGNOSIS — E278 Other specified disorders of adrenal gland: Secondary | ICD-10-CM | POA: Diagnosis not present

## 2022-01-07 LAB — TROPONIN I (HIGH SENSITIVITY)
Troponin I (High Sensitivity): 6 ng/L (ref <18)
Troponin I (High Sensitivity): 6 ng/L (ref <18)

## 2022-01-07 LAB — COMPREHENSIVE METABOLIC PANEL
ALT: 18 U/L (ref 0–44)
AST: 23 U/L (ref 15–41)
Albumin: 3.4 g/dL — ABNORMAL LOW (ref 3.5–5.0)
Alkaline Phosphatase: 60 U/L (ref 38–126)
Anion gap: 7 (ref 5–15)
BUN: 10 mg/dL (ref 8–23)
CO2: 30 mmol/L (ref 22–32)
Calcium: 8.7 mg/dL — ABNORMAL LOW (ref 8.9–10.3)
Chloride: 95 mmol/L — ABNORMAL LOW (ref 98–111)
Creatinine, Ser: 0.55 mg/dL (ref 0.44–1.00)
GFR, Estimated: >60 mL/min (ref >60)
Glucose, Bld: 123 mg/dL — ABNORMAL HIGH (ref 70–99)
Potassium: 4.4 mmol/L (ref 3.5–5.1)
Sodium: 132 mmol/L — ABNORMAL LOW (ref 135–145)
Total Bilirubin: 0.4 mg/dL (ref 0.3–1.2)
Total Protein: 6.7 g/dL (ref 6.5–8.1)

## 2022-01-07 LAB — CBC WITH DIFFERENTIAL/PLATELET
Abs Immature Granulocytes: 0.03 10*3/uL (ref 0.00–0.07)
Basophils Absolute: 0 10*3/uL (ref 0.0–0.1)
Basophils Relative: 1 %
Eosinophils Absolute: 0.2 10*3/uL (ref 0.0–0.5)
Eosinophils Relative: 3 %
HCT: 40.7 % (ref 36.0–46.0)
Hemoglobin: 13.7 g/dL (ref 12.0–15.0)
Immature Granulocytes: 1 %
Lymphocytes Relative: 15 %
Lymphs Abs: 1 10*3/uL (ref 0.7–4.0)
MCH: 31.1 pg (ref 26.0–34.0)
MCHC: 33.7 g/dL (ref 30.0–36.0)
MCV: 92.3 fL (ref 80.0–100.0)
Monocytes Absolute: 0.6 10*3/uL (ref 0.1–1.0)
Monocytes Relative: 9 %
Neutro Abs: 4.6 10*3/uL (ref 1.7–7.7)
Neutrophils Relative %: 71 %
Platelets: 278 10*3/uL (ref 150–400)
RBC: 4.41 MIL/uL (ref 3.87–5.11)
RDW: 14.9 % (ref 11.5–15.5)
WBC: 6.4 10*3/uL (ref 4.0–10.5)
nRBC: 0 % (ref 0.0–0.2)

## 2022-01-07 LAB — URINALYSIS, ROUTINE W REFLEX MICROSCOPIC
Bilirubin Urine: NEGATIVE
Glucose, UA: NEGATIVE mg/dL
Hgb urine dipstick: NEGATIVE
Ketones, ur: NEGATIVE mg/dL
Nitrite: NEGATIVE
Protein, ur: NEGATIVE mg/dL
Specific Gravity, Urine: 1.018 (ref 1.005–1.030)
pH: 8 (ref 5.0–8.0)

## 2022-01-07 LAB — RESP PANEL BY RT-PCR (FLU A&B, COVID) ARPGX2
Influenza A by PCR: NEGATIVE
Influenza B by PCR: NEGATIVE
SARS Coronavirus 2 by RT PCR: NEGATIVE

## 2022-01-07 LAB — BRAIN NATRIURETIC PEPTIDE: B Natriuretic Peptide: 113.5 pg/mL — ABNORMAL HIGH (ref 0.0–100.0)

## 2022-01-07 LAB — PROTIME-INR
INR: 1 (ref 0.8–1.2)
Prothrombin Time: 12.6 seconds (ref 11.4–15.2)

## 2022-01-07 LAB — LACTIC ACID, PLASMA: Lactic Acid, Venous: 1.1 mmol/L (ref 0.5–1.9)

## 2022-01-07 MED ORDER — PANTOPRAZOLE SODIUM 40 MG PO TBEC
40.0000 mg | DELAYED_RELEASE_TABLET | Freq: Every day | ORAL | Status: DC
  Administered 2022-01-08 – 2022-01-16 (×7): 40 mg via ORAL
  Filled 2022-01-07 (×7): qty 1

## 2022-01-07 MED ORDER — CYANOCOBALAMIN 500 MCG PO TABS
250.0000 ug | ORAL_TABLET | Freq: Every day | ORAL | Status: DC
  Administered 2022-01-08 – 2022-01-23 (×13): 250 ug via ORAL
  Filled 2022-01-07 (×16): qty 1

## 2022-01-07 MED ORDER — FUROSEMIDE 10 MG/ML IJ SOLN: 40.0000 mg | Freq: Once | INTRAMUSCULAR | Status: DC

## 2022-01-07 MED ORDER — DIMENHYDRINATE 50 MG PO TABS
50.0000 mg | ORAL_TABLET | Freq: Four times a day (QID) | ORAL | Status: DC | PRN
  Filled 2022-01-07: qty 1

## 2022-01-07 MED ORDER — SODIUM CHLORIDE 0.9 % IV BOLUS
500.0000 mL | Freq: Once | INTRAVENOUS | Status: AC
Start: 2022-01-07 — End: 2022-01-07
  Administered 2022-01-07: 500 mL via INTRAVENOUS

## 2022-01-07 MED ORDER — ONDANSETRON HCL 4 MG PO TABS
8.0000 mg | ORAL_TABLET | Freq: Three times a day (TID) | ORAL | Status: DC | PRN
  Administered 2022-01-08 – 2022-01-09 (×3): 8 mg via ORAL
  Filled 2022-01-07 (×3): qty 2

## 2022-01-07 MED ORDER — ACETAMINOPHEN 500 MG PO TABS
500.0000 mg | ORAL_TABLET | Freq: Four times a day (QID) | ORAL | Status: DC | PRN
  Administered 2022-01-10 – 2022-01-23 (×12): 500 mg via ORAL
  Filled 2022-01-07 (×12): qty 1

## 2022-01-07 MED ORDER — SCOPOLAMINE 1 MG/3DAYS TD PT72
1.0000 | MEDICATED_PATCH | TRANSDERMAL | Status: DC
  Administered 2022-01-07 – 2022-01-22 (×6): 1.5 mg via TRANSDERMAL
  Filled 2022-01-07 (×6): qty 1

## 2022-01-07 MED ORDER — LEVOTHYROXINE SODIUM 25 MCG PO TABS
125.0000 ug | ORAL_TABLET | Freq: Every day | ORAL | Status: DC
  Administered 2022-01-08 – 2022-01-23 (×13): 125 ug via ORAL
  Filled 2022-01-07 (×13): qty 1

## 2022-01-07 MED ORDER — POLYVINYL ALCOHOL-POVIDONE 5-6 MG/ML OP SOLN: 2.0000 [drp] | Freq: Every day | OPHTHALMIC | Status: DC

## 2022-01-07 MED ORDER — POLYVINYL ALCOHOL 1.4 % OP SOLN
2.0000 [drp] | Freq: Every day | OPHTHALMIC | Status: DC
  Administered 2022-01-08 – 2022-01-23 (×15): 2 [drp] via OPHTHALMIC
  Filled 2022-01-07: qty 15

## 2022-01-07 MED ORDER — LORATADINE 10 MG PO TABS
10.0000 mg | ORAL_TABLET | Freq: Every day | ORAL | Status: DC
  Administered 2022-01-08 – 2022-01-23 (×13): 10 mg via ORAL
  Filled 2022-01-07 (×13): qty 1

## 2022-01-07 MED ORDER — FUROSEMIDE 10 MG/ML IJ SOLN
40.0000 mg | Freq: Every day | INTRAMUSCULAR | Status: DC
  Administered 2022-01-07: 40 mg via INTRAVENOUS
  Filled 2022-01-07: qty 4

## 2022-01-07 MED ORDER — ENOXAPARIN SODIUM 40 MG/0.4ML IJ SOSY
40.0000 mg | PREFILLED_SYRINGE | INTRAMUSCULAR | Status: DC
  Administered 2022-01-07 – 2022-01-22 (×15): 40 mg via SUBCUTANEOUS
  Filled 2022-01-07 (×15): qty 0.4

## 2022-01-07 MED ORDER — IOHEXOL 300 MG/ML  SOLN
100.0000 mL | Freq: Once | INTRAMUSCULAR | Status: AC | PRN
  Administered 2022-01-07: 100 mL via INTRAVENOUS

## 2022-01-07 MED ORDER — ENSURE ENLIVE PO LIQD
237.0000 mL | Freq: Two times a day (BID) | ORAL | Status: DC
  Administered 2022-01-08: 237 mL via ORAL

## 2022-01-07 MED ORDER — VITAMIN D3 25 MCG (1000 UNIT) PO TABS
1000.0000 [IU] | ORAL_TABLET | Freq: Every day | ORAL | Status: DC
  Administered 2022-01-08 – 2022-01-23 (×13): 1000 [IU] via ORAL
  Filled 2022-01-07 (×13): qty 1

## 2022-01-07 MED ORDER — FLUOXETINE HCL 20 MG PO CAPS
20.0000 mg | ORAL_CAPSULE | Freq: Every day | ORAL | Status: DC
  Administered 2022-01-08 – 2022-01-23 (×13): 20 mg via ORAL
  Filled 2022-01-07 (×13): qty 1

## 2022-01-07 NOTE — ED Triage Notes (Signed)
BIB daughter and husband, pt has been struggling w/ vertigo, N/V/D and constipation since after Thanksgiving. Decreased appetite. Zofran w/o relief, wearing scopolamine patches.

## 2022-01-07 NOTE — Progress Notes (Signed)
Virtual Visit via video Note  I connected with Tasha Moss on 01/07/22 at 4:33 PM by video application. and verified that I am speaking with the correct person using two identifiers.  Patient location: home, husband Jyasia Markoff - consent obtained  to discuss PHI.  My location: office - [place]   I discussed the limitations, risks, security and privacy concerns of performing an evaluation and management service by telephone and the availability of in person appointments. I also discussed with the patient that there may be a patient responsible charge related to this service. The patient expressed understanding and agreed to proceed, consent obtained  Chief complaint:  Chief Complaint  Patient presents with   Extremity Weakness    Pt husband reports she is dizzy and weak, reports has been on has progressively worsening over last 2 months, had dislocated hip over thanksgiving and was unable to go to rehab following but has been extremely weak since cannot go to bathroom alone any longer     History of Present Illness: Tasha Moss is a 86 y.o. female  Generalized weakness:  Complains of being dizzy, weak, bad taste in mouth, weight loss, stirred up.going on for awhile. Had improved, then worsened since taking iron - past week to 10 days - anemia in December - started on iron by Carl Best.  Bad taste since Covid infection in November 2022.  Weakness and shaky/tremulous past week.  No fever.  Less po intake recently due to bad taste - not much water.  Aspirated some barium with last swallow test - barium into trachea on 12/27/21. Some new cough since aspiration - 11 days ago. Occasional cough. No shortness of breath. Home health nurse has noted her lungs to be clear.  Requires assistance to go to the bathroom past 6-7days, able to do own prior. Afraid of falling due to weakness recently.  Different weakness  Dark stools with iron supplement only.   Reviewed note from  primary care provider Dr. Birdie Riddle on 12/07/2021.  Vestibular disorder with severe motion sickness, unable to participate in rehab due to ongoing dizziness with nausea and vomiting.  Previously treated with meclizine, started scopolamine patch.  Zofran if needed for nausea.  Referred back to vestibular rehab.  Started on MiraLAX for constipation and encouraged hydration.  With prior hip dislocation, plan for home health PT and speech. Plan for modified barium swallow study this Thursday.  History of esophageal stricture and stenosis.  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 ([place]) 11/06/2021   Elevated BP without diagnosis of hypertension 10/31/2020   Dizziness 10/31/2020   Status post reverse arthroplasty of left shoulder 03/11/2016   Depression with anxiety 03/11/2016   Routine general medical examination at a health care facility 06/29/2014   H/O thyroid nodule 06/21/2013   DIVERTICULOSIS, COLON 07/18/2010   GERD without esophagitis 04/09/2010   Stricture and stenosis of esophagus 02/21/2010   DYSPHAGIA 02/09/2010   Osteoarthritis 11/22/2009   BENIGN PAROXYSMAL POSITIONAL VERTIGO 05/15/2009   HTN (hypertension) 09/28/2008   SPINAL STENOSIS, LUMBAR 09/28/2008   PERNICIOUS ANEMIA 03/28/2008   Hypothyroidism 09/08/2007   HYPERLIPIDEMIA 09/08/2007   RESTLESS LEG SYNDROME 09/08/2007   HASHIMOTO'S THYROIDITIS 02/23/2007   Past Medical History:  Diagnosis Date  Allergy    seasonal & dust   Anxiety    Arthritis    DJD   Family history of adverse reaction to anesthesia    n/v    GERD (gastroesophageal reflux disease)    History of vertigo    Hyperlipidemia    Hypertension    Hypothyroidism    PONV  (postoperative nausea and vomiting)    Thyroid disease    hypothyroidism post Hashimoto's & partial thyroidectomy   Past Surgical History:  Procedure Laterality Date   CATARACT EXTRACTION W/ INTRAOCULAR LENS  IMPLANT, BILATERAL     COLONOSCOPY     diverticulosis   JOINT REPLACEMENT  1995 & 1999   THR bilaterally    lumbar disc repair  1989   Dr Arrie Aran   SPINE SURGERY     cervical ; Dr Vertell Limber   THYROIDECTOMY, PARTIAL     benign nodule   TONSILLECTOMY AND ADENOIDECTOMY     TOTAL SHOULDER ARTHROPLASTY Left 02/27/2016   Procedure: LEFT REVERSE TOTAL SHOULDER ARTHROPLASTY;  Surgeon: Renette Butters, MD;  Location: [facility];  Service: Orthopedics;  Laterality: Left;   vitreous detachment     Dr Claudean Kinds   Allergies  Allergen Reactions   Gabapentin     hallucinations   Other     Strong pain medications cause n/v   Morphine     Nausea & vomiting   Prior to Admission medications   Medication Sig Start Date End Date Taking? Authorizing Provider  acetaminophen (TYLENOL) 500 MG tablet Take 500 mg by mouth every 6 (six) hours as needed.   Yes [provider]  Cholecalciferol (VITAMIN D3) 1000 UNITS CAPS Take 1,000 Units by mouth daily.   Yes [provider]  Cranberry 200 MG CAPS Take 1 capsule by mouth daily.   Yes [provider]  dimenhyDRINATE (DRAMAMINE) 50 MG tablet Take 50 mg by mouth every 6 (six) hours as needed.   Yes [provider]  FLUoxetine (PROZAC) 20 MG capsule Take 1 capsule (20 mg total) by mouth daily. 12/04/21  Yes Gerlene Fee, NP  fluticasone (FLONASE) 50 MCG/ACT nasal spray Place 1 spray into both nostrils daily.   Yes [provider]  levothyroxine (SYNTHROID) 125 MCG tablet Take 1 tablet (125 mcg total) by mouth daily before breakfast. 12/04/21  Yes Gerlene Fee, NP  loratadine (CLARITIN) 10 MG tablet Take 10 mg by mouth daily.   Yes [provider]  meloxicam (MOBIC) 7.5 MG tablet Take 1 tablet  (7.5 mg total) by mouth 2 (two) times daily. 12/04/21  Yes Gerlene Fee, NP  Multiple Vitamin (MULTIVITAMIN PO) Take 400 mg by mouth daily. Therems Multivitamin   Yes [provider]  NON FORMULARY Diet: Dysphagia 3 diet with thin liquids, NAS   Yes [provider]  Nutritional Supplements (ENSURE ENLIVE PO) Take by mouth. BID due to poor meal intake 1-50% and for repletion of protein stores. Special Instructions: Prefers vanilla or strawberry flavor.   Yes [provider]  ondansetron (ZOFRAN) 8 MG tablet Take 1 tablet (8 mg total) by mouth 3 (three) times daily before meals. 12/04/21  Yes Gerlene Fee, NP  pantoprazole (PROTONIX) 40 MG tablet Take 1 tablet (40 mg total) by mouth daily. 12/04/21  Yes Gerlene Fee, NP  Polyvinyl Alcohol-Povidone (ARTIFICIAL TEARS) 5-6 MG/ML SOLN Place 2 drops into both eyes daily.   Yes [provider]  scopolamine (TRANSDERM-SCOP) 1 MG/3DAYS Place 1 patch (1.5 mg total)  onto the skin every 3 (three) days. 12/07/21  Yes [name], MD  vitamin B-12 (CYANOCOBALAMIN) 250 MCG tablet Take 250 mcg by mouth daily.   Yes [provider]  chlorhexidine (PERIDEX) 0.12 % solution Use as directed 15 mLs in the mouth or throat 2 (two) times daily. Patient not taking: Reported on 12/20/2021 04/13/20   [provider]  diclofenac Sodium (VOLTAREN) 1 % GEL Apply 2 g topically 4 (four) times daily as needed (Pain). Patient not taking: Reported on 12/20/2021 12/04/21   Gerlene Fee, NP  polyethylene glycol powder (GLYCOLAX/MIRALAX) 17 GM/SCOOP powder Take 17 g by mouth 2 (two) times daily as needed. Patient not taking: Reported on 12/20/2021 12/07/21   [name], MD   Social History   Socioeconomic History   Marital status: Married    Spouse name: Not on file   Number of children: 2   Years of education: Not on file   Highest education level: Not on file  Occupational History   Not on file   Tobacco Use   Smoking status: Never   Smokeless tobacco: Never  Vaping Use   Vaping Use: Never used  Substance and Sexual Activity   Alcohol use: No   Drug use: No   Sexual activity: Not on file  Other Topics Concern   Not on file  Social History Narrative   Married for 65 years    Daughter-nurse   Daughter- paralegal       Right handed   Social Determinants of Health   Financial Resource Strain: Low Risk    Difficulty of Paying Living Expenses: Not hard at all  Food Insecurity: No Food Insecurity   Worried About Charity fundraiser in the Last Year: Never true   [place] in the Last Year: Never true  Transportation Needs: No Transportation Needs   Lack of Transportation (Medical): No   Lack of Transportation (Non-Medical): No  Physical Activity: Inactive   Days of Exercise per Week: 0 days   Minutes of Exercise per Session: 0 min  Stress: No Stress Concern Present   Feeling of Stress : Not at all  Social Connections: Moderately Integrated   Frequency of Communication with Friends and Family: Twice a week   Frequency of Social Gatherings with Friends and Family: Twice a week   Attends Religious Services: More than 4 times per year   Active Member of [employer] or Organizations: No   Attends Archivist Meetings: Never   Marital Status: Married  Human resources officer Violence: Not At Risk   Fear of Current or Ex-Partner: No   Emotionally Abused: No   Physically Abused: No   Sexually Abused: No    Observations/Objective: There were no vitals filed for this visit. Nontoxic appearance on video, appears fatigued but nontoxic.  Speaking in full sentences.  Coherent responses, but did repeat questions a few times.  No apparent respiratory distress.  Additional history provided by spouse.   Lab Results  Component Value Date   WBC 6.9 12/18/2021   HGB 12.7 12/18/2021   HCT 38.9 12/18/2021   MCV 92.4 12/18/2021   PLT 254.0 12/18/2021  Iron low at 36, TIBC low  at 223.  Percent sat 16.  Ferritin 234.    Lab Results  Component Value Date   NA 140 12/07/2021   K 4.3 12/07/2021   CL 102 12/07/2021   CO2 30 12/07/2021     Assessment and Plan: Vestibular disorder,  bilateral  General weakness  Fatigue, unspecified type  Decreased oral intake  Aspiration into airway, initial encounter  Chronic vertigo, esophageal stricture and stenosis, but now with more recent decline in function, progressive weakness over the past week, inability to ambulate to restroom on her own.  Decreased p.o. intake may be contributing.  Cough since aspiration of barium.  Differential includes infection versus electrolyte imbalance, including prerenal azotemia/volume depletion versus infection as above.  ER evaluation recommended today for further testing and in person evaluation.  Understanding expressed.  Plan was discussed with patient's primary care provider as well who is in agreement with ER eval.  All questions were answered.  Follow Up Instructions: To [facility]   I discussed the assessment and treatment plan with the patient. The patient was provided an opportunity to ask questions and all were answered. The patient agreed with the plan and demonstrated an understanding of the instructions.   The patient was advised to call back or seek an in-person evaluation if the symptoms worsen or if the condition fails to improve as anticipated.  Wendie Agreste, MD

## 2022-01-07 NOTE — H&P (Signed)
History and Physical    XITLALY AULT ALP:[id number] DOB: 1936-05-19 DOA: 01/07/2022  PCP: [name], MD  Patient coming from: Home  I have personally briefly reviewed patient's old medical records in [place]  Chief Complaint: weakness  HPI: Tasha Moss is a 86 y.o. female with medical history significant for BPPV, hypertension, hyperlipidemia, hypothyroidism s/p partial thyroidectomy, GERD, esophageal stricture who presents with weakness.  History mostly obtained from husband and daughter at bedside. Patient was hospitalized back in November with a dislocated left total hip arthroplasty status post reduction and was discharged to SNF.  Also contracted COVID in rehab.  She subsequently developed worsening vertigo with nausea and vomiting thought to be due to bilateral vestibular disorder.  She also has progressive worsening dysphagia and issues with passage of solid food. She was referred to GI and had barium swallow on 1/19  but aspirated contrast. Now has modified barium swallow planned at [facility] later this week.   She continues to have dizziness with her vertigo with nausea and vomiting. Decrease intake of food and fluids. Has been coughing. She has become so weak that she can no longer get up without assistance.  Also has chonic LE edema that is now worse. No chest pain or shortness of breath.   ED Course: She was afebrile, mildly hypertensive up to 160/67 on room air.CBC unremarkable.  Sodium of 132, K of 4.4, BG of 123, creatinine of 0.55.  Normal LFTs.  CT head negative.  CT abdomen pelvis negative.  CT chest showed evidence of CHF with cardiomegaly, pulmonary vascular prominence, trace pleural effusion and interstitial edema. BNP of 113, troponin of 6.  Review of Systems:  Unable to obtain for ROS since patient is hard of hearing and limited historian.  Social Patient lives with her husband.  Previously mostly independent.  Now requiring assistance  with transfers and ambulation.  Denies tobacco, alcohol illicit drug use.  Past Medical History:  Diagnosis Date   Allergy    seasonal & dust   Anxiety    Arthritis    DJD   Family history of adverse reaction to anesthesia    n/v    GERD (gastroesophageal reflux disease)    History of vertigo    Hyperlipidemia    Hypertension    Hypothyroidism    PONV (postoperative nausea and vomiting)    Thyroid disease    hypothyroidism post Hashimoto's & partial thyroidectomy    Past Surgical History:  Procedure Laterality Date   CATARACT EXTRACTION W/ INTRAOCULAR LENS  IMPLANT, BILATERAL     COLONOSCOPY     diverticulosis   JOINT REPLACEMENT  1995 & 1999   THR bilaterally    lumbar disc repair  1989   Dr Arrie Aran   SPINE SURGERY     cervical ; Dr Vertell Limber   THYROIDECTOMY, PARTIAL     benign nodule   TONSILLECTOMY AND ADENOIDECTOMY     TOTAL SHOULDER ARTHROPLASTY Left 02/27/2016   Procedure: LEFT REVERSE TOTAL SHOULDER ARTHROPLASTY;  Surgeon: Renette Butters, MD;  Location: [facility];  Service: Orthopedics;  Laterality: Left;   vitreous detachment     Dr Claudean Kinds     Allergies  Allergen Reactions   Gabapentin     hallucinations   Other     Strong pain medications cause n/v   Morphine     Nausea & vomiting    Family History  Problem Relation Age of Onset   Diabetes Mother  Hepatitis Mother        Hepatitis B,? etiology   Miscarriages / Stillbirths Mother    Stroke Mother 58   Heart disease Father        CHF   Cancer Paternal Aunt        GI cancers   Heart disease Paternal Aunt        CHF   Cancer Paternal Uncle        lung, GI   Heart disease Paternal Uncle        CHF     Prior to Admission medications   Medication Sig Start Date End Date Taking? Authorizing Provider  acetaminophen (TYLENOL) 500 MG tablet Take 500 mg by mouth every 6 (six) hours as needed.    [provider]  chlorhexidine (PERIDEX) 0.12 % solution Use as directed 15 mLs in the  mouth or throat 2 (two) times daily. Patient not taking: Reported on 12/20/2021 04/13/20   [provider]  Cholecalciferol (VITAMIN D3) 1000 UNITS CAPS Take 1,000 Units by mouth daily.    [provider]  Cranberry 200 MG CAPS Take 1 capsule by mouth daily.    [provider]  diclofenac Sodium (VOLTAREN) 1 % GEL Apply 2 g topically 4 (four) times daily as needed (Pain). Patient not taking: Reported on 12/20/2021 12/04/21   Gerlene Fee, NP  dimenhyDRINATE (DRAMAMINE) 50 MG tablet Take 50 mg by mouth every 6 (six) hours as needed.    [provider]  FLUoxetine (PROZAC) 20 MG capsule Take 1 capsule (20 mg total) by mouth daily. 12/04/21   Gerlene Fee, NP  fluticasone (FLONASE) 50 MCG/ACT nasal spray Place 1 spray into both nostrils daily.    [provider]  levothyroxine (SYNTHROID) 125 MCG tablet Take 1 tablet (125 mcg total) by mouth daily before breakfast. 12/04/21   Gerlene Fee, NP  loratadine (CLARITIN) 10 MG tablet Take 10 mg by mouth daily.    [provider]  meloxicam (MOBIC) 7.5 MG tablet Take 1 tablet (7.5 mg total) by mouth 2 (two) times daily. 12/04/21   Gerlene Fee, NP  Multiple Vitamin (MULTIVITAMIN PO) Take 400 mg by mouth daily. Therems Multivitamin    [provider]  NON FORMULARY Diet: Dysphagia 3 diet with thin liquids, NAS    [provider]  Nutritional Supplements (ENSURE ENLIVE PO) Take by mouth. BID due to poor meal intake 1-50% and for repletion of protein stores. Special Instructions: Prefers vanilla or strawberry flavor.    [provider]  ondansetron (ZOFRAN) 8 MG tablet Take 1 tablet (8 mg total) by mouth 3 (three) times daily before meals. 12/04/21   Gerlene Fee, NP  pantoprazole (PROTONIX) 40 MG tablet Take 1 tablet (40 mg total) by mouth daily. 12/04/21   Gerlene Fee, NP  polyethylene glycol powder (GLYCOLAX/MIRALAX) 17 GM/SCOOP powder Take 17 g by mouth 2  (two) times daily as needed. Patient not taking: Reported on 12/20/2021 12/07/21   [name], MD  Polyvinyl Alcohol-Povidone (ARTIFICIAL TEARS) 5-6 MG/ML SOLN Place 2 drops into both eyes daily.    [provider]  scopolamine (TRANSDERM-SCOP) 1 MG/3DAYS Place 1 patch (1.5 mg total) onto the skin every 3 (three) days. 12/07/21   [name], MD  vitamin B-12 (CYANOCOBALAMIN) 250 MCG tablet Take 250 mcg by mouth daily.    [provider]    Physical Exam: Vitals:   01/07/22 1730 01/07/22 1745 01/07/22 1800 01/07/22  1815  BP: (!) 149/77 (!) 148/73 (!) 150/78 (!) 144/96  Pulse: 66 66 70 65  Resp: (!) 21 20 19 15   Temp:      TempSrc:      SpO2: 94% 93% 96% 96%  Weight:      Height:        Constitutional: NAD, calm, comfortable, thin frail elderly female lying flat in bed Vitals:   01/07/22 1730 01/07/22 1745 01/07/22 1800 01/07/22 1815  BP: (!) 149/77 (!) 148/73 (!) 150/78 (!) 144/96  Pulse: 66 66 70 65  Resp: (!) 21 20 19 15   Temp:      TempSrc:      SpO2: 94% 93% 96% 96%  Weight:      Height:       Eyes: lids and conjunctivae normal ENMT: Mucous membranes are moist.  Neck: normal, supple Respiratory: clear to auscultation bilaterally, no wheezing, no crackles. Normal respiratory effort. No accessory muscle use.  Cardiovascular: Regular rate and rhythm, no murmurs / rubs / gallops.  Moderate nonpitting edema bilateral lower ankle. Abdomen: no tenderness, Bowel sounds positive.  Musculoskeletal: no clubbing / cyanosis. No joint deformity upper and lower extremities.  Normal muscle tone.  Skin: no rashes, lesions, ulcers. No induration Neurologic: CN 2-12 grossly intact. Strength 4/5 in lower extremity. Psychiatric: Normal judgment and insight. Alert and oriented x 3. Normal mood.    Labs on Admission: I have personally reviewed following labs and imaging studies  CBC: Recent Labs  Lab 01/07/22 1534  WBC 6.4  NEUTROABS 4.6  HGB 13.7   HCT 40.7  MCV 92.3  PLT 272   Basic Metabolic Panel: Recent Labs  Lab 01/07/22 1534  NA 132*  K 4.4  CL 95*  CO2 30  GLUCOSE 123*  BUN 10  CREATININE 0.55  CALCIUM 8.7*   GFR: Estimated Creatinine Clearance: 47.9 mL/min (by C-G formula based on SCr of 0.55 mg/dL). Liver Function Tests: Recent Labs  Lab 01/07/22 1534  AST 23  ALT 18  ALKPHOS 60  BILITOT 0.4  PROT 6.7  ALBUMIN 3.4*   No results for input(s): LIPASE, AMYLASE in the last 168 hours. No results for input(s): AMMONIA in the last 168 hours. Coagulation Profile: Recent Labs  Lab 01/07/22 1534  INR 1.0   Cardiac Enzymes: No results for input(s): CKTOTAL, CKMB, CKMBINDEX, TROPONINI in the last 168 hours. BNP (last 3 results) No results for input(s): PROBNP in the last 8760 hours. HbA1C: No results for input(s): HGBA1C in the last 72 hours. CBG: No results for input(s): GLUCAP in the last 168 hours. Lipid Profile: No results for input(s): CHOL, HDL, LDLCALC, TRIG, CHOLHDL, LDLDIRECT in the last 72 hours. Thyroid Function Tests: No results for input(s): TSH, T4TOTAL, FREET4, T3FREE, THYROIDAB in the last 72 hours. Anemia Panel: No results for input(s): VITAMINB12, FOLATE, FERRITIN, TIBC, IRON, RETICCTPCT in the last 72 hours. Urine analysis:    Component Value Date/Time   COLORURINE STRAW (A) 01/07/2022 1724   APPEARANCEUR HAZY (A) 01/07/2022 1724   LABSPEC 1.018 01/07/2022 1724   PHURINE 8.0 01/07/2022 1724   GLUCOSEU NEGATIVE 01/07/2022 1724   HGBUR NEGATIVE 01/07/2022 1724   BILIRUBINUR NEGATIVE 01/07/2022 1724   BILIRUBINUR negative 12/20/2019 1413   KETONESUR NEGATIVE 01/07/2022 1724   PROTEINUR NEGATIVE 01/07/2022 1724   UROBILINOGEN 0.2 12/20/2019 1413   UROBILINOGEN 0.2 07/16/2010 0038   NITRITE NEGATIVE 01/07/2022 1724   LEUKOCYTESUR SMALL (A) 01/07/2022 1724    Radiological Exams on Admission: DG Chest  1 View  Result Date: 01/07/2022 CLINICAL DATA:  Weakness EXAM: CHEST  1 VIEW  COMPARISON:  11/19/2021 chest radiograph. FINDINGS: Partially visualized left total shoulder arthroplasty. Stable cardiomediastinal silhouette with mild cardiomegaly. No pneumothorax. No pleural effusion. Chronic moderate elevation of the right hemidiaphragm. No pulmonary edema. No acute consolidative airspace disease. Thyroidectomy clips in the medial lower right neck. IMPRESSION: Mild cardiomegaly without pulmonary edema. No active pulmonary disease. Electronically Signed   By: Ilona Sorrel M.D.   On: 01/07/2022 15:56   CT Head Wo Contrast  Result Date: 01/07/2022 CLINICAL DATA:  A female at age 26 presents with dizziness in decreased appetite. EXAM: CT HEAD WITHOUT CONTRAST TECHNIQUE: Contiguous axial images were obtained from the base of the skull through the vertex without intravenous contrast. RADIATION DOSE REDUCTION: This exam was performed according to the departmental dose-optimization program which includes automated exposure control, adjustment of the mA and/or kV according to patient size and/or use of iterative reconstruction technique. COMPARISON:  October 23, 2020. FINDINGS: Brain: No evidence of acute infarction, hemorrhage, hydrocephalus, extra-axial collection or mass lesion/mass effect. Mild generalized atrophy. Vascular: No hyperdense vessel or unexpected calcification. Skull: Normal. Negative for fracture or focal lesion. Sinuses/Orbits: Visualized paranasal sinuses and orbits without acute process Other: None IMPRESSION: 1. No acute intracranial abnormality. 2. Mild generalized atrophy. Electronically Signed   By: Zetta Bills M.D.   On: 01/07/2022 16:51   CT Chest W Contrast  Result Date: 01/07/2022 CLINICAL DATA:  Nausea and vomiting.  Respiratory illness. EXAM: CT CHEST, ABDOMEN, AND PELVIS WITH CONTRAST TECHNIQUE: Multidetector CT imaging of the chest, abdomen and pelvis was performed following the standard protocol during bolus administration of intravenous contrast. RADIATION  DOSE REDUCTION: This exam was performed according to the departmental dose-optimization program which includes automated exposure control, adjustment of the mA and/or kV according to patient size and/or use of iterative reconstruction technique. CONTRAST:  183mL OMNIPAQUE IOHEXOL 300 MG/ML  SOLN COMPARISON:  Chest x-ray earlier the same day FINDINGS: CT CHEST FINDINGS Cardiovascular: Heart is enlarged. No pericardial effusion identified. Main pulmonary artery is normal caliber. Thoracic aorta is tortuous and normal caliber with moderate atherosclerotic plaques. Mediastinum/Nodes: No bulky axillary, mediastinal or hilar lymphadenopathy identified. Multiple calcified prevascular mediastinal lymph nodes noted. Lungs/Pleura: Diffuse interlobular septal thickening with mild mosaic attenuation of the lungs. Pulmonary vascular prominence. Biapical pleural thickening. Trace bilateral pleural effusions with associated mild subsegmental atelectasis at the lung bases. No focal consolidation identified. Diffuse mild bronchial wall thickening. No pneumothorax. Musculoskeletal: Degenerative changes of the spine. No suspicious bony lesions identified. CT ABDOMEN PELVIS FINDINGS Hepatobiliary: Liver is normal in size and contour with no suspicious mass identified. Gallbladder appears normal. No biliary ductal dilatation identified. Pancreas: Unremarkable. No pancreatic ductal dilatation or surrounding inflammatory changes. Spleen: Normal in size without focal abnormality. Adrenals/Urinary Tract: Mild nodularity of the left adrenal gland measuring up to 8 mm in thickness. Symmetric perfusion of the kidneys. No hydronephrosis or enhancing renal mass identified bilaterally. No urinary bladder wall mass identified, significantly limited evaluation due to metallic artifacts from the bilateral hips. Stomach/Bowel: No bowel obstruction, free air or pneumatosis. No bowel wall edema or thickening identified. Colonic diverticulosis. No  evidence of acute appendicitis. Vascular/Lymphatic: Aortic atherosclerosis. No enlarged abdominal or pelvic lymph nodes. Reproductive: Retroverted uterus. No suspicious adnexal mass appreciated. Other: No ascites. Musculoskeletal: Advanced degenerative changes of the lumbar spine. Bilateral hip arthroplasties. No suspicious bony lesions identified. IMPRESSION: 1. Evidence of CHF. Cardiomegaly, pulmonary vascular prominence, trace pleural effusions, interstitial  edema and possibly early alveolar edema. 2. No acute process identified in the abdomen or pelvis. 3. Colonic diverticulosis. 4. Other chronic findings as described. Electronically Signed   By: Ofilia Neas M.D.   On: 01/07/2022 16:58   CT Abdomen Pelvis W Contrast  Result Date: 01/07/2022 CLINICAL DATA:  Nausea and vomiting.  Respiratory illness. EXAM: CT CHEST, ABDOMEN, AND PELVIS WITH CONTRAST TECHNIQUE: Multidetector CT imaging of the chest, abdomen and pelvis was performed following the standard protocol during bolus administration of intravenous contrast. RADIATION DOSE REDUCTION: This exam was performed according to the departmental dose-optimization program which includes automated exposure control, adjustment of the mA and/or kV according to patient size and/or use of iterative reconstruction technique. CONTRAST:  159mL OMNIPAQUE IOHEXOL 300 MG/ML  SOLN COMPARISON:  Chest x-ray earlier the same day FINDINGS: CT CHEST FINDINGS Cardiovascular: Heart is enlarged. No pericardial effusion identified. Main pulmonary artery is normal caliber. Thoracic aorta is tortuous and normal caliber with moderate atherosclerotic plaques. Mediastinum/Nodes: No bulky axillary, mediastinal or hilar lymphadenopathy identified. Multiple calcified prevascular mediastinal lymph nodes noted. Lungs/Pleura: Diffuse interlobular septal thickening with mild mosaic attenuation of the lungs. Pulmonary vascular prominence. Biapical pleural thickening. Trace bilateral pleural  effusions with associated mild subsegmental atelectasis at the lung bases. No focal consolidation identified. Diffuse mild bronchial wall thickening. No pneumothorax. Musculoskeletal: Degenerative changes of the spine. No suspicious bony lesions identified. CT ABDOMEN PELVIS FINDINGS Hepatobiliary: Liver is normal in size and contour with no suspicious mass identified. Gallbladder appears normal. No biliary ductal dilatation identified. Pancreas: Unremarkable. No pancreatic ductal dilatation or surrounding inflammatory changes. Spleen: Normal in size without focal abnormality. Adrenals/Urinary Tract: Mild nodularity of the left adrenal gland measuring up to 8 mm in thickness. Symmetric perfusion of the kidneys. No hydronephrosis or enhancing renal mass identified bilaterally. No urinary bladder wall mass identified, significantly limited evaluation due to metallic artifacts from the bilateral hips. Stomach/Bowel: No bowel obstruction, free air or pneumatosis. No bowel wall edema or thickening identified. Colonic diverticulosis. No evidence of acute appendicitis. Vascular/Lymphatic: Aortic atherosclerosis. No enlarged abdominal or pelvic lymph nodes. Reproductive: Retroverted uterus. No suspicious adnexal mass appreciated. Other: No ascites. Musculoskeletal: Advanced degenerative changes of the lumbar spine. Bilateral hip arthroplasties. No suspicious bony lesions identified. IMPRESSION: 1. Evidence of CHF. Cardiomegaly, pulmonary vascular prominence, trace pleural effusions, interstitial edema and possibly early alveolar edema. 2. No acute process identified in the abdomen or pelvis. 3. Colonic diverticulosis. 4. Other chronic findings as described. Electronically Signed   By: Ofilia Neas M.D.   On: 01/07/2022 16:58      Assessment/Plan  Suspected new onset CHF  -Has mild worsening lower extremity edema but CT chest showing cardiomegaly, trace bilateral pleural effusion and vascular congestion -Obtain  echocardiogram -Give one-time IV Lasix 40 mg and follow response with intake and output before giving it daily since I am concern that she could also become volume quickly with her decreased p.o. intake.  Dysphagia  MBS planned outpatient this week. Proceed with MDS with speech eval here   Vertigo  Continue scopolamine patch, dramamine PRN   Failure to thrive  Multifactorial from vertigo, dysphagia with decrease PO intake and possible new CHF  -check TSH   Hypothyroidism - Continue levothyroxine.  Check TSH.  Stage 2 sacral pressure ulcer  Wound care daily    DVT prophylaxis:.Lovenox Code Status: Full Family Communication: Plan discussed with patient at bedside  disposition Plan: Home with observation Consults called:  Admission status: Observation   Level of  care: Telemetry  Status is: Observation The patient remains OBS appropriate and will d/c before 2 midnights.  Planned Discharge Destination: Home with [facility]   If 7PM-7AM, please contact night-coverage [email or web address]   01/07/2022, 7:17 PM

## 2022-01-07 NOTE — ED Provider Notes (Signed)
[facility] DEPT Provider Note   CSN: [id number] Arrival date & time: 01/07/22  1506     History  Chief Complaint  Patient presents with   Emesis   Failure To Thrive    Tasha Moss is a 86 y.o. female.  86 year old female with prior medical history as detailed below presents for evaluation.  She is accompanied by her daughters.  Patient with significant decline in function over the last 2 to 3 months.  Over the last 1 to 2 weeks family of patient has noted significant decline in function.  Patient with significant difficulty ambulating.  Patient with minimal to no p.o. intake over the last 2 to 3 days.  Patient has had some aspiration noted on barium swallow testing done on the 11th.  Patient with mildly increased cough since this procedure.  No reported fevers.  No reported chest pain.  The history is provided by the patient, medical records and a relative.  Illness Location:  Decreased p.o. intake, cough, weakness Severity:  Moderate Onset quality:  Gradual Duration:  2 weeks Timing:  Constant Progression:  Worsening Chronicity:  New     Home Medications Prior to Admission medications   Medication Sig Start Date End Date Taking? Authorizing Provider  acetaminophen (TYLENOL) 500 MG tablet Take 500 mg by mouth every 6 (six) hours as needed.    [provider]  chlorhexidine (PERIDEX) 0.12 % solution Use as directed 15 mLs in the mouth or throat 2 (two) times daily. Patient not taking: Reported on 12/20/2021 04/13/20   [provider]  Cholecalciferol (VITAMIN D3) 1000 UNITS CAPS Take 1,000 Units by mouth daily.    [provider]  Cranberry 200 MG CAPS Take 1 capsule by mouth daily.    [provider]  diclofenac Sodium (VOLTAREN) 1 % GEL Apply 2 g topically 4 (four) times daily as needed (Pain). Patient not taking: Reported on 12/20/2021 12/04/21   Gerlene Fee, NP  dimenhyDRINATE (DRAMAMINE) 50 MG  tablet Take 50 mg by mouth every 6 (six) hours as needed.    [provider]  FLUoxetine (PROZAC) 20 MG capsule Take 1 capsule (20 mg total) by mouth daily. 12/04/21   Gerlene Fee, NP  fluticasone (FLONASE) 50 MCG/ACT nasal spray Place 1 spray into both nostrils daily.    [provider]  levothyroxine (SYNTHROID) 125 MCG tablet Take 1 tablet (125 mcg total) by mouth daily before breakfast. 12/04/21   Gerlene Fee, NP  loratadine (CLARITIN) 10 MG tablet Take 10 mg by mouth daily.    [provider]  meloxicam (MOBIC) 7.5 MG tablet Take 1 tablet (7.5 mg total) by mouth 2 (two) times daily. 12/04/21   Gerlene Fee, NP  Multiple Vitamin (MULTIVITAMIN PO) Take 400 mg by mouth daily. Therems Multivitamin    [provider]  NON FORMULARY Diet: Dysphagia 3 diet with thin liquids, NAS    [provider]  Nutritional Supplements (ENSURE ENLIVE PO) Take by mouth. BID due to poor meal intake 1-50% and for repletion of protein stores. Special Instructions: Prefers vanilla or strawberry flavor.    [provider]  ondansetron (ZOFRAN) 8 MG tablet Take 1 tablet (8 mg total) by mouth 3 (three) times daily before meals. 12/04/21   Gerlene Fee, NP  pantoprazole (PROTONIX) 40 MG tablet Take 1 tablet (40 mg total) by mouth daily. 12/04/21   Gerlene Fee, NP  polyethylene glycol powder (GLYCOLAX/MIRALAX) 17 GM/SCOOP powder  Take 17 g by mouth 2 (two) times daily as needed. Patient not taking: Reported on 12/20/2021 12/07/21   [name], MD  Polyvinyl Alcohol-Povidone (ARTIFICIAL TEARS) 5-6 MG/ML SOLN Place 2 drops into both eyes daily.    [provider]  scopolamine (TRANSDERM-SCOP) 1 MG/3DAYS Place 1 patch (1.5 mg total) onto the skin every 3 (three) days. 12/07/21   [name], MD  vitamin B-12 (CYANOCOBALAMIN) 250 MCG tablet Take 250 mcg by mouth daily.    [provider]      Allergies    Gabapentin,  Other, and Morphine    Review of Systems   Review of Systems  All other systems reviewed and are negative.  Physical Exam Updated Vital Signs BP (!) 144/96    Pulse 65    Temp 97.6 F (36.4 C) (Oral)    Resp 15    Ht 5\' 5"  (1.651 m)    Wt 60 kg    SpO2 96%    BMI 22.01 kg/m  Physical Exam Vitals and nursing note reviewed.  Constitutional:      General: She is not in acute distress.    Appearance: Normal appearance. She is well-developed.  HENT:     Head: Normocephalic and atraumatic.     Mouth/Throat:     Mouth: Mucous membranes are dry.  Eyes:     Conjunctiva/sclera: Conjunctivae normal.     Pupils: Pupils are equal, round, and reactive to light.  Cardiovascular:     Rate and Rhythm: Normal rate and regular rhythm.     Heart sounds: Normal heart sounds.  Pulmonary:     Effort: Pulmonary effort is normal. No respiratory distress.     Breath sounds: Normal breath sounds.  Abdominal:     General: There is no distension.     Palpations: Abdomen is soft.     Tenderness: There is no abdominal tenderness.  Musculoskeletal:        General: No deformity. Normal range of motion.     Cervical back: Normal range of motion and neck supple.  Skin:    General: Skin is warm and dry.  Neurological:     General: No focal deficit present.     Mental Status: She is alert and oriented to person, place, and time.    ED Results / Procedures / Treatments   Labs (all labs ordered are listed, but only abnormal results are displayed) Labs Reviewed  COMPREHENSIVE METABOLIC PANEL - Abnormal; Notable for the following components:      Result Value   Sodium 132 (*)    Chloride 95 (*)    Glucose, Bld 123 (*)    Calcium 8.7 (*)    Albumin 3.4 (*)    All other components within normal limits  URINALYSIS, ROUTINE W REFLEX MICROSCOPIC - Abnormal; Notable for the following components:   Color, Urine STRAW (*)    APPearance HAZY (*)    Leukocytes,Ua SMALL (*)    Bacteria, UA RARE (*)    All  other components within normal limits  BRAIN NATRIURETIC PEPTIDE - Abnormal; Notable for the following components:   B Natriuretic Peptide 113.5 (*)    All other components within normal limits  RESP PANEL BY RT-PCR (FLU A&B, COVID) ARPGX2  CBC WITH DIFFERENTIAL/PLATELET  PROTIME-INR  LACTIC ACID, PLASMA  LACTIC ACID, PLASMA  TROPONIN I (HIGH SENSITIVITY)  TROPONIN I (HIGH SENSITIVITY)    EKG EKG Interpretation  Date/Time:  Monday January 07 2022 15:35:17 EST Ventricular  Rate:  66 PR Interval:  191 QRS Duration: 91 QT Interval:  436 QTC Calculation: 457 R Axis:   -18 Text Interpretation: Sinus rhythm Borderline left axis deviation Probable anterior infarct, age indeterminate Confirmed by Dene Gentry [phone]) on 01/07/2022 3:47:22 PM  Radiology DG Chest 1 View  Result Date: 01/07/2022 CLINICAL DATA:  Weakness EXAM: CHEST  1 VIEW COMPARISON:  11/19/2021 chest radiograph. FINDINGS: Partially visualized left total shoulder arthroplasty. Stable cardiomediastinal silhouette with mild cardiomegaly. No pneumothorax. No pleural effusion. Chronic moderate elevation of the right hemidiaphragm. No pulmonary edema. No acute consolidative airspace disease. Thyroidectomy clips in the medial lower right neck. IMPRESSION: Mild cardiomegaly without pulmonary edema. No active pulmonary disease. Electronically Signed   By: Ilona Sorrel M.D.   On: 01/07/2022 15:56   CT Head Wo Contrast  Result Date: 01/07/2022 CLINICAL DATA:  A female at age 40 presents with dizziness in decreased appetite. EXAM: CT HEAD WITHOUT CONTRAST TECHNIQUE: Contiguous axial images were obtained from the base of the skull through the vertex without intravenous contrast. RADIATION DOSE REDUCTION: This exam was performed according to the departmental dose-optimization program which includes automated exposure control, adjustment of the mA and/or kV according to patient size and/or use of iterative reconstruction technique.  COMPARISON:  October 23, 2020. FINDINGS: Brain: No evidence of acute infarction, hemorrhage, hydrocephalus, extra-axial collection or mass lesion/mass effect. Mild generalized atrophy. Vascular: No hyperdense vessel or unexpected calcification. Skull: Normal. Negative for fracture or focal lesion. Sinuses/Orbits: Visualized paranasal sinuses and orbits without acute process Other: None IMPRESSION: 1. No acute intracranial abnormality. 2. Mild generalized atrophy. Electronically Signed   By: Zetta Bills M.D.   On: 01/07/2022 16:51   CT Chest W Contrast  Result Date: 01/07/2022 CLINICAL DATA:  Nausea and vomiting.  Respiratory illness. EXAM: CT CHEST, ABDOMEN, AND PELVIS WITH CONTRAST TECHNIQUE: Multidetector CT imaging of the chest, abdomen and pelvis was performed following the standard protocol during bolus administration of intravenous contrast. RADIATION DOSE REDUCTION: This exam was performed according to the departmental dose-optimization program which includes automated exposure control, adjustment of the mA and/or kV according to patient size and/or use of iterative reconstruction technique. CONTRAST:  153mL OMNIPAQUE IOHEXOL 300 MG/ML  SOLN COMPARISON:  Chest x-ray earlier the same day FINDINGS: CT CHEST FINDINGS Cardiovascular: Heart is enlarged. No pericardial effusion identified. Main pulmonary artery is normal caliber. Thoracic aorta is tortuous and normal caliber with moderate atherosclerotic plaques. Mediastinum/Nodes: No bulky axillary, mediastinal or hilar lymphadenopathy identified. Multiple calcified prevascular mediastinal lymph nodes noted. Lungs/Pleura: Diffuse interlobular septal thickening with mild mosaic attenuation of the lungs. Pulmonary vascular prominence. Biapical pleural thickening. Trace bilateral pleural effusions with associated mild subsegmental atelectasis at the lung bases. No focal consolidation identified. Diffuse mild bronchial wall thickening. No pneumothorax.  Musculoskeletal: Degenerative changes of the spine. No suspicious bony lesions identified. CT ABDOMEN PELVIS FINDINGS Hepatobiliary: Liver is normal in size and contour with no suspicious mass identified. Gallbladder appears normal. No biliary ductal dilatation identified. Pancreas: Unremarkable. No pancreatic ductal dilatation or surrounding inflammatory changes. Spleen: Normal in size without focal abnormality. Adrenals/Urinary Tract: Mild nodularity of the left adrenal gland measuring up to 8 mm in thickness. Symmetric perfusion of the kidneys. No hydronephrosis or enhancing renal mass identified bilaterally. No urinary bladder wall mass identified, significantly limited evaluation due to metallic artifacts from the bilateral hips. Stomach/Bowel: No bowel obstruction, free air or pneumatosis. No bowel wall edema or thickening identified. Colonic diverticulosis. No evidence of acute appendicitis. Vascular/Lymphatic: Aortic atherosclerosis.  No enlarged abdominal or pelvic lymph nodes. Reproductive: Retroverted uterus. No suspicious adnexal mass appreciated. Other: No ascites. Musculoskeletal: Advanced degenerative changes of the lumbar spine. Bilateral hip arthroplasties. No suspicious bony lesions identified. IMPRESSION: 1. Evidence of CHF. Cardiomegaly, pulmonary vascular prominence, trace pleural effusions, interstitial edema and possibly early alveolar edema. 2. No acute process identified in the abdomen or pelvis. 3. Colonic diverticulosis. 4. Other chronic findings as described. Electronically Signed   By: Ofilia Neas M.D.   On: 01/07/2022 16:58   CT Abdomen Pelvis W Contrast  Result Date: 01/07/2022 CLINICAL DATA:  Nausea and vomiting.  Respiratory illness. EXAM: CT CHEST, ABDOMEN, AND PELVIS WITH CONTRAST TECHNIQUE: Multidetector CT imaging of the chest, abdomen and pelvis was performed following the standard protocol during bolus administration of intravenous contrast. RADIATION DOSE REDUCTION:  This exam was performed according to the departmental dose-optimization program which includes automated exposure control, adjustment of the mA and/or kV according to patient size and/or use of iterative reconstruction technique. CONTRAST:  154mL OMNIPAQUE IOHEXOL 300 MG/ML  SOLN COMPARISON:  Chest x-ray earlier the same day FINDINGS: CT CHEST FINDINGS Cardiovascular: Heart is enlarged. No pericardial effusion identified. Main pulmonary artery is normal caliber. Thoracic aorta is tortuous and normal caliber with moderate atherosclerotic plaques. Mediastinum/Nodes: No bulky axillary, mediastinal or hilar lymphadenopathy identified. Multiple calcified prevascular mediastinal lymph nodes noted. Lungs/Pleura: Diffuse interlobular septal thickening with mild mosaic attenuation of the lungs. Pulmonary vascular prominence. Biapical pleural thickening. Trace bilateral pleural effusions with associated mild subsegmental atelectasis at the lung bases. No focal consolidation identified. Diffuse mild bronchial wall thickening. No pneumothorax. Musculoskeletal: Degenerative changes of the spine. No suspicious bony lesions identified. CT ABDOMEN PELVIS FINDINGS Hepatobiliary: Liver is normal in size and contour with no suspicious mass identified. Gallbladder appears normal. No biliary ductal dilatation identified. Pancreas: Unremarkable. No pancreatic ductal dilatation or surrounding inflammatory changes. Spleen: Normal in size without focal abnormality. Adrenals/Urinary Tract: Mild nodularity of the left adrenal gland measuring up to 8 mm in thickness. Symmetric perfusion of the kidneys. No hydronephrosis or enhancing renal mass identified bilaterally. No urinary bladder wall mass identified, significantly limited evaluation due to metallic artifacts from the bilateral hips. Stomach/Bowel: No bowel obstruction, free air or pneumatosis. No bowel wall edema or thickening identified. Colonic diverticulosis. No evidence of acute  appendicitis. Vascular/Lymphatic: Aortic atherosclerosis. No enlarged abdominal or pelvic lymph nodes. Reproductive: Retroverted uterus. No suspicious adnexal mass appreciated. Other: No ascites. Musculoskeletal: Advanced degenerative changes of the lumbar spine. Bilateral hip arthroplasties. No suspicious bony lesions identified. IMPRESSION: 1. Evidence of CHF. Cardiomegaly, pulmonary vascular prominence, trace pleural effusions, interstitial edema and possibly early alveolar edema. 2. No acute process identified in the abdomen or pelvis. 3. Colonic diverticulosis. 4. Other chronic findings as described. Electronically Signed   By: Ofilia Neas M.D.   On: 01/07/2022 16:58    Procedures Procedures    Medications Ordered in ED Medications  sodium chloride 0.9 % bolus 500 mL (0 mLs Intravenous Stopped 01/07/22 1805)  iohexol (OMNIPAQUE) 300 MG/ML solution 100 mL (100 mLs Intravenous Contrast Given 01/07/22 1626)    ED Course/ Medical Decision Making/ A&P                           Medical Decision Making Amount and/or Complexity of Data Reviewed Labs: ordered. Radiology: ordered.  Risk Prescription drug management. Decision regarding hospitalization.    Medical Screen Complete  This patient presented to the ED with complaint of  generalized weakness, decreased p.o. intake, cough.  This complaint involves an extensive number of treatment options. The initial differential diagnosis includes, but is not limited to, viral versus bacterial infection, metabolic abnormality, dehydration, AKI, cardiac dysfunction, malignancy, etc.  This presentation is: Acute, Chronic, Previously Undiagnosed, Uncertain Prognosis, Complicated, Systemic Symptoms, and Threat to Life/Bodily Function  Patient is presenting with a host of complaints -most significant is significant decreased functionality and associated weakness.  Patient's work-up is concerning for mildly decreased sodium, evidence of possible  CHF on CT of the chest.  Patient would likely benefit from admission for further work-up and treatment.    Hospitalist service is aware of case and will evaluate for same.  Co morbidities that complicated the patient's evaluation  Advanced age   Additional history obtained:  Additional history obtained from [facility] External records from outside sources obtained and reviewed including prior ED visits and prior Inpatient records.    Lab Tests:  I ordered and personally interpreted labs.  The pertinent results include:  cbc cmp trop bnp covid/flu   Imaging Studies ordered:  I ordered imaging studies including head CT, chest CT, CT abdomen and pelvis, chest x-ray I independently visualized and interpreted obtained imaging which showed findings consistent with possible CHF I agree with the radiologist interpretation.   Cardiac Monitoring:  The patient was maintained on a cardiac monitor.  I personally viewed and interpreted the cardiac monitor which showed an underlying rhythm of: NSR     Problem List / ED Course:  Weakness, failure to thrive, possible CHF   Reevaluation:  After the interventions noted above, I reevaluated the patient and found that they have: stayed the same    Disposition:  After consideration of the diagnostic results and the patients response to treatment, I feel that the patent would benefit from admission for further work-up and treatment.          Final Clinical Impression(s) / ED Diagnoses Final diagnoses:  Weakness    Rx / DC Orders ED Discharge Orders     None         Valarie Merino, MD 01/07/22 [phone]

## 2022-01-07 NOTE — ED Notes (Signed)
Report given to nurse.

## 2022-01-08 ENCOUNTER — Telehealth (HOSPITAL_COMMUNITY): Payer: Self-pay | Admitting: Speech Pathology

## 2022-01-08 ENCOUNTER — Observation Stay (HOSPITAL_COMMUNITY): Payer: Medicare Other

## 2022-01-08 ENCOUNTER — Telehealth: Payer: Self-pay

## 2022-01-08 DIAGNOSIS — R918 Other nonspecific abnormal finding of lung field: Secondary | ICD-10-CM | POA: Diagnosis not present

## 2022-01-08 DIAGNOSIS — L89152 Pressure ulcer of sacral region, stage 2: Secondary | ICD-10-CM | POA: Diagnosis present

## 2022-01-08 DIAGNOSIS — K2100 Gastro-esophageal reflux disease with esophagitis, without bleeding: Secondary | ICD-10-CM | POA: Diagnosis present

## 2022-01-08 DIAGNOSIS — Z800 Family history of malignant neoplasm of digestive organs: Secondary | ICD-10-CM | POA: Diagnosis not present

## 2022-01-08 DIAGNOSIS — E039 Hypothyroidism, unspecified: Secondary | ICD-10-CM | POA: Diagnosis not present

## 2022-01-08 DIAGNOSIS — R6339 Other feeding difficulties: Secondary | ICD-10-CM | POA: Diagnosis not present

## 2022-01-08 DIAGNOSIS — Z8616 Personal history of COVID-19: Secondary | ICD-10-CM | POA: Diagnosis not present

## 2022-01-08 DIAGNOSIS — Z515 Encounter for palliative care: Secondary | ICD-10-CM | POA: Diagnosis not present

## 2022-01-08 DIAGNOSIS — Z96612 Presence of left artificial shoulder joint: Secondary | ICD-10-CM | POA: Diagnosis present

## 2022-01-08 DIAGNOSIS — K449 Diaphragmatic hernia without obstruction or gangrene: Secondary | ICD-10-CM | POA: Diagnosis present

## 2022-01-08 DIAGNOSIS — I5041 Acute combined systolic (congestive) and diastolic (congestive) heart failure: Secondary | ICD-10-CM | POA: Diagnosis not present

## 2022-01-08 DIAGNOSIS — R1310 Dysphagia, unspecified: Secondary | ICD-10-CM | POA: Diagnosis not present

## 2022-01-08 DIAGNOSIS — H8113 Benign paroxysmal vertigo, bilateral: Secondary | ICD-10-CM | POA: Diagnosis present

## 2022-01-08 DIAGNOSIS — I10 Essential (primary) hypertension: Secondary | ICD-10-CM | POA: Diagnosis not present

## 2022-01-08 DIAGNOSIS — Z7189 Other specified counseling: Secondary | ICD-10-CM | POA: Diagnosis not present

## 2022-01-08 DIAGNOSIS — I509 Heart failure, unspecified: Secondary | ICD-10-CM

## 2022-01-08 DIAGNOSIS — R1312 Dysphagia, oropharyngeal phase: Secondary | ICD-10-CM | POA: Diagnosis not present

## 2022-01-08 DIAGNOSIS — E785 Hyperlipidemia, unspecified: Secondary | ICD-10-CM | POA: Diagnosis present

## 2022-01-08 DIAGNOSIS — Z833 Family history of diabetes mellitus: Secondary | ICD-10-CM | POA: Diagnosis not present

## 2022-01-08 DIAGNOSIS — R627 Adult failure to thrive: Secondary | ICD-10-CM | POA: Diagnosis present

## 2022-01-08 DIAGNOSIS — Z96643 Presence of artificial hip joint, bilateral: Secondary | ICD-10-CM | POA: Diagnosis present

## 2022-01-08 DIAGNOSIS — R933 Abnormal findings on diagnostic imaging of other parts of digestive tract: Secondary | ICD-10-CM | POA: Diagnosis not present

## 2022-01-08 DIAGNOSIS — Z79899 Other long term (current) drug therapy: Secondary | ICD-10-CM | POA: Diagnosis not present

## 2022-01-08 DIAGNOSIS — R634 Abnormal weight loss: Secondary | ICD-10-CM | POA: Diagnosis not present

## 2022-01-08 DIAGNOSIS — Z79890 Hormone replacement therapy (postmenopausal): Secondary | ICD-10-CM | POA: Diagnosis not present

## 2022-01-08 DIAGNOSIS — B370 Candidal stomatitis: Secondary | ICD-10-CM | POA: Diagnosis present

## 2022-01-08 DIAGNOSIS — Z6820 Body mass index (BMI) 20.0-20.9, adult: Secondary | ICD-10-CM | POA: Diagnosis not present

## 2022-01-08 DIAGNOSIS — I5031 Acute diastolic (congestive) heart failure: Secondary | ICD-10-CM | POA: Diagnosis present

## 2022-01-08 DIAGNOSIS — E890 Postprocedural hypothyroidism: Secondary | ICD-10-CM | POA: Diagnosis present

## 2022-01-08 DIAGNOSIS — E871 Hypo-osmolality and hyponatremia: Secondary | ICD-10-CM | POA: Diagnosis present

## 2022-01-08 DIAGNOSIS — H8110 Benign paroxysmal vertigo, unspecified ear: Secondary | ICD-10-CM | POA: Diagnosis not present

## 2022-01-08 DIAGNOSIS — I110 Hypertensive heart disease with heart failure: Secondary | ICD-10-CM | POA: Diagnosis present

## 2022-01-08 DIAGNOSIS — L03317 Cellulitis of buttock: Secondary | ICD-10-CM | POA: Diagnosis present

## 2022-01-08 DIAGNOSIS — R601 Generalized edema: Secondary | ICD-10-CM | POA: Diagnosis not present

## 2022-01-08 DIAGNOSIS — R531 Weakness: Secondary | ICD-10-CM | POA: Diagnosis present

## 2022-01-08 DIAGNOSIS — R1314 Dysphagia, pharyngoesophageal phase: Secondary | ICD-10-CM | POA: Diagnosis present

## 2022-01-08 DIAGNOSIS — Z8249 Family history of ischemic heart disease and other diseases of the circulatory system: Secondary | ICD-10-CM | POA: Diagnosis not present

## 2022-01-08 DIAGNOSIS — K222 Esophageal obstruction: Secondary | ICD-10-CM | POA: Diagnosis present

## 2022-01-08 DIAGNOSIS — E43 Unspecified severe protein-calorie malnutrition: Secondary | ICD-10-CM | POA: Diagnosis not present

## 2022-01-08 DIAGNOSIS — E440 Moderate protein-calorie malnutrition: Secondary | ICD-10-CM | POA: Diagnosis present

## 2022-01-08 LAB — COMPREHENSIVE METABOLIC PANEL
ALT: 16 U/L (ref 0–44)
AST: 20 U/L (ref 15–41)
Albumin: 3 g/dL — ABNORMAL LOW (ref 3.5–5.0)
Alkaline Phosphatase: 60 U/L (ref 38–126)
Anion gap: 8 (ref 5–15)
BUN: 9 mg/dL (ref 8–23)
CO2: 31 mmol/L (ref 22–32)
Calcium: 8.5 mg/dL — ABNORMAL LOW (ref 8.9–10.3)
Chloride: 94 mmol/L — ABNORMAL LOW (ref 98–111)
Creatinine, Ser: 0.48 mg/dL (ref 0.44–1.00)
GFR, Estimated: >60 mL/min (ref >60)
Glucose, Bld: 133 mg/dL — ABNORMAL HIGH (ref 70–99)
Potassium: 3.6 mmol/L (ref 3.5–5.1)
Sodium: 133 mmol/L — ABNORMAL LOW (ref 135–145)
Total Bilirubin: 1 mg/dL (ref 0.3–1.2)
Total Protein: 6 g/dL — ABNORMAL LOW (ref 6.5–8.1)

## 2022-01-08 LAB — CBC
HCT: 38.6 % (ref 36.0–46.0)
Hemoglobin: 13.2 g/dL (ref 12.0–15.0)
MCH: 31.5 pg (ref 26.0–34.0)
MCHC: 34.2 g/dL (ref 30.0–36.0)
MCV: 92.1 fL (ref 80.0–100.0)
Platelets: 252 10*3/uL (ref 150–400)
RBC: 4.19 MIL/uL (ref 3.87–5.11)
RDW: 15 % (ref 11.5–15.5)
WBC: 9.6 10*3/uL (ref 4.0–10.5)
nRBC: 0 % (ref 0.0–0.2)

## 2022-01-08 LAB — BASIC METABOLIC PANEL
Anion gap: 7 (ref 5–15)
BUN: 8 mg/dL (ref 8–23)
CO2: 30 mmol/L (ref 22–32)
Calcium: 8.3 mg/dL — ABNORMAL LOW (ref 8.9–10.3)
Chloride: 96 mmol/L — ABNORMAL LOW (ref 98–111)
Creatinine, Ser: 0.42 mg/dL — ABNORMAL LOW (ref 0.44–1.00)
GFR, Estimated: >60 mL/min (ref >60)
Glucose, Bld: 115 mg/dL — ABNORMAL HIGH (ref 70–99)
Potassium: 3.5 mmol/L (ref 3.5–5.1)
Sodium: 133 mmol/L — ABNORMAL LOW (ref 135–145)

## 2022-01-08 LAB — BRAIN NATRIURETIC PEPTIDE: B Natriuretic Peptide: 97.4 pg/mL (ref 0.0–100.0)

## 2022-01-08 LAB — T4, FREE: Free T4: 1.18 ng/dL — ABNORMAL HIGH (ref 0.61–1.12)

## 2022-01-08 LAB — VITAMIN B12: Vitamin B-12: 797 pg/mL (ref 180–914)

## 2022-01-08 LAB — TSH: TSH: 6.567 u[IU]/mL — ABNORMAL HIGH (ref 0.350–4.500)

## 2022-01-08 MED ORDER — ONDANSETRON HCL 4 MG/2ML IJ SOLN
4.0000 mg | Freq: Once | INTRAMUSCULAR | Status: DC
Start: 2022-01-08 — End: 2022-01-17
  Filled 2022-01-08: qty 2

## 2022-01-08 NOTE — Evaluation (Addendum)
SLP Note  Patient Details Name: Tasha Moss MRN: [id number] DOB: 27-Aug-1936   Cancelled treatment:       Reason Eval/Treat Not Completed: Other (comment) (Order for MBS received and clarified with MD, will conduct MBS at approx 1430 today as schedule permits.  Thank you.)  Kathleen Lime, MS [facility] SLP [place] Office [phone] Cell [phone]  Macario Golds 01/08/2022, 12:57 PM

## 2022-01-08 NOTE — Assessment & Plan Note (Addendum)
-   Appreciate wound care recommendations -Area examined, recommending and some area with sloughing skin, continue Keflex for cellulitis

## 2022-01-08 NOTE — Assessment & Plan Note (Addendum)
-  CT with CHF, cardiomegaly, pulm vascular prominence, trace effusions, interstitial edema, possibly early alveolar edema.  Received Lasix x1. -Appears to be compensated and euvolemic -2D echo 2/1 showed EF of 65%, G1 DD

## 2022-01-08 NOTE — Telephone Encounter (Signed)
Caller name:Saria Curenton  (husband)  On DPR? :Yes  Call back number:[phone]  Provider they see: Birdie Riddle   Reason for call:TSH is elevated and she is in the hospital and wants to know  if a medication will be sent in for here when she is released from the hospital.

## 2022-01-08 NOTE — Telephone Encounter (Signed)
Spoke to husband and let him know what Dr Birdie Riddle advised. He voiced understanding

## 2022-01-08 NOTE — Progress Notes (Signed)
Modified Barium Swallow Progress Note  Patient Details  Name: Tasha Moss MRN: [id number] Date of Birth: [birth year]  Today's Date: 01/08/2022  Modified Barium Swallow completed.  Full report located under Chart Review in the Imaging Section.  Brief recommendations include the following:  Clinical Impression  Patient presents with severe pharyngocervical dysphagia with both sensorimotor and obstructive dysphagia resulting in gross retention of barium throughout pharynx and aspiration of thin liquids.  Pt with inadequate epiglottic deflection due to it contacting posterior pharyngeal wall resulting in retention.  Her prior ACDF prevents her from conducing postures including chin tuck, head turn,etc.  With thin, pt aspirates due to decreased laryngeal closure and pharyngeal retention spilling into larynx during and after swallow.  Pt does have cough response but this did not clear aspirates.  Pt tested with HOB reclined to 30* to determine if would maintain boluses in posterior pharynx decreasing aspiration - however pt was noted to begin gagging after cued to "hock" and reports gagging occurs frequently during meals and thus repositioned her to upright position.  Decreasing amount to tsps prevented aspiration of thin.  Nectar cup boluses were not aspirated but were retained in pharynx.  Pt swallowing of cracker resulted in severe pharyngeal retention - causing her to reflexively expectorate x2.  Suspect pt has component of chronic dysphagia due to position of cervical spine with current exacerbation due to current illness/deconditioning.  Concern present for pt's nutrition and aspiration risk given level of dysphagia.  Holly, daughter, present and educated to findings/recommendations.   Swallow Evaluation Recommendations       SLP Diet Recommendations: Nectar thick liquid;Thin liquid   Liquid Administration via: Cup;Spoon nectar via straw, cup and thin via tsp only   Medication Administration:  Other (Comment) (consider suspension)   Supervision: Full supervision/cueing for compensatory strategies   Compensations: Follow solids with liquid;Small sips/bites;Slow rate; multiple swallows with each bite/sip, occasional throat clear and re-swallow   Postural Changes: Remain semi-upright after after feeds/meals (Comment);Seated upright at 90 degrees   Oral Care Recommendations: Oral care QID   Other Recommendations: Order thickener from pharmacy;Have oral suction available;Clarify dietary restrictions   Kathleen Lime, MS [facility] SLP Acute Rehab Services Office [phone] Cell [phone]  Macario Golds 01/08/2022,4:55 PM

## 2022-01-08 NOTE — Assessment & Plan Note (Addendum)
-   Followed outpatient by GI, underwent upper GI series as outpatient on 1/19 with aspiration of contrast into trachea at the level of mainstem bronchi.  -SLP consulted.  MBS showed pharyngeal esophageal and pharyngeal phase dysphagia-chronic due to position of cervical spine with exacerbation related to her current illness/deconditioning -Recommended full liquid, nectar thick diet. -EGD on 2/3, esophageal dilation.  Recommended PEG. -Palliative medicine consulted and patient, family wishes to proceed with PEG -Underwent PEG placement on 2/9, was not able to tolerate bolus feeds. -Placed back on continuous feeds on 2/12 and has been tolerating it.  No further nausea vomiting or diarrhea. -Plan is to have continuous feed at night at home with full liquid, nectar thick diet during the day if patient is able to tolerate. -Patient is currently tolerating continuous tube feeding, has diarrhea today

## 2022-01-08 NOTE — Progress Notes (Signed)
Speech Language Pathology Treatment: Dysphagia  Patient Details Name: Tasha Moss MRN: [id number] DOB: Nov 18, 1936 Today's Date: 01/08/2022 Time: 4235-3614 SLP Time Calculation (min) (ACUTE ONLY): 23 min  Assessment / Plan / Recommendation Clinical Impression  Follow up session to review with pt and her family the MBS study *spouse and 2 of the patient's daughters present* - reviewed flouroscopy loops in detail - compared to a regular MBS study.   Per review of pt medical hx, length of time of symptoms, pt's dysphagia is chronic from obstruction from cervical spine with decreased compensation ability due to deconditioning over time.  Anticipate pt's swallow will not improve to allow adequate nutrition and ongoing aspiration risk present.  Reviewed that PEG tube will not prevent aspiration - and is only meant for nutrition.    Reviewed swallow precautions using teach back with pt and family.  Will modify diet to full liquid, nectar and allow tsp of thin with strict precautions.   Questions answered to pt and family's satisfaction - Will follow up for education, dysphagia management, ? RMST to help with laryngeal closure.     HPI HPI: per admitting MD note "Patient was hospitalized back in November with a dislocated left total hip arthroplasty status post reduction and was discharged to SNF.  Also contracted COVID in rehab.  She subsequently developed worsening vertigo with nausea and vomiting thought to be due to bilateral vestibular disorder.  She also has progressive worsening dysphagia and issues with passage of solid food. She was referred to GI and had barium swallow on 1/19  but aspirated contrast. Now has modified barium swallow planned at [facility] later this week."  Esophagram was unremarkable except for aspiraiton.  MBS ordered in house for pt due to pt's ongoing dysphagia issues. Per interview with pt and pt's daughter, Tasha Moss, pt has been having dysphagia for approx six months.  Dysphagia  coupled with gustatory deficits have caused pt to lose a significant amount of weight - most notably since November.  Pt states food stick in her throat causing her to drink liquids to clear or cough and expectorate.  She has undergone prior esophageal dilatation and daughter reports pt has problems with pills. Pt also with h/o cervical spine surgery and thyroid surgery.      SLP Plan  Continue with current plan of care      Recommendations for follow up therapy are one component of a multi-disciplinary discharge planning process, led by the attending physician.  Recommendations may be updated based on patient status, additional functional criteria and insurance authorization.    Recommendations  Diet recommendations: Thin liquid;Nectar-thick liquid Liquids provided via: Cup;Straw;Teaspoon Medication Administration: Other (Comment) (liquid or with nectar) Supervision: Full supervision/cueing for compensatory strategies Compensations: Follow solids with liquid;Small sips/bites;Slow rate Postural Changes and/or Swallow Maneuvers: Seated upright 90 degrees;Upright 30-60 min after meal                Oral Care Recommendations: Oral care BID Follow Up Recommendations: Long-term institutional care without follow-up therapy Assistance recommended at discharge: None SLP Visit Diagnosis: Dysphagia, pharyngoesophageal phase (R13.14);Dysphagia, pharyngeal phase (R13.13) Plan: Continue with current plan of care           Macario Golds. Kathleen Lime, [place] Office [phone] Cell [phone]    01/08/2022, 5:55 PM

## 2022-01-08 NOTE — TOC Initial Note (Signed)
Transition of Care [facility]) - Initial/Assessment Note    Patient Details  Name: Tasha Moss MRN: [id number] Date of Birth: 1936/09/16  Transition of Care [facility]) CM/SW Contact:    Leeroy Cha, RN Phone Number: 01/08/2022, 8:07 AM  Clinical Narrative:                 History from husband patient is very [place].  Transition of Care [facility]) Screening Note   Patient Details  Name: Tasha Moss Date of Birth: Jul 28, 1936   Transition of Care [facility]) CM/SW Contact:    Leeroy Cha, RN Phone Number: 01/08/2022, 8:07 AM    Transition of Care Department [facility]) has reviewed patient and no TOC needs have been identified at this time. We will continue to monitor patient advancement through interdisciplinary progression rounds. If new patient transition needs arise, please place a TOC consult.    Expected Discharge Plan: [place] Barriers to Discharge: Continued Medical Work up   Patient Goals and CMS Choice Patient states their goals for this hospitalization and ongoing recovery are:: to go home CMS Medicare.gov Compare Post Acute Care list provided to:: Patient Represenative (must comment) (husband patient is very hoh) Choice offered to / list presented to : Spouse  Expected Discharge Plan and Services Expected Discharge Plan: [place]   Discharge Planning Services: CM Consult   Living arrangements for the past 2 months: Single Family Home                                      Prior Living Arrangements/Services Living arrangements for the past 2 months: Single Family Home Lives with:: Spouse Patient language and need for interpreter reviewed:: Yes Do you feel safe going back to the place where you live?: Yes      Need for Family Participation in Patient Care: Yes (Comment) Care giver support system in place?: Yes (comment)   Criminal Activity/Legal Involvement Pertinent to Current Situation/Hospitalization: No - Comment as  needed  Activities of Daily Living Home Assistive Devices/Equipment: Eyeglasses, Hearing aid, Walker (specify type), Cane (specify quad or straight) (bilateral hearing aides, lift chair) ADL Screening (condition at time of admission) Patient's cognitive ability adequate to safely complete daily activities?: Yes Is the patient deaf or have difficulty hearing?: Yes (wears bilateral hearing aides) Does the patient have difficulty seeing, even when wearing glasses/contacts?: No Does the patient have difficulty concentrating, remembering, or making decisions?: Yes (" a little bit") Patient able to express need for assistance with ADLs?: Yes Does the patient have difficulty dressing or bathing?: No Independently performs ADLs?: No Communication: Independent Dressing (OT): Independent Grooming: Independent Feeding: Independent Bathing: Independent Toileting: Needs assistance Is this a change from baseline?: Pre-admission baseline In/Out Bed: Needs assistance Is this a change from baseline?: Pre-admission baseline Walks in Home: Needs assistance Is this a change from baseline?: Pre-admission baseline Does the patient have difficulty walking or climbing stairs?: Yes Weakness of Legs: Both Weakness of Arms/Hands: Both  Permission Sought/Granted                  Emotional Assessment Appearance:: Appears stated age Attitude/Demeanor/Rapport: Engaged Affect (typically observed): Calm Orientation: : Oriented to Self, Oriented to Place, Oriented to  Time, Oriented to Situation Alcohol / Substance Use: Not Applicable Psych Involvement: No (comment)  Admission diagnosis:  Weakness [R53.1] Failure to thrive in adult [R62.7] Patient Active  Problem List   Diagnosis Date Noted   Acute CHF (congestive heart failure) ([place]) 01/08/2022   Sacral decubitus ulcer, stage II ([place]) 01/08/2022   Failure to thrive in adult 01/07/2022   Non-seasonal allergic rhinitis 11/29/2021   SARS-associated  coronavirus exposure 11/24/2021   Unspecified protein-calorie malnutrition ([place]) 11/24/2021   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 ([place]) 11/06/2021   Elevated BP without diagnosis of hypertension 10/31/2020   Dizziness 10/31/2020   Status post reverse arthroplasty of left shoulder 03/11/2016   Depression with anxiety 03/11/2016   Routine general medical examination at a health care facility 06/29/2014   H/O thyroid nodule 06/21/2013   DIVERTICULOSIS, COLON 07/18/2010   GERD without esophagitis 04/09/2010   Stricture and stenosis of esophagus 02/21/2010   Dysphagia 02/09/2010   Osteoarthritis 11/22/2009   Benign paroxysmal positional vertigo 05/15/2009   HTN (hypertension) 09/28/2008   SPINAL STENOSIS, LUMBAR 09/28/2008   PERNICIOUS ANEMIA 03/28/2008   Hypothyroidism 09/08/2007   HYPERLIPIDEMIA 09/08/2007   RESTLESS LEG SYNDROME 09/08/2007   HASHIMOTO'S THYROIDITIS 02/23/2007   PCP:  [name], MD Pharmacy:   [place], [place] - [address] [place] [address] Phone: [phone] Fax: [phone]     Social Determinants of Health (SDOH) Interventions    Readmission Risk Interventions No flowsheet data found.

## 2022-01-08 NOTE — Progress Notes (Signed)
PROGRESS NOTE    Tasha Moss  JJK:[id number] DOB: 1936/10/16 DOA: 01/07/2022 PCP: [name], MD  Chief Complaint  Patient presents with   Emesis   Failure To Thrive    Brief Narrative:  86 yo F with hx BPPV, hypertension, HLD, hypothyroidism s/p partial thyroidectomy, GERD, esophageal stricture who presented with generalized weakness.  Sounds like was admitted with closed left hip dislocation s/p reduction in November and was discharged to SNF.  She caught COVID in rehab and has subsequently developed progressive vertigo with nausea and vomiting.  She's also had progressive dysphagia and issues with passage of solid food.  She had barium swallow on 1/19 where she aspirated into the trachea and plan was for MBS at [facility] this week.  She continued to have dizziness with vertigo and N/V.  She's developed progressive weakness.  She also had worsening edema and imaging concerning for HF.  She's been admitted with failure to thrive.    See below for additional details    Assessment & Plan:   Principal Problem:   Failure to thrive in adult Active Problems:   Dysphagia   Benign paroxysmal positional vertigo   Acute CHF (congestive heart failure) (HCC)   Hypothyroidism   Sacral decubitus ulcer, stage II (HCC)   Assessment and Plan: * Failure to thrive in adult- (present on admission) Multifactorial, related to vertigo, her dysphagia, and concern for HF Follow closely, she's considering peg tube due to dysphagia  Dysphagia She was followed by [facility], will request them to follow up here (seen 1/10 and that time plan was for upper GI series -> 1/19 with aspiration of contrast into trachea at level of mainstem bronchi -> recommended referral to SLP  MBS today with pharyngoesophageal and pharyngeal phase dysphagia - chronic dysphagia thought 2/2 position of cervical spine with exacerbation related to her current illness/deconditioning SLP recommending full  liquid, nectar thick liquid Will consult GI to follow    Benign paroxysmal positional vertigo- (present on admission) Continue scopolamine patch, prn dramamine Head CT 01/07/2022 without acute abnormality She's previously had MRI for vertigo in 2021 PT/OT vestibular rehab Consider additional evaluation if persistent and not improving  Acute CHF (congestive heart failure) ([place]) CT with CHF, cardiomegaly, pulm vascular prominence, trace effusions, interstitial edema, possibly early alveolar edema S/p lasix x1 Currently does not appear hypervolemic Hold further lasix Follow CXR 2/1 Echo pending Strict I/O, daily weights  Hypothyroidism- (present on admission) TSH mildly elevated Follow repeat after acute illness  Sacral decubitus ulcer, stage II ([place]) Wound c/s   Daughter is Shawndell Varas nurse here, please call her to update  DVT prophylaxis: lovenox Code Status: full Family Communication: daughter, [name] Disposition:   Status is: Observation The patient will require care spanning > 2 midnights and should be moved to inpatient because: need for GI c/s, therapy, continued treatment of vertigo  Planned Discharge Destination:  pending        Consultants:  GI  Procedures:  MBS  Antimicrobials:  Anti-infectives (From admission, onward)    None       Subjective: No new complaints  Objective: Vitals:   01/07/22 2130 01/08/22 0109 01/08/22 0600 01/08/22 1029  BP:  (!) 113/57 (!) 115/58 130/68  Pulse:  76 78 80  Resp:  16 18 16   Temp:  98.1 F (36.7 C) 98 F (36.7 C) 98.1 F (36.7 C)  TempSrc:  Oral Oral Oral  SpO2:  92% 94% (!) 89%  Weight:  59.9 kg   Height: 5\' 5"  (1.651 m)       Intake/Output Summary (Last 24 hours) at 01/08/2022 1910 Last data filed at 01/08/2022 0300 Gross per 24 hour  Intake --  Output 1200 ml  Net -1200 ml   Filed Weights   01/07/22 1515 01/08/22 0600  Weight: 60 kg 59.9 kg    Examination:  General exam: Appears calm and  comfortable  Respiratory system: Clear to auscultation. Respiratory effort normal. Cardiovascular system: S1 & S2 heard, RRR. Gastrointestinal system: Abdomen is nondistended, soft and nontender.  Central nervous system: Alert and oriented. No focal neurological deficits - CN 2-12 intact, symmetric strength. Extremities: no LEE Skin: No rashes, lesions or ulcers Psychiatry: Judgement and insight appear normal. Mood & affect appropriate.     Data Reviewed: I have personally reviewed following labs and imaging studies  CBC: Recent Labs  Lab 01/07/22 1534 01/08/22 0805  WBC 6.4 9.6  NEUTROABS 4.6  --   HGB 13.7 13.2  HCT 40.7 38.6  MCV 92.3 92.1  PLT 278 332    Basic Metabolic Panel: Recent Labs  Lab 01/07/22 1534 01/08/22 0335 01/08/22 0805  NA 132* 133* 133*  K 4.4 3.5 3.6  CL 95* 96* 94*  CO2 30 30 31   GLUCOSE 123* 115* 133*  BUN 10 8 9   CREATININE 0.55 0.42* 0.48  CALCIUM 8.7* 8.3* 8.5*    GFR: Estimated Creatinine Clearance: 47.9 mL/min (by C-G formula based on SCr of 0.48 mg/dL).  Liver Function Tests: Recent Labs  Lab 01/07/22 1534 01/08/22 0805  AST 23 20  ALT 18 16  ALKPHOS 60 60  BILITOT 0.4 1.0  PROT 6.7 6.0*  ALBUMIN 3.4* 3.0*    CBG: No results for input(s): GLUCAP in the last 168 hours.   Recent Results (from the past 240 hour(s))  Resp Panel by RT-PCR (Flu Sammie Denner&B, Covid) Nasopharyngeal Swab     Status: None   Collection Time: 01/07/22  3:34 PM   Specimen: Nasopharyngeal Swab; Nasopharyngeal(NP) swabs in vial transport medium  Result Value Ref Range Status   SARS Coronavirus 2 by RT PCR NEGATIVE NEGATIVE Final    Comment: (NOTE) SARS-CoV-2 target nucleic acids are NOT DETECTED.  The SARS-CoV-2 RNA is generally detectable in upper respiratory specimens during the acute phase of infection. The lowest concentration of SARS-CoV-2 viral copies this assay can detect is 138 copies/mL. Lorain Keast negative result does not preclude SARS-Cov-2 infection  and should not be used as the sole basis for treatment or other patient management decisions. Laquitha Heslin negative result may occur with  improper specimen collection/handling, submission of specimen other than nasopharyngeal swab, presence of viral mutation(s) within the areas targeted by this assay, and inadequate number of viral copies(<138 copies/mL). Tanisha Lutes negative result must be combined with clinical observations, patient history, and epidemiological information. The expected result is Negative.  Fact Sheet for Patients:  EntrepreneurPulse.com.au  Fact Sheet for Healthcare Providers:  IncredibleEmployment.be  This test is no t yet approved or cleared by the [place] FDA and  has been authorized for detection and/or diagnosis of SARS-CoV-2 by FDA under an Emergency Use Authorization (EUA). This EUA will remain  in effect (meaning this test can be used) for the duration of the COVID-19 declaration under Section 564(b)(1) of the Act, 21 U.S.C.section 360bbb-3(b)(1), unless the authorization is terminated  or revoked sooner.       Influenza Anaira Seay by PCR NEGATIVE NEGATIVE Final   Influenza B by PCR NEGATIVE NEGATIVE Final  Comment: (NOTE) The Xpert Xpress SARS-CoV-2/FLU/RSV plus assay is intended as an aid in the diagnosis of influenza from Nasopharyngeal swab specimens and should not be used as Karan Inclan sole basis for treatment. Nasal washings and aspirates are unacceptable for Xpert Xpress SARS-CoV-2/FLU/RSV testing.  Fact Sheet for Patients: EntrepreneurPulse.com.au  Fact Sheet for Healthcare Providers: IncredibleEmployment.be  This test is not yet approved or cleared by the [place] FDA and has been authorized for detection and/or diagnosis of SARS-CoV-2 by FDA under an Emergency Use Authorization (EUA). This EUA will remain in effect (meaning this test can be used) for the duration of the COVID-19 declaration  under Section 564(b)(1) of the Act, 21 U.S.C. section 360bbb-3(b)(1), unless the authorization is terminated or revoked.  Performed at [facility], [place] [address] [place], [place] [address]          Radiology Studies: DG Chest 1 View  Result Date: 01/07/2022 CLINICAL DATA:  Weakness EXAM: CHEST  1 VIEW COMPARISON:  11/19/2021 chest radiograph. FINDINGS: Partially visualized left total shoulder arthroplasty. Stable cardiomediastinal silhouette with mild cardiomegaly. No pneumothorax. No pleural effusion. Chronic moderate elevation of the right hemidiaphragm. No pulmonary edema. No acute consolidative airspace disease. Thyroidectomy clips in the medial lower right neck. IMPRESSION: Mild cardiomegaly without pulmonary edema. No active pulmonary disease. Electronically Signed   By: Ilona Sorrel M.D.   On: 01/07/2022 15:56   CT Head Wo Contrast  Result Date: 01/07/2022 CLINICAL DATA:  Antwan Pandya female at age 56 presents with dizziness in decreased appetite. EXAM: CT HEAD WITHOUT CONTRAST TECHNIQUE: Contiguous axial images were obtained from the base of the skull through the vertex without intravenous contrast. RADIATION DOSE REDUCTION: This exam was performed according to the departmental dose-optimization program which includes automated exposure control, adjustment of the mA and/or kV according to patient size and/or use of iterative reconstruction technique. COMPARISON:  October 23, 2020. FINDINGS: Brain: No evidence of acute infarction, hemorrhage, hydrocephalus, extra-axial collection or mass lesion/mass effect. Mild generalized atrophy. Vascular: No hyperdense vessel or unexpected calcification. Skull: Normal. Negative for fracture or focal lesion. Sinuses/Orbits: Visualized paranasal sinuses and orbits without acute process Other: None IMPRESSION: 1. No acute intracranial abnormality. 2. Mild generalized atrophy. Electronically Signed   By: Zetta Bills M.D.   On: 01/07/2022  16:51   CT Chest W Contrast  Result Date: 01/07/2022 CLINICAL DATA:  Nausea and vomiting.  Respiratory illness. EXAM: CT CHEST, ABDOMEN, AND PELVIS WITH CONTRAST TECHNIQUE: Multidetector CT imaging of the chest, abdomen and pelvis was performed following the standard protocol during bolus administration of intravenous contrast. RADIATION DOSE REDUCTION: This exam was performed according to the departmental dose-optimization program which includes automated exposure control, adjustment of the mA and/or kV according to patient size and/or use of iterative reconstruction technique. CONTRAST:  173mL OMNIPAQUE IOHEXOL 300 MG/ML  SOLN COMPARISON:  Chest x-ray earlier the same day FINDINGS: CT CHEST FINDINGS Cardiovascular: Heart is enlarged. No pericardial effusion identified. Main pulmonary artery is normal caliber. Thoracic aorta is tortuous and normal caliber with moderate atherosclerotic plaques. Mediastinum/Nodes: No bulky axillary, mediastinal or hilar lymphadenopathy identified. Multiple calcified prevascular mediastinal lymph nodes noted. Lungs/Pleura: Diffuse interlobular septal thickening with mild mosaic attenuation of the lungs. Pulmonary vascular prominence. Biapical pleural thickening. Trace bilateral pleural effusions with associated mild subsegmental atelectasis at the lung bases. No focal consolidation identified. Diffuse mild bronchial wall thickening. No pneumothorax. Musculoskeletal: Degenerative changes of the spine. No suspicious bony lesions identified. CT ABDOMEN PELVIS FINDINGS Hepatobiliary: Liver is normal in  size and contour with no suspicious mass identified. Gallbladder appears normal. No biliary ductal dilatation identified. Pancreas: Unremarkable. No pancreatic ductal dilatation or surrounding inflammatory changes. Spleen: Normal in size without focal abnormality. Adrenals/Urinary Tract: Mild nodularity of the left adrenal gland measuring up to 8 mm in thickness. Symmetric perfusion of  the kidneys. No hydronephrosis or enhancing renal mass identified bilaterally. No urinary bladder wall mass identified, significantly limited evaluation due to metallic artifacts from the bilateral hips. Stomach/Bowel: No bowel obstruction, free air or pneumatosis. No bowel wall edema or thickening identified. Colonic diverticulosis. No evidence of acute appendicitis. Vascular/Lymphatic: Aortic atherosclerosis. No enlarged abdominal or pelvic lymph nodes. Reproductive: Retroverted uterus. No suspicious adnexal mass appreciated. Other: No ascites. Musculoskeletal: Advanced degenerative changes of the lumbar spine. Bilateral hip arthroplasties. No suspicious bony lesions identified. IMPRESSION: 1. Evidence of CHF. Cardiomegaly, pulmonary vascular prominence, trace pleural effusions, interstitial edema and possibly early alveolar edema. 2. No acute process identified in the abdomen or pelvis. 3. Colonic diverticulosis. 4. Other chronic findings as described. Electronically Signed   By: Ofilia Neas M.D.   On: 01/07/2022 16:58   CT Abdomen Pelvis W Contrast  Result Date: 01/07/2022 CLINICAL DATA:  Nausea and vomiting.  Respiratory illness. EXAM: CT CHEST, ABDOMEN, AND PELVIS WITH CONTRAST TECHNIQUE: Multidetector CT imaging of the chest, abdomen and pelvis was performed following the standard protocol during bolus administration of intravenous contrast. RADIATION DOSE REDUCTION: This exam was performed according to the departmental dose-optimization program which includes automated exposure control, adjustment of the mA and/or kV according to patient size and/or use of iterative reconstruction technique. CONTRAST:  126mL OMNIPAQUE IOHEXOL 300 MG/ML  SOLN COMPARISON:  Chest x-ray earlier the same day FINDINGS: CT CHEST FINDINGS Cardiovascular: Heart is enlarged. No pericardial effusion identified. Main pulmonary artery is normal caliber. Thoracic aorta is tortuous and normal caliber with moderate atherosclerotic  plaques. Mediastinum/Nodes: No bulky axillary, mediastinal or hilar lymphadenopathy identified. Multiple calcified prevascular mediastinal lymph nodes noted. Lungs/Pleura: Diffuse interlobular septal thickening with mild mosaic attenuation of the lungs. Pulmonary vascular prominence. Biapical pleural thickening. Trace bilateral pleural effusions with associated mild subsegmental atelectasis at the lung bases. No focal consolidation identified. Diffuse mild bronchial wall thickening. No pneumothorax. Musculoskeletal: Degenerative changes of the spine. No suspicious bony lesions identified. CT ABDOMEN PELVIS FINDINGS Hepatobiliary: Liver is normal in size and contour with no suspicious mass identified. Gallbladder appears normal. No biliary ductal dilatation identified. Pancreas: Unremarkable. No pancreatic ductal dilatation or surrounding inflammatory changes. Spleen: Normal in size without focal abnormality. Adrenals/Urinary Tract: Mild nodularity of the left adrenal gland measuring up to 8 mm in thickness. Symmetric perfusion of the kidneys. No hydronephrosis or enhancing renal mass identified bilaterally. No urinary bladder wall mass identified, significantly limited evaluation due to metallic artifacts from the bilateral hips. Stomach/Bowel: No bowel obstruction, free air or pneumatosis. No bowel wall edema or thickening identified. Colonic diverticulosis. No evidence of acute appendicitis. Vascular/Lymphatic: Aortic atherosclerosis. No enlarged abdominal or pelvic lymph nodes. Reproductive: Retroverted uterus. No suspicious adnexal mass appreciated. Other: No ascites. Musculoskeletal: Advanced degenerative changes of the lumbar spine. Bilateral hip arthroplasties. No suspicious bony lesions identified. IMPRESSION: 1. Evidence of CHF. Cardiomegaly, pulmonary vascular prominence, trace pleural effusions, interstitial edema and possibly early alveolar edema. 2. No acute process identified in the abdomen or pelvis.  3. Colonic diverticulosis. 4. Other chronic findings as described. Electronically Signed   By: Ofilia Neas M.D.   On: 01/07/2022 16:58   DG Swallowing Func-Speech Pathology  Result Date:  01/08/2022 Table formatting from the original result was not included. Objective Swallowing Evaluation: Type of Study: MBS-Modified Barium Swallow Study  Patient Details Name: MEGHA AGNES MRN: [id number] Date of Birth: 07/14/1936 Today's Date: 01/08/2022 Time: SLP Start Time (ACUTE ONLY): 1500 -SLP Stop Time (ACUTE ONLY): 1025 SLP Time Calculation (min) (ACUTE ONLY): 35 min Past Medical History: Past Medical History: Diagnosis Date  Allergy   seasonal & dust  Anxiety   Arthritis   DJD  Family history of adverse reaction to anesthesia   n/v   GERD (gastroesophageal reflux disease)   History of vertigo   Hyperlipidemia   Hypertension   Hypothyroidism   PONV (postoperative nausea and vomiting)   Thyroid disease   hypothyroidism post Hashimoto's & partial thyroidectomy Past Surgical History: Past Surgical History: Procedure Laterality Date  CATARACT EXTRACTION W/ INTRAOCULAR LENS  IMPLANT, BILATERAL    COLONOSCOPY    diverticulosis  JOINT REPLACEMENT  1995 & 1999  THR bilaterally   lumbar disc repair  1989  Dr Arrie Aran  SPINE SURGERY    cervical ; Dr Vertell Limber  THYROIDECTOMY, PARTIAL    benign nodule  TONSILLECTOMY AND ADENOIDECTOMY    TOTAL SHOULDER ARTHROPLASTY Left 02/27/2016  Procedure: LEFT REVERSE TOTAL SHOULDER ARTHROPLASTY;  Surgeon: Renette Butters, MD;  Location: [facility];  Service: Orthopedics;  Laterality: Left;  vitreous detachment    Dr Claudean Kinds HPI: per admitting MD note "Patient was hospitalized back in November with Matalie Romberger dislocated left total hip arthroplasty status post reduction and was discharged to SNF.  Also contracted COVID in rehab.  She subsequently developed worsening vertigo with nausea and vomiting thought to be due to bilateral vestibular disorder.  She also has progressive worsening dysphagia and  issues with passage of solid food. She was referred to GI and had barium swallow on 1/19  but aspirated contrast. Now has modified barium swallow planned at [facility] later this week."  Esophagram was unremarkable except for aspiraiton.  MBS ordered in house for pt due to pt's ongoing dysphagia issues. Per interview with pt and pt's daughter, [name], pt has been having dysphagia for approx six months.  Dysphagia coupled with gustatory deficits have caused pt to lose Amadeo Coke significant amount of weight - most notably since November.  Pt states food stick in her throat causing her to drink liquids to clear or cough and expectorate.  She has undergone prior esophageal dilatation and daughter reports pt has problems with pills. Pt also with h/o cervical spine surgery and thyroid surgery.  Subjective: pt awake in chair, daughter *([name], RN and pt HCPOA) present  Recommendations for follow up therapy are one component of Bianka Liberati multi-disciplinary discharge planning process, led by the attending physician.  Recommendations may be updated based on patient status, additional functional criteria and insurance authorization. Assessment / Plan / Recommendation Clinical Impressions 01/08/2022 Clinical Impression Patient presents with severe pharyngocervical dysphagia with both sensorimotor and obstructive dysphagia resulting in gross retention of barium throughout pharynx and aspiration of thin liquids.  Pt with inadequate epiglottic deflection due to it contacting posterior pharyngeal wall resulting in retention.  Her prior ACDF prevents her from conducing postures including chin tuck, head turn,etc.  With thin, pt aspirates due to decreased laryngeal closure and pharyngeal retention spilling into larynx during and after swallow.  Pt does have cough response but this did not clear aspirates.  Pt tested with HOB reclined to 30* to determine if would maintain boluses in posterior pharynx decreasing aspiration - however pt was  noted to  begin gagging after cued to "hock" and reports gagging occurs frequently during meals and thus repositioned her to upright position.  Decreasing amount to tsps prevented aspiration of thin.  Nectar cup boluses were not aspirated but were retained in pharynx.  Pt swallowing of cracker resulted in severe pharyngeal retention - causing her to reflexively expectorate x2.  Suspect pt has component of chronic dysphagia due to position of cervical spine with current exacerbation due to current illness/deconditioning.  Concern present for pt's nutrition and aspiration risk given level of dysphagia.  Holly, daughter, present and educated to findings/recommendations. SLP Visit Diagnosis Dysphagia, pharyngoesophageal phase (R13.14);Dysphagia, pharyngeal phase (R13.13) Attention and concentration deficit following -- Frontal lobe and executive function deficit following -- Impact on safety and function Risk for inadequate nutrition/hydration;Moderate aspiration risk   Treatment Recommendations 01/08/2022 Treatment Recommendations Therapy as outlined in treatment plan below   Prognosis 01/08/2022 Prognosis for Safe Diet Advancement Guarded Barriers to Reach Goals Severity of deficits Barriers/Prognosis Comment -- Diet Recommendations 01/08/2022 SLP Diet Recommendations Nectar thick liquid;Thin liquid Liquid Administration via nectar via cup or straw, thin via tsp Medication Administration suspension Compensations Follow solids with liquid;Small sips/bites;Slow rate; multiple swallows, throat clear and re-swallow Postural Changes Remain semi-upright after after feeds/meals (Comment);Seated upright at 90 degrees   Other Recommendations 01/08/2022 Recommended Consults -- Oral Care Recommendations Oral care QID Other Recommendations Order thickener from pharmacy;Have oral suction available;Clarify dietary restrictions Follow Up Recommendations Long-term institutional care without follow-up therapy Assistance recommended at discharge  None Functional Status Assessment Patient has had [place] recent decline in their functional status and/or demonstrates limited ability to make significant improvements in function in Jenisha Faison reasonable and predictable amount of time Frequency and Duration  01/08/2022 Speech Therapy Frequency (ACUTE ONLY) min 1 x/week Treatment Duration 1 week   Oral Phase 01/08/2022 Oral Phase Impaired Oral - Pudding Teaspoon -- Oral - Pudding Cup -- Oral - Honey Teaspoon -- Oral - Honey Cup -- Oral - Nectar Teaspoon WFL Oral - Nectar Cup WFL Oral - Nectar Straw WFL Oral - Thin Teaspoon WFL Oral - Thin Cup WFL Oral - Thin Straw WFL Oral - Puree WFL Oral - Mech Soft WFL Oral - Regular -- Oral - Multi-Consistency -- Oral - Pill -- Oral Phase - Comment --  Pharyngeal Phase 01/08/2022 Pharyngeal Phase Impaired Pharyngeal- Pudding Teaspoon -- Pharyngeal -- Pharyngeal- Pudding Cup -- Pharyngeal -- Pharyngeal- Honey Teaspoon -- Pharyngeal -- Pharyngeal- Honey Cup -- Pharyngeal -- Pharyngeal- Nectar Teaspoon Pharyngeal residue - valleculae;Pharyngeal residue - pyriform;Reduced epiglottic inversion Pharyngeal Material does not enter airway Pharyngeal- Nectar Cup Pharyngeal residue - valleculae;Pharyngeal residue - pyriform;Reduced epiglottic inversion;Penetration/Aspiration during swallow;Penetration/Apiration after swallow;Reduced anterior laryngeal mobility;Reduced laryngeal elevation Pharyngeal Material enters airway, remains ABOVE vocal cords and not ejected out Pharyngeal- Nectar Straw Pharyngeal residue - valleculae;Pharyngeal residue - pyriform;Reduced epiglottic inversion Pharyngeal Material does not enter airway Pharyngeal- Thin Teaspoon Pharyngeal residue - valleculae;Pharyngeal residue - pyriform;Penetration/Aspiration during swallow Pharyngeal Material does not enter airway Pharyngeal- Thin Cup Pharyngeal residue - valleculae;Pharyngeal residue - pyriform;Reduced epiglottic inversion;Penetration/Aspiration during swallow Pharyngeal Material  enters airway, passes BELOW cords and not ejected out despite cough attempt by patient Pharyngeal- Thin Straw Pharyngeal residue - valleculae;Pharyngeal residue - pyriform;Penetration/Aspiration during swallow;Reduced epiglottic inversion;Reduced airway/laryngeal closure;Reduced tongue base retraction Pharyngeal Material enters airway, passes BELOW cords and not ejected out despite cough attempt by patient Pharyngeal- Puree Pharyngeal residue - valleculae;Pharyngeal residue - pyriform;Reduced epiglottic inversion;Reduced pharyngeal peristalsis;Reduced tongue base retraction Pharyngeal Material does not enter airway Pharyngeal-  Mechanical Soft Pharyngeal residue - valleculae;Pharyngeal residue - pyriform;Reduced epiglottic inversion;Pharyngeal residue - posterior pharnyx;Reduced tongue base retraction Pharyngeal Material does not enter airway Pharyngeal- Regular NT Pharyngeal -- Pharyngeal- Multi-consistency NT Pharyngeal -- Pharyngeal- Pill NT Pharyngeal -- Pharyngeal Comment Pt tested with HOB reclined to 30* to determine if would maintain boluses in posterior pharynx decreasing aspiration - however pt was noted to begin gagging after cued to "hock" and reports gagging occurs frequently during meals and thus repositioned her to upright position.  Decreasing amount to tsps prevented aspiration of thin.  Nectar cup boluses were not aspirated but were retained in pharynx.  Pt swallowing of cracker resulted in severe pharyngeal retention - causing her to reflexively expectorate x2.  Cervical Esophageal Phase  01/08/2022 Cervical Esophageal Phase Impaired Pudding Teaspoon -- Pudding Cup -- Honey Teaspoon -- Honey Cup -- Nectar Teaspoon -- Nectar Cup -- Nectar Straw -- Thin Teaspoon -- Thin Cup -- Thin Straw -- Puree -- Mechanical Soft -- Regular -- Multi-consistency -- Pill -- Cervical Esophageal Comment -- Kathleen Lime, MS [facility] SLP Acute Rehab Services Office [phone] Cell [phone] Macario Golds 01/08/2022, 4:56  PM                          Scheduled Meds:  cholecalciferol  1,000 Units Oral Daily   enoxaparin (LOVENOX) injection  40 mg Subcutaneous Q24H   feeding supplement  237 mL Oral BID BM   FLUoxetine  20 mg Oral Daily   furosemide  40 mg Intravenous Once   levothyroxine  125 mcg Oral QAC breakfast   loratadine  10 mg Oral Daily   pantoprazole  40 mg Oral Daily   polyvinyl alcohol  2 drop Both Eyes Daily   scopolamine  1 patch Transdermal Q72H   vitamin B-12  250 mcg Oral Daily   Continuous Infusions:   LOS: 0 days    Time spent: over 30 min    Fayrene Helper, MD [facility]   To contact the attending provider between 7A-7P or the covering provider during after hours 7P-7A, please log into the web site [email or web address] and access using universal [place] password for that web site. If you do not have the password, please call the hospital operator.  01/08/2022, 7:10 PM

## 2022-01-08 NOTE — Progress Notes (Signed)
IVT consulted for a stat difficult stick.  Upon arrival, pt hygiene being taken care of; IVT will come back

## 2022-01-08 NOTE — Assessment & Plan Note (Addendum)
-   Continue scopolamine patch, avoid Dramamine due to anticholinergic side effects.   -CT head on 1/30 head showed no acute abnormality.   -Continue PT OT vestibular rehab

## 2022-01-08 NOTE — Assessment & Plan Note (Addendum)
-   Multifactorial, related to vertigo, dysphagia, poor nutritional status

## 2022-01-08 NOTE — Assessment & Plan Note (Addendum)
-   TSH mildly elevated -Follow-up outpatient after acute illness is resolved

## 2022-01-08 NOTE — Hospital Course (Addendum)
86 yo F with hx BPPV, hypertension, HLD, hypothyroidism s/p partial thyroidectomy, GERD, esophageal stricture who presented with generalized weakness.  Sounds like was admitted with closed left hip dislocation s/p reduction in November and was discharged to SNF.  She caught COVID in rehab and has subsequently developed progressive vertigo with nausea and vomiting.  She's also had progressive dysphagia and issues with passage of solid food.  She had barium swallow on 1/19 where she aspirated into the trachea and plan was for MBS at [facility] this week.  She continued to have dizziness with vertigo and N/V.  She's developed progressive weakness.  She also had worsening edema and imaging concerning for HF.  She's been admitted with failure to thrive.    PEG tube placed on 2/9, tube feeds started on 2/10.  Patient was however not able to tolerate bolus feeds.  Hence, continue feeds resumed.

## 2022-01-08 NOTE — Telephone Encounter (Signed)
Tasha Moss called to cx tThursday 01/10/22 apptment -and let [name] know this patient has been admitted to the hospital at [facility].

## 2022-01-08 NOTE — Telephone Encounter (Signed)
This should be addressed by her hospital team and if it's not sent at the time of discharge, we will handle it once she is discharged

## 2022-01-09 ENCOUNTER — Inpatient Hospital Stay (HOSPITAL_COMMUNITY): Payer: Medicare Other

## 2022-01-09 ENCOUNTER — Telehealth: Payer: Self-pay | Admitting: Family Medicine

## 2022-01-09 DIAGNOSIS — R918 Other nonspecific abnormal finding of lung field: Secondary | ICD-10-CM

## 2022-01-09 DIAGNOSIS — R1312 Dysphagia, oropharyngeal phase: Secondary | ICD-10-CM

## 2022-01-09 DIAGNOSIS — H8113 Benign paroxysmal vertigo, bilateral: Secondary | ICD-10-CM | POA: Diagnosis not present

## 2022-01-09 DIAGNOSIS — E440 Moderate protein-calorie malnutrition: Secondary | ICD-10-CM | POA: Insufficient documentation

## 2022-01-09 DIAGNOSIS — R627 Adult failure to thrive: Secondary | ICD-10-CM | POA: Diagnosis not present

## 2022-01-09 DIAGNOSIS — R601 Generalized edema: Secondary | ICD-10-CM | POA: Diagnosis not present

## 2022-01-09 DIAGNOSIS — I509 Heart failure, unspecified: Secondary | ICD-10-CM

## 2022-01-09 LAB — COMPREHENSIVE METABOLIC PANEL
ALT: 14 U/L (ref 0–44)
AST: 16 U/L (ref 15–41)
Albumin: 2.8 g/dL — ABNORMAL LOW (ref 3.5–5.0)
Alkaline Phosphatase: 58 U/L (ref 38–126)
Anion gap: 7 (ref 5–15)
BUN: 13 mg/dL (ref 8–23)
CO2: 32 mmol/L (ref 22–32)
Calcium: 8.8 mg/dL — ABNORMAL LOW (ref 8.9–10.3)
Chloride: 93 mmol/L — ABNORMAL LOW (ref 98–111)
Creatinine, Ser: 0.59 mg/dL (ref 0.44–1.00)
GFR, Estimated: >60 mL/min (ref >60)
Glucose, Bld: 114 mg/dL — ABNORMAL HIGH (ref 70–99)
Potassium: 3.6 mmol/L (ref 3.5–5.1)
Sodium: 132 mmol/L — ABNORMAL LOW (ref 135–145)
Total Bilirubin: 0.8 mg/dL (ref 0.3–1.2)
Total Protein: 5.7 g/dL — ABNORMAL LOW (ref 6.5–8.1)

## 2022-01-09 LAB — CBC WITH DIFFERENTIAL/PLATELET
Abs Immature Granulocytes: 0.03 10*3/uL (ref 0.00–0.07)
Basophils Absolute: 0 10*3/uL (ref 0.0–0.1)
Basophils Relative: 0 %
Eosinophils Absolute: 0.2 10*3/uL (ref 0.0–0.5)
Eosinophils Relative: 3 %
HCT: 36.2 % (ref 36.0–46.0)
Hemoglobin: 12.1 g/dL (ref 12.0–15.0)
Immature Granulocytes: 0 %
Lymphocytes Relative: 16 %
Lymphs Abs: 1.2 10*3/uL (ref 0.7–4.0)
MCH: 31.1 pg (ref 26.0–34.0)
MCHC: 33.4 g/dL (ref 30.0–36.0)
MCV: 93.1 fL (ref 80.0–100.0)
Monocytes Absolute: 0.7 10*3/uL (ref 0.1–1.0)
Monocytes Relative: 10 %
Neutro Abs: 5.2 10*3/uL (ref 1.7–7.7)
Neutrophils Relative %: 71 %
Platelets: 238 10*3/uL (ref 150–400)
RBC: 3.89 MIL/uL (ref 3.87–5.11)
RDW: 15 % (ref 11.5–15.5)
WBC: 7.3 10*3/uL (ref 4.0–10.5)
nRBC: 0 % (ref 0.0–0.2)

## 2022-01-09 LAB — ECHOCARDIOGRAM COMPLETE
Area-P 1/2: 3.22 cm2
Calc EF: 66.4 %
Height: 65 in
Radius: 0.2 cm
S' Lateral: 2.2 cm
Single Plane A2C EF: 69.3 %
Single Plane A4C EF: 65.6 %
Weight: 2063.51 oz

## 2022-01-09 LAB — MAGNESIUM: Magnesium: 1.7 mg/dL (ref 1.7–2.4)

## 2022-01-09 LAB — PHOSPHORUS: Phosphorus: 4.6 mg/dL (ref 2.5–4.6)

## 2022-01-09 MED ORDER — NYSTATIN 100000 UNIT/ML MT SUSP
5.0000 mL | Freq: Four times a day (QID) | OROMUCOSAL | Status: AC
  Administered 2022-01-09 – 2022-01-16 (×18): 500000 [IU] via OROMUCOSAL
  Filled 2022-01-09 (×18): qty 5

## 2022-01-09 MED ORDER — ADULT MULTIVITAMIN W/MINERALS CH
1.0000 | ORAL_TABLET | Freq: Every day | ORAL | Status: DC
  Administered 2022-01-10 – 2022-01-23 (×11): 1 via ORAL
  Filled 2022-01-09 (×11): qty 1

## 2022-01-09 MED ORDER — CEPHALEXIN 500 MG PO CAPS
500.0000 mg | ORAL_CAPSULE | Freq: Two times a day (BID) | ORAL | Status: DC
  Administered 2022-01-09 – 2022-01-13 (×7): 500 mg via ORAL
  Filled 2022-01-09 (×7): qty 1

## 2022-01-09 MED ORDER — ENSURE ENLIVE PO LIQD
237.0000 mL | Freq: Three times a day (TID) | ORAL | Status: DC
  Administered 2022-01-09 – 2022-01-22 (×21): 237 mL via ORAL

## 2022-01-09 NOTE — Progress Notes (Signed)
°  Echocardiogram 2D Echocardiogram has been performed.  Bobbye Charleston 01/09/2022, 12:10 PM

## 2022-01-09 NOTE — Progress Notes (Signed)
Initial Nutrition Assessment  DOCUMENTATION CODES:   Non-severe (moderate) malnutrition in context of chronic illness  INTERVENTION:  - will increase Ensure Plus High Protein from BID to TID, each supplement provides 350 kcal and 20 grams of protein. - communicated in person and via secure chat with SLP concerning acceptable liquids, to include Ensure not needing to be thickened for this patient.     NUTRITION DIAGNOSIS:   Moderate Malnutrition related to chronic illness, dysphagia as evidenced by moderate fat depletion, moderate muscle depletion.  GOAL:   Patient will meet greater than or equal to 90% of their needs  MONITOR:   PO intake, Supplement acceptance, Diet advancement, Labs, Weight trends  REASON FOR ASSESSMENT:   Malnutrition Screening Tool  ASSESSMENT:   86 y.o. female with medical history of BPPV, HTN, HLD, hypothyroidism s/p partial thyroidectomy, GERD, and esophageal stricture. She presented to the ED due to weakness with inability to get up unassisted and progressive dysphagia. She was hospitalized in 10/2021 and underwent L total hip arthroplasty and was discharged to SNF/rehab where she contracted COVID at the beginning of 11/2021. She developed worsening vertigo and N/V with concern for cause being bilateral vestibular disorder. She had barium swallow evaluation on 12/27/21 during which she aspirated contrast. She continues to have dizziness 2/2 vertigo, N/V, and decreased intake of food and liquids.  Patient laying in bed with husband at bedside. Able to communicate with SLP in person prior to visit to patient's room.   Husband provides the majority of the information but patient does contribute also. Swallowing difficulty began ~1 year ago and has worsened over time. Husband shares about barium swallow evaluation earlier this month and yesterday.   Patient has also been experiencing progressive weakness and difficulty ambulating, specifically over the past 1-2  weeks.   Discussed current diet order and options available in the hospital for this diet.   Weight today is 129 lb, weight on 1/30 was 132 lb, weight on 10/08/21 was 143 lb, and weight on 02/02/21 was 153 lb.   This indicates 14 lb weight loss (9.10% body weight) in the past 3 months; significant for time frame. Also indicates 24 lb weight loss (15.7% body weight) in the past 1 year; not significant for time frame.   GI was consulted and saw patient this AM. She is pending ECHO today.    Labs reviewed; Na: 132 mmol/l, Cl: 93 mmol/l.  Medications reviewed; 1000 units cholecalciferol/day, 40 mg IV lasix x1 dose 1/30, 125 mcg oral synthroid/day, 40 mg oral protonix/day, 250 mcg oral cyanocobalamin/day.    NUTRITION - FOCUSED PHYSICAL EXAM:  Flowsheet Row Most Recent Value  Orbital Region Mild depletion  Upper Arm Region Moderate depletion  Thoracic and Lumbar Region Moderate depletion  Buccal Region Moderate depletion  Temple Region Moderate depletion  Clavicle Bone Region Moderate depletion  Clavicle and Acromion Bone Region Moderate depletion  Scapular Bone Region Moderate depletion  Dorsal Hand Mild depletion  Patellar Region Mild depletion  Anterior Thigh Region Mild depletion  Posterior Calf Region No depletion  Edema (RD Assessment) Mild  [BLE]  Hair Reviewed  Eyes Reviewed  Mouth Reviewed  Skin Reviewed  Nails Reviewed       Diet Order:   Diet Order             Diet full liquid Room service appropriate? Yes; Fluid consistency: Nectar Thick  Diet effective now  EDUCATION NEEDS:   Education needs have been addressed  Skin:  Skin Assessment: Skin Integrity Issues: Skin Integrity Issues:: Stage II Stage II: sacrum (per MD note on 1/31)  Last BM:  PTA/unknown  Height:   Ht Readings from Last 1 Encounters:  01/07/22 5\' 5"  (1.651 m)    Weight:   Wt Readings from Last 1 Encounters:  01/09/22 58.5 kg     BMI:  Body mass index is  21.46 kg/m.   Estimated Nutritional Needs:  Kcal:  1750-1950 kcal Protein:  85-95 grams Fluid:  >/= 1.9 L/day      Jarome Matin, MS, RD, LDN Inpatient Clinical Dietitian RD pager # available in [place]  After hours/weekend pager # available in [facility]

## 2022-01-09 NOTE — Consult Note (Signed)
Consultation  Referring Provider:   Dr. Darrick Meigs Primary Care Physician:  Tasha Minium, MD Primary Gastroenterologist:  Dr. Bryan Lemma       Reason for Consultation:     Failure to thrive, dysphagia   Impression    Dysphagia EGD 2011 with gastritis, esophagitis and esophageal stricture postdilatation Upper GI series 12/27/2021 without any evidence of strictures, barium tablet not able to be done. MBS shows pharyngeal dysphagia CT chest/abdomen without acute abnormality CT head without any acute abnormalities other than atrophy  GERD No obvious reflux on upper GI, but patient does have history and has been on Mobic 7.5 mg twice daily, can try to optimize GERD medications while here.  Failure to thrive in adult Multifactorial with recent fracture, COVID, vertigo, dysphagia  Hypothyroidism On thyroid medication   Acute CHF (congestive heart failure) (HCC) Scheduled for echocardiogram today   Sacral decubitus ulcer, stage II [facility])     Plan   86 year old female presenting with failure to thrive, weight loss and new onset heart failure with history of gastritis, esophagitis and esophageal stricture in 2011, recent upper GI 12/27/2021 without strictures, obvious pharyngeal dysphagia on MBS Husband and patient do understand that likely an endoscopy would not be beneficial with upper GI and MBS shown.  They do understand that potentially she can continue on thick nectar/speech therapy diet versus possible PEG tube for nutritional needs. Pending echocardiogram today, patient does not want to make any decisions at this time. Did discuss potential palliative care consult with patient and husband as this could help facilitate patient's discharge whether this is with or without a PEG tube, they agree.  -Consider palliative care consult -Endoscopic evaluation not appropriate at this time. -Pending echocardiogram -Try to optimize GERD medications while here, consider IV  Protonix twice daily while IV access with transition to twice daily. -Avoid NSAIDs  Thank you for your kind consultation, we will continue to follow.         HPI:   Tasha Moss is a 86 y.o. female with past medical history significant for anxiety, arthritis, hypertension, hyperlipidemia, hypothyroidism status post partial thyroidectomy, COVID-19 infection 11/2021, anemia, history of cervical spine surgery, GERD with history of esophageal stricture in 2011 presents to the hospital with progressive weight loss, failure to thrive, dysphagia.  Patient was seen in the office 12/18/2021 for dysphagia, had upper GI series done 12/27/2021 however urine tablet never done due to aspiration of contrast.  During the study however there was no esophageal wall irregularities stomach without mass or ulcerations, no gastric outlet obstruction there were duodenal diverticula but otherwise it was unremarkable. Patient had CT head with mild generalized atrophy but no acute abnormality.  Husband was in the room and gave some of the history. Family lives in [place], daughter is Tasha Moss she is a Licensed conveyancer. Per husband patient's had at least a year of progressive weight loss and dysphagia.  Complicated by dislocation of left acetic hip, COVID, vertigo with nausea. Patient's had 60 pound weight loss over the past year. Patient's been having dysphagia and choking on liquids and solids progressively for a year, feels globulus sensation in upper throat, full feeling in throat, coughing can occasionally clear it. Patient denies odynophagia. \Patient has had reflux intermittently for years was on Protonix 40 mg once outpatient, has also been on Mobic 7.5 mg twice daily for years. Recently iron was added which is increased her nausea and has caused black stool.  Prior to this patient  denies any melena or hematochezia. Patient has had constipation intermittently worsened with iron last bowel movement was 2 to 3 days  ago.  Has had to take milk of magnesia outpatient. Patient denies any abdominal pain but does states she feels like her stomach "stirred up". Patient denies smoking history, alcohol history. Patient is interested in going home  Previous GI work up: Last office visit 12/18/2021 with Carl Best NP for dyphagia  Speech eval/MBS 12/29/21 Which showed severe.  Cervical dysphagia with both sensorimotor and obstructive defect dysphagia.  Possible component of chronic dysphagia, cervical spine position  CT chest, AB pelvis with contrast 01/07/22 1. Evidence of CHF. Cardiomegaly, pulmonary vascular prominence, trace pleural effusions, interstitial edema and possibly early alveolar edema. 2. No acute process identified in the abdomen or pelvis. 3. Colonic diverticulosis. 4. Other chronic findings as described.  UGI 12/27/2021 Aspiration of contrast into trachea at the level of the mainstem bronchi; consider referral to speech therapy for swallowing function study evaluation. Duodenal diverticula incidentally noted. No other significant abnormalities are seen; the 12.5 mm diameter barium tablet was not administered due to aspiration; this could be administered at time of speech swallowing function exam.  EGD 03/01/2010 which showed gastritis, a stone found at the GE junction, early esophageal stricture with few esophageal erosions. Esophagus dilated with 18 mm dilator with mild resistance otherwise was a normal exam. Path report showed gastric body mucosa with minimal chronic inflammation, no evidence of Helicobacter pylori, intestinal metaplasia, dysplasia or malignancy identified.   Colonoscopy by Dr. Deatra Ina 04/17/2010 which showed moderate diverticulosis to the sigmoid colon, no polyps.   She reported several paternal aunts and uncles had stomach cancer and colon cancer.   Past Medical History:  Diagnosis Date   Allergy    seasonal & dust   Anxiety    Arthritis    DJD    Family history of adverse reaction to anesthesia    n/v    GERD (gastroesophageal reflux disease)    History of vertigo    Hyperlipidemia    Hypertension    Hypothyroidism    PONV (postoperative nausea and vomiting)    Thyroid disease    hypothyroidism post Hashimoto's & partial thyroidectomy    Surgical History:  She  has a past surgical history that includes Colonoscopy; Joint replacement ([place]); Spine surgery; Tonsillectomy and adenoidectomy; Thyroidectomy, partial; lumbar disc repair (1989); vitreous detachment; Cataract extraction w/ intraocular lens  implant, bilateral; and Total shoulder arthroplasty (Left, 02/27/2016). Family History:  Her family history includes Cancer in her paternal aunt and paternal uncle; Diabetes in her mother; Heart disease in her father, paternal aunt, and paternal uncle; Hepatitis in her mother; Miscarriages / Stillbirths in her mother; Stroke (age of onset: 58) in her mother. Social History:   reports that she has never smoked. She has never used smokeless tobacco. She reports that she does not drink alcohol and does not use drugs.  Prior to Admission medications   Medication Sig Start Date End Date Taking? Authorizing Provider  acetaminophen (TYLENOL) 500 MG tablet Take 500 mg by mouth every 6 (six) hours as needed for moderate pain.   Yes [provider]  Cholecalciferol (VITAMIN D3) 1000 UNITS CAPS Take 1,000 Units by mouth daily.   Yes [provider]  Cranberry 200 MG CAPS Take 1 capsule by mouth daily.   Yes [provider]  diclofenac Sodium (VOLTAREN) 1 % GEL Apply 2 g topically 4 (four) times daily as needed (Pain). 12/04/21  Yes Gerlene Fee, NP  dimenhyDRINATE (DRAMAMINE) 50 MG tablet Take 50 mg by mouth every 6 (six) hours as needed for dizziness.   Yes [provider]  FLUoxetine (PROZAC) 20 MG capsule Take 1 capsule (20 mg total) by mouth daily. 12/04/21  Yes Gerlene Fee, NP  fluticasone  (FLONASE) 50 MCG/ACT nasal spray Place 1 spray into both nostrils daily.   Yes [provider]  levothyroxine (SYNTHROID) 125 MCG tablet Take 1 tablet (125 mcg total) by mouth daily before breakfast. 12/04/21  Yes Gerlene Fee, NP  loratadine (CLARITIN) 10 MG tablet Take 10 mg by mouth daily.   Yes [provider]  meloxicam (MOBIC) 7.5 MG tablet Take 1 tablet (7.5 mg total) by mouth 2 (two) times daily. 12/04/21  Yes Gerlene Fee, NP  Multiple Vitamin (MULTIVITAMIN PO) Take 400 mg by mouth daily.   Yes [provider]  Nutritional Supplements (ENSURE ENLIVE PO) Take 237 mLs by mouth 2 (two) times daily.   Yes [provider]  ondansetron (ZOFRAN) 8 MG tablet Take 1 tablet (8 mg total) by mouth 3 (three) times daily before meals. Patient taking differently: Take 8 mg by mouth every 8 (eight) hours as needed for vomiting or nausea. 12/04/21  Yes Gerlene Fee, NP  pantoprazole (PROTONIX) 40 MG tablet Take 1 tablet (40 mg total) by mouth daily. 12/04/21  Yes Gerlene Fee, NP  Polyvinyl Alcohol-Povidone (ARTIFICIAL TEARS) 5-6 MG/ML SOLN Place 2 drops into both eyes daily.   Yes [provider]  scopolamine (TRANSDERM-SCOP) 1 MG/3DAYS Place 1 patch (1.5 mg total) onto the skin every 3 (three) days. 12/07/21  Yes Tasha Minium, MD  vitamin B-12 (CYANOCOBALAMIN) 250 MCG tablet Take 250 mcg by mouth daily.   Yes [provider]  polyethylene glycol powder (GLYCOLAX/MIRALAX) 17 GM/SCOOP powder Take 17 g by mouth 2 (two) times daily as needed. Patient not taking: Reported on 12/20/2021 12/07/21   Tasha Minium, MD    Current Facility-Administered Medications  Medication Dose Route Frequency Provider Last Rate Last Admin   acetaminophen (TYLENOL) tablet 500 mg  500 mg Oral Q6H PRN Tu, Ching T, DO       cholecalciferol (VITAMIN D) tablet 1,000 Units  1,000 Units Oral Daily Tu, Ching T, DO   1,000 Units at 01/08/22 1020    dimenhyDRINATE (DRAMAMINE) tablet 50 mg  50 mg Oral Q6H PRN Tu, Ching T, DO       enoxaparin (LOVENOX) injection 40 mg  40 mg Subcutaneous Q24H Tu, Ching T, DO   40 mg at 01/08/22 2154   feeding supplement (ENSURE ENLIVE / ENSURE PLUS) liquid 237 mL  237 mL Oral BID BM Tu, Ching T, DO   237 mL at 01/08/22 1025   FLUoxetine (PROZAC) capsule 20 mg  20 mg Oral Daily Tu, Ching T, DO   20 mg at 01/08/22 1019   furosemide (LASIX) injection 40 mg  40 mg Intravenous Once Tu, Ching T, DO       levothyroxine (SYNTHROID) tablet 125 mcg  125 mcg Oral QAC breakfast Tu, Ching T, DO   125 mcg at 01/09/22 0544   loratadine (CLARITIN) tablet 10 mg  10 mg Oral Daily Tu, Ching T, DO   10 mg at 01/08/22 1020   ondansetron (ZOFRAN) injection 4 mg  4 mg Intravenous Once Elodia Florence., MD       ondansetron [facility]) tablet 8 mg  8 mg Oral Q8H  PRN Ileene Musa T, DO   8 mg at 01/08/22 1428   pantoprazole (PROTONIX) EC tablet 40 mg  40 mg Oral Daily Tu, Ching T, DO   40 mg at 01/08/22 1019   polyvinyl alcohol (LIQUIFILM TEARS) 1.4 % ophthalmic solution 2 drop  2 drop Both Eyes Daily Tu, Ching T, DO   2 drop at 01/08/22 1022   scopolamine (TRANSDERM-SCOP) 1 MG/3DAYS 1.5 mg  1 patch Transdermal Q72H Tu, Ching T, DO   1.5 mg at 01/07/22 2249   vitamin B-12 (CYANOCOBALAMIN) tablet 250 mcg  250 mcg Oral Daily Tu, Ching T, DO   250 mcg at 01/08/22 1021    Allergies as of 01/07/2022 - Review Complete 01/07/2022  Allergen Reaction Noted   Gabapentin     Other  02/19/2016   Morphine      Review of Systems:    Constitutional: No weight loss, fever, chills, weakness or fatigue HEENT: Eyes: No change in vision               Ears, Nose, Throat:  No change in hearing or congestion Skin: No rash or itching Cardiovascular: No chest pain, chest pressure or palpitations   Respiratory: No SOB or cough Gastrointestinal: See HPI and otherwise negative Genitourinary: No dysuria or change in urinary frequency Neurological: No  headache, dizziness or syncope Musculoskeletal: No new muscle or joint pain Hematologic: No bleeding or bruising Psychiatric: No history of depression or anxiety     Physical Exam:  Vital signs in last 24 hours: Temp:  [97.7 F (36.5 C)-98.6 F (37 C)] 98.2 F (36.8 C) (02/01 0524) Pulse Rate:  [80-93] 93 (02/01 0530) Resp:  [16-20] 20 (02/01 0524) BP: (109-159)/(58-91) 145/80 (02/01 0530) SpO2:  [89 %-96 %] 93 % (02/01 0530) Weight:  [58.5 kg-59.2 kg] 58.5 kg (02/01 0530) Last BM Date: 01/05/22  General: Chronically ill-appearing, thin pleasant female Head:  Normocephalic and atraumatic.  Hard of hearing Eyes: sclerae anicteric,conjunctive pink  Heart:  regular rate and rhythm, possible systolic click versus prominent S2 Pulm: Bilateral bases with wheezing/coarse breath sounds Abdomen:  Soft,  prominent  AB,  active  bowel sounds.  No  tenderness . Without guarding and Without rebound, without hepatomegaly. Extremities:  Without edema. Msk:  Symmetrical without gross deformities. Peripheral pulses intact.  Neurologic:  Alert and  oriented x4;  grossly normal neurologically. Skin:   Dry and intact without significant lesions or rashes. Psychiatric: Demonstrates good judgement and reason without abnormal affect or behaviors.  LAB RESULTS: Recent Labs    01/07/22 1534 01/08/22 0805 01/09/22 0344  WBC 6.4 9.6 7.3  HGB 13.7 13.2 12.1  HCT 40.7 38.6 36.2  PLT 278 252 238   BMET Recent Labs    01/08/22 0335 01/08/22 0805 01/09/22 0344  NA 133* 133* 132*  K 3.5 3.6 3.6  CL 96* 94* 93*  CO2 30 31 32  GLUCOSE 115* 133* 114*  BUN 8 9 13   CREATININE 0.42* 0.48 0.59  CALCIUM 8.3* 8.5* 8.8*   LFT Recent Labs    01/09/22 0344  PROT 5.7*  ALBUMIN 2.8*  AST 16  ALT 14  ALKPHOS 58  BILITOT 0.8   PT/INR Recent Labs    01/07/22 1534  LABPROT 12.6  INR 1.0    STUDIES: DG Chest 1 View  Result Date: 01/07/2022 CLINICAL DATA:  Weakness EXAM: CHEST  1 VIEW  COMPARISON:  11/19/2021 chest radiograph. FINDINGS: Partially visualized left total shoulder arthroplasty. Stable cardiomediastinal silhouette with  mild cardiomegaly. No pneumothorax. No pleural effusion. Chronic moderate elevation of the right hemidiaphragm. No pulmonary edema. No acute consolidative airspace disease. Thyroidectomy clips in the medial lower right neck. IMPRESSION: Mild cardiomegaly without pulmonary edema. No active pulmonary disease. Electronically Signed   By: Ilona Sorrel M.D.   On: 01/07/2022 15:56   CT Head Wo Contrast  Result Date: 01/07/2022 CLINICAL DATA:  A female at age 61 presents with dizziness in decreased appetite. EXAM: CT HEAD WITHOUT CONTRAST TECHNIQUE: Contiguous axial images were obtained from the base of the skull through the vertex without intravenous contrast. RADIATION DOSE REDUCTION: This exam was performed according to the departmental dose-optimization program which includes automated exposure control, adjustment of the mA and/or kV according to patient size and/or use of iterative reconstruction technique. COMPARISON:  October 23, 2020. FINDINGS: Brain: No evidence of acute infarction, hemorrhage, hydrocephalus, extra-axial collection or mass lesion/mass effect. Mild generalized atrophy. Vascular: No hyperdense vessel or unexpected calcification. Skull: Normal. Negative for fracture or focal lesion. Sinuses/Orbits: Visualized paranasal sinuses and orbits without acute process Other: None IMPRESSION: 1. No acute intracranial abnormality. 2. Mild generalized atrophy. Electronically Signed   By: Zetta Bills M.D.   On: 01/07/2022 16:51   CT Chest W Contrast  Result Date: 01/07/2022 CLINICAL DATA:  Nausea and vomiting.  Respiratory illness. EXAM: CT CHEST, ABDOMEN, AND PELVIS WITH CONTRAST TECHNIQUE: Multidetector CT imaging of the chest, abdomen and pelvis was performed following the standard protocol during bolus administration of intravenous contrast. RADIATION  DOSE REDUCTION: This exam was performed according to the departmental dose-optimization program which includes automated exposure control, adjustment of the mA and/or kV according to patient size and/or use of iterative reconstruction technique. CONTRAST:  150mL OMNIPAQUE IOHEXOL 300 MG/ML  SOLN COMPARISON:  Chest x-ray earlier the same day FINDINGS: CT CHEST FINDINGS Cardiovascular: Heart is enlarged. No pericardial effusion identified. Main pulmonary artery is normal caliber. Thoracic aorta is tortuous and normal caliber with moderate atherosclerotic plaques. Mediastinum/Nodes: No bulky axillary, mediastinal or hilar lymphadenopathy identified. Multiple calcified prevascular mediastinal lymph nodes noted. Lungs/Pleura: Diffuse interlobular septal thickening with mild mosaic attenuation of the lungs. Pulmonary vascular prominence. Biapical pleural thickening. Trace bilateral pleural effusions with associated mild subsegmental atelectasis at the lung bases. No focal consolidation identified. Diffuse mild bronchial wall thickening. No pneumothorax. Musculoskeletal: Degenerative changes of the spine. No suspicious bony lesions identified. CT ABDOMEN PELVIS FINDINGS Hepatobiliary: Liver is normal in size and contour with no suspicious mass identified. Gallbladder appears normal. No biliary ductal dilatation identified. Pancreas: Unremarkable. No pancreatic ductal dilatation or surrounding inflammatory changes. Spleen: Normal in size without focal abnormality. Adrenals/Urinary Tract: Mild nodularity of the left adrenal gland measuring up to 8 mm in thickness. Symmetric perfusion of the kidneys. No hydronephrosis or enhancing renal mass identified bilaterally. No urinary bladder wall mass identified, significantly limited evaluation due to metallic artifacts from the bilateral hips. Stomach/Bowel: No bowel obstruction, free air or pneumatosis. No bowel wall edema or thickening identified. Colonic diverticulosis. No  evidence of acute appendicitis. Vascular/Lymphatic: Aortic atherosclerosis. No enlarged abdominal or pelvic lymph nodes. Reproductive: Retroverted uterus. No suspicious adnexal mass appreciated. Other: No ascites. Musculoskeletal: Advanced degenerative changes of the lumbar spine. Bilateral hip arthroplasties. No suspicious bony lesions identified. IMPRESSION: 1. Evidence of CHF. Cardiomegaly, pulmonary vascular prominence, trace pleural effusions, interstitial edema and possibly early alveolar edema. 2. No acute process identified in the abdomen or pelvis. 3. Colonic diverticulosis. 4. Other chronic findings as described. Electronically Signed   By: Elberta Fortis  Jimmye Norman M.D.   On: 01/07/2022 16:58   CT Abdomen Pelvis W Contrast  Result Date: 01/07/2022 CLINICAL DATA:  Nausea and vomiting.  Respiratory illness. EXAM: CT CHEST, ABDOMEN, AND PELVIS WITH CONTRAST TECHNIQUE: Multidetector CT imaging of the chest, abdomen and pelvis was performed following the standard protocol during bolus administration of intravenous contrast. RADIATION DOSE REDUCTION: This exam was performed according to the departmental dose-optimization program which includes automated exposure control, adjustment of the mA and/or kV according to patient size and/or use of iterative reconstruction technique. CONTRAST:  148mL OMNIPAQUE IOHEXOL 300 MG/ML  SOLN COMPARISON:  Chest x-ray earlier the same day FINDINGS: CT CHEST FINDINGS Cardiovascular: Heart is enlarged. No pericardial effusion identified. Main pulmonary artery is normal caliber. Thoracic aorta is tortuous and normal caliber with moderate atherosclerotic plaques. Mediastinum/Nodes: No bulky axillary, mediastinal or hilar lymphadenopathy identified. Multiple calcified prevascular mediastinal lymph nodes noted. Lungs/Pleura: Diffuse interlobular septal thickening with mild mosaic attenuation of the lungs. Pulmonary vascular prominence. Biapical pleural thickening. Trace bilateral pleural  effusions with associated mild subsegmental atelectasis at the lung bases. No focal consolidation identified. Diffuse mild bronchial wall thickening. No pneumothorax. Musculoskeletal: Degenerative changes of the spine. No suspicious bony lesions identified. CT ABDOMEN PELVIS FINDINGS Hepatobiliary: Liver is normal in size and contour with no suspicious mass identified. Gallbladder appears normal. No biliary ductal dilatation identified. Pancreas: Unremarkable. No pancreatic ductal dilatation or surrounding inflammatory changes. Spleen: Normal in size without focal abnormality. Adrenals/Urinary Tract: Mild nodularity of the left adrenal gland measuring up to 8 mm in thickness. Symmetric perfusion of the kidneys. No hydronephrosis or enhancing renal mass identified bilaterally. No urinary bladder wall mass identified, significantly limited evaluation due to metallic artifacts from the bilateral hips. Stomach/Bowel: No bowel obstruction, free air or pneumatosis. No bowel wall edema or thickening identified. Colonic diverticulosis. No evidence of acute appendicitis. Vascular/Lymphatic: Aortic atherosclerosis. No enlarged abdominal or pelvic lymph nodes. Reproductive: Retroverted uterus. No suspicious adnexal mass appreciated. Other: No ascites. Musculoskeletal: Advanced degenerative changes of the lumbar spine. Bilateral hip arthroplasties. No suspicious bony lesions identified. IMPRESSION: 1. Evidence of CHF. Cardiomegaly, pulmonary vascular prominence, trace pleural effusions, interstitial edema and possibly early alveolar edema. 2. No acute process identified in the abdomen or pelvis. 3. Colonic diverticulosis. 4. Other chronic findings as described. Electronically Signed   By: Ofilia Neas M.D.   On: 01/07/2022 16:58   DG CHEST PORT 1 VIEW  Result Date: 01/09/2022 CLINICAL DATA:  CHF, abnormal CT EXAM: PORTABLE CHEST 1 VIEW COMPARISON:  Chest radiograph 01/07/2022, CT chest 01/07/2022 FINDINGS: The  cardiomediastinal silhouette is stable. Surgical clips are noted in the upper mediastinum. There is unchanged asymmetric elevation of the right hemidiaphragm. There is no new or worsening focal airspace disease. There is no pulmonary edema. The trace pleural effusions seen on CT are not appreciated on the current study. Overall, aeration is unchanged. The bones are stable, including the sclerotic lesion projecting over the right scapula. IMPRESSION: No significant interval change since 01/07/2022. No new or worsening focal airspace disease. Electronically Signed   By: Valetta Mole M.D.   On: 01/09/2022 08:03   DG Swallowing Func-Speech Pathology  Result Date: 01/08/2022 Table formatting from the original result was not included. Objective Swallowing Evaluation: Type of Study: MBS-Modified Barium Swallow Study  Patient Details Name: KENSINGTON RIOS MRN: [id number] Date of Birth: [birth year] Today's Date: 01/08/2022 Time: SLP Start Time (ACUTE ONLY): 1500 -SLP Stop Time (ACUTE ONLY): 2633 SLP Time Calculation (min) (ACUTE ONLY): 35 min Past  Medical History: Past Medical History: Diagnosis Date  Allergy   seasonal & dust  Anxiety   Arthritis   DJD  Family history of adverse reaction to anesthesia   n/v   GERD (gastroesophageal reflux disease)   History of vertigo   Hyperlipidemia   Hypertension   Hypothyroidism   PONV (postoperative nausea and vomiting)   Thyroid disease   hypothyroidism post Hashimoto's & partial thyroidectomy Past Surgical History: Past Surgical History: Procedure Laterality Date  CATARACT EXTRACTION W/ INTRAOCULAR LENS  IMPLANT, BILATERAL    COLONOSCOPY    diverticulosis  JOINT REPLACEMENT  1995 & 1999  THR bilaterally   lumbar disc repair  1989  Dr Arrie Aran  SPINE SURGERY    cervical ; Dr Vertell Limber  THYROIDECTOMY, PARTIAL    benign nodule  TONSILLECTOMY AND ADENOIDECTOMY    TOTAL SHOULDER ARTHROPLASTY Left 02/27/2016  Procedure: LEFT REVERSE TOTAL SHOULDER ARTHROPLASTY;  Surgeon: Renette Butters,  MD;  Location: [facility];  Service: Orthopedics;  Laterality: Left;  vitreous detachment    Dr Claudean Kinds HPI: per admitting MD note "Patient was hospitalized back in November with a dislocated left total hip arthroplasty status post reduction and was discharged to SNF.  Also contracted COVID in rehab.  She subsequently developed worsening vertigo with nausea and vomiting thought to be due to bilateral vestibular disorder.  She also has progressive worsening dysphagia and issues with passage of solid food. She was referred to GI and had barium swallow on 1/19  but aspirated contrast. Now has modified barium swallow planned at [facility] later this week."  Esophagram was unremarkable except for aspiraiton.  MBS ordered in house for pt due to pt's ongoing dysphagia issues. Per interview with pt and pt's daughter, Tasha Moss, pt has been having dysphagia for approx six months.  Dysphagia coupled with gustatory deficits have caused pt to lose a significant amount of weight - most notably since November.  Pt states food stick in her throat causing her to drink liquids to clear or cough and expectorate.  She has undergone prior esophageal dilatation and daughter reports pt has problems with pills. Pt also with h/o cervical spine surgery and thyroid surgery.  Subjective: pt awake in chair, daughter *(Tasha Bailey, RN and pt HCPOA) present  Recommendations for follow up therapy are one component of a multi-disciplinary discharge planning process, led by the attending physician.  Recommendations may be updated based on patient status, additional functional criteria and insurance authorization. Assessment / Plan / Recommendation Clinical Impressions 01/08/2022 Clinical Impression Patient presents with severe pharyngocervical dysphagia with both sensorimotor and obstructive dysphagia resulting in gross retention of barium throughout pharynx and aspiration of thin liquids.  Pt with inadequate epiglottic deflection due to it contacting posterior  pharyngeal wall resulting in retention.  Her prior ACDF prevents her from conducing postures including chin tuck, head turn,etc.  With thin, pt aspirates due to decreased laryngeal closure and pharyngeal retention spilling into larynx during and after swallow.  Pt does have cough response but this did not clear aspirates.  Pt tested with HOB reclined to 30* to determine if would maintain boluses in posterior pharynx decreasing aspiration - however pt was noted to begin gagging after cued to "hock" and reports gagging occurs frequently during meals and thus repositioned her to upright position.  Decreasing amount to tsps prevented aspiration of thin.  Nectar cup boluses were not aspirated but were retained in pharynx.  Pt swallowing of cracker resulted in severe pharyngeal retention - causing her to  reflexively expectorate x2.  Suspect pt has component of chronic dysphagia due to position of cervical spine with current exacerbation due to current illness/deconditioning.  Concern present for pt's nutrition and aspiration risk given level of dysphagia.  Holly, daughter, present and educated to findings/recommendations. SLP Visit Diagnosis Dysphagia, pharyngoesophageal phase (R13.14);Dysphagia, pharyngeal phase (R13.13) Attention and concentration deficit following -- Frontal lobe and executive function deficit following -- Impact on safety and function Risk for inadequate nutrition/hydration;Moderate aspiration risk   Treatment Recommendations 01/08/2022 Treatment Recommendations Therapy as outlined in treatment plan below   Prognosis 01/08/2022 Prognosis for Safe Diet Advancement Guarded Barriers to Reach Goals Severity of deficits Barriers/Prognosis Comment -- Diet Recommendations 01/08/2022 SLP Diet Recommendations Nectar thick liquid;Thin liquid Liquid Administration via nectar via cup or straw, thin via tsp Medication Administration suspension Compensations Follow solids with liquid;Small sips/bites;Slow rate;  multiple swallows, throat clear and re-swallow Postural Changes Remain semi-upright after after feeds/meals (Comment);Seated upright at 90 degrees   Other Recommendations 01/08/2022 Recommended Consults -- Oral Care Recommendations Oral care QID Other Recommendations Order thickener from pharmacy;Have oral suction available;Clarify dietary restrictions Follow Up Recommendations Long-term institutional care without follow-up therapy Assistance recommended at discharge None Functional Status Assessment Patient has had a recent decline in their functional status and/or demonstrates limited ability to make significant improvements in function in a reasonable and predictable amount of time Frequency and Duration  01/08/2022 Speech Therapy Frequency (ACUTE ONLY) min 1 x/week Treatment Duration 1 week   Oral Phase 01/08/2022 Oral Phase Impaired Oral - Pudding Teaspoon -- Oral - Pudding Cup -- Oral - Honey Teaspoon -- Oral - Honey Cup -- Oral - Nectar Teaspoon WFL Oral - Nectar Cup WFL Oral - Nectar Straw WFL Oral - Thin Teaspoon WFL Oral - Thin Cup WFL Oral - Thin Straw WFL Oral - Puree WFL Oral - Mech Soft WFL Oral - Regular -- Oral - Multi-Consistency -- Oral - Pill -- Oral Phase - Comment --  Pharyngeal Phase 01/08/2022 Pharyngeal Phase Impaired Pharyngeal- Pudding Teaspoon -- Pharyngeal -- Pharyngeal- Pudding Cup -- Pharyngeal -- Pharyngeal- Honey Teaspoon -- Pharyngeal -- Pharyngeal- Honey Cup -- Pharyngeal -- Pharyngeal- Nectar Teaspoon Pharyngeal residue - valleculae;Pharyngeal residue - pyriform;Reduced epiglottic inversion Pharyngeal Material does not enter airway Pharyngeal- Nectar Cup Pharyngeal residue - valleculae;Pharyngeal residue - pyriform;Reduced epiglottic inversion;Penetration/Aspiration during swallow;Penetration/Apiration after swallow;Reduced anterior laryngeal mobility;Reduced laryngeal elevation Pharyngeal Material enters airway, remains ABOVE vocal cords and not ejected out Pharyngeal- Nectar Straw  Pharyngeal residue - valleculae;Pharyngeal residue - pyriform;Reduced epiglottic inversion Pharyngeal Material does not enter airway Pharyngeal- Thin Teaspoon Pharyngeal residue - valleculae;Pharyngeal residue - pyriform;Penetration/Aspiration during swallow Pharyngeal Material does not enter airway Pharyngeal- Thin Cup Pharyngeal residue - valleculae;Pharyngeal residue - pyriform;Reduced epiglottic inversion;Penetration/Aspiration during swallow Pharyngeal Material enters airway, passes BELOW cords and not ejected out despite cough attempt by patient Pharyngeal- Thin Straw Pharyngeal residue - valleculae;Pharyngeal residue - pyriform;Penetration/Aspiration during swallow;Reduced epiglottic inversion;Reduced airway/laryngeal closure;Reduced tongue base retraction Pharyngeal Material enters airway, passes BELOW cords and not ejected out despite cough attempt by patient Pharyngeal- Puree Pharyngeal residue - valleculae;Pharyngeal residue - pyriform;Reduced epiglottic inversion;Reduced pharyngeal peristalsis;Reduced tongue base retraction Pharyngeal Material does not enter airway Pharyngeal- Mechanical Soft Pharyngeal residue - valleculae;Pharyngeal residue - pyriform;Reduced epiglottic inversion;Pharyngeal residue - posterior pharnyx;Reduced tongue base retraction Pharyngeal Material does not enter airway Pharyngeal- Regular NT Pharyngeal -- Pharyngeal- Multi-consistency NT Pharyngeal -- Pharyngeal- Pill NT Pharyngeal -- Pharyngeal Comment Pt tested with HOB reclined to 30* to determine if would maintain boluses in posterior pharynx decreasing  aspiration - however pt was noted to begin gagging after cued to "hock" and reports gagging occurs frequently during meals and thus repositioned her to upright position.  Decreasing amount to tsps prevented aspiration of thin.  Nectar cup boluses were not aspirated but were retained in pharynx.  Pt swallowing of cracker resulted in severe pharyngeal retention - causing her to  reflexively expectorate x2.  Cervical Esophageal Phase  01/08/2022 Cervical Esophageal Phase Impaired Pudding Teaspoon -- Pudding Cup -- Honey Teaspoon -- Honey Cup -- Nectar Teaspoon -- Nectar Cup -- Nectar Straw -- Thin Teaspoon -- Thin Cup -- Thin Straw -- Puree -- Mechanical Soft -- Regular -- Multi-consistency -- Pill -- Cervical Esophageal Comment -- Kathleen Lime, MS [facility] SLP Acute Rehab Services Office [phone] Cell [phone] Macario Golds 01/08/2022, 4:56 PM                       Vladimir Crofts  01/09/2022, 8:09 AM

## 2022-01-09 NOTE — Progress Notes (Addendum)
Orthostatics:  Lying down BP: 141/58 -MAP 80 HR: 86  Sitting: 109/91 HR: 97  Standing: 145/80 MAP 99 HR: 93

## 2022-01-09 NOTE — Progress Notes (Signed)
OT Cancellation Note  Patient Details Name: Tasha Moss MRN: [id number] DOB: 05-Oct-1936   Cancelled Treatment:    Reason Eval/Treat Not Completed: Patient at procedure or test/ unavailable Ot to continue to follow and check back as schedule will allow. Jackelyn Poling OTR/L, [place] Acute Rehabilitation Department Office# [phone] Pager# [phone]   01/09/2022, 11:51 AM

## 2022-01-09 NOTE — Telephone Encounter (Signed)
Called Tasha Moss and informed okay to move forward with discharge unless otherwise determined

## 2022-01-09 NOTE — Progress Notes (Signed)
[facility]  PROGRESS NOTE  Tasha Moss IRW:[id number] DOB: 06-Feb-1936 DOA: 01/07/2022 PCP: [name], MD   Brief HPI:   86 year old female with history of BPPV, hypertension, hyperlipidemia, hypothyroidism s/p partial thyroidectomy, GERD, esophageal stricture presented with generalized weakness.  She was admitted in November with left hip dislocation s/p reduction and discharged to skilled nursing facility.  She got COVID in rehab and subsequently developed vertigo with nausea and vomiting.  She also had progressive dysphagia with issue of passage of solid food.  She underwent barium swallow per GI on 12/27/2021.  She aspirated into trachea and plan was for MBS.  She continued to have dizziness and vertigo with nausea vomiting and also developed progressive weakness.  Patient was admitted  with failure to thrive.    Subjective   Patient seen and examined, complains of discomfort in buttocks and developed erythema bilaterally.  Also has noted white patches in the mouth.   Assessment/Plan:     Failure to thrive -Secondary to oropharyngeal dysphagia -Patient contemplative about PEG tube placement, awaiting echo results to make final decision  Dysphagia -Followed by [facility] as outpatient -Underwent upper GI series as outpatient on 12/27/2021 with aspiration of contrast into trachea at level of mainstem bronchi -Speech therapy was consulted during this hospital admission, MBS showed pharyngeal esophageal and pharyngeal phase dysphagia-chronic dysphagia thought due to position of cervical spine with exacerbation related to her current illness/deconditioning -Speech therapy recommended full liquid, nectar thick diet -GI consulted, tentatively planning for EGD in the next few days -Continue PPI  Benign paroxysmal positional vertigo -Denies dizziness at this time -Continue scopolamine patch, as needed Dramamine -Head CT on 01/07/2022 showed no acute abnormality. -PT/OT,  vascular rehab  Acute on chronic diastolic CHF -Does not appear to be volume overloaded -She received Lasix x1 yesterday -Chest x-ray obtained today shows no worsening of CHF -Echocardiogram obtained today shows EF 65% with grade 1 diastolic dysfunction, also shows mild to moderate mitral regurgitation  Bilateral buttock cellulitis -We will start Keflex 500 mg p.o. twice daily  Oral thrush -Start nystatin 5 ml 4 times daily  Hypothyroidism -Continue Synthroid 125 mcg daily -TSH 6.5, mildly elevated likely from sick euthyroid syndrome -Repeat TSH in 4 to 6 weeks as outpatient  Sacral ligamentous ulcer -Change mattress to air overlay mattress     Medications     cephALEXin  500 mg Oral Q12H   cholecalciferol  1,000 Units Oral Daily   enoxaparin (LOVENOX) injection  40 mg Subcutaneous Q24H   feeding supplement  237 mL Oral TID BM   FLUoxetine  20 mg Oral Daily   furosemide  40 mg Intravenous Once   levothyroxine  125 mcg Oral QAC breakfast   loratadine  10 mg Oral Daily   multivitamin with minerals  1 tablet Oral Daily   nystatin  5 mL Mouth/Throat QID   ondansetron (ZOFRAN) IV  4 mg Intravenous Once   pantoprazole  40 mg Oral Daily   polyvinyl alcohol  2 drop Both Eyes Daily   scopolamine  1 patch Transdermal Q72H   vitamin B-12  250 mcg Oral Daily     Data Reviewed:   CBG:  No results for input(s): GLUCAP in the last 168 hours.  SpO2: 95 %    Vitals:   01/09/22 0524 01/09/22 0528 01/09/22 0530 01/09/22 1304  BP: (!) 141/58 (!) 109/91 (!) 145/80 97/66  Pulse: 81 86 93 91  Resp: 20   17  Temp: 98.2  F (36.8 C)   98.2 F (36.8 C)  TempSrc: Oral   Oral  SpO2: 96% 95% 93% 95%  Weight:   58.5 kg   Height:          Data Reviewed:  Basic Metabolic Panel: Recent Labs  Lab 01/07/22 1534 01/08/22 0335 01/08/22 0805 01/09/22 0344  NA 132* 133* 133* 132*  K 4.4 3.5 3.6 3.6  CL 95* 96* 94* 93*  CO2 30 30 31  32  GLUCOSE 123* 115* 133* 114*  BUN 10 8  9 13   CREATININE 0.55 0.42* 0.48 0.59  CALCIUM 8.7* 8.3* 8.5* 8.8*  MG  --   --   --  1.7  PHOS  --   --   --  4.6    CBC: Recent Labs  Lab 01/07/22 1534 01/08/22 0805 01/09/22 0344  WBC 6.4 9.6 7.3  NEUTROABS 4.6  --  5.2  HGB 13.7 13.2 12.1  HCT 40.7 38.6 36.2  MCV 92.3 92.1 93.1  PLT 278 252 238       Antibiotics: Anti-infectives (From admission, onward)    Start     Dose/Rate Route Frequency Ordered Stop   01/09/22 1630  cephALEXin (KEFLEX) capsule 500 mg        500 mg Oral Every 12 hours 01/09/22 1531          DVT prophylaxis: Lovenox  Code Status: Full code  Family Communication: Discussed with patient's daughter at bedside      Objective    Physical Examination:   General-appears in no acute distress Mouth-oral white patches noted on the tongue, hard palate and soft palate Heart-S1-S2, regular, no murmur auscultated Lungs-clear to auscultation bilaterally, no wheezing or crackles auscultated Abdomen-soft, nontender, no organomegaly Extremities-no edema in the lower extremities Neuro-alert, oriented x3, no focal deficit noted Skin-mild erythema noted on buttocks bilaterally  Status is: Inpatient due to failure to thrive, progressive dysphagia    Pressure Injury 01/07/22 Perineum Right (Active)  01/07/22 2130  Location: Perineum  Location Orientation: Right  Staging:   Wound Description (Comments):   Present on Admission: Yes          [place]   [facility] If 7PM-7AM, please contact night-coverage at [email or web address], Office  [phone]   01/09/2022, 7:57 PM  LOS: 1 day

## 2022-01-09 NOTE — Evaluation (Signed)
Occupational Therapy Evaluation Patient Details Name: Tasha Moss MRN: [id number] DOB: [birth year] Today's Date: 01/09/2022   History of Present Illness Patient is 86 y.o. female presented to [facility] for progressive weakness, dizziness, vertigo, and N/V. patient recently admitted for Lt hip dislocation in November 2022 and discharge to SNF where she caught COVID and ifrst developed vertigo symptoms. PMH signifnicant for HLD, HTN, BPPV, hypothyroidism, thyroidectomy, GERD, anxiety, OA, Lt TSA, bil THA in 1990's.   Clinical Impression   Patient evaluated by Occupational Therapy with no further acute OT needs identified. All education has been completed and the patient has no further questions. Patient and family report that patient is at baseline at this time. Patient was able to transfer from edge of bed with min A for sit to stand with cues to lean forwards with min guard to complete toileting tasks. Patient and husband report that this is her typical assist level since SNF. Family desires for patient to transition home and will get support needed for this.  See below for any follow-up Occupational Therapy or equipment needs. OT is signing off. Thank you for this referral.       Recommendations for follow up therapy are one component of a multi-disciplinary discharge planning process, led by the attending physician.  Recommendations may be updated based on patient status, additional functional criteria and insurance authorization.   Follow Up Recommendations  No OT follow up    Assistance Recommended at Discharge Frequent or constant Supervision/Assistance  Patient can return home with the following A little help with walking and/or transfers;Direct supervision/assist for medications management;Direct supervision/assist for financial management;Assist for transportation;Assistance with cooking/housework;A little help with bathing/dressing/bathroom;Help with stairs or ramp for entrance    Functional  Status Assessment     Equipment Recommendations  None recommended by OT    Recommendations for Other Services       Precautions / Restrictions Precautions Precautions: Fall Restrictions Weight Bearing Restrictions: No      Mobility Bed Mobility Overal bed mobility: Needs Assistance Bed Mobility: Supine to Sit, Sit to Supine     Supine to sit: HOB elevated, Min guard Sit to supine: Min assist Sit to sidelying: Min assist General bed mobility comments: patient needed physical assist to get BLE back into bed and positioning in bed.    Transfers                          Balance Overall balance assessment: Needs assistance Sitting-balance support: Feet supported, Bilateral upper extremity supported Sitting balance-Leahy Scale: Fair     Standing balance support: Bilateral upper extremity supported, Reliant on assistive device for balance Standing balance-Leahy Scale: Poor                             ADL either performed or assessed with clinical judgement   ADL Overall ADL's : At baseline                                       General ADL Comments: patient is able to participate in bed mobility and toileting tasks with min A for intial standing balance with toes noted to be off the floor. husband reported this was typical for her. patient was able to complete toileting tasks with RW with min guard with encouragement for participation in task. patient was  educated on benefits of reducing pure wic use and using real commode. patient and family verbalized understanding. patient's husband endorsed that patient is at baseline for ADLs at this time just weaker from poor intake. patient has family support needed at this time. patients daughter further reported that plan is to take patient home as she wishes and that they would get support if needed to make wishes possible. daughter agreed with OT sign off at this time,     Vision Baseline  Vision/History: 1 Wears glasses Patient Visual Report: No change from baseline       Perception     Praxis      Pertinent Vitals/Pain Pain Assessment Pain Assessment: Faces Faces Pain Scale: Hurts a little bit Pain Location: bottom and neck Pain Descriptors / Indicators: Discomfort Pain Intervention(s): Repositioned     Hand Dominance     Extremity/Trunk Assessment Upper Extremity Assessment Upper Extremity Assessment: Generalized weakness   Lower Extremity Assessment Lower Extremity Assessment: Defer to PT evaluation   Cervical / Trunk Assessment Cervical / Trunk Assessment: Kyphotic   Communication Communication Communication: HOH   Cognition Arousal/Alertness: Awake/alert Behavior During Therapy: Anxious Overall Cognitive Status: Within Functional Limits for tasks assessed                                 General Comments: patient was noted to have increased anxiousness with all attempts at movement secondary to dizziness. patient was overwhelmed with participation in tasks and trying to hear daughters conversation at the same time.     General Comments       Exercises     Shoulder Instructions      Home Living Family/patient expects to be discharged to:: Private residence Living Arrangements: Spouse/significant other;Children Available Help at Discharge: Family Type of Home: House Home Access: [place]: Laundry or work area in basement;Able to live on main level with bedroom/bathroom     Bathroom Shower/Tub: [place]: Conservation officer, nature (2 wheels);Wheelchair - manual;BSC/3in1;Adaptive equipment;Cane - single point;Rollator (4 wheels)   Additional Comments: pt's spouse has been helping her with mobility      Prior Functioning/Environment Prior Level of Function : Independent/Modified Independent             Mobility Comments: pt's daughter is a Marine scientist at [employer] and assist her parents  as needed and able ADLs Comments: pt's husband assists with LB dressing tasks and bathing as needed. patient has SUP/min guard for toileting tasks from husband.        OT Problem List:        OT Treatment/Interventions:      OT Goals(Current goals can be found in the care plan section) Acute Rehab OT Goals Patient Stated Goal: to go home to her recliner OT Goal Formulation: All assessment and education complete, DC therapy  OT Frequency:      Co-evaluation              AM-PAC OT "6 Clicks" Daily Activity     Outcome Measure Help from another person eating meals?: A Little Help from another person taking care of personal grooming?: A Little Help from another person toileting, which includes using toliet, bedpan, or urinal?: A Little Help from another person bathing (including washing, rinsing, drying)?: A Lot Help from another person to put on and taking off regular upper body  clothing?: A Little Help from another person to put on and taking off regular lower body clothing?: A Lot 6 Click Score: 16   End of Session Equipment Utilized During Treatment: Gait belt;Rolling walker (2 wheels) Nurse Communication: Other (comment) (patients desire for voltarin gel for neck)  Activity Tolerance: Patient tolerated treatment well Patient left: in bed;with call bell/phone within reach;with nursing/sitter in room;with family/visitor present;Other (comment) (MD in room)  OT Visit Diagnosis: Unsteadiness on feet (R26.81);Muscle weakness (generalized) (M62.81)                Time: 3074-6002 OT Time Calculation (min): 32 min Charges:  OT General Charges $OT Visit: 1 Visit OT Evaluation $OT Eval Low Complexity: 1 Low OT Treatments $Self Care/Home Management : 8-22 mins  Jackelyn Poling OTR/L, [place] Acute Rehabilitation Department Office# [phone] Pager# [phone]   Marcellina Millin 01/09/2022, 4:10 PM

## 2022-01-09 NOTE — Telephone Encounter (Signed)
Tasha Moss and physical therapist with [facility] called in asking for verbal orders to do pt's physical therapy discharge this week. They were unable to complete it last week.  Please call [phone] with the verbal order and it is ok to LM if no answer.

## 2022-01-09 NOTE — Evaluation (Signed)
Physical Therapy Evaluation Patient Details Name: Tasha Moss MRN: [id number] DOB: [birth year] Today's Date: 01/09/2022  History of Present Illness  Patient is 86 y.o. female presented to [facility] for progressive weakness, dizziness, vertigo, and N/V. patient recently admitted for Lt hip dislocation in November 2022 and discharge to SNF where she caught COVID and ifrst developed vertigo symptoms. PMH signifnicant for HLD, HTN, BPPV, hypothyroidism, thyroidectomy, GERD, anxiety, OA, Lt TSA, bil THA in 1990's.    Clinical Impression  Tasha Moss is 86 y.o. female admitted with above HPI and diagnosis. Patient is currently limited by functional impairments below (see PT problem list). Patient lives with her husband and has been limited with mobility since last hospitalization for Lt hip dislocation in November 2022. Pt requires min assist from spouse for mobility and ADL's and is using RW for household mobility. Patient fearful of mobility and limited cervical ROM limited vestibular testing. Pt has positional symptoms worse with sitting to sidelying compared to supine<>sit or sit<>stand. Performed modified Dix-Hallpike maneuver with symptmom provocation but no nystagmus noted. Will continue testing and attempt to treat as able. Patient will benefit from continued skilled PT interventions to address impairments and progress independence with mobility, recommending SNF placement for ST rehab to improve mobility and safety prior to return home. Acute PT will follow and progress as able.        Recommendations for follow up therapy are one component of a multi-disciplinary discharge planning process, led by the attending physician.  Recommendations may be updated based on patient status, additional functional criteria and insurance authorization.  Follow Up Recommendations Skilled nursing-short term rehab (<3 hours/day) (vs HHPT with 24/7 assist from family if pt progresses)    Assistance Recommended at  Discharge Frequent or constant Supervision/Assistance  Patient can return home with the following  A little help with walking and/or transfers;A little help with bathing/dressing/bathroom;Assistance with cooking/housework;Direct supervision/assist for medications management;Assist for transportation;Help with stairs or ramp for entrance    Equipment Recommendations None recommended by PT  Recommendations for Other Services       Functional Status Assessment Patient has had a recent decline in their functional status and demonstrates the ability to make significant improvements in function in a reasonable and predictable amount of time.     Precautions / Restrictions Precautions Precautions: Fall Restrictions Weight Bearing Restrictions: No       Vestibular Assessment - 01/09/22 0001       Vestibular Assessment   General Observation pt appears anxious and fearful of mobility      Symptom Behavior   Type of Dizziness  Spinning;"World moves"    Symptom Nature Motion provoked;Positional    Aggravating Factors Sit to stand;Supine to sit;Rolling to right;Rolling to left    Relieving Factors No known relieving factors    Progression of Symptoms Worse      Oculomotor Exam   Oculomotor Alignment Normal    Ocular ROM WNL    Spontaneous Absent    Gaze-induced  Right beating nystagmus with L gaze    Smooth Pursuits Saccades    Saccades Poor trajectory    Comment Pt had a retina detachment and limited vision in Rt periphery      Positional Testing   Sidelying Test Sidelying Right;Sidelying Left      Sidelying Right   Sidelying Right Symptoms No nystagmus   reports dizziness     Sidelying Left   Sidelying Left Symptoms No nystagmus   reports dizziness worse than Rt sidelying  Mobility  Bed Mobility Overal bed mobility: Needs Assistance Bed Mobility: Supine to Sit, Sit to Supine, Sidelying to Sit, Sit to Sidelying   Sidelying to sit: Min assist Supine to sit:  Min assist Sit to supine: Min assist Sit to sidelying: Min assist General bed mobility comments: pt requires min assist to initaited transfer to EOB and to move sit to Rt/Lt sidelying due to dizziness. assist to control lowering trunk and raise bil LE's onto bed to return to supine    Transfers Overall transfer level: Needs assistance Equipment used: Rolling walker (2 wheels) Transfers: Sit to/from Stand Sit to Stand: Min assist           General transfer comment: min assist to power up and prevent LOB as pt has posterior lean in standing.    Ambulation/Gait                  Stairs            Wheelchair Mobility    Modified Rankin (Stroke Patients Only)       Balance Overall balance assessment: Needs assistance Sitting-balance support: Feet supported, Bilateral upper extremity supported Sitting balance-Leahy Scale: Fair   Postural control: Posterior lean Standing balance support: Bilateral upper extremity supported, Reliant on assistive device for balance Standing balance-Leahy Scale: Poor                               Pertinent Vitals/Pain Pain Assessment Pain Assessment: No/denies pain    Home Living Family/patient expects to be discharged to:: Private residence Living Arrangements: Spouse/significant other;Children Available Help at Discharge: Family Type of Home: House Home Access: Ramped entrance       Home Layout: Laundry or work area in basement;Able to live on main level with bedroom/bathroom Home Equipment: Conservation officer, nature (2 wheels);Wheelchair - manual;BSC/3in1;Adaptive equipment;Cane - single point;Rollator (4 wheels) Additional Comments: pt's spouse has been helping her with mobility    Prior Function Prior Level of Function : Independent/Modified Independent             Mobility Comments: pt's daughter is a Marine scientist at [employer] and assist her parents as needed and able ADLs Comments: pt's husband assists     Hand  Dominance        Extremity/Trunk Assessment   Upper Extremity Assessment Upper Extremity Assessment: Defer to OT evaluation;Generalized weakness    Lower Extremity Assessment Lower Extremity Assessment: Generalized weakness    Cervical / Trunk Assessment Cervical / Trunk Assessment: Kyphotic  Communication   Communication: HOH  Cognition Arousal/Alertness: Awake/alert Behavior During Therapy: Anxious Overall Cognitive Status: Within Functional Limits for tasks assessed                                 General Comments: pt alert and oriented to self and situation. seems anxious and fearful of mobility secondary to dizziness. pt seems to have difficulty following commands/cues but this may be related to hearing deficits.        General Comments      Exercises     Assessment/Plan    PT Assessment Patient needs continued PT services  PT Problem List Decreased strength;Decreased range of motion;Decreased activity tolerance;Decreased balance;Decreased mobility;Decreased cognition;Decreased knowledge of use of DME;Decreased safety awareness;Decreased knowledge of precautions       PT Treatment Interventions DME instruction;Gait training;Stair training;Functional mobility training;Therapeutic activities;Therapeutic exercise;Balance training;Neuromuscular re-education;Cognitive remediation;Patient/family  education    PT Goals (Current goals can be found in the Care Plan section)  Acute Rehab PT Goals Patient Stated Goal: stop being dizzy PT Goal Formulation: With patient/family Time For Goal Achievement: 01/23/22 Potential to Achieve Goals: Good    Frequency Min 3X/week     Co-evaluation               AM-PAC PT "6 Clicks" Mobility  Outcome Measure Help needed turning from your back to your side while in a flat bed without using bedrails?: A Little Help needed moving from lying on your back to sitting on the side of a flat bed without using bedrails?:  A Little Help needed moving to and from a bed to a chair (including a wheelchair)?: A Little Help needed standing up from a chair using your arms (e.g., wheelchair or bedside chair)?: A Little Help needed to walk in hospital room?: A Lot Help needed climbing 3-5 steps with a railing? : Total 6 Click Score: 15    End of Session Equipment Utilized During Treatment: Gait belt Activity Tolerance: Patient tolerated treatment well (limited by dizziness) Patient left: in bed;with call bell/phone within reach;with bed alarm set;with family/visitor present Nurse Communication: Mobility status PT Visit Diagnosis: Unsteadiness on feet (R26.81);Other abnormalities of gait and mobility (R26.89);Muscle weakness (generalized) (M62.81);Dizziness and giddiness (R42);BPPV;Difficulty in walking, not elsewhere classified (R26.2) BPPV - Right/Left : Right    Time: 3875-6433 PT Time Calculation (min) (ACUTE ONLY): 35 min   Charges:   PT Evaluation $PT Eval Moderate Complexity: 1 Mod PT Treatments $Physical Performance Test: 8-22 mins        Verner Mould, DPT Acute Rehabilitation Services Office [phone] Pager [phone]   Jacques Navy 01/09/2022, 12:46 PM

## 2022-01-10 ENCOUNTER — Ambulatory Visit (HOSPITAL_COMMUNITY): Payer: Medicare Other | Admitting: Speech Pathology

## 2022-01-10 ENCOUNTER — Other Ambulatory Visit (HOSPITAL_COMMUNITY): Payer: Medicare Other

## 2022-01-10 DIAGNOSIS — E440 Moderate protein-calorie malnutrition: Secondary | ICD-10-CM

## 2022-01-10 DIAGNOSIS — R933 Abnormal findings on diagnostic imaging of other parts of digestive tract: Secondary | ICD-10-CM

## 2022-01-10 DIAGNOSIS — R627 Adult failure to thrive: Secondary | ICD-10-CM | POA: Diagnosis not present

## 2022-01-10 DIAGNOSIS — E039 Hypothyroidism, unspecified: Secondary | ICD-10-CM | POA: Diagnosis not present

## 2022-01-10 DIAGNOSIS — H8113 Benign paroxysmal vertigo, bilateral: Secondary | ICD-10-CM | POA: Diagnosis not present

## 2022-01-10 DIAGNOSIS — R634 Abnormal weight loss: Secondary | ICD-10-CM

## 2022-01-10 MED ORDER — POLYETHYLENE GLYCOL 3350 17 G PO PACK
17.0000 g | PACK | Freq: Every day | ORAL | Status: DC
  Administered 2022-01-10 – 2022-01-19 (×6): 17 g via ORAL
  Filled 2022-01-10 (×7): qty 1

## 2022-01-10 MED ORDER — DICLOFENAC SODIUM 1 % EX GEL
2.0000 g | Freq: Four times a day (QID) | CUTANEOUS | Status: AC
  Administered 2022-01-10 – 2022-01-11 (×4): 2 g via TOPICAL
  Filled 2022-01-10: qty 100

## 2022-01-10 NOTE — Progress Notes (Signed)
Physical Therapy Treatment Patient Details Name: Tasha Moss MRN: [id number] DOB: [birth year] Today's Date: 01/10/2022   History of Present Illness Patient is 86 y.o. female presented to [facility] for progressive weakness, dizziness, vertigo, and N/V. patient recently admitted for Lt hip dislocation in November 2022 and discharge to SNF where she caught COVID and ifrst developed vertigo symptoms. PMH signifnicant for HLD, HTN, BPPV, hypothyroidism, thyroidectomy, GERD, anxiety, OA, Lt TSA, bil THA in 1990's.    PT Comments    Patient making steady progress with mobility and dizziness appears to be less limiting today. Pt able to complete sit<>stand and ambulate short distance in room with min assist and use of RW. Cues needed for posture throughout. Pt c/o neck stiffness/soreness and further vestibular testing deferred today. Gentle AROM for cervical spine instructed to pt to complete in supine for pain relief. Acute PT will continue to progress pt as able and assess mobility, balance, and vestibular system as needed.    Recommendations for follow up therapy are one component of a multi-disciplinary discharge planning process, led by the attending physician.  Recommendations may be updated based on patient status, additional functional criteria and insurance authorization.  Follow Up Recommendations  Home health PT     Assistance Recommended at Discharge Frequent or constant Supervision/Assistance  Patient can return home with the following A little help with walking and/or transfers;A little help with bathing/dressing/bathroom;Assistance with cooking/housework;Direct supervision/assist for medications management;Assist for transportation;Help with stairs or ramp for entrance   Equipment Recommendations  None recommended by PT    Recommendations for Other Services       Precautions / Restrictions Precautions Precautions: Fall Restrictions Weight Bearing Restrictions: No     Mobility   Bed Mobility Overal bed mobility: Needs Assistance Bed Mobility: Supine to Sit, Sit to Supine     Supine to sit: HOB elevated, Min guard Sit to supine: Min assist   General bed mobility comments: Min guard for supine>sit with pt taking extra time and using bed rail to pivot and raise trunk. min assist to bring LE's back onto bed and 2+ to boost in bed.    Transfers Overall transfer level: Needs assistance Equipment used: Rolling walker (2 wheels) Transfers: Sit to/from Stand Sit to Stand: Min assist           General transfer comment: Min assist with cues for hand placement to power up from EOB and for reach back to sit on BSC (over toilet) and power up with use of arm rest on BSC to rise. pt's posture improved with cue to tuck butt.    Ambulation/Gait Ambulation/Gait assistance: Min assist Gait Distance (Feet): 30 Feet Assistive device: Rolling walker (2 wheels) Gait Pattern/deviations: Step-through pattern, Decreased step length - right, Decreased step length - left, Decreased stride length, Trunk flexed Gait velocity: decr     General Gait Details: cues for proximity to RW as pt has tendency to move walker too far ahead of self. assist needed for walker and cues to guide direction to negotiate obstacles like doorways in room.   Stairs             Wheelchair Mobility    Modified Rankin (Stroke Patients Only)       Balance Overall balance assessment: Needs assistance Sitting-balance support: Feet supported, Bilateral upper extremity supported Sitting balance-Leahy Scale: Fair   Postural control: Posterior lean Standing balance support: Bilateral upper extremity supported, Reliant on assistive device for balance Standing balance-Leahy Scale: Poor  Cognition Arousal/Alertness: Awake/alert Behavior During Therapy: Anxious Overall Cognitive Status: Within Functional Limits for tasks assessed                                           Exercises Other Exercises Other Exercises: gentle AROM for Cspine in supine: 5x Rt/Lt rotation, 5x chin tuck/ext.    General Comments        Pertinent Vitals/Pain Pain Assessment Pain Assessment: Faces Faces Pain Scale: Hurts a little bit Pain Location: neck Pain Descriptors / Indicators: Discomfort, Sore Pain Intervention(s): Limited activity within patient's tolerance, Monitored during session, Repositioned    Home Living                          Prior Function            PT Goals (current goals can now be found in the care plan section) Acute Rehab PT Goals Patient Stated Goal: stop being dizzy PT Goal Formulation: With patient/family Time For Goal Achievement: 01/23/22 Potential to Achieve Goals: Good Progress towards PT goals: Progressing toward goals    Frequency    Min 3X/week      PT Plan Current plan remains appropriate    Co-evaluation              AM-PAC PT "6 Clicks" Mobility   Outcome Measure  Help needed turning from your back to your side while in a flat bed without using bedrails?: A Little Help needed moving from lying on your back to sitting on the side of a flat bed without using bedrails?: A Little Help needed moving to and from a bed to a chair (including a wheelchair)?: A Little Help needed standing up from a chair using your arms (e.g., wheelchair or bedside chair)?: A Little Help needed to walk in hospital room?: A Little Help needed climbing 3-5 steps with a railing? : Total 6 Click Score: 16    End of Session Equipment Utilized During Treatment: Gait belt Activity Tolerance: Patient tolerated treatment well Patient left: in bed;with call bell/phone within reach;with bed alarm set;with family/visitor present Nurse Communication: Mobility status PT Visit Diagnosis: Unsteadiness on feet (R26.81);Other abnormalities of gait and mobility (R26.89);Muscle weakness (generalized)  (M62.81);Dizziness and giddiness (R42);BPPV;Difficulty in walking, not elsewhere classified (R26.2) BPPV - Right/Left : Right     Time: 4585-9292 PT Time Calculation (min) (ACUTE ONLY): 27 min  Charges:  $Gait Training: 8-22 mins $Therapeutic Activity: 8-22 mins                     Verner Mould, DPT Acute Rehabilitation Services Office [phone] Pager [phone]     Jacques Navy 01/10/2022, 4:30 PM

## 2022-01-10 NOTE — H&P (View-Only) (Signed)
Progress Note   Subjective  Chief Complaint: Failure to thrive, dysphagia  This morning patient is seen with her husband by her bedside.  He explains that she has had a difference in "tone" of her voice overnight and is having difficulty accepting that she can only be on liquids the rest of her life.  They are very anxious about results from echo which I briefly reviewed with them per my understanding and tried to answer some questions.  They asked about upcoming EGD.  Also reported some constipation.  Apparently had tried taking stool softeners however which were not helping then took milk of magnesia which resulted in multiple loose stools and then took Imodium and she has not had a bowel movement in 3 to 4 days   Objective   Vital signs in last 24 hours: Temp:  [98 F (36.7 C)-98.2 F (36.8 C)] 98 F (36.7 C) (02/02 0552) Pulse Rate:  [87-91] 87 (02/02 0552) Resp:  [17-20] 20 (02/02 0552) BP: (97-147)/(66-72) 147/72 (02/02 0552) SpO2:  [92 %-95 %] 92 % (02/02 0552) Last BM Date: 01/05/22 General:   Elderly, frail-appearing white female in NAD Heart:  Regular rate and rhythm; no murmurs Lungs: Respirations even and unlabored, lungs CTA bilaterally Abdomen:  Soft, nontender and nondistended. Normal bowel sounds. Psych:  Cooperative. Normal mood and affect.  Intake/Output from previous day: 02/01 0701 - 02/02 0700 In: 390 [P.O.:390] Out: -    Lab Results: Recent Labs    01/07/22 1534 01/08/22 0805 01/09/22 0344  WBC 6.4 9.6 7.3  HGB 13.7 13.2 12.1  HCT 40.7 38.6 36.2  PLT 278 252 238   BMET Recent Labs    01/08/22 0335 01/08/22 0805 01/09/22 0344  NA 133* 133* 132*  K 3.5 3.6 3.6  CL 96* 94* 93*  CO2 30 31 32  GLUCOSE 115* 133* 114*  BUN 8 9 13   CREATININE 0.42* 0.48 0.59  CALCIUM 8.3* 8.5* 8.8*   LFT Recent Labs    01/09/22 0344  PROT 5.7*  ALBUMIN 2.8*  AST 16  ALT 14  ALKPHOS 58  BILITOT 0.8   PT/INR Recent Labs    01/07/22 1534  LABPROT  12.6  INR 1.0    Studies/Results: DG CHEST PORT 1 VIEW  Result Date: 01/09/2022 CLINICAL DATA:  CHF, abnormal CT EXAM: PORTABLE CHEST 1 VIEW COMPARISON:  Chest radiograph 01/07/2022, CT chest 01/07/2022 FINDINGS: The cardiomediastinal silhouette is stable. Surgical clips are noted in the upper mediastinum. There is unchanged asymmetric elevation of the right hemidiaphragm. There is no new or worsening focal airspace disease. There is no pulmonary edema. The trace pleural effusions seen on CT are not appreciated on the current study. Overall, aeration is unchanged. The bones are stable, including the sclerotic lesion projecting over the right scapula. IMPRESSION: No significant interval change since 01/07/2022. No new or worsening focal airspace disease. Electronically Signed   By: Valetta Mole M.D.   On: 01/09/2022 08:03   DG Swallowing Func-Speech Pathology  Result Date: 01/08/2022 Table formatting from the original result was not included. Objective Swallowing Evaluation: Type of Study: MBS-Modified Barium Swallow Study  Patient Details Name: Tasha Moss MRN: [id number] Date of Birth: January 25, 1936 Today's Date: 01/08/2022 Time: SLP Start Time (ACUTE ONLY): 1500 -SLP Stop Time (ACUTE ONLY): 7782 SLP Time Calculation (min) (ACUTE ONLY): 35 min Past Medical History: Past Medical History: Diagnosis Date  Allergy   seasonal & dust  Anxiety   Arthritis   DJD  Family  history of adverse reaction to anesthesia   n/v   GERD (gastroesophageal reflux disease)   History of vertigo   Hyperlipidemia   Hypertension   Hypothyroidism   PONV (postoperative nausea and vomiting)   Thyroid disease   hypothyroidism post Hashimoto's & partial thyroidectomy Past Surgical History: Past Surgical History: Procedure Laterality Date  CATARACT EXTRACTION W/ INTRAOCULAR LENS  IMPLANT, BILATERAL    COLONOSCOPY    diverticulosis  JOINT REPLACEMENT  1995 & 1999  THR bilaterally   lumbar disc repair  1989  Dr Arrie Aran  SPINE SURGERY     cervical ; Dr Vertell Limber  THYROIDECTOMY, PARTIAL    benign nodule  TONSILLECTOMY AND ADENOIDECTOMY    TOTAL SHOULDER ARTHROPLASTY Left 02/27/2016  Procedure: LEFT REVERSE TOTAL SHOULDER ARTHROPLASTY;  Surgeon: Renette Butters, MD;  Location: [facility];  Service: Orthopedics;  Laterality: Left;  vitreous detachment    Dr Claudean Kinds HPI: per admitting MD note "Patient was hospitalized back in November with a dislocated left total hip arthroplasty status post reduction and was discharged to SNF.  Also contracted COVID in rehab.  She subsequently developed worsening vertigo with nausea and vomiting thought to be due to bilateral vestibular disorder.  She also has progressive worsening dysphagia and issues with passage of solid food. She was referred to GI and had barium swallow on 1/19  but aspirated contrast. Now has modified barium swallow planned at [facility] later this week."  Esophagram was unremarkable except for aspiraiton.  MBS ordered in house for pt due to pt's ongoing dysphagia issues. Per interview with pt and pt's daughter, Tasha Moss, pt has been having dysphagia for approx six months.  Dysphagia coupled with gustatory deficits have caused pt to lose a significant amount of weight - most notably since November.  Pt states food stick in her throat causing her to drink liquids to clear or cough and expectorate.  She has undergone prior esophageal dilatation and daughter reports pt has problems with pills. Pt also with h/o cervical spine surgery and thyroid surgery.  Subjective: pt awake in chair, daughter *(Tasha Bailey, RN and pt HCPOA) present  Recommendations for follow up therapy are one component of a multi-disciplinary discharge planning process, led by the attending physician.  Recommendations may be updated based on patient status, additional functional criteria and insurance authorization. Assessment / Plan / Recommendation Clinical Impressions 01/08/2022 Clinical Impression Patient presents with severe pharyngocervical  dysphagia with both sensorimotor and obstructive dysphagia resulting in gross retention of barium throughout pharynx and aspiration of thin liquids.  Pt with inadequate epiglottic deflection due to it contacting posterior pharyngeal wall resulting in retention.  Her prior ACDF prevents her from conducing postures including chin tuck, head turn,etc.  With thin, pt aspirates due to decreased laryngeal closure and pharyngeal retention spilling into larynx during and after swallow.  Pt does have cough response but this did not clear aspirates.  Pt tested with HOB reclined to 30* to determine if would maintain boluses in posterior pharynx decreasing aspiration - however pt was noted to begin gagging after cued to "hock" and reports gagging occurs frequently during meals and thus repositioned her to upright position.  Decreasing amount to tsps prevented aspiration of thin.  Nectar cup boluses were not aspirated but were retained in pharynx.  Pt swallowing of cracker resulted in severe pharyngeal retention - causing her to reflexively expectorate x2.  Suspect pt has component of chronic dysphagia due to position of cervical spine with current exacerbation due to current illness/deconditioning.  Concern present for pt's nutrition and aspiration risk given level of dysphagia.  Tasha Moss, daughter, present and educated to findings/recommendations. SLP Visit Diagnosis Dysphagia, pharyngoesophageal phase (R13.14);Dysphagia, pharyngeal phase (R13.13) Attention and concentration deficit following -- Frontal lobe and executive function deficit following -- Impact on safety and function Risk for inadequate nutrition/hydration;Moderate aspiration risk   Treatment Recommendations 01/08/2022 Treatment Recommendations Therapy as outlined in treatment plan below   Prognosis 01/08/2022 Prognosis for Safe Diet Advancement Guarded Barriers to Reach Goals Severity of deficits Barriers/Prognosis Comment -- Diet Recommendations 01/08/2022 SLP Diet  Recommendations Nectar thick liquid;Thin liquid Liquid Administration via nectar via cup or straw, thin via tsp Medication Administration suspension Compensations Follow solids with liquid;Small sips/bites;Slow rate; multiple swallows, throat clear and re-swallow Postural Changes Remain semi-upright after after feeds/meals (Comment);Seated upright at 90 degrees   Other Recommendations 01/08/2022 Recommended Consults -- Oral Care Recommendations Oral care QID Other Recommendations Order thickener from pharmacy;Have oral suction available;Clarify dietary restrictions Follow Up Recommendations Long-term institutional care without follow-up therapy Assistance recommended at discharge None Functional Status Assessment Patient has had a recent decline in their functional status and/or demonstrates limited ability to make significant improvements in function in a reasonable and predictable amount of time Frequency and Duration  01/08/2022 Speech Therapy Frequency (ACUTE ONLY) min 1 x/week Treatment Duration 1 week   Oral Phase 01/08/2022 Oral Phase Impaired Oral - Pudding Teaspoon -- Oral - Pudding Cup -- Oral - Honey Teaspoon -- Oral - Honey Cup -- Oral - Nectar Teaspoon WFL Oral - Nectar Cup WFL Oral - Nectar Straw WFL Oral - Thin Teaspoon WFL Oral - Thin Cup WFL Oral - Thin Straw WFL Oral - Puree WFL Oral - Mech Soft WFL Oral - Regular -- Oral - Multi-Consistency -- Oral - Pill -- Oral Phase - Comment --  Pharyngeal Phase 01/08/2022 Pharyngeal Phase Impaired Pharyngeal- Pudding Teaspoon -- Pharyngeal -- Pharyngeal- Pudding Cup -- Pharyngeal -- Pharyngeal- Honey Teaspoon -- Pharyngeal -- Pharyngeal- Honey Cup -- Pharyngeal -- Pharyngeal- Nectar Teaspoon Pharyngeal residue - valleculae;Pharyngeal residue - pyriform;Reduced epiglottic inversion Pharyngeal Material does not enter airway Pharyngeal- Nectar Cup Pharyngeal residue - valleculae;Pharyngeal residue - pyriform;Reduced epiglottic inversion;Penetration/Aspiration during  swallow;Penetration/Apiration after swallow;Reduced anterior laryngeal mobility;Reduced laryngeal elevation Pharyngeal Material enters airway, remains ABOVE vocal cords and not ejected out Pharyngeal- Nectar Straw Pharyngeal residue - valleculae;Pharyngeal residue - pyriform;Reduced epiglottic inversion Pharyngeal Material does not enter airway Pharyngeal- Thin Teaspoon Pharyngeal residue - valleculae;Pharyngeal residue - pyriform;Penetration/Aspiration during swallow Pharyngeal Material does not enter airway Pharyngeal- Thin Cup Pharyngeal residue - valleculae;Pharyngeal residue - pyriform;Reduced epiglottic inversion;Penetration/Aspiration during swallow Pharyngeal Material enters airway, passes BELOW cords and not ejected out despite cough attempt by patient Pharyngeal- Thin Straw Pharyngeal residue - valleculae;Pharyngeal residue - pyriform;Penetration/Aspiration during swallow;Reduced epiglottic inversion;Reduced airway/laryngeal closure;Reduced tongue base retraction Pharyngeal Material enters airway, passes BELOW cords and not ejected out despite cough attempt by patient Pharyngeal- Puree Pharyngeal residue - valleculae;Pharyngeal residue - pyriform;Reduced epiglottic inversion;Reduced pharyngeal peristalsis;Reduced tongue base retraction Pharyngeal Material does not enter airway Pharyngeal- Mechanical Soft Pharyngeal residue - valleculae;Pharyngeal residue - pyriform;Reduced epiglottic inversion;Pharyngeal residue - posterior pharnyx;Reduced tongue base retraction Pharyngeal Material does not enter airway Pharyngeal- Regular NT Pharyngeal -- Pharyngeal- Multi-consistency NT Pharyngeal -- Pharyngeal- Pill NT Pharyngeal -- Pharyngeal Comment Pt tested with HOB reclined to 30* to determine if would maintain boluses in posterior pharynx decreasing aspiration - however pt was noted to begin gagging after cued to "hock" and reports gagging occurs frequently during meals and thus repositioned her to  upright  position.  Decreasing amount to tsps prevented aspiration of thin.  Nectar cup boluses were not aspirated but were retained in pharynx.  Pt swallowing of cracker resulted in severe pharyngeal retention - causing her to reflexively expectorate x2.  Cervical Esophageal Phase  01/08/2022 Cervical Esophageal Phase Impaired Pudding Teaspoon -- Pudding Cup -- Honey Teaspoon -- Honey Cup -- Nectar Teaspoon -- Nectar Cup -- Nectar Straw -- Thin Teaspoon -- Thin Cup -- Thin Straw -- Puree -- Mechanical Soft -- Regular -- Multi-consistency -- Pill -- Cervical Esophageal Comment -- Tasha Lime, MS [facility] SLP Acute Rehab Services Office [phone] Cell [phone] Macario Golds 01/08/2022, 4:56 PM                     ECHOCARDIOGRAM COMPLETE  Result Date: 01/09/2022    ECHOCARDIOGRAM REPORT   Patient Name:   Tasha Moss Date of Exam: 01/09/2022 Medical Rec #:  [id number]      Height:       65.0 in Accession #:    [id number]     Weight:       129.0 lb Date of Birth:  [birth year]       BSA:          1.642 m Patient Age:    69 years       BP:           145/80 mmHg Patient Gender: F              HR:           82 bpm. Exam Location:  Inpatient Procedure: 2D Echo, 3D Echo, Cardiac Doppler and Color Doppler Indications:    I50.40* Unspecified combined systolic (congestive) and diastolic                 (congestive) heart failure  History:        Patient has no prior history of Echocardiogram examinations. CHF                 and Cardiomegaly, Signs/Symptoms:Dizziness/Lightheadedness; Risk                 Factors:Dyslipidemia and Hypertension.  Sonographer:    Roseanna Rainbow RDCS Referring Phys: [id number] [place]  1. Left ventricular ejection fraction by 3D volume is 65 %. The left ventricle has normal function. The left ventricle has no regional wall motion abnormalities. There is mild concentric left ventricular hypertrophy. Left ventricular diastolic parameters are consistent with Grade I diastolic dysfunction (impaired  relaxation).  2. Right ventricular systolic function is normal. The right ventricular size is normal. Tricuspid regurgitation signal is inadequate for assessing PA pressure.  3. The mitral valve is normal in structure. Mild to moderate mitral valve regurgitation. No evidence of mitral stenosis.  4. The aortic valve is normal in structure. Aortic valve regurgitation is trivial. No aortic stenosis is present.  5. The inferior vena cava is normal in size with greater than 50% respiratory variability, suggesting right atrial pressure of 3 mmHg. FINDINGS  Left Ventricle: Left ventricular ejection fraction by 3D volume is 65 %. The left ventricle has normal function. The left ventricle has no regional wall motion abnormalities. The left ventricular internal cavity size was normal in size. There is mild concentric left ventricular hypertrophy. Left ventricular diastolic parameters are consistent with Grade I diastolic dysfunction (impaired relaxation). Right Ventricle: The right ventricular size is normal. No increase in right ventricular wall thickness. Right ventricular systolic  function is normal. Tricuspid regurgitation signal is inadequate for assessing PA pressure. Left Atrium: Left atrial size was normal in size. Right Atrium: Right atrial size was normal in size. Pericardium: There is no evidence of pericardial effusion. Mitral Valve: The mitral valve is normal in structure. Mild to moderate mitral valve regurgitation. No evidence of mitral valve stenosis. Tricuspid Valve: The tricuspid valve is normal in structure. Tricuspid valve regurgitation is trivial. No evidence of tricuspid stenosis. Aortic Valve: The aortic valve is normal in structure. Aortic valve regurgitation is trivial. No aortic stenosis is present. Pulmonic Valve: The pulmonic valve was normal in structure. Pulmonic valve regurgitation is not visualized. No evidence of pulmonic stenosis. Aorta: The aortic root is normal in size and structure.  Venous: The inferior vena cava is normal in size with greater than 50% respiratory variability, suggesting right atrial pressure of 3 mmHg. IAS/Shunts: No atrial level shunt detected by color flow Doppler.  LEFT VENTRICLE PLAX 2D LVIDd:         3.50 cm         Diastology LVIDs:         2.20 cm         LV e' medial:    5.98 cm/s LV PW:         1.00 cm         LV E/e' medial:  10.5 LV IVS:        1.10 cm         LV e' lateral:   6.42 cm/s LVOT diam:     2.10 cm         LV E/e' lateral: 9.8 LV SV:         78 LV SV Index:   47 LVOT Area:     3.46 cm        3D Volume EF                                LV 3D EF:    Left                                             ventricul LV Volumes (MOD)                            ar LV vol d, MOD    64.4 ml                    ejection A2C:                                        fraction LV vol d, MOD    49.4 ml                    by 3D A4C:                                        volume is LV vol s, MOD    19.8 ml  65 %. A2C: LV vol s, MOD    17.0 ml A4C:                           3D Volume EF: LV SV MOD A2C:   44.6 ml       3D EF:        65 % LV SV MOD A4C:   49.4 ml       LV EDV:       96 ml LV SV MOD BP:    38.1 ml       LV ESV:       33 ml                                LV SV:        63 ml RIGHT VENTRICLE RV S prime:     11.60 cm/s TAPSE (M-mode): 1.3 cm LEFT ATRIUM             Index        RIGHT ATRIUM          Index LA diam:        2.30 cm 1.40 cm/m   RA Area:     6.60 cm LA Vol (A2C):   33.4 ml 20.35 ml/m  RA Volume:   9.78 ml  5.96 ml/m LA Vol (A4C):   26.3 ml 16.02 ml/m LA Biplane Vol: 29.4 ml 17.91 ml/m  AORTIC VALVE             PULMONIC VALVE LVOT Vmax:   129.00 cm/s PR End Diast Vel: 1.57 msec LVOT Vmean:  81.500 cm/s LVOT VTI:    0.225 m  AORTA Ao Root diam: 3.20 cm Ao Asc diam:  3.30 cm MITRAL VALVE MV Area (PHT): 3.22 cm    SHUNTS MV Decel Time: 236 msec    Systemic VTI:  0.22 m MR PISA:        0.25 cm   Systemic Diam: 2.10 cm MR PISA Radius:  0.20 cm MV E velocity: 62.90 cm/s MV A velocity: 78.20 cm/s MV E/A ratio:  0.80 Kardie Tobb DO Electronically signed by Berniece Salines DO Signature Date/Time: 01/09/2022/5:48:42 PM    Final        Assessment / Plan:   Assessment: 1.  Dysphagia: EGD 2011 with gastritis, esophagitis and esophageal stricture post dilation, upper GI series 12/27/2021 without any evidence of stricture, barium tablet not able to be done, MBS with pharyngeal dysphagia, CT chest/abdomen without acute abnormality, CT head without any acute abnormality 2.  GERD: No obvious reflux with patient does have history 3.  Failure to thrive in adult: Multifactorial with recent fracture, COVID, vertigo, dysphagia 4.  Acute CHF 5.  Sacral decubitus ulcer, stage II  Plan: 1.  Briefly reviewed recent echo with the patient and her husband today.  It is somewhat reassuring with normal ejection fraction.  I do not think her heart failure would prohibit her from being under anesthesia if that is what she would like to do. 2.  Discussed possible EGD with the patient and her husband.  Explained that this would not be done for a curative reason, but if there is something that is adding to her problems and may be able to be fixed such as stricture, yeast or something else contributing to her symptoms.  The idea would be to lessen the degree  of her symptoms but we would not be able to fix them completely.  I have gone ahead and schedule patient for an EGD tomorrow afternoon with Dr. Rush Landmark.  He can discuss further with them and if plans change it can be canceled.  An order is also been placed for n.p.o. at midnight. 3.  Patient and her husband would like their daughter Tasha Moss called who works in surgery here in the [place]. [phone] once we decide on plans going forward 4. Added MIralax qd for constipation  Thanks for your kind consultation, we will continue to follow.    LOS: 2 days   Levin Erp  01/10/2022, 9:32 AM

## 2022-01-10 NOTE — Progress Notes (Addendum)
[facility]  PROGRESS NOTE  Tasha Moss OQH:[id number] DOB: 1936-11-24 DOA: 01/07/2022 PCP: [name], MD   Brief HPI:   86 year old female with history of BPPV, hypertension, hyperlipidemia, hypothyroidism s/p partial thyroidectomy, GERD, esophageal stricture presented with generalized weakness.  She was admitted in November with left hip dislocation s/p reduction and discharged to skilled nursing facility.  She got COVID in rehab and subsequently developed vertigo with nausea and vomiting.  She also had progressive dysphagia with issue of passage of solid food.  She underwent barium swallow per GI on 12/27/2021.  She aspirated into trachea and plan was for MBS.  She continued to have dizziness and vertigo with nausea vomiting and also developed progressive weakness.  Patient was admitted  with failure to thrive.    Subjective   Patient seen and examined, denies any complaints.  Echocardiogram obtained yesterday shows grade 1 diastolic dysfunction with EF 65%.  No shortness of breath.   Assessment/Plan:     Failure to thrive -Secondary to oropharyngeal dysphagia -Patient contemplative about PEG tube placement, awaiting echo results to make final decision  Dysphagia -Followed by [facility] as outpatient -Underwent upper GI series as outpatient on 12/27/2021 with aspiration of contrast into trachea at level of mainstem bronchi -Speech therapy was consulted during this hospital admission, MBS showed pharyngeal esophageal and pharyngeal phase dysphagia-chronic dysphagia thought due to position of cervical spine with exacerbation related to her current illness/deconditioning -Speech therapy recommended full liquid, nectar thick diet -GI consulted, tentatively planning for EGD tomorrow -Continue PPI  Benign paroxysmal positional vertigo -Denies dizziness at this time -Continue scopolamine patch, as needed Dramamine -Head CT on 01/07/2022 showed no acute abnormality. -PT/OT,  vestibular  rehab  Acute on chronic diastolic CHF -Does not appear to be volume overloaded -She received Lasix x1 yesterday -Chest x-ray obtained today shows no worsening of CHF -Echocardiogram obtained today shows EF 65% with grade 1 diastolic dysfunction, also shows mild to moderate mitral regurgitation  Bilateral buttock cellulitis -Significantly improved after starting  Keflex 500 mg p.o. twice daily  Oral thrush -Cleared after starting  nystatin 5 ml 4 times daily  Hypothyroidism -Continue Synthroid 125 mcg daily -TSH 6.5, mildly elevated likely from sick euthyroid syndrome -Repeat TSH in 4 to 6 weeks as outpatient  Sacral lulcer -Change mattress to air overlay mattress     Medications     cephALEXin  500 mg Oral Q12H   cholecalciferol  1,000 Units Oral Daily   enoxaparin (LOVENOX) injection  40 mg Subcutaneous Q24H   feeding supplement  237 mL Oral TID BM   FLUoxetine  20 mg Oral Daily   furosemide  40 mg Intravenous Once   levothyroxine  125 mcg Oral QAC breakfast   loratadine  10 mg Oral Daily   multivitamin with minerals  1 tablet Oral Daily   nystatin  5 mL Mouth/Throat QID   ondansetron (ZOFRAN) IV  4 mg Intravenous Once   pantoprazole  40 mg Oral Daily   polyethylene glycol  17 g Oral Daily   polyvinyl alcohol  2 drop Both Eyes Daily   scopolamine  1 patch Transdermal Q72H   vitamin B-12  250 mcg Oral Daily     Data Reviewed:   CBG:  No results for input(s): GLUCAP in the last 168 hours.  SpO2: 92 %    Vitals:   01/09/22 0530 01/09/22 1304 01/09/22 2021 01/10/22 0552  BP: (!) 145/80 97/66 (!) 141/70 (!) 147/72  Pulse: 93  91 90 87  Resp:  17 20 20   Temp:  98.2 F (36.8 C) 98.1 F (36.7 C) 98 F (36.7 C)  TempSrc:  Oral Oral   SpO2: 93% 95% 92% 92%  Weight: 58.5 kg     Height:          Data Reviewed:  Basic Metabolic Panel: Recent Labs  Lab 01/07/22 1534 01/08/22 0335 01/08/22 0805 01/09/22 0344  NA 132* 133* 133* 132*  K 4.4  3.5 3.6 3.6  CL 95* 96* 94* 93*  CO2 30 30 31  32  GLUCOSE 123* 115* 133* 114*  BUN 10 8 9 13   CREATININE 0.55 0.42* 0.48 0.59  CALCIUM 8.7* 8.3* 8.5* 8.8*  MG  --   --   --  1.7  PHOS  --   --   --  4.6    CBC: Recent Labs  Lab 01/07/22 1534 01/08/22 0805 01/09/22 0344  WBC 6.4 9.6 7.3  NEUTROABS 4.6  --  5.2  HGB 13.7 13.2 12.1  HCT 40.7 38.6 36.2  MCV 92.3 92.1 93.1  PLT 278 252 238       Antibiotics: Anti-infectives (From admission, onward)    Start     Dose/Rate Route Frequency Ordered Stop   01/09/22 1630  cephALEXin (KEFLEX) capsule 500 mg        500 mg Oral Every 12 hours 01/09/22 1531          DVT prophylaxis: Lovenox  Code Status: Full code  Family Communication: Discussed with patient's daughter at bedside      Objective    Physical Examination:   General-appears in no acute distress Heart-S1-S2, regular, no murmur auscultated Mouth-white patches in the mouth have improved significantly Lungs-clear to auscultation bilaterally, no wheezing or crackles auscultated Abdomen-soft, nontender, no organomegaly Extremities-no edema in the lower extremities Neuro-alert, oriented x3, no focal deficit noted Skin-significantly improved erythema on both buttocks, nontender evaluation  Status is: Inpatient due to failure to thrive, progressive dysphagia    Pressure Injury 01/07/22 Perineum Right (Active)  01/07/22 2130  Location: Perineum  Location Orientation: Right  Staging:   Wound Description (Comments):   Present on Admission: Yes          [place]   [facility] If 7PM-7AM, please contact night-coverage at [email or web address], Office  [phone]   01/10/2022, 5:08 PM  LOS: 2 days

## 2022-01-10 NOTE — Progress Notes (Addendum)
Progress Note   Subjective  Chief Complaint: Failure to thrive, dysphagia  This morning patient is seen with her husband by her bedside.  He explains that she has had a difference in "tone" of her voice overnight and is having difficulty accepting that she can only be on liquids the rest of her life.  They are very anxious about results from echo which I briefly reviewed with them per my understanding and tried to answer some questions.  They asked about upcoming EGD.  Also reported some constipation.  Apparently had tried taking stool softeners however which were not helping then took milk of magnesia which resulted in multiple loose stools and then took Imodium and she has not had a bowel movement in 3 to 4 days   Objective   Vital signs in last 24 hours: Temp:  [98 F (36.7 C)-98.2 F (36.8 C)] 98 F (36.7 C) (02/02 0552) Pulse Rate:  [87-91] 87 (02/02 0552) Resp:  [17-20] 20 (02/02 0552) BP: (97-147)/(66-72) 147/72 (02/02 0552) SpO2:  [92 %-95 %] 92 % (02/02 0552) Last BM Date: 01/05/22 General:   Elderly, frail-appearing white female in NAD Heart:  Regular rate and rhythm; no murmurs Lungs: Respirations even and unlabored, lungs CTA bilaterally Abdomen:  Soft, nontender and nondistended. Normal bowel sounds. Psych:  Cooperative. Normal mood and affect.  Intake/Output from previous day: 02/01 0701 - 02/02 0700 In: 390 [P.O.:390] Out: -    Lab Results: Recent Labs    01/07/22 1534 01/08/22 0805 01/09/22 0344  WBC 6.4 9.6 7.3  HGB 13.7 13.2 12.1  HCT 40.7 38.6 36.2  PLT 278 252 238   BMET Recent Labs    01/08/22 0335 01/08/22 0805 01/09/22 0344  NA 133* 133* 132*  K 3.5 3.6 3.6  CL 96* 94* 93*  CO2 30 31 32  GLUCOSE 115* 133* 114*  BUN 8 9 13   CREATININE 0.42* 0.48 0.59  CALCIUM 8.3* 8.5* 8.8*   LFT Recent Labs    01/09/22 0344  PROT 5.7*  ALBUMIN 2.8*  AST 16  ALT 14  ALKPHOS 58  BILITOT 0.8   PT/INR Recent Labs    01/07/22 1534  LABPROT  12.6  INR 1.0    Studies/Results: DG CHEST PORT 1 VIEW  Result Date: 01/09/2022 CLINICAL DATA:  CHF, abnormal CT EXAM: PORTABLE CHEST 1 VIEW COMPARISON:  Chest radiograph 01/07/2022, CT chest 01/07/2022 FINDINGS: The cardiomediastinal silhouette is stable. Surgical clips are noted in the upper mediastinum. There is unchanged asymmetric elevation of the right hemidiaphragm. There is no new or worsening focal airspace disease. There is no pulmonary edema. The trace pleural effusions seen on CT are not appreciated on the current study. Overall, aeration is unchanged. The bones are stable, including the sclerotic lesion projecting over the right scapula. IMPRESSION: No significant interval change since 01/07/2022. No new or worsening focal airspace disease. Electronically Signed   By: Valetta Mole M.D.   On: 01/09/2022 08:03   DG Swallowing Func-Speech Pathology  Result Date: 01/08/2022 Table formatting from the original result was not included. Objective Swallowing Evaluation: Type of Study: MBS-Modified Barium Swallow Study  Patient Details Name: Tasha Moss MRN: [id number] Date of Birth: 01-18-1936 Today's Date: 01/08/2022 Time: SLP Start Time (ACUTE ONLY): 1500 -SLP Stop Time (ACUTE ONLY): 0175 SLP Time Calculation (min) (ACUTE ONLY): 35 min Past Medical History: Past Medical History: Diagnosis Date  Allergy   seasonal & dust  Anxiety   Arthritis   DJD  Family  history of adverse reaction to anesthesia   n/v   GERD (gastroesophageal reflux disease)   History of vertigo   Hyperlipidemia   Hypertension   Hypothyroidism   PONV (postoperative nausea and vomiting)   Thyroid disease   hypothyroidism post Hashimoto's & partial thyroidectomy Past Surgical History: Past Surgical History: Procedure Laterality Date  CATARACT EXTRACTION W/ INTRAOCULAR LENS  IMPLANT, BILATERAL    COLONOSCOPY    diverticulosis  JOINT REPLACEMENT  1995 & 1999  THR bilaterally   lumbar disc repair  1989  Dr Arrie Aran  SPINE SURGERY     cervical ; Dr Vertell Limber  THYROIDECTOMY, PARTIAL    benign nodule  TONSILLECTOMY AND ADENOIDECTOMY    TOTAL SHOULDER ARTHROPLASTY Left 02/27/2016  Procedure: LEFT REVERSE TOTAL SHOULDER ARTHROPLASTY;  Surgeon: Renette Butters, MD;  Location: [place];  Service: Orthopedics;  Laterality: Left;  vitreous detachment    Dr Claudean Kinds HPI: per admitting MD note "Patient was hospitalized back in November with a dislocated left total hip arthroplasty status post reduction and was discharged to SNF.  Also contracted COVID in rehab.  She subsequently developed worsening vertigo with nausea and vomiting thought to be due to bilateral vestibular disorder.  She also has progressive worsening dysphagia and issues with passage of solid food. She was referred to GI and had barium swallow on 1/19  but aspirated contrast. Now has modified barium swallow planned at [facility] later this week."  Esophagram was unremarkable except for aspiraiton.  MBS ordered in house for pt due to pt's ongoing dysphagia issues. Per interview with pt and pt's daughter, [name], pt has been having dysphagia for approx six months.  Dysphagia coupled with gustatory deficits have caused pt to lose a significant amount of weight - most notably since November.  Pt states food stick in her throat causing her to drink liquids to clear or cough and expectorate.  She has undergone prior esophageal dilatation and daughter reports pt has problems with pills. Pt also with h/o cervical spine surgery and thyroid surgery.  Subjective: pt awake in chair, daughter *([name], RN and pt HCPOA) present  Recommendations for follow up therapy are one component of a multi-disciplinary discharge planning process, led by the attending physician.  Recommendations may be updated based on patient status, additional functional criteria and insurance authorization. Assessment / Plan / Recommendation Clinical Impressions 01/08/2022 Clinical Impression Patient presents with severe pharyngocervical  dysphagia with both sensorimotor and obstructive dysphagia resulting in gross retention of barium throughout pharynx and aspiration of thin liquids.  Pt with inadequate epiglottic deflection due to it contacting posterior pharyngeal wall resulting in retention.  Her prior ACDF prevents her from conducing postures including chin tuck, head turn,etc.  With thin, pt aspirates due to decreased laryngeal closure and pharyngeal retention spilling into larynx during and after swallow.  Pt does have cough response but this did not clear aspirates.  Pt tested with HOB reclined to 30* to determine if would maintain boluses in posterior pharynx decreasing aspiration - however pt was noted to begin gagging after cued to "hock" and reports gagging occurs frequently during meals and thus repositioned her to upright position.  Decreasing amount to tsps prevented aspiration of thin.  Nectar cup boluses were not aspirated but were retained in pharynx.  Pt swallowing of cracker resulted in severe pharyngeal retention - causing her to reflexively expectorate x2.  Suspect pt has component of chronic dysphagia due to position of cervical spine with current exacerbation due to current illness/deconditioning.  Concern present for pt's nutrition and aspiration risk given level of dysphagia.  Holly, daughter, present and educated to findings/recommendations. SLP Visit Diagnosis Dysphagia, pharyngoesophageal phase (R13.14);Dysphagia, pharyngeal phase (R13.13) Attention and concentration deficit following -- Frontal lobe and executive function deficit following -- Impact on safety and function Risk for inadequate nutrition/hydration;Moderate aspiration risk   Treatment Recommendations 01/08/2022 Treatment Recommendations Therapy as outlined in treatment plan below   Prognosis 01/08/2022 Prognosis for Safe Diet Advancement Guarded Barriers to Reach Goals Severity of deficits Barriers/Prognosis Comment -- Diet Recommendations 01/08/2022 SLP Diet  Recommendations Nectar thick liquid;Thin liquid Liquid Administration via nectar via cup or straw, thin via tsp Medication Administration suspension Compensations Follow solids with liquid;Small sips/bites;Slow rate; multiple swallows, throat clear and re-swallow Postural Changes Remain semi-upright after after feeds/meals (Comment);Seated upright at 90 degrees   Other Recommendations 01/08/2022 Recommended Consults -- Oral Care Recommendations Oral care QID Other Recommendations Order thickener from pharmacy;Have oral suction available;Clarify dietary restrictions Follow Up Recommendations Long-term institutional care without follow-up therapy Assistance recommended at discharge None Functional Status Assessment Patient has had a recent decline in their functional status and/or demonstrates limited ability to make significant improvements in function in a reasonable and predictable amount of time Frequency and Duration  01/08/2022 Speech Therapy Frequency (ACUTE ONLY) min 1 x/week Treatment Duration 1 week   Oral Phase 01/08/2022 Oral Phase Impaired Oral - Pudding Teaspoon -- Oral - Pudding Cup -- Oral - Honey Teaspoon -- Oral - Honey Cup -- Oral - Nectar Teaspoon WFL Oral - Nectar Cup WFL Oral - Nectar Straw WFL Oral - Thin Teaspoon WFL Oral - Thin Cup WFL Oral - Thin Straw WFL Oral - Puree WFL Oral - Mech Soft WFL Oral - Regular -- Oral - Multi-Consistency -- Oral - Pill -- Oral Phase - Comment --  Pharyngeal Phase 01/08/2022 Pharyngeal Phase Impaired Pharyngeal- Pudding Teaspoon -- Pharyngeal -- Pharyngeal- Pudding Cup -- Pharyngeal -- Pharyngeal- Honey Teaspoon -- Pharyngeal -- Pharyngeal- Honey Cup -- Pharyngeal -- Pharyngeal- Nectar Teaspoon Pharyngeal residue - valleculae;Pharyngeal residue - pyriform;Reduced epiglottic inversion Pharyngeal Material does not enter airway Pharyngeal- Nectar Cup Pharyngeal residue - valleculae;Pharyngeal residue - pyriform;Reduced epiglottic inversion;Penetration/Aspiration during  swallow;Penetration/Apiration after swallow;Reduced anterior laryngeal mobility;Reduced laryngeal elevation Pharyngeal Material enters airway, remains ABOVE vocal cords and not ejected out Pharyngeal- Nectar Straw Pharyngeal residue - valleculae;Pharyngeal residue - pyriform;Reduced epiglottic inversion Pharyngeal Material does not enter airway Pharyngeal- Thin Teaspoon Pharyngeal residue - valleculae;Pharyngeal residue - pyriform;Penetration/Aspiration during swallow Pharyngeal Material does not enter airway Pharyngeal- Thin Cup Pharyngeal residue - valleculae;Pharyngeal residue - pyriform;Reduced epiglottic inversion;Penetration/Aspiration during swallow Pharyngeal Material enters airway, passes BELOW cords and not ejected out despite cough attempt by patient Pharyngeal- Thin Straw Pharyngeal residue - valleculae;Pharyngeal residue - pyriform;Penetration/Aspiration during swallow;Reduced epiglottic inversion;Reduced airway/laryngeal closure;Reduced tongue base retraction Pharyngeal Material enters airway, passes BELOW cords and not ejected out despite cough attempt by patient Pharyngeal- Puree Pharyngeal residue - valleculae;Pharyngeal residue - pyriform;Reduced epiglottic inversion;Reduced pharyngeal peristalsis;Reduced tongue base retraction Pharyngeal Material does not enter airway Pharyngeal- Mechanical Soft Pharyngeal residue - valleculae;Pharyngeal residue - pyriform;Reduced epiglottic inversion;Pharyngeal residue - posterior pharnyx;Reduced tongue base retraction Pharyngeal Material does not enter airway Pharyngeal- Regular NT Pharyngeal -- Pharyngeal- Multi-consistency NT Pharyngeal -- Pharyngeal- Pill NT Pharyngeal -- Pharyngeal Comment Pt tested with HOB reclined to 30* to determine if would maintain boluses in posterior pharynx decreasing aspiration - however pt was noted to begin gagging after cued to "hock" and reports gagging occurs frequently during meals and thus repositioned her to  upright  position.  Decreasing amount to tsps prevented aspiration of thin.  Nectar cup boluses were not aspirated but were retained in pharynx.  Pt swallowing of cracker resulted in severe pharyngeal retention - causing her to reflexively expectorate x2.  Cervical Esophageal Phase  01/08/2022 Cervical Esophageal Phase Impaired Pudding Teaspoon -- Pudding Cup -- Honey Teaspoon -- Honey Cup -- Nectar Teaspoon -- Nectar Cup -- Nectar Straw -- Thin Teaspoon -- Thin Cup -- Thin Straw -- Puree -- Mechanical Soft -- Regular -- Multi-consistency -- Pill -- Cervical Esophageal Comment -- Kathleen Lime, MS [facility] SLP Acute Rehab Services Office [phone] Cell [phone] Macario Golds 01/08/2022, 4:56 PM                     ECHOCARDIOGRAM COMPLETE  Result Date: 01/09/2022    ECHOCARDIOGRAM REPORT   Patient Name:   Tasha Moss Date of Exam: 01/09/2022 Medical Rec #:  [id number]      Height:       65.0 in Accession #:    [id number]     Weight:       129.0 lb Date of Birth:  1936-09-16       BSA:          1.642 m Patient Age:    86 years       BP:           145/80 mmHg Patient Gender: F              HR:           82 bpm. Exam Location:  Inpatient Procedure: 2D Echo, 3D Echo, Cardiac Doppler and Color Doppler Indications:    I50.40* Unspecified combined systolic (congestive) and diastolic                 (congestive) heart failure  History:        Patient has no prior history of Echocardiogram examinations. CHF                 and Cardiomegaly, Signs/Symptoms:Dizziness/Lightheadedness; Risk                 Factors:Dyslipidemia and Hypertension.  Sonographer:    Roseanna Rainbow RDCS Referring Phys: [id number] [place]  1. Left ventricular ejection fraction by 3D volume is 65 %. The left ventricle has normal function. The left ventricle has no regional wall motion abnormalities. There is mild concentric left ventricular hypertrophy. Left ventricular diastolic parameters are consistent with Grade I diastolic dysfunction (impaired  relaxation).  2. Right ventricular systolic function is normal. The right ventricular size is normal. Tricuspid regurgitation signal is inadequate for assessing PA pressure.  3. The mitral valve is normal in structure. Mild to moderate mitral valve regurgitation. No evidence of mitral stenosis.  4. The aortic valve is normal in structure. Aortic valve regurgitation is trivial. No aortic stenosis is present.  5. The inferior vena cava is normal in size with greater than 50% respiratory variability, suggesting right atrial pressure of 3 mmHg. FINDINGS  Left Ventricle: Left ventricular ejection fraction by 3D volume is 65 %. The left ventricle has normal function. The left ventricle has no regional wall motion abnormalities. The left ventricular internal cavity size was normal in size. There is mild concentric left ventricular hypertrophy. Left ventricular diastolic parameters are consistent with Grade I diastolic dysfunction (impaired relaxation). Right Ventricle: The right ventricular size is normal. No increase in right ventricular wall thickness. Right ventricular systolic  function is normal. Tricuspid regurgitation signal is inadequate for assessing PA pressure. Left Atrium: Left atrial size was normal in size. Right Atrium: Right atrial size was normal in size. Pericardium: There is no evidence of pericardial effusion. Mitral Valve: The mitral valve is normal in structure. Mild to moderate mitral valve regurgitation. No evidence of mitral valve stenosis. Tricuspid Valve: The tricuspid valve is normal in structure. Tricuspid valve regurgitation is trivial. No evidence of tricuspid stenosis. Aortic Valve: The aortic valve is normal in structure. Aortic valve regurgitation is trivial. No aortic stenosis is present. Pulmonic Valve: The pulmonic valve was normal in structure. Pulmonic valve regurgitation is not visualized. No evidence of pulmonic stenosis. Aorta: The aortic root is normal in size and structure.  Venous: The inferior vena cava is normal in size with greater than 50% respiratory variability, suggesting right atrial pressure of 3 mmHg. IAS/Shunts: No atrial level shunt detected by color flow Doppler.  LEFT VENTRICLE PLAX 2D LVIDd:         3.50 cm         Diastology LVIDs:         2.20 cm         LV e' medial:    5.98 cm/s LV PW:         1.00 cm         LV E/e' medial:  10.5 LV IVS:        1.10 cm         LV e' lateral:   6.42 cm/s LVOT diam:     2.10 cm         LV E/e' lateral: 9.8 LV SV:         78 LV SV Index:   47 LVOT Area:     3.46 cm        3D Volume EF                                LV 3D EF:    Left                                             ventricul LV Volumes (MOD)                            ar LV vol d, MOD    64.4 ml                    ejection A2C:                                        fraction LV vol d, MOD    49.4 ml                    by 3D A4C:                                        volume is LV vol s, MOD    19.8 ml  65 %. A2C: LV vol s, MOD    17.0 ml A4C:                           3D Volume EF: LV SV MOD A2C:   44.6 ml       3D EF:        65 % LV SV MOD A4C:   49.4 ml       LV EDV:       96 ml LV SV MOD BP:    38.1 ml       LV ESV:       33 ml                                LV SV:        63 ml RIGHT VENTRICLE RV S prime:     11.60 cm/s TAPSE (M-mode): 1.3 cm LEFT ATRIUM             Index        RIGHT ATRIUM          Index LA diam:        2.30 cm 1.40 cm/m   RA Area:     6.60 cm LA Vol (A2C):   33.4 ml 20.35 ml/m  RA Volume:   9.78 ml  5.96 ml/m LA Vol (A4C):   26.3 ml 16.02 ml/m LA Biplane Vol: 29.4 ml 17.91 ml/m  AORTIC VALVE             PULMONIC VALVE LVOT Vmax:   129.00 cm/s PR End Diast Vel: 1.57 msec LVOT Vmean:  81.500 cm/s LVOT VTI:    0.225 m  AORTA Ao Root diam: 3.20 cm Ao Asc diam:  3.30 cm MITRAL VALVE MV Area (PHT): 3.22 cm    SHUNTS MV Decel Time: 236 msec    Systemic VTI:  0.22 m MR PISA:        0.25 cm   Systemic Diam: 2.10 cm MR PISA Radius:  0.20 cm MV E velocity: 62.90 cm/s MV A velocity: 78.20 cm/s MV E/A ratio:  0.80 Kardie Tobb DO Electronically signed by Berniece Salines DO Signature Date/Time: 01/09/2022/5:48:42 PM    Final        Assessment / Plan:   Assessment: 1.  Dysphagia: EGD 2011 with gastritis, esophagitis and esophageal stricture post dilation, upper GI series 12/27/2021 without any evidence of stricture, barium tablet not able to be done, MBS with pharyngeal dysphagia, CT chest/abdomen without acute abnormality, CT head without any acute abnormality 2.  GERD: No obvious reflux with patient does have history 3.  Failure to thrive in adult: Multifactorial with recent fracture, COVID, vertigo, dysphagia 4.  Acute CHF 5.  Sacral decubitus ulcer, stage II  Plan: 1.  Briefly reviewed recent echo with the patient and her husband today.  It is somewhat reassuring with normal ejection fraction.  I do not think her heart failure would prohibit her from being under anesthesia if that is what she would like to do. 2.  Discussed possible EGD with the patient and her husband.  Explained that this would not be done for a curative reason, but if there is something that is adding to her problems and may be able to be fixed such as stricture, yeast or something else contributing to her symptoms.  The idea would be to lessen the degree  of her symptoms but we would not be able to fix them completely.  I have gone ahead and schedule patient for an EGD tomorrow afternoon with Dr. Rush Landmark.  He can discuss further with them and if plans change it can be canceled.  An order is also been placed for n.p.o. at midnight. 3.  Patient and her husband would like their daughter [name] called who works in surgery here in the [place]. [phone] once we decide on plans going forward 4. Added MIralax qd for constipation  Thanks for your kind consultation, we will continue to follow.    LOS: 2 days   Levin Erp  01/10/2022, 9:32 AM

## 2022-01-10 NOTE — TOC Progression Note (Signed)
Transition of [facility]) - Progression Note    Patient Details  Name: Tasha Moss MRN: [id number] Date of Birth: [birth year]  Transition of Care [facility]) CM/SW Contact  Leeroy Cha, RN Phone Number: 01/10/2022, 10:47 AM  Clinical Narrative:    Passar number [id number] A Fl2 sent out to area snfs [place] and [employer]   Expected Discharge Plan: [place] Barriers to Discharge: Continued Medical Work up  Expected Discharge Plan and Services Expected Discharge Plan: [place]   Discharge Planning Services: CM Consult   Living arrangements for the past 2 months: Single Family Home                                       Social Determinants of Health (SDOH) Interventions    Readmission Risk Interventions No flowsheet data found.

## 2022-01-10 NOTE — NC FL2 (Signed)
[place] LEVEL OF CARE SCREENING TOOL     IDENTIFICATION  Patient Name: Tasha Moss Birthdate: 27-Sep-1936 Sex: female Admission Date (Current Location): 01/07/2022  [place] and [place] Number:  Herbalist and Address:  [facility],  [place], [place]      Provider Number: [id number]  Attending Physician Name and Address:  Oswald Hillock, MD  Relative Name and Phone Number:       Current Level of Care: Hospital Recommended Level of Care: [place] Prior Approval Number:    Date Approved/Denied:   PASRR Number: [id number]  Discharge Plan: SNF    Current Diagnoses: Patient Active Problem List   Diagnosis Date Noted   Malnutrition of moderate degree 01/09/2022   Acute CHF (congestive heart failure) ([place]) 01/08/2022   Sacral decubitus ulcer, stage II ([place]) 01/08/2022   Failure to thrive in adult 01/07/2022   Non-seasonal allergic rhinitis 11/29/2021   SARS-associated coronavirus exposure 11/24/2021   Unspecified protein-calorie malnutrition ([place]) 11/24/2021   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 ([place]) 11/06/2021   Elevated BP without diagnosis of hypertension 10/31/2020   Dizziness 10/31/2020   Status post reverse arthroplasty of left shoulder 03/11/2016   Depression with anxiety 03/11/2016   Routine general medical examination at a health care facility 06/29/2014   H/O thyroid nodule 06/21/2013   DIVERTICULOSIS, COLON 07/18/2010   GERD without esophagitis 04/09/2010   Stricture and stenosis of esophagus 02/21/2010   Dysphagia 02/09/2010   Osteoarthritis 11/22/2009   Benign paroxysmal positional vertigo 05/15/2009   HTN (hypertension) 09/28/2008   SPINAL STENOSIS, LUMBAR 09/28/2008   PERNICIOUS ANEMIA 03/28/2008   Hypothyroidism 09/08/2007   HYPERLIPIDEMIA 09/08/2007   RESTLESS LEG SYNDROME 09/08/2007   HASHIMOTO'S THYROIDITIS 02/23/2007    Orientation RESPIRATION  BLADDER Height & Weight     Self, Time, Situation, Place  Normal Continent Weight: 58.5 kg Height:  5\' 5"  (165.1 cm)  BEHAVIORAL SYMPTOMS/MOOD NEUROLOGICAL BOWEL NUTRITION STATUS      Continent Diet (regular)  AMBULATORY STATUS COMMUNICATION OF NEEDS Skin   Extensive Assist Verbally Normal                       Personal Care Assistance Level of Assistance  Bathing, Feeding, Dressing Bathing Assistance: Limited assistance Feeding assistance: Limited assistance Dressing Assistance: Limited assistance     Functional Limitations Info  Sight, Hearing, Speech Sight Info: Adequate Hearing Info: Adequate Speech Info: Adequate    SPECIAL CARE FACTORS FREQUENCY  PT (By licensed PT), OT (By licensed OT)     PT Frequency: 5 x weekly OT Frequency: 5 x weekly            Contractures      Additional Factors Info  Code Status Code Status Info: full             Current Medications (01/10/2022):  This is the current hospital active medication list Current Facility-Administered Medications  Medication Dose Route Frequency Provider Last Rate Last Admin   acetaminophen (TYLENOL) tablet 500 mg  500 mg Oral Q6H PRN Tu, Ching T, DO       cephALEXin (KEFLEX) capsule 500 mg  500 mg Oral Q12H Darrick Meigs, Gagan S, MD   500 mg at 01/10/22 1039   cholecalciferol (VITAMIN D) tablet 1,000 Units  1,000 Units Oral Daily Tu, Ching T, DO   1,000 Units at 01/10/22 1040   dimenhyDRINATE (DRAMAMINE) tablet 50 mg  50 mg Oral Q6H PRN Tu, Ching T, DO       enoxaparin (LOVENOX) injection 40 mg  40 mg Subcutaneous Q24H Tu, Ching T, DO   40 mg at 01/09/22 2100   feeding supplement (ENSURE ENLIVE / ENSURE PLUS) liquid 237 mL  237 mL Oral TID BM Oswald Hillock, MD   237 mL at 01/10/22 1041   FLUoxetine (PROZAC) capsule 20 mg  20 mg Oral Daily Tu, Ching T, DO   20 mg at 01/10/22 1040   furosemide (LASIX) injection 40 mg  40 mg Intravenous Once Tu, Ching T, DO       levothyroxine (SYNTHROID) tablet 125 mcg  125  mcg Oral QAC breakfast Tu, Ching T, DO   125 mcg at 01/10/22 0700   loratadine (CLARITIN) tablet 10 mg  10 mg Oral Daily Tu, Ching T, DO   10 mg at 01/10/22 1040   multivitamin with minerals tablet 1 tablet  1 tablet Oral Daily Oswald Hillock, MD   1 tablet at 01/10/22 1042   nystatin (MYCOSTATIN) 100000 UNIT/ML suspension 500,000 Units  5 mL Mouth/Throat QID Oswald Hillock, MD   500,000 Units at 01/09/22 2100   ondansetron [facility]) injection 4 mg  4 mg Intravenous Once Elodia [place]., MD       ondansetron [facility]) tablet 8 mg  8 mg Oral Q8H PRN Tu, Ching T, DO   8 mg at 01/09/22 1320   pantoprazole (PROTONIX) EC tablet 40 mg  40 mg Oral Daily Tu, Ching T, DO   40 mg at 01/10/22 1040   polyvinyl alcohol (LIQUIFILM TEARS) 1.4 % ophthalmic solution 2 drop  2 drop Both Eyes Daily Tu, Ching T, DO   2 drop at 01/10/22 1042   scopolamine (TRANSDERM-SCOP) 1 MG/3DAYS 1.5 mg  1 patch Transdermal Q72H Tu, Ching T, DO   1.5 mg at 01/07/22 2249   vitamin B-12 (CYANOCOBALAMIN) tablet 250 mcg  250 mcg Oral Daily Tu, Ching T, DO   250 mcg at 01/10/22 1037     Discharge Medications: Please see discharge summary for a list of discharge medications.  Relevant Imaging Results:  Relevant Lab Results:   Additional Information SSN [id number];  Leeroy Cha, RN

## 2022-01-11 ENCOUNTER — Encounter (HOSPITAL_COMMUNITY): Payer: Self-pay | Admitting: Family Medicine

## 2022-01-11 ENCOUNTER — Encounter (HOSPITAL_COMMUNITY)
Admission: EM | Disposition: A | Discharge status: Home Health | Payer: Self-pay | Source: Home / Self Care | Attending: Family Medicine

## 2022-01-11 ENCOUNTER — Inpatient Hospital Stay (HOSPITAL_COMMUNITY): Payer: Medicare Other | Admitting: Anesthesiology

## 2022-01-11 DIAGNOSIS — R627 Adult failure to thrive: Secondary | ICD-10-CM | POA: Diagnosis not present

## 2022-01-11 DIAGNOSIS — H8113 Benign paroxysmal vertigo, bilateral: Secondary | ICD-10-CM | POA: Diagnosis not present

## 2022-01-11 DIAGNOSIS — R1312 Dysphagia, oropharyngeal phase: Secondary | ICD-10-CM | POA: Diagnosis not present

## 2022-01-11 DIAGNOSIS — E039 Hypothyroidism, unspecified: Secondary | ICD-10-CM | POA: Diagnosis not present

## 2022-01-11 HISTORY — PX: ESOPHAGOGASTRODUODENOSCOPY (EGD) WITH PROPOFOL: SHX5813

## 2022-01-11 HISTORY — PX: BIOPSY: SHX5522

## 2022-01-11 HISTORY — PX: SAVORY DILATION: SHX5439

## 2022-01-11 LAB — COMPREHENSIVE METABOLIC PANEL
ALT: 15 U/L (ref 0–44)
AST: 17 U/L (ref 15–41)
Albumin: 2.8 g/dL — ABNORMAL LOW (ref 3.5–5.0)
Alkaline Phosphatase: 60 U/L (ref 38–126)
Anion gap: 7 (ref 5–15)
BUN: 10 mg/dL (ref 8–23)
CO2: 32 mmol/L (ref 22–32)
Calcium: 8.6 mg/dL — ABNORMAL LOW (ref 8.9–10.3)
Chloride: 92 mmol/L — ABNORMAL LOW (ref 98–111)
Creatinine, Ser: 0.45 mg/dL (ref 0.44–1.00)
GFR, Estimated: >60 mL/min (ref >60)
Glucose, Bld: 110 mg/dL — ABNORMAL HIGH (ref 70–99)
Potassium: 3.6 mmol/L (ref 3.5–5.1)
Sodium: 131 mmol/L — ABNORMAL LOW (ref 135–145)
Total Bilirubin: 0.6 mg/dL (ref 0.3–1.2)
Total Protein: 5.4 g/dL — ABNORMAL LOW (ref 6.5–8.1)

## 2022-01-11 LAB — OSMOLALITY, URINE: Osmolality, Ur: 474 mOsm/kg (ref 300–900)

## 2022-01-11 LAB — TSH: TSH: 5.758 u[IU]/mL — ABNORMAL HIGH (ref 0.350–4.500)

## 2022-01-11 LAB — CBC
HCT: 37.4 % (ref 36.0–46.0)
Hemoglobin: 12.4 g/dL (ref 12.0–15.0)
MCH: 30.9 pg (ref 26.0–34.0)
MCHC: 33.2 g/dL (ref 30.0–36.0)
MCV: 93.3 fL (ref 80.0–100.0)
Platelets: 248 10*3/uL (ref 150–400)
RBC: 4.01 MIL/uL (ref 3.87–5.11)
RDW: 14.7 % (ref 11.5–15.5)
WBC: 5.8 10*3/uL (ref 4.0–10.5)
nRBC: 0 % (ref 0.0–0.2)

## 2022-01-11 LAB — OSMOLALITY: Osmolality: 275 mOsm/kg (ref 275–295)

## 2022-01-11 SURGERY — ESOPHAGOGASTRODUODENOSCOPY (EGD) WITH PROPOFOL
Anesthesia: Monitor Anesthesia Care

## 2022-01-11 MED ORDER — PROPOFOL 500 MG/50ML IV EMUL
INTRAVENOUS | Status: DC | PRN
  Administered 2022-01-11: 100 ug/kg/min via INTRAVENOUS

## 2022-01-11 MED ORDER — DIPHENHYDRAMINE HCL 12.5 MG/5ML PO ELIX
50.0000 mg | ORAL_SOLUTION | Freq: Four times a day (QID) | ORAL | Status: DC | PRN
  Administered 2022-01-11: 50 mg via ORAL
  Filled 2022-01-11: qty 20

## 2022-01-11 MED ORDER — DIPHENHYDRAMINE HCL 25 MG PO CAPS: 50.0000 mg | ORAL_CAPSULE | Freq: Four times a day (QID) | ORAL | Status: DC | PRN

## 2022-01-11 MED ORDER — LACTATED RINGERS IV SOLN
INTRAVENOUS | Status: DC
  Administered 2022-01-11: 1000 mL via INTRAVENOUS

## 2022-01-11 MED ORDER — PROPOFOL 10 MG/ML IV BOLUS
INTRAVENOUS | Status: DC | PRN
Start: 2022-01-11 — End: 2022-01-11
  Administered 2022-01-11: 20 mg via INTRAVENOUS
  Administered 2022-01-11 (×3): 30 mg via INTRAVENOUS

## 2022-01-11 MED ORDER — PROPOFOL 500 MG/50ML IV EMUL
INTRAVENOUS | Status: AC
  Filled 2022-01-11: qty 50

## 2022-01-11 SURGICAL SUPPLY — 15 items

## 2022-01-11 NOTE — Anesthesia Procedure Notes (Signed)
Procedure Name: MAC Date/Time: 01/11/2022 12:30 PM Performed by: Lollie Sails, CRNA Pre-anesthesia Checklist: Patient identified, Emergency Drugs available, Suction available, Patient being monitored and Timeout performed Oxygen Delivery Method: Nasal cannula Placement Confirmation: positive ETCO2

## 2022-01-11 NOTE — Interval H&P Note (Signed)
History and Physical Interval Note:  01/11/2022 12:19 PM  Tasha Moss  has presented today for surgery, with the diagnosis of Dysphagia.  The various methods of treatment have been discussed with the patient and family. After consideration of risks, benefits and other options for treatment, the patient has consented to  Procedure(s): ESOPHAGOGASTRODUODENOSCOPY (EGD) WITH PROPOFOL (N/A) as a surgical intervention.  The patient's history has been reviewed, patient examined, no change in status, stable for surgery.  I have reviewed the patient's chart and labs.  Questions were answered to the patient's satisfaction.     [employer]

## 2022-01-11 NOTE — Anesthesia Preprocedure Evaluation (Addendum)
Anesthesia Evaluation  Patient identified by MRN, date of birth, ID band Patient awake    Reviewed: Allergy & Precautions, NPO status , Patient's Chart, lab work & pertinent test results  History of Anesthesia Complications (+) PONV, Family history of anesthesia reaction and history of anesthetic complications  Airway Mallampati: II  TM Distance: >3 FB Neck ROM: Full    Dental  (+) Dental Advisory Given, Teeth Intact   Pulmonary    Pulmonary exam normal breath sounds clear to auscultation       Cardiovascular hypertension, Pt. on medications Normal cardiovascular exam+ Valvular Problems/Murmurs MR  Rhythm:Regular Rate:Normal  Echo 01/09/2022 1. Left ventricular ejection fraction by 3D volume is 65 %. The left ventricle has normal function. The left ventricle has no regional wall motion abnormalities. There is mild concentric left ventricular hypertrophy. Left ventricular diastolic parameters are consistent with Grade I diastolic dysfunction (impaired relaxation).  2. Right ventricular systolic function is normal. The right ventricular size is normal. Tricuspid regurgitation signal is inadequate for assessing PA pressure.  3. The mitral valve is normal in structure. Mild to moderate mitral valve regurgitation. No evidence of mitral stenosis.  4. The aortic valve is normal in structure. Aortic valve regurgitation is trivial. No aortic stenosis is present.  5. The inferior vena cava is normal in size with greater than 50% respiratory variability, suggesting right atrial pressure of 3 mmHg.    Neuro/Psych PSYCHIATRIC DISORDERS Anxiety    GI/Hepatic GERD  Medicated,  Endo/Other  Hypothyroidism   Renal/GU      Musculoskeletal  (+) Arthritis ,   Abdominal   Peds  Hematology  (+) Blood dyscrasia, anemia ,   Anesthesia Other Findings   Reproductive/Obstetrics                            Anesthesia  Physical  Anesthesia Plan  ASA: 3  Anesthesia Plan: MAC   Post-op Pain Management: GA combined w/ Regional for post-op pain and Minimal or no pain anticipated   Induction: Intravenous  PONV Risk Score and Plan: 3 and Propofol infusion, TIVA and Treatment may vary due to age or medical condition  Airway Management Planned: Natural Airway  Additional Equipment:   Intra-op Plan:   Post-operative Plan:   Informed Consent: I have reviewed the patients History and Physical, chart, labs and discussed the procedure including the risks, benefits and alternatives for the proposed anesthesia with the patient or authorized representative who has indicated his/her understanding and acceptance.     Dental advisory given  Plan Discussed with: CRNA  Anesthesia Plan Comments:        Anesthesia Quick Evaluation

## 2022-01-11 NOTE — Progress Notes (Addendum)
[facility]  PROGRESS NOTE  Tasha Moss YIF:[id number] DOB: 1936-05-10 DOA: 01/07/2022 PCP: [name], MD   Brief HPI:   86 year old female with history of BPPV, hypertension, hyperlipidemia, hypothyroidism s/p partial thyroidectomy, GERD, esophageal stricture presented with generalized weakness.  She was admitted in November with left hip dislocation s/p reduction and discharged to skilled nursing facility.  She got COVID in rehab and subsequently developed vertigo with nausea and vomiting.  She also had progressive dysphagia with issue of passage of solid food.  She underwent barium swallow per GI on 12/27/2021.  She aspirated into trachea and plan was for MBS.  She continued to have dizziness and vertigo with nausea vomiting and also developed progressive weakness.  Patient was admitted  with failure to thrive.    Subjective   Patient seen and examined, denies any complaints.  Plan for EGD today.   Assessment/Plan:     Failure to thrive -Secondary to oropharyngeal dysphagia -Patient contemplative about PEG tube placement, awaiting echo results to make final decision -Plan for EGD today  Dysphagia -Followed by [facility] as outpatient -Underwent upper GI series as outpatient on 12/27/2021 with aspiration of contrast into trachea at level of mainstem bronchi -Speech therapy was consulted during this hospital admission, MBS showed pharyngeal esophageal and pharyngeal phase dysphagia-chronic dysphagia thought due to position of cervical spine with exacerbation related to her current illness/deconditioning -Speech therapy recommended full liquid, nectar thick diet -GI consulted, EGD today -Continue PPI  Benign paroxysmal positional vertigo -Denies dizziness at this time -Continue scopolamine patch, as needed Dramamine -Head CT on 01/07/2022 showed no acute abnormality. -PT/OT, vestibular  rehab  Chronic diastolic CHF -Does not appear to be volume overloaded -She  received Lasix x1 on 01/08/2022 -Chest x-ray obtained today shows no worsening of CHF -Echocardiogram obtained today shows EF 65% with grade 1 diastolic dysfunction, also shows mild to moderate mitral regurgitation  Bilateral buttock cellulitis -Significantly improved after starting  Keflex 500 mg p.o. twice daily  Oral thrush -Cleared after starting  nystatin 5 ml 4 times daily  Hypothyroidism -Continue Synthroid 125 mcg daily -TSH 6.5, mildly elevated likely from sick euthyroid syndrome -Repeat TSH in 4 to 6 weeks as outpatient  Sacral lulcer -Change mattress to air overlay mattress  Hyponatremia -Mild, will check serum and urine osmolality -Follow serum sodium in a.m.     Medications     cephALEXin  500 mg Oral Q12H   cholecalciferol  1,000 Units Oral Daily   diclofenac Sodium  2 g Topical QID   enoxaparin (LOVENOX) injection  40 mg Subcutaneous Q24H   feeding supplement  237 mL Oral TID BM   FLUoxetine  20 mg Oral Daily   furosemide  40 mg Intravenous Once   levothyroxine  125 mcg Oral QAC breakfast   loratadine  10 mg Oral Daily   multivitamin with minerals  1 tablet Oral Daily   nystatin  5 mL Mouth/Throat QID   ondansetron (ZOFRAN) IV  4 mg Intravenous Once   pantoprazole  40 mg Oral Daily   polyethylene glycol  17 g Oral Daily   polyvinyl alcohol  2 drop Both Eyes Daily   scopolamine  1 patch Transdermal Q72H   vitamin B-12  250 mcg Oral Daily     Data Reviewed:   CBG:  No results for input(s): GLUCAP in the last 168 hours.  SpO2: 94 % O2 Flow Rate (L/min): 2 L/min    Vitals:   01/11/22 1259 01/11/22  1310 01/11/22 1320 01/11/22 1330  BP: (!) 114/56 (!) 141/59 (!) 162/78 (!) 144/65  Pulse: 95 92 (!) 109 87  Resp: (!) 21 20 20 16   Temp: (!) 97.2 F (36.2 C)     TempSrc: Temporal     SpO2: 99% 96% 99% 94%  Weight:      Height:          Data Reviewed:  Basic Metabolic Panel: Recent Labs  Lab 01/07/22 1534 01/08/22 0335 01/08/22 0805  01/09/22 0344 01/11/22 0348  NA 132* 133* 133* 132* 131*  K 4.4 3.5 3.6 3.6 3.6  CL 95* 96* 94* 93* 92*  CO2 30 30 31  32 32  GLUCOSE 123* 115* 133* 114* 110*  BUN 10 8 9 13 10   CREATININE 0.55 0.42* 0.48 0.59 0.45  CALCIUM 8.7* 8.3* 8.5* 8.8* 8.6*  MG  --   --   --  1.7  --   PHOS  --   --   --  4.6  --     CBC: Recent Labs  Lab 01/07/22 1534 01/08/22 0805 01/09/22 0344 01/11/22 0348  WBC 6.4 9.6 7.3 5.8  NEUTROABS 4.6  --  5.2  --   HGB 13.7 13.2 12.1 12.4  HCT 40.7 38.6 36.2 37.4  MCV 92.3 92.1 93.1 93.3  PLT 278 252 238 248       Antibiotics: Anti-infectives (From admission, onward)    Start     Dose/Rate Route Frequency Ordered Stop   01/09/22 1630  cephALEXin (KEFLEX) capsule 500 mg        500 mg Oral Every 12 hours 01/09/22 1531          DVT prophylaxis: Lovenox  Code Status: Full code  Family Communication: Discussed with patient's daughter at bedside      Objective    Physical Examination:   General-appears in no acute distress Heart-S1-S2, regular, no murmur auscultated Lungs-clear to auscultation bilaterally, no wheezing or crackles auscultated Abdomen-soft, nontender, no organomegaly Extremities-no edema in the lower extremities Neuro-alert, oriented x3, no focal deficit noted Skin-very mild dusky erythema noted on both buttocks  Status is: Inpatient due to failure to thrive, progressive dysphagia    Pressure Injury 01/07/22 Perineum Right (Active)  01/07/22 2130  Location: Perineum  Location Orientation: Right  Staging:   Wound Description (Comments):   Present on Admission: Yes          [place]   [facility] If 7PM-7AM, please contact night-coverage at [email or web address], Office  [phone]   01/11/2022, 1:44 PM  LOS: 3 days

## 2022-01-11 NOTE — Op Note (Addendum)
[facility] Patient Name: Tasha Moss Procedure Date: 01/11/2022 MRN: [id number] Attending MD: Justice Britain , MD Date of Birth: 01/27/1936 CSN: [id number] Age: 86 Admit Type: Inpatient Procedure:                Upper GI endoscopy Indications:              Oropharyngeal phase dysphagia, Dysphagia, Failure                            to thrive, Weight loss Providers:                Justice Britain, MD, Burtis Junes, RN, Cherylynn Ridges, Technician, Tyna Jaksch Technician,                            Edman Circle. [name] CRNA, CRNA Referring MD:             Gerrit Heck, MD, Noralyn Pick,                            Aundra Millet Birdie Riddle, MD, [facility] Medicines:                Monitored Anesthesia Care Complications:            No immediate complications. Estimated Blood Loss:     Estimated blood loss was minimal. Procedure:                Pre-Anesthesia Assessment:                           - Prior to the procedure, a History and Physical                            was performed, and patient medications and                            allergies were reviewed. The patient's tolerance of                            previous anesthesia was also reviewed. The risks                            and benefits of the procedure and the sedation                            options and risks were discussed with the patient.                            All questions were answered, and informed consent                            was obtained. Prior Anticoagulants: The patient has  taken no previous anticoagulant or antiplatelet                            agents except for aspirin. ASA Grade Assessment:                            III - A patient with severe systemic disease. After                            reviewing the risks and benefits, the patient was                            deemed in satisfactory condition to  undergo the                            procedure.                           After obtaining informed consent, the endoscope was                            passed under direct vision. Throughout the                            procedure, the patient's blood pressure, pulse, and                            oxygen saturations were monitored continuously. The                            [id number]-H190 ([id number]) Olympus endoscope was introduced                            through the mouth, and advanced to the second part                            of duodenum. The upper GI endoscopy was                            accomplished without difficulty. The patient                            tolerated the procedure. Scope In: Scope Out: Findings:      No gross lesions were noted in the entire esophagus. Biopsies were taken       with a cold forceps for histology to rule out EoE/LoE. After the rest of       the procedure was complete a guidewire was placed and the scope was       withdrawn. Dilation was performed with a Savary dilator with no       resistance at 16 mm and mild resistance at 18 mm. The dilation site was       examined following endoscope reinsertion and showed mild mucosal       disruption/wrent at the UES and mild improvement in luminal narrowing.  The Z-line was regular and was found 39 cm from the incisors.      A 2 cm hiatal hernia was present.      Patchy mildly erythematous mucosa was found in the entire examined       stomach. Biopsies were taken with a cold forceps for histology and       Helicobacter pylori testing.      No gross lesions were noted in the duodenal bulb, in the first portion       of the duodenum and in the second portion of the duodenum. Biopsies were       taken with a cold forceps for histology to rule out enteropathies. Impression:               - No gross lesions in esophagus. Biopsied. Dilated.                            Mild mucosal wrent at the UES noted.                            - Z-line regular, 39 cm from the incisors.                           - 2 cm hiatal hernia. Erythematous mucosa in the                            stomach - biopsied.                           - No gross lesions in the duodenal bulb, in the                            first portion of the duodenum and in the second                            portion of the duodenum. Biopsied. Moderate Sedation:      Not Applicable - Patient had care per Anesthesia. Recommendation:           - The patient will be observed post-procedure,                            until all discharge criteria are met.                           - Return patient to hospital ward for ongoing care.                           - Please use Cepacol or Halls Lozenges +/-                            Chloraseptic spray for next 72-96 hours to aid in                            sore thoat should you experience this.                           -  Resume previous diet.                           - Continue present medications.                           - Await pathology results.                           - Repeat upper endoscopy PRN for retreatment if                            dysphagia symptoms are significantly improved,                            though suspect this will not be the case.                           - I see no contraindication for consideration of                            PEG tube placement for optimal nutrition for this                            patient if need be. I spoke with the patient's                            duaghter at length today and they are leaning                            towards this, but the patient has to make the                            ultimate decision for/against this.                           - The findings and recommendations were discussed                            with the patient.                           - The findings and recommendations were discussed                             with the patient's family.                           - The findings and recommendations were discussed                            with the referring physician. Procedure Code(s):        --- Professional ---  43248, Esophagogastroduodenoscopy, flexible,                            transoral; with insertion of guide wire followed by                            passage of dilator(s) through esophagus over guide                            wire Diagnosis Code(s):        --- Professional ---                           K44.9, Diaphragmatic hernia without obstruction or                            gangrene                           K31.89, Other diseases of stomach and duodenum                           R13.12, Dysphagia, oropharyngeal phase                           R62.7, Adult failure to thrive                           R63.4, Abnormal weight loss CPT copyright 2019 American Medical Association. All rights reserved. The codes documented in this report are preliminary and upon coder review may  be revised to meet current compliance requirements. Justice Britain, MD 01/11/2022 1:07:16 PM Number of Addenda: 0

## 2022-01-11 NOTE — Progress Notes (Signed)
SLP Cancellation Note  Patient Details Name: Tasha Moss MRN: [id number] DOB: 1936/06/05   Cancelled treatment:       Reason Eval/Treat Not Completed: Other (comment) (pt having EGD today, will follow up)  Kathleen Lime, MS [facility] SLP [place] Office [phone] Cell [phone]  Macario Golds 01/11/2022, 12:47 PM

## 2022-01-11 NOTE — TOC Progression Note (Signed)
Transition of Care [facility]) - Progression Note    Patient Details  Name: ZSAZSA BAHENA MRN: [id number] Date of Birth: 02/01/1936  Transition of [facility]) CM/SW Contact  Leeroy Cha, RN Phone Number: 01/11/2022, 2:12 PM  Clinical Narrative:    No active bed offers all requested pending.   Expected Discharge Plan: [place] Barriers to Discharge: Continued Medical Work up  Expected Discharge Plan and Services Expected Discharge Plan: [place]   Discharge Planning Services: CM Consult   Living arrangements for the past 2 months: Single Family Home                                       Social Determinants of Health (SDOH) Interventions    Readmission Risk Interventions No flowsheet data found.

## 2022-01-11 NOTE — Progress Notes (Addendum)
Tap water enema completed. Pt had small BM. Pt denied constipation. Pt tolerated enema well

## 2022-01-11 NOTE — Anesthesia Postprocedure Evaluation (Signed)
Anesthesia Post Note  Patient: Tasha Moss  Procedure(s) Performed: ESOPHAGOGASTRODUODENOSCOPY (EGD) WITH PROPOFOL BIOPSY SAVORY DILATION     Patient location during evaluation: PACU Anesthesia Type: MAC Level of consciousness: awake and alert Pain management: pain level controlled Vital Signs Assessment: post-procedure vital signs reviewed and stable Respiratory status: spontaneous breathing Cardiovascular status: stable Anesthetic complications: no   No notable events documented.  Last Vitals:  Vitals:   01/11/22 1330 01/11/22 1400  BP: (!) 144/65 (!) 147/78  Pulse: 87 89  Resp: 16 18  Temp:  36.8 C  SpO2: 94% 93%    Last Pain:  Vitals:   01/11/22 1400  TempSrc: Oral  PainSc:                  Nolon Nations

## 2022-01-11 NOTE — Transfer of Care (Signed)
Immediate Anesthesia Transfer of Care Note  Patient: Tasha Moss  Procedure(s) Performed: ESOPHAGOGASTRODUODENOSCOPY (EGD) WITH PROPOFOL BIOPSY SAVORY DILATION  Patient Location: PACU and Endoscopy Unit  Anesthesia Type:MAC  Level of Consciousness: awake, alert  and patient cooperative  Airway & Oxygen Therapy: Patient Spontanous Breathing and Patient connected to nasal cannula oxygen  Post-op Assessment: Report given to RN, Post -op Vital signs reviewed and stable and BP 133/58  Post vital signs: Reviewed and stable  Last Vitals:  Vitals Value Taken Time  BP    Temp    Pulse 95 01/11/22 1259  Resp 21 01/11/22 1259  SpO2 99 % 01/11/22 1259  Vitals shown include unvalidated device data.  Last Pain:  Vitals:   01/11/22 1211  TempSrc: Tympanic  PainSc: 5          Complications: No notable events documented.

## 2022-01-12 DIAGNOSIS — H8113 Benign paroxysmal vertigo, bilateral: Secondary | ICD-10-CM | POA: Diagnosis not present

## 2022-01-12 DIAGNOSIS — R1312 Dysphagia, oropharyngeal phase: Secondary | ICD-10-CM | POA: Diagnosis not present

## 2022-01-12 DIAGNOSIS — E440 Moderate protein-calorie malnutrition: Secondary | ICD-10-CM | POA: Diagnosis not present

## 2022-01-12 DIAGNOSIS — R627 Adult failure to thrive: Secondary | ICD-10-CM | POA: Diagnosis not present

## 2022-01-12 LAB — BASIC METABOLIC PANEL
Anion gap: 7 (ref 5–15)
BUN: 7 mg/dL — ABNORMAL LOW (ref 8–23)
CO2: 32 mmol/L (ref 22–32)
Calcium: 8.3 mg/dL — ABNORMAL LOW (ref 8.9–10.3)
Chloride: 88 mmol/L — ABNORMAL LOW (ref 98–111)
Creatinine, Ser: 0.44 mg/dL (ref 0.44–1.00)
GFR, Estimated: >60 mL/min (ref >60)
Glucose, Bld: 108 mg/dL — ABNORMAL HIGH (ref 70–99)
Potassium: 3.5 mmol/L (ref 3.5–5.1)
Sodium: 127 mmol/L — ABNORMAL LOW (ref 135–145)

## 2022-01-12 LAB — CBC
HCT: 38.9 % (ref 36.0–46.0)
Hemoglobin: 12.6 g/dL (ref 12.0–15.0)
MCH: 30.7 pg (ref 26.0–34.0)
MCHC: 32.4 g/dL (ref 30.0–36.0)
MCV: 94.6 fL (ref 80.0–100.0)
Platelets: 260 10*3/uL (ref 150–400)
RBC: 4.11 MIL/uL (ref 3.87–5.11)
RDW: 14.7 % (ref 11.5–15.5)
WBC: 6.6 10*3/uL (ref 4.0–10.5)
nRBC: 0 % (ref 0.0–0.2)

## 2022-01-12 MED ORDER — MECLIZINE HCL 25 MG PO TABS
12.5000 mg | ORAL_TABLET | Freq: Once | ORAL | Status: DC
  Filled 2022-01-12: qty 1

## 2022-01-12 NOTE — Progress Notes (Signed)
° ° °  Progress Note   Subjective  Patient with aspiration episode when taking medication with applesauce, required suctioning via nursing Denies chest pain, abdominal pain.  No nausea or vomiting Does not feel any different with swallowing after dilation yesterday Planning PEG tube Monday   Objective  Vital signs in last 24 hours: Temp:  [97.2 F (36.2 C)-98.3 F (36.8 C)] 98.3 F (36.8 C) (02/04 0449) Pulse Rate:  [74-109] 74 (02/04 0449) Resp:  [16-21] 16 (02/04 0449) BP: (114-162)/(56-78) 126/69 (02/04 0449) SpO2:  [92 %-99 %] 93 % (02/04 0449) Weight:  [57.4 kg] 57.4 kg (02/04 0500) Last BM Date: 01/11/22 Gen: awake, alert, NAD HEENT: anicteric, op clear CV: RRR, no mrg Pulm: CTA b/l Abd: soft, NT/ND, +BS throughout Ext: no c/c/e Neuro: nonfocal   Intake/Output from previous day: 02/03 0701 - 02/04 0700 In: 443.6 [P.O.:120; I.V.:323.6] Out: 550 [Urine:550] Intake/Output this shift: Total I/O In: 150 [P.O.:150] Out: -   Lab Results: Recent Labs    01/11/22 0348 01/12/22 0425  WBC 5.8 6.6  HGB 12.4 12.6  HCT 37.4 38.9  PLT 248 260   BMET Recent Labs    01/11/22 0348 01/12/22 0425  NA 131* 127*  K 3.6 3.5  CL 92* 88*  CO2 32 32  GLUCOSE 110* 108*  BUN 10 7*  CREATININE 0.45 0.44  CALCIUM 8.6* 8.3*   LFT Recent Labs    01/11/22 0348  PROT 5.4*  ALBUMIN 2.8*  AST 17  ALT 15  ALKPHOS 31  BILITOT 0.6      Assessment & Recommendations  86 year old with GERD, history of esophageal stricture, likely dysmotility, hypothyroidism, hypertension, hyperlipidemia, recent issues after hip dislocation and COVID-19 admitted with failure to thrive and oropharyngeal dysphagia/aspiration.   1.  Oropharyngeal dysphagia/failure to thrive --patient and family in agreement with plan for IR PEG placement on Monday.  We again reviewed the risks and benefits of PEG tube.  We discussed how there is a small risk for bleeding and infection.  We discussed that PEG  tubes do not necessarily prevent aspiration in patients that have reflux.  We also discussed however that the PEG tube can provide 100% of her hydration and nutrition if aspiration with p.o. intake continues.  If aspiration improves then she can continue the nectar thick liquids. GI will sign off for now, call if questions    LOS: 4 days   Jerene Bears  01/12/2022, 12:24 PM See Shea Evans, [facility], to contact our on call provider

## 2022-01-12 NOTE — Progress Notes (Signed)
°   01/12/22 1200  Mobility  Activity Ambulated with assistance in room  Level of Assistance Contact guard assist, steadying assist  Assistive Device Front wheel walker  Distance Ambulated (ft) 35 ft  Activity Response Tolerated fair;Tolerated well  $Mobility charge 1 Mobility   Pt agreeable to mobilize this morning. Ambulated about 66ft in room with RW, tolerated well. No complaints during ambulation. Placed pt back in her chair. Pt became very emotional about her lack of progress, and feels like she will not get better. She states she is frustrated that she has to keep going through procedures and is getting weaker every day. Explained to pt that progress is not always uphill, and gave her some exercises that she can do while sitting in the chair to strengthen her legs. Left pt in chair, call bell at side. RN/NT notified of session.   [facility] Office: [phone]

## 2022-01-12 NOTE — Progress Notes (Signed)
[facility]  PROGRESS NOTE  Tasha Moss UEA:[id number] DOB: 1936-01-12 DOA: 01/07/2022 PCP: [name], MD   Brief HPI:   86 year old female with history of BPPV, hypertension, hyperlipidemia, hypothyroidism s/p partial thyroidectomy, GERD, esophageal stricture presented with generalized weakness.  She was admitted in November with left hip dislocation s/p reduction and discharged to skilled nursing facility.  She got COVID in rehab and subsequently developed vertigo with nausea and vomiting.  She also had progressive dysphagia with issue of passage of solid food.  She underwent barium swallow per GI on 12/27/2021.  She aspirated into trachea and plan was for MBS.  She continued to have dizziness and vertigo with nausea vomiting and also developed progressive weakness.  Patient was admitted  with failure to thrive.    Subjective   Patient seen and examined, s/p EGD with dilation yesterday.  No significant improvement in swallowing.  Complains of dizziness this morning, says that she had episode of vertigo while RN were trying to reposition her in the bed.  Patient was given Benadryl last night.  Also complains of right ear pain.   Assessment/Plan:     Failure to thrive -Secondary to oropharyngeal dysphagia -Patient and family have decided to go with PEG tube placement -IR consulted for gastrostomy tube on Monday   Dysphagia -Followed by [facility] as outpatient -Underwent upper GI series as outpatient on 12/27/2021 with aspiration of contrast into trachea at level of mainstem bronchi -Speech therapy was consulted during this hospital admission, MBS showed pharyngeal esophageal and pharyngeal phase dysphagia-chronic dysphagia thought due to position of cervical spine with exacerbation related to her current illness/deconditioning -Speech therapy recommended full liquid, nectar thick diet -GI consulted, EGD was done yesterday; EGD was unremarkable, also esophagus was  dilated -No significant improvement after EGD -Family decided for PEG tube placement, IR consulted for gastrostomy tube placement on Monday -Continue PPI  Benign paroxysmal positional vertigo -Complains of dizziness this morning, refusing to take meclizine. -Patient is already on scopolamine patch, will avoid adding Dramamine to avoid anticholinergic side effects -Head CT on 01/07/2022 showed no acute abnormality. -PT/OT, vestibular  rehab  Chronic diastolic CHF -Does not appear to be volume overloaded -She received Lasix x1 on 01/08/2022 -Chest x-ray obtained today shows no worsening of CHF -Echocardiogram obtained today shows EF 65% with grade 1 diastolic dysfunction, also shows mild to moderate mitral regurgitation  Bilateral buttock cellulitis -Significantly improved after starting  Keflex 500 mg p.o. twice daily  Oral thrush -Cleared after starting  nystatin 5 ml 4 times daily  Hypothyroidism -Continue Synthroid 125 mcg daily -TSH 6.5, mildly elevated likely from sick euthyroid syndrome -Repeat TSH in 4 to 6 weeks as outpatient  Sacral lulcer -Change mattress to air overlay mattress  Hyponatremia -Likely from poor p.o. intake/dehydration -Serum osmolality 275 -We will avoid giving IV fluids due to underlying history of CHF -Hopefully should improve after starting nutrition with PEG tube placement      Medications     cephALEXin  500 mg Oral Q12H   cholecalciferol  1,000 Units Oral Daily   enoxaparin (LOVENOX) injection  40 mg Subcutaneous Q24H   feeding supplement  237 mL Oral TID BM   FLUoxetine  20 mg Oral Daily   furosemide  40 mg Intravenous Once   levothyroxine  125 mcg Oral QAC breakfast   loratadine  10 mg Oral Daily   multivitamin with minerals  1 tablet Oral Daily   nystatin  5 mL Mouth/Throat QID  ondansetron (ZOFRAN) IV  4 mg Intravenous Once   pantoprazole  40 mg Oral Daily   polyethylene glycol  17 g Oral Daily   polyvinyl alcohol  2 drop Both  Eyes Daily   scopolamine  1 patch Transdermal Q72H   vitamin B-12  250 mcg Oral Daily     Data Reviewed:   CBG:  No results for input(s): GLUCAP in the last 168 hours.  SpO2: 93 % O2 Flow Rate (L/min): 2 L/min    Vitals:   01/11/22 2037 01/11/22 2215 01/12/22 0449 01/12/22 0500  BP: 136/72 133/62 126/69   Pulse: 87 80 74   Resp: 16  16   Temp: 98.2 F (36.8 C)  98.3 F (36.8 C)   TempSrc: Oral  Oral   SpO2: 95% 92% 93%   Weight:    57.4 kg  Height:          Data Reviewed:  Basic Metabolic Panel: Recent Labs  Lab 01/08/22 0335 01/08/22 0805 01/09/22 0344 01/11/22 0348 01/12/22 0425  NA 133* 133* 132* 131* 127*  K 3.5 3.6 3.6 3.6 3.5  CL 96* 94* 93* 92* 88*  CO2 30 31 32 32 32  GLUCOSE 115* 133* 114* 110* 108*  BUN 8 9 13 10  7*  CREATININE 0.42* 0.48 0.59 0.45 0.44  CALCIUM 8.3* 8.5* 8.8* 8.6* 8.3*  MG  --   --  1.7  --   --   PHOS  --   --  4.6  --   --     CBC: Recent Labs  Lab 01/07/22 1534 01/08/22 0805 01/09/22 0344 01/11/22 0348 01/12/22 0425  WBC 6.4 9.6 7.3 5.8 6.6  NEUTROABS 4.6  --  5.2  --   --   HGB 13.7 13.2 12.1 12.4 12.6  HCT 40.7 38.6 36.2 37.4 38.9  MCV 92.3 92.1 93.1 93.3 94.6  PLT 278 252 238 248 260       Antibiotics: Anti-infectives (From admission, onward)    Start     Dose/Rate Route Frequency Ordered Stop   01/09/22 1630  cephALEXin (KEFLEX) capsule 500 mg        500 mg Oral Every 12 hours 01/09/22 1531          DVT prophylaxis: Lovenox  Code Status: Full code  Family Communication: Discussed with patient's daughter at bedside      Objective    Physical Examination:   General-appears in no acute distress HEENT-right ear examined, no erythema noted on tympanic membrane, dried wax in the ear canal Heart-S1-S2, regular, no murmur auscultated Lungs-clear to auscultation bilaterally, no wheezing or crackles auscultated Abdomen-soft, nontender, no organomegaly Extremities-no edema in the lower  extremities Neuro-alert, oriented x3, no focal deficit noted  Status is: Inpatient due to failure to thrive, progressive dysphagia    Pressure Injury 01/07/22 Perineum Right (Active)  01/07/22 2130  Location: Perineum  Location Orientation: Right  Staging:   Wound Description (Comments):   Present on Admission: Yes          [place]   [facility] If 7PM-7AM, please contact night-coverage at [email or web address], Office  [phone]   01/12/2022, 1:57 PM  LOS: 4 days

## 2022-01-12 NOTE — Progress Notes (Signed)
pt had an episode of vertigo (not new)  during bath. immediately after tele monitor tech reported pt had a 9beat run of SVT with HR 137.  heart rhythm has returned to NSR with BP 133/62 MAP 82  On call App Md Norins notified. No new orders.  Will continue to monitor pt and address needs PRN

## 2022-01-13 DIAGNOSIS — E871 Hypo-osmolality and hyponatremia: Secondary | ICD-10-CM

## 2022-01-13 DIAGNOSIS — R1312 Dysphagia, oropharyngeal phase: Secondary | ICD-10-CM | POA: Diagnosis not present

## 2022-01-13 DIAGNOSIS — E440 Moderate protein-calorie malnutrition: Secondary | ICD-10-CM | POA: Diagnosis not present

## 2022-01-13 DIAGNOSIS — Z7189 Other specified counseling: Secondary | ICD-10-CM | POA: Diagnosis not present

## 2022-01-13 DIAGNOSIS — Z515 Encounter for palliative care: Secondary | ICD-10-CM | POA: Diagnosis not present

## 2022-01-13 DIAGNOSIS — R627 Adult failure to thrive: Secondary | ICD-10-CM | POA: Diagnosis not present

## 2022-01-13 DIAGNOSIS — H8113 Benign paroxysmal vertigo, bilateral: Secondary | ICD-10-CM | POA: Diagnosis not present

## 2022-01-13 MED ORDER — CEPHALEXIN 500 MG PO CAPS
500.0000 mg | ORAL_CAPSULE | Freq: Two times a day (BID) | ORAL | Status: AC
  Administered 2022-01-13 – 2022-01-14 (×2): 500 mg via ORAL
  Filled 2022-01-13 (×2): qty 1

## 2022-01-13 MED ORDER — CEFAZOLIN SODIUM-DEXTROSE 2-4 GM/100ML-% IV SOLN: 2.0000 g | INTRAVENOUS | Status: AC

## 2022-01-13 NOTE — Consult Note (Addendum)
Chief Complaint: Patient was seen in consultation today for percutaneous gastrostomy tube placement  Chief Complaint  Patient presents with   Emesis   Failure To Thrive    Referring Physician(s): Lama,G  Supervising Physician: Mir, Sharen Heck  Patient Status: [facility] - In-pt  History of Present Illness: Tasha Moss is an 86 y.o. female with past medical history of anxiety, GERD, esophageal stricture with likely dysmotility , vertigo, ?CHF, diverticulosis, hyperlipidemia, hypertension, prior cervical spine surgery as well as partial thyroidectomy and hypothyroidism who was admitted on 01/07/2022 with failure to thrive and oropharyngeal dysphagia/aspiration. She underwent EGD on 2/3 with dilation of mild wrent at [facility] -no esoph lesions were noted, biopsies were taken of erythematous mucosa in the stomach, no lesions in the duodenal bulb; hiatal hernia present. Dilation did not improve pt's symptoms. She is currently only on thin liquid/nectar thick liquid diet. She is afebrile with nl WBC, nl creat, nl PT/INR. Covid 19 neg.  Past Medical History:  Diagnosis Date   Allergy    seasonal & dust   Anxiety    Arthritis    DJD   Family history of adverse reaction to anesthesia    n/v    GERD (gastroesophageal reflux disease)    History of vertigo    Hyperlipidemia    Hypertension    Hypothyroidism    PONV (postoperative nausea and vomiting)    Thyroid disease    hypothyroidism post Hashimoto's & partial thyroidectomy    Past Surgical History:  Procedure Laterality Date   CATARACT EXTRACTION W/ INTRAOCULAR LENS  IMPLANT, BILATERAL     COLONOSCOPY     diverticulosis   JOINT REPLACEMENT  1995 & 1999   THR bilaterally    lumbar disc repair  1989   Dr Arrie Aran   SPINE SURGERY     cervical ; Dr Vertell Limber   THYROIDECTOMY, PARTIAL     benign nodule   TONSILLECTOMY AND ADENOIDECTOMY     TOTAL SHOULDER ARTHROPLASTY Left 02/27/2016   Procedure: LEFT REVERSE TOTAL SHOULDER  ARTHROPLASTY;  Surgeon: Renette Butters, MD;  Location: [place];  Service: Orthopedics;  Laterality: Left;   vitreous detachment     Dr Claudean Kinds    Allergies: Gabapentin, Other, and Morphine  Medications: Prior to Admission medications   Medication Sig Start Date End Date Taking? Authorizing Provider  acetaminophen (TYLENOL) 500 MG tablet Take 500 mg by mouth every 6 (six) hours as needed for moderate pain.   Yes [provider]  Cholecalciferol (VITAMIN D3) 1000 UNITS CAPS Take 1,000 Units by mouth daily.   Yes [provider]  Cranberry 200 MG CAPS Take 1 capsule by mouth daily.   Yes [provider]  diclofenac Sodium (VOLTAREN) 1 % GEL Apply 2 g topically 4 (four) times daily as needed (Pain). 12/04/21  Yes Gerlene Fee, NP  dimenhyDRINATE (DRAMAMINE) 50 MG tablet Take 50 mg by mouth every 6 (six) hours as needed for dizziness.   Yes [provider]  FLUoxetine (PROZAC) 20 MG capsule Take 1 capsule (20 mg total) by mouth daily. 12/04/21  Yes Gerlene Fee, NP  fluticasone (FLONASE) 50 MCG/ACT nasal spray Place 1 spray into both nostrils daily.   Yes [provider]  levothyroxine (SYNTHROID) 125 MCG tablet Take 1 tablet (125 mcg total) by mouth daily before breakfast. 12/04/21  Yes Gerlene Fee, NP  loratadine (CLARITIN) 10 MG tablet Take 10 mg by mouth daily.   Yes [provider]  meloxicam (  MOBIC) 7.5 MG tablet Take 1 tablet (7.5 mg total) by mouth 2 (two) times daily. 12/04/21  Yes Gerlene Fee, NP  Multiple Vitamin (MULTIVITAMIN PO) Take 400 mg by mouth daily.   Yes [provider]  Nutritional Supplements (ENSURE ENLIVE PO) Take 237 mLs by mouth 2 (two) times daily.   Yes [provider]  ondansetron (ZOFRAN) 8 MG tablet Take 1 tablet (8 mg total) by mouth 3 (three) times daily before meals. Patient taking differently: Take 8 mg by mouth every 8 (eight) hours as needed for vomiting or nausea.  12/04/21  Yes Gerlene Fee, NP  pantoprazole (PROTONIX) 40 MG tablet Take 1 tablet (40 mg total) by mouth daily. 12/04/21  Yes Gerlene Fee, NP  Polyvinyl Alcohol-Povidone (ARTIFICIAL TEARS) 5-6 MG/ML SOLN Place 2 drops into both eyes daily.   Yes [provider]  scopolamine (TRANSDERM-SCOP) 1 MG/3DAYS Place 1 patch (1.5 mg total) onto the skin every 3 (three) days. 12/07/21  Yes [name], MD  vitamin B-12 (CYANOCOBALAMIN) 250 MCG tablet Take 250 mcg by mouth daily.   Yes [provider]  polyethylene glycol powder (GLYCOLAX/MIRALAX) 17 GM/SCOOP powder Take 17 g by mouth 2 (two) times daily as needed. Patient not taking: Reported on 12/20/2021 12/07/21   [name], MD     Family History  Problem Relation Age of Onset   Diabetes Mother    Hepatitis Mother        Hepatitis B,? etiology   Miscarriages / Stillbirths Mother    Stroke Mother 22   Heart disease Father        CHF   Cancer Paternal Aunt        GI cancers   Heart disease Paternal Aunt        CHF   Cancer Paternal Uncle        lung, GI   Heart disease Paternal Uncle        CHF    Social History   Socioeconomic History   Marital status: Married    Spouse name: Not on file   Number of children: 2   Years of education: Not on file   Highest education level: Not on file  Occupational History   Not on file  Tobacco Use   Smoking status: Never   Smokeless tobacco: Never  Vaping Use   Vaping Use: Never used  Substance and Sexual Activity   Alcohol use: No   Drug use: No   Sexual activity: Not on file  Other Topics Concern   Not on file  Social History Narrative   Married for 49 years    Daughter-nurse   Daughter- paralegal       Right handed   Social Determinants of Health   Financial Resource Strain: Low Risk    Difficulty of Paying Living Expenses: Not hard at all  Food Insecurity: No Food Insecurity   Worried About Charity fundraiser in the Last Year: Never  true   [place] in the Last Year: Never true  Transportation Needs: No Transportation Needs   Lack of Transportation (Medical): No   Lack of Transportation (Non-Medical): No  Physical Activity: Inactive   Days of Exercise per Week: 0 days   Minutes of Exercise per Session: 0 min  Stress: No Stress Concern Present   Feeling of Stress : Not at all  Social Connections: Moderately Integrated   Frequency of Communication with Friends and Family: Twice a  week   Frequency of Social Gatherings with Friends and Family: Twice a week   Attends Religious Services: More than 4 times per year   Active Member of [employer] or Organizations: No   Attends Archivist Meetings: Never   Marital Status: Married      Review of Systems denies fever, chest pain, worsening dyspnea, cough, abdominal/back pain, nausea, vomiting or bleeding.  She does have occasional headaches, some dizzy spells/vertigo.She is very HOH.   Vital Signs: BP 127/62 (BP Location: Left Arm)    Pulse 81    Temp 98.1 F (36.7 C) (Oral)    Resp 16    Ht 5\' 5"  (1.651 m)    Wt 126 lb 8.7 oz (57.4 kg)    SpO2 94%    BMI 21.06 kg/m   Physical Exam awake, alert.  Chest clear to auscultation bilaterally.  Heart with regular rate and rhythm.  Abdomen soft, positive bowel sounds, nontender.  No significant lower extremity edema.  Patient does have history of bilateral buttock cellulitis and sacral ulcer.  Imaging: DG Chest 1 View  Result Date: 01/07/2022 CLINICAL DATA:  Weakness EXAM: CHEST  1 VIEW COMPARISON:  11/19/2021 chest radiograph. FINDINGS: Partially visualized left total shoulder arthroplasty. Stable cardiomediastinal silhouette with mild cardiomegaly. No pneumothorax. No pleural effusion. Chronic moderate elevation of the right hemidiaphragm. No pulmonary edema. No acute consolidative airspace disease. Thyroidectomy clips in the medial lower right neck. IMPRESSION: Mild cardiomegaly without pulmonary edema. No active  pulmonary disease. Electronically Signed   By: Ilona Sorrel M.D.   On: 01/07/2022 15:56   CT Head Wo Contrast  Result Date: 01/07/2022 CLINICAL DATA:  A female at age 20 presents with dizziness in decreased appetite. EXAM: CT HEAD WITHOUT CONTRAST TECHNIQUE: Contiguous axial images were obtained from the base of the skull through the vertex without intravenous contrast. RADIATION DOSE REDUCTION: This exam was performed according to the departmental dose-optimization program which includes automated exposure control, adjustment of the mA and/or kV according to patient size and/or use of iterative reconstruction technique. COMPARISON:  October 23, 2020. FINDINGS: Brain: No evidence of acute infarction, hemorrhage, hydrocephalus, extra-axial collection or mass lesion/mass effect. Mild generalized atrophy. Vascular: No hyperdense vessel or unexpected calcification. Skull: Normal. Negative for fracture or focal lesion. Sinuses/Orbits: Visualized paranasal sinuses and orbits without acute process Other: None IMPRESSION: 1. No acute intracranial abnormality. 2. Mild generalized atrophy. Electronically Signed   By: Zetta Bills M.D.   On: 01/07/2022 16:51   CT Chest W Contrast  Result Date: 01/07/2022 CLINICAL DATA:  Nausea and vomiting.  Respiratory illness. EXAM: CT CHEST, ABDOMEN, AND PELVIS WITH CONTRAST TECHNIQUE: Multidetector CT imaging of the chest, abdomen and pelvis was performed following the standard protocol during bolus administration of intravenous contrast. RADIATION DOSE REDUCTION: This exam was performed according to the departmental dose-optimization program which includes automated exposure control, adjustment of the mA and/or kV according to patient size and/or use of iterative reconstruction technique. CONTRAST:  172mL OMNIPAQUE IOHEXOL 300 MG/ML  SOLN COMPARISON:  Chest x-ray earlier the same day FINDINGS: CT CHEST FINDINGS Cardiovascular: Heart is enlarged. No pericardial effusion  identified. Main pulmonary artery is normal caliber. Thoracic aorta is tortuous and normal caliber with moderate atherosclerotic plaques. Mediastinum/Nodes: No bulky axillary, mediastinal or hilar lymphadenopathy identified. Multiple calcified prevascular mediastinal lymph nodes noted. Lungs/Pleura: Diffuse interlobular septal thickening with mild mosaic attenuation of the lungs. Pulmonary vascular prominence. Biapical pleural thickening. Trace bilateral pleural effusions with associated mild subsegmental atelectasis at  the lung bases. No focal consolidation identified. Diffuse mild bronchial wall thickening. No pneumothorax. Musculoskeletal: Degenerative changes of the spine. No suspicious bony lesions identified. CT ABDOMEN PELVIS FINDINGS Hepatobiliary: Liver is normal in size and contour with no suspicious mass identified. Gallbladder appears normal. No biliary ductal dilatation identified. Pancreas: Unremarkable. No pancreatic ductal dilatation or surrounding inflammatory changes. Spleen: Normal in size without focal abnormality. Adrenals/Urinary Tract: Mild nodularity of the left adrenal gland measuring up to 8 mm in thickness. Symmetric perfusion of the kidneys. No hydronephrosis or enhancing renal mass identified bilaterally. No urinary bladder wall mass identified, significantly limited evaluation due to metallic artifacts from the bilateral hips. Stomach/Bowel: No bowel obstruction, free air or pneumatosis. No bowel wall edema or thickening identified. Colonic diverticulosis. No evidence of acute appendicitis. Vascular/Lymphatic: Aortic atherosclerosis. No enlarged abdominal or pelvic lymph nodes. Reproductive: Retroverted uterus. No suspicious adnexal mass appreciated. Other: No ascites. Musculoskeletal: Advanced degenerative changes of the lumbar spine. Bilateral hip arthroplasties. No suspicious bony lesions identified. IMPRESSION: 1. Evidence of CHF. Cardiomegaly, pulmonary vascular prominence, trace  pleural effusions, interstitial edema and possibly early alveolar edema. 2. No acute process identified in the abdomen or pelvis. 3. Colonic diverticulosis. 4. Other chronic findings as described. Electronically Signed   By: Ofilia Neas M.D.   On: 01/07/2022 16:58   CT Abdomen Pelvis W Contrast  Result Date: 01/07/2022 CLINICAL DATA:  Nausea and vomiting.  Respiratory illness. EXAM: CT CHEST, ABDOMEN, AND PELVIS WITH CONTRAST TECHNIQUE: Multidetector CT imaging of the chest, abdomen and pelvis was performed following the standard protocol during bolus administration of intravenous contrast. RADIATION DOSE REDUCTION: This exam was performed according to the departmental dose-optimization program which includes automated exposure control, adjustment of the mA and/or kV according to patient size and/or use of iterative reconstruction technique. CONTRAST:  143mL OMNIPAQUE IOHEXOL 300 MG/ML  SOLN COMPARISON:  Chest x-ray earlier the same day FINDINGS: CT CHEST FINDINGS Cardiovascular: Heart is enlarged. No pericardial effusion identified. Main pulmonary artery is normal caliber. Thoracic aorta is tortuous and normal caliber with moderate atherosclerotic plaques. Mediastinum/Nodes: No bulky axillary, mediastinal or hilar lymphadenopathy identified. Multiple calcified prevascular mediastinal lymph nodes noted. Lungs/Pleura: Diffuse interlobular septal thickening with mild mosaic attenuation of the lungs. Pulmonary vascular prominence. Biapical pleural thickening. Trace bilateral pleural effusions with associated mild subsegmental atelectasis at the lung bases. No focal consolidation identified. Diffuse mild bronchial wall thickening. No pneumothorax. Musculoskeletal: Degenerative changes of the spine. No suspicious bony lesions identified. CT ABDOMEN PELVIS FINDINGS Hepatobiliary: Liver is normal in size and contour with no suspicious mass identified. Gallbladder appears normal. No biliary ductal dilatation  identified. Pancreas: Unremarkable. No pancreatic ductal dilatation or surrounding inflammatory changes. Spleen: Normal in size without focal abnormality. Adrenals/Urinary Tract: Mild nodularity of the left adrenal gland measuring up to 8 mm in thickness. Symmetric perfusion of the kidneys. No hydronephrosis or enhancing renal mass identified bilaterally. No urinary bladder wall mass identified, significantly limited evaluation due to metallic artifacts from the bilateral hips. Stomach/Bowel: No bowel obstruction, free air or pneumatosis. No bowel wall edema or thickening identified. Colonic diverticulosis. No evidence of acute appendicitis. Vascular/Lymphatic: Aortic atherosclerosis. No enlarged abdominal or pelvic lymph nodes. Reproductive: Retroverted uterus. No suspicious adnexal mass appreciated. Other: No ascites. Musculoskeletal: Advanced degenerative changes of the lumbar spine. Bilateral hip arthroplasties. No suspicious bony lesions identified. IMPRESSION: 1. Evidence of CHF. Cardiomegaly, pulmonary vascular prominence, trace pleural effusions, interstitial edema and possibly early alveolar edema. 2. No acute process identified in the abdomen or  pelvis. 3. Colonic diverticulosis. 4. Other chronic findings as described. Electronically Signed   By: Ofilia Neas M.D.   On: 01/07/2022 16:58   DG CHEST PORT 1 VIEW  Result Date: 01/09/2022 CLINICAL DATA:  CHF, abnormal CT EXAM: PORTABLE CHEST 1 VIEW COMPARISON:  Chest radiograph 01/07/2022, CT chest 01/07/2022 FINDINGS: The cardiomediastinal silhouette is stable. Surgical clips are noted in the upper mediastinum. There is unchanged asymmetric elevation of the right hemidiaphragm. There is no new or worsening focal airspace disease. There is no pulmonary edema. The trace pleural effusions seen on CT are not appreciated on the current study. Overall, aeration is unchanged. The bones are stable, including the sclerotic lesion projecting over the right  scapula. IMPRESSION: No significant interval change since 01/07/2022. No new or worsening focal airspace disease. Electronically Signed   By: Valetta Mole M.D.   On: 01/09/2022 08:03   DG UGI W DOUBLE CM (HD BA)  Result Date: 12/27/2021 CLINICAL DATA:  Dysphagia, weight loss, anemia, reflux, nausea, vomiting, history of remote dilatation 10-15 years ago EXAM: UPPER GI SERIES WITH KUB TECHNIQUE: After obtaining a scout radiograph a routine upper GI series was performed using thin and high density barium. FLUOROSCOPY TIME:  Fluoroscopy Time:  2 minute 6 seconds Radiation Exposure Index (if provided by the fluoroscopic device): 58.2 mGy Number of Acquired Spot Images: 3 + multiple fluoroscopic screen captures COMPARISON:  None FINDINGS: Normal bowel gas pattern on scout image. Normal esophageal distension without gross esophageal mass or stricture. Aspiration of contrast into the trachea extending to the mainstem bronchi with associated spontaneous cough reflex. Due to aspiration, procedure was terminated prematurely, and a 12.5 mm diameter barium tablet was not administered. No areas of esophageal wall irregularity are identified. Stomach distends normally without mass or ulceration. No gastric outlet obstruction. Duodenal bulb and sweep normal appearance. Diverticular seen arising from the third portion of the duodenum as well as at the proximal jejunum. IMPRESSION: Aspiration of contrast into trachea at the level of the mainstem bronchi; consider referral to speech therapy for swallowing function study evaluation. Duodenal diverticula incidentally noted. No other significant abnormalities are seen; the 12.5 mm diameter barium tablet was not administered due to aspiration; this could be administered at time of speech swallowing function exam. Electronically Signed   By: Lavonia Dana M.D.   On: 12/27/2021 10:51   DG Swallowing Func-Speech Pathology  Result Date: 01/08/2022 Table formatting from the original  result was not included. Objective Swallowing Evaluation: Type of Study: MBS-Modified Barium Swallow Study  Patient Details Name: PARNIKA TWETEN MRN: [id number] Date of Birth: [birth year] Today's Date: 01/08/2022 Time: SLP Start Time (ACUTE ONLY): 1500 -SLP Stop Time (ACUTE ONLY): 0973 SLP Time Calculation (min) (ACUTE ONLY): 35 min Past Medical History: Past Medical History: Diagnosis Date  Allergy   seasonal & dust  Anxiety   Arthritis   DJD  Family history of adverse reaction to anesthesia   n/v   GERD (gastroesophageal reflux disease)   History of vertigo   Hyperlipidemia   Hypertension   Hypothyroidism   PONV (postoperative nausea and vomiting)   Thyroid disease   hypothyroidism post Hashimoto's & partial thyroidectomy Past Surgical History: Past Surgical History: Procedure Laterality Date  CATARACT EXTRACTION W/ INTRAOCULAR LENS  IMPLANT, BILATERAL    COLONOSCOPY    diverticulosis  JOINT REPLACEMENT  1995 & 1999  THR bilaterally   lumbar disc repair  1989  Dr Arrie Aran  SPINE SURGERY    cervical ; Dr Vertell Limber  THYROIDECTOMY, PARTIAL    benign nodule  TONSILLECTOMY AND ADENOIDECTOMY    TOTAL SHOULDER ARTHROPLASTY Left 02/27/2016  Procedure: LEFT REVERSE TOTAL SHOULDER ARTHROPLASTY;  Surgeon: Renette Butters, MD;  Location: [place];  Service: Orthopedics;  Laterality: Left;  vitreous detachment    Dr Claudean Kinds HPI: per admitting MD note "Patient was hospitalized back in November with a dislocated left total hip arthroplasty status post reduction and was discharged to SNF.  Also contracted COVID in rehab.  She subsequently developed worsening vertigo with nausea and vomiting thought to be due to bilateral vestibular disorder.  She also has progressive worsening dysphagia and issues with passage of solid food. She was referred to GI and had barium swallow on 1/19  but aspirated contrast. Now has modified barium swallow planned at [facility] later this week."  Esophagram was unremarkable except for aspiraiton.  MBS  ordered in house for pt due to pt's ongoing dysphagia issues. Per interview with pt and pt's daughter, [name], pt has been having dysphagia for approx six months.  Dysphagia coupled with gustatory deficits have caused pt to lose a significant amount of weight - most notably since November.  Pt states food stick in her throat causing her to drink liquids to clear or cough and expectorate.  She has undergone prior esophageal dilatation and daughter reports pt has problems with pills. Pt also with h/o cervical spine surgery and thyroid surgery.  Subjective: pt awake in chair, daughter *([name], RN and pt HCPOA) present  Recommendations for follow up therapy are one component of a multi-disciplinary discharge planning process, led by the attending physician.  Recommendations may be updated based on patient status, additional functional criteria and insurance authorization. Assessment / Plan / Recommendation Clinical Impressions 01/08/2022 Clinical Impression Patient presents with severe pharyngocervical dysphagia with both sensorimotor and obstructive dysphagia resulting in gross retention of barium throughout pharynx and aspiration of thin liquids.  Pt with inadequate epiglottic deflection due to it contacting posterior pharyngeal wall resulting in retention.  Her prior ACDF prevents her from conducing postures including chin tuck, head turn,etc.  With thin, pt aspirates due to decreased laryngeal closure and pharyngeal retention spilling into larynx during and after swallow.  Pt does have cough response but this did not clear aspirates.  Pt tested with HOB reclined to 30* to determine if would maintain boluses in posterior pharynx decreasing aspiration - however pt was noted to begin gagging after cued to "hock" and reports gagging occurs frequently during meals and thus repositioned her to upright position.  Decreasing amount to tsps prevented aspiration of thin.  Nectar cup boluses were not aspirated but were retained  in pharynx.  Pt swallowing of cracker resulted in severe pharyngeal retention - causing her to reflexively expectorate x2.  Suspect pt has component of chronic dysphagia due to position of cervical spine with current exacerbation due to current illness/deconditioning.  Concern present for pt's nutrition and aspiration risk given level of dysphagia.  Holly, daughter, present and educated to findings/recommendations. SLP Visit Diagnosis Dysphagia, pharyngoesophageal phase (R13.14);Dysphagia, pharyngeal phase (R13.13) Attention and concentration deficit following -- Frontal lobe and executive function deficit following -- Impact on safety and function Risk for inadequate nutrition/hydration;Moderate aspiration risk   Treatment Recommendations 01/08/2022 Treatment Recommendations Therapy as outlined in treatment plan below   Prognosis 01/08/2022 Prognosis for Safe Diet Advancement Guarded Barriers to Reach Goals Severity of deficits Barriers/Prognosis Comment -- Diet Recommendations 01/08/2022 SLP Diet Recommendations Nectar thick liquid;Thin liquid Liquid Administration via nectar via cup  or straw, thin via tsp Medication Administration suspension Compensations Follow solids with liquid;Small sips/bites;Slow rate; multiple swallows, throat clear and re-swallow Postural Changes Remain semi-upright after after feeds/meals (Comment);Seated upright at 90 degrees   Other Recommendations 01/08/2022 Recommended Consults -- Oral Care Recommendations Oral care QID Other Recommendations Order thickener from pharmacy;Have oral suction available;Clarify dietary restrictions Follow Up Recommendations Long-term institutional care without follow-up therapy Assistance recommended at discharge None Functional Status Assessment Patient has had a recent decline in their functional status and/or demonstrates limited ability to make significant improvements in function in a reasonable and predictable amount of time Frequency and Duration   01/08/2022 Speech Therapy Frequency (ACUTE ONLY) min 1 x/week Treatment Duration 1 week   Oral Phase 01/08/2022 Oral Phase Impaired Oral - Pudding Teaspoon -- Oral - Pudding Cup -- Oral - Honey Teaspoon -- Oral - Honey Cup -- Oral - Nectar Teaspoon WFL Oral - Nectar Cup WFL Oral - Nectar Straw WFL Oral - Thin Teaspoon WFL Oral - Thin Cup WFL Oral - Thin Straw WFL Oral - Puree WFL Oral - Mech Soft WFL Oral - Regular -- Oral - Multi-Consistency -- Oral - Pill -- Oral Phase - Comment --  Pharyngeal Phase 01/08/2022 Pharyngeal Phase Impaired Pharyngeal- Pudding Teaspoon -- Pharyngeal -- Pharyngeal- Pudding Cup -- Pharyngeal -- Pharyngeal- Honey Teaspoon -- Pharyngeal -- Pharyngeal- Honey Cup -- Pharyngeal -- Pharyngeal- Nectar Teaspoon Pharyngeal residue - valleculae;Pharyngeal residue - pyriform;Reduced epiglottic inversion Pharyngeal Material does not enter airway Pharyngeal- Nectar Cup Pharyngeal residue - valleculae;Pharyngeal residue - pyriform;Reduced epiglottic inversion;Penetration/Aspiration during swallow;Penetration/Apiration after swallow;Reduced anterior laryngeal mobility;Reduced laryngeal elevation Pharyngeal Material enters airway, remains ABOVE vocal cords and not ejected out Pharyngeal- Nectar Straw Pharyngeal residue - valleculae;Pharyngeal residue - pyriform;Reduced epiglottic inversion Pharyngeal Material does not enter airway Pharyngeal- Thin Teaspoon Pharyngeal residue - valleculae;Pharyngeal residue - pyriform;Penetration/Aspiration during swallow Pharyngeal Material does not enter airway Pharyngeal- Thin Cup Pharyngeal residue - valleculae;Pharyngeal residue - pyriform;Reduced epiglottic inversion;Penetration/Aspiration during swallow Pharyngeal Material enters airway, passes BELOW cords and not ejected out despite cough attempt by patient Pharyngeal- Thin Straw Pharyngeal residue - valleculae;Pharyngeal residue - pyriform;Penetration/Aspiration during swallow;Reduced epiglottic inversion;Reduced  airway/laryngeal closure;Reduced tongue base retraction Pharyngeal Material enters airway, passes BELOW cords and not ejected out despite cough attempt by patient Pharyngeal- Puree Pharyngeal residue - valleculae;Pharyngeal residue - pyriform;Reduced epiglottic inversion;Reduced pharyngeal peristalsis;Reduced tongue base retraction Pharyngeal Material does not enter airway Pharyngeal- Mechanical Soft Pharyngeal residue - valleculae;Pharyngeal residue - pyriform;Reduced epiglottic inversion;Pharyngeal residue - posterior pharnyx;Reduced tongue base retraction Pharyngeal Material does not enter airway Pharyngeal- Regular NT Pharyngeal -- Pharyngeal- Multi-consistency NT Pharyngeal -- Pharyngeal- Pill NT Pharyngeal -- Pharyngeal Comment Pt tested with HOB reclined to 30* to determine if would maintain boluses in posterior pharynx decreasing aspiration - however pt was noted to begin gagging after cued to "hock" and reports gagging occurs frequently during meals and thus repositioned her to upright position.  Decreasing amount to tsps prevented aspiration of thin.  Nectar cup boluses were not aspirated but were retained in pharynx.  Pt swallowing of cracker resulted in severe pharyngeal retention - causing her to reflexively expectorate x2.  Cervical Esophageal Phase  01/08/2022 Cervical Esophageal Phase Impaired Pudding Teaspoon -- Pudding Cup -- Honey Teaspoon -- Honey Cup -- Nectar Teaspoon -- Nectar Cup -- Nectar Straw -- Thin Teaspoon -- Thin Cup -- Thin Straw -- Puree -- Mechanical Soft -- Regular -- Multi-consistency -- Pill -- Cervical Esophageal Comment -- Kathleen Lime, MS [facility] Acute Rehab Services Office [phone]  Cell [phone] Macario Golds 01/08/2022, 4:56 PM                     ECHOCARDIOGRAM COMPLETE  Result Date: 01/09/2022    ECHOCARDIOGRAM REPORT   Patient Name:   Tasha Moss Date of Exam: 01/09/2022 Medical Rec #:  [id number]      Height:       65.0 in Accession #:    [id number]     Weight:        129.0 lb Date of Birth:  09-09-1936       BSA:          1.642 m Patient Age:    26 years       BP:           145/80 mmHg Patient Gender: F              HR:           82 bpm. Exam Location:  Inpatient Procedure: 2D Echo, 3D Echo, Cardiac Doppler and Color Doppler Indications:    I50.40* Unspecified combined systolic (congestive) and diastolic                 (congestive) heart failure  History:        Patient has no prior history of Echocardiogram examinations. CHF                 and Cardiomegaly, Signs/Symptoms:Dizziness/Lightheadedness; Risk                 Factors:Dyslipidemia and Hypertension.  Sonographer:    Roseanna Rainbow RDCS Referring Phys: [id number] [place]  1. Left ventricular ejection fraction by 3D volume is 65 %. The left ventricle has normal function. The left ventricle has no regional wall motion abnormalities. There is mild concentric left ventricular hypertrophy. Left ventricular diastolic parameters are consistent with Grade I diastolic dysfunction (impaired relaxation).  2. Right ventricular systolic function is normal. The right ventricular size is normal. Tricuspid regurgitation signal is inadequate for assessing PA pressure.  3. The mitral valve is normal in structure. Mild to moderate mitral valve regurgitation. No evidence of mitral stenosis.  4. The aortic valve is normal in structure. Aortic valve regurgitation is trivial. No aortic stenosis is present.  5. The inferior vena cava is normal in size with greater than 50% respiratory variability, suggesting right atrial pressure of 3 mmHg. FINDINGS  Left Ventricle: Left ventricular ejection fraction by 3D volume is 65 %. The left ventricle has normal function. The left ventricle has no regional wall motion abnormalities. The left ventricular internal cavity size was normal in size. There is mild concentric left ventricular hypertrophy. Left ventricular diastolic parameters are consistent with Grade I diastolic dysfunction  (impaired relaxation). Right Ventricle: The right ventricular size is normal. No increase in right ventricular wall thickness. Right ventricular systolic function is normal. Tricuspid regurgitation signal is inadequate for assessing PA pressure. Left Atrium: Left atrial size was normal in size. Right Atrium: Right atrial size was normal in size. Pericardium: There is no evidence of pericardial effusion. Mitral Valve: The mitral valve is normal in structure. Mild to moderate mitral valve regurgitation. No evidence of mitral valve stenosis. Tricuspid Valve: The tricuspid valve is normal in structure. Tricuspid valve regurgitation is trivial. No evidence of tricuspid stenosis. Aortic Valve: The aortic valve is normal in structure. Aortic valve regurgitation is trivial. No aortic stenosis is present. Pulmonic Valve: The pulmonic valve was normal in  structure. Pulmonic valve regurgitation is not visualized. No evidence of pulmonic stenosis. Aorta: The aortic root is normal in size and structure. Venous: The inferior vena cava is normal in size with greater than 50% respiratory variability, suggesting right atrial pressure of 3 mmHg. IAS/Shunts: No atrial level shunt detected by color flow Doppler.  LEFT VENTRICLE PLAX 2D LVIDd:         3.50 cm         Diastology LVIDs:         2.20 cm         LV e' medial:    5.98 cm/s LV PW:         1.00 cm         LV E/e' medial:  10.5 LV IVS:        1.10 cm         LV e' lateral:   6.42 cm/s LVOT diam:     2.10 cm         LV E/e' lateral: 9.8 LV SV:         78 LV SV Index:   47 LVOT Area:     3.46 cm        3D Volume EF                                LV 3D EF:    Left                                             ventricul LV Volumes (MOD)                            ar LV vol d, MOD    64.4 ml                    ejection A2C:                                        fraction LV vol d, MOD    49.4 ml                    by 3D A4C:                                        volume is LV vol s,  MOD    19.8 ml                    65 %. A2C: LV vol s, MOD    17.0 ml A4C:                           3D Volume EF: LV SV MOD A2C:   44.6 ml       3D EF:        65 % LV SV MOD A4C:   49.4 ml       LV EDV:       96 ml LV SV MOD BP:    38.1 ml  LV ESV:       33 ml                                LV SV:        63 ml RIGHT VENTRICLE RV S prime:     11.60 cm/s TAPSE (M-mode): 1.3 cm LEFT ATRIUM             Index        RIGHT ATRIUM          Index LA diam:        2.30 cm 1.40 cm/m   RA Area:     6.60 cm LA Vol (A2C):   33.4 ml 20.35 ml/m  RA Volume:   9.78 ml  5.96 ml/m LA Vol (A4C):   26.3 ml 16.02 ml/m LA Biplane Vol: 29.4 ml 17.91 ml/m  AORTIC VALVE             PULMONIC VALVE LVOT Vmax:   129.00 cm/s PR End Diast Vel: 1.57 msec LVOT Vmean:  81.500 cm/s LVOT VTI:    0.225 m  AORTA Ao Root diam: 3.20 cm Ao Asc diam:  3.30 cm MITRAL VALVE MV Area (PHT): 3.22 cm    SHUNTS MV Decel Time: 236 msec    Systemic VTI:  0.22 m MR PISA:        0.25 cm   Systemic Diam: 2.10 cm MR PISA Radius: 0.20 cm MV E velocity: 62.90 cm/s MV A velocity: 78.20 cm/s MV E/A ratio:  0.80 Kardie Tobb DO Electronically signed by Berniece Salines DO Signature Date/Time: 01/09/2022/5:48:42 PM    Final     Labs:  CBC: Recent Labs    01/08/22 0805 01/09/22 0344 01/11/22 0348 01/12/22 0425  WBC 9.6 7.3 5.8 6.6  HGB 13.2 12.1 12.4 12.6  HCT 38.6 36.2 37.4 38.9  PLT 252 238 248 260    COAGS: Recent Labs    01/07/22 1534  INR 1.0    BMP: Recent Labs    01/08/22 0805 01/09/22 0344 01/11/22 0348 01/12/22 0425  NA 133* 132* 131* 127*  K 3.6 3.6 3.6 3.5  CL 94* 93* 92* 88*  CO2 31 32 32 32  GLUCOSE 133* 114* 110* 108*  BUN 9 13 10  7*  CALCIUM 8.5* 8.8* 8.6* 8.3*  CREATININE 0.48 0.59 0.45 0.44  GFRNONAA >60 >60 >60 >60    LIVER FUNCTION TESTS: Recent Labs    01/07/22 1534 01/08/22 0805 01/09/22 0344 01/11/22 0348  BILITOT 0.4 1.0 0.8 0.6  AST 23 20 16 17   ALT 18 16 14 15   ALKPHOS 60 60 58 60  PROT 6.7 6.0*  5.7* 5.4*  ALBUMIN 3.4* 3.0* 2.8* 2.8*    TUMOR MARKERS: No results for input(s): AFPTM, CEA, CA199, CHROMGRNA in the last 8760 hours.  Assessment and Plan: 86 y.o. female with past medical history of anxiety, GERD, esophageal stricture with likely dysmotility , vertigo, ?CHF, diverticulosis, hyperlipidemia, hypertension, prior cervical spine surgery as well as partial thyroidectomy and hypothyroidism who was admitted on 01/07/2022 with failure to thrive and oropharyngeal dysphagia/aspiration. She underwent EGD on 2/3 with dilation of mild wrent at [facility] -no esoph lesions were noted, biopsies were taken of erythematous mucosa in the stomach, no lesions in the duodenal bulb; hiatal hernia present. Dilation did not improve pt's symptoms. She is currently only on thin liquid/nectar thick liquid diet. She is afebrile with nl WBC, nl  creat, nl PT/INR. Covid 19 neg. Imaging studies have been reviewed by Dr. Dwaine Gale.  Patient has borderline favorable anatomy for percutaneous gastrostomy tube placement due to close proximity of overlying bowel loops to stomach.  We will plan to administer oral contrast to patient night before procedure and plan procedure for 2/7. Risks and benefits image guided gastrostomy tube placement was discussed with the patient /daughter Rhodia Albright including, but not limited to the need for a barium enema during the procedure, bleeding, infection, peritonitis and/or damage to adjacent structures as well as inability to place tube.  All of the patient's questions were answered, patient is agreeable to proceed.  Consent signed and in chart.    Thank you for this interesting consult.  I greatly enjoyed meeting CAROLL WEINHEIMER and look forward to participating in their care.  A copy of this report was sent to the requesting provider on this date.  Electronically Signed: D. Rowe Robert, PA-C 01/13/2022, 9:06 AM   I spent a total of  30 minutes   in face to face in clinical consultation,  greater than 50% of which was counseling/coordinating care for percutaneous gastrostomy tube placement

## 2022-01-13 NOTE — Progress Notes (Signed)
[facility]  PROGRESS NOTE  Tasha Moss QIO:[id number] DOB: 1936-05-26 DOA: 01/07/2022 PCP: [name], MD   Brief HPI:   86 year old female with history of BPPV, hypertension, hyperlipidemia, hypothyroidism s/p partial thyroidectomy, GERD, esophageal stricture presented with generalized weakness.  She was admitted in November with left hip dislocation s/p reduction and discharged to skilled nursing facility.  She got COVID in rehab and subsequently developed vertigo with nausea and vomiting.  She also had progressive dysphagia with issue of passage of solid food.  She underwent barium swallow per GI on 12/27/2021.  She aspirated into trachea and plan was for MBS.  She continued to have dizziness and vertigo with nausea vomiting and also developed progressive weakness.  Patient was admitted  with failure to thrive.    Subjective   Patient seen and examined, denies dizziness.  Still has poor p.o. intake.  Plan for PEG tube placement per IR on Tuesday.   Assessment/Plan:     Failure to thrive -Secondary to oropharyngeal dysphagia -Patient and family have decided to go with PEG tube placement -IR consulted for gastrostomy tube on Tuesday   Dysphagia -Followed by [facility] as outpatient -Underwent upper GI series as outpatient on 12/27/2021 with aspiration of contrast into trachea at level of mainstem bronchi -Speech therapy was consulted during this hospital admission, MBS showed pharyngeal esophageal and pharyngeal phase dysphagia-chronic dysphagia thought due to position of cervical spine with exacerbation related to her current illness/deconditioning -Speech therapy recommended full liquid, nectar thick diet -GI consulted, EGD was done yesterday; EGD was unremarkable, also esophagus was dilated -No significant improvement after EGD -Family decided for PEG tube placement, IR consulted for gastrostomy tube placement on Tuesday -Continue PPI  Benign paroxysmal positional  vertigo -Complains of dizziness this morning, refusing to take meclizine. -Patient is already on scopolamine patch, will avoid adding Dramamine to avoid anticholinergic side effects -Head CT on 01/07/2022 showed no acute abnormality. -PT/OT, vestibular  rehab  Chronic diastolic CHF -Does not appear to be volume overloaded -She received Lasix x1 on 01/08/2022 -Chest x-ray obtained today shows no worsening of CHF -Echocardiogram obtained today shows EF 65% with grade 1 diastolic dysfunction, also shows mild to moderate mitral regurgitation  Bilateral buttock cellulitis -Significantly improved after starting  Keflex 500 mg p.o. twice daily -We will complete total 5 days of treatment and stop antibiotics  Oral thrush -Cleared after starting  nystatin 5 ml 4 times daily for total 7 days  Hypothyroidism -Continue Synthroid 125 mcg daily -TSH 6.5, mildly elevated likely from sick euthyroid syndrome -Repeat TSH in 4 to 6 weeks as outpatient  Sacral lulcer -Change mattress to air overlay mattress  Hyponatremia -Likely from poor p.o. intake/dehydration and also excessive free water intake as patient is on full liquid diet only -Serum osmolality 275 -We will avoid giving IV fluids due to underlying history of CHF -Hopefully should improve after starting nutrition with PEG tube placement      Medications     cephALEXin  500 mg Oral Q12H   cholecalciferol  1,000 Units Oral Daily   enoxaparin (LOVENOX) injection  40 mg Subcutaneous Q24H   feeding supplement  237 mL Oral TID BM   FLUoxetine  20 mg Oral Daily   furosemide  40 mg Intravenous Once   levothyroxine  125 mcg Oral QAC breakfast   loratadine  10 mg Oral Daily   multivitamin with minerals  1 tablet Oral Daily   nystatin  5 mL Mouth/Throat QID  ondansetron (ZOFRAN) IV  4 mg Intravenous Once   pantoprazole  40 mg Oral Daily   polyethylene glycol  17 g Oral Daily   polyvinyl alcohol  2 drop Both Eyes Daily   scopolamine  1  patch Transdermal Q72H   vitamin B-12  250 mcg Oral Daily     Data Reviewed:   CBG:  No results for input(s): GLUCAP in the last 168 hours.  SpO2: 94 % O2 Flow Rate (L/min): 2 L/min    Vitals:   01/12/22 1454 01/12/22 2057 01/12/22 2248 01/13/22 0632  BP: (!) 141/72 140/71 116/71 127/62  Pulse: 85 86 90 81  Resp: 20 18  16   Temp: 97.8 F (36.6 C) 98.2 F (36.8 C)  98.1 F (36.7 C)  TempSrc: Oral Oral  Oral  SpO2: 94% 95% 94% 94%  Weight:      Height:          Data Reviewed:  Basic Metabolic Panel: Recent Labs  Lab 01/08/22 0335 01/08/22 0805 01/09/22 0344 01/11/22 0348 01/12/22 0425  NA 133* 133* 132* 131* 127*  K 3.5 3.6 3.6 3.6 3.5  CL 96* 94* 93* 92* 88*  CO2 30 31 32 32 32  GLUCOSE 115* 133* 114* 110* 108*  BUN 8 9 13 10  7*  CREATININE 0.42* 0.48 0.59 0.45 0.44  CALCIUM 8.3* 8.5* 8.8* 8.6* 8.3*  MG  --   --  1.7  --   --   PHOS  --   --  4.6  --   --     CBC: Recent Labs  Lab 01/07/22 1534 01/08/22 0805 01/09/22 0344 01/11/22 0348 01/12/22 0425  WBC 6.4 9.6 7.3 5.8 6.6  NEUTROABS 4.6  --  5.2  --   --   HGB 13.7 13.2 12.1 12.4 12.6  HCT 40.7 38.6 36.2 37.4 38.9  MCV 92.3 92.1 93.1 93.3 94.6  PLT 278 252 238 248 260       Antibiotics: Anti-infectives (From admission, onward)    Start     Dose/Rate Route Frequency Ordered Stop   01/15/22 1400  ceFAZolin (ANCEF) IVPB 2g/100 mL premix        2 g 200 mL/hr over 30 Minutes Intravenous To Radiology 01/13/22 0933 01/16/22 1400   01/13/22 2200  cephALEXin (KEFLEX) capsule 500 mg        500 mg Oral Every 12 hours 01/13/22 1347 01/14/22 2159   01/09/22 1630  cephALEXin (KEFLEX) capsule 500 mg  Status:  Discontinued        500 mg Oral Every 12 hours 01/09/22 1531 01/13/22 1347        DVT prophylaxis: Lovenox  Code Status: Full code  Family Communication: Discussed with patient's daughter at bedside      Objective    Physical Examination:   General-appears in no acute  distress Heart-S1-S2, regular, no murmur auscultated Lungs-clear to auscultation bilaterally, no wheezing or crackles auscultated Abdomen-soft, nontender, no organomegaly Extremities-no edema in the lower extremities Neuro-alert, oriented x3, no focal deficit noted  Status is: Inpatient due to failure to thrive, progressive dysphagia    Pressure Injury 01/07/22 Perineum Right (Active)  01/07/22 2130  Location: Perineum  Location Orientation: Right  Staging:   Wound Description (Comments):   Present on Admission: Yes          [place]   [facility] If 7PM-7AM, please contact night-coverage at [email or web address], Office  [phone]   01/13/2022, 1:47 PM  LOS: 5 days

## 2022-01-13 NOTE — Progress Notes (Signed)
Physical Therapy Treatment Patient Details Name: Tasha Moss MRN: [id number] DOB: 1936/05/04 Today's Date: 01/13/2022   History of Present Illness Patient is 86 y.o. female presented to [facility] for progressive weakness, dizziness, vertigo, and N/V. patient recently admitted for Lt hip dislocation in November 2022 and discharge to SNF where she caught COVID and ifrst developed vertigo symptoms. PMH signifnicant for HLD, HTN, BPPV, hypothyroidism, thyroidectomy, GERD, anxiety, OA, Lt TSA, bil THA in 1990's.    PT Comments    Pt progressing with PT. Tolerated incr gait distance and reviewed exercises with only brief rest between.  HE 86-120. No dizziness with mobility today.  Pt and family confirm the plan is for home after pt gets PEG tube  Recommendations for follow up therapy are one component of a multi-disciplinary discharge planning process, led by the attending physician.  Recommendations may be updated based on patient status, additional functional criteria and insurance authorization.  Follow Up Recommendations  Home health PT     Assistance Recommended at Discharge Frequent or constant Supervision/Assistance  Patient can return home with the following A little help with walking and/or transfers;A little help with bathing/dressing/bathroom;Assistance with cooking/housework;Direct supervision/assist for medications management;Assist for transportation;Help with stairs or ramp for entrance   Equipment Recommendations  None recommended by PT    Recommendations for Other Services       Precautions / Restrictions Precautions Precautions: Fall Restrictions Weight Bearing Restrictions: No     Mobility  Bed Mobility               General bed mobility comments: in recliner on arrival    Transfers Overall transfer level: Needs assistance Equipment used: Rolling walker (2 wheels) Transfers: Sit to/from Stand Sit to Stand: Min guard, Min assist           General  transfer comment: Min assist to min/guard with cues for hand placement to power up from recliner and to control descent    Ambulation/Gait Ambulation/Gait assistance: Min guard, Min assist Gait Distance (Feet): 50 Feet Assistive device: Rolling walker (2 wheels) Gait Pattern/deviations: Step-through pattern, Decreased stride length, Trunk flexed       General Gait Details: cues for trunk extension and proximity to RW as pt has tendency to move walker too far ahead of self. intermittent assist needed for walker and cues to guide direction to negotiate obstacles in hallway   Stairs             Wheelchair Mobility    Modified Rankin (Stroke Patients Only)       Balance   Sitting-balance support: Feet supported, Bilateral upper extremity supported Sitting balance-Leahy Scale: Fair     Standing balance support: Bilateral upper extremity supported, Reliant on assistive device for balance, During functional activity Standing balance-Leahy Scale: Poor                              Cognition Arousal/Alertness: Awake/alert Behavior During Therapy: WFL for tasks assessed/performed Overall Cognitive Status: Within Functional Limits for tasks assessed                                 General Comments: asking what part PT was going  to "work on", seemed less anxious overall during today's session; mildly distracted with dtr's conversation on speaker phone        Exercises General Exercises - Lower Extremity Ankle Circles/Pumps: AROM,  Both, 10 reps Quad Sets: AROM, Both, 10 reps Long Arc Quad: AROM, Both, 10 reps, Seated Heel Slides: AROM, Both, 10 reps Hip ABduction/ADduction: AROM, Both, 10 reps Other Exercises Other Exercises: gentle AROM for Cspine in supine: 5x Rt/Lt rotation, 5x chin tuck/ext. (pt able to return demo from previous session)    General Comments        Pertinent Vitals/Pain Pain Assessment Pain Assessment: No/denies  pain Pain Intervention(s): Monitored during session    Home Living                          Prior Function            PT Goals (current goals can now be found in the care plan section) Acute Rehab PT Goals Patient Stated Goal: stop being dizzy PT Goal Formulation: With patient/family Time For Goal Achievement: 01/23/22 Potential to Achieve Goals: Good Progress towards PT goals: Progressing toward goals    Frequency    Min 3X/week      PT Plan Current plan remains appropriate    Co-evaluation              AM-PAC PT "6 Clicks" Mobility   Outcome Measure  Help needed turning from your back to your side while in a flat bed without using bedrails?: A Little Help needed moving from lying on your back to sitting on the side of a flat bed without using bedrails?: A Little Help needed moving to and from a bed to a chair (including a wheelchair)?: A Little Help needed standing up from a chair using your arms (e.g., wheelchair or bedside chair)?: A Little Help needed to walk in hospital room?: A Little Help needed climbing 3-5 steps with a railing? : A Little 6 Click Score: 18    End of Session Equipment Utilized During Treatment: Gait belt Activity Tolerance: Patient tolerated treatment well Patient left: in chair;with call bell/phone within reach;with chair alarm set;with family/visitor present Nurse Communication: Mobility status PT Visit Diagnosis: Unsteadiness on feet (R26.81);Other abnormalities of gait and mobility (R26.89);Muscle weakness (generalized) (M62.81);Difficulty in walking, not elsewhere classified (R26.2)     Time: 3295-1884 PT Time Calculation (min) (ACUTE ONLY): 24 min  Charges:  $Gait Training: 8-22 mins $Therapeutic Exercise: 8-22 mins                     Baxter Flattery, PT  Acute Rehab Dept ([place]) [phone] Pager [phone]  01/13/2022    [facility] 01/13/2022, 2:43 PM

## 2022-01-13 NOTE — Consult Note (Signed)
Consultation Note Date: 01/13/2022   Patient Name: Tasha Moss  DOB: 1936/08/09  MRN: [id number]  Age / Sex: 86 y.o., female  PCP: [name], MD Referring Physician: Oswald Hillock, MD  Reason for Consultation: Establishing goals of care  HPI/Patient Profile: 86 y.o. female  admitted on 01/07/2022   Clinical Assessment and Goals of Care: 86 year old lady who lives at home with her family in [place], [place].  Patient has husband and 2 daughters.  She has a past medical history of GERD esophageal stricture vertigo heart failure diverticulosis prior spine surgery.  Patient was admitted with failure to thrive oropharyngeal dysphagia and aspiration.  She underwent upper endoscopy with dilation of upper esophageal sphincter, currently on nectar thick liquid diet.  Interventional radiology has been consulted for PEG tube placement. Palliative consultation for broad goals of care discussions has been requested. Patient is awake alert resting in bed.  She is a little hard of hearing but is completely alert oriented and able to understand all aspects of her current illness as well as underlying conditions. Palliative medicine is specialized medical care for people living with serious illness. It focuses on providing relief from the symptoms and stress of a serious illness. The goal is to improve quality of life for both the patient and the family. Goals of care: Broad aims of medical therapy in relation to the patient's values and preferences. Our aim is to provide medical care aimed at enabling patients to achieve the goals that matter most to them, given the circumstances of their particular medical situation and their constraints.  Patient states that she had to undergo hip surgery in November, she went for rehab after that, she had COVID-19 infection as well.  Patient states that since around  Thanksgiving of 2022, she has had gradual progressive functional decline as well as has not been able to eat meaningfully.  She has been losing weight. Talks about undergoing an upper endoscopy and about the fact that she will be undergoing PEG tube placement soon.  She has given her consent for this procedure.  We discussed about purpose of PEG tube being to provide nutrition and that she could still continue with nectar thick liquids as much as possible.  Patient states she is very concerned about her choking.  She hopes to go home towards the end of this hospitalization. Advance care planning documents are uploaded in the chart, these were reviewed and also discussed with the patient.  Patient states that she does recall completing advance care planning documents.  She wishes to continue with full CODE STATUS for now, however she states that that her husband and her daughters will best to represent her wishes if she is ever not able to speak for herself. Call placed and discussed with daughter Tasha Moss, discussed about scope of outpatient palliative services, monitoring how the patient does with PEG tube placement and initiation of tube feeds.  All of their questions and concerns addressed to the best of my ability, see below.  HCPOA  Daughter Tasha Moss who is also our nursing colleagues here at [facility]   Full code for now PEG tube placement Home with palliative services upon discharge to monitor her overall disease trajectory of illness, oral intake and to continue broad goals of care discussions with patient and family. Thank you for the consult.  Code Status/Advance Care Planning: Full code   Symptom Management:     Palliative Prophylaxis:  Delirium Protocol   Psycho-social/Spiritual:  Desire for further Chaplaincy support:yes Additional Recommendations: Caregiving  Support/Resources  Prognosis:  Unable to determine  Discharge Planning: Home  with Palliative Services      Primary Diagnoses: Present on Admission:  Failure to thrive in adult  Hypothyroidism  Benign paroxysmal positional vertigo   I have reviewed the medical record, interviewed the patient and family, and examined the patient. The following aspects are pertinent.  Past Medical History:  Diagnosis Date   Allergy    seasonal & dust   Anxiety    Arthritis    DJD   Family history of adverse reaction to anesthesia    n/v    GERD (gastroesophageal reflux disease)    History of vertigo    Hyperlipidemia    Hypertension    Hypothyroidism    PONV (postoperative nausea and vomiting)    Thyroid disease    hypothyroidism post Hashimoto's & partial thyroidectomy   Social History   Socioeconomic History   Marital status: Married    Spouse name: Not on file   Number of children: 2   Years of education: Not on file   Highest education level: Not on file  Occupational History   Not on file  Tobacco Use   Smoking status: Never   Smokeless tobacco: Never  Vaping Use   Vaping Use: Never used  Substance and Sexual Activity   Alcohol use: No   Drug use: No   Sexual activity: Not on file  Other Topics Concern   Not on file  Social History Narrative   Married for 25 years    Daughter-nurse   Daughter- paralegal       Right handed   Social Determinants of Health   Financial Resource Strain: Low Risk    Difficulty of Paying Living Expenses: Not hard at all  Food Insecurity: No Food Insecurity   Worried About Charity fundraiser in the Last Year: Never true   Ran Out of Food in the Last Year: Never true  Transportation Needs: No Transportation Needs   Lack of Transportation (Medical): No   Lack of Transportation (Non-Medical): No  Physical Activity: Inactive   Days of Exercise per Week: 0 days   Minutes of Exercise per Session: 0 min  Stress: No Stress Concern Present   Feeling of Stress : Not at all  Social Connections: Moderately Integrated    Frequency of Communication with Friends and Family: Twice a week   Frequency of Social Gatherings with Friends and Family: Twice a week   Attends Religious Services: More than 4 times per year   Active Member of [employer] or Organizations: No   Attends Archivist Meetings: Never   Marital Status: Married   Family History  Problem Relation Age of Onset   Diabetes Mother    Hepatitis Mother        Hepatitis B,? etiology   Miscarriages / Stillbirths Mother    Stroke Mother 27   Heart disease Father  CHF   Cancer Paternal Aunt        GI cancers   Heart disease Paternal Aunt        CHF   Cancer Paternal Uncle        lung, GI   Heart disease Paternal Uncle        CHF   Scheduled Meds:  cephALEXin  500 mg Oral Q12H   cholecalciferol  1,000 Units Oral Daily   enoxaparin (LOVENOX) injection  40 mg Subcutaneous Q24H   feeding supplement  237 mL Oral TID BM   FLUoxetine  20 mg Oral Daily   furosemide  40 mg Intravenous Once   levothyroxine  125 mcg Oral QAC breakfast   loratadine  10 mg Oral Daily   multivitamin with minerals  1 tablet Oral Daily   nystatin  5 mL Mouth/Throat QID   ondansetron (ZOFRAN) IV  4 mg Intravenous Once   pantoprazole  40 mg Oral Daily   polyethylene glycol  17 g Oral Daily   polyvinyl alcohol  2 drop Both Eyes Daily   scopolamine  1 patch Transdermal Q72H   vitamin B-12  250 mcg Oral Daily   Continuous Infusions:  [START ON 01/15/2022]  ceFAZolin (ANCEF) IV     PRN Meds:.acetaminophen, ondansetron Medications Prior to Admission:  Prior to Admission medications   Medication Sig Start Date End Date Taking? Authorizing Provider  acetaminophen (TYLENOL) 500 MG tablet Take 500 mg by mouth every 6 (six) hours as needed for moderate pain.   Yes [provider]  Cholecalciferol (VITAMIN D3) 1000 UNITS CAPS Take 1,000 Units by mouth daily.   Yes [provider]  Cranberry 200 MG CAPS Take 1 capsule by mouth daily.   Yes  [provider]  diclofenac Sodium (VOLTAREN) 1 % GEL Apply 2 g topically 4 (four) times daily as needed (Pain). 12/04/21  Yes Gerlene Fee, NP  dimenhyDRINATE (DRAMAMINE) 50 MG tablet Take 50 mg by mouth every 6 (six) hours as needed for dizziness.   Yes [provider]  FLUoxetine (PROZAC) 20 MG capsule Take 1 capsule (20 mg total) by mouth daily. 12/04/21  Yes Gerlene Fee, NP  fluticasone (FLONASE) 50 MCG/ACT nasal spray Place 1 spray into both nostrils daily.   Yes [provider]  levothyroxine (SYNTHROID) 125 MCG tablet Take 1 tablet (125 mcg total) by mouth daily before breakfast. 12/04/21  Yes Gerlene Fee, NP  loratadine (CLARITIN) 10 MG tablet Take 10 mg by mouth daily.   Yes [provider]  meloxicam (MOBIC) 7.5 MG tablet Take 1 tablet (7.5 mg total) by mouth 2 (two) times daily. 12/04/21  Yes Gerlene Fee, NP  Multiple Vitamin (MULTIVITAMIN PO) Take 400 mg by mouth daily.   Yes [provider]  Nutritional Supplements (ENSURE ENLIVE PO) Take 237 mLs by mouth 2 (two) times daily.   Yes [provider]  ondansetron (ZOFRAN) 8 MG tablet Take 1 tablet (8 mg total) by mouth 3 (three) times daily before meals. Patient taking differently: Take 8 mg by mouth every 8 (eight) hours as needed for vomiting or nausea. 12/04/21  Yes Gerlene Fee, NP  pantoprazole (PROTONIX) 40 MG tablet Take 1 tablet (40 mg total) by mouth daily. 12/04/21  Yes Gerlene Fee, NP  Polyvinyl Alcohol-Povidone (ARTIFICIAL TEARS) 5-6 MG/ML SOLN Place 2 drops into both eyes daily.   Yes [provider]  scopolamine (TRANSDERM-SCOP) 1 MG/3DAYS Place 1 patch (1.5 mg total) onto  the skin every 3 (three) days. 12/07/21  Yes [name], MD  vitamin B-12 (CYANOCOBALAMIN) 250 MCG tablet Take 250 mcg by mouth daily.   Yes [provider]  polyethylene glycol powder (GLYCOLAX/MIRALAX) 17 GM/SCOOP powder Take 17 g by mouth 2 (two)  times daily as needed. Patient not taking: Reported on 12/20/2021 12/07/21   [name], MD   Allergies  Allergen Reactions   Gabapentin     hallucinations   Other     Strong pain medications cause n/v   Morphine     Nausea & vomiting   Review of Systems + weakness  Physical Exam Patient is awake alert resting in bed _Hard of hearing but is completely alert and oriented, is able to provide all aspects of her history and answers questions appropriately Regular work of breathing Abdomen is not distended S1-S2 No focal deficits    Vital Signs: BP 127/62 (BP Location: Left Arm)    Pulse 81    Temp 98.1 F (36.7 C) (Oral)    Resp 16    Ht 5\' 5"  (1.651 m)    Wt 57.4 kg    SpO2 94%    BMI 21.06 kg/m  Pain Scale: 0-10   Pain Score: Asleep   SpO2: SpO2: 94 % O2 Device:SpO2: 94 % O2 Flow Rate: .O2 Flow Rate (L/min): 2 L/min  IO: Intake/output summary:  Intake/Output Summary (Last 24 hours) at 01/13/2022 1013 Last data filed at 01/13/2022 0500 Gross per 24 hour  Intake --  Output 200 ml  Net -200 ml    LBM: Last BM Date: 01/11/22 Baseline Weight: Weight: 60 kg Most recent weight: Weight: 57.4 kg     Palliative Assessment/Data:   PPS 50%  Time In:  9 Time Out: 10  Time Total:   60  Greater than 50%  of this time was spent counseling and coordinating care related to the above assessment and plan.  Signed by: Loistine Chance, MD   Please contact Palliative Medicine Team phone at [phone] for questions and concerns.  For individual provider: See Shea Evans

## 2022-01-14 ENCOUNTER — Encounter: Payer: Self-pay | Admitting: Gastroenterology

## 2022-01-14 ENCOUNTER — Encounter (HOSPITAL_COMMUNITY): Payer: Self-pay | Admitting: Gastroenterology

## 2022-01-14 DIAGNOSIS — E440 Moderate protein-calorie malnutrition: Secondary | ICD-10-CM | POA: Diagnosis not present

## 2022-01-14 DIAGNOSIS — H8113 Benign paroxysmal vertigo, bilateral: Secondary | ICD-10-CM | POA: Diagnosis not present

## 2022-01-14 DIAGNOSIS — R1312 Dysphagia, oropharyngeal phase: Secondary | ICD-10-CM | POA: Diagnosis not present

## 2022-01-14 DIAGNOSIS — R627 Adult failure to thrive: Secondary | ICD-10-CM | POA: Diagnosis not present

## 2022-01-14 LAB — BASIC METABOLIC PANEL
Anion gap: 6 (ref 5–15)
BUN: 10 mg/dL (ref 8–23)
CO2: 34 mmol/L — ABNORMAL HIGH (ref 22–32)
Calcium: 8.7 mg/dL — ABNORMAL LOW (ref 8.9–10.3)
Chloride: 93 mmol/L — ABNORMAL LOW (ref 98–111)
Creatinine, Ser: 0.51 mg/dL (ref 0.44–1.00)
GFR, Estimated: >60 mL/min (ref >60)
Glucose, Bld: 117 mg/dL — ABNORMAL HIGH (ref 70–99)
Potassium: 3.8 mmol/L (ref 3.5–5.1)
Sodium: 133 mmol/L — ABNORMAL LOW (ref 135–145)

## 2022-01-14 LAB — SURGICAL PATHOLOGY

## 2022-01-14 NOTE — Care Management Important Message (Signed)
Important Message  Patient Details IM Letter placed in Patients room. Name: Tasha Moss MRN: [id number] Date of Birth: 1936/10/14   Medicare Important Message Given:  Yes     Kerin Salen 01/14/2022, 12:39 PM

## 2022-01-14 NOTE — Progress Notes (Signed)
[facility]  PROGRESS NOTE  Tasha Moss NLG:[id number] DOB: Sep 25, 1936 DOA: 01/07/2022 PCP: [name], MD   Brief HPI:   86 year old female with history of BPPV, hypertension, hyperlipidemia, hypothyroidism s/p partial thyroidectomy, GERD, esophageal stricture presented with generalized weakness.  She was admitted in November with left hip dislocation s/p reduction and discharged to skilled nursing facility.  She got COVID in rehab and subsequently developed vertigo with nausea and vomiting.  She also had progressive dysphagia with issue of passage of solid food.  She underwent barium swallow per GI on 12/27/2021.  She aspirated into trachea and plan was for MBS.  She continued to have dizziness and vertigo with nausea vomiting and also developed progressive weakness.  Patient was admitted  with failure to thrive.    Subjective   Patient seen and examined, no new complaints.  Plan for PEG tube placement tomorrow.   Assessment/Plan:     Failure to thrive -Secondary to oropharyngeal dysphagia -Patient and family have decided to go with PEG tube placement -IR consulted for gastrostomy tube on Tuesday   Dysphagia -Followed by [facility] as outpatient -Underwent upper GI series as outpatient on 12/27/2021 with aspiration of contrast into trachea at level of mainstem bronchi -Speech therapy was consulted during this hospital admission, MBS showed pharyngeal esophageal and pharyngeal phase dysphagia-chronic dysphagia thought due to position of cervical spine with exacerbation related to her current illness/deconditioning -Speech therapy recommended full liquid, nectar thick diet -GI consulted, EGD was done yesterday; EGD was unremarkable, also esophagus was dilated -No significant improvement after EGD -Family decided for PEG tube placement, IR consulted for gastrostomy tube placement on Tuesday -Continue PPI  Benign paroxysmal positional vertigo -Complains of dizziness this  morning, refusing to take meclizine. -Patient is already on scopolamine patch, will avoid adding Dramamine to avoid anticholinergic side effects -Head CT on 01/07/2022 showed no acute abnormality. -PT/OT, vestibular  rehab  Chronic diastolic CHF -Does not appear to be volume overloaded -She received Lasix x1 on 01/08/2022 -Chest x-ray obtained today shows no worsening of CHF -Echocardiogram obtained today shows EF 65% with grade 1 diastolic dysfunction, also shows mild to moderate mitral regurgitation  Bilateral buttock cellulitis -Significantly improved after starting  Keflex 500 mg p.o. twice daily -Completed 5 days of treatment today  Oral thrush -Cleared after starting  nystatin 5 ml 4 times daily for total 7 days  Hypothyroidism -Continue Synthroid 125 mcg daily -TSH 6.5, mildly elevated likely from sick euthyroid syndrome -Repeat TSH in 4 to 6 weeks as outpatient  Sacral lulcer -Change mattress to air overlay mattress  Hyponatremia -Likely from poor p.o. intake/dehydration and also excessive free water intake as patient is on full liquid diet only -Improved, today sodium is 133 -Serum osmolality 275 -We will avoid giving IV fluids due to underlying history of CHF       Medications     cholecalciferol  1,000 Units Oral Daily   enoxaparin (LOVENOX) injection  40 mg Subcutaneous Q24H   feeding supplement  237 mL Oral TID BM   FLUoxetine  20 mg Oral Daily   furosemide  40 mg Intravenous Once   levothyroxine  125 mcg Oral QAC breakfast   loratadine  10 mg Oral Daily   multivitamin with minerals  1 tablet Oral Daily   nystatin  5 mL Mouth/Throat QID   ondansetron (ZOFRAN) IV  4 mg Intravenous Once   pantoprazole  40 mg Oral Daily   polyethylene glycol  17 g Oral  Daily   polyvinyl alcohol  2 drop Both Eyes Daily   scopolamine  1 patch Transdermal Q72H   vitamin B-12  250 mcg Oral Daily     Data Reviewed:   CBG:  No results for input(s): GLUCAP in the last 168  hours.  SpO2: 96 % O2 Flow Rate (L/min): 2 L/min    Vitals:   01/13/22 2025 01/14/22 0400 01/14/22 0553 01/14/22 1352  BP: 134/70  129/67 (!) 161/80  Pulse: 99  82 (!) 102  Resp: 20  20 18   Temp: 98.6 F (37 C)  97.9 F (36.6 C) 97.8 F (36.6 C)  TempSrc: Oral  Oral Oral  SpO2: 93%  95% 96%  Weight:  57.2 kg    Height:          Data Reviewed:  Basic Metabolic Panel: Recent Labs  Lab 01/08/22 0805 01/09/22 0344 01/11/22 0348 01/12/22 0425 01/14/22 0334  NA 133* 132* 131* 127* 133*  K 3.6 3.6 3.6 3.5 3.8  CL 94* 93* 92* 88* 93*  CO2 31 32 32 32 34*  GLUCOSE 133* 114* 110* 108* 117*  BUN 9 13 10  7* 10  CREATININE 0.48 0.59 0.45 0.44 0.51  CALCIUM 8.5* 8.8* 8.6* 8.3* 8.7*  MG  --  1.7  --   --   --   PHOS  --  4.6  --   --   --     CBC: Recent Labs  Lab 01/07/22 1534 01/08/22 0805 01/09/22 0344 01/11/22 0348 01/12/22 0425  WBC 6.4 9.6 7.3 5.8 6.6  NEUTROABS 4.6  --  5.2  --   --   HGB 13.7 13.2 12.1 12.4 12.6  HCT 40.7 38.6 36.2 37.4 38.9  MCV 92.3 92.1 93.1 93.3 94.6  PLT 278 252 238 248 260       Antibiotics: Anti-infectives (From admission, onward)    Start     Dose/Rate Route Frequency Ordered Stop   01/15/22 1400  ceFAZolin (ANCEF) IVPB 2g/100 mL premix        2 g 200 mL/hr over 30 Minutes Intravenous To Radiology 01/13/22 0933 01/16/22 1400   01/13/22 2200  cephALEXin (KEFLEX) capsule 500 mg        500 mg Oral Every 12 hours 01/13/22 1347 01/14/22 0950   01/09/22 1630  cephALEXin (KEFLEX) capsule 500 mg  Status:  Discontinued        500 mg Oral Every 12 hours 01/09/22 1531 01/13/22 1347        DVT prophylaxis: Lovenox  Code Status: Full code  Family Communication: Discussed with patient's daughter at bedside      Objective    Physical Examination:   General-appears in no acute distress Heart-S1-S2, regular, no murmur auscultated Lungs-clear to auscultation bilaterally, no wheezing or crackles auscultated Abdomen-soft,  nontender, no organomegaly Extremities-no edema in the lower extremities Neuro-alert, oriented x3, no focal deficit noted  Status is: Inpatient due to failure to thrive, progressive dysphagia    Pressure Injury 01/07/22 Perineum Right (Active)  01/07/22 2130  Location: Perineum  Location Orientation: Right  Staging:   Wound Description (Comments):   Present on Admission: Yes          [place]   [facility] If 7PM-7AM, please contact night-coverage at [email or web address], Office  [phone]   01/14/2022, 2:23 PM  LOS: 6 days

## 2022-01-14 NOTE — Progress Notes (Signed)
Speech Language Pathology Treatment: Dysphagia  Patient Details Name: Tasha Moss MRN: [id number] DOB: Jun 14, 1936 Today's Date: 01/14/2022 Time: 7510-2585 SLP Time Calculation (min) (ACUTE ONLY): 20 min  Assessment / Plan / Recommendation Clinical Impression  Patient seen by SLP with spouse present in room for skilled treatment focusing on dysphagia goals. Patient was able to verbalize all swallow safety strategies that had been previously recommended. She and spouse report that items that she is not to eat are being sent on her meal tray and that kitchen seems to be out of majority of flavors of prethickened juices/drinks. As patient and spouse both aware of recommendation for only liquids, nectar thick, there is little to no concern of her eating any solids and husband states, "we just throw it out". Patient requested some applejuice which SLP thickened to nectar thick consistency and educated patient and spouse on use of nectar thickener packets. Patient took small straw sips of juice without exhibiting any overt s/s aspiration or penetration. She reported that she enjoys the cold of the juice and also apple juice is one of her favorites. SLP discussed with patient and spouse about using thickener at home and spouse did feel that prethickened drinks would be easier.  Patient is to get PEG tomorrow (2/7). SLP plans to follow up at least one more time to ensure patient tolerating PO's and that education is completed.     HPI HPI: per admitting MD note "Patient was hospitalized back in November with a dislocated left total hip arthroplasty status post reduction and was discharged to SNF.  Also contracted COVID in rehab.  She subsequently developed worsening vertigo with nausea and vomiting thought to be due to bilateral vestibular disorder.  She also has progressive worsening dysphagia and issues with passage of solid food. She was referred to GI and had barium swallow on 1/19  but aspirated contrast.  Now has modified barium swallow planned at [facility] later this week."  Esophagram was unremarkable except for aspiraiton.  MBS ordered in house for pt due to pt's ongoing dysphagia issues. Per interview with pt and pt's daughter, [name], pt has been having dysphagia for approx six months.  Dysphagia coupled with gustatory deficits have caused pt to lose a significant amount of weight - most notably since November.  Pt states food stick in her throat causing her to drink liquids to clear or cough and expectorate.  She has undergone prior esophageal dilatation and daughter reports pt has problems with pills. Pt also with h/o cervical spine surgery and thyroid surgery.      SLP Plan  Continue with current plan of care      Recommendations for follow up therapy are one component of a multi-disciplinary discharge planning process, led by the attending physician.  Recommendations may be updated based on patient status, additional functional criteria and insurance authorization.    Recommendations  Diet recommendations: Nectar-thick liquid;Thin liquid Liquids provided via: Cup;Straw;Teaspoon Medication Administration: Other (Comment) (liquid suspension or with nectar thick liquids) Supervision: Full supervision/cueing for compensatory strategies Compensations: Follow solids with liquid;Small sips/bites;Slow rate Postural Changes and/or Swallow Maneuvers: Seated upright 90 degrees;Upright 30-60 min after meal                Oral Care Recommendations: Oral care BID Follow Up Recommendations: Long-term institutional care without follow-up therapy Assistance recommended at discharge: None SLP Visit Diagnosis: Dysphagia, pharyngoesophageal phase (R13.14);Dysphagia, pharyngeal phase (R13.13) Plan: Continue with current plan of care  Sonia Baller, MA, CCC-SLP Speech Therapy

## 2022-01-15 ENCOUNTER — Other Ambulatory Visit: Payer: Medicare Other

## 2022-01-15 ENCOUNTER — Inpatient Hospital Stay (HOSPITAL_COMMUNITY): Payer: Medicare Other

## 2022-01-15 DIAGNOSIS — R1312 Dysphagia, oropharyngeal phase: Secondary | ICD-10-CM | POA: Diagnosis not present

## 2022-01-15 DIAGNOSIS — R627 Adult failure to thrive: Secondary | ICD-10-CM | POA: Diagnosis not present

## 2022-01-15 DIAGNOSIS — H8113 Benign paroxysmal vertigo, bilateral: Secondary | ICD-10-CM | POA: Diagnosis not present

## 2022-01-15 DIAGNOSIS — E039 Hypothyroidism, unspecified: Secondary | ICD-10-CM | POA: Diagnosis not present

## 2022-01-15 HISTORY — PX: IR GASTROSTOMY TUBE MOD SED: IMG625

## 2022-01-15 LAB — GLUCOSE, CAPILLARY
Glucose-Capillary: 139 mg/dL — ABNORMAL HIGH (ref 70–99)
Glucose-Capillary: 126 mg/dL — ABNORMAL HIGH (ref 70–99)

## 2022-01-15 MED ORDER — PROSOURCE TF PO LIQD
45.0000 mL | Freq: Every day | ORAL | Status: DC
  Filled 2022-01-15 (×2): qty 45

## 2022-01-15 MED ORDER — FENTANYL CITRATE (PF) 100 MCG/2ML IJ SOLN
INTRAMUSCULAR | Status: AC | PRN
  Administered 2022-01-15: 25 ug via INTRAVENOUS

## 2022-01-15 MED ORDER — MIDAZOLAM HCL 2 MG/2ML IJ SOLN
INTRAMUSCULAR | Status: AC | PRN
  Administered 2022-01-15: .5 mg via INTRAVENOUS

## 2022-01-15 MED ORDER — MIDAZOLAM HCL 2 MG/2ML IJ SOLN
INTRAMUSCULAR | Status: AC
  Filled 2022-01-15: qty 2

## 2022-01-15 MED ORDER — OSMOLITE 1.5 CAL PO LIQD
1000.0000 mL | ORAL | Status: DC
  Filled 2022-01-15 (×2): qty 1000

## 2022-01-15 MED ORDER — CEFAZOLIN SODIUM-DEXTROSE 2-4 GM/100ML-% IV SOLN
INTRAVENOUS | Status: AC
  Filled 2022-01-15: qty 100

## 2022-01-15 MED ORDER — LIDOCAINE VISCOUS HCL 2 % MT SOLN
OROMUCOSAL | Status: AC
  Filled 2022-01-15: qty 15

## 2022-01-15 MED ORDER — ONDANSETRON HCL 4 MG/2ML IJ SOLN
INTRAMUSCULAR | Status: AC | PRN
  Administered 2022-01-15: 4 mg via INTRAVENOUS

## 2022-01-15 MED ORDER — ONDANSETRON HCL 4 MG/2ML IJ SOLN
INTRAMUSCULAR | Status: AC
  Filled 2022-01-15: qty 2

## 2022-01-15 MED ORDER — FREE WATER: 100.0000 mL | Status: DC

## 2022-01-15 MED ORDER — FENTANYL CITRATE (PF) 100 MCG/2ML IJ SOLN
INTRAMUSCULAR | Status: AC
  Filled 2022-01-15: qty 2

## 2022-01-15 MED ORDER — THIAMINE HCL 100 MG PO TABS
100.0000 mg | ORAL_TABLET | Freq: Every day | ORAL | Status: DC
  Administered 2022-01-16 – 2022-01-23 (×7): 100 mg via ORAL
  Filled 2022-01-15 (×7): qty 1

## 2022-01-15 MED ORDER — GLUCAGON HCL (RDNA) 1 MG IJ SOLR
INTRAMUSCULAR | Status: AC | PRN
  Administered 2022-01-15: 1 mg via INTRAVENOUS

## 2022-01-15 MED ORDER — PROSOURCE TF PO LIQD: 45.0000 mL | Freq: Every day | ORAL | Status: DC

## 2022-01-15 MED ORDER — VITAL HIGH PROTEIN PO LIQD: 1000.0000 mL | ORAL | Status: DC

## 2022-01-15 MED ORDER — LIDOCAINE HCL 1 % IJ SOLN
INTRAMUSCULAR | Status: AC
  Filled 2022-01-15: qty 20

## 2022-01-15 MED ORDER — GLUCAGON HCL RDNA (DIAGNOSTIC) 1 MG IJ SOLR
INTRAMUSCULAR | Status: AC
  Filled 2022-01-15: qty 1

## 2022-01-15 NOTE — Plan of Care (Signed)
°  Problem: Education: Goal: Ability to demonstrate management of disease process will improve Outcome: Progressing Goal: Ability to verbalize understanding of medication therapies will improve Outcome: Progressing   Problem: Activity: Goal: Capacity to carry out activities will improve Outcome: Progressing   Problem: Cardiac: Goal: Ability to achieve and maintain adequate cardiopulmonary perfusion will improve Outcome: Progressing   Problem: Education: Goal: Knowledge of General Education information will improve Description: Including pain rating scale, medication(s)/side effects and non-pharmacologic comfort measures Outcome: Progressing   Problem: Health Behavior/Discharge Planning: Goal: Ability to manage health-related needs will improve Outcome: Progressing   Problem: Clinical Measurements: Goal: Ability to maintain clinical measurements within normal limits will improve Outcome: Progressing Goal: Will remain free from infection Outcome: Progressing Goal: Diagnostic test results will improve Outcome: Progressing Goal: Respiratory complications will improve Outcome: Progressing Goal: Cardiovascular complication will be avoided Outcome: Progressing   Problem: Activity: Goal: Risk for activity intolerance will decrease Outcome: Progressing   Problem: Coping: Goal: Level of anxiety will decrease Outcome: Progressing   Problem: Elimination: Goal: Will not experience complications related to bowel motility Outcome: Progressing Goal: Will not experience complications related to urinary retention Outcome: Progressing   Problem: Pain Managment: Goal: General experience of comfort will improve Outcome: Progressing   Problem: Safety: Goal: Ability to remain free from injury will improve Outcome: Progressing   Problem: Skin Integrity: Goal: Risk for impaired skin integrity will decrease Outcome: Progressing

## 2022-01-15 NOTE — Progress Notes (Signed)
Physical Therapy Treatment Patient Details Name: Tasha Moss MRN: [id number] DOB: [birth year] Today's Date: 01/15/2022   History of Present Illness Patient is 86 y.o. female presented to [facility] for progressive weakness, dizziness, vertigo, and N/V. patient recently admitted for Lt hip dislocation in November 2022 and discharge to SNF where she caught COVID and ifrst developed vertigo symptoms. PMH signifnicant for HLD, HTN, BPPV, hypothyroidism, thyroidectomy, GERD, anxiety, OA, Lt TSA, bil THA in 1990's.    PT Comments    General Comments: AxO x 3 very HOH.  Sweet.  Motivated. Already OOB and in recliner.  First assisted to bathroom.  Pt c/o that she has NOT had a BM "for a while".  Then assisted with amb in hallway.  General Gait Details: cues for trunk extension and proximity to RW as pt has tendency to move walker too far ahead of self. intermittent assist needed for walker and cues to guide direction to negotiate obstacles in hallway.  Returned to room and positioned in recliner to comfort.  Remove tele box from pt's pocket (pulling on her neck) and applied 2 warm packs to her cervical neck.   Pt plans to D/C back home with spouse and family assist after PEG placement.    Recommendations for follow up therapy are one component of a multi-disciplinary discharge planning process, led by the attending physician.  Recommendations may be updated based on patient status, additional functional criteria and insurance authorization.  Follow Up Recommendations  Home health PT     Assistance Recommended at Discharge Frequent or constant Supervision/Assistance  Patient can return home with the following A little help with walking and/or transfers;A little help with bathing/dressing/bathroom;Assistance with cooking/housework;Direct supervision/assist for medications management;Assist for transportation;Help with stairs or ramp for entrance   Equipment Recommendations  None recommended by PT     Recommendations for Other Services       Precautions / Restrictions Precautions Precautions: Fall     Mobility  Bed Mobility               General bed mobility comments: OOB in recliner    Transfers Overall transfer level: Needs assistance Equipment used: Rolling walker (2 wheels) Transfers: Sit to/from Stand Sit to Stand: Min guard, Min assist           General transfer comment: Min assist to min/guard with cues for hand placement to power up from recliner and to control descent plus safety with turns.  Also assisted with a toilet transfer.    Ambulation/Gait Ambulation/Gait assistance: Min guard, Min assist Gait Distance (Feet): 43 Feet Assistive device: Rolling walker (2 wheels) Gait Pattern/deviations: Step-through pattern, Decreased stride length, Trunk flexed Gait velocity: decr     General Gait Details: cues for trunk extension and proximity to RW as pt has tendency to move walker too far ahead of self. intermittent assist needed for walker and cues to guide direction to negotiate obstacles in hallway   Stairs             Wheelchair Mobility    Modified Rankin (Stroke Patients Only)       Balance                                            Cognition   Behavior During Therapy: [facility] for tasks assessed/performed Overall Cognitive Status: Within Functional Limits for tasks assessed  General Comments: AxO x 3 very HOH.  Sweet.  Motivated.        Exercises      General Comments        Pertinent Vitals/Pain Pain Assessment Pain Assessment: Faces Faces Pain Scale: Hurts a little bit Pain Location: neck Pain Descriptors / Indicators: Discomfort, Sore Pain Intervention(s): Monitored during session, Heat applied, Repositioned    Home Living                          Prior Function            PT Goals (current goals can now be found in the care plan  section) Progress towards PT goals: Progressing toward goals    Frequency    Min 3X/week      PT Plan Current plan remains appropriate    Co-evaluation              AM-PAC PT "6 Clicks" Mobility   Outcome Measure  Help needed turning from your back to your side while in a flat bed without using bedrails?: A Little Help needed moving from lying on your back to sitting on the side of a flat bed without using bedrails?: A Little Help needed moving to and from a bed to a chair (including a wheelchair)?: A Little Help needed standing up from a chair using your arms (e.g., wheelchair or bedside chair)?: A Little Help needed to walk in hospital room?: A Little Help needed climbing 3-5 steps with a railing? : A Little 6 Click Score: 18    End of Session Equipment Utilized During Treatment: Gait belt Activity Tolerance: Patient tolerated treatment well Patient left: in chair;with call bell/phone within reach;with chair alarm set;with family/visitor present Nurse Communication: Mobility status PT Visit Diagnosis: Unsteadiness on feet (R26.81);Other abnormalities of gait and mobility (R26.89);Muscle weakness (generalized) (M62.81);Difficulty in walking, not elsewhere classified (R26.2)     Time: 8115-7262 PT Time Calculation (min) (ACUTE ONLY): 25 min  Charges:  $Gait Training: 8-22 mins $Therapeutic Activity: 8-22 mins                     {Cotey Rakes  PTA Acute  Rehabilitation Services Pager      [phone] Office      [phone]

## 2022-01-15 NOTE — Progress Notes (Signed)
[facility]  PROGRESS NOTE  Tasha Moss UYQ:[id number] DOB: 08-Feb-1936 DOA: 01/07/2022 PCP: [name], MD   Brief HPI:   86 year old female with history of BPPV, hypertension, hyperlipidemia, hypothyroidism s/p partial thyroidectomy, GERD, esophageal stricture presented with generalized weakness.  She was admitted in November with left hip dislocation s/p reduction and discharged to skilled nursing facility.  She got COVID in rehab and subsequently developed vertigo with nausea and vomiting.  She also had progressive dysphagia with issue of passage of solid food.  She underwent barium swallow per GI on 12/27/2021.  She aspirated into trachea and plan was for MBS.  She continued to have dizziness and vertigo with nausea vomiting and also developed progressive weakness.  Patient was admitted  with failure to thrive.    Subjective   Patient seen and examined, denies any new complaints.  Awaiting PEG tube placement for today   Assessment/Plan:     Failure to thrive -Secondary to oropharyngeal dysphagia -Patient and family have decided to go with PEG tube placement -IR consulted for gastrostomy tube, scheduled for today   Dysphagia -Followed by [facility] as outpatient -Underwent upper GI series as outpatient on 12/27/2021 with aspiration of contrast into trachea at level of mainstem bronchi -Speech therapy was consulted during this hospital admission, MBS showed pharyngeal esophageal and pharyngeal phase dysphagia-chronic dysphagia thought due to position of cervical spine with exacerbation related to her current illness/deconditioning -Speech therapy recommended full liquid, nectar thick diet -GI consulted, EGD was done yesterday; EGD was unremarkable, also esophagus was dilated -No significant improvement after EGD -Family decided for PEG tube placement, IR consulted for gastrostomy tube placement today -Continue PPI  Benign paroxysmal positional vertigo -Complains of  dizziness this morning, refusing to take meclizine. -Patient is already on scopolamine patch, will avoid adding Dramamine to avoid anticholinergic side effects -Head CT on 01/07/2022 showed no acute abnormality. -PT/OT, vestibular  rehab  Chronic diastolic CHF -Does not appear to be volume overloaded -She received Lasix x1 on 01/08/2022 -Chest x-ray obtained today shows no worsening of CHF -Echocardiogram obtained today shows EF 65% with grade 1 diastolic dysfunction, also shows mild to moderate mitral regurgitation  Bilateral buttock cellulitis -Significantly improved after starting  Keflex 500 mg p.o. twice daily -Completed 5 days of treatment today  Oral thrush -Cleared after starting  nystatin 5 ml 4 times daily for total 7 days  Hypothyroidism -Continue Synthroid 125 mcg daily -TSH 6.5, mildly elevated likely from sick euthyroid syndrome -Repeat TSH in 4 to 6 weeks as outpatient  Sacral ulcer -Change mattress to air overlay mattress  Hyponatremia -Likely from poor p.o. intake/dehydration and also excessive free water intake as patient is on full liquid diet only -Improved, today sodium is 133 -Serum osmolality 275 -We will avoid giving IV fluids due to underlying history of CHF       Medications     cholecalciferol  1,000 Units Oral Daily   enoxaparin (LOVENOX) injection  40 mg Subcutaneous Q24H   feeding supplement  237 mL Oral TID BM   FLUoxetine  20 mg Oral Daily   furosemide  40 mg Intravenous Once   levothyroxine  125 mcg Oral QAC breakfast   loratadine  10 mg Oral Daily   multivitamin with minerals  1 tablet Oral Daily   nystatin  5 mL Mouth/Throat QID   ondansetron (ZOFRAN) IV  4 mg Intravenous Once   pantoprazole  40 mg Oral Daily   polyethylene glycol  17 g  Oral Daily   polyvinyl alcohol  2 drop Both Eyes Daily   scopolamine  1 patch Transdermal Q72H   vitamin B-12  250 mcg Oral Daily     Data Reviewed:   CBG:  No results for input(s): GLUCAP  in the last 168 hours.  SpO2: 96 % O2 Flow Rate (L/min): 2 L/min    Vitals:   01/14/22 2148 01/15/22 0500 01/15/22 0642 01/15/22 1015  BP: 113/70  134/67 (P) 133/70  Pulse: 92  85 (P) 95  Resp: 20  18   Temp: 97.9 F (36.6 C)  97.9 F (36.6 C) (P) 97.8 F (36.6 C)  TempSrc: Oral  Oral (P) Oral  SpO2: 94%  96%   Weight:  61.2 kg    Height:          Data Reviewed:  Basic Metabolic Panel: Recent Labs  Lab 01/09/22 0344 01/11/22 0348 01/12/22 0425 01/14/22 0334  NA 132* 131* 127* 133*  K 3.6 3.6 3.5 3.8  CL 93* 92* 88* 93*  CO2 32 32 32 34*  GLUCOSE 114* 110* 108* 117*  BUN 13 10 7* 10  CREATININE 0.59 0.45 0.44 0.51  CALCIUM 8.8* 8.6* 8.3* 8.7*  MG 1.7  --   --   --   PHOS 4.6  --   --   --     CBC: Recent Labs  Lab 01/09/22 0344 01/11/22 0348 01/12/22 0425  WBC 7.3 5.8 6.6  NEUTROABS 5.2  --   --   HGB 12.1 12.4 12.6  HCT 36.2 37.4 38.9  MCV 93.1 93.3 94.6  PLT 238 248 260       Antibiotics: Anti-infectives (From admission, onward)    Start     Dose/Rate Route Frequency Ordered Stop   01/15/22 1400  ceFAZolin (ANCEF) IVPB 2g/100 mL premix        2 g 200 mL/hr over 30 Minutes Intravenous To Radiology 01/13/22 0933 01/16/22 1400   01/13/22 2200  cephALEXin (KEFLEX) capsule 500 mg        500 mg Oral Every 12 hours 01/13/22 1347 01/14/22 0950   01/09/22 1630  cephALEXin (KEFLEX) capsule 500 mg  Status:  Discontinued        500 mg Oral Every 12 hours 01/09/22 1531 01/13/22 1347        DVT prophylaxis: Lovenox  Code Status: Full code  Family Communication: Discussed with patient's daughter at bedside      Objective    Physical Examination:   General-appears in no acute distress Heart-S1-S2, regular, no murmur auscultated Lungs-clear to auscultation bilaterally, no wheezing or crackles auscultated Abdomen-soft, nontender, no organomegaly Extremities-no edema in the lower extremities Neuro-alert, oriented x3, no focal deficit  noted  Status is: Inpatient due to failure to thrive, progressive dysphagia    Pressure Injury 01/07/22 Perineum Right (Active)  01/07/22 2130  Location: Perineum  Location Orientation: Right  Staging:   Wound Description (Comments):   Present on Admission: Yes       [place]   [facility] If 7PM-7AM, please contact night-coverage at [email or web address], Office  [phone]   01/15/2022, 12:00 PM  LOS: 7 days

## 2022-01-15 NOTE — Progress Notes (Signed)
Nutrition Follow-up  DOCUMENTATION CODES:   Non-severe (moderate) malnutrition in context of chronic illness  INTERVENTION:  - will start patient on continuous TF to monitor tolerance and better mitigate risk of refeeding. - will transition to bolus regimen once tolerance confirmed and risk of refeeding decreases.   - will order Osmolite 1.5 to start at 20 ml/hr on 2/8 at 1500 and advance by 10 ml every 24 hours to reach goal rate of 20 ml/hr with 45 ml Prosource TF once/day and 100 ml free water every 4 hours.  - at goal rate, this regimen will provide 1480 (84% kcal need), 71 grams protein (83% protein need), and 1331 ml free water.  - re-advance diet to FLD, nectar-thick following PEG placement. - will order 100 mg thiamine/day d/t risk of refeeding.  Monitor magnesium, potassium, and phosphorus BID for at least 3 days, MD to replete as needed, as pt is at risk for refeeding syndrome given moderate malnutrition, prolonged period without adequate nutrition.   NUTRITION DIAGNOSIS:   Moderate Malnutrition related to chronic illness, dysphagia as evidenced by moderate fat depletion, moderate muscle depletion. -ongoing  GOAL:   Patient will meet greater than or equal to 90% of their needs -to be met once TF regimen at goal  MONITOR:   TF tolerance, PO intake, Supplement acceptance, Labs, Weight trends, Skin  REASON FOR ASSESSMENT:   Consult Enteral/tube feeding initiation and management  ASSESSMENT:   86 y.o. female with medical history of BPPV, HTN, HLD, hypothyroidism s/p partial thyroidectomy, GERD, and esophageal stricture. She presented to the ED due to weakness with inability to get up unassisted and progressive dysphagia. She was hospitalized in 10/2021 and underwent L total hip arthroplasty and was discharged to SNF/rehab where she contracted COVID at the beginning of 11/2021. She developed worsening vertigo and N/V with concern for cause being bilateral vestibular  disorder. She had barium swallow evaluation on 12/27/21 during which she aspirated contrast. She continues to have dizziness 2/2 vertigo, N/V, and decreased intake of food and liquids.  Patient sitting in the chair with husband at bedside. Patient shares that she is nervous about PEG placement. Provided compassionate understanding and reassurance to patient.  SLP last saw patient yesterday afternoon and SLP discussed findings and recommendations to patient and husband.  She was on FLD, nectar-thick liquids 1/31 evening-midnight on 2/3 and then re-advanced to FLD, nectar-thick liquids on 2/3 at 1515.  The only documented meal intake was 90% of dinner last night. Review of [facility] indicates that patient received a breakfast tray yesterday but skipped lunch and dinner. From discussion with patient and husband, it sounds like liquids may have been provided from the floor stock and thickened with thicken up packets that are on bedside table.   Patient had been cleared by SLP last week to have Ensure without thicken up. Patient shares she had been drinking it from the bottle, taking small sips. Yesterday evening thicken up was added to Ensure and husband shares that he feels patient tolerated drinking supplement better with it being thickened.   Dx of oral thrush in MD note and patient ordered mycostatin QID. Patient denies oral or esophageal pain. She does share being told she may have acid reflux.   She underwent upper endoscopy 2/3 and was unremarkable beyond 2 cm hiatal hernia noted. Esophagus dilated during EGD. Patient unsure if swallowing ability changed following this, husband feels there has been very minimal improvement.  Discussed plan concerning TF start 2/8.   Weight  mainly stable throughout hospitalizations but did not fluctuation nearly daily. No information documented in the edema section of flow sheet this hospitalization. She is noted to be +164 ml since admission.   Labs  reviewed; Na: 133 mmol/l, Cl: 93 mmol/l, Ca: 8.7 mg/dl.  Medications reviewed; 1000 units cholecalciferol/day, 125 mg oral synthroid/day, tablet multivitamin with minerals/day, 40 mg oral protonix/day, 17 g miralax/day, 250 mcg oral cyanocobalamin/day, 5 ml mycostatin QID.    Diet Order:   Diet Order             Diet NPO time specified Except for: Sips with Meds  Diet effective midnight                   EDUCATION NEEDS:   Education needs have been addressed  Skin:  Skin Assessment: Skin Integrity Issues: Skin Integrity Issues:: Stage II Stage II: sacrum (per MD note on 1/31)  Last BM:  2/3  Height:   Ht Readings from Last 1 Encounters:  01/07/22 _0  (1.651 m)    Weight:   Wt Readings from Last 1 Encounters:  01/15/22 61.2 kg     BMI:  Body mass index is 22.47 kg/m.   Estimated Nutritional Needs:  Kcal:  1750-1950 kcal Protein:  85-95 grams Fluid:  >/= 1.9 L/day     Jarome Matin, MS, RD, LDN Inpatient Clinical Dietitian RD pager # available in [place]  After hours/weekend pager # available in [facility]

## 2022-01-15 NOTE — Progress Notes (Addendum)
End of shift  Pt A&Ox4, husband present, pt npo, went to IR for peg tube placement but unable to place in IR.  Will have to have surgical placement.   Nectar thick liquid diet in place.

## 2022-01-16 DIAGNOSIS — R6339 Other feeding difficulties: Secondary | ICD-10-CM

## 2022-01-16 DIAGNOSIS — H8110 Benign paroxysmal vertigo, unspecified ear: Secondary | ICD-10-CM

## 2022-01-16 LAB — COMPREHENSIVE METABOLIC PANEL
ALT: 17 U/L (ref 0–44)
AST: 19 U/L (ref 15–41)
Albumin: 2.8 g/dL — ABNORMAL LOW (ref 3.5–5.0)
Alkaline Phosphatase: 62 U/L (ref 38–126)
Anion gap: 7 (ref 5–15)
BUN: 12 mg/dL (ref 8–23)
CO2: 31 mmol/L (ref 22–32)
Calcium: 8.7 mg/dL — ABNORMAL LOW (ref 8.9–10.3)
Chloride: 95 mmol/L — ABNORMAL LOW (ref 98–111)
Creatinine, Ser: 0.53 mg/dL (ref 0.44–1.00)
GFR, Estimated: >60 mL/min (ref >60)
Glucose, Bld: 100 mg/dL — ABNORMAL HIGH (ref 70–99)
Potassium: 3.9 mmol/L (ref 3.5–5.1)
Sodium: 133 mmol/L — ABNORMAL LOW (ref 135–145)
Total Bilirubin: 0.4 mg/dL (ref 0.3–1.2)
Total Protein: 5.5 g/dL — ABNORMAL LOW (ref 6.5–8.1)

## 2022-01-16 LAB — CBC
HCT: 37.3 % (ref 36.0–46.0)
Hemoglobin: 12.4 g/dL (ref 12.0–15.0)
MCH: 31.5 pg (ref 26.0–34.0)
MCHC: 33.2 g/dL (ref 30.0–36.0)
MCV: 94.7 fL (ref 80.0–100.0)
Platelets: 253 10*3/uL (ref 150–400)
RBC: 3.94 MIL/uL (ref 3.87–5.11)
RDW: 14.7 % (ref 11.5–15.5)
WBC: 5.8 10*3/uL (ref 4.0–10.5)
nRBC: 0 % (ref 0.0–0.2)

## 2022-01-16 LAB — PHOSPHORUS
Phosphorus: 4.7 mg/dL — ABNORMAL HIGH (ref 2.5–4.6)
Phosphorus: 3.3 mg/dL (ref 2.5–4.6)

## 2022-01-16 LAB — GLUCOSE, CAPILLARY
Glucose-Capillary: 100 mg/dL — ABNORMAL HIGH (ref 70–99)
Glucose-Capillary: 120 mg/dL — ABNORMAL HIGH (ref 70–99)
Glucose-Capillary: 123 mg/dL — ABNORMAL HIGH (ref 70–99)
Glucose-Capillary: 128 mg/dL — ABNORMAL HIGH (ref 70–99)
Glucose-Capillary: 130 mg/dL — ABNORMAL HIGH (ref 70–99)
Glucose-Capillary: 148 mg/dL — ABNORMAL HIGH (ref 70–99)
Glucose-Capillary: 93 mg/dL (ref 70–99)

## 2022-01-16 LAB — MAGNESIUM
Magnesium: 1.6 mg/dL — ABNORMAL LOW (ref 1.7–2.4)
Magnesium: 1.8 mg/dL (ref 1.7–2.4)

## 2022-01-16 MED ORDER — PANTOPRAZOLE SODIUM 40 MG PO TBEC
40.0000 mg | DELAYED_RELEASE_TABLET | Freq: Two times a day (BID) | ORAL | Status: DC
  Administered 2022-01-16 – 2022-01-23 (×13): 40 mg via ORAL
  Filled 2022-01-16 (×13): qty 1

## 2022-01-16 MED ORDER — CEFAZOLIN SODIUM-DEXTROSE 2-4 GM/100ML-% IV SOLN
2.0000 g | Freq: Once | INTRAVENOUS | Status: AC
  Administered 2022-01-17: 2 g via INTRAVENOUS
  Filled 2022-01-16: qty 100

## 2022-01-16 NOTE — Consult Note (Signed)
Consult Note  Tasha Moss 05-Jan-1936  [id number].    Requesting MD: Estill Cotta, MD Chief Complaint/Reason for Consult: dysphagia secondary to esophageal stricture HPI:  Patient is an 86 year old female who was admitted to [facility] 01/07/22 with failure to thrive. Patient was hospitalized in November 2022 s/p reduction of dislocated left total hip arthroplasty and then discharged to SNF. She contracted COVID while in rehab and developed vertigo with nausea and vomiting. Emesis initially thought to be vestibular in nature. She has also had progressive worsening of chronic dysphagia secondary to esophageal stricture. She had a barium swallow 12/27/21 which showed aspiration. SLP evaluated with MBS 1/31 which confirmed significant aspiration risk. GI was consulted and patient underwent EGD 2/3 with attempt to dilate esophageal stricture. No improvement in swallowing function s/p dilation. She had had a previous dilation back in 2011 and had done well after that. Family reports dysphagia has really worsened over the last 6 months with weight loss. Palliative care was consulted to discuss [place] and patient was clear that she and family wish to proceed with gastrostomy placement to supplement nutrition. IR consulted for PEG placement but were unable to secondary to patient anatomy. General surgery consulted for open gastrostomy placement.   PMH otherwise significant for hard of hearing, HTN, HLD, GERD, BPPV, Hypothyroidism s/p partial thyroidectomy , Chronic diastolic CHF and Sacral ulcer. No prior abdominal surgery. Her daughter, [name], was at bedside and assisted with patient history.   ROS: Review of Systems  Constitutional:  Positive for weight loss. Negative for chills and fever.  Respiratory:  Negative for shortness of breath and wheezing.   Cardiovascular:  Negative for chest pain and palpitations.  Gastrointestinal:  Negative for abdominal pain, nausea and vomiting.  Neurological:  Positive  for weakness (generalized).  All other systems reviewed and are negative.  Family History  Problem Relation Age of Onset   Diabetes Mother    Hepatitis Mother        Hepatitis B,? etiology   Miscarriages / Stillbirths Mother    Stroke Mother 4   Heart disease Father        CHF   Cancer Paternal Aunt        GI cancers   Heart disease Paternal Aunt        CHF   Cancer Paternal Uncle        lung, GI   Heart disease Paternal Uncle        CHF    Past Medical History:  Diagnosis Date   Allergy    seasonal & dust   Anxiety    Arthritis    DJD   Family history of adverse reaction to anesthesia    n/v    GERD (gastroesophageal reflux disease)    History of vertigo    Hyperlipidemia    Hypertension    Hypothyroidism    PONV (postoperative nausea and vomiting)    Thyroid disease    hypothyroidism post Hashimoto's & partial thyroidectomy    Past Surgical History:  Procedure Laterality Date   BIOPSY  01/11/2022   Procedure: BIOPSY;  Surgeon: Rush Landmark, Telford Nab., MD;  Location: [facility];  Service: Gastroenterology;;   CATARACT EXTRACTION W/ INTRAOCULAR LENS  IMPLANT, BILATERAL     COLONOSCOPY     diverticulosis   ESOPHAGOGASTRODUODENOSCOPY (EGD) WITH PROPOFOL N/A 01/11/2022   Procedure: ESOPHAGOGASTRODUODENOSCOPY (EGD) WITH PROPOFOL;  Surgeon: Irving Copas., MD;  Location: [facility];  Service: Gastroenterology;  Laterality: N/A;  IR GASTROSTOMY TUBE MOD SED  01/15/2022   JOINT REPLACEMENT  1995 & 1999   THR bilaterally    lumbar disc repair  1989   Dr Arrie Aran   SAVORY DILATION N/A 01/11/2022   Procedure: SAVORY DILATION;  Surgeon: Irving Copas., MD;  Location: [facility];  Service: Gastroenterology;  Laterality: N/A;   SPINE SURGERY     cervical ; Dr Vertell Limber   THYROIDECTOMY, PARTIAL     benign nodule   TONSILLECTOMY AND ADENOIDECTOMY     TOTAL SHOULDER ARTHROPLASTY Left 02/27/2016   Procedure: LEFT REVERSE TOTAL SHOULDER ARTHROPLASTY;   Surgeon: Renette Butters, MD;  Location: [facility];  Service: Orthopedics;  Laterality: Left;   vitreous detachment     Dr Claudean Kinds    Social History:  reports that she has never smoked. She has never used smokeless tobacco. She reports that she does not drink alcohol and does not use drugs.  Allergies:  Allergies  Allergen Reactions   Gabapentin     hallucinations   Other     Strong pain medications cause n/v   Morphine     Nausea & vomiting    Medications Prior to Admission  Medication Sig Dispense Refill   acetaminophen (TYLENOL) 500 MG tablet Take 500 mg by mouth every 6 (six) hours as needed for moderate pain.     Cholecalciferol (VITAMIN D3) 1000 UNITS CAPS Take 1,000 Units by mouth daily.     Cranberry 200 MG CAPS Take 1 capsule by mouth daily.     diclofenac Sodium (VOLTAREN) 1 % GEL Apply 2 g topically 4 (four) times daily as needed (Pain). 50 g 0   dimenhyDRINATE (DRAMAMINE) 50 MG tablet Take 50 mg by mouth every 6 (six) hours as needed for dizziness.     FLUoxetine (PROZAC) 20 MG capsule Take 1 capsule (20 mg total) by mouth daily. 30 capsule 0   fluticasone (FLONASE) 50 MCG/ACT nasal spray Place 1 spray into both nostrils daily.     levothyroxine (SYNTHROID) 125 MCG tablet Take 1 tablet (125 mcg total) by mouth daily before breakfast. 30 tablet 0   loratadine (CLARITIN) 10 MG tablet Take 10 mg by mouth daily.     meloxicam (MOBIC) 7.5 MG tablet Take 1 tablet (7.5 mg total) by mouth 2 (two) times daily. 60 tablet 0   Multiple Vitamin (MULTIVITAMIN PO) Take 400 mg by mouth daily.     Nutritional Supplements (ENSURE ENLIVE PO) Take 237 mLs by mouth 2 (two) times daily.     ondansetron (ZOFRAN) 8 MG tablet Take 1 tablet (8 mg total) by mouth 3 (three) times daily before meals. (Patient taking differently: Take 8 mg by mouth every 8 (eight) hours as needed for vomiting or nausea.) 90 tablet 0   pantoprazole (PROTONIX) 40 MG tablet Take 1 tablet (40 mg total) by mouth daily. 30  tablet 0   Polyvinyl Alcohol-Povidone (ARTIFICIAL TEARS) 5-6 MG/ML SOLN Place 2 drops into both eyes daily.     scopolamine (TRANSDERM-SCOP) 1 MG/3DAYS Place 1 patch (1.5 mg total) onto the skin every 3 (three) days. 10 patch 12   vitamin B-12 (CYANOCOBALAMIN) 250 MCG tablet Take 250 mcg by mouth daily.     polyethylene glycol powder (GLYCOLAX/MIRALAX) 17 GM/SCOOP powder Take 17 g by mouth 2 (two) times daily as needed. (Patient not taking: Reported on 12/20/2021) 3350 g 1    Blood pressure 115/66, pulse 87, temperature 97.7 F (36.5 C), temperature source Oral, resp. rate 18, height  5\' 5"  (1.651 m), weight 55.3 kg, SpO2 92 %. Physical Exam:  General: pleasant, WD, elderly female who is sitting up in chair in NAD HEENT: head is normocephalic, atraumatic.  Sclera are noninjected.  Pupils equal and round.  Ears and nose without any masses or lesions.  Mouth is pink and moist Heart: regular, rate, and rhythm. Palpable radial and pedal pulses bilaterally Lungs: Respiratory effort non-labored on room air, no audible wheezing Abd: soft, NT, ND, no masses, hernias, or organomegaly, no surgical scars noted MS: all 4 extremities are symmetrical with no cyanosis, clubbing, or edema. Skin: warm and dry with no masses, lesions, or rashes Neuro: Cranial nerves 2-12 grossly intact, sensation is normal throughout Psych: A&Ox3 with an appropriate affect.   Results for orders placed or performed during the hospital encounter of 01/07/22 (from the past 48 hour(s))  Glucose, capillary     Status: Abnormal   Collection Time: 01/15/22  4:49 PM  Result Value Ref Range   Glucose-Capillary 139 (H) 70 - 99 mg/dL    Comment: Glucose reference range applies only to samples taken after fasting for at least 8 hours.  Glucose, capillary     Status: Abnormal   Collection Time: 01/15/22  8:23 PM  Result Value Ref Range   Glucose-Capillary 126 (H) 70 - 99 mg/dL    Comment: Glucose reference range applies only to  samples taken after fasting for at least 8 hours.  Glucose, capillary     Status: Abnormal   Collection Time: 01/16/22 12:09 AM  Result Value Ref Range   Glucose-Capillary 120 (H) 70 - 99 mg/dL    Comment: Glucose reference range applies only to samples taken after fasting for at least 8 hours.  CBC     Status: None   Collection Time: 01/16/22  3:52 AM  Result Value Ref Range   WBC 5.8 4.0 - 10.5 K/uL   RBC 3.94 3.87 - 5.11 MIL/uL   Hemoglobin 12.4 12.0 - 15.0 g/dL   HCT 37.3 36.0 - 46.0 %   MCV 94.7 80.0 - 100.0 fL   MCH 31.5 26.0 - 34.0 pg   MCHC 33.2 30.0 - 36.0 g/dL   RDW 14.7 11.5 - 15.5 %   Platelets 253 150 - 400 K/uL   nRBC 0.0 0.0 - 0.2 %    Comment: Performed at [facility], [place] [address], [place], [place] [address]  Comprehensive metabolic panel     Status: Abnormal   Collection Time: 01/16/22  3:52 AM  Result Value Ref Range   Sodium 133 (L) 135 - 145 mmol/L   Potassium 3.9 3.5 - 5.1 mmol/L   Chloride 95 (L) 98 - 111 mmol/L   CO2 31 22 - 32 mmol/L   Glucose, Bld 100 (H) 70 - 99 mg/dL    Comment: Glucose reference range applies only to samples taken after fasting for at least 8 hours.   BUN 12 8 - 23 mg/dL   Creatinine, Ser 0.53 0.44 - 1.00 mg/dL   Calcium 8.7 (L) 8.9 - 10.3 mg/dL   Total Protein 5.5 (L) 6.5 - 8.1 g/dL   Albumin 2.8 (L) 3.5 - 5.0 g/dL   AST 19 15 - 41 U/L   ALT 17 0 - 44 U/L   Alkaline Phosphatase 62 38 - 126 U/L   Total Bilirubin 0.4 0.3 - 1.2 mg/dL   GFR, Estimated >60 >60 mL/min    Comment: (NOTE) Calculated using the CKD-EPI Creatinine Equation (2021)  Anion gap 7 5 - 15    Comment: Performed at [facility], [place] [address], [place], [place] [address]  Magnesium     Status: Abnormal   Collection Time: 01/16/22  3:52 AM  Result Value Ref Range   Magnesium 1.6 (L) 1.7 - 2.4 mg/dL    Comment: Performed at [facility], [place] [address], [place], [place] [address]  Phosphorus      Status: Abnormal   Collection Time: 01/16/22  3:52 AM  Result Value Ref Range   Phosphorus 4.7 (H) 2.5 - 4.6 mg/dL    Comment: Performed at [facility], [place] [address], [place], [place] [address]  Glucose, capillary     Status: Abnormal   Collection Time: 01/16/22  4:18 AM  Result Value Ref Range   Glucose-Capillary 100 (H) 70 - 99 mg/dL    Comment: Glucose reference range applies only to samples taken after fasting for at least 8 hours.  Glucose, capillary     Status: None   Collection Time: 01/16/22  7:38 AM  Result Value Ref Range   Glucose-Capillary 93 70 - 99 mg/dL    Comment: Glucose reference range applies only to samples taken after fasting for at least 8 hours.   IR GASTROSTOMY TUBE MOD SED  Result Date: 01/15/2022 INDICATION: 86 year old woman with dysphagia esophageal stricture presents to IR for gastrostomy tube placement. EXAM: Attempted percutaneous G-tube placement MEDICATIONS: Glucagon 1 mg IV ANESTHESIA/SEDATION: Moderate (conscious) sedation was employed during this procedure. A total of Versed 0.5 mg and Fentanyl 25 mcg was administered intravenously by the radiology nurse. Total intra-service moderate Sedation Time: 7 minutes. The patient's level of consciousness and vital signs were monitored continuously by radiology nursing throughout the procedure under my direct supervision. CONTRAST:  0-administered into the gastric lumen. FLUOROSCOPY: Radiation Exposure Index (as provided by the fluoroscopic device): 14 mGy Kerma COMPLICATIONS: None immediate. PROCEDURE: Informed written consent was obtained from the patient after a thorough discussion of the procedural risks, benefits and alternatives. All questions were addressed. Maximal Sterile Barrier Technique was utilized including caps, mask, sterile gowns, sterile gloves, sterile drape, hand hygiene and skin antiseptic. A timeout was performed prior to the initiation of the procedure. Scout image demonstrated  mildly dilated colon filled with air and oral contrast. Nasogastric tube was inserted with tip position at the level of the stomach. The stomach was inflated, however the colon remained super imposed on the anterior stomach. No safe window could be identified. The procedure was terminated. IMPRESSION: Unable to identify safe percutaneous window for G-tube placement due to superimposed colon. Electronically Signed   By: Miachel Roux M.D.   On: 01/15/2022 16:44      Assessment/Plan Esophageal stricture Severe protein calorie malnutrition  Generalized weakness/FTT - followed by [facility], s/p EGD 2/3 with dilation - dysphagia was not improved s/p dilation - IR was consulted for PEG placement but colon was superimposed over stomach - palliative has seen as well and patient and family wish to proceed with feeding tube placement - overall goal is to be able to get patient home to be with family  - will plan to proceed with open gastrostomy placement tomorrow   FEN: nectar thick liquids - NPO after MN VTE: LMWH ID: no current abx - Ancef ordered pre-op  - below per TRH -  HTN HLD GERD BPPV Hypothyroidism s/p partial thyroidectomy  Chronic diastolic CHF Sacral ulcer   I reviewed Consultant GI and palliative care notes,  hospitalist notes, last 24 h vitals and pain scores, last 48 h intake and output, last 24 h labs and trends, and last 24 h imaging results.  This care required moderate level of medical decision making.   Norm Parcel, [facility] 01/16/2022, 10:36 AM Please see Amion for pager number during day hours 7:00am-4:30pm

## 2022-01-16 NOTE — Progress Notes (Addendum)
[facility] Progress Note GI Re-Consult Note    Interval History: Tasha Moss. Tasha Moss is an 86 year old female with a past medical history of anxiety, arthritis, hypertension, hyperlipidemia, hypothyroidism s/p partial thyroidectomy, vertigo, Covid 19 infection 11/2021, anemia, GERD and an esophageal stricture.  Past cervical spine surgery.  She was admitted  was admitted to the hospital 01/07/2022 with CHF, dysphagia, weight loss with associated failure to thrive. A modified barium swallow study done with speech pathologist 01/08/2022 showed severe pharyngocervical dysphagia with both sensorimotor and obstructive dysphagia resulting in gross retention of barium throughout pharynx and aspiration of thin liquids.  She was seen by our GI hospital service 01/09/2021 and she underwent an EGD 01/11/2022 by Dr. Rush Landmark which showed a mild mucosal wrent at the UES otherwise no gross lesions in her esophagus, a 2 cm hiatal hernia, erythematous mucosa present in the stomach. Biopsies showed esophageal eosinophilia with recommendations to treat with PPI bid. No evidence of H. Pylori. No contraindication for PEG placement is identified.  G-tube placement by IR was attempted on 01/15/2022 but was unsuccessful, they were unable to identify a safe percutaneous window for G-tube placement due to a superimposed colon.  A repeat GI consult was requested prior to pursuing surgical placement of the PEG tube.  Currently, she is sitting up in her chair feeling quite well.  She is tolerating thick liquids with intermittent throat clearing with some less frequent coughing after swallowing.  No nausea or vomiting.  No upper or lower abdominal pain.  Her last bowel movement was documented 5 days ago.  No chest pain or shortness of breath.  Her daughter who is a Equities trader at [employer] is present.  The patient and her family wish to proceed with surgical placement of a PEG tube as soon as  possible so she may return home soon.   IMAGE STUDIES:   Abdominal/pelvic CT 01/07/2022: 1. Evidence of CHF. Cardiomegaly, pulmonary vascular prominence, trace pleural effusions, interstitial edema and possibly early alveolar edema. 2. No acute process identified in the abdomen or pelvis. 3. Colonic diverticulosis. 4. Other chronic findings as described.  Upper GI series 12/27/2021: Aspiration of contrast into trachea at the level of the mainstem bronchi; consider referral to speech therapy for swallowing function study evaluation.  Duodenal diverticula incidentally noted.  No other significant abnormalities are seen; the 12.5 mm diameter barium tablet was not administered due to aspiration; this could be administered at time of speech swallowing function exam.  GI PROCEDURES:  EGD 01/11/2022: No gross lesions in esophagus. Biopsied. Dilated. Mild mucosal wrent at the UES noted. - Z-line regular, 39 cm from the incisors. - 2 cm hiatal hernia. Erythematous mucosa in the stomach - biopsied. - No gross lesions in the duodenal bulb, in the first portion of the duodenum and in the second portion of the duodenum. Biopsied. FINAL MICROSCOPIC DIAGNOSIS:  A. STOMACH, BIOPSY:  - Antral and oxyntic mucosa with hyperemia.  - No Helicobacter pylori identified.  B. DUODENUM, BIOPSY:  - Duodenal mucosa with intact villous architecture.  - No villous atrophy or increased intraepithelial lymphocytes.  C. ESOPHAGUS, RANDOM, BIOPSY:  - Squamous mucosa with focal, mildly increased eosinophils.  - Focally up to 17 per high-power field.  EGD 03/01/2010 which showed gastritis, a stone found at the GE junction, early esophageal stricture with few esophageal erosions. Esophagus dilated with 18 mm dilator with mild resistance otherwise was a normal exam. Path report showed gastric body  mucosa with minimal chronic inflammation, no evidence of Helicobacter pylori, intestinal metaplasia, dysplasia or  malignancy identified.    Colonoscopy by Dr. Deatra Ina 5/10/2 2011 which showed moderate diverticulosis to the sigmoid colon, no polyps.  Paternal aunts and uncles had stomach cancer and colon cancer.  Objective:  Vital signs in last 24 hours: Temp:  [97.7 F (36.5 C)-98 F (36.7 C)] 97.7 F (36.5 C) (02/08 0416) Pulse Rate:  [87-103] 87 (02/08 0416) Resp:  [14-21] 18 (02/07 2024) BP: (115-162)/(66-80) 115/66 (02/08 0416) SpO2:  [92 %-97 %] 92 % (02/08 0416) Weight:  [55.3 kg] 55.3 kg (02/08 0416) Last BM Date:  (01/11/2022) General: 86 year old female alert, conversant and hard of hearing sitting up in the chair no acute distress. Mouth: No active thrush.  Split uvula noted. Heart: Regular rate and rhythm, no murmurs Pulm: Breath sounds clear throughout. Abdomen: Soft, nontender.  Positive bowel sounds to all 4 quadrants.  No palpable mass. Extremities:  Without edema. Neurologic:  Alert and  oriented x 4.  Speech is clear.  Moves all extremities equally. Psych:  Alert and cooperative. Normal mood and affect.  Intake/Output from previous day: 02/07 0701 - 02/08 0700 In: 236 [P.O.:236] Out: -  Intake/Output this shift: No intake/output data recorded.  Lab Results: Recent Labs    01/16/22 0352  WBC 5.8  HGB 12.4  HCT 37.3  PLT 253   BMET Recent Labs    01/14/22 0334 01/16/22 0352  NA 133* 133*  K 3.8 3.9  CL 93* 95*  CO2 34* 31  GLUCOSE 117* 100*  BUN 10 12  CREATININE 0.51 0.53  CALCIUM 8.7* 8.7*   LFT Recent Labs    01/16/22 0352  PROT 5.5*  ALBUMIN 2.8*  AST 19  ALT 17  ALKPHOS 62  BILITOT 0.4   PT/INR No results for input(s): LABPROT, INR in the last 72 hours. Hepatitis Panel No results for input(s): HEPBSAG, HCVAB, HEPAIGM, HEPBIGM in the last 72 hours.  IR GASTROSTOMY TUBE MOD SED  Result Date: 01/15/2022 INDICATION: 86 year old woman with dysphagia esophageal stricture presents to IR for gastrostomy tube placement. EXAM: Attempted percutaneous  G-tube placement MEDICATIONS: Glucagon 1 mg IV ANESTHESIA/SEDATION: Moderate (conscious) sedation was employed during this procedure. A total of Versed 0.5 mg and Fentanyl 25 mcg was administered intravenously by the radiology nurse. Total intra-service moderate Sedation Time: 7 minutes. The patient's level of consciousness and vital signs were monitored continuously by radiology nursing throughout the procedure under my direct supervision. CONTRAST:  0-administered into the gastric lumen. FLUOROSCOPY: Radiation Exposure Index (as provided by the fluoroscopic device): 14 mGy Kerma COMPLICATIONS: None immediate. PROCEDURE: Informed written consent was obtained from the patient after a thorough discussion of the procedural risks, benefits and alternatives. All questions were addressed. Maximal Sterile Barrier Technique was utilized including caps, mask, sterile gowns, sterile gloves, sterile drape, hand hygiene and skin antiseptic. A timeout was performed prior to the initiation of the procedure. Scout image demonstrated mildly dilated colon filled with air and oral contrast. Nasogastric tube was inserted with tip position at the level of the stomach. The stomach was inflated, however the colon remained super imposed on the anterior stomach. No safe window could be identified. The procedure was terminated. IMPRESSION: Unable to identify safe percutaneous window for G-tube placement due to superimposed colon. Electronically Signed   By: Miachel Roux M.D.   On: 01/15/2022 16:44    Assessment / Plan:  35) 86 year old female with GERD, esophageal stricture, oropharyngeal  dysphagia admitted to the hospital 01/07/2022 with failure to thrive. A modified barium swallow with speech pathologist 01/08/2022 showed severe pharyngocervical dysphagia with both sensorimotor and obstructive dysphagia. EGD 01/11/2021 showed a mild mucosal wrent at the UES otherwise no gross lesions in her esophagus, a 2 cm hiatal hernia, erythematous  mucosa present in the stomach. Biopsies showed esophageal eosinophilia.  No contraindication for PEG placement. S/P PEG tube placement 01/15/2022 per IR was unsuccessful due to a superimposed colon which interfered with safe placement of a G tube. Albumin 2.8. Normal CBC.  She is afebrile.  Hemodynamically stable. -Pantoprazole 40 mg p.o. twice daily -Proceed with general surgery consult, to pursue surgical placement of PEG tube -Await further recommendations per Dr. Henrene Pastor  Principal Problem:   Failure to thrive in adult Active Problems:   Hypothyroidism   Benign paroxysmal positional vertigo   Dysphagia   Acute CHF (congestive heart failure) (HCC)   Sacral decubitus ulcer, stage II (HCC)   Malnutrition of moderate degree     LOS: 8 days   Patrecia Pour Kennedy-Smith  01/16/2022, 11:38AM  GI ATTENDING  Interval history data reviewed.  Agree with interval progress note as outlined above.  Unsuccessful attempt at IR gastrostomy tube placement due to interposed colon.  The same limitation would applied to a "blind" endoscopic approach.  The aforementioned anatomy would be a contraindication to the blind endoscopic gastrostomy tube placement approach.  This patient would be best served, if gastrostomy tube needed, with laparoscopic gastrostomy tube placement by general surgery.  Recommend consulting general surgery.  Thanks  Tasha Moss. Geri Seminole., M.D. [facility] Division of Gastroenterology

## 2022-01-16 NOTE — Assessment & Plan Note (Addendum)
-   Multifactorial, likely due to oropharyngeal dysphagia, poor nutritional status, chronic illness -Continue tube feeds with free water

## 2022-01-16 NOTE — H&P (View-Only) (Signed)
Consult Note  Tasha Moss 1936/10/26  [id number].    Requesting MD: Estill Cotta, MD Chief Complaint/Reason for Consult: dysphagia secondary to esophageal stricture HPI:  Patient is an 86 year old female who was admitted to [facility] 01/07/22 with failure to thrive. Patient was hospitalized in November 2022 s/p reduction of dislocated left total hip arthroplasty and then discharged to SNF. She contracted COVID while in rehab and developed vertigo with nausea and vomiting. Emesis initially thought to be vestibular in nature. She has also had progressive worsening of chronic dysphagia secondary to esophageal stricture. She had a barium swallow 12/27/21 which showed aspiration. SLP evaluated with MBS 1/31 which confirmed significant aspiration risk. GI was consulted and patient underwent EGD 2/3 with attempt to dilate esophageal stricture. No improvement in swallowing function s/p dilation. She had had a previous dilation back in 2011 and had done well after that. Family reports dysphagia has really worsened over the last 6 months with weight loss. Palliative care was consulted to discuss [place] and patient was clear that she and family wish to proceed with gastrostomy placement to supplement nutrition. IR consulted for PEG placement but were unable to secondary to patient anatomy. General surgery consulted for open gastrostomy placement.   PMH otherwise significant for hard of hearing, HTN, HLD, GERD, BPPV, Hypothyroidism s/p partial thyroidectomy , Chronic diastolic CHF and Sacral ulcer. No prior abdominal surgery. Her daughter, [name], was at bedside and assisted with patient history.   ROS: Review of Systems  Constitutional:  Positive for weight loss. Negative for chills and fever.  Respiratory:  Negative for shortness of breath and wheezing.   Cardiovascular:  Negative for chest pain and palpitations.  Gastrointestinal:  Negative for abdominal pain, nausea and vomiting.  Neurological:  Positive  for weakness (generalized).  All other systems reviewed and are negative.  Family History  Problem Relation Age of Onset   Diabetes Mother    Hepatitis Mother        Hepatitis B,? etiology   Miscarriages / Stillbirths Mother    Stroke Mother 68   Heart disease Father        CHF   Cancer Paternal Aunt        GI cancers   Heart disease Paternal Aunt        CHF   Cancer Paternal Uncle        lung, GI   Heart disease Paternal Uncle        CHF    Past Medical History:  Diagnosis Date   Allergy    seasonal & dust   Anxiety    Arthritis    DJD   Family history of adverse reaction to anesthesia    n/v    GERD (gastroesophageal reflux disease)    History of vertigo    Hyperlipidemia    Hypertension    Hypothyroidism    PONV (postoperative nausea and vomiting)    Thyroid disease    hypothyroidism post Hashimoto's & partial thyroidectomy    Past Surgical History:  Procedure Laterality Date   BIOPSY  01/11/2022   Procedure: BIOPSY;  Surgeon: Rush Landmark, Telford Nab., MD;  Location: [facility];  Service: Gastroenterology;;   CATARACT EXTRACTION W/ INTRAOCULAR LENS  IMPLANT, BILATERAL     COLONOSCOPY     diverticulosis   ESOPHAGOGASTRODUODENOSCOPY (EGD) WITH PROPOFOL N/A 01/11/2022   Procedure: ESOPHAGOGASTRODUODENOSCOPY (EGD) WITH PROPOFOL;  Surgeon: Irving Copas., MD;  Location: [facility];  Service: Gastroenterology;  Laterality: N/A;  IR GASTROSTOMY TUBE MOD SED  01/15/2022   JOINT REPLACEMENT  1995 & 1999   THR bilaterally    lumbar disc repair  1989   Dr Arrie Aran   SAVORY DILATION N/A 01/11/2022   Procedure: SAVORY DILATION;  Surgeon: Irving Copas., MD;  Location: [facility];  Service: Gastroenterology;  Laterality: N/A;   SPINE SURGERY     cervical ; Dr Vertell Limber   THYROIDECTOMY, PARTIAL     benign nodule   TONSILLECTOMY AND ADENOIDECTOMY     TOTAL SHOULDER ARTHROPLASTY Left 02/27/2016   Procedure: LEFT REVERSE TOTAL SHOULDER ARTHROPLASTY;   Surgeon: Renette Butters, MD;  Location: [facility];  Service: Orthopedics;  Laterality: Left;   vitreous detachment     Dr Claudean Kinds    Social History:  reports that she has never smoked. She has never used smokeless tobacco. She reports that she does not drink alcohol and does not use drugs.  Allergies:  Allergies  Allergen Reactions   Gabapentin     hallucinations   Other     Strong pain medications cause n/v   Morphine     Nausea & vomiting    Medications Prior to Admission  Medication Sig Dispense Refill   acetaminophen (TYLENOL) 500 MG tablet Take 500 mg by mouth every 6 (six) hours as needed for moderate pain.     Cholecalciferol (VITAMIN D3) 1000 UNITS CAPS Take 1,000 Units by mouth daily.     Cranberry 200 MG CAPS Take 1 capsule by mouth daily.     diclofenac Sodium (VOLTAREN) 1 % GEL Apply 2 g topically 4 (four) times daily as needed (Pain). 50 g 0   dimenhyDRINATE (DRAMAMINE) 50 MG tablet Take 50 mg by mouth every 6 (six) hours as needed for dizziness.     FLUoxetine (PROZAC) 20 MG capsule Take 1 capsule (20 mg total) by mouth daily. 30 capsule 0   fluticasone (FLONASE) 50 MCG/ACT nasal spray Place 1 spray into both nostrils daily.     levothyroxine (SYNTHROID) 125 MCG tablet Take 1 tablet (125 mcg total) by mouth daily before breakfast. 30 tablet 0   loratadine (CLARITIN) 10 MG tablet Take 10 mg by mouth daily.     meloxicam (MOBIC) 7.5 MG tablet Take 1 tablet (7.5 mg total) by mouth 2 (two) times daily. 60 tablet 0   Multiple Vitamin (MULTIVITAMIN PO) Take 400 mg by mouth daily.     Nutritional Supplements (ENSURE ENLIVE PO) Take 237 mLs by mouth 2 (two) times daily.     ondansetron (ZOFRAN) 8 MG tablet Take 1 tablet (8 mg total) by mouth 3 (three) times daily before meals. (Patient taking differently: Take 8 mg by mouth every 8 (eight) hours as needed for vomiting or nausea.) 90 tablet 0   pantoprazole (PROTONIX) 40 MG tablet Take 1 tablet (40 mg total) by mouth daily. 30  tablet 0   Polyvinyl Alcohol-Povidone (ARTIFICIAL TEARS) 5-6 MG/ML SOLN Place 2 drops into both eyes daily.     scopolamine (TRANSDERM-SCOP) 1 MG/3DAYS Place 1 patch (1.5 mg total) onto the skin every 3 (three) days. 10 patch 12   vitamin B-12 (CYANOCOBALAMIN) 250 MCG tablet Take 250 mcg by mouth daily.     polyethylene glycol powder (GLYCOLAX/MIRALAX) 17 GM/SCOOP powder Take 17 g by mouth 2 (two) times daily as needed. (Patient not taking: Reported on 12/20/2021) 3350 g 1    Blood pressure 115/66, pulse 87, temperature 97.7 F (36.5 C), temperature source Oral, resp. rate 18, height  5\' 5"  (1.651 m), weight 55.3 kg, SpO2 92 %. Physical Exam:  General: pleasant, WD, elderly female who is sitting up in chair in NAD HEENT: head is normocephalic, atraumatic.  Sclera are noninjected.  Pupils equal and round.  Ears and nose without any masses or lesions.  Mouth is pink and moist Heart: regular, rate, and rhythm. Palpable radial and pedal pulses bilaterally Lungs: Respiratory effort non-labored on room air, no audible wheezing Abd: soft, NT, ND, no masses, hernias, or organomegaly, no surgical scars noted MS: all 4 extremities are symmetrical with no cyanosis, clubbing, or edema. Skin: warm and dry with no masses, lesions, or rashes Neuro: Cranial nerves 2-12 grossly intact, sensation is normal throughout Psych: A&Ox3 with an appropriate affect.   Results for orders placed or performed during the hospital encounter of 01/07/22 (from the past 48 hour(s))  Glucose, capillary     Status: Abnormal   Collection Time: 01/15/22  4:49 PM  Result Value Ref Range   Glucose-Capillary 139 (H) 70 - 99 mg/dL    Comment: Glucose reference range applies only to samples taken after fasting for at least 8 hours.  Glucose, capillary     Status: Abnormal   Collection Time: 01/15/22  8:23 PM  Result Value Ref Range   Glucose-Capillary 126 (H) 70 - 99 mg/dL    Comment: Glucose reference range applies only to  samples taken after fasting for at least 8 hours.  Glucose, capillary     Status: Abnormal   Collection Time: 01/16/22 12:09 AM  Result Value Ref Range   Glucose-Capillary 120 (H) 70 - 99 mg/dL    Comment: Glucose reference range applies only to samples taken after fasting for at least 8 hours.  CBC     Status: None   Collection Time: 01/16/22  3:52 AM  Result Value Ref Range   WBC 5.8 4.0 - 10.5 K/uL   RBC 3.94 3.87 - 5.11 MIL/uL   Hemoglobin 12.4 12.0 - 15.0 g/dL   HCT 37.3 36.0 - 46.0 %   MCV 94.7 80.0 - 100.0 fL   MCH 31.5 26.0 - 34.0 pg   MCHC 33.2 30.0 - 36.0 g/dL   RDW 14.7 11.5 - 15.5 %   Platelets 253 150 - 400 K/uL   nRBC 0.0 0.0 - 0.2 %    Comment: Performed at [facility], [place] [address], [place], [place] [address]  Comprehensive metabolic panel     Status: Abnormal   Collection Time: 01/16/22  3:52 AM  Result Value Ref Range   Sodium 133 (L) 135 - 145 mmol/L   Potassium 3.9 3.5 - 5.1 mmol/L   Chloride 95 (L) 98 - 111 mmol/L   CO2 31 22 - 32 mmol/L   Glucose, Bld 100 (H) 70 - 99 mg/dL    Comment: Glucose reference range applies only to samples taken after fasting for at least 8 hours.   BUN 12 8 - 23 mg/dL   Creatinine, Ser 0.53 0.44 - 1.00 mg/dL   Calcium 8.7 (L) 8.9 - 10.3 mg/dL   Total Protein 5.5 (L) 6.5 - 8.1 g/dL   Albumin 2.8 (L) 3.5 - 5.0 g/dL   AST 19 15 - 41 U/L   ALT 17 0 - 44 U/L   Alkaline Phosphatase 62 38 - 126 U/L   Total Bilirubin 0.4 0.3 - 1.2 mg/dL   GFR, Estimated >60 >60 mL/min    Comment: (NOTE) Calculated using the CKD-EPI Creatinine Equation (2021)  Anion gap 7 5 - 15    Comment: Performed at [facility], [place] [address], [place], [place] [address]  Magnesium     Status: Abnormal   Collection Time: 01/16/22  3:52 AM  Result Value Ref Range   Magnesium 1.6 (L) 1.7 - 2.4 mg/dL    Comment: Performed at [facility], [place] [address], [place], [place] [address]  Phosphorus      Status: Abnormal   Collection Time: 01/16/22  3:52 AM  Result Value Ref Range   Phosphorus 4.7 (H) 2.5 - 4.6 mg/dL    Comment: Performed at [facility], [place] [address] [place], [place] [address]  Glucose, capillary     Status: Abnormal   Collection Time: 01/16/22  4:18 AM  Result Value Ref Range   Glucose-Capillary 100 (H) 70 - 99 mg/dL    Comment: Glucose reference range applies only to samples taken after fasting for at least 8 hours.  Glucose, capillary     Status: None   Collection Time: 01/16/22  7:38 AM  Result Value Ref Range   Glucose-Capillary 93 70 - 99 mg/dL    Comment: Glucose reference range applies only to samples taken after fasting for at least 8 hours.   IR GASTROSTOMY TUBE MOD SED  Result Date: 01/15/2022 INDICATION: 86 year old woman with dysphagia esophageal stricture presents to IR for gastrostomy tube placement. EXAM: Attempted percutaneous G-tube placement MEDICATIONS: Glucagon 1 mg IV ANESTHESIA/SEDATION: Moderate (conscious) sedation was employed during this procedure. A total of Versed 0.5 mg and Fentanyl 25 mcg was administered intravenously by the radiology nurse. Total intra-service moderate Sedation Time: 7 minutes. The patient's level of consciousness and vital signs were monitored continuously by radiology nursing throughout the procedure under my direct supervision. CONTRAST:  0-administered into the gastric lumen. FLUOROSCOPY: Radiation Exposure Index (as provided by the fluoroscopic device): 14 mGy Kerma COMPLICATIONS: None immediate. PROCEDURE: Informed written consent was obtained from the patient after a thorough discussion of the procedural risks, benefits and alternatives. All questions were addressed. Maximal Sterile Barrier Technique was utilized including caps, mask, sterile gowns, sterile gloves, sterile drape, hand hygiene and skin antiseptic. A timeout was performed prior to the initiation of the procedure. Scout image demonstrated  mildly dilated colon filled with air and oral contrast. Nasogastric tube was inserted with tip position at the level of the stomach. The stomach was inflated, however the colon remained super imposed on the anterior stomach. No safe window could be identified. The procedure was terminated. IMPRESSION: Unable to identify safe percutaneous window for G-tube placement due to superimposed colon. Electronically Signed   By: Miachel Roux M.D.   On: 01/15/2022 16:44      Assessment/Plan Esophageal stricture Severe protein calorie malnutrition  Generalized weakness/FTT - followed by [facility], s/p EGD 2/3 with dilation - dysphagia was not improved s/p dilation - IR was consulted for PEG placement but colon was superimposed over stomach - palliative has seen as well and patient and family wish to proceed with feeding tube placement - overall goal is to be able to get patient home to be with family  - will plan to proceed with open gastrostomy placement tomorrow   FEN: nectar thick liquids - NPO after MN VTE: LMWH ID: no current abx - Ancef ordered pre-op  - below per TRH -  HTN HLD GERD BPPV Hypothyroidism s/p partial thyroidectomy  Chronic diastolic CHF Sacral ulcer   I reviewed Consultant GI and palliative care notes,  hospitalist notes, last 24 h vitals and pain scores, last 48 h intake and output, last 24 h labs and trends, and last 24 h imaging results.  This care required moderate level of medical decision making.   Norm Parcel, [facility] 01/16/2022, 10:36 AM Please see Amion for pager number during day hours 7:00am-4:30pm

## 2022-01-16 NOTE — Progress Notes (Signed)
°   01/16/22 1400  Mobility  Activity Ambulated with assistance in hallway  Level of Assistance Standby assist, set-up cues, supervision of patient - no hands on  Assistive Device Front wheel walker  Distance Ambulated (ft) 50 ft  Activity Response Tolerated well  $Mobility charge 1 Mobility   Pt agreeable to mobilize this afternoon. Ambulated about 53ft in hall with RW, tolerated well. No complaints. Left pt in chair, call bell at side. RN/NT notified of session.   [place] [facility]ist [facility] Re[facility] Services Office: [phone]

## 2022-01-16 NOTE — Progress Notes (Signed)
[facility]                                                                               Tasha Moss, is a 86 y.o. female, DOB - 1936-05-08, NOM:[id number] Admit date - 01/07/2022    Outpatient Primary MD for the patient is [name], MD  LOS - 8  days    Brief summary   86 yo F with hx BPPV, hypertension, HLD, hypothyroidism s/p partial thyroidectomy, GERD, esophageal stricture who presented with generalized weakness.  Sounds like was admitted with closed left hip dislocation s/p reduction in November and was discharged to SNF.  She caught COVID in rehab and has subsequently developed progressive vertigo with nausea and vomiting.  She's also had progressive dysphagia and issues with passage of solid food.  She had barium swallow on 1/19 where she aspirated into the trachea and plan was for MBS at [facility] this week.  She continued to have dizziness with vertigo and N/V.  She's developed progressive weakness.  She also had worsening edema and imaging concerning for HF.  She's been admitted with failure to thrive.    -Pending PEG tube placement.  Once tube feeds tolerated, plan disposition.   Assessment & Plan    Assessment and Plan: * Failure to thrive in adult- (present on admission) - Multifactorial, related to vertigo, dysphagia, poor nutritional status   Malnutrition of moderate degree - Multifactorial, likely due to oropharyngeal dysphagia, poor nutritional status, chronic illness  Sacral decubitus ulcer, stage II (HCC) - Continue wound care consult recommendations  Acute CHF (congestive heart failure) (HCC) -CT with CHF, cardiomegaly, pulm vascular prominence, trace effusions, interstitial edema, possibly early alveolar edema.  Received Lasix x1. -Appears to be compensated and euvolemic -2D echo 2/1 showed EF of 65%, G1 DD  Dysphagia - Followed outpatient by GI, underwent upper GI series as outpatient on 1/19 with aspiration of contrast into  trachea at the level of mainstem bronchi.  -SLP consulted.  MBS showed pharyngeal esophageal and pharyngeal phase dysphagia-chronic due to position of cervical spine with exacerbation related to her current illness/deconditioning -Recommended full liquid, nectar thick diet. -EGD on 2/3, esophageal dilation.  Recommended PEG tube placement -Palliative medicine consulted and patient, family wishes to proceed with PEG -IR unable to place PEG due to anatomy.  General surgery consulted.   Benign paroxysmal positional vertigo- (present on admission) - Continue scopolamine patch, avoid Dramamine due to anticholinergic side effects.   -CT head on 1/30 head showed no acute abnormality.   -Continue PT OT vestibular rehab  Hypothyroidism- (present on admission) - TSH mildly elevated -Follow-up outpatient after acute illness is resolved   Pressure Injury  Pressure Injury 01/07/22 Perineum Right (Active)  01/07/22 2130  Location: Perineum  Location Orientation: Right  Staging:   Wound Description (Comments):   Present on Admission: Yes     Code Status: Full CODE STATUS DVT Prophylaxis:  enoxaparin (LOVENOX) injection 40 mg Start: 01/07/22 2200   Level of Care: Level of care: Telemetry Family Communication: Updated patient's daughter at the bedside  Disposition Plan:     Remains inpatient appropriate: Awaiting PEG tube  Procedures:  EGD  Consultants:   IR General surgery Gastroenterology Palliative medicine  Antimicrobials:   Anti-infectives (From admission, onward)    Start     Dose/Rate Route Frequency Ordered Stop   01/17/22 0800  ceFAZolin (ANCEF) IVPB 2g/100 mL premix        2 g 200 mL/hr over 30 Minutes Intravenous  Once 01/16/22 1153     01/15/22 1548  ceFAZolin (ANCEF) 2-4 GM/100ML-% IVPB  Status:  Discontinued       Note to Pharmacy: Lesia Hausen: cabinet override      01/15/22 1548 01/15/22 1618   01/15/22 1400  ceFAZolin (ANCEF) IVPB 2g/100 mL premix        2  g 200 mL/hr over 30 Minutes Intravenous To Radiology 01/13/22 0933 01/16/22 1400   01/13/22 2200  cephALEXin (KEFLEX) capsule 500 mg        500 mg Oral Every 12 hours 01/13/22 1347 01/14/22 0950   01/09/22 1630  cephALEXin (KEFLEX) capsule 500 mg  Status:  Discontinued        500 mg Oral Every 12 hours 01/09/22 1531 01/13/22 1347        Medications  Scheduled Meds:  cholecalciferol  1,000 Units Oral Daily   enoxaparin (LOVENOX) injection  40 mg Subcutaneous Q24H   feeding supplement  237 mL Oral TID BM   feeding supplement (PROSource TF)  45 mL Per Tube Daily   FLUoxetine  20 mg Oral Daily   free water  100 mL Per Tube Q4H   furosemide  40 mg Intravenous Once   levothyroxine  125 mcg Oral QAC breakfast   loratadine  10 mg Oral Daily   multivitamin with minerals  1 tablet Oral Daily   nystatin  5 mL Mouth/Throat QID   ondansetron (ZOFRAN) IV  4 mg Intravenous Once   pantoprazole  40 mg Oral BID   polyethylene glycol  17 g Oral Daily   polyvinyl alcohol  2 drop Both Eyes Daily   scopolamine  1 patch Transdermal Q72H   thiamine  100 mg Oral Daily   vitamin B-12  250 mcg Oral Daily   Continuous Infusions:   ceFAZolin (ANCEF) IV     [START ON 01/17/2022]  ceFAZolin (ANCEF) IV     feeding supplement (OSMOLITE 1.5 CAL)     PRN Meds:.acetaminophen, ondansetron    Subjective:   Tasha Moss was seen and examined today.  Unable to place PEG by IR.  Patient otherwise has no acute complaints.  Daughter at the bedside.  Patient denies dizziness, chest pain, shortness of breath, abdominal pain.  No acute events overnight  Objective:   Vitals:   01/15/22 1622 01/15/22 1643 01/15/22 2024 01/16/22 0416  BP: (!) 147/67 128/67 119/73 115/66  Pulse: 97 97 90 87  Resp:   18   Temp:  97.9 F (36.6 C) 98 F (36.7 C) 97.7 F (36.5 C)  TempSrc:  Oral Oral Oral  SpO2: 93% 92% 95% 92%  Weight:    55.3 kg  Height:        Intake/Output Summary (Last 24 hours) at 01/16/2022 1214 Last  data filed at 01/16/2022 1000 Gross per 24 hour  Intake 356 ml  Output --  Net 356 ml   Filed Weights   01/14/22 0400 01/15/22 0500 01/16/22 0416  Weight: 57.2 kg 61.2 kg 55.3 kg     Exam General: Alert and oriented x 3, NAD, pleasant, sitting up Cardiovascular: S1 S2 auscultated, no murmurs, RRR Respiratory: Clear to auscultation bilaterally,  no wheezing, rales Gastrointestinal: Soft, nontender, nondistended, + bowel sounds Ext: no pedal edema bilaterally Neuro: no new deficits, alert and oriented   Data Reviewed:  I have personally reviewed following labs and imaging studies   CBC Lab Results  Component Value Date   WBC 5.8 01/16/2022   RBC 3.94 01/16/2022   HGB 12.4 01/16/2022   HCT 37.3 01/16/2022   MCV 94.7 01/16/2022   MCH 31.5 01/16/2022   PLT 253 01/16/2022   MCHC 33.2 01/16/2022   RDW 14.7 01/16/2022   LYMPHSABS 1.2 01/09/2022   MONOABS 0.7 01/09/2022   EOSABS 0.2 01/09/2022   BASOSABS 0.0 12/75/1700     Last metabolic panel Lab Results  Component Value Date   NA 133 (L) 01/16/2022   K 3.9 01/16/2022   CL 95 (L) 01/16/2022   CO2 31 01/16/2022   BUN 12 01/16/2022   CREATININE 0.53 01/16/2022   GLUCOSE 100 (H) 01/16/2022   GFRNONAA >60 01/16/2022   GFRAA >60 02/19/2016   CALCIUM 8.7 (L) 01/16/2022   PHOS 4.7 (H) 01/16/2022   PROT 5.5 (L) 01/16/2022   ALBUMIN 2.8 (L) 01/16/2022   BILITOT 0.4 01/16/2022   ALKPHOS 62 01/16/2022   AST 19 01/16/2022   ALT 17 01/16/2022   ANIONGAP 7 01/16/2022    CBG (last 3)  Recent Labs    01/16/22 0418 01/16/22 0738 01/16/22 1136  GLUCAP 100* 93 123*     Radiology Studies: IR GASTROSTOMY TUBE MOD SED  Result Date: 01/15/2022 INDICATION: 86 year old woman with dysphagia esophageal stricture presents to IR for gastrostomy tube placement.  IMPRESSION: Unable to identify safe percutaneous window for G-tube placement due to superimposed colon. Electronically Signed   By: Miachel Roux M.D.   On: 01/15/2022  16:44       Banita Lehn M.D. [facility] 01/16/2022, 12:14 PM  Available via Epic secure chat 7am-7pm After 7 pm, please refer to night coverage provider listed on amion.

## 2022-01-17 ENCOUNTER — Inpatient Hospital Stay (HOSPITAL_COMMUNITY): Payer: Medicare Other | Admitting: Certified Registered Nurse Anesthetist

## 2022-01-17 ENCOUNTER — Encounter (HOSPITAL_COMMUNITY)
Admission: EM | Disposition: A | Discharge status: Home Health | Payer: Self-pay | Source: Home / Self Care | Attending: Family Medicine

## 2022-01-17 ENCOUNTER — Encounter (HOSPITAL_COMMUNITY): Payer: Self-pay | Admitting: Family Medicine

## 2022-01-17 DIAGNOSIS — E43 Unspecified severe protein-calorie malnutrition: Secondary | ICD-10-CM

## 2022-01-17 DIAGNOSIS — R1310 Dysphagia, unspecified: Secondary | ICD-10-CM

## 2022-01-17 HISTORY — PX: GASTROSTOMY: SHX5249

## 2022-01-17 LAB — GLUCOSE, CAPILLARY
Glucose-Capillary: 102 mg/dL — ABNORMAL HIGH (ref 70–99)
Glucose-Capillary: 122 mg/dL — ABNORMAL HIGH (ref 70–99)
Glucose-Capillary: 141 mg/dL — ABNORMAL HIGH (ref 70–99)
Glucose-Capillary: 141 mg/dL — ABNORMAL HIGH (ref 70–99)

## 2022-01-17 LAB — PHOSPHORUS
Phosphorus: 4.3 mg/dL (ref 2.5–4.6)
Phosphorus: 5 mg/dL — ABNORMAL HIGH (ref 2.5–4.6)

## 2022-01-17 LAB — MAGNESIUM
Magnesium: 1.7 mg/dL (ref 1.7–2.4)
Magnesium: 1.8 mg/dL (ref 1.7–2.4)

## 2022-01-17 SURGERY — INSERTION OF GASTROSTOMY TUBE
Anesthesia: General

## 2022-01-17 MED ORDER — ROCURONIUM BROMIDE 10 MG/ML (PF) SYRINGE
PREFILLED_SYRINGE | INTRAVENOUS | Status: DC | PRN
  Administered 2022-01-17: 40 mg via INTRAVENOUS

## 2022-01-17 MED ORDER — OSMOLITE 1.5 CAL PO LIQD
1000.0000 mL | ORAL | Status: DC
  Filled 2022-01-17: qty 1000

## 2022-01-17 MED ORDER — PROPOFOL 1000 MG/100ML IV EMUL
INTRAVENOUS | Status: AC
  Filled 2022-01-17: qty 100

## 2022-01-17 MED ORDER — BUPIVACAINE-EPINEPHRINE (PF) 0.25% -1:200000 IJ SOLN
INTRAMUSCULAR | Status: AC
  Filled 2022-01-17: qty 30

## 2022-01-17 MED ORDER — OXYCODONE HCL 5 MG/5ML PO SOLN
2.5000 mg | Freq: Four times a day (QID) | ORAL | Status: DC | PRN
  Administered 2022-01-19 – 2022-01-20 (×2): 5 mg
  Filled 2022-01-17 (×3): qty 5

## 2022-01-17 MED ORDER — PHENYLEPHRINE HCL (PRESSORS) 10 MG/ML IV SOLN
INTRAVENOUS | Status: AC
  Filled 2022-01-17: qty 1

## 2022-01-17 MED ORDER — DEXAMETHASONE SODIUM PHOSPHATE 10 MG/ML IJ SOLN
INTRAMUSCULAR | Status: AC
  Filled 2022-01-17: qty 1

## 2022-01-17 MED ORDER — PHENYLEPHRINE HCL-NACL 20-0.9 MG/250ML-% IV SOLN
INTRAVENOUS | Status: DC | PRN
  Administered 2022-01-17: 50 ug/min via INTRAVENOUS

## 2022-01-17 MED ORDER — FENTANYL CITRATE PF 50 MCG/ML IJ SOSY: 25.0000 ug | PREFILLED_SYRINGE | INTRAMUSCULAR | Status: DC | PRN

## 2022-01-17 MED ORDER — PROPOFOL 10 MG/ML IV BOLUS
INTRAVENOUS | Status: DC | PRN
  Administered 2022-01-17: 120 mg via INTRAVENOUS

## 2022-01-17 MED ORDER — LACTATED RINGERS IV SOLN: INTRAVENOUS | Status: DC

## 2022-01-17 MED ORDER — FENTANYL CITRATE PF 50 MCG/ML IJ SOSY
PREFILLED_SYRINGE | INTRAMUSCULAR | Status: AC
  Filled 2022-01-17: qty 1

## 2022-01-17 MED ORDER — BUPIVACAINE-EPINEPHRINE 0.25% -1:200000 IJ SOLN
INTRAMUSCULAR | Status: DC | PRN
  Administered 2022-01-17: 30 mL

## 2022-01-17 MED ORDER — FENTANYL CITRATE (PF) 100 MCG/2ML IJ SOLN
INTRAMUSCULAR | Status: AC
  Filled 2022-01-17: qty 2

## 2022-01-17 MED ORDER — PHENYLEPHRINE 40 MCG/ML (10ML) SYRINGE FOR IV PUSH (FOR BLOOD PRESSURE SUPPORT)
PREFILLED_SYRINGE | INTRAVENOUS | Status: AC
  Filled 2022-01-17: qty 10

## 2022-01-17 MED ORDER — ONDANSETRON HCL 4 MG/2ML IJ SOLN
4.0000 mg | INTRAMUSCULAR | Status: DC | PRN
  Administered 2022-01-20 – 2022-01-23 (×2): 4 mg via INTRAVENOUS
  Filled 2022-01-17 (×2): qty 2

## 2022-01-17 MED ORDER — SUGAMMADEX SODIUM 200 MG/2ML IV SOLN
INTRAVENOUS | Status: DC | PRN
  Administered 2022-01-17: 150 mg via INTRAVENOUS

## 2022-01-17 MED ORDER — FREE WATER
100.0000 mL | Status: DC
  Administered 2022-01-18 – 2022-01-19 (×6): 100 mL

## 2022-01-17 MED ORDER — ACETAMINOPHEN 10 MG/ML IV SOLN
1000.0000 mg | Freq: Once | INTRAVENOUS | Status: DC | PRN
  Administered 2022-01-17: 1000 mg via INTRAVENOUS

## 2022-01-17 MED ORDER — ROCURONIUM BROMIDE 10 MG/ML (PF) SYRINGE
PREFILLED_SYRINGE | INTRAVENOUS | Status: AC
  Filled 2022-01-17: qty 10

## 2022-01-17 MED ORDER — PROPOFOL 10 MG/ML IV BOLUS
INTRAVENOUS | Status: AC
  Filled 2022-01-17: qty 20

## 2022-01-17 MED ORDER — ACETAMINOPHEN 10 MG/ML IV SOLN
INTRAVENOUS | Status: AC
  Filled 2022-01-17: qty 100

## 2022-01-17 MED ORDER — PHENYLEPHRINE 40 MCG/ML (10ML) SYRINGE FOR IV PUSH (FOR BLOOD PRESSURE SUPPORT)
PREFILLED_SYRINGE | INTRAVENOUS | Status: DC | PRN
  Administered 2022-01-17: 200 ug via INTRAVENOUS
  Administered 2022-01-17: 120 ug via INTRAVENOUS
  Administered 2022-01-17 (×2): 80 ug via INTRAVENOUS

## 2022-01-17 MED ORDER — LIDOCAINE 2% (20 MG/ML) 5 ML SYRINGE
INTRAMUSCULAR | Status: DC | PRN
  Administered 2022-01-17: 100 mg via INTRAVENOUS

## 2022-01-17 MED ORDER — AMISULPRIDE (ANTIEMETIC) 5 MG/2ML IV SOLN: 10.0000 mg | Freq: Once | INTRAVENOUS | Status: DC | PRN

## 2022-01-17 MED ORDER — PROSOURCE TF PO LIQD
45.0000 mL | Freq: Every day | ORAL | Status: DC
  Administered 2022-01-18 – 2022-01-22 (×5): 45 mL
  Filled 2022-01-17 (×6): qty 45

## 2022-01-17 MED ORDER — LIDOCAINE HCL (PF) 2 % IJ SOLN
INTRAMUSCULAR | Status: AC
  Filled 2022-01-17: qty 5

## 2022-01-17 MED ORDER — ONDANSETRON HCL 4 MG/2ML IJ SOLN
INTRAMUSCULAR | Status: DC | PRN
  Administered 2022-01-17: 4 mg via INTRAVENOUS

## 2022-01-17 MED ORDER — DEXAMETHASONE SODIUM PHOSPHATE 4 MG/ML IJ SOLN
INTRAMUSCULAR | Status: DC | PRN
Start: 2022-01-17 — End: 2022-01-17
  Administered 2022-01-17: 5 mg via INTRAVENOUS

## 2022-01-17 MED ORDER — ONDANSETRON HCL 4 MG/2ML IJ SOLN
INTRAMUSCULAR | Status: AC
  Filled 2022-01-17: qty 2

## 2022-01-17 MED ORDER — FENTANYL CITRATE (PF) 100 MCG/2ML IJ SOLN
INTRAMUSCULAR | Status: DC | PRN
  Administered 2022-01-17 (×6): 25 ug via INTRAVENOUS

## 2022-01-17 MED ORDER — PROPOFOL 500 MG/50ML IV EMUL
INTRAVENOUS | Status: DC | PRN
  Administered 2022-01-17: 50 ug/kg/min via INTRAVENOUS

## 2022-01-17 MED ORDER — KETOROLAC TROMETHAMINE 15 MG/ML IJ SOLN
15.0000 mg | Freq: Four times a day (QID) | INTRAMUSCULAR | Status: AC
  Administered 2022-01-17 – 2022-01-22 (×16): 15 mg via INTRAVENOUS
  Filled 2022-01-17 (×16): qty 1

## 2022-01-17 MED ORDER — FREE WATER: 100.0000 mL | Status: DC

## 2022-01-17 SURGICAL SUPPLY — 36 items
ADH SKN CLS APL DERMABOND .7 (GAUZE/BANDAGES/DRESSINGS) ×1
APL PRP STRL LF DISP 70% ISPRP (MISCELLANEOUS) ×1
BAG COUNTER SPONGE SURGICOUNT (BAG) IMPLANT
BAG DRN RND TRDRP ANRFLXCHMBR (UROLOGICAL SUPPLIES) ×1
BAG SPNG CNTER NS LX DISP (BAG)
BAG URINE DRAIN 2000ML AR STRL (UROLOGICAL SUPPLIES) ×2 IMPLANT
CATH GASTROSTOMY 24FR (CATHETERS) ×2 IMPLANT
CHLORAPREP W/TINT 26 (MISCELLANEOUS) ×2 IMPLANT
COVER SURGICAL LIGHT HANDLE (MISCELLANEOUS) ×2 IMPLANT
DERMABOND ADVANCED (GAUZE/BANDAGES/DRESSINGS) ×1
DERMABOND ADVANCED .7 DNX12 (GAUZE/BANDAGES/DRESSINGS) IMPLANT
DRAPE LAPAROSCOPIC ABDOMINAL (DRAPES) ×2 IMPLANT
DRAPE WARM FLUID 44X44 (DRAPES) ×2 IMPLANT
G-TUBE MIC 24F 7-10 BALLOON (CATHETERS) IMPLANT
GAUZE SPONGE 4X4 12PLY STRL (GAUZE/BANDAGES/DRESSINGS) IMPLANT
GLOVE SRG 8 PF TXTR STRL LF DI (GLOVE) ×1 IMPLANT
GLOVE SURG POLY ORTHO LF SZ7.5 (GLOVE) IMPLANT
GLOVE SURG UNDER POLY LF SZ8 (GLOVE) ×2
GOWN STRL REUS W/TWL XL LVL3 (GOWN DISPOSABLE) ×4 IMPLANT
HANDLE SUCTION POOLE (INSTRUMENTS) IMPLANT
KIT BASIN OR (CUSTOM PROCEDURE TRAY) ×2 IMPLANT
NEEDLE HYPO 22GX1.5 SAFETY (NEEDLE) IMPLANT
PACK GENERAL/GYN (CUSTOM PROCEDURE TRAY) ×2 IMPLANT
SPONGE DRAIN TRACH 4X4 STRL 2S (GAUZE/BANDAGES/DRESSINGS) ×2 IMPLANT
SPONGE T-LAP 18X18 ~~LOC~~+RFID (SPONGE) IMPLANT
STAPLER VISISTAT 35W (STAPLE) IMPLANT
SUCTION POOLE HANDLE (INSTRUMENTS)
SUT ETHILON 2 0 PS N (SUTURE) ×3 IMPLANT
SUT MNCRL AB 4-0 PS2 18 (SUTURE) ×2 IMPLANT
SUT PDS AB 1 CT1 27 (SUTURE) ×2 IMPLANT
SUT PROLENE 2 0 BLUE (SUTURE) IMPLANT
SUT VIC AB 0 UR5 27 (SUTURE) IMPLANT
SUT VIC AB 2-0 SH 18 (SUTURE) ×1 IMPLANT
SYR 20ML LL LF (SYRINGE) ×2 IMPLANT
TOWEL OR 17X26 10 PK STRL BLUE (TOWEL DISPOSABLE) ×2 IMPLANT
TOWEL OR NON WOVEN STRL DISP B (DISPOSABLE) ×2 IMPLANT

## 2022-01-17 NOTE — Progress Notes (Signed)
NUTRITION NOTE   Last full follow-up by this RD on 2/7. Patient currently out of the room to OR for open G-tube placement; IR unable to place PEG d/t colon overlying stomach (anatomy). No one in the room at the time of RD stopping by about 1 hour ago.  Orders for tube feeding updated to start tomorrow at 1200: Osmolite 1.5 to start at 20 ml/hr on 2/8 at 1500 and advance by 10 ml every 24 hours to reach goal rate of 20 ml/hr with 45 ml Prosource TF once/day and 100 ml free water every 4 hours.  At goal rate, this regimen will provide 1480 (84% kcal need), 71 grams protein (83% protein need), and 1331 ml free water.  Will start patient on continuous TF to monitor tolerance and better mitigate risk of refeeding.  Will transition to bolus regimen once tolerance confirmed and risk of refeeding decreases.    Estimated Nutritional Needs:  Kcal:  1750-1950 kcal Protein:  85-95 grams Fluid:  >/= 1.9 L/day      Jarome Matin, MS, RD, LDN Inpatient Clinical Dietitian RD pager # available in [place]  After hours/weekend pager # available in [facility]

## 2022-01-17 NOTE — Progress Notes (Signed)
SLP Cancellation Note  Patient Details Name: Tasha Moss MRN: [id number] DOB: 24-Nov-1936   Cancelled treatment:       Reason Eval/Treat Not Completed: Other (comment) (pt in the OR for open PEG tube placement)  Kathleen Lime, MS [place] Office [phone] Cell [phone]  Macario Golds 01/17/2022, 1:51 PM

## 2022-01-17 NOTE — Interval H&P Note (Signed)
History and Physical Interval Note:  01/17/2022 12:25 PM  Tasha Moss  has presented today for surgery, with the diagnosis of DYSPHAGIA.  The various methods of treatment have been discussed with the patient and family. After consideration of risks, benefits and other options for treatment, the patient has consented to  Procedure(s): OPEN GASTROSTOMY PLACEMENT (N/A) as a surgical intervention.  The patient's history has been reviewed, patient examined, no change in status, stable for surgery.  I have reviewed the patient's chart and labs.  Questions were answered to the patient's satisfaction.    The procedure has been discussed with the patient.  Alternative therapies have been discussed with the patient.  Operative risks include bleeding,  Infection,  Organ injury,  Nerve injury,  Blood vessel injury,  DVT,  Pulmonary embolism,  Death,  And possible reoperation.  Medical management risks include worsening of present situation.  The success of the procedure is 50 -90 % at treating patients symptoms.  The patient understands and agrees to proceed.    [place]

## 2022-01-17 NOTE — Op Note (Signed)
Preoperative diagnosis: Severe protein calorie malnutrition and dysphagia  Postoperative diagnosis: Same  Procedure: Placement of the 24 [place] Stamm gastrostomy  Surgeon: Erroll Luna, MD  Anesthesia: General with 0.25% Marcaine plain  EBL: Minimal  Specimen: None  Drains: None  IV fluids: Per anesthesia record  Indications for procedure: Patient presents for feeding tube access due to severe dysphagia secondary to esophageal stricture and significant weight loss.  She was met in the hospital and the case was discussed with her daughter.  Pros and cons of feeding tube placement discussed with the patient and family.  Risk of bleeding, infection, tube dislodgment, organ injury, death, DVT, sepsis, wound complications, tube complications and the need for other interventions and procedures discussed.  They agree to proceed.  Description of procedure: The patient was met in the holding area.  All questions were answered.  Her daughter was present as well and the procedure discussed with her.  She is then taken back to the operating.  She was placed upon upon the OR table.  After induction of general anesthesia, the upper abdomen was prepped and draped in sterile fashion and timeout performed.  Proper patient, site and procedure were verified.  Local anesthetic was titrated just below the xiphoid process for 5 cm.  Upper midline incision was used.  Dissection was carried in the midline fascia was opened.  Retractor was placed.  Stomach was identified and grasped with a Babcock.  Of note I also identified the transverse colon separate from that.  Once the stomach was brought up into the field, a pursestring suture of 2-0 Vicryl was placed in the anterior wall in the mid body.  Through separate stab incision the 24 French gastrostomy tube was brought in.  Balloon was tested and was adequate.  The gastrotomy was performed.  The feeding tube was placed through this.  The balloon was filled with 10 cc  of saline.  This was pulled up flush to the underside of the abdominal wall.  There is no kinking of the tube.  A second pursestring suture was placed around this and tied snug.  We then tacked the stomach with 3 sutures around the tube to the anterior abdominal wall to reduce tension.  We then flushed the tube with saline and I saw no evidence of leakage from the tube.  It appeared snug to the anterior abdominal wall without undue tension.  I then used the flange to secure the tube to the skin with 2-0 nylon.  A single stitch of Vicryl was placed around the central portion of the securing strap to keep it snug with the tube.  There is no undue tension on the skin or anterior abdominal wall.  Reexamination revealed the tube to be in good position.  No evidence of injury to any neighboring structures.  We then infiltrated the fascia with 0.25% Marcaine.  Fascia was closed with #1 PDS.  Subcu tissues approximated Vicryl 3-0 and 4-0 Monocryl used to close skin in a subcuticular fashion.  Dermabond applied.  Gastrostomy tube placed to gravity drainage.  All counts were found to be correct.  The patient was awoke extubated taken to recovery in satisfactory condition.

## 2022-01-17 NOTE — TOC Progression Note (Signed)
Transition of Care [facility]) - Progression Note    Patient Details  Name: ABREY BRADWAY MRN: [id number] Date of Birth: [birth year]  [facility] of [facility] CM/SW Contact  Leeroy Cha, RN Phone Number: 01/17/2022, 8:27 AM  Clinical Narrative:    Charts and note reviewed . No toc needs at this time. Plan is home with s=elf care and poss. pt   Expected Discharge Plan: [place] Barriers to Discharge: Continued Medical Work up  Expected Discharge Plan and Services Expected Discharge Plan: [place]   Discharge Planning Services: CM Consult   Living arrangements for the past 2 months: Single Family Home                                       Social Determinants of Health (SDOH) Interventions    Readmission Risk Interventions No flowsheet data found.

## 2022-01-17 NOTE — Anesthesia Postprocedure Evaluation (Signed)
Anesthesia Post Note  Patient: Tasha Moss  Procedure(s) Performed: OPEN GASTROSTOMY PLACEMENT     Patient location during evaluation: PACU Anesthesia Type: General Level of consciousness: awake and alert and oriented Pain management: pain level controlled Vital Signs Assessment: post-procedure vital signs reviewed and stable Respiratory status: spontaneous breathing, nonlabored ventilation and respiratory function stable Cardiovascular status: blood pressure returned to baseline and stable Postop Assessment: no apparent nausea or vomiting Anesthetic complications: no   No notable events documented.  Last Vitals:  Vitals:   01/17/22 1430 01/17/22 1445  BP: (!) 153/64 (!) 153/68  Pulse: 88 95  Resp: 13 20  Temp:    SpO2: 96% 96%    Last Pain:  Vitals:   01/17/22 1129  TempSrc: Oral  PainSc: 0-No pain                 Samil Mecham A.

## 2022-01-17 NOTE — Progress Notes (Signed)
°   01/17/22 1100  Mobility  Activity Ambulated with assistance in hallway  Level of Assistance Standby assist, set-up cues, supervision of patient - no hands on  Assistive Device Front wheel walker  Distance Ambulated (ft) 60 ft  Activity Response Tolerated well  $Mobility charge 1 Mobility   Pt agreeable to mobilize this morning. Ambulated about 13ft in hall with RW, tolerated well. No complaints. Left pt in chair, call bell at side. RN/NT notified of session.   [place] [facility] Services Office: [phone]

## 2022-01-17 NOTE — Care Management Important Message (Signed)
Important Message  Patient Details IM Letter placed in Patients room. Name: Tasha Moss MRN: [id number] Date of Birth: 06-15-1936   Medicare Important Message Given:  Yes     Kerin Salen 01/17/2022, 12:52 PM

## 2022-01-17 NOTE — Transfer of Care (Signed)
Immediate Anesthesia Transfer of Care Note  Patient: Tasha Moss  Procedure(s) Performed: OPEN GASTROSTOMY PLACEMENT  Patient Location: PACU  Anesthesia Type:General  Level of Consciousness: awake, alert , oriented and patient cooperative  Airway & Oxygen Therapy: Patient Spontanous Breathing and Patient connected to face mask  Post-op Assessment: Report given to RN and Post -op Vital signs reviewed and stable  Post vital signs: Reviewed and stable  Last Vitals:  Vitals Value Taken Time  BP 127/85 01/17/22 1415  Temp 36.8 C 01/17/22 1415  Pulse 88 01/17/22 1418  Resp 16 01/17/22 1418  SpO2 99 % 01/17/22 1418  Vitals shown include unvalidated device data.  Last Pain:  Vitals:   01/17/22 1129  TempSrc: Oral  PainSc: 0-No pain      Patients Stated Pain Goal: 3 (16/10/96 0454)  Complications: No notable events documented.

## 2022-01-17 NOTE — Discharge Instructions (Signed)
[facility]      [facility] °[phone] ° °OPEN ABDOMINAL SURGERY: POST OP INSTRUCTIONS ° °Always review your discharge instruction sheet given to you by the facility where your surgery was performed. ° °IF YOU HAVE DISABILITY OR FAMILY LEAVE FORMS, YOU MUST BRING THEM TO THE OFFICE FOR PROCESSING.  PLEASE DO NOT GIVE THEM TO YOUR DOCTOR. ° °A prescription for pain medication may be given to you upon discharge.  Take your pain medication as prescribed, if needed.  If narcotic pain medicine is not needed, then you may take acetaminophen (Tylenol) or ibuprofen (Advil) as needed. °Take your usually prescribed medications unless otherwise directed. °If you need a refill on your pain medication, please contact your pharmacy. They will contact our office to request authorization.  Prescriptions will not be filled after 5pm or on week-ends. °You should follow a light diet the first few days after arrival home, such as soup and crackers, pudding, etc.unless your doctor has advised otherwise. A high-fiber, low fat diet can be resumed as tolerated.   Be sure to include lots of fluids daily. Most patients will experience some swelling and bruising on the chest and neck area.  Ice packs will help.  Swelling and bruising can take several days to resolve °Most patients will experience some swelling and bruising in the area of the incision. Ice pack will help. Swelling and bruising can take several days to resolve..  °It is common to experience some constipation if taking pain medication after surgery.  Increasing fluid intake and taking a stool softener will usually help or prevent this problem from occurring.  A mild laxative (Milk of Magnesia or Miralax) should be taken according to package directions if there are no bowel movements after 48 hours. ° You may have steri-strips (small skin tapes) in place directly over the incision.  These strips should be left on the skin for 7-10 days.  If your surgeon used skin  glue on the incision, you may shower in 24 hours.  The glue will flake off over the next 2-3 weeks.  Any sutures or staples will be removed at the office during your follow-up visit. You may find that a light gauze bandage over your incision may keep your staples from being rubbed or pulled. You may shower and replace the bandage daily. °ACTIVITIES:  You may resume regular (light) daily activities beginning the next day--such as daily self-care, walking, climbing stairs--gradually increasing activities as tolerated.  You may have sexual intercourse when it is comfortable.  Refrain from any heavy lifting or straining until approved by your doctor. °You may drive when you no longer are taking prescription pain medication, you can comfortably wear a seatbelt, and you can safely maneuver your car and apply brakes ° °You should see your doctor in the office for a follow-up appointment approximately two weeks after your surgery.  Make sure that you call for this appointment within a day or two after you arrive home to insure a convenient appointment time. ° °WHEN TO CALL YOUR DOCTOR: °Fever over 101.0 °Inability to urinate °Nausea and/or vomiting °Extreme swelling or bruising °Continued bleeding from incision. °Increased pain, redness, or drainage from the incision. °Difficulty swallowing or breathing °Muscle cramping or spasms. °Numbness or tingling in hands or feet or around lips. ° °The clinic staff is available to answer your questions during regular business hours.  Please don’t hesitate to call and ask to speak to one of the nurses if you have concerns. ° °For   further questions, please visit [email or web address]  °

## 2022-01-17 NOTE — Anesthesia Procedure Notes (Signed)
Procedure Name: Intubation Date/Time: 01/17/2022 1:00 PM Performed by: Claudia Desanctis, CRNA Pre-anesthesia Checklist: Patient identified, Emergency Drugs available, Suction available and Patient being monitored Patient Re-evaluated:Patient Re-evaluated prior to induction Oxygen Delivery Method: Circle system utilized Preoxygenation: Pre-oxygenation with 100% oxygen Induction Type: IV induction Ventilation: Mask ventilation without difficulty Laryngoscope Size: 2 and Miller Grade View: Grade I Tube type: Oral Tube size: 6.5 mm Number of attempts: 1 Airway Equipment and Method: Stylet Placement Confirmation: ETT inserted through vocal cords under direct vision, positive ETCO2 and breath sounds checked- equal and bilateral Secured at: 21 cm Tube secured with: Tape Dental Injury: Teeth and Oropharynx as per pre-operative assessment

## 2022-01-17 NOTE — Anesthesia Preprocedure Evaluation (Signed)
Anesthesia Evaluation  Patient identified by MRN, date of birth, ID band Patient awake    Reviewed: Allergy & Precautions, NPO status , Patient's Chart, lab work & pertinent test results  History of Anesthesia Complications (+) PONV, Family history of anesthesia reaction and history of anesthetic complications  Airway Mallampati: II  TM Distance: >3 FB Neck ROM: Full    Dental  (+) Dental Advisory Given, Teeth Intact   Pulmonary neg pulmonary ROS,    Pulmonary exam normal        Cardiovascular hypertension, Normal cardiovascular exam+ Valvular Problems/Murmurs MR   Echo 01/09/2022 1. Left ventricular ejection fraction by 3D volume is 65 %. The left ventricle has normal function. The left ventricle has no regional wall motion abnormalities. There is mild concentric left ventricular hypertrophy. Left ventricular diastolic parameters are consistent with Grade I diastolic dysfunction (impaired relaxation).  2. Right ventricular systolic function is normal. The right ventricular size is normal. Tricuspid regurgitation signal is inadequate for assessing PA pressure.  3. The mitral valve is normal in structure. Mild to moderate mitral valve regurgitation. No evidence of mitral stenosis.  4. The aortic valve is normal in structure. Aortic valve regurgitation is trivial. No aortic stenosis is present.  5. The inferior vena cava is normal in size with greater than 50% respiratory variability, suggesting right atrial pressure of 3 mmHg.    Neuro/Psych PSYCHIATRIC DISORDERS Anxiety negative neurological ROS     GI/Hepatic GERD  Medicated,  Endo/Other  Hypothyroidism   Renal/GU   negative genitourinary   Musculoskeletal  (+) Arthritis ,   Abdominal Normal abdominal exam  (+)   Peds  Hematology  (+) Blood dyscrasia, anemia ,   Anesthesia Other Findings   Reproductive/Obstetrics                              Anesthesia Physical  Anesthesia Plan  ASA: 2  Anesthesia Plan: General   Post-op Pain Management: GA combined w/ Regional for post-op pain and Minimal or no pain anticipated   Induction: Intravenous  PONV Risk Score and Plan: 3 and Propofol infusion, TIVA and Treatment may vary due to age or medical condition  Airway Management Planned: Oral ETT  Additional Equipment: None  Intra-op Plan:   Post-operative Plan: Extubation in OR  Informed Consent: I have reviewed the patients History and Physical, chart, labs and discussed the procedure including the risks, benefits and alternatives for the proposed anesthesia with the patient or authorized representative who has indicated his/her understanding and acceptance.     Dental advisory given  Plan Discussed with: CRNA  Anesthesia Plan Comments:         Anesthesia Quick Evaluation

## 2022-01-17 NOTE — Progress Notes (Signed)
[facility]                                                                               Tasha Moss, is a 86 y.o. female, DOB - 07-19-1936, HCW:[id number] Admit date - 01/07/2022    Outpatient Primary MD for the patient is [name], MD  LOS - 9  days    Brief summary   86 yo F with hx BPPV, hypertension, HLD, hypothyroidism s/p partial thyroidectomy, GERD, esophageal stricture who presented with generalized weakness.  Sounds like was admitted with closed left hip dislocation s/p reduction in November and was discharged to SNF.  She caught COVID in rehab and has subsequently developed progressive vertigo with nausea and vomiting.  She's also had progressive dysphagia and issues with passage of solid food.  She had barium swallow on 1/19 where she aspirated into the trachea and plan was for MBS at [facility] this week.  She continued to have dizziness with vertigo and N/V.  She's developed progressive weakness.  She also had worsening edema and imaging concerning for HF.  She's been admitted with failure to thrive.    01/17/22 :   Pending PEG tube placement today    Assessment & Plan    Assessment and Plan: * Failure to thrive in adult- (present on admission) - Multifactorial, related to vertigo, dysphagia, poor nutritional status   Malnutrition of moderate degree - Multifactorial, likely due to oropharyngeal dysphagia, poor nutritional status, chronic illness  Sacral decubitus ulcer, stage II (HCC) - Continue wound care consult recommendations  Acute CHF (congestive heart failure) (HCC) -CT with CHF, cardiomegaly, pulm vascular prominence, trace effusions, interstitial edema, possibly early alveolar edema.  Received Lasix x1. -Appears to be compensated and euvolemic -2D echo 2/1 showed EF of 65%, G1 DD  Dysphagia - Followed outpatient by GI, underwent upper GI series as outpatient on 1/19 with aspiration of contrast into trachea at the level of mainstem  bronchi.  -SLP consulted.  MBS showed pharyngeal esophageal and pharyngeal phase dysphagia-chronic due to position of cervical spine with exacerbation related to her current illness/deconditioning -Recommended full liquid, nectar thick diet. -EGD on 2/3, esophageal dilation.  Recommended PEG tube placement -Palliative medicine consulted and patient, family wishes to proceed with PEG -Plan for PEG tube placement by general surgery today   Benign paroxysmal positional vertigo- (present on admission) - Continue scopolamine patch, avoid Dramamine due to anticholinergic side effects.   -CT head on 1/30 head showed no acute abnormality.   -Continue PT OT vestibular rehab  Hypothyroidism- (present on admission) - TSH mildly elevated -Follow-up outpatient after acute illness is resolved   Code Status full CODE STATUS DVT Prophylaxis:  enoxaparin (LOVENOX) injection 40 mg Start: 01/07/22 2200   Level of Care: Level of care: Telemetry Family Communication: Updated patient's family member at the bedside  Disposition Plan:     Remains inpatient appropriate: Pending PEG tube placement today  Procedures:  EGD  Consultants:    IR General surgery Gastroenterology Palliative medicine   Antimicrobials:   Anti-infectives (From admission, onward)    Start     Dose/Rate Route Frequency Ordered Stop   01/17/22 1245  [  MAR Hold]  ceFAZolin (ANCEF) IVPB 2g/100 mL premix        (MAR Hold since Thu 01/17/2022 at 1121.Hold Reason: Transfer to a Procedural area)   2 g 200 mL/hr over 30 Minutes Intravenous  Once 01/16/22 1153 01/17/22 1303   01/15/22 1548  ceFAZolin (ANCEF) 2-4 GM/100ML-% IVPB  Status:  Discontinued       Note to Pharmacy: Lesia Hausen: cabinet override      01/15/22 1548 01/15/22 1618   01/15/22 1400  ceFAZolin (ANCEF) IVPB 2g/100 mL premix        2 g 200 mL/hr over 30 Minutes Intravenous To Radiology 01/13/22 0933 01/16/22 1400   01/13/22 2200  cephALEXin (KEFLEX) capsule 500  mg        500 mg Oral Every 12 hours 01/13/22 1347 01/14/22 0950   01/09/22 1630  cephALEXin (KEFLEX) capsule 500 mg  Status:  Discontinued        500 mg Oral Every 12 hours 01/09/22 1531 01/13/22 1347        Medications  Scheduled Meds:  [MAR Hold] cholecalciferol  1,000 Units Oral Daily   [MAR Hold] enoxaparin (LOVENOX) injection  40 mg Subcutaneous Q24H   [MAR Hold] feeding supplement  237 mL Oral TID BM   [START ON 01/18/2022] feeding supplement (PROSource TF)  45 mL Per Tube Daily   fentaNYL       [MAR Hold] FLUoxetine  20 mg Oral Daily   [START ON 01/18/2022] free water  100 mL Per Tube Q4H   [MAR Hold] furosemide  40 mg Intravenous Once   [MAR Hold] levothyroxine  125 mcg Oral QAC breakfast   [MAR Hold] loratadine  10 mg Oral Daily   [MAR Hold] multivitamin with minerals  1 tablet Oral Daily   [MAR Hold] ondansetron (ZOFRAN) IV  4 mg Intravenous Once   [MAR Hold] pantoprazole  40 mg Oral BID   [MAR Hold] polyethylene glycol  17 g Oral Daily   [MAR Hold] polyvinyl alcohol  2 drop Both Eyes Daily   [MAR Hold] scopolamine  1 patch Transdermal Q72H   [MAR Hold] thiamine  100 mg Oral Daily   [MAR Hold] vitamin B-12  250 mcg Oral Daily   Continuous Infusions:  acetaminophen     acetaminophen 1,000 mg (01/17/22 1423)   [START ON 01/18/2022] feeding supplement (OSMOLITE 1.5 CAL)     lactated ringers 50 mL/hr at 01/17/22 1246   PRN Meds:.acetaminophen, [MAR Hold] acetaminophen, amisulpride, fentaNYL (SUBLIMAZE) injection, fentaNYL (SUBLIMAZE) injection, ondansetron (ZOFRAN) IV, [MAR Hold] ondansetron, oxyCODONE    Subjective:   Tasha Moss was seen and examined today.  Sitting up in the chair, family at the bedside.  Awaiting PEG tube placement at the time of my examination.  No acute complaints.   Objective:   Vitals:   01/17/22 0500 01/17/22 1129 01/17/22 1415 01/17/22 1430  BP:  140/79 127/85 (!) 153/64  Pulse:  92 90 88  Resp:  15 19 13   Temp:  98.1 F (36.7 C)  98.3 F (36.8 C)   TempSrc:  Oral    SpO2:   100% 96%  Weight: 55.3 kg     Height:        Intake/Output Summary (Last 24 hours) at 01/17/2022 1507 Last data filed at 01/17/2022 1402 Gross per 24 hour  Intake 600 ml  Output 5 ml  Net 595 ml   Filed Weights   01/15/22 0500 01/16/22 0416 01/17/22 0500  Weight: 61.2 kg 55.3 kg 55.3  kg     Exam General: Alert and oriented x 3, NAD Cardiovascular: S1 S2 auscultated, no murmurs, RRR Respiratory: Clear to auscultation bilaterally, no wheezing Gastrointestinal: Soft, nontender, nondistended, + bowel sounds Ext: no pedal edema bilaterally Neuro: no new deficits  Data Reviewed:  I have personally reviewed following labs and imaging studies   CBC Lab Results  Component Value Date   WBC 5.8 01/16/2022   RBC 3.94 01/16/2022   HGB 12.4 01/16/2022   HCT 37.3 01/16/2022   MCV 94.7 01/16/2022   MCH 31.5 01/16/2022   PLT 253 01/16/2022   MCHC 33.2 01/16/2022   RDW 14.7 01/16/2022   LYMPHSABS 1.2 01/09/2022   MONOABS 0.7 01/09/2022   EOSABS 0.2 01/09/2022   BASOSABS 0.0 66/59/9357     Last metabolic panel Lab Results  Component Value Date   NA 133 (L) 01/16/2022   K 3.9 01/16/2022   CL 95 (L) 01/16/2022   CO2 31 01/16/2022   BUN 12 01/16/2022   CREATININE 0.53 01/16/2022   GLUCOSE 100 (H) 01/16/2022   GFRNONAA >60 01/16/2022   GFRAA >60 02/19/2016   CALCIUM 8.7 (L) 01/16/2022   PHOS 4.3 01/17/2022   PROT 5.5 (L) 01/16/2022   ALBUMIN 2.8 (L) 01/16/2022   BILITOT 0.4 01/16/2022   ALKPHOS 62 01/16/2022   AST 19 01/16/2022   ALT 17 01/16/2022   ANIONGAP 7 01/16/2022    CBG (last 3)  Recent Labs    01/16/22 2333 01/17/22 0412 01/17/22 0739  GLUCAP 130* 102* 122*       Radiology Studies: IR GASTROSTOMY TUBE MOD SED  Result Date: 01/15/2022 INDICATION: 86 year old woman with dysphagia esophageal stricture presents to IR for gastrostomy tube placement. IMPRESSION: Unable to identify safe percutaneous window for  G-tube placement due to superimposed colon. Electronically Signed   By: Miachel Roux M.D.   On: 01/15/2022 16:44       Taunja Brickner M.D. [facility] 01/17/2022, 3:07 PM  Available via Epic secure chat 7am-7pm After 7 pm, please refer to night coverage provider listed on amion.

## 2022-01-18 ENCOUNTER — Other Ambulatory Visit: Payer: Self-pay

## 2022-01-18 ENCOUNTER — Encounter (HOSPITAL_COMMUNITY): Payer: Self-pay | Admitting: Surgery

## 2022-01-18 LAB — MAGNESIUM
Magnesium: 1.7 mg/dL (ref 1.7–2.4)
Magnesium: 1.7 mg/dL (ref 1.7–2.4)

## 2022-01-18 LAB — PHOSPHORUS
Phosphorus: 6 mg/dL — ABNORMAL HIGH (ref 2.5–4.6)
Phosphorus: 3.9 mg/dL (ref 2.5–4.6)

## 2022-01-18 LAB — GLUCOSE, CAPILLARY
Glucose-Capillary: 103 mg/dL — ABNORMAL HIGH (ref 70–99)
Glucose-Capillary: 129 mg/dL — ABNORMAL HIGH (ref 70–99)
Glucose-Capillary: 132 mg/dL — ABNORMAL HIGH (ref 70–99)
Glucose-Capillary: 132 mg/dL — ABNORMAL HIGH (ref 70–99)
Glucose-Capillary: 136 mg/dL — ABNORMAL HIGH (ref 70–99)

## 2022-01-18 MED ORDER — UNABLE TO FIND
0 refills | Status: DC
Start: 2022-01-18 — End: 2023-08-25

## 2022-01-18 MED ORDER — OSMOLITE 1.5 CAL PO LIQD
1000.0000 mL | ORAL | Status: DC
  Filled 2022-01-18: qty 1000

## 2022-01-18 MED ORDER — OSMOLITE 1.5 CAL PO LIQD
1000.0000 mL | ORAL | Status: DC
  Administered 2022-01-18: 1000 mL
  Filled 2022-01-18 (×2): qty 1000

## 2022-01-18 NOTE — Progress Notes (Signed)
SLP Cancellation Note  Patient Details Name: Tasha Moss MRN: [id number] DOB: 07/30/1936   Cancelled treatment:       Reason Eval/Treat Not Completed: Other (comment) (pt not currently back on diet per orders, Gtube to gravity per chart review, will continue efforts)  Kathleen Lime, MS [place] Office [phone] Cell [phone]  Macario Golds 01/18/2022, 1:44 PM

## 2022-01-18 NOTE — Progress Notes (Signed)
NUTRITION NOTE  Able to talk with Carolynn Sayers, rep for [employer], on the phone concerning plan for d/c home tomorrow.  National shortage for [employer] until at least the end of the month.   Plan for Osmolite 1.5 @ 20 ml/hr x24 hours (1 L bottle in patient's room at the time of RD visit earlier today). Will then transition to bolus TF regimen.  Transition tomorrow to 1/2 carton (118 ml) Jevity 1.5 QID with 60 ml free water before and 60 ml free water after each TF bolus and 45 ml Prosource TF (or equivalent) BID.   Maintain this regimen x4 days.   Then increase to 1 carton (237 ml) Jevity 1.5 QID with 60 ml free water before and 60 ml free water after each TF bolus and 45 ml Prosource TF (or equivalent) BID.   This goal regimen will provide 1500 kcal (86% kcal need), 82 grams protein (96% protein need), and 1200 ml free water.     Estimated Nutritional Needs:  Kcal:  1750-1950 kcal Protein:  85-95 grams Fluid:  >/= 1.9 L/day    Jarome Matin, MS, RD, LDN Inpatient Clinical Dietitian RD pager # available in [place]  After hours/weekend pager # available in [facility]

## 2022-01-18 NOTE — Progress Notes (Signed)
1 Day Post-Op  Subjective: CC: S/p open g-tube placement yesterday by Dr. Brantley Stage. Husband at bedside. Patient awake and alert this morning. Denies any abdominal pain, n/v. G-tube is currently to gravity.  Objective: Vital signs in last 24 hours: Temp:  [97.8 F (36.6 C)-98.3 F (36.8 C)] 97.8 F (36.6 C) (02/10 0456) Pulse Rate:  [81-95] 81 (02/10 0456) Resp:  [13-20] 18 (02/10 0456) BP: (101-153)/(54-85) 109/54 (02/10 0456) SpO2:  [96 %-100 %] 98 % (02/10 0456) Last BM Date: 01/11/22  Intake/Output from previous day: 02/09 0701 - 02/10 0700 In: 798.3 [I.V.:598.3; IV Piggyback:200] Out: 405 [Urine:300; Drains:100; Blood:5] Intake/Output this shift: No intake/output data recorded.  PE: Gen:  Alert, NAD, pleasant Abd: Soft, ND, NT +BS, incisions with glue intact appears well and are without drainage, bleeding, or signs of infection. G-tube w/ some dried blood on dressing. This was removed and there was a small amount oozing noted  noted on the inferior aspect of the retention disk. After this was wiped away the area was observed for several minutes and remained hemostatic. No further drainage. Dressing replaced.    Lab Results:  Recent Labs    01/16/22 0352  WBC 5.8  HGB 12.4  HCT 37.3  PLT 253   BMET Recent Labs    01/16/22 0352  NA 133*  K 3.9  CL 95*  CO2 31  GLUCOSE 100*  BUN 12  CREATININE 0.53  CALCIUM 8.7*   PT/INR No results for input(s): LABPROT, INR in the last 72 hours. CMP     Component Value Date/Time   NA 133 (L) 01/16/2022 0352   NA 142 02/19/2016 0000   K 3.9 01/16/2022 0352   CL 95 (L) 01/16/2022 0352   CO2 31 01/16/2022 0352   GLUCOSE 100 (H) 01/16/2022 0352   BUN 12 01/16/2022 0352   BUN 13 02/19/2016 0000   CREATININE 0.53 01/16/2022 0352   CREATININE 0.56 (L) 11/10/2019 0931   CALCIUM 8.7 (L) 01/16/2022 0352   PROT 5.5 (L) 01/16/2022 0352   ALBUMIN 2.8 (L) 01/16/2022 0352   AST 19 01/16/2022 0352   ALT 17 01/16/2022 0352    ALKPHOS 62 01/16/2022 0352   BILITOT 0.4 01/16/2022 0352   GFRNONAA >60 01/16/2022 0352   GFRAA >60 02/19/2016 1304   Lipase  No results found for: LIPASE  Studies/Results: No results found.  Anti-infectives: Anti-infectives (From admission, onward)    Start     Dose/Rate Route Frequency Ordered Stop   01/17/22 1245  ceFAZolin (ANCEF) IVPB 2g/100 mL premix        2 g 200 mL/hr over 30 Minutes Intravenous  Once 01/16/22 1153 01/17/22 1303   01/15/22 1548  ceFAZolin (ANCEF) 2-4 GM/100ML-% IVPB  Status:  Discontinued       Note to Pharmacy: Lesia Hausen: cabinet override      01/15/22 1548 01/15/22 1618   01/15/22 1400  ceFAZolin (ANCEF) IVPB 2g/100 mL premix        2 g 200 mL/hr over 30 Minutes Intravenous To Radiology 01/13/22 0933 01/16/22 1400   01/13/22 2200  cephALEXin (KEFLEX) capsule 500 mg        500 mg Oral Every 12 hours 01/13/22 1347 01/14/22 0950   01/09/22 1630  cephALEXin (KEFLEX) capsule 500 mg  Status:  Discontinued        500 mg Oral Every 12 hours 01/09/22 1531 01/13/22 1347        Assessment/Plan POD 1 s/p Placement of the 24  [place] Stamm gastrostomy by Dr. Brantley Stage for severe protein calorie malnutrition and dysphagia - Okay to start TF's. Will reach out to primary team - Abd binder - Flush tube with 30 ml (20 ml if fluid restricted) sterile water every 4 hours, before and after medication administration, and when continuous feeding is interrupted. - We will check on tomorrow am to ensure she is doing well with TF initiation - Follow up arranged  FEN - Okay for TF's VTE - SCDs, Lovenox ID - Ancef peri-op  Esophageal stricture Severe protein calorie malnutrition  Generalized weakness/FTT HTN HLD GERD BPPV Hypothyroidism s/p partial thyroidectomy  Chronic diastolic CHF Sacral ulcer    This care is post operative   LOS: 10 days    Tasha Moss , [facility] 01/18/2022, 8:36 AM Please see Amion for pager number  during day hours 7:00am-4:30pm

## 2022-01-18 NOTE — Progress Notes (Signed)
[facility]                                                                               Tasha Moss, is a 86 y.o. female, DOB - [birth year], XLK:[id number] Admit date - 01/07/2022    Outpatient Primary MD for the patient is [name], MD  LOS - 10  days    Brief summary   86 yo F with hx BPPV, hypertension, HLD, hypothyroidism s/p partial thyroidectomy, GERD, esophageal stricture who presented with generalized weakness.  Sounds like was admitted with closed left hip dislocation s/p reduction in November and was discharged to SNF.  She caught COVID in rehab and has subsequently developed progressive vertigo with nausea and vomiting.  She's also had progressive dysphagia and issues with passage of solid food.  She had barium swallow on 1/19 where she aspirated into the trachea and plan was for MBS at [facility] this week.  She continued to have dizziness with vertigo and N/V.  She's developed progressive weakness.  She also had worsening edema and imaging concerning for HF.  She's been admitted with failure to thrive.    01/17/22 :   Pending PEG tube placement today    Assessment & Plan    Assessment and Plan: * Failure to thrive in adult- (present on admission) - Multifactorial, related to vertigo, dysphagia, poor nutritional status   Malnutrition of moderate degree - Multifactorial, likely due to oropharyngeal dysphagia, poor nutritional status, chronic illness -Start tube feeds with free water  Sacral decubitus ulcer, stage II ([place]) - Continue wound care consult recommendations  Acute CHF (congestive heart failure) (HCC) -CT with CHF, cardiomegaly, pulm vascular prominence, trace effusions, interstitial edema, possibly early alveolar edema.  Received Lasix x1. -Appears to be compensated and euvolemic -2D echo 2/1 showed EF of 65%, G1 DD  Dysphagia - Followed outpatient by GI, underwent upper GI series as outpatient on 1/19 with aspiration of contrast  into trachea at the level of mainstem bronchi.  -SLP consulted.  MBS showed pharyngeal esophageal and pharyngeal phase dysphagia-chronic due to position of cervical spine with exacerbation related to her current illness/deconditioning -Recommended full liquid, nectar thick diet. -EGD on 2/3, esophageal dilation.  Recommended PEG. -Palliative medicine consulted and patient, family wishes to proceed with PEG -Underwent PEG placement on 2/9, okay to start tube feeds  -Dietitian consult for tube feeds   Benign paroxysmal positional vertigo- (present on admission) - Continue scopolamine patch, avoid Dramamine due to anticholinergic side effects.   -CT head on 1/30 head showed no acute abnormality.   -Continue PT OT vestibular rehab  Hypothyroidism- (present on admission) - TSH mildly elevated -Follow-up outpatient after acute illness is resolved  Pressure Injury Documentation: Pressure Injury 01/07/22 Perineum Right (Active)  01/07/22 2130  Location: Perineum  Location Orientation: Right  Staging:   Wound Description (Comments):   Present on Admission: Yes     Code Status: Full CODE STATUS DVT Prophylaxis:  enoxaparin (LOVENOX) injection 40 mg Start: 01/07/22 2200   Level of Care: Level of care: Telemetry Family Communication: Updated patient's husband at the bedside  Disposition Plan:     Remains inpatient appropriate: Starting tube  feeds today  Procedures:  PEG tube placement  Consultants:   General surgery IR GI  Antimicrobials:   Anti-infectives (From admission, onward)    Start     Dose/Rate Route Frequency Ordered Stop   01/17/22 1245  ceFAZolin (ANCEF) IVPB 2g/100 mL premix        2 g 200 mL/hr over 30 Minutes Intravenous  Once 01/16/22 1153 01/17/22 1303   01/15/22 1548  ceFAZolin (ANCEF) 2-4 GM/100ML-% IVPB  Status:  Discontinued       Note to Pharmacy: Lesia Hausen: cabinet override      01/15/22 1548 01/15/22 1618   01/15/22 1400  ceFAZolin (ANCEF) IVPB  2g/100 mL premix        2 g 200 mL/hr over 30 Minutes Intravenous To Radiology 01/13/22 0933 01/16/22 1400   01/13/22 2200  cephALEXin (KEFLEX) capsule 500 mg        500 mg Oral Every 12 hours 01/13/22 1347 01/14/22 0950   01/09/22 1630  cephALEXin (KEFLEX) capsule 500 mg  Status:  Discontinued        500 mg Oral Every 12 hours 01/09/22 1531 01/13/22 1347        Medications  Scheduled Meds:  cholecalciferol  1,000 Units Oral Daily   enoxaparin (LOVENOX) injection  40 mg Subcutaneous Q24H   feeding supplement  237 mL Oral TID BM   feeding supplement (OSMOLITE 1.5 CAL)  1,000 mL Per Tube Q24H   feeding supplement (PROSource TF)  45 mL Per Tube Daily   FLUoxetine  20 mg Oral Daily   free water  100 mL Per Tube Q4H   ketorolac  15 mg Intravenous Q6H   levothyroxine  125 mcg Oral QAC breakfast   loratadine  10 mg Oral Daily   multivitamin with minerals  1 tablet Oral Daily   pantoprazole  40 mg Oral BID   polyethylene glycol  17 g Oral Daily   polyvinyl alcohol  2 drop Both Eyes Daily   scopolamine  1 patch Transdermal Q72H   thiamine  100 mg Oral Daily   vitamin B-12  250 mcg Oral Daily   Continuous Infusions:  lactated ringers 50 mL/hr at 01/17/22 2306   PRN Meds:.acetaminophen, fentaNYL (SUBLIMAZE) injection, ondansetron (ZOFRAN) IV, ondansetron, oxyCODONE    Subjective:   Tasha Moss was seen and examined today.  No acute complaints.  PEG tube placed on 2/9.  Patient denies dizziness, chest pain, shortness of breath, abdominal pain, N/V. No acute events overnight.    Objective:   Vitals:   01/17/22 1430 01/17/22 1445 01/17/22 2118 01/18/22 0456  BP: (!) 153/64 (!) 153/68 (!) 101/58 (!) 109/54  Pulse: 88 95 90 81  Resp: 13 20 18 18   Temp:   98.1 F (36.7 C) 97.8 F (36.6 C)  TempSrc:   Oral Oral  SpO2: 96% 96% 96% 98%  Weight:      Height:        Intake/Output Summary (Last 24 hours) at 01/18/2022 1354 Last data filed at 01/18/2022 0400 Gross per 24 hour   Intake 698.33 ml  Output 400 ml  Net 298.33 ml   Filed Weights   01/15/22 0500 01/16/22 0416 01/17/22 0500  Weight: 61.2 kg 55.3 kg 55.3 kg     Exam General: Alert and oriented x 3, NAD Cardiovascular: S1 S2 auscultated, no murmurs, RRR Respiratory: Clear to auscultation bilaterally, no wheezing, rales or rhonchi Gastrointestinal: Soft, nontender, nondistended, + bowel sounds, PEG tube+ Ext: no pedal edema bilaterally Neuro:  no new deficits   Data Reviewed:  I have personally reviewed following labs and imaging studies   CBC Lab Results  Component Value Date   WBC 5.8 01/16/2022   RBC 3.94 01/16/2022   HGB 12.4 01/16/2022   HCT 37.3 01/16/2022   MCV 94.7 01/16/2022   MCH 31.5 01/16/2022   PLT 253 01/16/2022   MCHC 33.2 01/16/2022   RDW 14.7 01/16/2022   LYMPHSABS 1.2 01/09/2022   MONOABS 0.7 01/09/2022   EOSABS 0.2 01/09/2022   BASOSABS 0.0 59/45/8592     Last metabolic panel Lab Results  Component Value Date   NA 133 (L) 01/16/2022   K 3.9 01/16/2022   CL 95 (L) 01/16/2022   CO2 31 01/16/2022   BUN 12 01/16/2022   CREATININE 0.53 01/16/2022   GLUCOSE 100 (H) 01/16/2022   GFRNONAA >60 01/16/2022   GFRAA >60 02/19/2016   CALCIUM 8.7 (L) 01/16/2022   PHOS 6.0 (H) 01/18/2022   PROT 5.5 (L) 01/16/2022   ALBUMIN 2.8 (L) 01/16/2022   BILITOT 0.4 01/16/2022   ALKPHOS 62 01/16/2022   AST 19 01/16/2022   ALT 17 01/16/2022   ANIONGAP 7 01/16/2022    CBG (last 3)  Recent Labs    01/18/22 0050 01/18/22 0459 01/18/22 1157  GLUCAP 132* 103* 129*      Coagulation Profile: No results for input(s): INR, PROTIME in the last 168 hours.   Radiology Studies: No results found.     Estill Cotta M.D. [facility] 01/18/2022, 1:54 PM  Available via Epic secure chat 7am-7pm After 7 pm, please refer to night coverage provider listed on amion.

## 2022-01-18 NOTE — Progress Notes (Signed)
Physical Therapy Treatment Patient Details Name: Tasha Moss MRN: [id number] DOB: [birth year] Today's Date: 01/18/2022   History of Present Illness Patient is 86 y.o. female presented to [facility] for progressive weakness, dizziness, vertigo, and N/V. patient recently admitted for Lt hip dislocation in November 2022 and discharge to SNF where she caught COVID and ifrst developed vertigo symptoms. PMH signifnicant for HLD, HTN, BPPV, hypothyroidism, thyroidectomy, GERD, anxiety, OA, Lt TSA, bil THA in 1990's.    PT Comments    Pt is AxO x 3 very pleasant with very supportive Spouse.  Assisted OOB to amb pt did well.  General bed mobility comments: increased assist this session due to ABD pain/discomfort (new PEG palcement 01/17/22) General transfer comment: increased assist to rise from bed with increased time but self able 75%.  General Gait Details: tolerated a functional distance.  Slow but steady.  Slightly forward flex posture.  Good use of walker. Positioned in recliner to comfort and applied heat to neck. Pt plans to D/C to home in the next couple of days with family support.   Recommendations for follow up therapy are one component of a multi-disciplinary discharge planning process, led by the attending physician.  Recommendations may be updated based on patient status, additional functional criteria and insurance authorization.  Follow Up Recommendations  Home health PT     Assistance Recommended at Discharge Frequent or constant Supervision/Assistance  Patient can return home with the following A little help with walking and/or transfers;A little help with bathing/dressing/bathroom;Assistance with cooking/housework;Direct supervision/assist for medications management;Assist for transportation;Help with stairs or ramp for entrance   Equipment Recommendations  None recommended by PT    Recommendations for Other Services       Precautions / Restrictions Precautions Precautions:  Fall Precaution Comments: PEG placenment 01/17/2022     Mobility  Bed Mobility Overal bed mobility: Needs Assistance Bed Mobility: Supine to Sit     Supine to sit: HOB elevated, Min assist     General bed mobility comments: increased assist this session due to ABD pain/discomfort (new PEG palcement 01/17/22)    Transfers Overall transfer level: Needs assistance Equipment used: Rolling walker (2 wheels) Transfers: Sit to/from Stand Sit to Stand: Min assist           General transfer comment: increased assist to rise from bed with increased time but self able 75%    Ambulation/Gait Ambulation/Gait assistance: Min guard, Min assist Gait Distance (Feet): 38 Feet Assistive device: Rolling walker (2 wheels) Gait Pattern/deviations: Step-through pattern, Decreased stride length, Trunk flexed Gait velocity: decreased     General Gait Details: tolerated a functional distance.  Slow but steady.  Slightly forward flex posture.  Good use of walker.   Stairs             Wheelchair Mobility    Modified Rankin (Stroke Patients Only)       Balance                                            Cognition Arousal/Alertness: Awake/alert Behavior During Therapy: WFL for tasks assessed/performed Overall Cognitive Status: Within Functional Limits for tasks assessed                                 General Comments: AxO x 3 very HOH.  Sweet.  Motivated.  Very supportive Spouse always with her.        Exercises      General Comments        Pertinent Vitals/Pain Pain Assessment Pain Assessment: Faces Faces Pain Scale: Hurts a little bit Pain Location: ABD (PEG site) Pain Descriptors / Indicators: Discomfort, Sore Pain Intervention(s): Monitored during session    Home Living                          Prior Function            PT Goals (current goals can now be found in the care plan section) Progress towards PT goals:  Progressing toward goals    Frequency    Min 3X/week      PT Plan Current plan remains appropriate    Co-evaluation              AM-PAC PT "6 Clicks" Mobility   Outcome Measure  Help needed turning from your back to your side while in a flat bed without using bedrails?: A Little Help needed moving from lying on your back to sitting on the side of a flat bed without using bedrails?: A Little Help needed moving to and from a bed to a chair (including a wheelchair)?: A Little Help needed standing up from a chair using your arms (e.g., wheelchair or bedside chair)?: A Little Help needed to walk in hospital room?: A Little Help needed climbing 3-5 steps with a railing? : A Little 6 Click Score: 18    End of Session Equipment Utilized During Treatment: Gait belt Activity Tolerance: Patient tolerated treatment well Patient left: in chair;with call bell/phone within reach;with chair alarm set;with family/visitor present Nurse Communication: Mobility status PT Visit Diagnosis: Unsteadiness on feet (R26.81);Other abnormalities of gait and mobility (R26.89);Muscle weakness (generalized) (M62.81);Difficulty in walking, not elsewhere classified (R26.2) BPPV - Right/Left : Right     Time: 1103-1130 PT Time Calculation (min) (ACUTE ONLY): 27 min  Charges:  $Gait Training: 8-22 mins $Therapeutic Activity: 8-22 mins                     {Pryce Folts  PTA Acute  Rehabilitation Services Pager      [phone] Office      [phone]

## 2022-01-18 NOTE — TOC Progression Note (Signed)
Transition of Care [facility]) - Progression Note    Patient Details  Name: DONETA BAYMAN MRN: [id number] Date of Birth: 10/05/1936  [facility] CM/SW Contact  Leeroy Cha, RN Phone Number: 01/18/2022, 12:17 PM  Clinical Narrative:    Francesco Runner feeding for home sent to [place] with [employer], she is contacting [employer] for the needed dme and will outsource the RN for the home tube feedings.   Expected Discharge Plan: [place] Barriers to Discharge: Continued Medical Work up  Expected Discharge Plan and Services Expected Discharge Plan: [place]   Discharge Planning Services: CM Consult   Living arrangements for the past 2 months: Single Family Home                 DME Arranged: Tube feeding pump DME Agency: [employer] Date DME Agency Contacted: 01/18/22 Time DME Agency Contacted: 87   HH Arranged: RN HH Agency: [employer] Date HH Agency Contacted: 01/18/22 Time [place]: 1217 Representative spoke with at [place]: [place] Interventions    Readmission Risk Interventions No flowsheet data found.

## 2022-01-18 NOTE — Progress Notes (Signed)
Nutrition Follow-up  DOCUMENTATION CODES:   Non-severe (moderate) malnutrition in context of chronic illness  INTERVENTION:  - will order Osmolite 1.5 @ 20 ml/hr with 45 ml Prosource TF BID and 100 ml free water every 4 hours. - this regimen will provide 800 kcal, 52 grams protein, and 966 ml free water.  - RD to re-evaluate on 2/11 for transition to bolus TF regimen.    NUTRITION DIAGNOSIS:   Moderate Malnutrition related to chronic illness, dysphagia as evidenced by moderate fat depletion, moderate muscle depletion. -ongoing  GOAL:   Patient will meet greater than or equal to 90% of their needs -to be met with goal TF regimen and PO intakes  MONITOR:   TF tolerance, PO intake, Supplement acceptance, Labs, Weight trends, Skin  REASON FOR ASSESSMENT:   Consult Enteral/tube feeding initiation and management  ASSESSMENT:   86 y.o. female with medical history of BPPV, HTN, HLD, hypothyroidism s/p partial thyroidectomy, GERD, and esophageal stricture. She presented to the ED due to weakness with inability to get up unassisted and progressive dysphagia. She was hospitalized in 10/2021 and underwent L total hip arthroplasty and was discharged to SNF/rehab where she contracted COVID at the beginning of 11/2021. She developed worsening vertigo and N/V with concern for cause being bilateral vestibular disorder. She had barium swallow evaluation on 12/27/21 during which she aspirated contrast. She continues to have dizziness 2/2 vertigo, N/V, and decreased intake of food and liquids.  Patient had G-tube placed by Surgery yesterday. No diet order currently entered, but patient reports drinking 1/2 bottle of Ensure with thickener this AM.   Patient laying in bed with husband at bedside. She reports some soreness around G-tube site.   Discussed with patient husband plan to initiate Osmolite 1.5 @ 20 ml/hr x24 hours and if patient is tolerating and labs from 2/11 AM are acceptable, will begin  transition to bolus feeds on 2/11.  Weight yesterday copied forward from 2/8. Weight has been fluctuating throughout hospitalization. No information documented in the edema section of flow sheet this hospitalization. She is noted to be +913 ml since admission.    Husband asks about obtaining TF for at home. Discussed rationale for current formula and that TOC will assist with setting patient up with Home Health/DME agency who will aid in deliver of TF and supplies for feeding to patient's home. Discussed that insurance is unlikely to cover TF.   Patient and husband grateful for plan for patient to go home on bolus feeds so that she can move about the home and do what she would like rather than being connected to a pump.   Labs reviewed; CBGs: 132, 103, 129 mg/dl, Mg: 1.7 mg/dl, K: 3.9 mmol/l, Na: 133 mmol/l, Cl: 95 mmol/l, Ca: 8.7 mg/dl.   Phos today is 6 mg/dl, yesterday was 5 mg/dl in PM and 4.3 mg/dl in AM, on 2/8 was 3.3 mg/dl in the PM and 4.7 mg/dl, in the AM.   Medications reviewed; 125 mcg oral synthroid/day, 1 tablet multivitamin with minerals/day, 40 mg oral protonix BID, 17 g miralax/day, 100 mg oral thiamine/day, 250 mcg oral cyanocobalamin/day.   IVF; LR @ 50 ml/hr.   Diet Order:   Diet Order     None       EDUCATION NEEDS:   Education needs have been addressed  Skin:  Skin Assessment: Skin Integrity Issues: Skin Integrity Issues:: Stage II Stage II: sacrum (per MD note on 1/31)  Last BM:  2/3  Height:  Ht Readings from Last 1 Encounters:  01/07/22 5' 5" (1.651 m)    Weight:   Wt Readings from Last 1 Encounters:  01/17/22 55.3 kg     BMI:  Body mass index is 20.29 kg/m.   Estimated Nutritional Needs:  Kcal:  1750-1950 kcal Protein:  85-95 grams Fluid:  >/= 1.9 L/day     Jarome Matin, MS, RD, LDN Inpatient Clinical Dietitian RD pager # available in [place]  After hours/weekend pager # available in [facility]

## 2022-01-19 DIAGNOSIS — E02 Subclinical iodine-deficiency hypothyroidism: Secondary | ICD-10-CM

## 2022-01-19 DIAGNOSIS — I5041 Acute combined systolic (congestive) and diastolic (congestive) heart failure: Secondary | ICD-10-CM

## 2022-01-19 DIAGNOSIS — R6339 Other feeding difficulties: Secondary | ICD-10-CM

## 2022-01-19 LAB — PHOSPHORUS
Phosphorus: 3.5 mg/dL (ref 2.5–4.6)
Phosphorus: 2.6 mg/dL (ref 2.5–4.6)

## 2022-01-19 LAB — GLUCOSE, CAPILLARY
Glucose-Capillary: 120 mg/dL — ABNORMAL HIGH (ref 70–99)
Glucose-Capillary: 124 mg/dL — ABNORMAL HIGH (ref 70–99)
Glucose-Capillary: 134 mg/dL — ABNORMAL HIGH (ref 70–99)
Glucose-Capillary: 81 mg/dL (ref 70–99)

## 2022-01-19 LAB — MAGNESIUM
Magnesium: 1.5 mg/dL — ABNORMAL LOW (ref 1.7–2.4)
Magnesium: 1.5 mg/dL — ABNORMAL LOW (ref 1.7–2.4)

## 2022-01-19 MED ORDER — MAGNESIUM SULFATE 2 GM/50ML IV SOLN
2.0000 g | Freq: Once | INTRAVENOUS | Status: AC
  Administered 2022-01-19: 2 g via INTRAVENOUS
  Filled 2022-01-19: qty 50

## 2022-01-19 MED ORDER — LOPERAMIDE HCL 2 MG PO CAPS
2.0000 mg | ORAL_CAPSULE | ORAL | Status: DC | PRN
  Administered 2022-01-19 – 2022-01-22 (×2): 2 mg via ORAL
  Filled 2022-01-19 (×2): qty 1

## 2022-01-19 MED ORDER — HYDROXYZINE HCL 25 MG PO TABS
25.0000 mg | ORAL_TABLET | Freq: Once | ORAL | Status: AC
  Administered 2022-01-19: 25 mg via ORAL
  Filled 2022-01-19: qty 1

## 2022-01-19 MED ORDER — FREE WATER
180.0000 mL | Freq: Four times a day (QID) | Status: DC
  Administered 2022-01-19 (×3): 180 mL

## 2022-01-19 MED ORDER — MAGNESIUM SULFATE 50 % IJ SOLN: 3.0000 g | Freq: Once | INTRAVENOUS | Status: DC

## 2022-01-19 MED ORDER — JEVITY 1.5 CAL/FIBER PO LIQD
118.0000 mL | Freq: Four times a day (QID) | ORAL | Status: DC
  Administered 2022-01-19 (×3): 118 mL
  Filled 2022-01-19 (×4): qty 237

## 2022-01-19 NOTE — Progress Notes (Signed)
[facility]                                                                               Tasha Moss, is a 86 y.o. female, DOB - 05-13-1936, JOI:[id number] Admit date - 01/07/2022    Outpatient Primary MD for the patient is Tasha Minium, MD  LOS - 11  days    Brief summary   86 yo F with hx BPPV, hypertension, HLD, hypothyroidism s/p partial thyroidectomy, GERD, esophageal stricture who presented with generalized weakness.  Sounds like was admitted with closed left hip dislocation s/p reduction in November and was discharged to SNF.  She caught COVID in rehab and has subsequently developed progressive vertigo with nausea and vomiting.  She's also had progressive dysphagia and issues with passage of solid food.  She had barium swallow on 1/19 where she aspirated into the trachea and plan was for MBS at [facility] this week.  She continued to have dizziness with vertigo and N/V.  She's developed progressive weakness.  She also had worsening edema and imaging concerning for HF.  She's been admitted with failure to thrive.    PEG tube placed on 2/9, tube feeds started on 2/10   Assessment & Plan    Assessment and Plan: * Failure to thrive in adult- (present on admission) - Multifactorial, related to vertigo, dysphagia, poor nutritional status   Malnutrition of moderate degree - Multifactorial, likely due to oropharyngeal dysphagia, poor nutritional status, chronic illness -Continue tube feeds with free water  Sacral decubitus ulcer, stage II (HCC) - Continue wound care consult recommendations  Acute CHF (congestive heart failure) (HCC) -CT with CHF, cardiomegaly, pulm vascular prominence, trace effusions, interstitial edema, possibly early alveolar edema.  Received Lasix x1. -Appears to be compensated and euvolemic -2D echo 2/1 showed EF of 65%, G1 DD  Dysphagia - Followed outpatient by GI, underwent upper GI series as outpatient on 1/19 with aspiration of  contrast into trachea at the level of mainstem bronchi.  -SLP consulted.  MBS showed pharyngeal esophageal and pharyngeal phase dysphagia-chronic due to position of cervical spine with exacerbation related to her current illness/deconditioning -Recommended full liquid, nectar thick diet. -EGD on 2/3, esophageal dilation.  Recommended PEG. -Palliative medicine consulted and patient, family wishes to proceed with PEG -Underwent PEG placement on 2/9 -Tube feeds started, currently tolerating continuous feeding.  Plan to transition to bolus feeding Jevity 1.5 4 times daily with 90 mL free water before and 90 mL free water after each bolus and 45 mm Prosource TF twice daily. -Home health RN arranged   Benign paroxysmal positional vertigo- (present on admission) - Continue scopolamine patch, avoid Dramamine due to anticholinergic side effects.   -CT head on 1/30 head showed no acute abnormality.   -Continue PT OT vestibular rehab  Hypothyroidism- (present on admission) - TSH mildly elevated -Follow-up outpatient after acute illness is resolved   Pressure Injury Documentation: Pressure Injury 01/07/22 Perineum Right (Active)  01/07/22 2130  Location: Perineum  Location Orientation: Right  Staging:   Wound Description (Comments):   Present on Admission: Yes    Code Status: Full code DVT Prophylaxis:  enoxaparin (LOVENOX) injection 40 mg  Start: 01/07/22 2200   Level of Care: Level of care: Telemetry Family Communication: Updated patient's husband at the bedside.  Disposition Plan:     Remains inpatient appropriate: Transitioning to bolus feeding today.  Patient is stable for discharge however patient and husband feels overwhelmed with the tube feeds and does not want to go home until comfortable.  We will continue tube feeds teaching today and hopefully DC home in a.m.  Procedures:  PEG tube placement on 2/9  Consultants:   General surgery IR GI  Antimicrobials:    Anti-infectives (From admission, onward)    Start     Dose/Rate Route Frequency Ordered Stop   01/17/22 1245  ceFAZolin (ANCEF) IVPB 2g/100 mL premix        2 g 200 mL/hr over 30 Minutes Intravenous  Once 01/16/22 1153 01/17/22 1303   01/15/22 1548  ceFAZolin (ANCEF) 2-4 GM/100ML-% IVPB  Status:  Discontinued       Note to Pharmacy: Lesia Hausen: cabinet override      01/15/22 1548 01/15/22 1618   01/15/22 1400  ceFAZolin (ANCEF) IVPB 2g/100 mL premix        2 g 200 mL/hr over 30 Minutes Intravenous To Radiology 01/13/22 0933 01/16/22 1400   01/13/22 2200  cephALEXin (KEFLEX) capsule 500 mg        500 mg Oral Every 12 hours 01/13/22 1347 01/14/22 0950   01/09/22 1630  cephALEXin (KEFLEX) capsule 500 mg  Status:  Discontinued        500 mg Oral Every 12 hours 01/09/22 1531 01/13/22 1347        Medications  Scheduled Meds:  cholecalciferol  1,000 Units Oral Daily   enoxaparin (LOVENOX) injection  40 mg Subcutaneous Q24H   feeding supplement  237 mL Oral TID BM   feeding supplement (JEVITY 1.5 CAL/FIBER)  118 mL Per Tube QID   feeding supplement (PROSource TF)  45 mL Per Tube Daily   FLUoxetine  20 mg Oral Daily   free water  180 mL Per Tube QID   ketorolac  15 mg Intravenous Q6H   levothyroxine  125 mcg Oral QAC breakfast   loratadine  10 mg Oral Daily   multivitamin with minerals  1 tablet Oral Daily   pantoprazole  40 mg Oral BID   polyethylene glycol  17 g Oral Daily   polyvinyl alcohol  2 drop Both Eyes Daily   scopolamine  1 patch Transdermal Q72H   thiamine  100 mg Oral Daily   vitamin B-12  250 mcg Oral Daily   Continuous Infusions:  lactated ringers 50 mL/hr at 01/19/22 0425   PRN Meds:.acetaminophen, fentaNYL (SUBLIMAZE) injection, ondansetron (ZOFRAN) IV, ondansetron, oxyCODONE    Subjective:   Tasha Moss was seen and examined today.  Feels somewhat overwhelmed with the tube feeds.  Patient denies dizziness, chest pain, shortness of breath, abdominal  pain, N/V/D/C. No acute events overnight.    Objective:   Vitals:   01/17/22 2118 01/18/22 0456 01/18/22 2042 01/19/22 0900  BP: (!) 101/58 (!) 109/54 114/65 127/62  Pulse: 90 81 91 87  Resp: 18 18 18 20   Temp: 98.1 F (36.7 C) 97.8 F (36.6 C)  98.4 F (36.9 C)  TempSrc: Oral Oral  Oral  SpO2: 96% 98% 92% 91%  Weight:      Height:        Intake/Output Summary (Last 24 hours) at 01/19/2022 1104 Last data filed at 01/19/2022 1000 Gross per 24 hour  Intake 2467.17  ml  Output 150 ml  Net 2317.17 ml   Filed Weights   01/15/22 0500 01/16/22 0416 01/17/22 0500  Weight: 61.2 kg 55.3 kg 55.3 kg     Exam General: Alert and oriented x 3, NAD Cardiovascular: S1 S2 auscultated,  RRR Respiratory: Clear to auscultation bilaterally, no wheezing, rales or rhonchi Gastrointestinal: Soft, nontender, nondistended, + NBS, PEG tube Ext: no pedal edema bilaterally Neuro: no new deficits Psych: Normal affect and demeanor, alert and oriented x3    Data Reviewed:  I have personally reviewed following labs and imaging studies   CBC Lab Results  Component Value Date   WBC 5.8 01/16/2022   RBC 3.94 01/16/2022   HGB 12.4 01/16/2022   HCT 37.3 01/16/2022   MCV 94.7 01/16/2022   MCH 31.5 01/16/2022   PLT 253 01/16/2022   MCHC 33.2 01/16/2022   RDW 14.7 01/16/2022   LYMPHSABS 1.2 01/09/2022   MONOABS 0.7 01/09/2022   EOSABS 0.2 01/09/2022   BASOSABS 0.0 37/08/6437     Last metabolic panel Lab Results  Component Value Date   NA 133 (L) 01/16/2022   K 3.9 01/16/2022   CL 95 (L) 01/16/2022   CO2 31 01/16/2022   BUN 12 01/16/2022   CREATININE 0.53 01/16/2022   GLUCOSE 100 (H) 01/16/2022   GFRNONAA >60 01/16/2022   GFRAA >60 02/19/2016   CALCIUM 8.7 (L) 01/16/2022   PHOS 3.5 01/19/2022   PROT 5.5 (L) 01/16/2022   ALBUMIN 2.8 (L) 01/16/2022   BILITOT 0.4 01/16/2022   ALKPHOS 62 01/16/2022   AST 19 01/16/2022   ALT 17 01/16/2022   ANIONGAP 7 01/16/2022    CBG (last 3)   Recent Labs    01/19/22 0012 01/19/22 0431 01/19/22 0740  GLUCAP 120* 134* 124*      Coagulation Profile: No results for input(s): INR, PROTIME in the last 168 hours.   Radiology Studies: No results found.     Estill Cotta M.D. [facility] 01/19/2022, 11:04 AM  Available via Epic secure chat 7am-7pm After 7 pm, please refer to night coverage provider listed on amion.

## 2022-01-19 NOTE — Progress Notes (Signed)
PHYSICAL THERAPY  Pt having several bouts of loose stools today.  Will attempt to see another day as pt plans to D/C back home with spouse.  Rica Koyanagi  PTA Acute  Rehabilitation Services Pager      [phone] Office      [phone]

## 2022-01-19 NOTE — Plan of Care (Signed)
Pt tolerating tube feeds well. Dressing changed on new PEG site, some dried drainage. Pt education provided for new PEG tube about administration of medications and feeds. Pt able to transfer to [facility] x1 assist. Pt pain well managed with PRN medications. Pt. VSS. Denies any further needs at this time.

## 2022-01-19 NOTE — Progress Notes (Addendum)
NUTRITION NOTE RD working remotely.   RD notes entered yesterday at 1215 and 1259.  Patient is currently receiving Osmolite 1.5 @ 20 ml/hr with 45 ml Prosource TF BID and 100 ml free water every 4 hours. This regimen is providing 800 kcal, 52 grams protein, and 966 ml free water.   Review of night shift RN note and Surgeon's note today indicates patient is tolerating trickle TF. Able to communicate with day shift RN via secure chat who confirms patient tolerating.  As discussed with patient and husband at bedside yesterday and outlined in note yesterday at 1259, will transition to bolus regimen today in preparation for d/c.  Osmolite 1.5 (fiber-free formula) is on backorder and is unable to be provided by home health agency. Able to talk with rep yesterday on the phone and will provide Jevity 1.5 instead (fiber-containing formula).  Will place order for 1/2 carton (118 ml) Jevity 1.5 QID with 90 ml free water before and 90 ml free water after each bolus and 45 ml Prosource TF (or equivalent) BID.   This regimen should be provided x4 days. At home, the portion of each carton not used can be refrigerated for up to 24 hours and should then be discarded. In the hospital, unused portion should be immediately discarded.   As long as patient is tolerating, can then increase to goal regimen: 1 carton (237 ml) Jevity 1.5 QID with 90 ml free water before and 90 ml free water after each bolus and 45 ml Prosource TF (or equivalent) BID.   Goal regimen will provide 1500 kcal (86% kcal need), 82 grams protein (96% protein need), 20 grams fiber, and 1440 ml free water.   Patient and family can monitor tolerance (cramping, bloating, abdominal pain, nausea) when moving from 118 ml/bolus to 237 ml/bolus. If symptoms occur, can do a middle step of 178 ml Jevity 1.5 QID x4 days in before returning to goal of 237 ml Jevity 1.5 QID.    Estimated Nutritional Needs:  Kcal:  1750-1950 kcal Protein:  85-95  grams Fluid:  >/= 1.9 L/day     Jarome Matin, MS, RD, LDN Inpatient Clinical Dietitian RD pager # available in [place]  After hours/weekend pager # available in [facility]

## 2022-01-19 NOTE — Progress Notes (Signed)
2 Days Post-Op   Subjective/Chief Complaint: No issues, tol tube feeds   Objective: Vital signs in last 24 hours: Pulse Rate:  [91] 91 (02/10 2042) Resp:  [18] 18 (02/10 2042) BP: (114)/(65) 114/65 (02/10 2042) SpO2:  [92 %] 92 % (02/10 2042) Last BM Date: 01/05/22  Intake/Output from previous day: 02/10 0701 - 02/11 0700 In: 2230.2 [P.O.:100; I.V.:1798.8; NG/GT:331.3] Out: -  Intake/Output this shift: Total I/O In: -  Out: 100 [Urine:100]  Incision clean without infection, tube in place  Lab Results:  No results for input(s): WBC, HGB, HCT, PLT in the last 72 hours. BMET No results for input(s): NA, K, CL, CO2, GLUCOSE, BUN, CREATININE, CALCIUM in the last 72 hours. PT/INR No results for input(s): LABPROT, INR in the last 72 hours. ABG No results for input(s): PHART, HCO3 in the last 72 hours.  Invalid input(s): PCO2, PO2  Studies/Results: No results found.  Anti-infectives: Anti-infectives (From admission, onward)    Start     Dose/Rate Route Frequency Ordered Stop   01/17/22 1245  ceFAZolin (ANCEF) IVPB 2g/100 mL premix        2 g 200 mL/hr over 30 Minutes Intravenous  Once 01/16/22 1153 01/17/22 1303   01/15/22 1548  ceFAZolin (ANCEF) 2-4 GM/100ML-% IVPB  Status:  Discontinued       Note to Pharmacy: Lesia Hausen: cabinet override      01/15/22 1548 01/15/22 1618   01/15/22 1400  ceFAZolin (ANCEF) IVPB 2g/100 mL premix        2 g 200 mL/hr over 30 Minutes Intravenous To Radiology 01/13/22 0933 01/16/22 1400   01/13/22 2200  cephALEXin (KEFLEX) capsule 500 mg        500 mg Oral Every 12 hours 01/13/22 1347 01/14/22 0950   01/09/22 1630  cephALEXin (KEFLEX) capsule 500 mg  Status:  Discontinued        500 mg Oral Every 12 hours 01/09/22 1531 01/13/22 1347       Assessment/Plan: POD 2 s/p Placement of the [address] gastrostomy by Dr. Brantley Stage for severe protein calorie malnutrition and dysphagia - Abd binder - Flush tube with 30 ml (20 ml if fluid  restricted) sterile water every 4 hours, before and after medication administration, and when continuous feeding is interrupted. - Follow up arranged  -will sign off,  call back if needed FEN - tube feeds VTE - SCDs, Lovenox ID - Ancef peri-op   Esophageal stricture Severe protein calorie malnutrition  Generalized weakness/FTT HTN HLD GERD BPPV Hypothyroidism s/p partial thyroidectomy  Chronic diastolic CHF Sacral ulcer    Rolm Bookbinder 01/19/2022

## 2022-01-20 LAB — PHOSPHORUS
Phosphorus: 3.8 mg/dL (ref 2.5–4.6)
Phosphorus: 3.6 mg/dL (ref 2.5–4.6)

## 2022-01-20 LAB — MAGNESIUM
Magnesium: 1.4 mg/dL — ABNORMAL LOW (ref 1.7–2.4)
Magnesium: 1.4 mg/dL — ABNORMAL LOW (ref 1.7–2.4)

## 2022-01-20 LAB — BASIC METABOLIC PANEL
Anion gap: 5 (ref 5–15)
BUN: 17 mg/dL (ref 8–23)
CO2: 32 mmol/L (ref 22–32)
Calcium: 8 mg/dL — ABNORMAL LOW (ref 8.9–10.3)
Chloride: 96 mmol/L — ABNORMAL LOW (ref 98–111)
Creatinine, Ser: 0.36 mg/dL — ABNORMAL LOW (ref 0.44–1.00)
GFR, Estimated: >60 mL/min (ref >60)
Glucose, Bld: 120 mg/dL — ABNORMAL HIGH (ref 70–99)
Potassium: 4.5 mmol/L (ref 3.5–5.1)
Sodium: 133 mmol/L — ABNORMAL LOW (ref 135–145)

## 2022-01-20 LAB — GLUCOSE, CAPILLARY
Glucose-Capillary: 100 mg/dL — ABNORMAL HIGH (ref 70–99)
Glucose-Capillary: 110 mg/dL — ABNORMAL HIGH (ref 70–99)
Glucose-Capillary: 127 mg/dL — ABNORMAL HIGH (ref 70–99)
Glucose-Capillary: 128 mg/dL — ABNORMAL HIGH (ref 70–99)
Glucose-Capillary: 95 mg/dL (ref 70–99)
Glucose-Capillary: 97 mg/dL (ref 70–99)

## 2022-01-20 MED ORDER — METOCLOPRAMIDE HCL 5 MG/ML IJ SOLN
5.0000 mg | Freq: Four times a day (QID) | INTRAMUSCULAR | Status: AC
  Administered 2022-01-20 – 2022-01-21 (×4): 5 mg via INTRAVENOUS
  Filled 2022-01-20 (×4): qty 2

## 2022-01-20 MED ORDER — FREE WATER
100.0000 mL | Status: DC
  Administered 2022-01-20 – 2022-01-22 (×13): 100 mL

## 2022-01-20 MED ORDER — JEVITY 1.2 CAL PO LIQD
1000.0000 mL | ORAL | Status: DC
  Administered 2022-01-20 – 2022-01-22 (×2): 1000 mL
  Filled 2022-01-20 (×4): qty 1000

## 2022-01-20 MED ORDER — OSMOLITE 1.5 CAL PO LIQD
1000.0000 mL | ORAL | Status: DC
  Filled 2022-01-20: qty 1000

## 2022-01-20 NOTE — Progress Notes (Signed)
[facility]                                                                               Tasha Moss, is a 86 y.o. female, DOB - Jul 31, 1936, QIO:[id number] Admit date - 01/07/2022    Outpatient Primary MD for the patient is [name], MD  LOS - 12  days    Brief summary   86 yo F with hx BPPV, hypertension, HLD, hypothyroidism s/p partial thyroidectomy, GERD, esophageal stricture who presented with generalized weakness.  Sounds like was admitted with closed left hip dislocation s/p reduction in November and was discharged to SNF.  She caught COVID in rehab and has subsequently developed progressive vertigo with nausea and vomiting.  She's also had progressive dysphagia and issues with passage of solid food.  She had barium swallow on 1/19 where she aspirated into the trachea and plan was for MBS at [facility] this week.  She continued to have dizziness with vertigo and N/V.  She's developed progressive weakness.  She also had worsening edema and imaging concerning for HF.  She's been admitted with failure to thrive.    PEG tube placed on 2/9, tube feeds started on 2/10   Assessment & Plan    Assessment and Plan: * Failure to thrive in adult- (present on admission) - Multifactorial, related to vertigo, dysphagia, poor nutritional status   Malnutrition of moderate degree - Multifactorial, likely due to oropharyngeal dysphagia, poor nutritional status, chronic illness -Continue tube feeds with free water  Sacral decubitus ulcer, stage II (HCC) - Continue wound care consult recommendations  Acute CHF (congestive heart failure) (HCC) -CT with CHF, cardiomegaly, pulm vascular prominence, trace effusions, interstitial edema, possibly early alveolar edema.  Received Lasix x1. -Appears to be compensated and euvolemic -2D echo 2/1 showed EF of 65%, G1 DD  Dysphagia - Followed outpatient by GI, underwent upper GI series as outpatient on 1/19 with aspiration of  contrast into trachea at the level of mainstem bronchi.  -SLP consulted.  MBS showed pharyngeal esophageal and pharyngeal phase dysphagia-chronic due to position of cervical spine with exacerbation related to her current illness/deconditioning -Recommended full liquid, nectar thick diet. -EGD on 2/3, esophageal dilation.  Recommended PEG. -Palliative medicine consulted and patient, family wishes to proceed with PEG -Underwent PEG placement on 2/9 -Patient has not been able to tolerate bolus feeds, has diarrhea, nausea and vomiting.  She was able to tolerate continuous tube feeding.  Had a long discussion with patient, husband at bedside and daughter on the phone, they feel comfortable with continuous feeds at home at night and possibly allow small portions of liquid diet during the day. -Will DC bolus feeds and resume continuous tube feeds, discussed in detail with RN and dietitian, Janett Billow.  Will monitor closely over the next 24 hours if patient is able to tolerate the regimen.   Benign paroxysmal positional vertigo- (present on admission) - Continue scopolamine patch, avoid Dramamine due to anticholinergic side effects.   -CT head on 1/30 head showed no acute abnormality.   -Continue PT OT vestibular rehab  Hypothyroidism- (present on admission) - TSH mildly elevated -Follow-up outpatient after acute illness is resolved  Pressure Injury Documentation: Pressure Injury 01/07/22 Perineum Right (Active)  01/07/22 2130  Location: Perineum  Location Orientation: Right  Staging:   Wound Description (Comments):   Present on Admission: Yes   Code Status: Full CODE STATUS DVT Prophylaxis:  enoxaparin (LOVENOX) injection 40 mg Start: 01/07/22 2200   Level of Care: Level of care: Telemetry Family Communication: Updated patient's husband at the bedside, daughter on the phone  Disposition Plan:     Remains inpatient appropriate: Currently not tolerating bolus feedings  Procedures:  PEG  tube placement  Consultants:   General surgery IR GI  Antimicrobials:  None  Scheduled Meds:  cholecalciferol  1,000 Units Oral Daily   enoxaparin (LOVENOX) injection  40 mg Subcutaneous Q24H   feeding supplement  237 mL Oral TID BM   feeding supplement (PROSource TF)  45 mL Per Tube Daily   FLUoxetine  20 mg Oral Daily   free water  100 mL Per Tube Q4H   ketorolac  15 mg Intravenous Q6H   levothyroxine  125 mcg Oral QAC breakfast   loratadine  10 mg Oral Daily   metoCLOPramide (REGLAN) injection  5 mg Intravenous Q6H   multivitamin with minerals  1 tablet Oral Daily   pantoprazole  40 mg Oral BID   polyvinyl alcohol  2 drop Both Eyes Daily   scopolamine  1 patch Transdermal Q72H   thiamine  100 mg Oral Daily   vitamin B-12  250 mcg Oral Daily   Continuous Infusions:  feeding supplement (JEVITY 1.2 CAL)     PRN Meds:.acetaminophen, fentaNYL (SUBLIMAZE) injection, loperamide, ondansetron (ZOFRAN) IV, ondansetron, oxyCODONE    Subjective:   Tasha Moss was seen and examined today.  Yesterday had several episodes of liquidy diarrhea, dry heaving since morning.  Feels miserable today.   Objective:   Vitals:   01/18/22 2042 01/19/22 0900 01/19/22 2038 01/20/22 0501  BP: 114/65 127/62 132/65 (!) 160/72  Pulse: 91 87 92 91  Resp: 18 20 20 20   Temp:  98.4 F (36.9 C) 98.1 F (36.7 C) (!) 97.5 F (36.4 C)  TempSrc:  Oral Oral Oral  SpO2: 92% 91% 94% 94%  Weight:      Height:       No intake or output data in the 24 hours ending 01/20/22 1237 Filed Weights   01/15/22 0500 01/16/22 0416 01/17/22 0500  Weight: 61.2 kg 55.3 kg 55.3 kg     Exam General: Alert and oriented x 3, NAD, frail and ill-appearing Cardiovascular: S1 S2 auscultated, RRR Respiratory: Clear to auscultation bilaterally Gastrointestinal: Soft, nontender, nondistended, + bowel sounds, PEG tube Ext: no pedal edema bilaterally Neuro: no new deficits   Data Reviewed:  I have personally  reviewed following labs and imaging studies   CBC Lab Results  Component Value Date   WBC 5.8 01/16/2022   RBC 3.94 01/16/2022   HGB 12.4 01/16/2022   HCT 37.3 01/16/2022   MCV 94.7 01/16/2022   MCH 31.5 01/16/2022   PLT 253 01/16/2022   MCHC 33.2 01/16/2022   RDW 14.7 01/16/2022   LYMPHSABS 1.2 01/09/2022   MONOABS 0.7 01/09/2022   EOSABS 0.2 01/09/2022   BASOSABS 0.0 23/76/2831     Last metabolic panel Lab Results  Component Value Date   NA 133 (L) 01/20/2022   K 4.5 01/20/2022   CL 96 (L) 01/20/2022   CO2 32 01/20/2022   BUN 17 01/20/2022   CREATININE 0.36 (L) 01/20/2022   GLUCOSE 120 (H) 01/20/2022  GFRNONAA >60 01/20/2022   GFRAA >60 02/19/2016   CALCIUM 8.0 (L) 01/20/2022   PHOS 3.8 01/20/2022   PROT 5.5 (L) 01/16/2022   ALBUMIN 2.8 (L) 01/16/2022   BILITOT 0.4 01/16/2022   ALKPHOS 62 01/16/2022   AST 19 01/16/2022   ALT 17 01/16/2022   ANIONGAP 5 01/20/2022    CBG (last 3)  Recent Labs    01/20/22 0424 01/20/22 0745 01/20/22 1124  GLUCAP 110* 95 100*      Coagulation Profile: No results for input(s): INR, PROTIME in the last 168 hours.   Radiology Studies: No results found.     Estill Cotta M.D. [facility] 01/20/2022, 12:37 PM  Available via Epic secure chat 7am-7pm After 7 pm, please refer to night coverage provider listed on amion.

## 2022-01-20 NOTE — Progress Notes (Signed)
NUTRITION NOTE  Secure chat message received from RN and MD that patient is having severe nausea today. Request to transition back to continuous TF rather than bolus. MD shares that family is aware of and agreeable with plan.  Will order Jevity 1.2 @ 20 ml/hr to advance by 10 ml every 8 hours to reach goal rate of 60 ml/hr with 45 ml Prosource TF BID and 100 ml free water every 4 hours.   At goal rate, this regimen will provide 1808 kcal, 102 grams protein, and 1762 ml free water.  Once patient is able to tolerate TF at 50 ml/hr, will transition to nocturnal TF regimen. Assessing tolerance at close to goal continuous rate is preferable for patient who meets criteria for malnutrition and who has been unable to tolerate bolus TF regimen.      Estimated Nutritional Needs:  Kcal:  1750-1950 kcal Protein:  85-95 grams Fluid:  >/= 1.9 L/day    Jarome Matin, MS, RD, LDN Inpatient Clinical Dietitian RD pager # available in [place]  After hours/weekend pager # available in [facility]

## 2022-01-20 NOTE — Progress Notes (Signed)
Pt dry heaving at this time. PRN zofran given.

## 2022-01-21 LAB — POTASSIUM
Potassium: 4 mmol/L (ref 3.5–5.1)
Potassium: 4 mmol/L (ref 3.5–5.1)

## 2022-01-21 LAB — PHOSPHORUS
Phosphorus: 4.4 mg/dL (ref 2.5–4.6)
Phosphorus: 3.4 mg/dL (ref 2.5–4.6)

## 2022-01-21 LAB — GLUCOSE, CAPILLARY
Glucose-Capillary: 103 mg/dL — ABNORMAL HIGH (ref 70–99)
Glucose-Capillary: 119 mg/dL — ABNORMAL HIGH (ref 70–99)
Glucose-Capillary: 120 mg/dL — ABNORMAL HIGH (ref 70–99)
Glucose-Capillary: 126 mg/dL — ABNORMAL HIGH (ref 70–99)
Glucose-Capillary: 136 mg/dL — ABNORMAL HIGH (ref 70–99)
Glucose-Capillary: 98 mg/dL (ref 70–99)

## 2022-01-21 LAB — MAGNESIUM
Magnesium: 1.6 mg/dL — ABNORMAL LOW (ref 1.7–2.4)
Magnesium: 1.7 mg/dL (ref 1.7–2.4)

## 2022-01-21 MED ORDER — CEPHALEXIN 500 MG PO CAPS
500.0000 mg | ORAL_CAPSULE | Freq: Four times a day (QID) | ORAL | Status: DC
  Administered 2022-01-21 – 2022-01-23 (×8): 500 mg via ORAL
  Filled 2022-01-21 (×8): qty 1

## 2022-01-21 MED ORDER — GERHARDT'S BUTT CREAM
TOPICAL_CREAM | Freq: Four times a day (QID) | CUTANEOUS | Status: DC
  Filled 2022-01-21 (×3): qty 1

## 2022-01-21 MED ORDER — JUVEN PO PACK
1.0000 | PACK | Freq: Two times a day (BID) | ORAL | Status: DC
  Administered 2022-01-21 – 2022-01-23 (×4): 1
  Filled 2022-01-21 (×5): qty 1

## 2022-01-21 NOTE — Progress Notes (Signed)
[facility]                                                                               Tasha Moss, is a 86 y.o. female, DOB - 10-23-1936, SWN:[id number] Admit date - 01/07/2022    Outpatient Primary MD for the patient is Tasha Minium, MD  LOS - 13  days    Brief summary   86 yo F with hx BPPV, hypertension, HLD, hypothyroidism s/p partial thyroidectomy, GERD, esophageal stricture who presented with generalized weakness.  Sounds like was admitted with closed left hip dislocation s/p reduction in November and was discharged to SNF.  She caught COVID in rehab and has subsequently developed progressive vertigo with nausea and vomiting.  She's also had progressive dysphagia and issues with passage of solid food.  She had barium swallow on 1/19 where she aspirated into the trachea and plan was for MBS at [facility] this week.  She continued to have dizziness with vertigo and N/V.  She's developed progressive weakness.  She also had worsening edema and imaging concerning for HF.  She's been admitted with failure to thrive.    PEG tube placed on 2/9, tube feeds started on 2/10   Assessment & Plan    Assessment and Plan: * Failure to thrive in adult- (present on admission) - Multifactorial, related to vertigo, dysphagia, poor nutritional status   Malnutrition of moderate degree - Multifactorial, likely due to oropharyngeal dysphagia, poor nutritional status, chronic illness -Continue tube feeds with free water  Sacral decubitus ulcer, stage II ([place]) - Appreciate wound for evaluation and recommendations.  Acute CHF (congestive heart failure) (HCC) -CT with CHF, cardiomegaly, pulm vascular prominence, trace effusions, interstitial edema, possibly early alveolar edema.  Received Lasix x1. -Appears to be compensated and euvolemic -2D echo 2/1 showed EF of 65%, G1 DD  Dysphagia - Followed outpatient by GI, underwent upper GI series as outpatient on 1/19 with  aspiration of contrast into trachea at the level of mainstem bronchi.  -SLP consulted.  MBS showed pharyngeal esophageal and pharyngeal phase dysphagia-chronic due to position of cervical spine with exacerbation related to her current illness/deconditioning -Recommended full liquid, nectar thick diet. -EGD on 2/3, esophageal dilation.  Recommended PEG. -Palliative medicine consulted and patient, family wishes to proceed with PEG -Underwent PEG placement on 2/9, was not able to tolerate bolus feeds. -Placed back on continuous feeds on 2/12 and has been tolerating it.  No further nausea vomiting or diarrhea. -Plan is to have continuous feed at night at home with full liquid, nectar thick diet during the day if patient is able to tolerate   Benign paroxysmal positional vertigo- (present on admission) - Continue scopolamine patch, avoid Dramamine due to anticholinergic side effects.   -CT head on 1/30 head showed no acute abnormality.   -Continue PT OT vestibular rehab  Hypothyroidism- (present on admission) - TSH mildly elevated -Follow-up outpatient after acute illness is resolved   Pressure Injury Documentation: Pressure Injury 01/07/22 Perineum Right (Active)  01/07/22 2130  Location: Perineum  Location Orientation: Right  Staging:   Wound Description (Comments):   Present on Admission: Yes   Code Status: Full CODE STATUS  DVT Prophylaxis:  enoxaparin (LOVENOX) injection 40 mg Start: 01/07/22 2200   Level of Care: Level of care: Telemetry Family Communication: Updated patient's husband and daughter yesterday  Disposition Plan:     Remains inpatient appropriate: If patient is able to tolerate small portions of liquid diet PO and continuous feedings at night, will DC home tomorrow.  Will need feeding pump, TOC aware   Procedures:  PEG tube placement  Consultants:   General surgery IR GI  Antimicrobials:  None   Medications   cholecalciferol  1,000 Units Oral Daily    enoxaparin (LOVENOX) injection  40 mg Subcutaneous Q24H   feeding supplement  237 mL Oral TID BM   feeding supplement (PROSource TF)  45 mL Per Tube Daily   FLUoxetine  20 mg Oral Daily   free water  100 mL Per Tube Q4H   Gerhardt's butt cream   Topical QID   ketorolac  15 mg Intravenous Q6H   levothyroxine  125 mcg Oral QAC breakfast   loratadine  10 mg Oral Daily   multivitamin with minerals  1 tablet Oral Daily   nutrition supplement (JUVEN)  1 packet Per Tube BID BM   pantoprazole  40 mg Oral BID   polyvinyl alcohol  2 drop Both Eyes Daily   scopolamine  1 patch Transdermal Q72H   thiamine  100 mg Oral Daily   vitamin B-12  250 mcg Oral Daily     Subjective:   Tasha Moss was seen and examined today.  Alert and oriented, feels a lot better today.  No nausea vomiting or diarrhea today.  Tolerating continuous feeds.    Objective:   Vitals:   01/20/22 2013 01/21/22 0331 01/21/22 0500 01/21/22 1227  BP: 136/65 (!) 142/58  132/76  Pulse: 93 89  97  Resp: 18 20  18   Temp: 98.4 F (36.9 C) 98.3 F (36.8 C)  98.5 F (36.9 C)  TempSrc: Oral Oral  Oral  SpO2: 95% 100%  97%  Weight:   63 kg   Height:        Intake/Output Summary (Last 24 hours) at 01/21/2022 1247 Last data filed at 01/21/2022 0408 Gross per 24 hour  Intake 271.67 ml  Output 1 ml  Net 270.67 ml   Filed Weights   01/16/22 0416 01/17/22 0500 01/21/22 0500  Weight: 55.3 kg 55.3 kg 63 kg     Exam General: Alert and oriented x 3, NAD Cardiovascular: S1 S2 auscultated, no murmurs, RRR Respiratory: CTAB no wheezing, Gastrointestinal: Soft, NT, ND, + bowel sounds, PEG tube Ext: no pedal edema bilaterally Neuro: no new deficits   Data Reviewed:  I have personally reviewed following labs   Last metabolic panel Lab Results  Component Value Date   NA 133 (L) 01/20/2022   K 4.5 01/20/2022   CL 96 (L) 01/20/2022   CO2 32 01/20/2022   BUN 17 01/20/2022   CREATININE 0.36 (L) 01/20/2022   GLUCOSE 120  (H) 01/20/2022   GFRNONAA >60 01/20/2022   GFRAA >60 02/19/2016   CALCIUM 8.0 (L) 01/20/2022   PHOS 4.4 01/21/2022   PROT 5.5 (L) 01/16/2022   ALBUMIN 2.8 (L) 01/16/2022   BILITOT 0.4 01/16/2022   ALKPHOS 62 01/16/2022   AST 19 01/16/2022   ALT 17 01/16/2022   ANIONGAP 5 01/20/2022    CBG (last 3)  Recent Labs    01/21/22 0336 01/21/22 0748 01/21/22 1212  GLUCAP 98 119* 103*     Kaimen Peine M.D.  [facility] 01/21/2022, 12:47 PM  Available via Epic secure chat 7am-7pm After 7 pm, please refer to night coverage provider listed on amion.

## 2022-01-21 NOTE — Progress Notes (Signed)
Speech Language Pathology Treatment: Dysphagia  Patient Details Name: Tasha Moss MRN: [id number] DOB: 1936-04-23 Today's Date: 01/21/2022 Time: 2409-7353 SLP Time Calculation (min) (ACUTE ONLY): 8 min  Assessment / Plan / Recommendation Clinical Impression  Patient briefly seen by SLP with spouse present in room, for therapy session focused on dysphagia goals. Patient was sitting in chair, tearful and reported she was "upset" but did not elaborate and SLP did not ask any further questions. Diet order of full liquids (nectar thick) has been reinstated and so SLP able to observe her with PO intake. Patient continues to do an excellent job of keeping up with recommendations, telling SLP "it has to be thickened". She took a few straw sips of nectar thick apple juice and did not exhibit any overt s/s aspiration or penetration. SLP spoke with RN regarding patient after session. Patient has unfortunately not been tolerating bolus feeding and so she is not yet able to discharge home and patient has been understandably upset by this. SLP to continue to follow patient for toleration and determination if any benefit from RMST or swallow exercises.    HPI HPI: per admitting MD note "Patient was hospitalized back in November with a dislocated left total hip arthroplasty status post reduction and was discharged to SNF.  Also contracted COVID in rehab.  She subsequently developed worsening vertigo with nausea and vomiting thought to be due to bilateral vestibular disorder.  She also has progressive worsening dysphagia and issues with passage of solid food. She was referred to GI and had barium swallow on 1/19  but aspirated contrast. Now has modified barium swallow planned at [facility] later this week."  Esophagram was unremarkable except for aspiraiton.  MBS ordered in house for pt due to pt's ongoing dysphagia issues. Per interview with pt and pt's daughter, Tasha Moss, pt has been having dysphagia for approx six  months.  Dysphagia coupled with gustatory deficits have caused pt to lose a significant amount of weight - most notably since November.  Pt states food stick in her throat causing her to drink liquids to clear or cough and expectorate.  She has undergone prior esophageal dilatation and daughter reports pt has problems with pills. Pt also with h/o cervical spine surgery and thyroid surgery.      SLP Plan  Continue with current plan of care      Recommendations for follow up therapy are one component of a multi-disciplinary discharge planning process, led by the attending physician.  Recommendations may be updated based on patient status, additional functional criteria and insurance authorization.    Recommendations  Diet recommendations: Nectar-thick liquid;Other(comment) Liquids provided via: Straw;Cup Medication Administration: Other (Comment) Supervision: Patient able to self feed Compensations: Small sips/bites;Slow rate Postural Changes and/or Swallow Maneuvers: Seated upright 90 degrees;Upright 30-60 min after meal                Oral Care Recommendations: Oral care BID Follow Up Recommendations: Other (comment) (TBD, may benefit from [facility] SLP) Assistance recommended at discharge: Intermittent Supervision/Assistance SLP Visit Diagnosis: Dysphagia, pharyngoesophageal phase (R13.14);Dysphagia, pharyngeal phase (R13.13) Plan: Continue with current plan of care         Sonia Baller, MA, CCC-SLP Speech Therapy

## 2022-01-21 NOTE — Consult Note (Signed)
WOC Nurse Consult Note: Patient receiving care in [place]. Spouse present and providing overview of events since November of 2022. Reason for Consult: possible cellulitis on entire buttocks. Wound type: Bilateral buttocks, coccyx, sacrum are reddened, have some peeling skin, blanchable, slightly pruritic per patient, there are no satellite lesions. Spouse tells me it started last year when her hip became dislocated. Pressure Injury POA: Yes/No/NA Measurement: Wound bed: erythematous, no edema, some peeling skin, all areas are blanchable. There are 2 very small partial thickness areas to the left upper gluteal cleft that are pink.  These 2 small areas are the only areas with non-intact tissue. They are very shallow and consistent with moisture associated skin damage. Drainage (amount, consistency, odor) none Periwound: intact Dressing procedure/placement/frequency: I have ordered Gerhardt's butt cream QID. When this starts, the sacral foam dressing is to be discontinued. The patient has not been turning herself in bed, although she can. I have added a care instruction to turn her every 2 hours.  She is currently on a low air loss mattress.  I have explained the rationale to her and her spouse.  Monitor the wound area(s) for worsening of condition such as: Signs/symptoms of infection,  Increase in size,  Development of or worsening of odor, Development of pain, or increased pain at the affected locations.  Notify the medical team if any of these develop.  Thank you for the consult.  Discussed plan of care with the patient and her spouse.  [place] nurse will not follow at this time.  Please re-consult the [place] team if needed.  Val Riles, RN, MSN, CWOCN, CNS-BC, pager [phone]

## 2022-01-21 NOTE — Progress Notes (Addendum)
Nutrition Follow-up  DOCUMENTATION CODES:   Non-severe (moderate) malnutrition in context of chronic illness  INTERVENTION:  - will check serum vitamin A, vitamin C, vitamin D, thiamine, zinc, and thiamine. - check K, Mg, and Phos tonight at 1700 and tomorrow at 0500 and 1700. - will communicate with inpatient MD and/or PCP related to any lab results that are low.  - continue to advance to goal regimen of Jevity 1.2 @ 60 ml/hr with 45 ml Prosource TF BID, and 100 ml free water every 4 hours.  - will add 1 packet Juven BID, each packet provides 95 calories, 2.5 grams of protein (collagen), and 9.8 grams of carbohydrate (3 grams sugar); also contains 7 grams of L-arginine and L-glutamine, 300 mg vitamin C, 15 mg vitamin E, 1.2 mcg vitamin B-12, 9.5 mg zinc, 200 mg calcium, and 1.5 g  Calcium Beta-hydroxy-Beta-methylbutyrate to support wound healing.  - this regimen will provide 1958 kcal, 96 grams protein, 24 grams fiber, and 2242 ml free water.    NUTRITION DIAGNOSIS:   Moderate Malnutrition related to chronic illness, dysphagia as evidenced by moderate fat depletion, moderate muscle depletion. -ongoing  GOAL:   Patient will meet greater than or equal to 90% of their needs -to be met with TF regimen at goal  MONITOR:   TF tolerance, Labs, Weight trends, Skin   ASSESSMENT:   86 y.o. female with medical history of BPPV, HTN, HLD, hypothyroidism s/p partial thyroidectomy, GERD, and esophageal stricture. She presented to the ED due to weakness with inability to get up unassisted and progressive dysphagia. She was hospitalized in 10/2021 and underwent L total hip arthroplasty and was discharged to SNF/rehab where she contracted COVID at the beginning of 11/2021. She developed worsening vertigo and N/V with concern for cause being bilateral vestibular disorder. She had barium swallow evaluation on 12/27/21 during which she aspirated contrast. She continues to have dizziness 2/2 vertigo, N/V,  and decreased intake of food and liquids.  Patient laying in bed with husband and daughter at bedside. RN also at bedside.   Patient with G-tube and is currently receiving Jevity 1.2 @ 40 ml/hr with 45 ml Prosource TF once/day and 100 ml free water every 4 hours. This regimen is providing 1192 kcal, 64 grams protein, 16 grams fiber, and 1375 ml free water.  RN shares that rate had been increased from 30 ml/hr to 40 ml/hr about 10-15 minutes prior to RD visit.   Patient reports that she is feeling better today, nausea is mainly resolved, and she is feeling satisfied (as one does after eating). She has no questions at this time.   Able to spend most of the visit talking with patient's daughter. She shares about patient's limited intake for several months PTA, mainly taking only a few small bites/day and then only having liquids over the past 2 weeks. She expresses the concern she had related to bolus feedings and is appreciative of transition back to continuous feeds.  Discussed plan for tube feeding rate advancement and to then be able to transition to feedings being provided over 16-18 hours/day to allow patient to have time disconnected from the pump.   Daughter shared a picture of buttocks area and shares with RD about the progression of redness, peeling, and sloughing to this area. RD shares ability to order Juven via G-tube to provide additional nutrients for wound healing and family is appreciative of this. Also discussed checking serum levels of multiple nutrients and daughter is very receptive; RD communicated  with MD and orders subsequently placed.   Daughter requests that teaching related to feeding pump be done with patient's husband and with either herself or her sister so that they can best support their parents at home. RN at bedside during this time and confirms that nursing staff to assist with this request.   Weight today is +17 lb compared to 2/9. No information documented in the  edema section of flow sheet this hospitalization. She is noted to be +3.5 L since admission.    Labs reviewed; CBGs: 136, 98, 119 mg/dl, Na: 133 mmol/l, Cl: 96 mmol/l, creatinine: 0.36 mg/dl, Ca: 8 mg/dl, Mg: 1.6 mg/dl, K and Phos WDL.   Medications reviewed; 1000 units cholecalciferol/day, 125 mcg oral synthroid/day, PRN imodium, 5 mg IV reglan x4 doses 2/12-2/13 AM, 1 tablet multivitamin with minerals/day, 40 mg oral protonix BID, 100 mg oral thiamine/day, 250 mcg oral cyanocobalamin/day.    Diet Order:   Diet Order     None       EDUCATION NEEDS:   Education needs have been addressed  Skin:  Skin Assessment: Skin Integrity Issues: Skin Integrity Issues:: Stage II Stage II: sacrum (per MD note on 1/31)  Last BM:  2/12 (type 6, smear; no other documentation from 2/12)  Height:   Ht Readings from Last 1 Encounters:  01/07/22 _0  (1.651 m)    Weight:   Wt Readings from Last 1 Encounters:  01/21/22 63 kg     BMI:  Body mass index is 23.11 kg/m.   Estimated Nutritional Needs:  Kcal:  1750-1950 kcal Protein:  85-95 grams Fluid:  >/= 1.9 L/day     Jarome Matin, MS, RD, LDN Inpatient Clinical Dietitian RD pager # available in [place]  After hours/weekend pager # available in [facility]

## 2022-01-21 NOTE — TOC Progression Note (Signed)
Transition of Care [facility]) - Progression Note    Patient Details  Name: Tasha Moss MRN: [id number] Date of Birth: October 21, 1936  [facility] of [facility]) CM/SW Contact  Leeroy Cha, RN Phone Number: 01/21/2022, 10:49 AM  Clinical Narrative:    Tasha Moss is arranging the home needs for the tube feeding.   Expected Discharge Plan: [place] Barriers to Discharge: Continued Medical Work up  Expected Discharge Plan and Services Expected Discharge Plan: [place]   Discharge Planning Services: CM Consult   Living arrangements for the past 2 months: Single Family Home                 DME Arranged: Tube feeding pump DME Agency: [employer] Date DME Agency Contacted: 01/18/22 Time DME Agency Contacted: 28   HH Arranged: RN [place] Agency: [place] Date HH Agency Contacted: 01/21/22 Time HH Agency Contacted: 0800 Representative spoke with at [place]: [place] Determinants of Health ([place]) Interventions    Readmission Risk Interventions No flowsheet data found.

## 2022-01-22 ENCOUNTER — Telehealth: Payer: Self-pay

## 2022-01-22 LAB — PHOSPHORUS
Phosphorus: 3.7 mg/dL (ref 2.5–4.6)
Phosphorus: 3 mg/dL (ref 2.5–4.6)

## 2022-01-22 LAB — BASIC METABOLIC PANEL
Anion gap: 5 (ref 5–15)
BUN: 20 mg/dL (ref 8–23)
CO2: 31 mmol/L (ref 22–32)
Calcium: 8.2 mg/dL — ABNORMAL LOW (ref 8.9–10.3)
Chloride: 98 mmol/L (ref 98–111)
Creatinine, Ser: 0.37 mg/dL — ABNORMAL LOW (ref 0.44–1.00)
GFR, Estimated: >60 mL/min (ref >60)
Glucose, Bld: 131 mg/dL — ABNORMAL HIGH (ref 70–99)
Potassium: 4.4 mmol/L (ref 3.5–5.1)
Sodium: 134 mmol/L — ABNORMAL LOW (ref 135–145)

## 2022-01-22 LAB — GLUCOSE, CAPILLARY
Glucose-Capillary: 129 mg/dL — ABNORMAL HIGH (ref 70–99)
Glucose-Capillary: 118 mg/dL — ABNORMAL HIGH (ref 70–99)
Glucose-Capillary: 120 mg/dL — ABNORMAL HIGH (ref 70–99)
Glucose-Capillary: 122 mg/dL — ABNORMAL HIGH (ref 70–99)
Glucose-Capillary: 113 mg/dL — ABNORMAL HIGH (ref 70–99)
Glucose-Capillary: 144 mg/dL — ABNORMAL HIGH (ref 70–99)

## 2022-01-22 LAB — VITAMIN D 25 HYDROXY (VIT D DEFICIENCY, FRACTURES): Vit D, 25-Hydroxy: 49.64 ng/mL (ref 30–100)

## 2022-01-22 LAB — MAGNESIUM
Magnesium: 1.6 mg/dL — ABNORMAL LOW (ref 1.7–2.4)
Magnesium: 1.5 mg/dL — ABNORMAL LOW (ref 1.7–2.4)

## 2022-01-22 MED ORDER — FREE WATER
100.0000 mL | Freq: Every day | Status: DC
  Administered 2022-01-22 – 2022-01-23 (×6): 100 mL

## 2022-01-22 MED ORDER — JEVITY 1.2 CAL PO LIQD
1000.0000 mL | ORAL | Status: DC
  Administered 2022-01-22 (×2): 1000 mL
  Filled 2022-01-22 (×2): qty 1000

## 2022-01-22 MED ORDER — SACCHAROMYCES BOULARDII 250 MG PO CAPS
250.0000 mg | ORAL_CAPSULE | Freq: Two times a day (BID) | ORAL | Status: DC
  Administered 2022-01-22 – 2022-01-23 (×3): 250 mg
  Filled 2022-01-22 (×3): qty 1

## 2022-01-22 NOTE — Progress Notes (Signed)
Physical Therapy Treatment Patient Details Name: Tasha Moss MRN: [id number] DOB: May 16, 1936 Today's Date: 01/22/2022   History of Present Illness Patient is 86 y.o. female presented to [facility] for progressive weakness, dizziness, vertigo, and N/V. patient recently admitted for Lt hip dislocation in November 2022 and discharge to SNF where she caught COVID and ifrst developed vertigo symptoms. PMH signifnicant for HLD, HTN, BPPV, hypothyroidism, thyroidectomy, GERD, anxiety, OA, Lt TSA, bil THA in 1990's.    PT Comments    Patient making steady progress with mobility and tolerated increased ambulation distance of ~80' and additional activity up to bathroom for toileting. Pt continues to require min assist to power up for stand and to steady with gait intermittently. EOS pt returned to bed for rest and to visit with family. No c/o dizziness. Acute PT will continue to progress pt as able.    Recommendations for follow up therapy are one component of a multi-disciplinary discharge planning process, led by the attending physician.  Recommendations may be updated based on patient status, additional functional criteria and insurance authorization.  Follow Up Recommendations  Home health PT     Assistance Recommended at Discharge Frequent or constant Supervision/Assistance  Patient can return home with the following A little help with walking and/or transfers;A little help with bathing/dressing/bathroom;Assistance with cooking/housework;Direct supervision/assist for medications management;Assist for transportation;Help with stairs or ramp for entrance   Equipment Recommendations  None recommended by PT    Recommendations for Other Services       Precautions / Restrictions Precautions Precautions: Fall Precaution Comments: PEG placenment 01/17/2022 Restrictions Weight Bearing Restrictions: No     Mobility  Bed Mobility Overal bed mobility: Needs Assistance Bed Mobility: Sit to Supine        Sit to supine: Min guard   General bed mobility comments: pt OOB in recliner at start. returned to supine with min guard; pt able to bring    Transfers Overall transfer level: Needs assistance Equipment used: Rolling walker (2 wheels) Transfers: Sit to/from Stand Sit to Stand: Min assist           General transfer comment: pt using bil UE for power up. assist to rise from recliner 2x and from toilet.    Ambulation/Gait Ambulation/Gait assistance: Min guard, Min assist Gait Distance (Feet): 80 Feet Assistive device: Rolling walker (2 wheels) Gait Pattern/deviations: Step-through pattern, Decreased stride length, Trunk flexed Gait velocity: decr     General Gait Details: pt tolerated increased distance and demonstrated improved carryover for safe proximity to RW at EOS. min assist/guard throughout to stabilize. cues for posture intermittently.   Stairs             Wheelchair Mobility    Modified Rankin (Stroke Patients Only)       Balance Overall balance assessment: Needs assistance Sitting-balance support: Feet supported, Bilateral upper extremity supported Sitting balance-Leahy Scale: Fair     Standing balance support: Bilateral upper extremity supported, Reliant on assistive device for balance, During functional activity Standing balance-Leahy Scale: Poor                              Cognition Arousal/Alertness: Awake/alert Behavior During Therapy: WFL for tasks assessed/performed Overall Cognitive Status: Within Functional Limits for tasks assessed  Exercises      General Comments        Pertinent Vitals/Pain Pain Assessment Pain Assessment: No/denies pain    Home Living                          Prior Function            PT Goals (current goals can now be found in the care plan section) Acute Rehab PT Goals PT Goal Formulation: With  patient/family Time For Goal Achievement: 01/23/22 Potential to Achieve Goals: Good Progress towards PT goals: Progressing toward goals    Frequency    Min 3X/week      PT Plan Current plan remains appropriate    Co-evaluation              AM-PAC PT "6 Clicks" Mobility   Outcome Measure  Help needed turning from your back to your side while in a flat bed without using bedrails?: A Little Help needed moving from lying on your back to sitting on the side of a flat bed without using bedrails?: A Little Help needed moving to and from a bed to a chair (including a wheelchair)?: A Little Help needed standing up from a chair using your arms (e.g., wheelchair or bedside chair)?: A Little Help needed to walk in hospital room?: A Little Help needed climbing 3-5 steps with a railing? : A Lot 6 Click Score: 17    End of Session Equipment Utilized During Treatment: Gait belt Activity Tolerance: Patient tolerated treatment well Patient left: with call bell/phone within reach;with family/visitor present;in bed Nurse Communication: Mobility status PT Visit Diagnosis: Unsteadiness on feet (R26.81);Other abnormalities of gait and mobility (R26.89);Muscle weakness (generalized) (M62.81);Difficulty in walking, not elsewhere classified (R26.2) BPPV - Right/Left : Right     Time: 4540-9811 PT Time Calculation (min) (ACUTE ONLY): 31 min  Charges:  $Gait Training: 8-22 mins $Therapeutic Activity: 8-22 mins                     Verner Mould, DPT Acute Rehabilitation Services Office [phone] Pager [phone]    Jacques Navy 01/22/2022, 4:52 PM

## 2022-01-22 NOTE — Care Management Important Message (Signed)
Important Message  Patient Details IM Letter placed in Patients room. Name: Tasha Moss MRN: [id number] Date of Birth: 29-Jan-1936   Medicare Important Message Given:  Yes     Kerin Salen 01/22/2022, 12:43 PM

## 2022-01-22 NOTE — Progress Notes (Signed)
NUTRITION NOTE  RD saw patient late morning today. Patient sitting up in the chair and her husband was at bedside. Patient had consumed a small amount of thickened apple juice (she is on FLD, nectar-thick).  G-tube remains in place. Tube feeding reached goal rate of Jevity 1.2 @ 60 ml/hr with 45 ml Prosource TF BID, 1 packet Juven BID, and 100 ml free water every 4 hours around 0040 last night.  This regimen is providing 1998 kcal, 107 grams protein, 24 grams protein, and 1762 ml free water.  Patient had an episode of diarrhea while RD was in the room. She reports no BM in several days. Documentation in the flow sheet indicates she had a medium amount of type 6 stool during BM this AM. Review of flow sheet indicates last BM prior to this was a smear of type 6 on 2/12 at 0430.  Several labs checked by this RD yesterday. Vitamin D is WDL and all others are still pending. This AM labs indicated K: 4.4 mmol/l, Phos: 3.7 mmol/l, Mg: 1.6 mg/dl.  Husband shares that someone is coming to do tube feeding pump teaching with him this afternoon. He does not think either of their daughters will be able to be present for the teaching.  Talked with patient and husband about transitioning TF regimen to 18 hours/day starting today to monitor if regimen is tolerated in anticipation of d/c home in the near future.   Will adjust TF regimen: Jevity 1.2 @ 50 ml/hr x18 hours/day (1600-1000) with 45 ml Prosource TF BID (or equivalent), 1 packet Juven BID, and 100 ml free water every 3 hours during 1600-1000 TF run time.   Recommend increasing Jevity 1.2 by 10 ml/hr every 3 days to reach goal rate of 70 ml/hr x18 hours/day (1600-1000) with 45 ml Prosource TF BID (or equivalent), 1 packet Juven BID, and 100 ml free water every 3 hours during 1600-1000 TF run time.   This regimen will provide 1782 kcal, 97 grams protein, 21 grams fiber, and 1617 ml free water.   Will request daily probiotic. Could also consider metamucil  (or equivalent) daily.   Estimated Nutritional Needs:  Kcal:  1750-1950 kcal Protein:  85-95 grams Fluid:  >/= 1.9 L/day    Jarome Matin, MS, RD, LDN Inpatient Clinical Dietitian RD pager # available in [place]  After hours/weekend pager # available in [facility]

## 2022-01-22 NOTE — Progress Notes (Signed)
[facility]                                                                               Tasha Moss, is a 86 y.o. female, DOB - Sep 14, 1936, KWI:[id number] Admit date - 01/07/2022    Outpatient Primary MD for the patient is [name], MD  LOS - 14  days    Brief summary   86 yo F with hx BPPV, hypertension, HLD, hypothyroidism s/p partial thyroidectomy, GERD, esophageal stricture who presented with generalized weakness.  Sounds like was admitted with closed left hip dislocation s/p reduction in November and was discharged to SNF.  She caught COVID in rehab and has subsequently developed progressive vertigo with nausea and vomiting.  She's also had progressive dysphagia and issues with passage of solid food.  She had barium swallow on 1/19 where she aspirated into the trachea and plan was for MBS at [facility] this week.  She continued to have dizziness with vertigo and N/V.  She's developed progressive weakness.  She also had worsening edema and imaging concerning for HF.  She's been admitted with failure to thrive.    PEG tube placed on 2/9, tube feeds started on 2/10.  Patient was however not able to tolerate bolus feeds.  Hence, continue feeds resumed.   Assessment & Plan    Assessment and Plan: * Failure to thrive in adult- (present on admission) - Multifactorial, related to vertigo, dysphagia, poor nutritional status   Malnutrition of moderate degree - Multifactorial, likely due to oropharyngeal dysphagia, poor nutritional status, chronic illness -Continue tube feeds with free water  Sacral decubitus ulcer, stage II (HCC) - Appreciate wound care recommendations -Area examined, recommending and some area with sloughing skin, continue Keflex for cellulitis  Acute CHF (congestive heart failure) (HCC) -CT with CHF, cardiomegaly, pulm vascular prominence, trace effusions, interstitial edema, possibly early alveolar edema.  Received Lasix x1. -Appears to be  compensated and euvolemic -2D echo 2/1 showed EF of 65%, G1 DD  Dysphagia - Followed outpatient by GI, underwent upper GI series as outpatient on 1/19 with aspiration of contrast into trachea at the level of mainstem bronchi.  -SLP consulted.  MBS showed pharyngeal esophageal and pharyngeal phase dysphagia-chronic due to position of cervical spine with exacerbation related to her current illness/deconditioning -Recommended full liquid, nectar thick diet. -EGD on 2/3, esophageal dilation.  Recommended PEG. -Palliative medicine consulted and patient, family wishes to proceed with PEG -Underwent PEG placement on 2/9, was not able to tolerate bolus feeds. -Placed back on continuous feeds on 2/12 and has been tolerating it.  No further nausea vomiting or diarrhea. -Plan is to have continuous feed at night at home with full liquid, nectar thick diet during the day if patient is able to tolerate. -Patient is currently tolerating continuous tube feeding, has diarrhea today   Benign paroxysmal positional vertigo- (present on admission) - Continue scopolamine patch, avoid Dramamine due to anticholinergic side effects.   -CT head on 1/30 head showed no acute abnormality.   -Continue PT OT vestibular rehab  Hypothyroidism- (present on admission) - TSH mildly elevated -Follow-up outpatient after acute illness is resolved    Code Status:  Full CODE STATUS DVT Prophylaxis:  enoxaparin (LOVENOX) injection 40 mg Start: 01/07/22 2200   Level of Care: Level of care: Telemetry Family Communication: Updated patient's husband at the bedside  Disposition Plan:     Remains inpatient appropriate: Hopefully DC home in a.m., needs tube feeding teaching with pump  Procedures:  PEG tube placement  Consultants:    Gastroenterology General surgery IR  Antimicrobials:  Keflex 01/21/2022--->  Medications   cephALEXin  500 mg Oral Q6H   cholecalciferol  1,000 Units Oral Daily   enoxaparin (LOVENOX)  injection  40 mg Subcutaneous Q24H   feeding supplement  237 mL Oral TID BM   feeding supplement (PROSource TF)  45 mL Per Tube Daily   FLUoxetine  20 mg Oral Daily   free water  100 mL Per Tube 6 X Daily   Gerhardt's butt cream   Topical QID   ketorolac  15 mg Intravenous Q6H   levothyroxine  125 mcg Oral QAC breakfast   loratadine  10 mg Oral Daily   multivitamin with minerals  1 tablet Oral Daily   nutrition supplement (JUVEN)  1 packet Per Tube BID BM   pantoprazole  40 mg Oral BID   polyvinyl alcohol  2 drop Both Eyes Daily   saccharomyces boulardii  250 mg Per Tube BID   scopolamine  1 patch Transdermal Q72H   thiamine  100 mg Oral Daily   vitamin B-12  250 mcg Oral Daily     Subjective:   Tasha Moss was seen and examined today.  Having diarrhea this morning, no nausea or vomiting, feels generalized weakness.  Husband at the bedside.  No abdominal pain.  No fevers or chills.  Objective:   Vitals:   01/21/22 2109 01/22/22 0413 01/22/22 0500 01/22/22 1213  BP: (!) 145/73 130/70  (!) 149/71  Pulse: (!) 103 88  (!) 107  Resp:  18  18  Temp: 98.5 F (36.9 C) 97.7 F (36.5 C)  (!) 97.5 F (36.4 C)  TempSrc: Oral Oral  Oral  SpO2: 93% 90% 92% 96%  Weight:      Height:        Intake/Output Summary (Last 24 hours) at 01/22/2022 1553 Last data filed at 01/22/2022 0442 Gross per 24 hour  Intake 773.5 ml  Output 2 ml  Net 771.5 ml   Filed Weights   01/16/22 0416 01/17/22 0500 01/21/22 0500  Weight: 55.3 kg 55.3 kg 63 kg     Exam General: Alert and oriented x 3, NAD Cardiovascular: S1 S2 auscultated,  RRR Respiratory: Clear to auscultation bilaterally, no wheezing, rales or rhonchi Gastrointestinal: Soft, nontender, nondistended, + bowel sounds, PEG tube Ext: no pedal edema bilaterally Neuro: generalized debility, no focal neurological deficits Skin: No rashes Psych: Normal affect and demeanor, alert and oriented x3    Data Reviewed:  I have personally  reviewed following labs   Last metabolic panel Lab Results  Component Value Date   NA 134 (L) 01/22/2022   K 4.4 01/22/2022   CL 98 01/22/2022   CO2 31 01/22/2022   BUN 20 01/22/2022   CREATININE 0.37 (L) 01/22/2022   GLUCOSE 131 (H) 01/22/2022   GFRNONAA >60 01/22/2022   GFRAA >60 02/19/2016   CALCIUM 8.2 (L) 01/22/2022   PHOS 3.7 01/22/2022   PROT 5.5 (L) 01/16/2022   ALBUMIN 2.8 (L) 01/16/2022   BILITOT 0.4 01/16/2022   ALKPHOS 62 01/16/2022   AST 19 01/16/2022   ALT 17 01/16/2022  ANIONGAP 5 01/22/2022    CBG (last 3)  Recent Labs    01/22/22 0410 01/22/22 0754 01/22/22 1153  GLUCAP 129* 118* 120*        Jozey Janco M.D. [facility] 01/22/2022, 3:53 PM  Available via Epic secure chat 7am-7pm After 7 pm, please refer to night coverage provider listed on amion.

## 2022-01-22 NOTE — Telephone Encounter (Signed)
[facility] [place] Progress report placed in bin to be completed

## 2022-01-23 LAB — GLUCOSE, CAPILLARY
Glucose-Capillary: 147 mg/dL — ABNORMAL HIGH (ref 70–99)
Glucose-Capillary: 137 mg/dL — ABNORMAL HIGH (ref 70–99)
Glucose-Capillary: 118 mg/dL — ABNORMAL HIGH (ref 70–99)

## 2022-01-23 MED ORDER — LOPERAMIDE HCL 2 MG PO CAPS: 2.0000 mg | ORAL_CAPSULE | Freq: Four times a day (QID) | ORAL | Status: DC | PRN

## 2022-01-23 MED ORDER — THIAMINE HCL 100 MG PO TABS: 100.0000 mg | ORAL_TABLET | Freq: Every day | ORAL | 0 refills | Status: DC

## 2022-01-23 MED ORDER — FREE WATER: 100.0000 mL | Freq: Every day | Status: DC

## 2022-01-23 MED ORDER — JUVEN PO PACK: 1.0000 | PACK | Freq: Two times a day (BID) | ORAL | 0 refills | Status: DC

## 2022-01-23 MED ORDER — MECLIZINE HCL 12.5 MG PO TABS: 12.5000 mg | ORAL_TABLET | Freq: Three times a day (TID) | ORAL | 0 refills | Status: AC | PRN

## 2022-01-23 MED ORDER — OXYCODONE HCL 5 MG/5ML PO SOLN: 5.0000 mg | Freq: Four times a day (QID) | ORAL | 0 refills | Status: AC | PRN

## 2022-01-23 MED ORDER — GERHARDT'S BUTT CREAM: 1.0000 "application " | TOPICAL_CREAM | Freq: Four times a day (QID) | CUTANEOUS | Status: DC

## 2022-01-23 MED ORDER — PROSOURCE TF PO LIQD: 45.0000 mL | Freq: Every day | ORAL | Status: DC

## 2022-01-23 MED ORDER — SACCHAROMYCES BOULARDII 250 MG PO CAPS: 250.0000 mg | ORAL_CAPSULE | Freq: Two times a day (BID) | ORAL | 0 refills | Status: AC

## 2022-01-23 MED ORDER — LOPERAMIDE HCL 2 MG PO CAPS
2.0000 mg | ORAL_CAPSULE | Freq: Four times a day (QID) | ORAL | 0 refills | Status: AC | PRN
Start: 2022-01-23 — End: 2022-01-30

## 2022-01-23 MED ORDER — MAGNESIUM SULFATE 2 GM/50ML IV SOLN
2.0000 g | Freq: Once | INTRAVENOUS | Status: AC
  Administered 2022-01-23: 2 g via INTRAVENOUS
  Filled 2022-01-23: qty 50

## 2022-01-23 MED ORDER — JEVITY 1.5 CAL/FIBER PO LIQD: 50.0000 mL/h | ORAL | Status: DC

## 2022-01-23 MED ORDER — ENSURE ENLIVE PO LIQD: 237.0000 mL | Freq: Three times a day (TID) | ORAL | 12 refills | Status: DC

## 2022-01-23 MED ORDER — CEPHALEXIN 500 MG PO CAPS: 500.0000 mg | ORAL_CAPSULE | Freq: Four times a day (QID) | ORAL | 0 refills | Status: AC

## 2022-01-23 NOTE — Telephone Encounter (Signed)
This was entered in error.  No forms to complete

## 2022-01-23 NOTE — TOC Progression Note (Addendum)
Transition of Care [facility]) - Progression Note    Patient Details  Name: Tasha Moss MRN: [id number] Date of Birth: [birth year]  [facility] CM/SW Contact  Leeroy Cha, RN Phone Number: 01/23/2022, 8:47 AM  Clinical Narrative:    Following for toc needs Patient being discharged to home . Per Pam chandler everything for the home care and tube feeds is in place.  Expected Discharge Plan: [place] Barriers to Discharge: Continued Medical Work up  Expected Discharge Plan and Services Expected Discharge Plan: [place]   Discharge Planning Services: CM Consult   Living arrangements for the past 2 months: Single Family Home                 DME Arranged: Tube feeding pump DME Agency: [employer] Date DME Agency Contacted: 01/18/22 Time DME Agency Contacted: [phone]   HH Arranged: RN [place] Agency: [place] Date HH Agency Contacted: 01/21/22 Time HH Agency Contacted: [phone] Representative spoke with at [place]: [place] Determinants of Health ([place]) Interventions    Readmission Risk Interventions No flowsheet data found.

## 2022-01-23 NOTE — Discharge Summary (Addendum)
Physician Discharge Summary  Tasha Moss TKP:[id number] DOB: [birth year] DOA: 01/07/2022  PCP: [name], MD  Admit date: 01/07/2022 Discharge date: 01/23/2022  Admitted From: Home Discharge disposition: Home with home health PT   History of Present Illness / Brief narrative:  Patient is an 86 yo F with hx BPPV, hypertension, HLD, hypothyroidism s/p partial thyroidectomy, GERD, esophageal stricture. Patient presented to the ED on 01/07/2022 with generalized weakness. In November 2022, she was admitted with closed left hip dislocation s/p reduction and was discharged to SNF.  She caught COVID in the rehab, subsequently developed progressive vertigo with nausea, vomiting. She's also had progressive dysphagia and issues with passage of solid food.  She had barium swallow on 1/19 where she aspirated into the trachea and was plan for MBS as an outpatient.   In the interval, her dizziness, vertigo, nausea, vomiting worsened.  She became progressively more weak and was admitted to the hospital for failure to thrive. On 2/9, patient had PEG tube placed and was started on bolus tube feeding which she was not able to tolerate and was switched to continuous tube feeding.  Subjective:  Seen and examined this morning.  Pleasant elderly Caucasian female.  Lying on bed.  Not in distress.  Alert, awake, oriented x3 with slight difficulty.  Her daughter who is a Marine scientist in the [employer] was present at bedside  Hospital Course:  Failure to thrive in adult- (present on admission) Moderate malnutrition -Multifactorial: Advanced age, dysphagia, chronic vertigo, nausea vomiting -Nutrition consult appreciated -2/9, patient underwent PEG tube placement.  Currently on nocturnal tube feeding.  -Continue the same at home along with supplements.  Sacral decubitus ulcer stage II -Continue wound care recommendations.  Currently on a course of Keflex for cellulitis.   Acute CHF (congestive heart failure) (HCC) -CT  scan of chest obtained on admission showed cardiomegaly, pulmonary vascular prominence, interstitial edema and trace bilateral pleural effusion suggestive of CHF exacerbation.   -She received 1 dose of IV Lasix.  Euvolemic after that.   -2D echo on 2/1 with EF 65%, G1 DD. -Currently not on Lasix and actually at risk of dehydration.   Dysphagia - Followed outpatient by GI, underwent upper GI series as outpatient on 1/19 with aspiration of contrast into trachea at the level of mainstem bronchi.  -SLP consulted.  MBS showed pharyngeal esophageal and pharyngeal phase dysphagia-chronic due to position of cervical spine with exacerbation related to her current illness/deconditioning -Recommended full liquid, nectar thick diet. -EGD on 2/3, esophageal dilation.  Recommended PEG. -Palliative medicine consulted and patient wished to proceed with PEG -Underwent PEG placement on 2/9, was not able to tolerate bolus feeds. -Placed back on continuous feeds on 2/12 and has been tolerating it.  No further nausea vomiting or diarrhea. -Plan is to discharge home today to continue nocturnal tube feeding and allow full liquid with thickeners during the day.  -As needed Imodium for diarrhea.     Benign paroxysmal positional vertigo- (present on admission) -CT head on 1/30 head showed no acute abnormality.   -Continue PT OT vestibular rehab -As needed meclizine for vertigo.  I would avoid scopolamine because of persistent dryness of mouth   Hypothyroidism- (present on admission) -TSH mildly elevated -Follow-up as an outpatient.   Mobility -Home with PT recommended by physical therapy  Goals of care --  Code Status: Full Code   Nutritional status:  Body mass index is 22.13 kg/m.  Nutrition Problem: Moderate Malnutrition Etiology: chronic illness, dysphagia Signs/Symptoms:  moderate fat depletion, moderate muscle depletion Diet:  Diet Order             Diet general           Diet full liquid Room  service appropriate? Yes; Fluid consistency: Nectar Thick  Diet effective now                    Wounds:  - Incision (Closed) 02/27/16 Shoulder Left (Active)  Date First Assessed/Time First Assessed: 02/27/16 1258   Location: Shoulder  Location Orientation: Left    Assessments 02/27/2016  1:32 PM 03/01/2016  8:00 AM  Dressing Type Adhesive bandage Silicone dressing  Dressing Clean;Dry;Intact Clean;Dry;Intact  Site / Wound Assessment -- Dressing in place / Unable to assess  Drainage Amount -- None     No Linked orders to display     Pressure Injury 01/07/22 Perineum Right (Active)  Date First Assessed/Time First Assessed: 01/07/22 2130   Location: Perineum  Location Orientation: Right  Present on Admission: Yes    Assessments 01/07/2022 11:00 PM 01/23/2022  6:30 AM  Dressing Type Foam - Lift dressing to assess site every shift Moisture barrier  Dressing Clean;Dry --  Dressing Change Frequency -- Twice a day  State of Healing -- Early/partial granulation  Site / Wound Assessment -- Clean;Painful;Red  Peri-wound Assessment Pink Erythema (blanchable);Other (Comment)  Wound Length (cm) 2 cm --  Wound Width (cm) 1.5 cm --  Wound Surface Area (cm^2) 3 cm^2 --  Drainage Amount Scant --  Treatment Cleansed Cleansed     No Linked orders to display     Incision (Closed) 01/17/22 Abdomen (Active)  Date First Assessed/Time First Assessed: 01/17/22 1359   Location: Abdomen    Assessments 01/17/2022  2:15 PM 01/22/2022  8:28 PM  Dressing Type Liquid skin adhesive Liquid skin adhesive  Dressing -- Clean, Dry, Intact  Site / Wound Assessment Clean;Dry Clean;Dry  Margins Attached edges (approximated) Attached edges (approximated)  Closure -- Skin glue  Drainage Amount -- None  Drainage Description -- No odor  Treatment Ice applied --     No Linked orders to display    Discharge Exam:   Vitals:   01/22/22 1213 01/22/22 2032 01/23/22 0409 01/23/22 0419  BP: (!) 149/71 133/66 115/64    Pulse: (!) 107 (!) 104 92   Resp: 18 17 17    Temp: (!) 97.5 F (36.4 C) 99 F (37.2 C) 98.3 F (36.8 C)   TempSrc: Oral Oral Oral   SpO2: 96% 90% 92%   Weight:    60.3 kg  Height:        Body mass index is 22.13 kg/m.  General exam: Pleasant elderly Caucasian female.  Not in physical distress Skin: No rashes, lesions or ulcers. HEENT: Atraumatic, normocephalic, no obvious bleeding Lungs: Clear to auscultate bilaterally CVS: Regular rate and rhythm, no murmur GI/Abd soft, nontender, nondistended, bowel sound present.  PEG tube site intact CNS: Alert, awake, oriented x3 with difficulty.  Able to follow command. Psychiatry: Mood appropriate Extremities: No pedal edema, no calf tenderness    Follow ups:    Follow-up Information     [facility]. Go on 02/05/2022.   Specialty: General Surgery Why: 9:00 AM for post-op check. Please arrive 30 min prior to appointment time. Have ID and insurance card with you. Contact information: [address] [place] [address] [phone]         [name], MD Follow up.  Specialty: Family Medicine Contact information: [phone] [place] [address] [phone]                 Discharge Instructions:   Discharge Instructions     Call MD for:  difficulty breathing, headache or visual disturbances   Complete by: As directed    Call MD for:  extreme fatigue   Complete by: As directed    Call MD for:  hives   Complete by: As directed    Call MD for:  persistant dizziness or light-headedness   Complete by: As directed    Call MD for:  persistant nausea and vomiting   Complete by: As directed    Call MD for:  severe uncontrolled pain   Complete by: As directed    Call MD for:  temperature >100.4   Complete by: As directed    Diet general   Complete by: As directed    Nocturnal tube feeding.  Full liquid diet with thickener in the day   Discharge instructions    Complete by: As directed    General discharge instructions: Follow with Primary MD [name], MD in 7 days  Please request your PCP  to go over your hospital tests, procedures, radiology results at the follow up. Please get your medicines reviewed and adjusted.  Your PCP may decide to repeat certain labs or tests as needed. Do not drive, operate heavy machinery, perform activities at heights, swimming or participation in water activities or provide baby sitting services if your were admitted for syncope or siezures until you have seen by Primary MD or a Neurologist and advised to do so again. [place] Controlled Substance Reporting System database was reviewed. Do not drive, operate heavy machinery, perform activities at heights, swim, participate in water activities or provide baby-sitting services while on medications for pain, sleep and mood until your outpatient physician has reevaluated you and advised to do so again.  You are strongly recommended to comply with the dose, frequency and duration of prescribed medications. Activity: As tolerated with Full fall precautions use walker/cane & assistance as needed Avoid using any recreational substances like cigarette, tobacco, alcohol, or non-prescribed drug. If you experience worsening of your admission symptoms, develop shortness of breath, life threatening emergency, suicidal or homicidal thoughts you must seek medical attention immediately by calling 911 or calling your MD immediately  if symptoms less severe. You must read complete instructions/literature along with all the possible adverse reactions/side effects for all the medicines you take and that have been prescribed to you. Take any new medicine only after you have completely understood and accepted all the possible adverse reactions/side effects.  Wear Seat belts while driving. You were cared for by a hospitalist during your hospital stay. If you have any questions about your  discharge medications or the care you received while you were in the hospital after you are discharged, you can call the unit and ask to speak with the hospitalist or the covering physician. Once you are discharged, your primary care physician will handle any further medical issues. Please note that NO REFILLS for any discharge medications will be authorized once you are discharged, as it is imperative that you return to your primary care physician (or establish a relationship with a primary care physician if you do not have one).   Discharge wound care:   Complete by: As directed    Increase activity slowly   Complete by: As directed  Discharge Medications:   Allergies as of 01/23/2022       Reactions   Gabapentin    hallucinations   Other    Strong pain medications cause n/v   Morphine    Nausea & vomiting        Medication List     STOP taking these medications    dimenhyDRINATE 50 MG tablet Commonly known as: DRAMAMINE   polyethylene glycol powder 17 GM/SCOOP powder Commonly known as: GLYCOLAX/MIRALAX   scopolamine 1 MG/3DAYS Commonly known as: TRANSDERM-SCOP       TAKE these medications    acetaminophen 500 MG tablet Commonly known as: TYLENOL Take 500 mg by mouth every 6 (six) hours as needed for moderate pain.   Artificial Tears 5-6 MG/ML Soln Generic drug: Polyvinyl Alcohol-Povidone Place 2 drops into both eyes daily.   cephALEXin 500 MG capsule Commonly known as: KEFLEX Take 1 capsule (500 mg total) by mouth every 6 (six) hours for 7 days.   Cranberry 200 MG Caps Take 1 capsule by mouth daily.   diclofenac Sodium 1 % Gel Commonly known as: VOLTAREN Apply 2 g topically 4 (four) times daily as needed (Pain).   feeding supplement Liqd Take 237 mLs by mouth 3 (three) times daily between meals. What changed: when to take this   feeding supplement (PROSource TF) liquid Place 45 mLs into feeding tube daily. What changed: You were already  taking a medication with the same name, and this prescription was added. Make sure you understand how and when to take each.   nutrition supplement (JUVEN) Pack Place 1 packet into feeding tube 2 (two) times daily between meals. What changed: You were already taking a medication with the same name, and this prescription was added. Make sure you understand how and when to take each.   feeding supplement (JEVITY 1.5 CAL/FIBER) Liqd Place 50 mL/hr into feeding tube continuous. Dose to be titrated per RD at home. What changed: You were already taking a medication with the same name, and this prescription was added. Make sure you understand how and when to take each.   FLUoxetine 20 MG capsule Commonly known as: PROZAC Take 1 capsule (20 mg total) by mouth daily.   fluticasone 50 MCG/ACT nasal spray Commonly known as: FLONASE Place 1 spray into both nostrils daily.   free water Soln Place 100 mLs into feeding tube 6 (six) times daily.   Gerhardt's butt cream Crea Apply 1 application topically 4 (four) times daily.   levothyroxine 125 MCG tablet Commonly known as: SYNTHROID Take 1 tablet (125 mcg total) by mouth daily before breakfast.   loperamide 2 MG capsule Commonly known as: IMODIUM Take 1 capsule (2 mg total) by mouth every 6 (six) hours as needed for up to 7 days for diarrhea or loose stools.   loratadine 10 MG tablet Commonly known as: CLARITIN Take 10 mg by mouth daily.   meclizine 12.5 MG tablet Commonly known as: ANTIVERT Take 1 tablet (12.5 mg total) by mouth 3 (three) times daily as needed for up to 10 days for dizziness.   meloxicam 7.5 MG tablet Commonly known as: MOBIC Take 1 tablet (7.5 mg total) by mouth 2 (two) times daily.   MULTIVITAMIN PO Take 400 mg by mouth daily.   ondansetron 8 MG tablet Commonly known as: ZOFRAN Take 1 tablet (8 mg total) by mouth 3 (three) times daily before meals. What changed:  when to take this reasons to take this    oxyCODONE  5 MG/5ML solution Commonly known as: ROXICODONE Place 5 mLs (5 mg total) into feeding tube every 6 (six) hours as needed for up to 5 days for moderate pain or severe pain (2.5 mg for moderate, 5 mg for severe).   pantoprazole 40 MG tablet Commonly known as: PROTONIX Take 1 tablet (40 mg total) by mouth daily.   saccharomyces boulardii 250 MG capsule Commonly known as: FLORASTOR Place 1 capsule (250 mg total) into feeding tube 2 (two) times daily for 7 days.   thiamine 100 MG tablet Take 1 tablet (100 mg total) by mouth daily.   UNABLE TO FIND TUBE FEEDS: 1/2 carton (118 ml) Jevity 1.5 QID with 60 ml free water before and 60 ml free water after each TF bolus and 45 ml Prosource TF (or equivalent) BID.    Maintain this regimen x4 days.    Then increase to 1 carton (237 ml) Jevity 1.5 QID with 60 ml free water before and 60 ml free water after each TF bolus and 45 ml Prosource TF (or equivalent) BID.    This goal regimen will provide 1500 kcal (86% kcal need), 82 grams protein (96% protein need), and 1200 ml free water.   vitamin B-12 250 MCG tablet Commonly known as: CYANOCOBALAMIN Take 250 mcg by mouth daily.   Vitamin D3 25 MCG (1000 UT) Caps Take 1,000 Units by mouth daily.               Discharge Care Instructions  (From admission, onward)           Start     Ordered   01/23/22 0000  Discharge wound care:        01/23/22 1142             The results of significant diagnostics from this hospitalization (including imaging, microbiology, ancillary and laboratory) are listed below for reference.    Procedures and Diagnostic Studies:   DG Chest 1 View  Result Date: 01/07/2022 CLINICAL DATA:  Weakness EXAM: CHEST  1 VIEW COMPARISON:  11/19/2021 chest radiograph. FINDINGS: Partially visualized left total shoulder arthroplasty. Stable cardiomediastinal silhouette with mild cardiomegaly. No pneumothorax. No pleural effusion. Chronic moderate  elevation of the right hemidiaphragm. No pulmonary edema. No acute consolidative airspace disease. Thyroidectomy clips in the medial lower right neck. IMPRESSION: Mild cardiomegaly without pulmonary edema. No active pulmonary disease. Electronically Signed   By: Ilona Sorrel M.D.   On: 01/07/2022 15:56   CT Head Wo Contrast  Result Date: 01/07/2022 CLINICAL DATA:  A female at age 89 presents with dizziness in decreased appetite. EXAM: CT HEAD WITHOUT CONTRAST TECHNIQUE: Contiguous axial images were obtained from the base of the skull through the vertex without intravenous contrast. RADIATION DOSE REDUCTION: This exam was performed according to the departmental dose-optimization program which includes automated exposure control, adjustment of the mA and/or kV according to patient size and/or use of iterative reconstruction technique. COMPARISON:  October 23, 2020. FINDINGS: Brain: No evidence of acute infarction, hemorrhage, hydrocephalus, extra-axial collection or mass lesion/mass effect. Mild generalized atrophy. Vascular: No hyperdense vessel or unexpected calcification. Skull: Normal. Negative for fracture or focal lesion. Sinuses/Orbits: Visualized paranasal sinuses and orbits without acute process Other: None IMPRESSION: 1. No acute intracranial abnormality. 2. Mild generalized atrophy. Electronically Signed   By: Zetta Bills M.D.   On: 01/07/2022 16:51   CT Chest W Contrast  Result Date: 01/07/2022 CLINICAL DATA:  Nausea and vomiting.  Respiratory illness. EXAM: CT CHEST, ABDOMEN, AND PELVIS  WITH CONTRAST TECHNIQUE: Multidetector CT imaging of the chest, abdomen and pelvis was performed following the standard protocol during bolus administration of intravenous contrast. RADIATION DOSE REDUCTION: This exam was performed according to the departmental dose-optimization program which includes automated exposure control, adjustment of the mA and/or kV according to patient size and/or use of iterative  reconstruction technique. CONTRAST:  164mL OMNIPAQUE IOHEXOL 300 MG/ML  SOLN COMPARISON:  Chest x-ray earlier the same day FINDINGS: CT CHEST FINDINGS Cardiovascular: Heart is enlarged. No pericardial effusion identified. Main pulmonary artery is normal caliber. Thoracic aorta is tortuous and normal caliber with moderate atherosclerotic plaques. Mediastinum/Nodes: No bulky axillary, mediastinal or hilar lymphadenopathy identified. Multiple calcified prevascular mediastinal lymph nodes noted. Lungs/Pleura: Diffuse interlobular septal thickening with mild mosaic attenuation of the lungs. Pulmonary vascular prominence. Biapical pleural thickening. Trace bilateral pleural effusions with associated mild subsegmental atelectasis at the lung bases. No focal consolidation identified. Diffuse mild bronchial wall thickening. No pneumothorax. Musculoskeletal: Degenerative changes of the spine. No suspicious bony lesions identified. CT ABDOMEN PELVIS FINDINGS Hepatobiliary: Liver is normal in size and contour with no suspicious mass identified. Gallbladder appears normal. No biliary ductal dilatation identified. Pancreas: Unremarkable. No pancreatic ductal dilatation or surrounding inflammatory changes. Spleen: Normal in size without focal abnormality. Adrenals/Urinary Tract: Mild nodularity of the left adrenal gland measuring up to 8 mm in thickness. Symmetric perfusion of the kidneys. No hydronephrosis or enhancing renal mass identified bilaterally. No urinary bladder wall mass identified, significantly limited evaluation due to metallic artifacts from the bilateral hips. Stomach/Bowel: No bowel obstruction, free air or pneumatosis. No bowel wall edema or thickening identified. Colonic diverticulosis. No evidence of acute appendicitis. Vascular/Lymphatic: Aortic atherosclerosis. No enlarged abdominal or pelvic lymph nodes. Reproductive: Retroverted uterus. No suspicious adnexal mass appreciated. Other: No ascites.  Musculoskeletal: Advanced degenerative changes of the lumbar spine. Bilateral hip arthroplasties. No suspicious bony lesions identified. IMPRESSION: 1. Evidence of CHF. Cardiomegaly, pulmonary vascular prominence, trace pleural effusions, interstitial edema and possibly early alveolar edema. 2. No acute process identified in the abdomen or pelvis. 3. Colonic diverticulosis. 4. Other chronic findings as described. Electronically Signed   By: Ofilia Neas M.D.   On: 01/07/2022 16:58   CT Abdomen Pelvis W Contrast  Result Date: 01/07/2022 CLINICAL DATA:  Nausea and vomiting.  Respiratory illness. EXAM: CT CHEST, ABDOMEN, AND PELVIS WITH CONTRAST TECHNIQUE: Multidetector CT imaging of the chest, abdomen and pelvis was performed following the standard protocol during bolus administration of intravenous contrast. RADIATION DOSE REDUCTION: This exam was performed according to the departmental dose-optimization program which includes automated exposure control, adjustment of the mA and/or kV according to patient size and/or use of iterative reconstruction technique. CONTRAST:  136mL OMNIPAQUE IOHEXOL 300 MG/ML  SOLN COMPARISON:  Chest x-ray earlier the same day FINDINGS: CT CHEST FINDINGS Cardiovascular: Heart is enlarged. No pericardial effusion identified. Main pulmonary artery is normal caliber. Thoracic aorta is tortuous and normal caliber with moderate atherosclerotic plaques. Mediastinum/Nodes: No bulky axillary, mediastinal or hilar lymphadenopathy identified. Multiple calcified prevascular mediastinal lymph nodes noted. Lungs/Pleura: Diffuse interlobular septal thickening with mild mosaic attenuation of the lungs. Pulmonary vascular prominence. Biapical pleural thickening. Trace bilateral pleural effusions with associated mild subsegmental atelectasis at the lung bases. No focal consolidation identified. Diffuse mild bronchial wall thickening. No pneumothorax. Musculoskeletal: Degenerative changes of the  spine. No suspicious bony lesions identified. CT ABDOMEN PELVIS FINDINGS Hepatobiliary: Liver is normal in size and contour with no suspicious mass identified. Gallbladder appears normal. No biliary ductal dilatation identified. Pancreas:  Unremarkable. No pancreatic ductal dilatation or surrounding inflammatory changes. Spleen: Normal in size without focal abnormality. Adrenals/Urinary Tract: Mild nodularity of the left adrenal gland measuring up to 8 mm in thickness. Symmetric perfusion of the kidneys. No hydronephrosis or enhancing renal mass identified bilaterally. No urinary bladder wall mass identified, significantly limited evaluation due to metallic artifacts from the bilateral hips. Stomach/Bowel: No bowel obstruction, free air or pneumatosis. No bowel wall edema or thickening identified. Colonic diverticulosis. No evidence of acute appendicitis. Vascular/Lymphatic: Aortic atherosclerosis. No enlarged abdominal or pelvic lymph nodes. Reproductive: Retroverted uterus. No suspicious adnexal mass appreciated. Other: No ascites. Musculoskeletal: Advanced degenerative changes of the lumbar spine. Bilateral hip arthroplasties. No suspicious bony lesions identified. IMPRESSION: 1. Evidence of CHF. Cardiomegaly, pulmonary vascular prominence, trace pleural effusions, interstitial edema and possibly early alveolar edema. 2. No acute process identified in the abdomen or pelvis. 3. Colonic diverticulosis. 4. Other chronic findings as described. Electronically Signed   By: Ofilia Neas M.D.   On: 01/07/2022 16:58   DG Swallowing Func-Speech Pathology  Result Date: 01/08/2022 Table formatting from the original result was not included. Objective Swallowing Evaluation: Type of Study: MBS-Modified Barium Swallow Study  Patient Details Name: Tasha Moss MRN: [id number] Date of Birth: May 23, 1936 Today's Date: 01/08/2022 Time: SLP Start Time (ACUTE ONLY): 1500 -SLP Stop Time (ACUTE ONLY): 9323 SLP Time Calculation  (min) (ACUTE ONLY): 35 min Past Medical History: Past Medical History: Diagnosis Date  Allergy   seasonal & dust  Anxiety   Arthritis   DJD  Family history of adverse reaction to anesthesia   n/v   GERD (gastroesophageal reflux disease)   History of vertigo   Hyperlipidemia   Hypertension   Hypothyroidism   PONV (postoperative nausea and vomiting)   Thyroid disease   hypothyroidism post Hashimoto's & partial thyroidectomy Past Surgical History: Past Surgical History: Procedure Laterality Date  CATARACT EXTRACTION W/ INTRAOCULAR LENS  IMPLANT, BILATERAL    COLONOSCOPY    diverticulosis  JOINT REPLACEMENT  1995 & 1999  THR bilaterally   lumbar disc repair  1989  Dr Arrie Aran  SPINE SURGERY    cervical ; Dr Vertell Limber  THYROIDECTOMY, PARTIAL    benign nodule  TONSILLECTOMY AND ADENOIDECTOMY    TOTAL SHOULDER ARTHROPLASTY Left 02/27/2016  Procedure: LEFT REVERSE TOTAL SHOULDER ARTHROPLASTY;  Surgeon: Renette Butters, MD;  Location: [facility];  Service: Orthopedics;  Laterality: Left;  vitreous detachment    Dr Claudean Kinds HPI: per admitting MD note "Patient was hospitalized back in November with a dislocated left total hip arthroplasty status post reduction and was discharged to SNF.  Also contracted COVID in rehab.  She subsequently developed worsening vertigo with nausea and vomiting thought to be due to bilateral vestibular disorder.  She also has progressive worsening dysphagia and issues with passage of solid food. She was referred to GI and had barium swallow on 1/19  but aspirated contrast. Now has modified barium swallow planned at [facility] later this week."  Esophagram was unremarkable except for aspiraiton.  MBS ordered in house for pt due to pt's ongoing dysphagia issues. Per interview with pt and pt's daughter, [name], pt has been having dysphagia for approx six months.  Dysphagia coupled with gustatory deficits have caused pt to lose a significant amount of weight - most notably since November.  Pt states food  stick in her throat causing her to drink liquids to clear or cough and expectorate.  She has undergone prior esophageal dilatation and daughter reports  pt has problems with pills. Pt also with h/o cervical spine surgery and thyroid surgery.  Subjective: pt awake in chair, daughter *([name], RN and pt HCPOA) present  Recommendations for follow up therapy are one component of a multi-disciplinary discharge planning process, led by the attending physician.  Recommendations may be updated based on patient status, additional functional criteria and insurance authorization. Assessment / Plan / Recommendation Clinical Impressions 01/08/2022 Clinical Impression Patient presents with severe pharyngocervical dysphagia with both sensorimotor and obstructive dysphagia resulting in gross retention of barium throughout pharynx and aspiration of thin liquids.  Pt with inadequate epiglottic deflection due to it contacting posterior pharyngeal wall resulting in retention.  Her prior ACDF prevents her from conducing postures including chin tuck, head turn,etc.  With thin, pt aspirates due to decreased laryngeal closure and pharyngeal retention spilling into larynx during and after swallow.  Pt does have cough response but this did not clear aspirates.  Pt tested with HOB reclined to 30* to determine if would maintain boluses in posterior pharynx decreasing aspiration - however pt was noted to begin gagging after cued to "hock" and reports gagging occurs frequently during meals and thus repositioned her to upright position.  Decreasing amount to tsps prevented aspiration of thin.  Nectar cup boluses were not aspirated but were retained in pharynx.  Pt swallowing of cracker resulted in severe pharyngeal retention - causing her to reflexively expectorate x2.  Suspect pt has component of chronic dysphagia due to position of cervical spine with current exacerbation due to current illness/deconditioning.  Concern present for pt's nutrition  and aspiration risk given level of dysphagia.  Holly, daughter, present and educated to findings/recommendations. SLP Visit Diagnosis Dysphagia, pharyngoesophageal phase (R13.14);Dysphagia, pharyngeal phase (R13.13) Attention and concentration deficit following -- Frontal lobe and executive function deficit following -- Impact on safety and function Risk for inadequate nutrition/hydration;Moderate aspiration risk   Treatment Recommendations 01/08/2022 Treatment Recommendations Therapy as outlined in treatment plan below   Prognosis 01/08/2022 Prognosis for Safe Diet Advancement Guarded Barriers to Reach Goals Severity of deficits Barriers/Prognosis Comment -- Diet Recommendations 01/08/2022 SLP Diet Recommendations Nectar thick liquid;Thin liquid Liquid Administration via nectar via cup or straw, thin via tsp Medication Administration suspension Compensations Follow solids with liquid;Small sips/bites;Slow rate; multiple swallows, throat clear and re-swallow Postural Changes Remain semi-upright after after feeds/meals (Comment);Seated upright at 90 degrees   Other Recommendations 01/08/2022 Recommended Consults -- Oral Care Recommendations Oral care QID Other Recommendations Order thickener from pharmacy;Have oral suction available;Clarify dietary restrictions Follow Up Recommendations Long-term institutional care without follow-up therapy Assistance recommended at discharge None Functional Status Assessment Patient has had a recent decline in their functional status and/or demonstrates limited ability to make significant improvements in function in a reasonable and predictable amount of time Frequency and Duration  01/08/2022 Speech Therapy Frequency (ACUTE ONLY) min 1 x/week Treatment Duration 1 week   Oral Phase 01/08/2022 Oral Phase Impaired Oral - Pudding Teaspoon -- Oral - Pudding Cup -- Oral - Honey Teaspoon -- Oral - Honey Cup -- Oral - Nectar Teaspoon WFL Oral - Nectar Cup WFL Oral - Nectar Straw WFL Oral - Thin  Teaspoon WFL Oral - Thin Cup WFL Oral - Thin Straw WFL Oral - Puree WFL Oral - Mech Soft WFL Oral - Regular -- Oral - Multi-Consistency -- Oral - Pill -- Oral Phase - Comment --  Pharyngeal Phase 01/08/2022 Pharyngeal Phase Impaired Pharyngeal- Pudding Teaspoon -- Pharyngeal -- Pharyngeal- Pudding Cup -- Pharyngeal -- Pharyngeal- Honey Teaspoon -- Pharyngeal --  Pharyngeal- Honey Cup -- Pharyngeal -- Pharyngeal- Nectar Teaspoon Pharyngeal residue - valleculae;Pharyngeal residue - pyriform;Reduced epiglottic inversion Pharyngeal Material does not enter airway Pharyngeal- Nectar Cup Pharyngeal residue - valleculae;Pharyngeal residue - pyriform;Reduced epiglottic inversion;Penetration/Aspiration during swallow;Penetration/Apiration after swallow;Reduced anterior laryngeal mobility;Reduced laryngeal elevation Pharyngeal Material enters airway, remains ABOVE vocal cords and not ejected out Pharyngeal- Nectar Straw Pharyngeal residue - valleculae;Pharyngeal residue - pyriform;Reduced epiglottic inversion Pharyngeal Material does not enter airway Pharyngeal- Thin Teaspoon Pharyngeal residue - valleculae;Pharyngeal residue - pyriform;Penetration/Aspiration during swallow Pharyngeal Material does not enter airway Pharyngeal- Thin Cup Pharyngeal residue - valleculae;Pharyngeal residue - pyriform;Reduced epiglottic inversion;Penetration/Aspiration during swallow Pharyngeal Material enters airway, passes BELOW cords and not ejected out despite cough attempt by patient Pharyngeal- Thin Straw Pharyngeal residue - valleculae;Pharyngeal residue - pyriform;Penetration/Aspiration during swallow;Reduced epiglottic inversion;Reduced airway/laryngeal closure;Reduced tongue base retraction Pharyngeal Material enters airway, passes BELOW cords and not ejected out despite cough attempt by patient Pharyngeal- Puree Pharyngeal residue - valleculae;Pharyngeal residue - pyriform;Reduced epiglottic inversion;Reduced pharyngeal peristalsis;Reduced  tongue base retraction Pharyngeal Material does not enter airway Pharyngeal- Mechanical Soft Pharyngeal residue - valleculae;Pharyngeal residue - pyriform;Reduced epiglottic inversion;Pharyngeal residue - posterior pharnyx;Reduced tongue base retraction Pharyngeal Material does not enter airway Pharyngeal- Regular NT Pharyngeal -- Pharyngeal- Multi-consistency NT Pharyngeal -- Pharyngeal- Pill NT Pharyngeal -- Pharyngeal Comment Pt tested with HOB reclined to 30* to determine if would maintain boluses in posterior pharynx decreasing aspiration - however pt was noted to begin gagging after cued to "hock" and reports gagging occurs frequently during meals and thus repositioned her to upright position.  Decreasing amount to tsps prevented aspiration of thin.  Nectar cup boluses were not aspirated but were retained in pharynx.  Pt swallowing of cracker resulted in severe pharyngeal retention - causing her to reflexively expectorate x2.  Cervical Esophageal Phase  01/08/2022 Cervical Esophageal Phase Impaired Pudding Teaspoon -- Pudding Cup -- Honey Teaspoon -- Honey Cup -- Nectar Teaspoon -- Nectar Cup -- Nectar Straw -- Thin Teaspoon -- Thin Cup -- Thin Straw -- Puree -- Mechanical Soft -- Regular -- Multi-consistency -- Pill -- Cervical Esophageal Comment -- Kathleen Lime, MS [facility] SLP Acute Rehab Services Office [phone] Cell [phone] Macario Golds 01/08/2022, 4:56 PM                       Labs:   Basic Metabolic Panel: Recent Labs  Lab 01/20/22 0812 01/20/22 1633 01/21/22 0336 01/21/22 1309 01/21/22 1707 01/22/22 0359 01/22/22 1710  NA 133*  --   --   --   --  134*  --   K 4.5  --   --    < > 4.0 4.4  --   CL 96*  --   --   --   --  98  --   CO2 32  --   --   --   --  31  --   GLUCOSE 120*  --   --   --   --  131*  --   BUN 17  --   --   --   --  20  --   CREATININE 0.36*  --   --   --   --  0.37*  --   CALCIUM 8.0*  --   --   --   --  8.2*  --   MG 1.4* 1.4* 1.6*  --  1.7 1.6* 1.5*   PHOS 3.8 3.6 4.4  --  3.4 3.7 3.0   < > =  values in this interval not displayed.   GFR Estimated Creatinine Clearance: 47.1 mL/min (A) (by C-G formula based on SCr of 0.37 mg/dL (L)). Liver Function Tests: No results for input(s): AST, ALT, ALKPHOS, BILITOT, PROT, ALBUMIN in the last 168 hours. No results for input(s): LIPASE, AMYLASE in the last 168 hours. No results for input(s): AMMONIA in the last 168 hours. Coagulation profile No results for input(s): INR, PROTIME in the last 168 hours.  CBC: No results for input(s): WBC, NEUTROABS, HGB, HCT, MCV, PLT in the last 168 hours. Cardiac Enzymes: No results for input(s): CKTOTAL, CKMB, CKMBINDEX, TROPONINI in the last 168 hours. BNP: Invalid input(s): POCBNP CBG: Recent Labs  Lab 01/22/22 1634 01/22/22 2034 01/22/22 2301 01/23/22 0410 01/23/22 0805  GLUCAP 122* 113* 144* 147* 137*   D-Dimer No results for input(s): DDIMER in the last 72 hours. Hgb A1c No results for input(s): HGBA1C in the last 72 hours. Lipid Profile No results for input(s): CHOL, HDL, LDLCALC, TRIG, CHOLHDL, LDLDIRECT in the last 72 hours. Thyroid function studies No results for input(s): TSH, T4TOTAL, T3FREE, THYROIDAB in the last 72 hours.  Invalid input(s): FREET3 Anemia work up No results for input(s): VITAMINB12, FOLATE, FERRITIN, TIBC, IRON, RETICCTPCT in the last 72 hours. Microbiology No results found for this or any previous visit (from the past 240 hour(s)).  Time coordinating discharge: 35 minutes  Signed: Zacary Bauer  [facility] 01/23/2022, 11:57 AM

## 2022-01-24 ENCOUNTER — Other Ambulatory Visit: Payer: Self-pay | Admitting: Adult Health

## 2022-01-24 DIAGNOSIS — Z792 Long term (current) use of antibiotics: Secondary | ICD-10-CM | POA: Diagnosis not present

## 2022-01-24 DIAGNOSIS — I5032 Chronic diastolic (congestive) heart failure: Secondary | ICD-10-CM | POA: Diagnosis not present

## 2022-01-24 DIAGNOSIS — K222 Esophageal obstruction: Secondary | ICD-10-CM | POA: Diagnosis not present

## 2022-01-24 DIAGNOSIS — M6281 Muscle weakness (generalized): Secondary | ICD-10-CM | POA: Diagnosis not present

## 2022-01-24 DIAGNOSIS — L89152 Pressure ulcer of sacral region, stage 2: Secondary | ICD-10-CM | POA: Diagnosis not present

## 2022-01-24 DIAGNOSIS — E039 Hypothyroidism, unspecified: Secondary | ICD-10-CM

## 2022-01-24 DIAGNOSIS — R627 Adult failure to thrive: Secondary | ICD-10-CM | POA: Diagnosis not present

## 2022-01-24 DIAGNOSIS — R1310 Dysphagia, unspecified: Secondary | ICD-10-CM | POA: Diagnosis not present

## 2022-01-24 DIAGNOSIS — I10 Essential (primary) hypertension: Secondary | ICD-10-CM | POA: Diagnosis not present

## 2022-01-24 DIAGNOSIS — E43 Unspecified severe protein-calorie malnutrition: Secondary | ICD-10-CM | POA: Diagnosis not present

## 2022-01-24 DIAGNOSIS — Z431 Encounter for attention to gastrostomy: Secondary | ICD-10-CM | POA: Diagnosis not present

## 2022-01-24 LAB — ZINC: Zinc: 43 ug/dL — ABNORMAL LOW (ref 44–115)

## 2022-01-24 LAB — COPPER, SERUM: Copper: 100 ug/dL (ref 80–158)

## 2022-01-24 LAB — VITAMIN B1: Vitamin B1 (Thiamine): 230.1 nmol/L — ABNORMAL HIGH (ref 66.5–200.0)

## 2022-01-25 LAB — VITAMIN C: Vitamin C: 1.3 mg/dL (ref 0.4–2.0)

## 2022-01-27 LAB — VITAMIN A: Vitamin A (Retinoic Acid): 21.7 ug/dL — ABNORMAL LOW (ref 22.0–69.5)

## 2022-01-28 ENCOUNTER — Other Ambulatory Visit: Payer: Self-pay | Admitting: Family Medicine

## 2022-01-28 ENCOUNTER — Other Ambulatory Visit: Payer: Self-pay

## 2022-01-28 ENCOUNTER — Telehealth: Payer: Self-pay | Admitting: Gastroenterology

## 2022-01-28 DIAGNOSIS — E43 Unspecified severe protein-calorie malnutrition: Secondary | ICD-10-CM | POA: Diagnosis not present

## 2022-01-28 DIAGNOSIS — T17908A Unspecified foreign body in respiratory tract, part unspecified causing other injury, initial encounter: Secondary | ICD-10-CM

## 2022-01-28 DIAGNOSIS — M6281 Muscle weakness (generalized): Secondary | ICD-10-CM | POA: Diagnosis not present

## 2022-01-28 DIAGNOSIS — I5032 Chronic diastolic (congestive) heart failure: Secondary | ICD-10-CM | POA: Diagnosis not present

## 2022-01-28 DIAGNOSIS — L89152 Pressure ulcer of sacral region, stage 2: Secondary | ICD-10-CM | POA: Diagnosis not present

## 2022-01-28 DIAGNOSIS — Z792 Long term (current) use of antibiotics: Secondary | ICD-10-CM | POA: Diagnosis not present

## 2022-01-28 DIAGNOSIS — K222 Esophageal obstruction: Secondary | ICD-10-CM | POA: Diagnosis not present

## 2022-01-28 DIAGNOSIS — I10 Essential (primary) hypertension: Secondary | ICD-10-CM | POA: Diagnosis not present

## 2022-01-28 DIAGNOSIS — R1310 Dysphagia, unspecified: Secondary | ICD-10-CM | POA: Diagnosis not present

## 2022-01-28 DIAGNOSIS — Z431 Encounter for attention to gastrostomy: Secondary | ICD-10-CM | POA: Diagnosis not present

## 2022-01-28 DIAGNOSIS — R627 Adult failure to thrive: Secondary | ICD-10-CM | POA: Diagnosis not present

## 2022-01-28 MED ORDER — PANTOPRAZOLE SODIUM 40 MG PO PACK: 40.0000 mg | PACK | Freq: Every day | ORAL | 3 refills | Status: DC

## 2022-01-28 NOTE — Progress Notes (Signed)
Entered in error

## 2022-01-28 NOTE — Telephone Encounter (Signed)
Spoke with pt's daughter [name] and let her know that the pantoprazole packets were sent in to pt's pharmacy. Pt's daughter verbalized understanding and had no other concerns at end of call.

## 2022-01-28 NOTE — Telephone Encounter (Signed)
Inbound call from [place] stated Patient is taking 40 mg of Protonix once daily and is having a hard time getting the pill down with the Peg Tube. Seeking advice if there is anyway she can be switch to a liquid medication to help with her acid reflux. Please advise.

## 2022-01-28 NOTE — Progress Notes (Signed)
Received MyChart message from pt's daughter stating they had not heard from Palliative care.  I don't see any orders so will place referral today.  In regards to dispensing her medication in individually wrapped packages, I will refer to [facility] to see if this is a possibility.

## 2022-01-29 ENCOUNTER — Other Ambulatory Visit: Payer: Self-pay | Admitting: Adult Health

## 2022-01-29 ENCOUNTER — Telehealth: Payer: Self-pay | Admitting: Gastroenterology

## 2022-01-29 DIAGNOSIS — E039 Hypothyroidism, unspecified: Secondary | ICD-10-CM

## 2022-01-29 NOTE — Telephone Encounter (Signed)
Inbound call from pt'/s nurse Abby requesting a call stating that the pt's protonix is expensive and want to see about an alternative and since she's been on a feeding tube the protonix isn't working for her. Abby can be reached at [phone]. Please advise. Thank you.

## 2022-01-29 NOTE — Telephone Encounter (Signed)
Can look to see if Nexium or Prilosec is covered better by her insurance.  Either would be a reasonable alternative to the Protonix for her reflux.

## 2022-01-30 MED ORDER — ESOMEPRAZOLE MAGNESIUM 40 MG PO CPDR: DELAYED_RELEASE_CAPSULE | ORAL | 1 refills | Status: DC

## 2022-01-30 NOTE — Telephone Encounter (Signed)
[facility]ium 40MG  [place] [facility]. Called Abby and LVM about an alternative and to contact us if she has any questions or concerns.

## 2022-01-31 ENCOUNTER — Telehealth: Payer: Self-pay

## 2022-01-31 DIAGNOSIS — R627 Adult failure to thrive: Secondary | ICD-10-CM | POA: Diagnosis not present

## 2022-01-31 DIAGNOSIS — L89152 Pressure ulcer of sacral region, stage 2: Secondary | ICD-10-CM | POA: Diagnosis not present

## 2022-01-31 DIAGNOSIS — M6281 Muscle weakness (generalized): Secondary | ICD-10-CM | POA: Diagnosis not present

## 2022-01-31 DIAGNOSIS — I5032 Chronic diastolic (congestive) heart failure: Secondary | ICD-10-CM | POA: Diagnosis not present

## 2022-01-31 DIAGNOSIS — R1310 Dysphagia, unspecified: Secondary | ICD-10-CM | POA: Diagnosis not present

## 2022-01-31 DIAGNOSIS — I10 Essential (primary) hypertension: Secondary | ICD-10-CM | POA: Diagnosis not present

## 2022-01-31 DIAGNOSIS — E43 Unspecified severe protein-calorie malnutrition: Secondary | ICD-10-CM | POA: Diagnosis not present

## 2022-01-31 DIAGNOSIS — Z792 Long term (current) use of antibiotics: Secondary | ICD-10-CM | POA: Diagnosis not present

## 2022-01-31 DIAGNOSIS — K222 Esophageal obstruction: Secondary | ICD-10-CM | POA: Diagnosis not present

## 2022-01-31 DIAGNOSIS — Z431 Encounter for attention to gastrostomy: Secondary | ICD-10-CM | POA: Diagnosis not present

## 2022-01-31 NOTE — Telephone Encounter (Signed)
Form completed and placed in basket.  Unfortunately I do not have the documentation that they are requiring regarding her wound.  Not sure if we can send D/C summary or not

## 2022-01-31 NOTE — Telephone Encounter (Signed)
Caller name:Velvet Lovena Le   On DPR? :Yes  Call back number:[phone]  Provider they see: Birdie Riddle   Reason for call:5 pages [facility] Detailed Written Order -Wound Care needs to be signed by Tabori placed in bin

## 2022-01-31 NOTE — Telephone Encounter (Signed)
Spoke with patient's husband Eduard Clos and scheduled a Mychart Palliative Consult for 02/05/22 @ 1:30 PM.   Consent obtained; updated Outlook/Netsmart/Team List and Epic.

## 2022-02-01 ENCOUNTER — Telehealth: Payer: Self-pay | Admitting: Family Medicine

## 2022-02-01 DIAGNOSIS — R1310 Dysphagia, unspecified: Secondary | ICD-10-CM | POA: Diagnosis not present

## 2022-02-01 DIAGNOSIS — Z792 Long term (current) use of antibiotics: Secondary | ICD-10-CM | POA: Diagnosis not present

## 2022-02-01 DIAGNOSIS — R627 Adult failure to thrive: Secondary | ICD-10-CM | POA: Diagnosis not present

## 2022-02-01 DIAGNOSIS — M6281 Muscle weakness (generalized): Secondary | ICD-10-CM | POA: Diagnosis not present

## 2022-02-01 DIAGNOSIS — I10 Essential (primary) hypertension: Secondary | ICD-10-CM | POA: Diagnosis not present

## 2022-02-01 DIAGNOSIS — E43 Unspecified severe protein-calorie malnutrition: Secondary | ICD-10-CM | POA: Diagnosis not present

## 2022-02-01 DIAGNOSIS — Z431 Encounter for attention to gastrostomy: Secondary | ICD-10-CM | POA: Diagnosis not present

## 2022-02-01 DIAGNOSIS — I5032 Chronic diastolic (congestive) heart failure: Secondary | ICD-10-CM | POA: Diagnosis not present

## 2022-02-01 DIAGNOSIS — K222 Esophageal obstruction: Secondary | ICD-10-CM | POA: Diagnosis not present

## 2022-02-01 DIAGNOSIS — L89152 Pressure ulcer of sacral region, stage 2: Secondary | ICD-10-CM | POA: Diagnosis not present

## 2022-02-01 NOTE — Telephone Encounter (Signed)
Form completed and placed in basket  

## 2022-02-01 NOTE — Telephone Encounter (Signed)
Will fax back and state that we don't have documentation requested

## 2022-02-01 NOTE — Telephone Encounter (Signed)
I have placed a home health add on discipline for pt from [facility] in the bin upfront with a charge sheet.

## 2022-02-01 NOTE — Telephone Encounter (Signed)
Faxed

## 2022-02-02 ENCOUNTER — Other Ambulatory Visit: Payer: Self-pay | Admitting: Adult Health

## 2022-02-02 DIAGNOSIS — E039 Hypothyroidism, unspecified: Secondary | ICD-10-CM

## 2022-02-04 ENCOUNTER — Ambulatory Visit: Payer: Medicare Other | Admitting: Podiatry

## 2022-02-04 DIAGNOSIS — Z792 Long term (current) use of antibiotics: Secondary | ICD-10-CM | POA: Diagnosis not present

## 2022-02-04 DIAGNOSIS — E43 Unspecified severe protein-calorie malnutrition: Secondary | ICD-10-CM | POA: Diagnosis not present

## 2022-02-04 DIAGNOSIS — L89152 Pressure ulcer of sacral region, stage 2: Secondary | ICD-10-CM | POA: Diagnosis not present

## 2022-02-04 DIAGNOSIS — M6281 Muscle weakness (generalized): Secondary | ICD-10-CM | POA: Diagnosis not present

## 2022-02-04 DIAGNOSIS — I10 Essential (primary) hypertension: Secondary | ICD-10-CM | POA: Diagnosis not present

## 2022-02-04 DIAGNOSIS — I5032 Chronic diastolic (congestive) heart failure: Secondary | ICD-10-CM | POA: Diagnosis not present

## 2022-02-04 DIAGNOSIS — R627 Adult failure to thrive: Secondary | ICD-10-CM | POA: Diagnosis not present

## 2022-02-04 DIAGNOSIS — Z431 Encounter for attention to gastrostomy: Secondary | ICD-10-CM | POA: Diagnosis not present

## 2022-02-04 DIAGNOSIS — R1310 Dysphagia, unspecified: Secondary | ICD-10-CM | POA: Diagnosis not present

## 2022-02-04 DIAGNOSIS — K222 Esophageal obstruction: Secondary | ICD-10-CM | POA: Diagnosis not present

## 2022-02-05 ENCOUNTER — Encounter: Payer: Self-pay | Admitting: Nurse Practitioner

## 2022-02-05 ENCOUNTER — Telehealth: Payer: Self-pay | Admitting: Family Medicine

## 2022-02-05 ENCOUNTER — Telehealth: Payer: Medicare Other | Admitting: Nurse Practitioner

## 2022-02-05 ENCOUNTER — Other Ambulatory Visit: Payer: Self-pay

## 2022-02-05 DIAGNOSIS — R5381 Other malaise: Secondary | ICD-10-CM

## 2022-02-05 DIAGNOSIS — Z515 Encounter for palliative care: Secondary | ICD-10-CM

## 2022-02-05 DIAGNOSIS — E039 Hypothyroidism, unspecified: Secondary | ICD-10-CM

## 2022-02-05 DIAGNOSIS — R1310 Dysphagia, unspecified: Secondary | ICD-10-CM

## 2022-02-05 MED ORDER — LEVOTHYROXINE SODIUM 125 MCG PO TABS: 125.0000 ug | ORAL_TABLET | Freq: Every day | ORAL | 0 refills | Status: DC

## 2022-02-05 NOTE — Progress Notes (Addendum)
[place] Consult Note Telephone: [phone]  Fax: [phone]   Date of encounter: 02/05/22 5:11 PM PATIENT NAME: Tasha Moss [place] [id number]   [phone] (home)  DOB: [birth year] MRN: [id number] PRIMARY CARE PROVIDER:    [name], MD,  [address] [place] [address] [phone]  REFERRING PROVIDER:   [name], MD [address] [place],  [place] [address] [phone]  RESPONSIBLE PARTY:    Contact Information     Name Relation Home Work Arnold Line Daughter   [phone]   Cox,Kimberly Daughter   [phone]   Remmi, Armenteros [phone]  [phone]       Due to the COVID-19 crisis, this visit was done via telemedicine from my office and it was initiated and consent by this patient and or family.  I connected with Mr and  Tasha Moss OR PROXY on 02/05/22 by a video enabled telemedicine application and verified that I am speaking with the correct person using two identifiers.   I discussed the limitations of evaluation and management by telemedicine. The patient expressed understanding and agreed to proceed.  Palliative Care was asked to follow this patient by consultation request of  Tabori, Aundra Millet, MD to address advance care planning and complex medical decision making. This is the initial visit.  ASSESSMENT AND PLAN / RECOMMENDATIONS:  Symptom Management/Plan: 1. Advance Care Planning;  full code; Ongoing discussions of code status, goc  2. Goals of Care: Goals include to maximize quality of life and symptom management. Our advance care planning conversation included a discussion about:    The value and importance of advance care planning  Exploration of personal, cultural or spiritual beliefs that might influence medical decisions  Exploration of goals of care in the event of a sudden injury or illness  Identification  and preparation of a healthcare agent  Review and updating or creation of an advance directive document.  3. Dysphagia, tolerating tube feedings, will continue current plan, monitor weights  4. Debility secondary to deconditioning, continue with in home pt; strengthening, balance, self independence, fall risk discussed.  5. Palliative care encounter; Palliative care encounter; Palliative medicine team will continue to support patient, patient's family, and medical team. Visit consisted of counseling and education dealing with the complex and emotionally intense issues of symptom management and palliative care in the setting of serious and potentially life-threatening illness Follow up Palliative Care Visit: Palliative care will continue to follow for complex medical decision making, advance care planning, and clarification of goals. Return 4 weeks or prn.  I spent 61 minutes providing this consultation. More than 50% of the time in this consultation was spent in counseling and care coordination.  PPS: 40%  Chief Complaint: Initial Palliative consult for complex medical decision making  HISTORY OF PRESENT ILLNESS:  Tasha Moss is a 86 y.o. year old female  with multiple medical problems including CHF, BPPV, hypothyoridism s/p partial thyroidectomy, esophageal stricture, severe dysphagia with g-tube essential to sustain life, FTT. Hospitalized 10/2021 for November 2022 with closed left hip dislocation s/p reduction and was discharged to SNF.  She caught COVID in the rehab, subsequently developed progressive vertigo with nausea, vomiting with progressive dysphagia with issues with passage of solid food. Tasha Moss had a barium swallow 12/27/2021 where she aspirated, became worse, weak, hospitalized 01/07/2022 to 01/23/2022 with peg placed 01/17/2022. Workup failure to thrive, moderate malnutrition,  sacral decubitus ulcer stage II, acute CHF with cardiomegaly with pulmonary vascular prominence suggestive  of CHF exacerbation. CT showed no acute abnormalities, recommended PT/OT with vestibular rehab, meclizine for vertigo. Avoid scopolamine patches due to dryness of mouth. She was d/c home with therapy, tube feedings. Telemedicine with Mr and Mrs Yearick. We talked about PMH, recent hospitalization, ros, symptoms, life review, family dynamics. We talked about tube feedings without residual. No other symptoms currently. We talked about working with therapy, slowly improving. We talked about medical goals including role pc in poc. Discussed f/u pc visit and will have PC RN f/u 2 to 3 weeks then NP in 6. Tasha Moss in agreement. Therapeutic listening, emotional support provided. Questions answered.   History obtained from review of EMR, discussion with Mr with Tasha Moss.  I reviewed available labs, medications, imaging, studies and related documents from the EMR.  Records reviewed and summarized above.   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 ([place]) 11/06/2021   Elevated BP without diagnosis of hypertension 10/31/2020   Dizziness 10/31/2020   Status post reverse arthroplasty of left shoulder 03/11/2016   Depression with anxiety 03/11/2016   Routine general medical examination at a health care facility 06/29/2014   H/O thyroid nodule 06/21/2013   DIVERTICULOSIS, COLON 07/18/2010   GERD without esophagitis 04/09/2010   Stricture and stenosis of esophagus  02/21/2010   Dysphagia 02/09/2010   Osteoarthritis 11/22/2009   Benign paroxysmal positional vertigo 05/15/2009   HTN (hypertension) 09/28/2008   SPINAL STENOSIS, LUMBAR 09/28/2008   PERNICIOUS ANEMIA 03/28/2008   Hypothyroidism 09/08/2007   HYPERLIPIDEMIA 09/08/2007   RESTLESS LEG SYNDROME 09/08/2007   HASHIMOTO'S THYROIDITIS 02/23/2007   PAST MEDICAL HISTORY:  Active Ambulatory Problems    Diagnosis Date Noted   Hypothyroidism 09/08/2007   HASHIMOTO'S THYROIDITIS 02/23/2007   HYPERLIPIDEMIA 09/08/2007   PERNICIOUS ANEMIA 03/28/2008   RESTLESS LEG SYNDROME 09/08/2007   Benign paroxysmal positional vertigo 05/15/2009   HTN (hypertension) 09/28/2008   Stricture and stenosis of esophagus 02/21/2010   GERD without esophagitis 04/09/2010   DIVERTICULOSIS, COLON 07/18/2010   Osteoarthritis 11/22/2009   SPINAL STENOSIS, LUMBAR 09/28/2008   Dysphagia 02/09/2010   H/O thyroid nodule 06/21/2013   Routine general medical examination at a health care facility 06/29/2014   Status post reverse arthroplasty of left shoulder 03/11/2016   Depression with anxiety 03/11/2016   Elevated BP without diagnosis of hypertension 10/31/2020   Dizziness 10/31/2020   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) 11/06/2021   SARS-associated coronavirus exposure 11/24/2021   Unspecified protein-calorie malnutrition ([place]) 11/24/2021   Non-seasonal allergic rhinitis 11/29/2021   Failure to thrive in adult 01/07/2022   Acute CHF (congestive heart failure) ([place]) 01/08/2022   Sacral decubitus ulcer, stage II ([place]) 01/08/2022   Malnutrition of moderate degree 01/09/2022   Feeding problems    Resolved Ambulatory Problems    Diagnosis Date Noted   ANEMIA NOS 10/05/2007   CONSTIPATION 07/18/2010   SKIN LESION 01/21/2008  DEGENERATIVE JOINT DISEASE, HIPS 02/23/2007   SHOULDER PAIN, LEFT 01/21/2008   MYALGIA 09/28/2008   MUSCULOSKELETAL PAIN 07/16/2010   CHEST PAIN 02/09/2010    Nausea alone 07/16/2010   Abdominal pain, left upper quadrant 07/18/2010   FLANK PAIN, LEFT 07/16/2010   URINALYSIS, ABNORMAL 07/16/2010   UNS ADVRS EFF UNS RX MEDICINAL&BIOLOGICAL SBSTNC 05/30/2008   ADVEF, DRUG/MED/BIOL SUBST, ARTHUS PHENOMENON 08/18/2007   TONSILLECTOMY AND ADENOIDECTOMY, HX OF 02/23/2007   Cerumen impaction 02/10/2015   Proximal humeral fracture 02/27/2016   Past Medical History:  Diagnosis Date   Allergy    Anxiety    Arthritis    Family history of adverse reaction to anesthesia    GERD (gastroesophageal reflux disease)    History of vertigo    Hyperlipidemia    Hypertension    PONV (postoperative nausea and vomiting)    Thyroid disease    SOCIAL HX:  Social History   Tobacco Use   Smoking status: Never   Smokeless tobacco: Never  Substance Use Topics   Alcohol use: No   FAMILY HX:  Family History  Problem Relation Age of Onset   Diabetes Mother    Hepatitis Mother        Hepatitis B,? etiology   Miscarriages / Stillbirths Mother    Stroke Mother 80   Heart disease Father        CHF   Cancer Paternal Aunt        GI cancers   Heart disease Paternal Aunt        CHF   Cancer Paternal Uncle        lung, GI   Heart disease Paternal Uncle        CHF      ALLERGIES:  Allergies  Allergen Reactions   Gabapentin     hallucinations   Other     Strong pain medications cause n/v   Morphine     Nausea & vomiting     PERTINENT MEDICATIONS:  Outpatient Encounter Medications as of 02/05/2022  Medication Sig   acetaminophen (TYLENOL) 500 MG tablet Take 500 mg by mouth every 6 (six) hours as needed for moderate pain.   Cholecalciferol (VITAMIN D3) 1000 UNITS CAPS Take 1,000 Units by mouth daily.   Cranberry 200 MG CAPS Take 1 capsule by mouth daily.   diclofenac Sodium (VOLTAREN) 1 % GEL Apply 2 g topically 4 (four) times daily as needed (Pain).   esomeprazole (NEXIUM) 40 MG capsule Take 40 MG place in feeding tube once daily   feeding  supplement (ENSURE ENLIVE / ENSURE PLUS) LIQD Take 237 mLs by mouth 3 (three) times daily between meals.   FLUoxetine (PROZAC) 20 MG capsule Take 1 capsule (20 mg total) by mouth daily.   fluticasone (FLONASE) 50 MCG/ACT nasal spray Place 1 spray into both nostrils daily.   loratadine (CLARITIN) 10 MG tablet Take 10 mg by mouth daily.   meloxicam (MOBIC) 7.5 MG tablet Take 1 tablet (7.5 mg total) by mouth 2 (two) times daily.   Multiple Vitamin (MULTIVITAMIN PO) Take 400 mg by mouth daily.   nutrition supplement, JUVEN, (JUVEN) PACK Place 1 packet into feeding tube 2 (two) times daily between meals.   Nutritional Supplements (FEEDING SUPPLEMENT, JEVITY 1.5 CAL/FIBER,) LIQD Place 50 mL/hr into feeding tube continuous. Dose to be titrated per RD at home.   Nutritional Supplements (FEEDING SUPPLEMENT, PROSOURCE TF,) liquid Place 45 mLs into feeding tube daily.   Nystatin (GERHARDT'S BUTT CREAM) CREA Apply 1 application  topically 4 (four) times daily.   ondansetron (ZOFRAN) 8 MG tablet Take 1 tablet (8 mg total) by mouth 3 (three) times daily before meals. (Patient taking differently: Take 8 mg by mouth every 8 (eight) hours as needed for vomiting or nausea.)   Polyvinyl Alcohol-Povidone (ARTIFICIAL TEARS) 5-6 MG/ML SOLN Place 2 drops into both eyes daily.   thiamine 100 MG tablet Take 1 tablet (100 mg total) by mouth daily.   UNABLE TO FIND TUBE FEEDS: 1/2 carton (118 ml) Jevity 1.5 QID with 60 ml free water before and 60 ml free water after each TF bolus and 45 ml Prosource TF (or equivalent) BID.    Maintain this regimen x4 days.    Then increase to 1 carton (237 ml) Jevity 1.5 QID with 60 ml free water before and 60 ml free water after each TF bolus and 45 ml Prosource TF (or equivalent) BID.    This goal regimen will provide 1500 kcal (86% kcal need), 82 grams protein (96% protein need), and 1200 ml free water.   vitamin B-12 (CYANOCOBALAMIN) 250 MCG tablet Take 250 mcg by mouth daily.   Water  For Irrigation, Sterile (FREE WATER) SOLN Place 100 mLs into feeding tube 6 (six) times daily.   [DISCONTINUED] levothyroxine (SYNTHROID) 125 MCG tablet Take 1 tablet (125 mcg total) by mouth daily before breakfast.   No facility-administered encounter medications on file as of 02/05/2022.   Thank you for the opportunity to participate in the care of Tasha Moss.  The palliative care team will continue to follow. Please call our office at [phone] if we can be of additional assistance.   Alanmichael Barmore Z Zyion Leidner, NP ,

## 2022-02-05 NOTE — Telephone Encounter (Signed)
Rx sent 

## 2022-02-05 NOTE — Telephone Encounter (Signed)
Pt's husband called in asking for a new script of the synthroid to be sent to [facility] in    Please advise

## 2022-02-06 DIAGNOSIS — K222 Esophageal obstruction: Secondary | ICD-10-CM | POA: Diagnosis not present

## 2022-02-06 DIAGNOSIS — M6281 Muscle weakness (generalized): Secondary | ICD-10-CM | POA: Diagnosis not present

## 2022-02-06 DIAGNOSIS — E43 Unspecified severe protein-calorie malnutrition: Secondary | ICD-10-CM | POA: Diagnosis not present

## 2022-02-06 DIAGNOSIS — Z431 Encounter for attention to gastrostomy: Secondary | ICD-10-CM | POA: Diagnosis not present

## 2022-02-06 DIAGNOSIS — I5032 Chronic diastolic (congestive) heart failure: Secondary | ICD-10-CM | POA: Diagnosis not present

## 2022-02-06 DIAGNOSIS — Z792 Long term (current) use of antibiotics: Secondary | ICD-10-CM | POA: Diagnosis not present

## 2022-02-06 DIAGNOSIS — R627 Adult failure to thrive: Secondary | ICD-10-CM | POA: Diagnosis not present

## 2022-02-06 DIAGNOSIS — L89152 Pressure ulcer of sacral region, stage 2: Secondary | ICD-10-CM | POA: Diagnosis not present

## 2022-02-06 DIAGNOSIS — R1310 Dysphagia, unspecified: Secondary | ICD-10-CM | POA: Diagnosis not present

## 2022-02-06 DIAGNOSIS — I10 Essential (primary) hypertension: Secondary | ICD-10-CM | POA: Diagnosis not present

## 2022-02-07 DIAGNOSIS — Z792 Long term (current) use of antibiotics: Secondary | ICD-10-CM | POA: Diagnosis not present

## 2022-02-07 DIAGNOSIS — L89152 Pressure ulcer of sacral region, stage 2: Secondary | ICD-10-CM | POA: Diagnosis not present

## 2022-02-07 DIAGNOSIS — R627 Adult failure to thrive: Secondary | ICD-10-CM | POA: Diagnosis not present

## 2022-02-07 DIAGNOSIS — I5032 Chronic diastolic (congestive) heart failure: Secondary | ICD-10-CM | POA: Diagnosis not present

## 2022-02-07 DIAGNOSIS — R1310 Dysphagia, unspecified: Secondary | ICD-10-CM | POA: Diagnosis not present

## 2022-02-07 DIAGNOSIS — K222 Esophageal obstruction: Secondary | ICD-10-CM | POA: Diagnosis not present

## 2022-02-07 DIAGNOSIS — Z431 Encounter for attention to gastrostomy: Secondary | ICD-10-CM | POA: Diagnosis not present

## 2022-02-07 DIAGNOSIS — I10 Essential (primary) hypertension: Secondary | ICD-10-CM | POA: Diagnosis not present

## 2022-02-07 DIAGNOSIS — M6281 Muscle weakness (generalized): Secondary | ICD-10-CM | POA: Diagnosis not present

## 2022-02-07 DIAGNOSIS — E43 Unspecified severe protein-calorie malnutrition: Secondary | ICD-10-CM | POA: Diagnosis not present

## 2022-02-11 DIAGNOSIS — I5032 Chronic diastolic (congestive) heart failure: Secondary | ICD-10-CM | POA: Diagnosis not present

## 2022-02-11 DIAGNOSIS — L89152 Pressure ulcer of sacral region, stage 2: Secondary | ICD-10-CM | POA: Diagnosis not present

## 2022-02-11 DIAGNOSIS — R1310 Dysphagia, unspecified: Secondary | ICD-10-CM | POA: Diagnosis not present

## 2022-02-11 DIAGNOSIS — K222 Esophageal obstruction: Secondary | ICD-10-CM | POA: Diagnosis not present

## 2022-02-11 DIAGNOSIS — R627 Adult failure to thrive: Secondary | ICD-10-CM | POA: Diagnosis not present

## 2022-02-11 DIAGNOSIS — I10 Essential (primary) hypertension: Secondary | ICD-10-CM | POA: Diagnosis not present

## 2022-02-11 DIAGNOSIS — Z431 Encounter for attention to gastrostomy: Secondary | ICD-10-CM | POA: Diagnosis not present

## 2022-02-11 DIAGNOSIS — M6281 Muscle weakness (generalized): Secondary | ICD-10-CM | POA: Diagnosis not present

## 2022-02-11 DIAGNOSIS — Z792 Long term (current) use of antibiotics: Secondary | ICD-10-CM | POA: Diagnosis not present

## 2022-02-11 DIAGNOSIS — E43 Unspecified severe protein-calorie malnutrition: Secondary | ICD-10-CM | POA: Diagnosis not present

## 2022-02-12 ENCOUNTER — Telehealth: Payer: Self-pay

## 2022-02-12 DIAGNOSIS — R1310 Dysphagia, unspecified: Secondary | ICD-10-CM | POA: Diagnosis not present

## 2022-02-12 DIAGNOSIS — M6281 Muscle weakness (generalized): Secondary | ICD-10-CM | POA: Diagnosis not present

## 2022-02-12 DIAGNOSIS — Z792 Long term (current) use of antibiotics: Secondary | ICD-10-CM | POA: Diagnosis not present

## 2022-02-12 DIAGNOSIS — L89152 Pressure ulcer of sacral region, stage 2: Secondary | ICD-10-CM | POA: Diagnosis not present

## 2022-02-12 DIAGNOSIS — I10 Essential (primary) hypertension: Secondary | ICD-10-CM | POA: Diagnosis not present

## 2022-02-12 DIAGNOSIS — R627 Adult failure to thrive: Secondary | ICD-10-CM | POA: Diagnosis not present

## 2022-02-12 DIAGNOSIS — E43 Unspecified severe protein-calorie malnutrition: Secondary | ICD-10-CM | POA: Diagnosis not present

## 2022-02-12 DIAGNOSIS — I5032 Chronic diastolic (congestive) heart failure: Secondary | ICD-10-CM | POA: Diagnosis not present

## 2022-02-12 DIAGNOSIS — K222 Esophageal obstruction: Secondary | ICD-10-CM | POA: Diagnosis not present

## 2022-02-12 DIAGNOSIS — Z431 Encounter for attention to gastrostomy: Secondary | ICD-10-CM | POA: Diagnosis not present

## 2022-02-12 NOTE — Telephone Encounter (Signed)
PC SW outreached patient to schedule f/u in home PC visit, per [facility] NP - C. Gusler request. ? ? ?Visit scheduled with patient for Wed 3/15 at 11 AM with PC RN/SW team.  ?

## 2022-02-13 ENCOUNTER — Telehealth: Payer: Self-pay | Admitting: Family Medicine

## 2022-02-13 DIAGNOSIS — K222 Esophageal obstruction: Secondary | ICD-10-CM | POA: Diagnosis not present

## 2022-02-13 DIAGNOSIS — R627 Adult failure to thrive: Secondary | ICD-10-CM | POA: Diagnosis not present

## 2022-02-13 DIAGNOSIS — I5032 Chronic diastolic (congestive) heart failure: Secondary | ICD-10-CM | POA: Diagnosis not present

## 2022-02-13 DIAGNOSIS — Z431 Encounter for attention to gastrostomy: Secondary | ICD-10-CM | POA: Diagnosis not present

## 2022-02-13 DIAGNOSIS — Z792 Long term (current) use of antibiotics: Secondary | ICD-10-CM | POA: Diagnosis not present

## 2022-02-13 DIAGNOSIS — I10 Essential (primary) hypertension: Secondary | ICD-10-CM | POA: Diagnosis not present

## 2022-02-13 DIAGNOSIS — R1310 Dysphagia, unspecified: Secondary | ICD-10-CM | POA: Diagnosis not present

## 2022-02-13 DIAGNOSIS — E43 Unspecified severe protein-calorie malnutrition: Secondary | ICD-10-CM | POA: Diagnosis not present

## 2022-02-13 DIAGNOSIS — M6281 Muscle weakness (generalized): Secondary | ICD-10-CM | POA: Diagnosis not present

## 2022-02-13 DIAGNOSIS — L89152 Pressure ulcer of sacral region, stage 2: Secondary | ICD-10-CM | POA: Diagnosis not present

## 2022-02-13 NOTE — Telephone Encounter (Signed)
I have placed plan of care forms from [facility] with a charge sheet, in Tabori's bin up front  ?

## 2022-02-14 ENCOUNTER — Telehealth: Payer: Self-pay

## 2022-02-14 DIAGNOSIS — Z792 Long term (current) use of antibiotics: Secondary | ICD-10-CM | POA: Diagnosis not present

## 2022-02-14 DIAGNOSIS — M6281 Muscle weakness (generalized): Secondary | ICD-10-CM | POA: Diagnosis not present

## 2022-02-14 DIAGNOSIS — K222 Esophageal obstruction: Secondary | ICD-10-CM | POA: Diagnosis not present

## 2022-02-14 DIAGNOSIS — I10 Essential (primary) hypertension: Secondary | ICD-10-CM | POA: Diagnosis not present

## 2022-02-14 DIAGNOSIS — R1310 Dysphagia, unspecified: Secondary | ICD-10-CM | POA: Diagnosis not present

## 2022-02-14 DIAGNOSIS — R627 Adult failure to thrive: Secondary | ICD-10-CM | POA: Diagnosis not present

## 2022-02-14 DIAGNOSIS — I5032 Chronic diastolic (congestive) heart failure: Secondary | ICD-10-CM | POA: Diagnosis not present

## 2022-02-14 DIAGNOSIS — L89152 Pressure ulcer of sacral region, stage 2: Secondary | ICD-10-CM | POA: Diagnosis not present

## 2022-02-14 DIAGNOSIS — Z431 Encounter for attention to gastrostomy: Secondary | ICD-10-CM | POA: Diagnosis not present

## 2022-02-14 DIAGNOSIS — E43 Unspecified severe protein-calorie malnutrition: Secondary | ICD-10-CM | POA: Diagnosis not present

## 2022-02-14 NOTE — Telephone Encounter (Signed)
Viewed form, confirmed patient. Placed in folder ?

## 2022-02-14 NOTE — Telephone Encounter (Signed)
Caller name:Kristinia Job  ? ?On DPR? :Yes ? ?Call back number:[phone] ? ?Provider they see: Birdie Riddle  ? ?Reason for [facility] senmt orders to be signed and returned placed on Dr Birdie Riddle bin  ? ?

## 2022-02-15 NOTE — Telephone Encounter (Signed)
Form completed and placed in basket  

## 2022-02-15 NOTE — Telephone Encounter (Signed)
faxed

## 2022-02-18 ENCOUNTER — Other Ambulatory Visit: Payer: Self-pay

## 2022-02-18 ENCOUNTER — Ambulatory Visit (INDEPENDENT_AMBULATORY_CARE_PROVIDER_SITE_OTHER)
Admission: RE | Admit: 2022-02-18 | Discharge: 2022-02-18 | Disposition: A | Discharge status: Home / Self Care | Payer: Medicare Other | Source: Ambulatory Visit | Attending: Nurse Practitioner | Admitting: Nurse Practitioner

## 2022-02-18 ENCOUNTER — Other Ambulatory Visit (INDEPENDENT_AMBULATORY_CARE_PROVIDER_SITE_OTHER): Payer: Medicare Other

## 2022-02-18 ENCOUNTER — Encounter: Payer: Self-pay | Admitting: Nurse Practitioner

## 2022-02-18 ENCOUNTER — Telehealth: Payer: Self-pay | Admitting: Family Medicine

## 2022-02-18 ENCOUNTER — Ambulatory Visit: Payer: Medicare Other | Admitting: Nurse Practitioner

## 2022-02-18 VITALS — BP 100/70 | HR 64 | Ht 65.0 in | Wt 124.0 lb

## 2022-02-18 DIAGNOSIS — R627 Adult failure to thrive: Secondary | ICD-10-CM

## 2022-02-18 DIAGNOSIS — Z96642 Presence of left artificial hip joint: Secondary | ICD-10-CM | POA: Diagnosis not present

## 2022-02-18 DIAGNOSIS — Z4682 Encounter for fitting and adjustment of non-vascular catheter: Secondary | ICD-10-CM | POA: Diagnosis not present

## 2022-02-18 DIAGNOSIS — K5900 Constipation, unspecified: Secondary | ICD-10-CM

## 2022-02-18 DIAGNOSIS — R918 Other nonspecific abnormal finding of lung field: Secondary | ICD-10-CM | POA: Diagnosis not present

## 2022-02-18 LAB — CBC WITH DIFFERENTIAL/PLATELET
Basophils Absolute: 0 10*3/uL (ref 0.0–0.1)
Basophils Relative: 0.4 % (ref 0.0–3.0)
Eosinophils Absolute: 0.5 10*3/uL (ref 0.0–0.7)
Eosinophils Relative: 6.5 % — ABNORMAL HIGH (ref 0.0–5.0)
HCT: 42.8 % (ref 36.0–46.0)
Hemoglobin: 14.1 g/dL (ref 12.0–15.0)
Lymphocytes Relative: 17 % (ref 12.0–46.0)
Lymphs Abs: 1.3 10*3/uL (ref 0.7–4.0)
MCHC: 33 g/dL (ref 30.0–36.0)
MCV: 93.1 fl (ref 78.0–100.0)
Monocytes Absolute: 0.8 10*3/uL (ref 0.1–1.0)
Monocytes Relative: 10 % (ref 3.0–12.0)
Neutro Abs: 5 10*3/uL (ref 1.4–7.7)
Neutrophils Relative %: 66.1 % (ref 43.0–77.0)
Platelets: 225 10*3/uL (ref 150.0–400.0)
RBC: 4.59 Mil/uL (ref 3.87–5.11)
RDW: 14.3 % (ref 11.5–15.5)
WBC: 7.6 10*3/uL (ref 4.0–10.5)

## 2022-02-18 LAB — COMPREHENSIVE METABOLIC PANEL
ALT: 17 U/L (ref 0–35)
AST: 19 U/L (ref 0–37)
Albumin: 3.6 g/dL (ref 3.5–5.2)
Alkaline Phosphatase: 75 U/L (ref 39–117)
BUN: 42 mg/dL — ABNORMAL HIGH (ref 6–23)
CO2: 34 mEq/L — ABNORMAL HIGH (ref 19–32)
Calcium: 9.5 mg/dL (ref 8.4–10.5)
Chloride: 98 mEq/L (ref 96–112)
Creatinine, Ser: 0.48 mg/dL (ref 0.40–1.20)
GFR: 87.18 mL/min (ref >60.00)
Glucose, Bld: 127 mg/dL — ABNORMAL HIGH (ref 70–99)
Potassium: 3.8 mEq/L (ref 3.5–5.1)
Sodium: 139 mEq/L (ref 135–145)
Total Bilirubin: 0.5 mg/dL (ref 0.2–1.2)
Total Protein: 6.8 g/dL (ref 6.0–8.3)

## 2022-02-18 NOTE — Progress Notes (Signed)
RADIOLOGY SCHEDULING REQUEST FOR Upper GI Series via peg tube sent to [facility] Scheduling via secure staff message. ?Reminder set to ensure appointment gets scheduled. ? ? ? ?

## 2022-02-18 NOTE — Patient Instructions (Signed)
Your provider has requested that you have an chest x-ray and abdominal x-ray before leaving today. Please go to the basement floor to our Radiology department for the test. ? ?Please proceed to the basement level for lab work before leaving today. Press "B" on the elevator. The lab is located at the first door on the left as you exit the elevator. ? ?HEALTHCARE LAWS AND MY CHART RESULTS:  ? ?Due to recent changes in healthcare laws, you may see results of your imaging and/or laboratory studies on MyChart before I have had a chance to review them.  I understand that in some cases there may be results that are confusing or concerning to you. Please understand that not all results are received at the same time and often I may need to interpret multiple results in order to provide you with the best plan of care or course of treatment. Therefore, I ask that you please give me 48 hours to thoroughly review all your results before contacting my office for clarification.  ? ?You will be contacted by [facility] (Your caller ID will indicate phone # [phone]) in the next 7 days to schedule your Upper GI series. If you have not heard from them within 7 business days, please call [facility] at [phone] to follow up on the status of your appointment.   ? ?RECOMMENDATIONS: ? ?Reduce tube feed to 40 cc an hour as tolerated. May increase back to 50 cc an hour if no further spitting up of tube feedings. ?Nexium 40 MG, open capsule and give via PEG tube twice a day. ? ?Thank you for trusting me with your gastrointestinal care!   ? ?Noralyn Pick, CRNP ? ? ? ?BMI: ? ?If you are age 84 or older, your body mass index should be between 23-30. Your Body mass index is 20.63 kg/m?Marland Kitchen If this is out of the aforementioned range listed, please consider follow up with your Primary Care Provider. ? ?If you are age 84 or younger, your body mass index should be between 19-25. Your Body  mass index is 20.63 kg/m?Marland Kitchen If this is out of the aformentioned range listed, please consider follow up with your Primary Care Provider.  ? ?MY CHART: ? ?The [facility] providers would like to encourage you to use [facility] to communicate with providers for non-urgent requests or questions.  Due to long hold times on the telephone, sending your provider a message by [facility] may be a faster and more efficient way to get a response.  Please allow 48 business hours for a response.  Please remember that this is for non-urgent requests.  ? ? ? ? ? ?

## 2022-02-18 NOTE — Progress Notes (Unsigned)
02/18/2022 Tasha Moss [id number] [birth year]   Chief Complaint: Spitting up tube feeding   History of Present Illness: Tasha Moss. Tasha Moss is an 86 year old female with a past medical history of anxiety, arthritis, hypertension, hyperlipidemia, CHF, hypothyroidism s/p partial thyroidectomy, vertigo, Covid 19 infection 11/2021, anemia, GERD and an esophageal stricture 2011.   I saw Tasha Haughey. Moss in office 12/18/2021 due to having persistent dysphagia with weight loss since her hospitalization with a left hip fracture s/p reduction and rehab, eventually discharged home 12/03/2021. At the time of her office visit, she continued to have dysphagia with solid foods with a decreased appetite and early dysphagia with a 50 lbs weight loss over the past year. An UGI series was done 12/27/2021 which showed aspiration of contrast into the trachea at the level of the mainstem bronchi therefore the study was terminated and the barium tablet was not administered. A  duodenal diverticula was noted. She was referred to speech pathology for a modified barium swallow study which was scheduled at [facility] 01/10/2022.  She was admitted  hospital 01/07/2022 due to having persistent dysphagia with associated generalized weakness and failure to thrive. A modified barium swallow study was done 01/08/2022 which showed severe pharyngocervical dysphagia with both sensorimotor and obstructive dysphagia resulting in gross retention of barium throughout pharynx and aspiration of thin liquids. A thick nectar liquid diet, follow solids with liquids and to remain seated 90 degrees after meals.   She underwent an EGD 01/11/2022 which showed mild mucosal wrent at the UES, a 2cm hiatal hernia without evidence of any gross esophageal lesion. Esophageal biopsies showed esophageal eosinophilia treated with PPI bid. No. H. Pylori. No contraindication for PEG placement. A PEG tube placement per IR was attempted on 01/15/2022 but was  not done to to superimposed colon, unable to identify a safe percutaneous window for the G- tube placement. She subsequently underwent PEG tube placement by general surgery on 01/17/2022 and she was started on tube feedings. Unfortunately, she developed several sacral decubitus ulcers during her hospitalization. She was discharged home on continuous feedings from 4pm to 10Am and nectar thick liquid diet.  She presents to our office today for further GI follow up. She is accompanied by her husband and daughter Tasha Moss who is a surgical RN at [employer]. Tube feedings were tolerated at 40cc/hr. She began "spitting up tube feedings" when the rate was increased to 50cc/hr.   Tube feeding is gets stuck, rolls up, swallows it  Bitter taste in mouth.  Nectar thickened liquids,  4 oz. Ice down.  Ice melts on tongue and goes down into throat.   BM today feels like she needs to go but does not have the strength to go  Sacral ulcers, home health wound care.   She has motion sickness and vomits when in the car    EGD 01/11/2022: - No gross lesions in esophagus. Biopsied. Dilated. Mild Imnpuacotiseanlt wrent at the UES noted. - Z-line regular, 39 cm from the incisors. - 2 cm hiatal hernia. Erythematous mucosa in the stomach - biopsied. - No gross lesions in the duodenal bulb, in the first portion of the duodenum and in the second portion of the duodenum. Biopsied.  A. STOMACH, BIOPSY:  - Antral and oxyntic mucosa with hyperemia.  - No Helicobacter pylori identified.   B. DUODENUM, BIOPSY:  - Duodenal mucosa with intact villous architecture.  - No villous atrophy or increased intraepithelial lymphocytes.   C. ESOPHAGUS, RANDOM, BIOPSY:  -  Squamous mucosa with focal, mildly increased eosinophils.  - Focally up to 17 per high-power field.        Current Medications, Allergies, Past Medical History, Past Surgical History, Family History and Social History were reviewed in [facility] record.   Review of Systems:   Constitutional: Negative for fever, sweats, chills or weight loss.  Respiratory: Negative for shortness of breath.   Cardiovascular: Negative for chest pain, palpitations and leg swelling.  Gastrointestinal: See HPI.  Musculoskeletal: Negative for back pain or muscle aches.  Neurological: Negative for dizziness, headaches or paresthesias.    Physical Exam: Ht '5\' 5"'$  (1.651 m)    Wt 124 lb (56.2 kg)    BMI 20.63 kg/m   Wt Readings from Last 3 Encounters:  02/18/22 124 lb (56.2 kg)  01/23/22 133 lb (60.3 kg)  12/18/21 133 lb (60.3 kg)    General: Well developed, w   ***female in no acute distress. Head: Normocephalic and atraumatic. Eyes: No scleral icterus. Conjunctiva pink . Ears: Normal auditory acuity. Mouth: Dentition intact. No ulcers or lesions.  Lungs: Clear throughout to auscultation. Heart: Regular rate and rhythm, no murmur. Abdomen: Soft, nontender and nondistended. No masses or hepatomegaly. Normal bowel sounds x 4 quadrants.  Rectal: *** Musculoskeletal: Symmetrical with no gross deformities. Extremities: No edema. Neurological: Alert oriented x 4. No focal deficits.  Psychological: Alert and cooperative. Normal mood and affect  Assessment and Recommendations:   66) 86 year old female with progressive dysphagia with failure to thrive. S/P PEG tube placement by general surgery

## 2022-02-18 NOTE — Telephone Encounter (Signed)
I have placed home health orders in the bin upfront from [facility]  ?

## 2022-02-19 ENCOUNTER — Other Ambulatory Visit: Payer: Self-pay

## 2022-02-19 ENCOUNTER — Other Ambulatory Visit: Payer: Self-pay | Admitting: Adult Health

## 2022-02-19 DIAGNOSIS — R1310 Dysphagia, unspecified: Secondary | ICD-10-CM | POA: Diagnosis not present

## 2022-02-19 DIAGNOSIS — I5032 Chronic diastolic (congestive) heart failure: Secondary | ICD-10-CM | POA: Diagnosis not present

## 2022-02-19 DIAGNOSIS — Z792 Long term (current) use of antibiotics: Secondary | ICD-10-CM | POA: Diagnosis not present

## 2022-02-19 DIAGNOSIS — M6281 Muscle weakness (generalized): Secondary | ICD-10-CM | POA: Diagnosis not present

## 2022-02-19 DIAGNOSIS — R627 Adult failure to thrive: Secondary | ICD-10-CM | POA: Diagnosis not present

## 2022-02-19 DIAGNOSIS — E43 Unspecified severe protein-calorie malnutrition: Secondary | ICD-10-CM | POA: Diagnosis not present

## 2022-02-19 DIAGNOSIS — Z431 Encounter for attention to gastrostomy: Secondary | ICD-10-CM | POA: Diagnosis not present

## 2022-02-19 DIAGNOSIS — I10 Essential (primary) hypertension: Secondary | ICD-10-CM | POA: Diagnosis not present

## 2022-02-19 DIAGNOSIS — L89152 Pressure ulcer of sacral region, stage 2: Secondary | ICD-10-CM | POA: Diagnosis not present

## 2022-02-19 DIAGNOSIS — K222 Esophageal obstruction: Secondary | ICD-10-CM | POA: Diagnosis not present

## 2022-02-19 MED ORDER — FLUOXETINE HCL 20 MG PO CAPS: 20.0000 mg | ORAL_CAPSULE | Freq: Every day | ORAL | 0 refills | Status: DC

## 2022-02-20 ENCOUNTER — Other Ambulatory Visit: Payer: Self-pay

## 2022-02-20 ENCOUNTER — Other Ambulatory Visit: Payer: Medicare Other

## 2022-02-20 ENCOUNTER — Other Ambulatory Visit: Payer: Self-pay | Admitting: Family Medicine

## 2022-02-20 VITALS — BP 120/76 | HR 101 | Temp 97.5°F

## 2022-02-20 DIAGNOSIS — Z515 Encounter for palliative care: Secondary | ICD-10-CM

## 2022-02-20 NOTE — Progress Notes (Signed)
COMMUNITY PALLIATIVE CARE SW NOTE ? ?PATIENT NAME: Tasha Moss ?DOB: [birth year] ?MRN: [id number] ? ?PRIMARY CARE PROVIDER: [name], MD ? ?RESPONSIBLE PARTY:  ?Acct ID - Guarantor Home Phone Work Phone Relationship Acct Type  ?[id number] Tasha Moss* [phone]  Self P/F  ?   [address], [place], [place] [address]  ? ? ? ?PLAN OF CARE and INTERVENTIONS:    ? ? GOALS OF CARE/ ADVANCE CARE PLANNING: Goals include to maximize quality of life and symptom management. Our advance care planning conversation included a discussion about:    ?The value and importance of advance care planning  ?Review and updating or creation of an advance directive document.  ?                        Patient is a FULL CODE. Patients husband has HCPOA with daughter Tasha Moss as back up.  ?  ?2.          Palliative care encounter: Palliative medicine team will continue to support patient, patient's family, and medical team. Visit consisted of counseling and education dealing with the complex and emotionally intense issues of symptom management and palliative care in the setting of serious and potentially life-threatening illness. ?SW and RN met with patient and husband in home visit, daughter Tasha Moss present for visit as well. Patient lives in one story home with spouse.  ? ?Functional changes/updates: Patient has not had any functional changes since his recent PC visit. Patient was sitting in living recliner visit. Patient denies recent falls. Fall and safety precautions discussed. Patient continues to use RW to assist with ambulation.  Patient continues to be SUP-MIN A with all ADL's. Patient receiving home health services [facility]), patient is need of SLP still at this point.  ? ?Home & Environment assessment: No concerns. Patient feels safe in her home environment. Patients home is one level. Patient is able to maneuver around her home without issue.  ? ?Protein calorie malnutrition/weight  loss/Appetite: Patient's appetite is poor. Patient is receiving nutrients through tube feeding pump feeding 56m 4pm-10am. Patient is sleeping well at night. ? ?Psychosocial assessment: completed. No additional physical needs identified at this time. PC discussed the possibility of changing pharmacies that offer pill packs and delivery for patient as spouse currently manages medications and sometimes mixes them up. SW discussed in home care support for patient with daughter. SW shared local area on gaining in home contact number and local DSS contact to inquire about care support not linked to Medicaid, as patient does not qualify for MCD. SW completed [employer] respite support program that can offer $500 grant to cover in home support, patient daughter listed as contact for application. Ongoing support/resources will continued to be offered if needed.  ? ?Military hx: N/A  ? ?Medical assessment: RN reviewed medications and took vitals.  ? ?Hospice discussion: N/A ? ?SW discussed goals, reviewed care plan, provided emotional support, used active and reflective listening in the form of reciprocity emotional response. Questions and concerns were addressed. The patient/family was encouraged to call with any additional questions and/or concerns. PC Provided general support and encouragement, no other unmet needs identified. Will continue to follow. ? ?3.         PATIENT/CAREGIVER EDUCATION/ COPING:   ?Appearance: well groomed, appropriate given situation  ?Mental Status: Alert/oriented. ?Eye Contact: Good. Able to engage in proper eye contact  ?Thought Process: rational  ?Thought Content: not assessed  ?Speech: Normal rate,  volume, tone  ?Mood: Normal and calm ?Affect: Congruent to endorsed mood, full ranging ?Insight: normal ?Judgement: normal  ?Interaction Style: Cooperative ?Patient A&O, patient engaged in fluent conversation and answered all questions appropriately. Patients mood was jovial during visit today. PHQ 9  is 0. Patient enjoys sitting outside and taking dog for walks. ? ?4.         PERSONAL EMERGENCY PLAN:  Patient will call 9-1-1 for emergencies.   ? ?5.         COMMUNITY RESOURCES COORDINATION/ HEALTH CARE NAVIGATION:  Patient and children manages his care. ? ?6.         FINANCIAL CONCERNS/NEEDS: None. ?                       Primary Health Insurance: [facility] Medicare ?Secondary Health Insurance: N/A ?Prescription Coverage: Yes, no history of difficulty obtaining or affording prescriptions reported ? ?   ? ?SOCIAL HX:  ?Social History  ? ?Tobacco Use  ? Smoking status: Never  ? Smokeless tobacco: Never  ?Substance Use Topics  ? Alcohol use: No  ? ? ?CODE STATUS: FULL CODE  ?ADVANCED DIRECTIVES: Y ?MOST FORM COMPLETE: N ?HOSPICE EDUCATION PROVIDED: N ? ?PPS: Patient is SUP-MIN A with all ADL's at this time. Patient is A&O with good insight and judgement. ? ? ?Time spent: 1 hr ? ? ? ? ? ?Doreene Eland, LCSW ? ?

## 2022-02-20 NOTE — Telephone Encounter (Signed)
Faxed

## 2022-02-20 NOTE — Telephone Encounter (Signed)
I called the patient's daughter [name], see chest x-ray and abdominal x-ray notes for details. ?

## 2022-02-20 NOTE — Progress Notes (Signed)
Can we please call in a prescription for Fanny Cream to [facility] [phone]) as follows ? ?Fanny Cream ?Apply to affected area TID PRN ?Disp 1 tube, 6 refills ?

## 2022-02-20 NOTE — Progress Notes (Signed)
Called and was able to give verbals for this no issue! ?

## 2022-02-20 NOTE — Progress Notes (Signed)
PATIENT NAME: Tasha Moss ?DOB: Aug 01, 1936 ?MRN: [id number] ? ?PRIMARY CARE PROVIDER: [name], MD ? ?RESPONSIBLE PARTY:  ?Acct ID - Guarantor Home Phone Work Phone Relationship Acct Type  ?[id number] Tasha Moss, Tasha Moss* [phone]  Self P/F  ?   [address], [place], [place] [address]  ? ? ?PLAN OF CARE and INTERVENTIONS: ?              1.  GOALS OF CARE/ ADVANCE CARE PLANNING:  Ongoing discussion regarding ACP.  Daughter [name] and spouse present for visit.  Daughter realistic about patient's condition.  Goal is to keep both patient and spouse in the home and provide as much assistance as possible.  ?              2.  PATIENT/CAREGIVER EDUCATION:  Resources ?              4. PERSONAL EMERGENCY PLAN:  Activate 911 for emergencies.  ?              5.  DISEASE STATUS: ? ?Dysphagia:  Now doing tube feeding.  Only able to tolerate 40 cc/hr of Jevity 1.5 diluted with water.  Patient experiences regurgitation and reflux with anything higher than 40 cc/hr.  See weights below. ? ?02/18/22 124 lb (56.2 kg)  ?01/23/22 133 lb (60.3 kg)  ?12/18/21 133 lb (60.3 kg)  ? ?Home Health:  Patient is currently being seen by [facility] for nursing and physical therapy.  Occupational therapy has been completed and Speech therapy is still pending. Patient has an upcoming evaluation with Physical Therapy to assess continuation of services.  ? ?Mobility:  Patient requires assistance with ADL's.  Currently using a rolling walker.  Spouse is supportive and assists patient with tasks.  She is unable to get out of the shower without assistance and also requires occasional assistance to stand from her chair.  Patient is using assistive devices to complete her showers which include grab bars, shower chair and body brush to wash her lower extremities.  No falls reported.  ? ?Skin Integrity:  Currently healing Stage 2 sacral wound.   Using meperplix and home health is managing wound.  ? ?Shortness of breath:  Patient is  visibly short of breath while talking.  Daughter shares a recent dx of CHF. ? ?Resources:  Discussed bubble packs for medications.  [facility] is able to package and deliver medications to patient's home.  Patient was advised to obtain "fanny cream" from [facility] to aide in skin breakdown.  Daughter is attempting to obtain ingredients per PCP request.  Phone call made to pharmacy to obtain ingredients and how order needs to read.  Information provided to [facility] who sent a message through MyChart to PCP.  SW provided additional resources. ? ? ? ? ?HISTORY OF PRESENT ILLNESS:  Tasha Moss is a 86 y.o. year old female  with multiple medical problems including CHF, BPPV, hypothyoridism s/p partial thyroidectomy, esophageal stricture, severe dysphagia with g-tube essential to sustain life, FTT.  Patient is being followed by Palliative Care every 4-8 weeks and PRN.  ? ?CODE STATUS: Full ?ADVANCED DIRECTIVES: Yes ?MOST FORM: No ?PPS: 40% ? ? ?PHYSICAL EXAM:  ? ?VITALS: ?Today's Vitals  ? 02/20/22 1144  ?BP: 120/76  ?Pulse: (!) 101  ?Temp: (!) 97.5 ?F (36.4 ?C)  ?SpO2: 93%  ?  ?LUNGS: clear to auscultation  ?CARDIAC: Cor Tachy}  ?EXTREMITIES: - for edema ?SKIN: Healing Stage 2 sacral wound ?NEURO: positive  for gait problems and weakness ? ? ? ? ? ? ?Lorenza Burton, RN ? ?

## 2022-02-20 NOTE — Telephone Encounter (Signed)
Form completed and placed in basket  

## 2022-02-21 ENCOUNTER — Telehealth: Payer: Self-pay

## 2022-02-21 DIAGNOSIS — L89152 Pressure ulcer of sacral region, stage 2: Secondary | ICD-10-CM | POA: Diagnosis not present

## 2022-02-21 DIAGNOSIS — M6281 Muscle weakness (generalized): Secondary | ICD-10-CM | POA: Diagnosis not present

## 2022-02-21 DIAGNOSIS — Z431 Encounter for attention to gastrostomy: Secondary | ICD-10-CM | POA: Diagnosis not present

## 2022-02-21 DIAGNOSIS — R1310 Dysphagia, unspecified: Secondary | ICD-10-CM | POA: Diagnosis not present

## 2022-02-21 DIAGNOSIS — E43 Unspecified severe protein-calorie malnutrition: Secondary | ICD-10-CM | POA: Diagnosis not present

## 2022-02-21 DIAGNOSIS — K222 Esophageal obstruction: Secondary | ICD-10-CM | POA: Diagnosis not present

## 2022-02-21 DIAGNOSIS — I10 Essential (primary) hypertension: Secondary | ICD-10-CM | POA: Diagnosis not present

## 2022-02-21 DIAGNOSIS — R627 Adult failure to thrive: Secondary | ICD-10-CM | POA: Diagnosis not present

## 2022-02-21 DIAGNOSIS — I5032 Chronic diastolic (congestive) heart failure: Secondary | ICD-10-CM | POA: Diagnosis not present

## 2022-02-21 DIAGNOSIS — Z792 Long term (current) use of antibiotics: Secondary | ICD-10-CM | POA: Diagnosis not present

## 2022-02-21 NOTE — Telephone Encounter (Signed)
-----   Message from April H Pait sent at 02/19/2022  3:21 PM EDT ----- ?Regarding: FW: Upper GI series via peg tube ? ?----- Message ----- ?From: Maryann Conners, NT ?Sent: 02/19/2022   3:18 PM EDT ?To: April H Pait ?Subject: RE: Upper GI series via peg tube              ? ?FYI.... ? ?02/19/22- pt stated that she's waiting on other test results before having this DG UGI sched, and will contact our office if needed @ later date.. ? ? ? ?LC ? ?----- Message ----- ?From: Pait, April H ?Sent: 02/18/2022   5:10 PM EDT ?To: Maryann Conners, NT ?Subject: FW: Upper GI series via peg tube              ? ? ?----- Message ----- ?From: Cardell Peach I, CMA ?Sent: 02/18/2022   4:03 PM EDT ?To: April H Pait, Roosvelt Maser ?Subject: Upper GI series via peg tube                  ? ?RADIOLOGY SCHEDULING REQUEST ? ?[facility] ?Phone: [phone] ?Fax: [phone]  ? ?Imaging Ordered: Upper GI series via PEG tube. ? ?Diagnosis: verify tube placement and rule out gastric outlet obstruction. ? ?Ordering Provider: Noralyn Pick, CRNP ? ?Is a Prior Authorization needed? We are in the process of obtaining it now ? ?Is the patient Diabetic? No ? ?Does the patient have Hypertension? Yes ? ?Does the patient have any implanted devices or hardware? No ? ?Date of last BUN/Creat, if needed? 02/18/22 ? ?Patient Weight? 124lb ? ?Is the patient able to get on the table? Yes with some assistance. ? ?Has the patient been diagnosed with COVID? No ? ?Is the patient waiting on COVID testing results? No ? ?Thank you for your assistance! ?[facility] ? ? ? ? ? ? ?

## 2022-02-21 NOTE — Telephone Encounter (Signed)
----- Message from April H Pait sent at 02/20/2022  2:20 PM EDT ----- ?Regarding: FW: Upper GI series via peg tube ? ?----- Message ----- ?From: Maryann Conners, NT ?Sent: 02/20/2022   1:50 PM EDT ?To: April H Pait ?Subject: RE: Upper GI series via peg tube              ? ?FYI ? ?02/20/22- S/w pts daughter [name], she stated that they were not ready to have this appt sched. at this time.. ? ? ?Lc ? ? ? ?----- Message ----- ?From: Pait, April H ?Sent: 02/20/2022  12:19 PM EDT ?To: Maryann Conners, NT ?Subject: FW: Upper GI series via peg tube              ? ? ?----- Message ----- ?From: Noralyn Pick, NP ?Sent: 02/20/2022   9:07 AM EDT ?To: April H Pait, Eleanora Neighbor, CMA ?Subject: RE: Upper GI series via peg tube              ? ?Thank you for the update.  I just received the patient's chest x-ray and abdominal x-ray which did not show any abnormality to explain her symptoms.  April, please contact the patient's daughter [name] who is an Therapist, sports to schedule the upper GI series.  Please let me know when the upper GI series via PEG tube has been scheduled.  Thank you so much. ?Colleen ?----- Message ----- ?From: Cardell Peach I, CMA ?Sent: 02/19/2022   3:33 PM EDT ?To: Noralyn Pick, NP ?Subject: FW: Upper GI series via peg tube              ? ?FYI ?----- Message ----- ?From: Pait, April H ?Sent: 02/19/2022   3:22 PM EDT ?To: Eleanora Neighbor, CMA ?Subject: FW: Upper GI series via peg tube              ? ? ?----- Message ----- ?From: Maryann Conners, NT ?Sent: 02/19/2022   3:18 PM EDT ?To: April H Pait ?Subject: RE: Upper GI series via peg tube              ? ?FYI.... ? ?02/19/22- pt stated that she's waiting on other test results before having this DG UGI sched, and will contact our office if needed @ later date.. ? ? ? ?LC ? ?----- Message ----- ?From: Pait, April H ?Sent: 02/18/2022   5:10 PM EDT ?To: Maryann Conners, NT ?Subject: FW: Upper GI series via peg tube              ? ? ?----- Message ----- ?From:  Cardell Peach I, CMA ?Sent: 02/18/2022   4:03 PM EDT ?To: April H Pait, Roosvelt Maser ?Subject: Upper GI series via peg tube                  ? ?RADIOLOGY SCHEDULING REQUEST ? ?[facility] ?Phone: [phone] ?Fax: [phone]  ? ?Imaging Ordered: Upper GI series via PEG tube. ? ?Diagnosis: verify tube placement and rule out gastric outlet obstruction. ? ?Ordering Provider: Noralyn Pick, CRNP ? ?Is a Prior Authorization needed? We are in the process of obtaining it now ? ?Is the patient Diabetic? No ? ?Does the patient have Hypertension? Yes ? ?Does the patient have any implanted devices or hardware? No ? ?Date of last BUN/Creat, if needed? 02/18/22 ? ?Patient Weight? 124lb ? ?Is the patient able to get on the table? Yes with some assistance. ? ?Has the patient  been diagnosed with COVID? No ? ?Is the patient waiting on COVID testing results? No ? ?Thank you for your assistance! ?[facility] ? ? ? ? ? ? ? ? ? ? ? ?

## 2022-02-21 NOTE — Telephone Encounter (Signed)
Colleen notified.

## 2022-02-21 NOTE — Telephone Encounter (Signed)
----- Message from April H Pait sent at 02/20/2022  2:20 PM EDT ----- ?Regarding: FW: Upper GI series via peg tube ? ?----- Message ----- ?From: Maryann Conners, NT ?Sent: 02/20/2022   1:50 PM EDT ?To: April H Pait ?Subject: RE: Upper GI series via peg tube              ? ?FYI ? ?02/20/22- S/w pts daughter [name], she stated that they were not ready to have this appt sched. at this time.. ? ? ?Lc ? ? ? ?----- Message ----- ?From: Pait, April H ?Sent: 02/20/2022  12:19 PM EDT ?To: Maryann Conners, NT ?Subject: FW: Upper GI series via peg tube              ? ? ?----- Message ----- ?From: Noralyn Pick, NP ?Sent: 02/20/2022   9:07 AM EDT ?To: April H Pait, Eleanora Neighbor, CMA ?Subject: RE: Upper GI series via peg tube              ? ?Thank you for the update.  I just received the patient's chest x-ray and abdominal x-ray which did not show any abnormality to explain her symptoms.  April, please contact the patient's daughter [name] who is an Therapist, sports to schedule the upper GI series.  Please let me know when the upper GI series via PEG tube has been scheduled.  Thank you so much. ?Colleen ?----- Message ----- ?From: Cardell Peach I, CMA ?Sent: 02/19/2022   3:33 PM EDT ?To: Noralyn Pick, NP ?Subject: FW: Upper GI series via peg tube              ? ?FYI ?----- Message ----- ?From: Pait, April H ?Sent: 02/19/2022   3:22 PM EDT ?To: Eleanora Neighbor, CMA ?Subject: FW: Upper GI series via peg tube              ? ? ?----- Message ----- ?From: Maryann Conners, NT ?Sent: 02/19/2022   3:18 PM EDT ?To: April H Pait ?Subject: RE: Upper GI series via peg tube              ? ?FYI.... ? ?02/19/22- pt stated that she's waiting on other test results before having this DG UGI sched, and will contact our office if needed @ later date.. ? ? ? ?LC ? ?----- Message ----- ?From: Pait, April H ?Sent: 02/18/2022   5:10 PM EDT ?To: Maryann Conners, NT ?Subject: FW: Upper GI series via peg tube              ? ? ?----- Message ----- ?From:  Cardell Peach I, CMA ?Sent: 02/18/2022   4:03 PM EDT ?To: April H Pait, Roosvelt Maser ?Subject: Upper GI series via peg tube                  ? ?RADIOLOGY SCHEDULING REQUEST ? ?[facility] ?Phone: [phone] ?Fax: [phone]  ? ?Imaging Ordered: Upper GI series via PEG tube. ? ?Diagnosis: verify tube placement and rule out gastric outlet obstruction. ? ?Ordering Provider: Noralyn Pick, CRNP ? ?Is a Prior Authorization needed? We are in the process of obtaining it now ? ?Is the patient Diabetic? No ? ?Does the patient have Hypertension? Yes ? ?Does the patient have any implanted devices or hardware? No ? ?Date of last BUN/Creat, if needed? 02/18/22 ? ?Patient Weight? 124lb ? ?Is the patient able to get on the table? Yes with some assistance. ? ?Has the patient  been diagnosed with COVID? No ? ?Is the patient waiting on COVID testing results? No ? ?Thank you for your assistance! ?[facility] ? ? ? ? ? ? ? ? ? ? ? ?

## 2022-02-21 NOTE — Telephone Encounter (Signed)
Tasha Moss has been made aware. ?

## 2022-02-22 DIAGNOSIS — L89152 Pressure ulcer of sacral region, stage 2: Secondary | ICD-10-CM | POA: Diagnosis not present

## 2022-02-26 DIAGNOSIS — Z431 Encounter for attention to gastrostomy: Secondary | ICD-10-CM | POA: Diagnosis not present

## 2022-02-26 DIAGNOSIS — L89152 Pressure ulcer of sacral region, stage 2: Secondary | ICD-10-CM | POA: Diagnosis not present

## 2022-02-26 DIAGNOSIS — E43 Unspecified severe protein-calorie malnutrition: Secondary | ICD-10-CM | POA: Diagnosis not present

## 2022-02-26 DIAGNOSIS — Z792 Long term (current) use of antibiotics: Secondary | ICD-10-CM | POA: Diagnosis not present

## 2022-02-26 DIAGNOSIS — K222 Esophageal obstruction: Secondary | ICD-10-CM | POA: Diagnosis not present

## 2022-02-26 DIAGNOSIS — I5032 Chronic diastolic (congestive) heart failure: Secondary | ICD-10-CM | POA: Diagnosis not present

## 2022-02-26 DIAGNOSIS — M6281 Muscle weakness (generalized): Secondary | ICD-10-CM | POA: Diagnosis not present

## 2022-02-26 DIAGNOSIS — R1310 Dysphagia, unspecified: Secondary | ICD-10-CM | POA: Diagnosis not present

## 2022-02-26 DIAGNOSIS — R627 Adult failure to thrive: Secondary | ICD-10-CM | POA: Diagnosis not present

## 2022-02-26 DIAGNOSIS — I10 Essential (primary) hypertension: Secondary | ICD-10-CM | POA: Diagnosis not present

## 2022-02-27 ENCOUNTER — Ambulatory Visit: Payer: Medicare Other | Admitting: Gastroenterology

## 2022-02-28 ENCOUNTER — Other Ambulatory Visit: Payer: Self-pay | Admitting: Family Medicine

## 2022-02-28 ENCOUNTER — Telehealth: Payer: Self-pay

## 2022-02-28 DIAGNOSIS — K222 Esophageal obstruction: Secondary | ICD-10-CM | POA: Diagnosis not present

## 2022-02-28 DIAGNOSIS — Z431 Encounter for attention to gastrostomy: Secondary | ICD-10-CM | POA: Diagnosis not present

## 2022-02-28 DIAGNOSIS — R1310 Dysphagia, unspecified: Secondary | ICD-10-CM | POA: Diagnosis not present

## 2022-02-28 DIAGNOSIS — E039 Hypothyroidism, unspecified: Secondary | ICD-10-CM

## 2022-02-28 DIAGNOSIS — I10 Essential (primary) hypertension: Secondary | ICD-10-CM | POA: Diagnosis not present

## 2022-02-28 DIAGNOSIS — M6281 Muscle weakness (generalized): Secondary | ICD-10-CM | POA: Diagnosis not present

## 2022-02-28 DIAGNOSIS — L89152 Pressure ulcer of sacral region, stage 2: Secondary | ICD-10-CM | POA: Diagnosis not present

## 2022-02-28 DIAGNOSIS — I5032 Chronic diastolic (congestive) heart failure: Secondary | ICD-10-CM | POA: Diagnosis not present

## 2022-02-28 DIAGNOSIS — Z792 Long term (current) use of antibiotics: Secondary | ICD-10-CM | POA: Diagnosis not present

## 2022-02-28 DIAGNOSIS — R627 Adult failure to thrive: Secondary | ICD-10-CM | POA: Diagnosis not present

## 2022-02-28 DIAGNOSIS — E43 Unspecified severe protein-calorie malnutrition: Secondary | ICD-10-CM | POA: Diagnosis not present

## 2022-02-28 MED ORDER — LEVOTHYROXINE SODIUM 125 MCG PO TABS: 125.0000 ug | ORAL_TABLET | Freq: Every day | ORAL | 1 refills | Status: DC

## 2022-02-28 MED ORDER — ESOMEPRAZOLE MAGNESIUM 40 MG PO CPDR: DELAYED_RELEASE_CAPSULE | ORAL | 1 refills | Status: DC

## 2022-02-28 MED ORDER — FLUOXETINE HCL 20 MG PO CAPS: 20.0000 mg | ORAL_CAPSULE | Freq: Every day | ORAL | 1 refills | Status: DC

## 2022-02-28 NOTE — Telephone Encounter (Signed)
Form viewed, placed in folder to sign ?

## 2022-02-28 NOTE — Progress Notes (Signed)
Sent at the request of pt's daughter ?

## 2022-02-28 NOTE — Telephone Encounter (Signed)
Home Health Orders placed in bin to be signed  ?

## 2022-02-28 NOTE — Progress Notes (Signed)
PC SW received notification that patient was awarded a $500 voucher from [employer]. ? ?SW outreached patients daughter, [name], to make aware of notification to check patients mailbox this week for further information from the program.  ?

## 2022-03-01 NOTE — Telephone Encounter (Signed)
Paper work has been faxed

## 2022-03-01 NOTE — Telephone Encounter (Signed)
Form completed and placed in basket  

## 2022-03-04 ENCOUNTER — Other Ambulatory Visit: Payer: Self-pay | Admitting: Family Medicine

## 2022-03-04 MED ORDER — MELOXICAM 7.5 MG PO TABS: 7.5000 mg | ORAL_TABLET | Freq: Two times a day (BID) | ORAL | 1 refills | Status: DC

## 2022-03-07 ENCOUNTER — Encounter: Payer: Self-pay | Admitting: Family Medicine

## 2022-03-07 DIAGNOSIS — I10 Essential (primary) hypertension: Secondary | ICD-10-CM | POA: Diagnosis not present

## 2022-03-07 DIAGNOSIS — R1310 Dysphagia, unspecified: Secondary | ICD-10-CM | POA: Diagnosis not present

## 2022-03-07 DIAGNOSIS — M6281 Muscle weakness (generalized): Secondary | ICD-10-CM | POA: Diagnosis not present

## 2022-03-07 DIAGNOSIS — K222 Esophageal obstruction: Secondary | ICD-10-CM | POA: Diagnosis not present

## 2022-03-07 DIAGNOSIS — E43 Unspecified severe protein-calorie malnutrition: Secondary | ICD-10-CM | POA: Diagnosis not present

## 2022-03-07 DIAGNOSIS — Z431 Encounter for attention to gastrostomy: Secondary | ICD-10-CM | POA: Diagnosis not present

## 2022-03-07 DIAGNOSIS — Z792 Long term (current) use of antibiotics: Secondary | ICD-10-CM | POA: Diagnosis not present

## 2022-03-07 DIAGNOSIS — L89152 Pressure ulcer of sacral region, stage 2: Secondary | ICD-10-CM | POA: Diagnosis not present

## 2022-03-07 DIAGNOSIS — I5032 Chronic diastolic (congestive) heart failure: Secondary | ICD-10-CM | POA: Diagnosis not present

## 2022-03-07 DIAGNOSIS — R627 Adult failure to thrive: Secondary | ICD-10-CM | POA: Diagnosis not present

## 2022-03-08 ENCOUNTER — Other Ambulatory Visit: Payer: Self-pay

## 2022-03-08 DIAGNOSIS — M6281 Muscle weakness (generalized): Secondary | ICD-10-CM | POA: Diagnosis not present

## 2022-03-08 DIAGNOSIS — L89152 Pressure ulcer of sacral region, stage 2: Secondary | ICD-10-CM | POA: Diagnosis not present

## 2022-03-08 DIAGNOSIS — E43 Unspecified severe protein-calorie malnutrition: Secondary | ICD-10-CM | POA: Diagnosis not present

## 2022-03-08 DIAGNOSIS — Z792 Long term (current) use of antibiotics: Secondary | ICD-10-CM | POA: Diagnosis not present

## 2022-03-08 DIAGNOSIS — I10 Essential (primary) hypertension: Secondary | ICD-10-CM | POA: Diagnosis not present

## 2022-03-08 DIAGNOSIS — R627 Adult failure to thrive: Secondary | ICD-10-CM | POA: Diagnosis not present

## 2022-03-08 DIAGNOSIS — K222 Esophageal obstruction: Secondary | ICD-10-CM | POA: Diagnosis not present

## 2022-03-08 DIAGNOSIS — R1310 Dysphagia, unspecified: Secondary | ICD-10-CM | POA: Diagnosis not present

## 2022-03-08 DIAGNOSIS — Z431 Encounter for attention to gastrostomy: Secondary | ICD-10-CM | POA: Diagnosis not present

## 2022-03-08 DIAGNOSIS — I5032 Chronic diastolic (congestive) heart failure: Secondary | ICD-10-CM | POA: Diagnosis not present

## 2022-03-08 MED ORDER — ESOMEPRAZOLE MAGNESIUM 40 MG PO CPDR
DELAYED_RELEASE_CAPSULE | ORAL | 1 refills | Status: DC
Start: 2022-03-08 — End: 2022-03-11

## 2022-03-11 ENCOUNTER — Other Ambulatory Visit: Payer: Self-pay

## 2022-03-11 MED ORDER — ESOMEPRAZOLE MAGNESIUM 40 MG PO CPDR: DELAYED_RELEASE_CAPSULE | ORAL | 1 refills | Status: DC

## 2022-03-11 NOTE — Telephone Encounter (Signed)
Patient's medication was sent to wrong pharmacy. Raidyn would like for all prescriptions to be sent in to [facility]  ?

## 2022-03-12 ENCOUNTER — Other Ambulatory Visit: Payer: Medicare Other | Admitting: Nurse Practitioner

## 2022-03-12 ENCOUNTER — Encounter: Payer: Self-pay | Admitting: Nurse Practitioner

## 2022-03-12 DIAGNOSIS — Z431 Encounter for attention to gastrostomy: Secondary | ICD-10-CM | POA: Diagnosis not present

## 2022-03-12 DIAGNOSIS — I5032 Chronic diastolic (congestive) heart failure: Secondary | ICD-10-CM | POA: Diagnosis not present

## 2022-03-12 DIAGNOSIS — Z792 Long term (current) use of antibiotics: Secondary | ICD-10-CM | POA: Diagnosis not present

## 2022-03-12 DIAGNOSIS — R5381 Other malaise: Secondary | ICD-10-CM | POA: Diagnosis not present

## 2022-03-12 DIAGNOSIS — M6281 Muscle weakness (generalized): Secondary | ICD-10-CM | POA: Diagnosis not present

## 2022-03-12 DIAGNOSIS — R1310 Dysphagia, unspecified: Secondary | ICD-10-CM

## 2022-03-12 DIAGNOSIS — K222 Esophageal obstruction: Secondary | ICD-10-CM | POA: Diagnosis not present

## 2022-03-12 DIAGNOSIS — I10 Essential (primary) hypertension: Secondary | ICD-10-CM | POA: Diagnosis not present

## 2022-03-12 DIAGNOSIS — L89152 Pressure ulcer of sacral region, stage 2: Secondary | ICD-10-CM | POA: Diagnosis not present

## 2022-03-12 DIAGNOSIS — Z515 Encounter for palliative care: Secondary | ICD-10-CM | POA: Diagnosis not present

## 2022-03-12 DIAGNOSIS — R627 Adult failure to thrive: Secondary | ICD-10-CM | POA: Diagnosis not present

## 2022-03-12 DIAGNOSIS — E43 Unspecified severe protein-calorie malnutrition: Secondary | ICD-10-CM | POA: Diagnosis not present

## 2022-03-12 NOTE — Progress Notes (Signed)
? ? ?Manufacturing engineer ?Community Palliative Care Consult Note ?Telephone: [phone]  ?Fax: [phone]  ? ? ?Date of encounter: 03/12/22 ?3:47 PM ?PATIENT NAME: Tasha Moss ?[place] [address]   ?[phone] (home) [phone] (work) ?DOB: 1936/11/05 ?MRN: [id number] ?PRIMARY CARE PROVIDER:    ?[name], MD,  ?[phone] [address] ?[place] [address] ?[phone] ? ?RESPONSIBLE PARTY:    ?Contact Information   ? ? Name Relation Home Work Mobile  ? Tasha Moss, Tasha Moss Daughter   [phone]  ? Tasha Moss,Tasha Moss Daughter   [phone]  ? Tasha Moss,Tasha Moss Spouse [phone]  [phone]  ? ?  ? ?I met face to face with patient and family in home. Palliative Care was asked to follow this patient by consultation request of  Tabori, Aundra Millet, MD to address advance care planning and complex medical decision making. This is a follow up visit.                                  ?ASSESSMENT AND PLAN / RECOMMENDATIONS:  ?Symptom Management/Plan: ?1. Advance Care Planning;  full code; Ongoing discussions of code status, goc ?  ?2. Dysphagia, tolerating tube feedings with some intermit nausea but she will continue to try at 50cc as she discussed needs the calories, will continue current plan, monitor weights ?  ?3. Debility secondary to deconditioning, continue with in home pt; strengthening, balance, self independence, fall risk discussed. ?  ?4. Palliative care encounter; Palliative care encounter; Palliative medicine team will continue to support patient, patient's family, and medical team. Visit consisted of counseling and education dealing with the complex and emotionally intense issues of symptom management and palliative care in the setting of serious and potentially life-threatening illness ? ?Follow up Palliative Care Visit: Palliative care will continue to follow for complex medical decision making, advance care planning, and clarification of goals. Return 4 weeks or  prn. ? ?I spent 61 minutes providing this consultation. More than 50% of the time in this consultation was spent in counseling and care coordination. ?PPS: 40% ? ?Chief Complaint: Follow up palliative consult for complex medical decision making ? ?HISTORY OF PRESENT ILLNESS:  Tasha Moss is a 86 y.o. year old female  with multiple medical problems including CHF, BPPV, hypothyoridism s/p partial thyroidectomy, esophageal stricture, severe dysphagia with g-tube essential to sustain life, FTT. Hospitalized 10/2021 for November 2022 with closed left hip dislocation s/p reduction and was discharged to SNF.  She caught COVID in the rehab, subsequently developed progressive vertigo with nausea, vomiting with progressive dysphagia with issues with passage of solid food. Tasha Moss had a barium swallow 12/27/2021 where she aspirated, became worse, weak, hospitalized 01/07/2022 to 01/23/2022 with peg placed 01/17/2022. Workup failure to thrive, moderate malnutrition, sacral decubitus ulcer stage II, acute CHF with cardiomegaly with pulmonary vascular prominence suggestive of CHF exacerbation. She was d/c home with therapy, tube feedings. Tasha Moss resides with her husband with assistance of 2 daughters. I called Tasha Moss, Tasha Moss's daughter to confirm Tasha Moss visit, also contacted Tasha Moss to confirm visit. Both in agreement. I visited Tasha Moss in their home. Both sitting in recliners in the living room. We talked about purpose of pc visit. We talked about symptoms including feeling some intermit nausea with increase in tube feedings from 40 to 50cc, weights, dysphagia, medications. We talked about recent GI workups. Tasha Moss talked about her experience. We talked  about walking with walker, functional overall decline. We talked about the difficulty with mobility. We talked about option of using an in home primary provider such as [facility]. Tasha and Tasha Moss both in agreement to go ahead and proceed with setting  that up. Tasha Moss endorses it is very tiring to go to appointments. We talked about her daily routine. We talked about quality of life. We talked about medical goals. We talked about plan of care. We talked about edema bilateral feet, toes. We talked about elevating her legs. We talked about medications being bubble packed at the pharmacy and it is almost completed, needed to wait until some prescriptions ran out. We talked about wounds buttocks improving with fanny creme which Tasha Moss is able to get at the pharmacy. We talked about role pc in poc. We talked about f/u pc visit in 4 weeks with PC RN then 8 weeks with Tasha Moss. Tasha and Tasha Moss in agreement, therapeutic listening, emotional support provided. Questions answered.  ? ?History obtained from review of EMR, discussion with Tasha and Tasha Moss.  ?I reviewed available labs, medications, imaging, studies and related documents from the EMR.  Records reviewed and summarized above.  ? ?ROS ?10 point system reviewed all negative except HPI ? ?Physical Exam: ?Constitutional: NAD ?General: frail appearing, thin, pleasant female ?EYES: lids intact ?ENMT:  oral mucous membranes moist ?CV: S1S2, RRR, mild edema bilateral feet ?Pulmonary: LCTA, no increased work of breathing, no cough ?Abdomen: soft and non tender, g-tube ?MSK: ambulatory with walker ?Skin: warm and dry ?Neuro:  + generalized weakness,  no cognitive impairment ?Psych: non-anxious affect, A and O x 3 ?Thank you for the opportunity to participate in the care of Tasha Moss.  The palliative care team will continue to follow. Please call our office at [phone] if we can be of additional assistance.  ? ?Yostin Malacara Z Chandria Rookstool, Moss  ? ?COVID-19 PATIENT SCREENING TOOL ?Asked and negative response unless otherwise noted:  ? ?Have you had symptoms of covid, tested positive or been in contact with someone with symptoms/positive test in the past 5-10 days? no  ?

## 2022-03-13 ENCOUNTER — Telehealth: Payer: Self-pay

## 2022-03-13 NOTE — Telephone Encounter (Signed)
PC SW outreached patients daughter, Joelene Millin, per [facility] NP - C. Gusler request to f/u on patients approval/reward of the [employer] respite program. ? ? ?Call unsuccessful. SW LVM with contact information.  ?

## 2022-03-14 ENCOUNTER — Telehealth: Payer: Self-pay

## 2022-03-14 NOTE — Telephone Encounter (Signed)
Home Health Verbal Orders ? ?Agency:  [facility]  ? ?Requesting OT/ PT/ Skilled nursing/ Social Work/ Speech:  Speech ? ?Reason for Request:  EVALUATION ? ? ? ?HH needs F2F w/in last 30 days   ?

## 2022-03-14 NOTE — Progress Notes (Signed)
PC SW outreached patients daughter, Tasha Moss, returning her TC. ? ?Daughter shared that she had information on the [facility] Respite program and did not have any further questions about this. However, her concern/question was in regard to patients feedings and equipment. Daughter shared that currently [facility] assist with managing the feedings and that they call [employer] [facility] for supplies. Daughter states she was told by [facility] that this would now be managed by palliative care. ? ?SW advised daughter family/patient would continue to call [facility] for feeding tube supplies and jevity. Orders, if needed, would more than likely be supplied by Gertie Fey MD as palliative care does not oversee this. Daughter stated understanding and appreciative of clarity. ? ?Daughter had no other questions for SW at this time and advised PC to outreach patient directly to schedule in home visit. ?

## 2022-03-14 NOTE — Telephone Encounter (Signed)
LVM for Tasha Moss regarding verbal orders. Ask that she call back with any further needs ?

## 2022-03-15 DIAGNOSIS — L89152 Pressure ulcer of sacral region, stage 2: Secondary | ICD-10-CM | POA: Diagnosis not present

## 2022-03-15 DIAGNOSIS — R1310 Dysphagia, unspecified: Secondary | ICD-10-CM | POA: Diagnosis not present

## 2022-03-15 DIAGNOSIS — Z431 Encounter for attention to gastrostomy: Secondary | ICD-10-CM | POA: Diagnosis not present

## 2022-03-15 DIAGNOSIS — M6281 Muscle weakness (generalized): Secondary | ICD-10-CM | POA: Diagnosis not present

## 2022-03-15 DIAGNOSIS — I5032 Chronic diastolic (congestive) heart failure: Secondary | ICD-10-CM | POA: Diagnosis not present

## 2022-03-15 DIAGNOSIS — E43 Unspecified severe protein-calorie malnutrition: Secondary | ICD-10-CM | POA: Diagnosis not present

## 2022-03-15 DIAGNOSIS — R627 Adult failure to thrive: Secondary | ICD-10-CM | POA: Diagnosis not present

## 2022-03-15 DIAGNOSIS — K222 Esophageal obstruction: Secondary | ICD-10-CM | POA: Diagnosis not present

## 2022-03-15 DIAGNOSIS — Z792 Long term (current) use of antibiotics: Secondary | ICD-10-CM | POA: Diagnosis not present

## 2022-03-15 DIAGNOSIS — I10 Essential (primary) hypertension: Secondary | ICD-10-CM | POA: Diagnosis not present

## 2022-03-19 DIAGNOSIS — R627 Adult failure to thrive: Secondary | ICD-10-CM | POA: Diagnosis not present

## 2022-03-19 DIAGNOSIS — L89152 Pressure ulcer of sacral region, stage 2: Secondary | ICD-10-CM | POA: Diagnosis not present

## 2022-03-19 DIAGNOSIS — I5032 Chronic diastolic (congestive) heart failure: Secondary | ICD-10-CM | POA: Diagnosis not present

## 2022-03-19 DIAGNOSIS — Z431 Encounter for attention to gastrostomy: Secondary | ICD-10-CM | POA: Diagnosis not present

## 2022-03-19 DIAGNOSIS — E43 Unspecified severe protein-calorie malnutrition: Secondary | ICD-10-CM | POA: Diagnosis not present

## 2022-03-19 DIAGNOSIS — M6281 Muscle weakness (generalized): Secondary | ICD-10-CM | POA: Diagnosis not present

## 2022-03-19 DIAGNOSIS — I10 Essential (primary) hypertension: Secondary | ICD-10-CM | POA: Diagnosis not present

## 2022-03-19 DIAGNOSIS — K222 Esophageal obstruction: Secondary | ICD-10-CM | POA: Diagnosis not present

## 2022-03-19 DIAGNOSIS — Z792 Long term (current) use of antibiotics: Secondary | ICD-10-CM | POA: Diagnosis not present

## 2022-03-19 DIAGNOSIS — R1310 Dysphagia, unspecified: Secondary | ICD-10-CM | POA: Diagnosis not present

## 2022-03-20 DIAGNOSIS — R1310 Dysphagia, unspecified: Secondary | ICD-10-CM | POA: Diagnosis not present

## 2022-03-20 DIAGNOSIS — Z792 Long term (current) use of antibiotics: Secondary | ICD-10-CM | POA: Diagnosis not present

## 2022-03-20 DIAGNOSIS — M6281 Muscle weakness (generalized): Secondary | ICD-10-CM | POA: Diagnosis not present

## 2022-03-20 DIAGNOSIS — L89152 Pressure ulcer of sacral region, stage 2: Secondary | ICD-10-CM | POA: Diagnosis not present

## 2022-03-20 DIAGNOSIS — Z431 Encounter for attention to gastrostomy: Secondary | ICD-10-CM | POA: Diagnosis not present

## 2022-03-20 DIAGNOSIS — I5032 Chronic diastolic (congestive) heart failure: Secondary | ICD-10-CM | POA: Diagnosis not present

## 2022-03-20 DIAGNOSIS — K222 Esophageal obstruction: Secondary | ICD-10-CM | POA: Diagnosis not present

## 2022-03-20 DIAGNOSIS — R627 Adult failure to thrive: Secondary | ICD-10-CM | POA: Diagnosis not present

## 2022-03-20 DIAGNOSIS — I10 Essential (primary) hypertension: Secondary | ICD-10-CM | POA: Diagnosis not present

## 2022-03-20 DIAGNOSIS — E43 Unspecified severe protein-calorie malnutrition: Secondary | ICD-10-CM | POA: Diagnosis not present

## 2022-03-25 ENCOUNTER — Telehealth: Payer: Self-pay

## 2022-03-25 NOTE — Telephone Encounter (Signed)
Home Health Certification or Plan of Care Tracking ? ?Is this a Certification or Plan of Care? both ? ?[place] Agency: [facility] ? ?Order Number:  [id number] ? ?Has charge sheet been attached? yes ? ?Where has form been placed:   folder up front  ?

## 2022-03-25 NOTE — Telephone Encounter (Signed)
Paperwork is upfront in [place]  ?

## 2022-03-28 DIAGNOSIS — R627 Adult failure to thrive: Secondary | ICD-10-CM | POA: Diagnosis not present

## 2022-03-28 DIAGNOSIS — K222 Esophageal obstruction: Secondary | ICD-10-CM | POA: Diagnosis not present

## 2022-03-28 DIAGNOSIS — E43 Unspecified severe protein-calorie malnutrition: Secondary | ICD-10-CM | POA: Diagnosis not present

## 2022-03-28 DIAGNOSIS — Z431 Encounter for attention to gastrostomy: Secondary | ICD-10-CM

## 2022-03-28 DIAGNOSIS — R1310 Dysphagia, unspecified: Secondary | ICD-10-CM

## 2022-03-28 DIAGNOSIS — M6281 Muscle weakness (generalized): Secondary | ICD-10-CM

## 2022-03-28 DIAGNOSIS — I5032 Chronic diastolic (congestive) heart failure: Secondary | ICD-10-CM | POA: Diagnosis not present

## 2022-03-28 DIAGNOSIS — I10 Essential (primary) hypertension: Secondary | ICD-10-CM

## 2022-04-02 ENCOUNTER — Telehealth: Payer: Self-pay

## 2022-04-02 NOTE — Telephone Encounter (Signed)
PC SW outreached patient and spouse to schedule f/u PC visit. ? ?Visit scheduled for 5/3 '@11am'$  with PC RN/SW team. ?

## 2022-04-05 NOTE — Telephone Encounter (Signed)
Form completed and placed in basket  

## 2022-04-08 NOTE — Telephone Encounter (Signed)
This was faxed

## 2022-04-08 NOTE — Telephone Encounter (Signed)
Did not see this in the to be faxed folder, did you already process this?  ?

## 2022-04-10 ENCOUNTER — Telehealth: Payer: Self-pay | Admitting: Pharmacist

## 2022-04-10 ENCOUNTER — Other Ambulatory Visit: Payer: Medicare Other

## 2022-04-10 VITALS — BP 114/66 | HR 86 | Temp 97.6°F | Wt 122.5 lb

## 2022-04-10 DIAGNOSIS — R627 Adult failure to thrive: Secondary | ICD-10-CM | POA: Diagnosis not present

## 2022-04-10 DIAGNOSIS — Z515 Encounter for palliative care: Secondary | ICD-10-CM

## 2022-04-10 DIAGNOSIS — I5032 Chronic diastolic (congestive) heart failure: Secondary | ICD-10-CM | POA: Diagnosis not present

## 2022-04-10 DIAGNOSIS — I10 Essential (primary) hypertension: Secondary | ICD-10-CM | POA: Diagnosis not present

## 2022-04-10 DIAGNOSIS — M6281 Muscle weakness (generalized): Secondary | ICD-10-CM | POA: Diagnosis not present

## 2022-04-10 DIAGNOSIS — R1310 Dysphagia, unspecified: Secondary | ICD-10-CM | POA: Diagnosis not present

## 2022-04-10 DIAGNOSIS — Z431 Encounter for attention to gastrostomy: Secondary | ICD-10-CM | POA: Diagnosis not present

## 2022-04-10 DIAGNOSIS — E43 Unspecified severe protein-calorie malnutrition: Secondary | ICD-10-CM | POA: Diagnosis not present

## 2022-04-10 DIAGNOSIS — K222 Esophageal obstruction: Secondary | ICD-10-CM | POA: Diagnosis not present

## 2022-04-10 NOTE — Progress Notes (Signed)
PATIENT NAME: Tasha Moss ?DOB: 01-31-1936 ?MRN: [id number] ? ?PRIMARY CARE PROVIDER: [name], MD ? ?RESPONSIBLE PARTY:  ?Acct ID - Guarantor Home Phone Work Phone Relationship Acct Type  ?[id number] CHRISTEL, BAI* [phone]  Self P/F  ?   [address], [place], [place] [address]  ? ? ?PLAN OF CARE and INTERVENTIONS: ?              1.  GOALS OF CARE/ ADVANCE CARE PLANNING:  Remain home and independent with the assistance of her spouse and daughters.  ?              2.  PATIENT/CAREGIVER EDUCATION:  aspiration, safety, energy conservation.  ?              4. PERSONAL EMERGENCY PLAN:  Activate 911 for emergencies.  ?              5.  DISEASE STATUS: ? ?Dysphagia:  Patient is NPO other than ice chips and thickened juice for pleasure. Discussed risk for aspiration.  Patient verbalizes understanding.  [place] RN is checking respiratory status weekly per patient.   Feedings are now up to 55 cc/hr with a goal of 70 cc/hr.   Patient is tolerating feedings fairly well.  Occasional reflux but not as severe as it was about 2 months ago.   Spouse is maintaining feeding tubes.  Patient advised her weight is maintaining between 122.5-124 lbs. ? ?02/18/22 124 lb (56.2 kg)  ?01/23/22 133 lb (60.3 kg)  ?12/18/21 133 lb (60.3 kg)  ? ?Functional Status:  Patient is ambulatory with a rolling walker.  She has a lift chair to assist her with standing.  Patient requires 1 person assistance with lower body dressing due to her previous hip replacement.  Patient is able to manage upper body with out difficulty.  Patient is assisting with household chores such as dusting.  She is now doing meal preparations with spouse handling any lifting.  We discussed energy conservation and resting as needed.  Patient feels she has gained some of her strength back since our last visit.  No falls since our last visit in March.  ? ?Medication Management:  Now receiving bubble packs from [place]. Spouse is managing medication  administration through the peg tube.  ? ?Safety:  Discussed life alert as this is not in place.  Maybe offered through [facility] insurance.  Education provided to both patient/spouse regarding purpose.   ? ?Skin Integrity:  Patient endorses healing of sacral wound.  Area is about 2 x 2 with redness and excoriation. Currently without dressings.  Continues with fanny cream routinely.  [place] RN will see patient today and reorder 4x4 dressings. ? ? ?HISTORY OF PRESENT ILLNESS:   Tasha Moss is a 86 y.o. year old female  with multiple medical problems including CHF, BPPV, hypothyoridism s/p partial thyroidectomy, esophageal stricture, severe dysphagia with g-tube essential to sustain life, FTT. Hospitalized 10/2021 for November 2022 with closed left hip dislocation s/p reduction and was discharged to SNF.  She caught COVID in the rehab, subsequently developed progressive vertigo with nausea, vomiting with progressive dysphagia with issues with passage of solid food. Ms. Tun had a barium swallow 12/27/2021 where she aspirated, became worse, weak, hospitalized 01/07/2022 to 01/23/2022 with peg placed 01/17/2022. Workup failure to thrive, moderate malnutrition, sacral decubitus ulcer stage II, acute CHF with cardiomegaly with pulmonary vascular prominence suggestive of CHF exacerbation. She was d/c home with therapy, tube feedings.  Patient is  being followed by Palliative Care every 4-8 weeks and PRN. ? ?CODE STATUS: Full ?ADVANCED DIRECTIVES: Yes ?MOST FORM: No ?PPS: 40% ? ? ?PHYSICAL EXAM:  ? ?LUNGS: clear to auscultation  ?CARDIAC: Cor RRR}  ?EXTREMITIES: trace bilateral pedal edema ?SKIN: Skin color, texture, turgor normal. No rashes or lesions or Healing Stage 2 sacral wound/left buttock wound.   ?NEURO: positive for gait problems ? ? ? ? ? ? ?Lorenza Burton, RN ? ?

## 2022-04-10 NOTE — Progress Notes (Signed)
COMMUNITY PALLIATIVE CARE SW NOTE ? ?PATIENT NAME: Tasha Moss ?DOB: 04/30/1936 ?MRN: [id number] ? ?PRIMARY CARE PROVIDER: [name], MD ? ?RESPONSIBLE PARTY:  ?Acct ID - Guarantor Home Phone Work Phone Relationship Acct Type  ?[id number] YENTY, BLOCH* [phone]  Self P/F  ?   Tasha Moss, [place],  [address]  ? ? ? ?PLAN OF CARE and INTERVENTIONS:             ? ? GOALS OF CARE/ ADVANCE CARE PLANNING:            Goals include to maximize quality of life and symptom management. Our advance care planning conversation included a discussion about:    ?The value and importance of advance care planning  ?Review and updating or creation of an advance directive document.  ?                       Patient is a FULL CODE. Patients husband has HCPOA with daughter Tasha Moss as back up.  ?  ?2.     Palliative care encounter: Palliative medicine team will continue to support patient, patient's family, and medical team. Visit consisted of counseling and education dealing with the complex and emotionally intense issues of symptom management and palliative care in the setting of serious and potentially life-threatening illness. ? ?SW and RN met with patient and husband in home visit. Patient lives in one story home with spouse.  ?  ?Functional changes/updates: Patient has not had any functional changes since his recent PC visit. Patient was sitting in living recliner visit. Patient denies recent falls. Fall and safety precautions discussed. Patient continues to use RW to assist with ambulation.  Patient continues to be SUP-MIN A with all ADL's. Patient states that she fel stronger and is doing a lot better, but trying not to over do it ? ?Patient receiving home health services from [facility]), patient is need of SLP still at this point.  ?  ?  ?Psychosocial assessment: completed. No additional physical needs identified at this time.  ? ?Patient is now receiving medication delivery with  apothecary, and state it is going well.  ? ?Life alert system dicussed, and encouraged for patient and spouse to outreach [facility] insurance for possible coverage. ?  ?Patient/daughter Tasha Moss has been in contact with [employer] respite support program and has been approved for their $500 grant to cover in home support. Per patient they are ot going to pursue the program as she and family feel that she is doing much better now and does not need the in home support, as well as coming against barriers of locating an agency that has availability for possible time that patient would need assistance - mainly at nighttime. The grant is available for 3 months. Ongoing support/resources will continued to be offered if needed.  ?  ?  ?Medical assessment: RN reviewed medications and took vitals.  ?  ? ?  ?SW discussed goals, reviewed care plan, provided emotional support, used active and reflective listening in the form of reciprocity emotional response. Questions and concerns were addressed. The patient/family was encouraged to call with any additional questions and/or concerns. PC Provided general support and encouragement, no other unmet needs identified. Will continue to follow. ? ?Next PC visit scheduled with PC NP 6/29 '@11am' , ?  ?3.         PATIENT/CAREGIVER EDUCATION/ COPING:   ?Appearance: well groomed, appropriate given situation  ?Mental Status: Alert/oriented. ?Eye Contact: Good.  Able to engage in proper eye contact  ?Thought Process: rational  ?Thought Content: not assessed  ?Speech: Normal rate, volume, tone  ?Mood: Normal and calm ?Affect: Congruent to endorsed mood, full ranging ?Insight: normal ?Judgement: normal  ?Interaction Style: Cooperative ? ?Patient A&O, patient engaged in fluent conversation and answered all questions appropriately. Patients mood was jovial during visit today. PHQ 9 is 0. Patient enjoys sitting outside and taking dog for walks. ?  ?4.         PERSONAL EMERGENCY PLAN:  Patient will call 9-1-1 for  emergencies.   ?  ?5.         COMMUNITY RESOURCES COORDINATION/ HEALTH CARE NAVIGATION:  Patient and children manages his care. ?  ?6.         FINANCIAL CONCERNS/NEEDS: None. ?                       Primary Health Insurance: [facility] Medicare ?Secondary Health Insurance: N/A ?Prescription Coverage: Yes, no history of difficulty obtaining or affording prescriptions reported ?   ? ?SOCIAL HX:  ?Social History  ? ?Tobacco Use  ? Smoking status: Never  ? Smokeless tobacco: Never  ?Substance Use Topics  ? Alcohol use: No  ? ? ?CODE STATUS: DNR ?ADVANCED DIRECTIVES: Y ?MOST FORM COMPLETE:  N ?HOSPICE EDUCATION PROVIDED: N ? ?PPS: Patient continues to be SUP-MIN A with all ADL's. Patient has goof insight and judgement.  ? ? ?Time spent: 40 min  ? ? ? ? ? ?Doreene Eland, LCSW ? ?

## 2022-04-10 NOTE — Progress Notes (Signed)
? ? ?  Chronic Care Management ?Pharmacy Assistant  ? ?Name: RINOA GARRAMONE  MRN: [id number] DOB: 05/29/1936 ? ? ?Reason for Encounter: Unenroll from CCM  ? ? ? Per Chart review as of 01/30/22 patient is in Palliative Care and unenrolled from CCM.  ? ? ?Liza Showfety, CCMA ?Clinical Pharmacist Assistant  ?([phone] ? ? ?

## 2022-04-16 DIAGNOSIS — E43 Unspecified severe protein-calorie malnutrition: Secondary | ICD-10-CM | POA: Diagnosis not present

## 2022-04-16 DIAGNOSIS — R627 Adult failure to thrive: Secondary | ICD-10-CM | POA: Diagnosis not present

## 2022-04-16 DIAGNOSIS — K222 Esophageal obstruction: Secondary | ICD-10-CM | POA: Diagnosis not present

## 2022-04-16 DIAGNOSIS — I5032 Chronic diastolic (congestive) heart failure: Secondary | ICD-10-CM | POA: Diagnosis not present

## 2022-04-16 DIAGNOSIS — R1310 Dysphagia, unspecified: Secondary | ICD-10-CM | POA: Diagnosis not present

## 2022-04-16 DIAGNOSIS — M6281 Muscle weakness (generalized): Secondary | ICD-10-CM | POA: Diagnosis not present

## 2022-04-16 DIAGNOSIS — Z431 Encounter for attention to gastrostomy: Secondary | ICD-10-CM | POA: Diagnosis not present

## 2022-04-16 DIAGNOSIS — I10 Essential (primary) hypertension: Secondary | ICD-10-CM | POA: Diagnosis not present

## 2022-04-17 DIAGNOSIS — H6123 Impacted cerumen, bilateral: Secondary | ICD-10-CM | POA: Diagnosis not present

## 2022-04-24 DIAGNOSIS — R627 Adult failure to thrive: Secondary | ICD-10-CM | POA: Diagnosis not present

## 2022-04-24 DIAGNOSIS — K222 Esophageal obstruction: Secondary | ICD-10-CM | POA: Diagnosis not present

## 2022-04-24 DIAGNOSIS — I10 Essential (primary) hypertension: Secondary | ICD-10-CM | POA: Diagnosis not present

## 2022-04-24 DIAGNOSIS — E43 Unspecified severe protein-calorie malnutrition: Secondary | ICD-10-CM | POA: Diagnosis not present

## 2022-04-24 DIAGNOSIS — I5032 Chronic diastolic (congestive) heart failure: Secondary | ICD-10-CM | POA: Diagnosis not present

## 2022-04-24 DIAGNOSIS — R1310 Dysphagia, unspecified: Secondary | ICD-10-CM | POA: Diagnosis not present

## 2022-04-24 DIAGNOSIS — M6281 Muscle weakness (generalized): Secondary | ICD-10-CM | POA: Diagnosis not present

## 2022-04-24 DIAGNOSIS — Z431 Encounter for attention to gastrostomy: Secondary | ICD-10-CM | POA: Diagnosis not present

## 2022-04-29 ENCOUNTER — Ambulatory Visit (INDEPENDENT_AMBULATORY_CARE_PROVIDER_SITE_OTHER): Payer: Medicare Other | Admitting: Family Medicine

## 2022-04-29 ENCOUNTER — Encounter: Payer: Self-pay | Admitting: Family Medicine

## 2022-04-29 ENCOUNTER — Telehealth: Payer: Self-pay

## 2022-04-29 VITALS — BP 106/60 | HR 87 | Temp 97.4°F | Resp 16 | Ht 65.0 in | Wt 124.2 lb

## 2022-04-29 DIAGNOSIS — R21 Rash and other nonspecific skin eruption: Secondary | ICD-10-CM | POA: Diagnosis not present

## 2022-04-29 MED ORDER — PREDNISONE 5 MG/5ML PO SOLN: ORAL | 0 refills | Status: AC

## 2022-04-29 MED ORDER — TRIAMCINOLONE ACETONIDE 0.1 % EX OINT: 1.0000 "application " | TOPICAL_OINTMENT | Freq: Two times a day (BID) | CUTANEOUS | 1 refills | Status: AC

## 2022-04-29 NOTE — Telephone Encounter (Signed)
Ok to switch to Prednisolone '10mg'$ /28m and do '30mg'$  x3 days, then '20mg'$  x3 days, then '10mg'$  x3 days.  Disp 943m no refills

## 2022-04-29 NOTE — Patient Instructions (Signed)
Follow up as needed or as scheduled START the Prednisone daily as directed via tube USE the Triamcinolone ointment on the itchy areas twice a day Call with any questions or concerns Hang in there!!!

## 2022-04-29 NOTE — Telephone Encounter (Signed)
I sent the message to Dr Birdie Riddle and explained the pharmacy doe not have the Prednisolone solution that they only have the prednisolone tablets . Waiting on response

## 2022-04-29 NOTE — Telephone Encounter (Signed)
Pharmacy called and stated they do not have the prednisone solution, asking for verbal orders to change medication. Transferred call to [facility].

## 2022-04-29 NOTE — Telephone Encounter (Signed)
Spoke w/ pharmacist and advised that Dr Birdie Riddle states we can switch to Prednisolone 10 mg/69m and do 30 mg x 3 days, then 20 mg x 3 days then 10 mg x 3 days disp #90 no refills. I did call and advise the pt as well .

## 2022-04-29 NOTE — Progress Notes (Signed)
   Subjective:    Patient ID: Cherlynn Perches, female    DOB: 11-30-1936, 86 y.o.   MRN: [id number]  HPI Poison ivy- husband was pulling vines and got plant oil on his hands.  Husband has to assist pt w/ almost all ADLs.  She first noticed a spot under her G tube dressing.  Now has a rash all along her waistband, her bottom, her inner thighs.  Has been taking Benadryl, using calamine lotion, benadryl cream.  Areas are very itchy   Review of Systems For ROS see HPI     Objective:   Physical Exam Vitals reviewed.  Constitutional:      General: She is not in acute distress.    Appearance: Normal appearance. She is not ill-appearing.  HENT:     Head: Normocephalic and atraumatic.  Skin:    General: Skin is warm and dry.     Findings: Rash (linear vesicular rash on L inner thigh, R posterolateral thigh, on abd, and buttocks) present.  Neurological:     Mental Status: She is alert and oriented to person, place, and time.  Psychiatric:        Mood and Affect: Mood normal.        Behavior: Behavior normal.          Assessment & Plan:  Rash- new.  Areas in question certainly appear to be contact dermatitis and husband does have similar sxs on his hands after working outside.  Given the distribution and severity of itching will do liquid Prednisone to use via Gtube and topical Triamcinolone ointment.  Pt expressed understanding and is in agreement w/ plan.

## 2022-04-30 DIAGNOSIS — M6281 Muscle weakness (generalized): Secondary | ICD-10-CM | POA: Diagnosis not present

## 2022-04-30 DIAGNOSIS — R627 Adult failure to thrive: Secondary | ICD-10-CM | POA: Diagnosis not present

## 2022-04-30 DIAGNOSIS — I5032 Chronic diastolic (congestive) heart failure: Secondary | ICD-10-CM | POA: Diagnosis not present

## 2022-04-30 DIAGNOSIS — Z431 Encounter for attention to gastrostomy: Secondary | ICD-10-CM | POA: Diagnosis not present

## 2022-04-30 DIAGNOSIS — I10 Essential (primary) hypertension: Secondary | ICD-10-CM | POA: Diagnosis not present

## 2022-04-30 DIAGNOSIS — E43 Unspecified severe protein-calorie malnutrition: Secondary | ICD-10-CM | POA: Diagnosis not present

## 2022-04-30 DIAGNOSIS — R1310 Dysphagia, unspecified: Secondary | ICD-10-CM | POA: Diagnosis not present

## 2022-04-30 DIAGNOSIS — K222 Esophageal obstruction: Secondary | ICD-10-CM | POA: Diagnosis not present

## 2022-05-08 DIAGNOSIS — E43 Unspecified severe protein-calorie malnutrition: Secondary | ICD-10-CM | POA: Diagnosis not present

## 2022-05-08 DIAGNOSIS — I10 Essential (primary) hypertension: Secondary | ICD-10-CM | POA: Diagnosis not present

## 2022-05-08 DIAGNOSIS — I5032 Chronic diastolic (congestive) heart failure: Secondary | ICD-10-CM | POA: Diagnosis not present

## 2022-05-08 DIAGNOSIS — M6281 Muscle weakness (generalized): Secondary | ICD-10-CM | POA: Diagnosis not present

## 2022-05-08 DIAGNOSIS — K222 Esophageal obstruction: Secondary | ICD-10-CM | POA: Diagnosis not present

## 2022-05-08 DIAGNOSIS — R627 Adult failure to thrive: Secondary | ICD-10-CM | POA: Diagnosis not present

## 2022-05-08 DIAGNOSIS — Z431 Encounter for attention to gastrostomy: Secondary | ICD-10-CM | POA: Diagnosis not present

## 2022-05-08 DIAGNOSIS — R1310 Dysphagia, unspecified: Secondary | ICD-10-CM | POA: Diagnosis not present

## 2022-05-13 DIAGNOSIS — M6281 Muscle weakness (generalized): Secondary | ICD-10-CM | POA: Diagnosis not present

## 2022-05-13 DIAGNOSIS — I5032 Chronic diastolic (congestive) heart failure: Secondary | ICD-10-CM | POA: Diagnosis not present

## 2022-05-13 DIAGNOSIS — R627 Adult failure to thrive: Secondary | ICD-10-CM | POA: Diagnosis not present

## 2022-05-13 DIAGNOSIS — I10 Essential (primary) hypertension: Secondary | ICD-10-CM | POA: Diagnosis not present

## 2022-05-13 DIAGNOSIS — R1310 Dysphagia, unspecified: Secondary | ICD-10-CM | POA: Diagnosis not present

## 2022-05-13 DIAGNOSIS — E43 Unspecified severe protein-calorie malnutrition: Secondary | ICD-10-CM | POA: Diagnosis not present

## 2022-05-13 DIAGNOSIS — K222 Esophageal obstruction: Secondary | ICD-10-CM | POA: Diagnosis not present

## 2022-05-13 DIAGNOSIS — Z431 Encounter for attention to gastrostomy: Secondary | ICD-10-CM | POA: Diagnosis not present

## 2022-05-21 DIAGNOSIS — K222 Esophageal obstruction: Secondary | ICD-10-CM | POA: Diagnosis not present

## 2022-05-21 DIAGNOSIS — E43 Unspecified severe protein-calorie malnutrition: Secondary | ICD-10-CM | POA: Diagnosis not present

## 2022-05-21 DIAGNOSIS — R627 Adult failure to thrive: Secondary | ICD-10-CM | POA: Diagnosis not present

## 2022-05-21 DIAGNOSIS — I10 Essential (primary) hypertension: Secondary | ICD-10-CM | POA: Diagnosis not present

## 2022-05-21 DIAGNOSIS — M6281 Muscle weakness (generalized): Secondary | ICD-10-CM | POA: Diagnosis not present

## 2022-05-21 DIAGNOSIS — Z431 Encounter for attention to gastrostomy: Secondary | ICD-10-CM | POA: Diagnosis not present

## 2022-05-21 DIAGNOSIS — R1310 Dysphagia, unspecified: Secondary | ICD-10-CM | POA: Diagnosis not present

## 2022-05-21 DIAGNOSIS — I5032 Chronic diastolic (congestive) heart failure: Secondary | ICD-10-CM | POA: Diagnosis not present

## 2022-06-06 ENCOUNTER — Other Ambulatory Visit: Payer: Medicare Other

## 2022-06-06 VITALS — BP 125/68 | HR 73 | Temp 97.5°F | Wt 124.0 lb

## 2022-06-06 DIAGNOSIS — Z515 Encounter for palliative care: Secondary | ICD-10-CM

## 2022-06-06 NOTE — Progress Notes (Signed)
COMMUNITY PALLIATIVE CARE SW NOTE  PATIENT NAME: Tasha Moss DOB: 10/01/1936 MRN: [id number]  PRIMARY CARE PROVIDER: [name], MD  RESPONSIBLE PARTY:  Acct ID - Guarantor Home Phone Work Phone Relationship Acct Type  [id number] Tasha Moss, Tasha Moss* [phone]  Self P/F     [place], [place], [place] [address]     PLAN OF CARE and INTERVENTIONS:                  GOALS OF CARE/ ADVANCE CARE PLANNING: Goals include to maximize quality of life and symptom management. Our advance care planning conversation included a discussion about:         Patient is a FULL CODE. Patients husband has HCPOA with daughter Tasha Moss as back up.   2.        Palliative care encounter: Palliative medicine team will continue to support patient, patient's family, and medical team. Visit consisted of counseling and education dealing with the complex and emotionally intense issues of symptom management and palliative care in the setting of serious and potentially life-threatening illness.  SW and RN met with patient and husband in home visit. Patient lives in one story home with spouse  Functional changes/updates: Patient has not had any functional changes since his recent PC visit. Patient was sitting in living recliner visit. Patient denies recent falls. Fall and safety precautions discussed. Patient continues to use RW to assist with ambulation.  Patient continues to be SUP with all ADL's. Patient states that she continues to feel stronger and is doing a lot better and able to do a lot more on her own. but trying not to over do it   Patient is no longer receiving [facility] or therapy services at this time, Patient continues to receive tube feeding and supplies from [facility] [place] of [place] [phone].  Protein calorie malnutrition/weight loss/Appetite: Patient's continues to receive jevity 1.5 8oz Q HS. No weight loss concerns.   Psychosocial assessment: completed.   Home  support: patient spouse assist with needs. Patient chose not to pursue the [employer] grant at this time.  Transportation: no needs.  Food: no needs.   Ongoing support/resources will continued to be offered if needed.   Medical assessment: RN reviewed medications and took vitals.   SW discussed goals, reviewed care plan, provided emotional support, used active and reflective listening in the form of reciprocity emotional response. Family wishes to keep patient in the home for as long as possible. Questions and concerns were addressed. The patient/family was encouraged to call with any additional questions and/or concerns. PC Provided general support and encouragement, no other unmet needs identified. Will continue to follow.  3.         PATIENT/CAREGIVER EDUCATION/ COPING:   Appearance: well groomed, appropriate  Mental Status: alert and oriented  Eye Contact: good Thought Process: rational  Thought Content: good  Speech: Normal rate, volume, tone  Mood: Normal and calm Affect: Congruent to endorsed mood, full ranging Insight: good Judgement: good Interaction Style: Cooperative   Patient A&O, patient engaged in fluent conversation and answered all questions appropriately. Patients mood was jovial during visit today. PHQ 9 is 3, indicating minimal depression mainly due not being able to eat anything PO, patient share that she does not get out of the house as often as much due to motion sickness and some b/b concerns. SW provided supportive listening and validation of isolated feeling and feelings. Patient enjoys reading and coloring.  Family is supportive  and provides physical and emotional support to patient.   4.         PERSONAL EMERGENCY PLAN:  Patient will call 9-1-1 for emergencies.    5.         COMMUNITY RESOURCES COORDINATION/ HEALTH CARE NAVIGATION:  Patient and family manages her care.  6.         FINANCIAL CONCERNS/NEEDS: None.                        Primary Health  Insurance: [facility] Medicare Secondary Health Insurance: N/A Prescription Coverage: Yes, no history of difficulty obtaining or affording prescriptions reported      SOCIAL HX:  Social History   Tobacco Use   Smoking status: Never   Smokeless tobacco: Never  Substance Use Topics   Alcohol use: No    CODE STATUS: FULL CODE ADVANCED DIRECTIVES: Y MOST FORM COMPLETE:  N HOSPICE EDUCATION PROVIDED: N   PPS: Patient is SUP with all ADL's at this time. Patient is A&O  with good  insight and judgement.     Time spent: 45 min      [place], [place]

## 2022-06-06 NOTE — Progress Notes (Signed)
PATIENT NAME: Tasha Moss DOB: 19-Nov-1936 MRN: [id number]  PRIMARY CARE PROVIDER: [name], MD  RESPONSIBLE PARTY:  Acct ID - Guarantor Home Phone Work Phone Relationship Acct Type  [id number] Tasha Moss, Tasha [phone]  Self P/F     [address] [place], [place],  [address]    Joint visit completed with [place], SW, patient and spouse-Charlie.  Debility:  No recent falls.  Using a rolling walker for ambulation.  No longer receiving home health therapy.  Spouse is assisting with most  ADL's.  Patient is cooking meals for her spouse but takes breaks as needed.  No longer needs assistance to get out of the chair and feels she is stronger than she was on our previous visit.   Dysphagia:  Patient continues with tube feedings that spouse is managing. Feedings occur for 18 hours a day and she is up to 65 cc/hr.  Tolerating feeding well but has occasional nausea.  Supplies are through [employer] [facility].  Spouse endorses calls from company about the time supplies are need.  PCP is managing these orders.  Medication Management:  Continues with bubble pack at [facility] with delivery once a month.   Incontinence:  Patient endorses urinary incontinence with use of depends. No reports of UTI symptoms.   Skin breakdown:  Resolved.  Spouse advises he continues to use fanny cream daily.   PHYSICAL EXAM:   VITALS: Today's Vitals   06/06/22 1107  BP: 125/68  Pulse: 73  Temp: (!) 97.5 F (36.4 C)  SpO2: 94%  Weight: 124 lb (56.2 kg)  PainSc: 0-No pain    LUNGS: clear to auscultation  CARDIAC: Cor RRR}  EXTREMITIES: - for edema SKIN: Skin color, texture, turgor normal. No rashes or lesions or mobility and turgor normal  NEURO: positive for gait problems       Lorenza Burton, RN

## 2022-06-07 ENCOUNTER — Other Ambulatory Visit: Payer: Self-pay | Admitting: Family Medicine

## 2022-06-10 NOTE — Progress Notes (Deleted)
Chronic Care Management Pharmacy Note  06/10/2022 Name:  Tasha Moss MRN:  [id number] DOB:  [birth year]  Recommendations/Changes made from today's visit: No changes  Subjective: Tasha Moss is an 86 y.o. year old female who is a primary patient of Tabori, Aundra Millet, MD.  The CCM team was consulted for assistance with disease management and care coordination needs.    Engaged with patient by telephone for follow up visit in response to provider referral for pharmacy case management and/or care coordination services.   Consent to Services:  The patient was given information about Chronic Care Management services, agreed to services, and gave verbal consent prior to initiation of services.  Please see initial visit note for detailed documentation.   Patient Care Team: [name], MD as PCP - General (Family Medicine) Inda Castle, MD (Inactive) as Consulting Physician (Gastroenterology) Leta Baptist, MD as Consulting Physician (Otolaryngology) Erline Levine, MD as Consulting Physician (Neurosurgery) Renette Butters, MD as Attending Physician (Orthopedic Surgery) Harriett Sine, MD as Consulting Physician (Dermatology) Edythe Clarity, [facility] (Pharmacist)   Objective:  Lab Results  Component Value Date   CREATININE 0.48 02/18/2022   CREATININE 0.37 (L) 01/22/2022   CREATININE 0.36 (L) 01/20/2022    Lab Results  Component Value Date   HGBA1C 5.5 01/04/2015   Last diabetic Eye exam: No results found for: "HMDIABEYEEXA"  Last diabetic Foot exam: No results found for: "HMDIABFOOTEX"      Component Value Date/Time   CHOL 144 05/15/2021 1337   TRIG 116.0 05/15/2021 1337   HDL 49.40 05/15/2021 1337   CHOLHDL 3 05/15/2021 1337   VLDL 23.2 05/15/2021 1337   LDLCALC 71 05/15/2021 1337   LDLCALC 87 11/10/2019 0931   LDLDIRECT 82.0 04/09/2017 1339       Latest Ref Rng & Units 02/18/2022    4:23 PM 01/16/2022    3:52 AM 01/11/2022    3:48 AM  Hepatic  Function  Total Protein 6.0 - 8.3 g/dL 6.8  5.5  5.4   Albumin 3.5 - 5.2 g/dL 3.6  2.8  2.8   AST 0 - 37 U/L _0 ALT 0 - 35 U/L _1 Alk Phosphatase 39 - 117 U/L 75  62  60   Total Bilirubin 0.2 - 1.2 mg/dL 0.5  0.4  0.6     Lab Results  Component Value Date/Time   TSH 5.758 (H) 01/11/2022 02:28 PM   TSH 6.567 (H) 01/08/2022 03:35 AM   TSH 0.27 (L) 12/07/2021 11:58 AM   TSH 0.36 10/10/2021 01:26 PM   FREET4 1.18 (H) 01/08/2022 03:55 AM   FREET4 2.0 (H) 11/10/2019 09:31 AM       Latest Ref Rng & Units 02/18/2022    4:23 PM 01/16/2022    3:52 AM 01/12/2022    4:25 AM  CBC  WBC 4.0 - 10.5 K/uL 7.6  5.8  6.6   Hemoglobin 12.0 - 15.0 g/dL 14.1  12.4  12.6   Hematocrit 36.0 - 46.0 % 42.8  37.3  38.9   Platelets 150.0 - 400.0 K/uL 225.0  253  260     Lab Results  Component Value Date/Time   VD25OH 49.64 01/22/2022 03:59 AM   VD25OH 33.35 10/10/2021 01:26 PM    Clinical ASCVD:  The ASCVD Risk score (Arnett DK, et al., 2019) failed to calculate for the following reasons:   The 2019 ASCVD risk score is  only valid for ages 44 to 74     Social History   Tobacco Use  Smoking Status Never  Smokeless Tobacco Never   BP Readings from Last 3 Encounters:  06/06/22 125/68  04/29/22 106/60  04/10/22 114/66   Pulse Readings from Last 3 Encounters:  06/06/22 73  04/29/22 87  04/10/22 86   Wt Readings from Last 3 Encounters:  06/06/22 124 lb (56.2 kg)  04/29/22 124 lb 3.2 oz (56.3 kg)  04/10/22 122 lb 8 oz (55.6 kg)    Assessment: Review of patient past medical history, allergies, medications, health status, including review of consultants reports, laboratory and other test data, was performed as part of comprehensive evaluation and provision of chronic care management services.   SDOH:  (Social Determinants of Health) assessments and interventions performed:    CCM Care Plan  Allergies  Allergen Reactions   Gabapentin     hallucinations   Other      Strong pain medications cause n/v   Morphine     Nausea & vomiting    Medications Reviewed Today     Reviewed by [name], MD (Physician) on 04/29/22 at 1137  Med List Status: <None>   Medication Order Taking? Sig Documenting Provider Last Dose Status Informant  acetaminophen (TYLENOL) 500 MG tablet [id number] Yes Take 500 mg by mouth every 6 (six) hours as needed for moderate pain. [provider] Taking Active Multiple Informants  diclofenac Sodium (VOLTAREN) 1 % GEL [id number] Yes Apply 2 g topically 4 (four) times daily as needed (Pain). Gerlene Fee, NP Taking Active Multiple Informants  esomeprazole (NEXIUM) 40 MG capsule [id number] Yes Take 40 MG place in feeding tube two times daily [name], MD Taking Active   FLUoxetine (PROZAC) 20 MG capsule [id number] Yes Take 1 capsule (20 mg total) by mouth daily. [name], MD Taking Active   fluticasone Asencion Islam) 50 MCG/ACT nasal spray [id number] Yes Place 1 spray into both nostrils daily. [provider] Taking Active Multiple Informants  levothyroxine (SYNTHROID) 125 MCG tablet [id number] Yes Take 1 tablet (125 mcg total) by mouth daily before breakfast. [name], MD Taking Active   loratadine (CLARITIN) 10 MG tablet [id number] Yes Take 10 mg by mouth daily. [provider] Taking Active Multiple Informants  meloxicam (MOBIC) 7.5 MG tablet [id number] Yes Take 1 tablet (7.5 mg total) by mouth 2 (two) times daily. [name], MD Taking Active   Multiple Vitamin (MULTIVITAMIN PO) [id number] Yes Take 400 mg by mouth daily. [provider] Taking Active Multiple Informants  nutrition supplement, Leanord Asal) PACK [id number] Yes Place 1 packet into feeding tube 2 (two) times daily between meals. Terrilee Croak, MD Taking Active   Nutritional Supplements (FEEDING SUPPLEMENT, JEVITY 1.5 CAL/FIBER,) LIQD [id number] Yes Place 50 mL/hr into feeding tube continuous. Dose to be  titrated per RD at home. Terrilee Croak, MD Taking Active   Nutritional Supplements (FEEDING SUPPLEMENT, PROSOURCE TF,) liquid [id number] Yes Place 45 mLs into feeding tube daily. Terrilee Croak, MD Taking Active   ondansetron (ZOFRAN) 8 MG tablet [id number] Yes Take 1 tablet (8 mg total) by mouth 3 (three) times daily before meals.  Patient taking differently: Take 8 mg by mouth every 8 (eight) hours as needed for vomiting or nausea.   Gerlene Fee, NP Taking Active Multiple Informants  Polyvinyl Alcohol-Povidone (ARTIFICIAL TEARS) 5-6 MG/ML SOLN [id number] Yes Place 2 drops into both eyes daily. [provider] Taking Active Multiple Informants  propranolol ER (INDERAL LA) 60 MG 24 hr capsule [id number] Yes Take 60 mg by mouth daily. [provider] Taking Active   UNABLE TO FIND [id number] Yes TUBE FEEDS: 1/2 carton (118 ml) Jevity 1.5 QID with 60 ml free water before and 60 ml free water after each TF bolus and 45 ml Prosource TF (or equivalent) BID.    Maintain this regimen x4 days.    Then increase to 1 carton (237 ml) Jevity 1.5 QID with 60 ml free water before and 60 ml free water after each TF bolus and 45 ml Prosource TF (or equivalent) BID.    This goal regimen will provide 1500 kcal (86% kcal need), 82 grams protein (96% protein need), and 1200 ml free water. Rai, Vernelle Emerald, MD Taking Active   vitamin B-12 (CYANOCOBALAMIN) 250 MCG tablet [id number] Yes Take 250 mcg by mouth daily. [provider] Taking Active Multiple Informants           Med Note Marlowe Sax Mar 04, 2016  3:06 PM)     Water For Irrigation, Sterile (FREE WATER) Bailey Mech [id number] Yes Place 100 mLs into feeding tube 6 (six) times daily. 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 ([place]) 11/06/2021   Elevated BP without diagnosis of hypertension 10/31/2020   Dizziness 10/31/2020   Status post reverse arthroplasty of left shoulder 03/11/2016   Depression with anxiety 03/11/2016   Routine general medical examination at a health care facility 06/29/2014   H/O thyroid nodule 06/21/2013   DIVERTICULOSIS, COLON 07/18/2010   GERD without esophagitis 04/09/2010   Stricture and stenosis of esophagus 02/21/2010   Dysphagia 02/09/2010   Osteoarthritis 11/22/2009   Benign paroxysmal positional vertigo 05/15/2009   HTN (hypertension) 09/28/2008   SPINAL STENOSIS, LUMBAR 09/28/2008   PERNICIOUS ANEMIA 03/28/2008   Hypothyroidism 09/08/2007   HYPERLIPIDEMIA 09/08/2007   RESTLESS LEG SYNDROME 09/08/2007   HASHIMOTO'S THYROIDITIS 02/23/2007    Immunization History  Administered Date(s) Administered   Fluad Quad(high Dose 65+) 09/06/2019, 09/06/2020, 08/24/2021   Influenza, High Dose Seasonal PF 09/16/2013, 09/13/2015, 10/09/2017, 09/18/2018   Influenza,inj,Quad PF,6+ Mos 09/20/2014, 09/09/2016   Moderna Sars-Covid-2 Vaccination 01/01/2020, 01/31/2020, 11/15/2020   PPD Test 03/01/2016   Pneumococcal Conjugate-13 01/03/2015   Pneumococcal-Unspecified 12/10/2011    Conditions to be addressed/monitored: HLD HTN GERD Hypothyroidism   There are no care plans that you recently modified to display for this patient.      Medication Assistance: None required.  Patient affirms current coverage meets needs.  Patient's preferred pharmacy is:  [facility] [address], [place] - [place] [address] [place] [address] Phone: [phone] Fax: [place], [place] [address] [place] [address] Phone: [phone] Fax: [phone] - [place], [place] [address] Phone: [phone] Fax: [phone]   Follow Up:  Patient  agrees to Care Plan and Follow-up.  Future Appointments  Date Time Provider [place]  06/12/2022  3:45 PM [facility] CCM PHARMACIST [facility]  07/01/2022  1:20 PM [name], MD [facility]  08/07/2022  2:15 PM Edrick Kins, DPM [facility]  08/14/2022  1:00 PM [place], Pauline Aus, NP ACP-ACP None   Beverly Milch, PharmD Clinical Pharmacist  [facility] [phone]    Current Barriers:  Maintaining control over hypothyroidism  Pharmacist Clinical Goal(s):  Patient will contact provider office for questions/concerns as evidenced notation of same in electronic health record through collaboration with PharmD and provider.   Interventions: 1:1 collaboration with [name], MD regarding development and update of comprehensive plan of care as evidenced by provider attestation and co-signature Inter-disciplinary care team collaboration (see longitudinal plan of care) Comprehensive medication review performed; medication list updated in electronic medical record  Hypertension (BP goal <140/90) -Controlled -Previous issues with high home BP readings, is very well controlled based off of previous office readings. Has taken on dietary recommendations with reducing daily salt intakes. Continues daily meloxicam to help with pain, withholding has caused significant distress previously.  -Current treatment: Propanolol ER 60 mg once daily (anxiety, tremor)  -Appropriate, Effective, Safe, Accessible -Current home readings: advised not to test at home due to falsely high readings -Current dietary habits: loves to grill. In general trying to restrict  carbohydrates.  -Current exercise habits: no formal exercise -Denies hypotensive/hypertensive symptoms -Counseled on diet and exercise extensively Recommended to continue current medication  Update 12/12/21 Patient continues to report normal BP at home. Is having some dizziness at home but this is ongoing issue. Denies any HA Recommend continue current meds, continue routine monitoring  Hypothyroidism (Goal: prevent TSH oversuppression) -Not ideally controlled -Dose reduced levothyroxine 125 mcg from 137 mcg on 05/16/2021 due to TSH of 0.33. confirmed that patient is now using lower dose and has received from the pharmacy.  -Current treatment  Levothyroxine 125 mcg once daily -Reviewed administration - no issues with consistent use.  -Recommended to continue current medication Pt has TSH recheck already scheduled early 06/2021  Vertigo (Goal: Reduce dizzy spells) -Not ideally controlled -Current treatment  Scopolamine 37m/3days patch - Appropriate, Effective, Safe, Accessible Dramamine 538mdaily - Appropriate, Effective, Safe, Accessible -Medications previously tried: none noted Continues to have dizzy spells, especially when riding in wheel chair or a car Encouraged continued hydration  -Recommended to continue current medication Hopefully the patch will continue to improve symptoms, will continue to monitor and follow up   Patient Goals/Self-Care Activities Patient will:  - take medications as prescribed  Follow Up Plan: [facility]/u call 6 months

## 2022-06-12 ENCOUNTER — Telehealth: Payer: Medicare Other

## 2022-06-12 ENCOUNTER — Other Ambulatory Visit: Payer: Self-pay

## 2022-06-12 DIAGNOSIS — R519 Headache, unspecified: Secondary | ICD-10-CM

## 2022-06-12 MED ORDER — MELOXICAM 7.5 MG PO TABS: ORAL_TABLET | ORAL | 0 refills | Status: DC

## 2022-06-17 ENCOUNTER — Encounter: Payer: Medicare Other | Admitting: Family Medicine

## 2022-07-01 ENCOUNTER — Ambulatory Visit (INDEPENDENT_AMBULATORY_CARE_PROVIDER_SITE_OTHER): Payer: Medicare Other | Admitting: Family Medicine

## 2022-07-01 ENCOUNTER — Telehealth: Payer: Self-pay | Admitting: Family Medicine

## 2022-07-01 ENCOUNTER — Encounter: Payer: Self-pay | Admitting: Family Medicine

## 2022-07-01 VITALS — BP 110/70 | HR 78 | Temp 97.5°F | Resp 17 | Ht 60.0 in | Wt 126.1 lb

## 2022-07-01 DIAGNOSIS — Z0000 Encounter for general adult medical examination without abnormal findings: Secondary | ICD-10-CM | POA: Diagnosis not present

## 2022-07-01 DIAGNOSIS — I10 Essential (primary) hypertension: Secondary | ICD-10-CM

## 2022-07-01 LAB — LIPID PANEL
Cholesterol: 178 mg/dL (ref 0–200)
HDL: 42.4 mg/dL (ref >39.00)
LDL Cholesterol: 107 mg/dL — ABNORMAL HIGH (ref 0–99)
NonHDL: 136.05
Total CHOL/HDL Ratio: 4
Triglycerides: 146 mg/dL (ref 0.0–149.0)
VLDL: 29.2 mg/dL (ref 0.0–40.0)

## 2022-07-01 LAB — HEPATIC FUNCTION PANEL
ALT: 22 U/L (ref 0–35)
AST: 25 U/L (ref 0–37)
Albumin: 3.8 g/dL (ref 3.5–5.2)
Alkaline Phosphatase: 90 U/L (ref 39–117)
Bilirubin, Direct: 0.1 mg/dL (ref 0.0–0.3)
Total Bilirubin: 0.5 mg/dL (ref 0.2–1.2)
Total Protein: 6.7 g/dL (ref 6.0–8.3)

## 2022-07-01 LAB — TSH: TSH: 0.32 u[IU]/mL — ABNORMAL LOW (ref 0.35–5.50)

## 2022-07-01 LAB — CBC WITH DIFFERENTIAL/PLATELET
Basophils Absolute: 0 10*3/uL (ref 0.0–0.1)
Basophils Relative: 0.4 % (ref 0.0–3.0)
Eosinophils Absolute: 0.2 10*3/uL (ref 0.0–0.7)
Eosinophils Relative: 3.5 % (ref 0.0–5.0)
HCT: 39.2 % (ref 36.0–46.0)
Hemoglobin: 13.3 g/dL (ref 12.0–15.0)
Lymphocytes Relative: 13 % (ref 12.0–46.0)
Lymphs Abs: 0.9 10*3/uL (ref 0.7–4.0)
MCHC: 34 g/dL (ref 30.0–36.0)
MCV: 90.9 fl (ref 78.0–100.0)
Monocytes Absolute: 0.7 10*3/uL (ref 0.1–1.0)
Monocytes Relative: 9.4 % (ref 3.0–12.0)
Neutro Abs: 5.2 10*3/uL (ref 1.4–7.7)
Neutrophils Relative %: 73.7 % (ref 43.0–77.0)
Platelets: 196 10*3/uL (ref 150.0–400.0)
RBC: 4.32 Mil/uL (ref 3.87–5.11)
RDW: 14.4 % (ref 11.5–15.5)
WBC: 7 10*3/uL (ref 4.0–10.5)

## 2022-07-01 LAB — BASIC METABOLIC PANEL
BUN: 15 mg/dL (ref 6–23)
CO2: 33 mEq/L — ABNORMAL HIGH (ref 19–32)
Calcium: 9.2 mg/dL (ref 8.4–10.5)
Chloride: 94 mEq/L — ABNORMAL LOW (ref 96–112)
Creatinine, Ser: 0.48 mg/dL (ref 0.40–1.20)
GFR: 86.95 mL/min (ref >60.00)
Glucose, Bld: 114 mg/dL — ABNORMAL HIGH (ref 70–99)
Potassium: 4.2 mEq/L (ref 3.5–5.1)
Sodium: 133 mEq/L — ABNORMAL LOW (ref 135–145)

## 2022-07-01 NOTE — Progress Notes (Signed)
   Subjective:    Patient ID: Tasha Moss, female    DOB: [birth year], 86 y.o.   MRN: [id number]  HPI CPE- UTD on PNA vaccines.  No longer doing mammograms or colon cancer screen.  Patient Care Team    Relationship Specialty Notifications Start End  [name], MD PCP - General Family Medicine  06/29/14    Comment: Laurena Slimmer, Sandy Salaam, MD (Inactive) Consulting Physician Gastroenterology  06/29/14   Leta Baptist, MD Consulting Physician Otolaryngology  10/09/17   Erline Levine, MD Consulting Physician Neurosurgery  10/09/17   Renette Butters, MD Attending Physician Orthopedic Surgery  10/09/17   Harriett Sine, MD Consulting Physician Dermatology  10/09/17   Edythe Clarity, [facility]  Pharmacist  12/12/21    Comment: [phone]    Health Maintenance  Topic Date Due   INFLUENZA VACCINE  07/09/2022   DEXA SCAN  Completed   HPV VACCINES  Aged Out   Pneumonia Vaccine 11+ Years old  Discontinued   TETANUS/TDAP  Discontinued   COVID-19 Vaccine  Discontinued   Zoster Vaccines- Shingrix  Discontinued     Review of Systems Patient reports no adenopathy,fever, weight change, persistant/recurrent hoarseness, chest pain, palpitations, edema, persistant/recurrent cough, hemoptysis, dyspnea (rest/exertional/paroxysmal nocturnal), gastrointestinal bleeding (melena, rectal bleeding), abdominal pain, bowel changes, GU symptoms (dysuria, hematuria, incontinence), Gyn symptoms (abnormal  bleeding, pain),  syncope, memory loss, numbness & tingling, skin/hair/nail changes, abnormal bruising or bleeding, anxiety, or depression.   + GERD + decreased hearing and vision + dysphagia + diffuse weakness    Objective:   Physical Exam General Appearance:    Alert, cooperative, no distress, appears stated age  Head:    Normocephalic, without obvious abnormality, atraumatic  Eyes:    PERRL, conjunctiva/corneas clear, EOM's intact both eyes  Ears:    Hearing aids in place bilaterally  Nose:   Nares  normal, septum midline, mucosa normal, no drainage    or sinus tenderness  Throat:   Lips, mucosa, and tongue normal; teeth and gums normal  Neck:   Supple, symmetrical, trachea midline, no adenopathy;    Thyroid: no enlargement/tenderness/nodules  Back:     Symmetric, no curvature, ROM normal, no CVA tenderness  Lungs:     Clear to auscultation bilaterally, respirations unlabored  Chest Wall:    No tenderness or deformity   Heart:    Regular rate and rhythm, S1 and S2 normal, no murmur, rub   or gallop  Breast Exam:    Deferred  Abdomen:     Soft, non-tender, bowel sounds active all four quadrants,    no masses, no organomegaly.  Gtube present  Genitalia:    Deferred  Rectal:    Extremities:   Extremities normal, atraumatic, no cyanosis or edema  Pulses:   2+ and symmetric all extremities  Skin:   Skin color, texture, turgor normal, no rashes or lesions  Lymph nodes:   Cervical, supraclavicular, and axillary nodes normal  Neurologic:   CNII-XII intact, sensation and reflexes    throughout          Assessment & Plan:

## 2022-07-01 NOTE — Telephone Encounter (Signed)
Caller name: Eduard Clos [place]   On DPR? :yes/no: Yes  Call back number: [phone]  Provider they see: Birdie Riddle   Reason for call: Husband called stating that his wife had in an physical with Dr.Tabori today. Husband want to know what changes were made to pt medication. Check AVS.

## 2022-07-01 NOTE — Assessment & Plan Note (Signed)
Pt is frail but appears better than I have seen her in quite awhile.  No longer doing colon cancer or breast cancer screening.  UTD on immunizations.  Check labs.  Anticipatory guidance provided.

## 2022-07-01 NOTE — Patient Instructions (Signed)
Follow up in 6 months to recheck BP and thyroid We'll notify you of your lab results and make any changes if needed Keep up the good work!  You look great!! Call with any questions or concerns Stay Safe!  Stay Healthy! Enjoy the rest of your summer!!!

## 2022-07-02 ENCOUNTER — Telehealth: Payer: Self-pay

## 2022-07-02 ENCOUNTER — Other Ambulatory Visit: Payer: Self-pay

## 2022-07-02 DIAGNOSIS — E039 Hypothyroidism, unspecified: Secondary | ICD-10-CM

## 2022-07-02 MED ORDER — LEVOTHYROXINE SODIUM 112 MCG PO TABS: 112.0000 ug | ORAL_TABLET | Freq: Every day | ORAL | 3 refills | Status: DC

## 2022-07-02 NOTE — Telephone Encounter (Signed)
-----   Message from [name], MD sent at 07/02/2022  7:21 AM EDT ----- Labs look good w/ exception of mildly low TSH.  Based on this, we will decrease your Levothyroxine to 167mg daily (#30, 3 refills) and repeat your TSH level at a lab only visit in 1 month.

## 2022-07-02 NOTE — Telephone Encounter (Signed)
Spoke to the pt husband this morning to go over her lab results . We have scheduled a lab only visit for Aug 24,2023 for a repeat TSH and the Levothyroxine 112 mcg was sent to the pharmacy for pt . He expressed verbal understanding .

## 2022-07-02 NOTE — Telephone Encounter (Signed)
Spoke w/ pt husband advised of new medication and he made her lab apt for 08/01/22 @ 125pm .

## 2022-07-11 ENCOUNTER — Telehealth: Payer: Self-pay | Admitting: Family Medicine

## 2022-07-11 NOTE — Telephone Encounter (Signed)
Encourage patient to contact the pharmacy for refills or they can request refills through [facility]  (Please schedule appointment if patient has not been seen in over a year)    WHAT PHARMACY WOULD THEY LIKE THIS SENT TO: [facility] Fax [phone] Attn: Edd Arbour Phone: [phone]  MEDICATION NAME & DOSE: Verdene Lennert for feeding tube  NOTES/COMMENTS FROM PATIENT:      [place] office please notify patient: It takes 48-72 hours to process rx refill requests Ask patient to call pharmacy to ensure rx is ready before heading there.

## 2022-07-15 NOTE — Telephone Encounter (Signed)
Please call [facility] and give verbal order for [facility]- use as directed, #4, 3 refills

## 2022-07-15 NOTE — Telephone Encounter (Signed)
Request for feeding tube supplies

## 2022-07-15 NOTE — Telephone Encounter (Signed)
Spoke w/ pharmacist at [facility] . Gave verbal order for Lopez-valve disp # 4 3 refills . For feeding tube supplies

## 2022-07-15 NOTE — Telephone Encounter (Signed)
Pt's husband Eduard Clos calling to check on status of lopez valve.  Please send to [facility] [phone] and phone is [phone].  Please call [place] once sent in; the pharmacy is holding these for pt.

## 2022-08-01 ENCOUNTER — Other Ambulatory Visit (INDEPENDENT_AMBULATORY_CARE_PROVIDER_SITE_OTHER): Payer: Medicare Other

## 2022-08-01 DIAGNOSIS — E039 Hypothyroidism, unspecified: Secondary | ICD-10-CM

## 2022-08-01 LAB — TSH: TSH: 0.37 u[IU]/mL (ref 0.35–5.50)

## 2022-08-02 ENCOUNTER — Other Ambulatory Visit: Payer: Self-pay | Admitting: Family Medicine

## 2022-08-02 DIAGNOSIS — R519 Headache, unspecified: Secondary | ICD-10-CM

## 2022-08-02 NOTE — Progress Notes (Signed)
Informed pt of lab results  

## 2022-08-07 ENCOUNTER — Ambulatory Visit: Payer: Medicare Other | Admitting: Podiatry

## 2022-08-07 DIAGNOSIS — M79674 Pain in right toe(s): Secondary | ICD-10-CM

## 2022-08-07 DIAGNOSIS — B351 Tinea unguium: Secondary | ICD-10-CM

## 2022-08-07 DIAGNOSIS — M79675 Pain in left toe(s): Secondary | ICD-10-CM

## 2022-08-07 NOTE — Patient Instructions (Signed)
Voltaren gel available at any pharmacy

## 2022-08-07 NOTE — Progress Notes (Signed)
   SUBJECTIVE Patient presents to office today complaining of elongated, thickened nails that cause pain while ambulating in shoes.  Patient is unable to trim their own nails. Patient is here for further evaluation and treatment.  Past Medical History:  Diagnosis Date   Allergy    seasonal & dust   Anxiety    Arthritis    DJD   Family history of adverse reaction to anesthesia    n/v    GERD (gastroesophageal reflux disease)    History of vertigo    Hyperlipidemia    Hypertension    Hypothyroidism    PONV (postoperative nausea and vomiting)    Thyroid disease    hypothyroidism post Hashimoto's & partial thyroidectomy    OBJECTIVE General Patient is awake, alert, and oriented x 3 and in no acute distress. Derm Skin is dry and supple bilateral. Negative open lesions or macerations. Remaining integument unremarkable. Nails are tender, long, thickened and dystrophic with subungual debris, consistent with onychomycosis, 1-5 bilateral. No signs of infection noted. Vasc  DP and PT pedal pulses palpable bilaterally. Temperature gradient within normal limits.  Neuro Epicritic and protective threshold sensation grossly intact bilaterally.  Musculoskeletal Exam No symptomatic pedal deformities noted bilateral. Muscular strength within normal limits.  ASSESSMENT 1.  Pain due to onychomycosis of toenails both  PLAN OF CARE 1. Patient evaluated today.  2. Instructed to maintain good pedal hygiene and foot care.  3. Mechanical debridement of nails 1-5 bilaterally performed using a nail nipper. Filed with dremel without incident.  4. Return to clinic in 3 mos.    Edrick Kins, DPM [facility]  Dr. Edrick Kins, DPM    [address], [place] [address]                Office [phone]  Fax [phone]

## 2022-08-14 ENCOUNTER — Other Ambulatory Visit: Payer: Medicare Other | Admitting: Nurse Practitioner

## 2022-08-14 ENCOUNTER — Encounter: Payer: Self-pay | Admitting: Nurse Practitioner

## 2022-08-14 DIAGNOSIS — Z515 Encounter for palliative care: Secondary | ICD-10-CM

## 2022-08-14 DIAGNOSIS — R6889 Other general symptoms and signs: Secondary | ICD-10-CM

## 2022-08-14 DIAGNOSIS — R1310 Dysphagia, unspecified: Secondary | ICD-10-CM | POA: Diagnosis not present

## 2022-08-14 NOTE — Progress Notes (Signed)
[facility] Consult Note Telephone: [phone]  Fax: [phone]    Date of encounter: 08/14/22 9:14 PM PATIENT NAME: Tasha Moss [address] [place] [address]   [phone] (home) [phone] (work) DOB: [birth year] MRN: [id number] PRIMARY CARE PROVIDER:    [name], MD,  [address] [place] [address] [phone]  RESPONSIBLE PARTY:    Contact Information     Name Relation Home Work [place] Daughter   [phone]   Cox,Kimberly Daughter   [phone]   Belva, Koziel [phone]  [phone]      Due to the COVID-19 crisis, this visit was done via telemedicine from my office and it was initiated and consent by this patient and or family.  I connected with  Tasha Moss on 08/14/22 by  telephone as video not available enabled telemedicine application and verified that I am speaking with the correct person using two identifiers.   I discussed the limitations of evaluation and management by telemedicine. The patient expressed understanding and agreed to proceed.  Palliative Care was asked to follow this patient by consultation request of  Tabori, Aundra Millet, MD to address advance care planning and complex medical decision making. This is a follow up visit.                                  ASSESSMENT AND PLAN / RECOMMENDATIONS:  Symptom Management/Plan: 1. Advance Care Planning;  full code; Ongoing discussions of code status, goc   2. Dysphagia, tolerating tube feedings; no residuals; will continue current plan, monitor weights   3. Secretions; discussed patterns, continue to use scopolamine patches OTC; Tasha Moss educated on purpose/use. Tasha Moss verbalized understanding.    4. Palliative care encounter; Palliative care encounter; Palliative medicine team will continue to support patient, patient's family, and medical team. Visit consisted of  counseling and education dealing with the complex and emotionally intense issues of symptom management and palliative care in the setting of serious and potentially life-threatening illness   Follow up Palliative Care Visit: Palliative care will continue to follow for complex medical decision making, advance care planning, and clarification of goals. Return 12 weeks or prn.   I spent 31 minutes providing this consultation. More than 50% of the time in this consultation was spent in counseling and care coordination. PPS: 40% Chief Complaint: Follow up palliative consult for complex medical decision making I spent 31 minutes providing this consultation. More than 50% of the time in this consultation was spent in counseling and care coordination. PPS: 50% Chief Complaint: Follow up palliative consult for complex medical decision making, address goals, manage ongoing symptoms  HISTORY OF PRESENT ILLNESS:  Tasha Moss is a 86 y.o. year old female  with multiple medical problems including CHF, BPPV, hypothyoridism s/p partial thyroidectomy, esophageal stricture, severe dysphagia with g-tube essential to sustain life, FTT. Hospitalized 10/2021 for November 2022 with closed left hip dislocation s/p reduction and was discharged to SNF.  She caught COVID in the rehab, subsequently developed progressive vertigo with nausea, vomiting with progressive dysphagia with issues with passage of solid food. Tasha Moss had a barium swallow 12/27/2021 where she aspirated, became worse, weak, hospitalized 01/07/2022 to 01/23/2022 with peg placed 01/17/2022. Workup failure to thrive, moderate malnutrition, sacral decubitus ulcer stage II, acute CHF with cardiomegaly with pulmonary vascular prominence suggestive of  CHF exacerbation. She was d/c home with therapy, tube feedings. Tasha Moss resides with her husband with assistance of 2 daughters. I called Tasha and Tasha Moss for telephonic telemedicine f/u PC visit. Tasha Moss in  agreement. Tasha Moss in agreement. We talked about how Tasha Moss has been feeling. Tasha Moss endorses she has been feeling good, more independent. Tasha Moss endorses she has been doing well with feedings. Tasha Moss endorses only complaint is increase in secretions. We talked about medications including scopolamine patches which she uses when she goes in a car with her husband. We talked about using for increase in secretions in addition. Tasha Moss endorses she will try it. We talked about ros, no functional changes. Tasha Moss endorses she is improving overall, no recent falls, wounds, infections, hospitalizations. Tasha. Moss endorses no new changes to medical goals, she continues to enjoy life with her husband. Tasha Moss endorses she was thankful for [facility] discussion and f/u visit. Tasha Moss in agreement, therapeutic listening, emotional support provided. Questions answered.  Follow up Palliative Care Visit: Palliative care will continue to follow for complex medical decision making, advance care planning, and clarification of goals. Return 8 weeks or prn   History obtained from review of EMR, discussion with Tasha Moss.  I reviewed available labs, medications, imaging, studies and related documents from the EMR.  Records reviewed and summarized above.   ROS 10 point system reviewed all negative except HPI  Physical Exam: deferred Thank you for the opportunity to participate in the care of Tasha Moss.  The palliative care team will continue to follow. Please call our office at [phone] if we can be of additional assistance.   Avrie Kedzierski Ihor Gully, NP

## 2022-08-25 ENCOUNTER — Other Ambulatory Visit: Payer: Self-pay

## 2022-08-25 ENCOUNTER — Emergency Department (HOSPITAL_COMMUNITY)
Admission: EM | Admit: 2022-08-25 | Discharge: 2022-08-26 | Disposition: A | Discharge status: Home / Self Care | Payer: Medicare Other | Attending: Emergency Medicine | Admitting: Emergency Medicine

## 2022-08-25 ENCOUNTER — Emergency Department (HOSPITAL_COMMUNITY): Payer: Medicare Other

## 2022-08-25 ENCOUNTER — Encounter (HOSPITAL_COMMUNITY): Payer: Self-pay

## 2022-08-25 DIAGNOSIS — N200 Calculus of kidney: Secondary | ICD-10-CM | POA: Diagnosis not present

## 2022-08-25 DIAGNOSIS — I10 Essential (primary) hypertension: Secondary | ICD-10-CM | POA: Diagnosis not present

## 2022-08-25 DIAGNOSIS — Z79899 Other long term (current) drug therapy: Secondary | ICD-10-CM | POA: Insufficient documentation

## 2022-08-25 DIAGNOSIS — R112 Nausea with vomiting, unspecified: Secondary | ICD-10-CM | POA: Diagnosis not present

## 2022-08-25 DIAGNOSIS — R1110 Vomiting, unspecified: Secondary | ICD-10-CM | POA: Diagnosis not present

## 2022-08-25 LAB — CBC WITH DIFFERENTIAL/PLATELET
Abs Immature Granulocytes: 0.03 10*3/uL (ref 0.00–0.07)
Basophils Absolute: 0 10*3/uL (ref 0.0–0.1)
Basophils Relative: 0 %
Eosinophils Absolute: 0 10*3/uL (ref 0.0–0.5)
Eosinophils Relative: 0 %
HCT: 39.7 % (ref 36.0–46.0)
Hemoglobin: 13.4 g/dL (ref 12.0–15.0)
Immature Granulocytes: 0 %
Lymphocytes Relative: 8 %
Lymphs Abs: 0.6 10*3/uL — ABNORMAL LOW (ref 0.7–4.0)
MCH: 31.2 pg (ref 26.0–34.0)
MCHC: 33.8 g/dL (ref 30.0–36.0)
MCV: 92.3 fL (ref 80.0–100.0)
Monocytes Absolute: 0.2 10*3/uL (ref 0.1–1.0)
Monocytes Relative: 3 %
Neutro Abs: 6.4 10*3/uL (ref 1.7–7.7)
Neutrophils Relative %: 89 %
Platelets: 205 10*3/uL (ref 150–400)
RBC: 4.3 MIL/uL (ref 3.87–5.11)
RDW: 13.7 % (ref 11.5–15.5)
WBC: 7.3 10*3/uL (ref 4.0–10.5)
nRBC: 0 % (ref 0.0–0.2)

## 2022-08-25 LAB — URINALYSIS, ROUTINE W REFLEX MICROSCOPIC
Bacteria, UA: NONE SEEN
Bilirubin Urine: NEGATIVE
Glucose, UA: NEGATIVE mg/dL
Hgb urine dipstick: NEGATIVE
Ketones, ur: NEGATIVE mg/dL
Nitrite: NEGATIVE
Protein, ur: NEGATIVE mg/dL
Specific Gravity, Urine: 1.036 — ABNORMAL HIGH (ref 1.005–1.030)
pH: 7 (ref 5.0–8.0)

## 2022-08-25 LAB — COMPREHENSIVE METABOLIC PANEL WITH GFR
ALT: 23 U/L (ref 0–44)
AST: 25 U/L (ref 15–41)
Albumin: 3.5 g/dL (ref 3.5–5.0)
Alkaline Phosphatase: 98 U/L (ref 38–126)
Anion gap: 12 (ref 5–15)
BUN: 18 mg/dL (ref 8–23)
CO2: 26 mmol/L (ref 22–32)
Calcium: 8.7 mg/dL — ABNORMAL LOW (ref 8.9–10.3)
Chloride: 95 mmol/L — ABNORMAL LOW (ref 98–111)
Creatinine, Ser: 0.47 mg/dL (ref 0.44–1.00)
GFR, Estimated: >60 mL/min
Glucose, Bld: 195 mg/dL — ABNORMAL HIGH (ref 70–99)
Potassium: 3.6 mmol/L (ref 3.5–5.1)
Sodium: 133 mmol/L — ABNORMAL LOW (ref 135–145)
Total Bilirubin: 0.5 mg/dL (ref 0.3–1.2)
Total Protein: 7 g/dL (ref 6.5–8.1)

## 2022-08-25 MED ORDER — ONDANSETRON 4 MG PO TBDP
4.0000 mg | ORAL_TABLET | Freq: Once | ORAL | Status: AC
  Administered 2022-08-25: 4 mg via ORAL
  Filled 2022-08-25: qty 1

## 2022-08-25 MED ORDER — ONDANSETRON 4 MG PO TBDP: 4.0000 mg | ORAL_TABLET | Freq: Three times a day (TID) | ORAL | 0 refills | Status: DC | PRN

## 2022-08-25 MED ORDER — SODIUM CHLORIDE 0.9 % IV BOLUS
500.0000 mL | Freq: Once | INTRAVENOUS | Status: AC
  Administered 2022-08-25: 500 mL via INTRAVENOUS

## 2022-08-25 MED ORDER — ONDANSETRON HCL 4 MG/2ML IJ SOLN
4.0000 mg | Freq: Once | INTRAMUSCULAR | Status: AC
  Administered 2022-08-25: 4 mg via INTRAVENOUS
  Filled 2022-08-25: qty 2

## 2022-08-25 MED ORDER — PANTOPRAZOLE SODIUM 40 MG IV SOLR
40.0000 mg | Freq: Once | INTRAVENOUS | Status: AC
  Administered 2022-08-25: 40 mg via INTRAVENOUS
  Filled 2022-08-25: qty 10

## 2022-08-25 MED ORDER — IOHEXOL 300 MG/ML  SOLN
100.0000 mL | Freq: Once | INTRAMUSCULAR | Status: AC | PRN
  Administered 2022-08-25: 100 mL via INTRAVENOUS

## 2022-08-25 MED ORDER — ONDANSETRON 4 MG PO TBDP: 4.0000 mg | ORAL_TABLET | Freq: Once | ORAL | Status: DC

## 2022-08-25 NOTE — ED Provider Notes (Incomplete)
[facility] EMERGENCY DEPARTMENT Provider Note   CSN: [id number] Arrival date & time: 08/25/22  1811     History {Add pertinent medical, surgical, social history, OB history to HPI:1} Chief Complaint  Patient presents with  . Nausea  . Emesis    Tasha Moss is a 86 y.o. female.  Pt complains of vomiting.  Pt has a peg tube and has been vomiting today.  Pt's husband stopped feedings.  He gave emetrol and pt vomited afterwards.    The history is provided by the patient. No language interpreter was used.  Emesis Severity:  Moderate Number of daily episodes:  Multiple Quality:  Stomach contents      Home Medications Prior to Admission medications   Medication Sig Start Date End Date Taking? Authorizing Provider  acetaminophen (TYLENOL) 500 MG tablet Take 500 mg by mouth every 6 (six) hours as needed for moderate pain.    [provider]  diclofenac Sodium (VOLTAREN) 1 % GEL Apply 2 g topically 4 (four) times daily as needed (Pain). 12/04/21   Gerlene Fee, NP  esomeprazole (NEXIUM) 40 MG capsule Take 40 MG place in feeding tube two times daily 03/11/22   [name], MD  FLUoxetine (PROZAC) 20 MG capsule Take 1 capsule (20 mg total) by mouth daily. 02/28/22   [name], MD  fluticasone (FLONASE) 50 MCG/ACT nasal spray Place 1 spray into both nostrils daily.    [provider]  levothyroxine (SYNTHROID) 112 MCG tablet Take 1 tablet (112 mcg total) by mouth daily. 07/02/22   [name], MD  levothyroxine (SYNTHROID) 125 MCG tablet Take 1 tablet (125 mcg total) by mouth daily before breakfast. 02/28/22   [name], MD  loratadine (CLARITIN) 10 MG tablet Take 10 mg by mouth daily.    [provider]  meloxicam (MOBIC) 7.5 MG tablet TAKE (1) TABLET BY MOUTH TWICE DAILY. 08/02/22   [name], MD  Multiple Vitamin (MULTIVITAMIN PO) Take 400 mg by mouth daily.    [provider]  nutrition supplement,  JUVEN, (JUVEN) PACK Place 1 packet into feeding tube 2 (two) times daily between meals. 01/23/22   Terrilee Croak, MD  Nutritional Supplements (FEEDING SUPPLEMENT, JEVITY 1.5 CAL/FIBER,) LIQD Place 50 mL/hr into feeding tube continuous. Dose to be titrated per RD at home. 01/23/22   Terrilee Croak, MD  Nutritional Supplements (FEEDING SUPPLEMENT, PROSOURCE TF,) liquid Place 45 mLs into feeding tube daily. 01/23/22   Dahal, Marlowe Aschoff, MD  ondansetron (ZOFRAN) 8 MG tablet Take 1 tablet (8 mg total) by mouth 3 (three) times daily before meals. Patient taking differently: Take 8 mg by mouth every 8 (eight) hours as needed for vomiting or nausea. 12/04/21   Gerlene Fee, NP  Polyvinyl Alcohol-Povidone (ARTIFICIAL TEARS) 5-6 MG/ML SOLN Place 2 drops into both eyes daily.    [provider]  predniSONE (DELTASONE) 10 MG tablet Take 10 mg by mouth daily. 04/29/22   [provider]  propranolol ER (INDERAL LA) 60 MG 24 hr capsule Take 60 mg by mouth daily. 12/31/21   [provider]  triamcinolone ointment (KENALOG) 0.1 % Apply 1 application. topically 2 (two) times daily. 04/29/22 04/29/23  [name], MD  UNABLE TO FIND TUBE FEEDS: 1/2 carton (118 ml) Jevity 1.5 QID with 60 ml free water before and 60 ml free water after each TF bolus and 45 ml Prosource TF (or equivalent) BID.    Maintain this regimen x4 days.  Then increase to 1 carton (237 ml) Jevity 1.5 QID with 60 ml free water before and 60 ml free water after each TF bolus and 45 ml Prosource TF (or equivalent) BID.    This goal regimen will provide 1500 kcal (86% kcal need), 82 grams protein (96% protein need), and 1200 ml free water. 01/18/22   Rai, Vernelle Emerald, MD  vitamin B-12 (CYANOCOBALAMIN) 250 MCG tablet Take 250 mcg by mouth daily.    [provider]  Water For Irrigation, Sterile (FREE WATER) SOLN Place 100 mLs into feeding tube 6 (six) times daily. 01/23/22   Terrilee Croak, MD      Allergies     Gabapentin, Other, and Morphine    Review of Systems   Review of Systems  Gastrointestinal:  Positive for vomiting.  All other systems reviewed and are negative.   Physical Exam Updated Vital Signs BP (!) 110/52   Pulse 79   Temp (!) 97.5 F (36.4 C) (Axillary)   Resp 18   Ht 5' (1.524 m)   Wt 57.2 kg   SpO2 93%   BMI 24.61 kg/m  Physical Exam Vitals and nursing note reviewed.  Constitutional:      Appearance: She is well-developed.  HENT:     Head: Normocephalic.     Mouth/Throat:     Mouth: Mucous membranes are moist.  Cardiovascular:     Rate and Rhythm: Normal rate.  Pulmonary:     Effort: Pulmonary effort is normal.  Abdominal:     General: Abdomen is flat. There is no distension.  Musculoskeletal:        General: Normal range of motion.  Skin:    General: Skin is warm.  Neurological:     General: No focal deficit present.     Mental Status: She is alert and oriented to person, place, and time.  Psychiatric:        Mood and Affect: Mood normal.     ED Results / Procedures / Treatments   Labs (all labs ordered are listed, but only abnormal results are displayed) Labs Reviewed  CBC WITH DIFFERENTIAL/PLATELET - Abnormal; Notable for the following components:      Result Value   Lymphs Abs 0.6 (*)    All other components within normal limits  COMPREHENSIVE METABOLIC PANEL - Abnormal; Notable for the following components:   Sodium 133 (*)    Chloride 95 (*)    Glucose, Bld 195 (*)    Calcium 8.7 (*)    All other components within normal limits  URINALYSIS, ROUTINE W REFLEX MICROSCOPIC - Abnormal; Notable for the following components:   APPearance HAZY (*)    Specific Gravity, Urine 1.036 (*)    Leukocytes,Ua TRACE (*)    All other components within normal limits    EKG EKG Interpretation  Date/Time:  Sunday August 25 2022 19:06:14 EDT Ventricular Rate:  73 PR Interval:  181 QRS Duration: 103 QT Interval:  442 QTC Calculation: 488 R  Axis:   -6 Text Interpretation: Sinus rhythm Borderline prolonged QT interval ECG OTHERWISE WITHIN NORMAL LIMITS Confirmed by Noemi Chapel [phone]) on 08/25/2022 7:09:56 PM  Radiology CT ABDOMEN PELVIS W CONTRAST  Result Date: 08/25/2022 CLINICAL DATA:  Nausea and vomiting EXAM: CT ABDOMEN AND PELVIS WITH CONTRAST TECHNIQUE: Multidetector CT imaging of the abdomen and pelvis was performed using the standard protocol following bolus administration of intravenous contrast. RADIATION DOSE REDUCTION: This exam was performed according to the departmental dose-optimization program which includes automated  exposure control, adjustment of the mA and/or kV according to patient size and/or use of iterative reconstruction technique. CONTRAST:  126m OMNIPAQUE IOHEXOL 300 MG/ML  SOLN COMPARISON:  01/07/2022 FINDINGS: Lower chest: Minimal right lower lobe scarring is noted. Hepatobiliary: No focal liver abnormality is seen. No gallstones, gallbladder wall thickening, or biliary dilatation. Pancreas: Unremarkable. No pancreatic ductal dilatation or surrounding inflammatory changes. Spleen: Calcified granulomas are noted. Adrenals/Urinary Tract: Right adrenal gland is within normal limits. Stable nodularity of the left adrenal gland is noted. Kidneys demonstrate a normal enhancement pattern bilaterally. Tiny nonobstructing stones are noted in the lower poles bilaterally. The ureters are within normal limits. Bladder is well distended. Stomach/Bowel: Scattered diverticular change of the colon is noted without evidence of diverticulitis. Gastrostomy catheter is noted in place. Appendix is not well visualized. No inflammatory changes to suggest appendicitis are noted. Small bowel and stomach are unremarkable. Vascular/Lymphatic: Aortic atherosclerosis. No enlarged abdominal or pelvic lymph nodes. Reproductive: Uterus is retroflexed but otherwise within normal limits. No adnexal mass is noted. Other: No abdominal wall hernia or  abnormality. No abdominopelvic ascites. Musculoskeletal: Bilateral hip replacements are noted. Degenerative changes of lumbar spine are seen. IMPRESSION: Bilateral nonobstructing renal calculi. Diverticulosis without diverticulitis. Stable nodularity of the left adrenal gland measuring less than 1 cm. No further follow-up is recommended. Electronically Signed   By: MInez CatalinaM.D.   On: 08/25/2022 21:19   DG Chest Port 1 View  Result Date: 08/25/2022 CLINICAL DATA:  Vomiting. EXAM: PORTABLE CHEST 1 VIEW COMPARISON:  Chest radiograph dated 02/18/2022. FINDINGS: The heart size and mediastinal contours are within normal limits. Vascular calcifications are seen in the aortic arch. Both lungs are clear. A left shoulder arthroplasty is noted. IMPRESSION: No active disease. Aortic Atherosclerosis (ICD10-I70.0). Electronically Signed   By: TZerita BoersM.D.   On: 08/25/2022 19:41    Procedures Procedures  {Document cardiac monitor, telemetry assessment procedure when appropriate:1}  Medications Ordered in ED Medications  ondansetron (ZOFRAN) injection 4 mg (4 mg Intravenous Given 08/25/22 1922)  iohexol (OMNIPAQUE) 300 MG/ML solution 100 mL (100 mLs Intravenous Contrast Given 08/25/22 2104)    ED Course/ Medical Decision Making/ A&P                           Medical Decision Making Pt has a history of esophageal stenosis and in ability to swallow.  Pt has a gtube.  Pt has been vomiting today   Amount and/or Complexity of Data Reviewed Labs: ordered. Radiology: ordered.  Risk Prescription drug management.   ***  {Document critical care time when appropriate:1} {Document review of labs and clinical decision tools ie heart score, Chads2Vasc2 etc:1}  {Document your independent review of radiology images, and any outside records:1} {Document your discussion with family members, caretakers, and with consultants:1} {Document social determinants of health affecting pt's care:1} {Document your  decision making why or why not admission, treatments were needed:1} Final Clinical Impression(s) / ED Diagnoses Final diagnoses:  None    Rx / DC Orders ED Discharge Orders     None

## 2022-08-25 NOTE — ED Notes (Signed)
Pt denies vomiting since zofran administration.

## 2022-08-25 NOTE — ED Provider Notes (Signed)
[facility] EMERGENCY DEPARTMENT Provider Note   CSN: [id number] Arrival date & time: 08/25/22  1811     History  Chief Complaint  Patient presents with   Nausea   Emesis    Tasha Moss is a 86 y.o. female.  Pt complains of vomiting.  Pt has a peg tube and has been vomiting today.  Pt's husband stopped feedings.  He gave emetrol and pt vomited afterwards.    The history is provided by the patient. No language interpreter was used.  Emesis Severity:  Moderate Duration:  1 day Timing:  Constant Number of daily episodes:  Multiple Quality:  Stomach contents Progression:  Worsening Chronicity:  New Recent urination:  Normal Relieved by:  Nothing Ineffective treatments:  Antiemetics Associated symptoms: no abdominal pain   Risk factors: prior abdominal surgery   Risk factors: no sick contacts        Home Medications Prior to Admission medications   Medication Sig Start Date End Date Taking? Authorizing Provider  acetaminophen (TYLENOL) 500 MG tablet Take 500 mg by mouth every 6 (six) hours as needed for moderate pain.    [provider]  diclofenac Sodium (VOLTAREN) 1 % GEL Apply 2 g topically 4 (four) times daily as needed (Pain). 12/04/21   Gerlene Fee, NP  esomeprazole (NEXIUM) 40 MG capsule Take 40 MG place in feeding tube two times daily 03/11/22   [name], MD  FLUoxetine (PROZAC) 20 MG capsule Take 1 capsule (20 mg total) by mouth daily. 02/28/22   [name], MD  fluticasone (FLONASE) 50 MCG/ACT nasal spray Place 1 spray into both nostrils daily.    [provider]  levothyroxine (SYNTHROID) 112 MCG tablet Take 1 tablet (112 mcg total) by mouth daily. 07/02/22   [name], MD  levothyroxine (SYNTHROID) 125 MCG tablet Take 1 tablet (125 mcg total) by mouth daily before breakfast. 02/28/22   [name], MD  loratadine (CLARITIN) 10 MG tablet Take 10 mg by mouth daily.    [provider]  meloxicam  (MOBIC) 7.5 MG tablet TAKE (1) TABLET BY MOUTH TWICE DAILY. 08/02/22   [name], MD  Multiple Vitamin (MULTIVITAMIN PO) Take 400 mg by mouth daily.    [provider]  nutrition supplement, JUVEN, (JUVEN) PACK Place 1 packet into feeding tube 2 (two) times daily between meals. 01/23/22   Terrilee Croak, MD  Nutritional Supplements (FEEDING SUPPLEMENT, JEVITY 1.5 CAL/FIBER,) LIQD Place 50 mL/hr into feeding tube continuous. Dose to be titrated per RD at home. 01/23/22   Terrilee Croak, MD  Nutritional Supplements (FEEDING SUPPLEMENT, PROSOURCE TF,) liquid Place 45 mLs into feeding tube daily. 01/23/22   Dahal, Marlowe Aschoff, MD  ondansetron (ZOFRAN) 8 MG tablet Take 1 tablet (8 mg total) by mouth 3 (three) times daily before meals. Patient taking differently: Take 8 mg by mouth every 8 (eight) hours as needed for vomiting or nausea. 12/04/21   Gerlene Fee, NP  Polyvinyl Alcohol-Povidone (ARTIFICIAL TEARS) 5-6 MG/ML SOLN Place 2 drops into both eyes daily.    [provider]  predniSONE (DELTASONE) 10 MG tablet Take 10 mg by mouth daily. 04/29/22   [provider]  propranolol ER (INDERAL LA) 60 MG 24 hr capsule Take 60 mg by mouth daily. 12/31/21   [provider]  triamcinolone ointment (KENALOG) 0.1 % Apply 1 application. topically 2 (two) times daily. 04/29/22 04/29/23  [name], MD  UNABLE TO FIND TUBE FEEDS: 1/2  carton (118 ml) Jevity 1.5 QID with 60 ml free water before and 60 ml free water after each TF bolus and 45 ml Prosource TF (or equivalent) BID.    Maintain this regimen x4 days.    Then increase to 1 carton (237 ml) Jevity 1.5 QID with 60 ml free water before and 60 ml free water after each TF bolus and 45 ml Prosource TF (or equivalent) BID.    This goal regimen will provide 1500 kcal (86% kcal need), 82 grams protein (96% protein need), and 1200 ml free water. 01/18/22   Rai, Vernelle Emerald, MD  vitamin B-12 (CYANOCOBALAMIN) 250 MCG tablet  Take 250 mcg by mouth daily.    [provider]  Water For Irrigation, Sterile (FREE WATER) SOLN Place 100 mLs into feeding tube 6 (six) times daily. 01/23/22   Terrilee Croak, MD      Allergies    Gabapentin, Other, and Morphine    Review of Systems   Review of Systems  Gastrointestinal:  Positive for vomiting. Negative for abdominal pain.  All other systems reviewed and are negative.   Physical Exam Updated Vital Signs BP (!) 110/52   Pulse 79   Temp (!) 97.5 F (36.4 C) (Axillary)   Resp 18   Ht 5' (1.524 m)   Wt 57.2 kg   SpO2 93%   BMI 24.61 kg/m  Physical Exam Vitals and nursing note reviewed.  Constitutional:      Appearance: She is well-developed.  HENT:     Head: Normocephalic.     Mouth/Throat:     Mouth: Mucous membranes are moist.  Cardiovascular:     Rate and Rhythm: Normal rate and regular rhythm.  Pulmonary:     Effort: Pulmonary effort is normal.  Abdominal:     General: Abdomen is flat. There is no distension.     Comments: G tube in place   Musculoskeletal:        General: Normal range of motion.  Skin:    General: Skin is warm.  Neurological:     General: No focal deficit present.     Mental Status: She is alert and oriented to person, place, and time.  Psychiatric:        Mood and Affect: Mood normal.     ED Results / Procedures / Treatments   Labs (all labs ordered are listed, but only abnormal results are displayed) Labs Reviewed  CBC WITH DIFFERENTIAL/PLATELET - Abnormal; Notable for the following components:      Result Value   Lymphs Abs 0.6 (*)    All other components within normal limits  COMPREHENSIVE METABOLIC PANEL - Abnormal; Notable for the following components:   Sodium 133 (*)    Chloride 95 (*)    Glucose, Bld 195 (*)    Calcium 8.7 (*)    All other components within normal limits  URINALYSIS, ROUTINE W REFLEX MICROSCOPIC - Abnormal; Notable for the following components:   APPearance HAZY (*)    Specific  Gravity, Urine 1.036 (*)    Leukocytes,Ua TRACE (*)    All other components within normal limits    EKG EKG Interpretation  Date/Time:  Sunday August 25 2022 19:06:14 EDT Ventricular Rate:  73 PR Interval:  181 QRS Duration: 103 QT Interval:  442 QTC Calculation: 488 R Axis:   -6 Text Interpretation: Sinus rhythm Borderline prolonged QT interval ECG OTHERWISE WITHIN NORMAL LIMITS Confirmed by Noemi Chapel [phone]) on 08/25/2022 7:09:56 PM  Radiology CT ABDOMEN  PELVIS W CONTRAST  Result Date: 08/25/2022 CLINICAL DATA:  Nausea and vomiting EXAM: CT ABDOMEN AND PELVIS WITH CONTRAST TECHNIQUE: Multidetector CT imaging of the abdomen and pelvis was performed using the standard protocol following bolus administration of intravenous contrast. RADIATION DOSE REDUCTION: This exam was performed according to the departmental dose-optimization program which includes automated exposure control, adjustment of the mA and/or kV according to patient size and/or use of iterative reconstruction technique. CONTRAST:  115m OMNIPAQUE IOHEXOL 300 MG/ML  SOLN COMPARISON:  01/07/2022 FINDINGS: Lower chest: Minimal right lower lobe scarring is noted. Hepatobiliary: No focal liver abnormality is seen. No gallstones, gallbladder wall thickening, or biliary dilatation. Pancreas: Unremarkable. No pancreatic ductal dilatation or surrounding inflammatory changes. Spleen: Calcified granulomas are noted. Adrenals/Urinary Tract: Right adrenal gland is within normal limits. Stable nodularity of the left adrenal gland is noted. Kidneys demonstrate a normal enhancement pattern bilaterally. Tiny nonobstructing stones are noted in the lower poles bilaterally. The ureters are within normal limits. Bladder is well distended. Stomach/Bowel: Scattered diverticular change of the colon is noted without evidence of diverticulitis. Gastrostomy catheter is noted in place. Appendix is not well visualized. No inflammatory changes to suggest  appendicitis are noted. Small bowel and stomach are unremarkable. Vascular/Lymphatic: Aortic atherosclerosis. No enlarged abdominal or pelvic lymph nodes. Reproductive: Uterus is retroflexed but otherwise within normal limits. No adnexal mass is noted. Other: No abdominal wall hernia or abnormality. No abdominopelvic ascites. Musculoskeletal: Bilateral hip replacements are noted. Degenerative changes of lumbar spine are seen. IMPRESSION: Bilateral nonobstructing renal calculi. Diverticulosis without diverticulitis. Stable nodularity of the left adrenal gland measuring less than 1 cm. No further follow-up is recommended. Electronically Signed   By: MInez CatalinaM.D.   On: 08/25/2022 21:19   DG Chest Port 1 View  Result Date: 08/25/2022 CLINICAL DATA:  Vomiting. EXAM: PORTABLE CHEST 1 VIEW COMPARISON:  Chest radiograph dated 02/18/2022. FINDINGS: The heart size and mediastinal contours are within normal limits. Vascular calcifications are seen in the aortic arch. Both lungs are clear. A left shoulder arthroplasty is noted. IMPRESSION: No active disease. Aortic Atherosclerosis (ICD10-I70.0). Electronically Signed   By: TZerita BoersM.D.   On: 08/25/2022 19:41    Procedures Procedures    Medications Ordered in ED Medications  ondansetron (ZOFRAN) injection 4 mg (4 mg Intravenous Given 08/25/22 1922)  iohexol (OMNIPAQUE) 300 MG/ML solution 100 mL (100 mLs Intravenous Contrast Given 08/25/22 2104)    ED Course/ Medical Decision Making/ A&P                           Medical Decision Making Pt has a history of esophageal stenosis and in ability to swallow.  Pt has a gtube.  Pt has been vomiting today   Amount and/or Complexity of Data Reviewed Independent Historian: spouse    Details: Pt here with her daughter and husband  External Data Reviewed: notes.    Details: Primary care notes reviewed. Palative care notes reviewed  Labs: ordered. Decision-making details documented in ED Course.     Details: Sodium is 133.  Ua  no acute.  Labs ordered reviewed and interpreted  Radiology: ordered and independent interpretation performed. Decision-making details documented in ED Course.    Details: Ct abdomen ordered and reviewed.  BIlat renal calculus, diverticulosis   Risk Prescription drug management. Risk Details: Pt feels better after zofran iv.  No further vomiting.  Pt given IV fluids x 500cc.  Protonix 40 mg.  Pt given  zofran '4mg'$  4 tablets to take home.  Rx for 10 tablets.  I advised follow up with primary care MD.           Final Clinical Impression(s) / ED Diagnoses Final diagnoses:  Vomiting, unspecified vomiting type, unspecified whether nausea present    Rx / DC Orders ED Discharge Orders     None      An After Visit Summary was printed and given to the patient.    Sidney Ace 08/26/22 1455    Noemi Chapel, MD 08/27/22 2223

## 2022-08-25 NOTE — ED Triage Notes (Signed)
Vomiting since this morning. On a feeding tube. In palliative care. Pt report not being able to swallow at baseline. Last feeding at [facility] by husband due to vomiting.

## 2022-08-25 NOTE — ED Notes (Signed)
Pt placed on 2L O2 via [place] secondary to low O2 sat, pt 87-88 on room air, pt 995 on 2L

## 2022-08-25 NOTE — Discharge Instructions (Addendum)
Return if any problems.  Follow up with your physicain for recheck

## 2022-08-25 NOTE — ED Notes (Signed)
ED Provider at bedside. 

## 2022-08-26 ENCOUNTER — Ambulatory Visit: Payer: Medicare Other

## 2022-08-26 ENCOUNTER — Telehealth: Payer: Self-pay | Admitting: Family Medicine

## 2022-08-26 MED ORDER — ONDANSETRON 8 MG PO TBDP: ORAL_TABLET | ORAL | 0 refills | Status: DC

## 2022-08-26 MED FILL — Ondansetron HCl Tab 4 MG: ORAL | Qty: 4 | Status: AC

## 2022-08-26 NOTE — Telephone Encounter (Signed)
Spoke w/ pt husband . Per DR Birdie Riddle pt needs to be seen in office. Made pt an apt for 08/29/22 @  1pm

## 2022-08-26 NOTE — Telephone Encounter (Signed)
Vomiting Reason for Call Symptomatic / Request for [place] states his wife has been vomiting since this morning and she is not able to keep down anything including the medicine. She is on a feeding tube. [facility] Translation No Nurse Assessment Nurse: Meda Coffee, RN, Maggie Font Date/Time Eilene Ghazi Time): 08/25/2022 5:31:28 PM Confirm and document reason for call. If symptomatic, describe symptoms. ---Caller states his wife has been vomiting since this morning and she is not able to keep down anything including the medicine & she is on a feeding tube (PEG in her stomach). Per husband he gave rapid nausea medication twice called Emeterol & she threw it up two times. Does the patient have any new or worsening symptoms? ---Yes Will a triage be completed? ---Yes Related visit to physician within the last 2 weeks? ---No Does the PT have any chronic conditions? (i.e. diabetes, asthma, this includes High risk factors for pregnancy, etc.) ---No Is this a behavioral health or substance abuse call? ---No Guidelines Guideline Title Affirmed Question Affirmed Notes Nurse Date/Time Eilene Ghazi Time) Feeding Tube Symptoms and Questions [1] Vomiting AND [2] contains bile (green color) Meda Coffee, RN, Maggie Font 08/25/2022 5:34:27 PM PLEASE NOTE: All timestamps contained within this report are represented as [place] Standard Time. CONFIDENTIALTY NOTICE: This fax transmission is intended only for the addressee. It contains information that is legally privileged, confidential or otherwise protected from use or disclosure. If you are not the intended recipient, you are strictly prohibited from reviewing, disclosing, copying using or disseminating any of this information or taking any action in reliance on or regarding this information. If you have received this fax in error, please notify us immediately by telephone so that we can  arrange for its return to [place]. Phone: [phone], Toll-Free: [phone], Fax: [phone] Page: 2 of 2 Call Id: [id number] [place]. Time Eilene Ghazi Time) Disposition Final User 08/25/2022 5:37:33 PM Go to ED Now (or PCP triage) Yes Meda Coffee, RN, Maggie Font Final Disposition 08/25/2022 5:37:33 PM Go to ED Now (or PCP triage) Yes Meda Coffee, RN, Julien Nordmann Disagree/Comply Comply Caller Understands Yes PreDisposition Did not know what to do Care Advice Given Per Guideline GO TO ED NOW (OR PCP TRIAGE): * IF NO PCP (PRIMARY CARE PROVIDER) SECOND-LEVEL TRIAGE: You need to be seen within the next hour. Go to the [place] at _____________ [place] as soon as you can. BRING MEDICINES: * Bring a list of your current medicines when you go to the Emergency Department (ER). * Bring the pill bottles too. This will help the doctor (or NP/PA) to make certain you are taking the right medicines and the right dose. NOTHING BY MOUTH: * Stop all feedings for the present. CARE ADVICE given per Feeding Tube Symptoms and Questions (Adult) guideline Comments User: Janalyn Rouse, RN Date/Time Eilene Ghazi Time): 08/25/2022 5:33:55 PM

## 2022-08-26 NOTE — Telephone Encounter (Signed)
Someone address this before end of day

## 2022-08-26 NOTE — Telephone Encounter (Signed)
Caller name: Milianna Ericsson   On DPR? :yes/no: Yes  Call back number: [phone]  Provider they see:  Birdie Riddle   Reason for call: Pt husband called stating he need to speak with a nurse to get some  guidance with wife after her ER visit. Husband just want to know is he caring for wife correctly.

## 2022-08-27 ENCOUNTER — Telehealth: Payer: Self-pay | Admitting: Family Medicine

## 2022-08-27 NOTE — Telephone Encounter (Signed)
Caller name:  Eduard Clos  (husband)  On DPR? :yes/no: No  Call back number: [phone]   Provider they see: Birdie Riddle  Reason for call:  pt was in ED 08/25/22 for vomiting; states that now she has developed redness/rash on rear end and that it is painful for her to sit down.  Would like to know if they can have an antibiotic called in to [place].  States that she has had this issue in the past. Pt does have an appointment with Dr. Birdie Riddle 08/29/22.  Please advise.

## 2022-08-27 NOTE — Telephone Encounter (Signed)
Eduard Clos (husband) called back.  States that pt is in a lot of pain and rear end is now very red and hot to the touch. Connected them to the nurse line for further assistance.

## 2022-08-28 ENCOUNTER — Encounter: Payer: Self-pay | Admitting: Family Medicine

## 2022-08-28 ENCOUNTER — Ambulatory Visit (INDEPENDENT_AMBULATORY_CARE_PROVIDER_SITE_OTHER): Payer: Medicare Other | Admitting: Family Medicine

## 2022-08-28 VITALS — BP 112/70 | HR 70 | Temp 97.3°F | Resp 18 | Ht 61.0 in | Wt 129.1 lb

## 2022-08-28 DIAGNOSIS — R110 Nausea: Secondary | ICD-10-CM | POA: Diagnosis not present

## 2022-08-28 DIAGNOSIS — L03317 Cellulitis of buttock: Secondary | ICD-10-CM

## 2022-08-28 MED ORDER — CEPHALEXIN 250 MG/5ML PO SUSR: 500.0000 mg | Freq: Two times a day (BID) | ORAL | 0 refills | Status: DC

## 2022-08-28 NOTE — Progress Notes (Signed)
   Subjective:    Patient ID: Tasha Moss, female    DOB: [birth year], 86 y.o.   MRN: [id number]  HPI ER f/u- pt went to ER on 9/17 for vomiting.  Labs were reassuring and CT did not show any acute process.  She was treated w/ IVF and IV Zofran.  She has Gtube in place.  Pt reports 'i feel like I'm going to vomit all the time, but I take what they gave me'.  No longer vomiting.  In the ER on Sunday she did not have any 'breakout on my bottom'.  But now has redness and warmth of buttocks.  Rash extends from one hip to the other.  Denies sores, no oozing.   Review of Systems For ROS see HPI     Objective:   Physical Exam Vitals reviewed.  Constitutional:      General: She is not in acute distress.    Comments: frail  HENT:     Head: Normocephalic and atraumatic.  Cardiovascular:     Rate and Rhythm: Normal rate and regular rhythm.  Pulmonary:     Effort: Pulmonary effort is normal. No respiratory distress.     Breath sounds: No wheezing.  Abdominal:     General: There is no distension.     Palpations: Abdomen is soft.     Tenderness: There is no abdominal tenderness.     Comments: G tube in place  Skin:    General: Skin is warm and dry.     Findings: Erythema (marked erythema across entire backside extending onto upper thighs w/o obvious break in the skin, no fluctuance, no TTP) present.  Neurological:     Mental Status: She is alert and oriented to person, place, and time. Mental status is at baseline.  Psychiatric:     Comments: angry           Assessment & Plan:   Cellulitis- new.  Pt and husband report this is very similar to what she had earlier this year when hospitalized.  Given the band like distribution this appears consistent w/ contact dermatitis- maybe from hospital bed pad that is used.  Pt and husband don't agree.  Will start Keflex (via Gtube given nausea) for infxn and encouraged them to keep skin clean and dry.  Reviewed supportive care and red flags that  should prompt return.  Pt expressed understanding and is in agreement w/ plan.   Nausea- ongoing issue for pt.  She is to use Zofran prn and utilize Gtube when she is unable to swallow.  Pt expressed understanding and is in agreement w/ plan.

## 2022-08-28 NOTE — Telephone Encounter (Signed)
Pt has appointment with Dr. Birdie Riddle today at 2:40.

## 2022-08-28 NOTE — Patient Instructions (Signed)
Follow up by phone or MyChart on Friday to let me know how things look START the Cephalexin twice daily via the Gtube (since you're already having nausea) USE the Ondansetron (Zofran) as needed for nausea Make sure you are keeping up with your fluids- either orally or by your Gtube so you don't get dehydrated Continue to use the Fanny Cream on the painful/raw areas Call with any questions or concerns Hang in there!!!

## 2022-08-29 ENCOUNTER — Ambulatory Visit: Payer: Medicare Other | Admitting: Family Medicine

## 2022-08-30 ENCOUNTER — Telehealth: Payer: Self-pay

## 2022-08-30 NOTE — Telephone Encounter (Signed)
     Patient  visit on 9/17  at [facility] you been able to follow up with your primary care physician? YES  The patient was or was not able to obtain any needed medicine or equipment.YES  Are there diet recommendations that you are having difficulty following?NA  Patient expresses understanding of discharge instructions and education provided has no other needs at this time. [place], [place], Care Management  [phone] [address], [place], [place] [address] Phone: [phone] Email: [email or web address]

## 2022-09-02 ENCOUNTER — Other Ambulatory Visit: Payer: Self-pay

## 2022-09-02 ENCOUNTER — Emergency Department (HOSPITAL_COMMUNITY): Payer: Medicare Other

## 2022-09-02 ENCOUNTER — Inpatient Hospital Stay (HOSPITAL_COMMUNITY)
Admission: EM | Admit: 2022-09-02 | Discharge: 2022-09-06 | DRG: 603 | Disposition: A | Discharge status: Home Health | Payer: Medicare Other | Attending: Internal Medicine | Admitting: Internal Medicine

## 2022-09-02 ENCOUNTER — Encounter (HOSPITAL_COMMUNITY): Payer: Self-pay

## 2022-09-02 ENCOUNTER — Telehealth: Payer: Self-pay | Admitting: Family Medicine

## 2022-09-02 DIAGNOSIS — Z96643 Presence of artificial hip joint, bilateral: Secondary | ICD-10-CM | POA: Diagnosis present

## 2022-09-02 DIAGNOSIS — L89301 Pressure ulcer of unspecified buttock, stage 1: Secondary | ICD-10-CM | POA: Diagnosis not present

## 2022-09-02 DIAGNOSIS — E039 Hypothyroidism, unspecified: Secondary | ICD-10-CM | POA: Diagnosis present

## 2022-09-02 DIAGNOSIS — Z931 Gastrostomy status: Secondary | ICD-10-CM | POA: Diagnosis not present

## 2022-09-02 DIAGNOSIS — Z8249 Family history of ischemic heart disease and other diseases of the circulatory system: Secondary | ICD-10-CM

## 2022-09-02 DIAGNOSIS — Z885 Allergy status to narcotic agent status: Secondary | ICD-10-CM | POA: Diagnosis not present

## 2022-09-02 DIAGNOSIS — I10 Essential (primary) hypertension: Secondary | ICD-10-CM | POA: Diagnosis not present

## 2022-09-02 DIAGNOSIS — R110 Nausea: Secondary | ICD-10-CM | POA: Diagnosis not present

## 2022-09-02 DIAGNOSIS — K219 Gastro-esophageal reflux disease without esophagitis: Secondary | ICD-10-CM

## 2022-09-02 DIAGNOSIS — L259 Unspecified contact dermatitis, unspecified cause: Secondary | ICD-10-CM | POA: Diagnosis not present

## 2022-09-02 DIAGNOSIS — Z20822 Contact with and (suspected) exposure to covid-19: Secondary | ICD-10-CM | POA: Diagnosis not present

## 2022-09-02 DIAGNOSIS — E860 Dehydration: Secondary | ICD-10-CM | POA: Diagnosis not present

## 2022-09-02 DIAGNOSIS — R531 Weakness: Secondary | ICD-10-CM

## 2022-09-02 DIAGNOSIS — L03317 Cellulitis of buttock: Principal | ICD-10-CM | POA: Diagnosis present

## 2022-09-02 DIAGNOSIS — Z79890 Hormone replacement therapy (postmenopausal): Secondary | ICD-10-CM

## 2022-09-02 DIAGNOSIS — R112 Nausea with vomiting, unspecified: Secondary | ICD-10-CM | POA: Diagnosis present

## 2022-09-02 DIAGNOSIS — E782 Mixed hyperlipidemia: Secondary | ICD-10-CM | POA: Diagnosis not present

## 2022-09-02 DIAGNOSIS — I5032 Chronic diastolic (congestive) heart failure: Secondary | ICD-10-CM | POA: Diagnosis present

## 2022-09-02 DIAGNOSIS — R000 Tachycardia, unspecified: Secondary | ICD-10-CM | POA: Diagnosis not present

## 2022-09-02 DIAGNOSIS — Z96612 Presence of left artificial shoulder joint: Secondary | ICD-10-CM | POA: Diagnosis not present

## 2022-09-02 DIAGNOSIS — Z79899 Other long term (current) drug therapy: Secondary | ICD-10-CM | POA: Diagnosis not present

## 2022-09-02 DIAGNOSIS — Z888 Allergy status to other drugs, medicaments and biological substances status: Secondary | ICD-10-CM | POA: Diagnosis not present

## 2022-09-02 DIAGNOSIS — K222 Esophageal obstruction: Secondary | ICD-10-CM

## 2022-09-02 DIAGNOSIS — I110 Hypertensive heart disease with heart failure: Secondary | ICD-10-CM | POA: Diagnosis present

## 2022-09-02 LAB — COMPREHENSIVE METABOLIC PANEL
ALT: 21 U/L (ref 0–44)
AST: 22 U/L (ref 15–41)
Albumin: 3.4 g/dL — ABNORMAL LOW (ref 3.5–5.0)
Alkaline Phosphatase: 94 U/L (ref 38–126)
Anion gap: 7 (ref 5–15)
BUN: 14 mg/dL (ref 8–23)
CO2: 29 mmol/L (ref 22–32)
Calcium: 8.7 mg/dL — ABNORMAL LOW (ref 8.9–10.3)
Chloride: 101 mmol/L (ref 98–111)
Creatinine, Ser: 0.48 mg/dL (ref 0.44–1.00)
GFR, Estimated: >60 mL/min (ref >60)
Glucose, Bld: 130 mg/dL — ABNORMAL HIGH (ref 70–99)
Potassium: 4.4 mmol/L (ref 3.5–5.1)
Sodium: 137 mmol/L (ref 135–145)
Total Bilirubin: 0.7 mg/dL (ref 0.3–1.2)
Total Protein: 6.6 g/dL (ref 6.5–8.1)

## 2022-09-02 LAB — CBC WITH DIFFERENTIAL/PLATELET
Abs Immature Granulocytes: 0.04 10*3/uL (ref 0.00–0.07)
Basophils Absolute: 0 10*3/uL (ref 0.0–0.1)
Basophils Relative: 0 %
Eosinophils Absolute: 0 10*3/uL (ref 0.0–0.5)
Eosinophils Relative: 0 %
HCT: 40.2 % (ref 36.0–46.0)
Hemoglobin: 13.4 g/dL (ref 12.0–15.0)
Immature Granulocytes: 0 %
Lymphocytes Relative: 4 %
Lymphs Abs: 0.4 10*3/uL — ABNORMAL LOW (ref 0.7–4.0)
MCH: 31.6 pg (ref 26.0–34.0)
MCHC: 33.3 g/dL (ref 30.0–36.0)
MCV: 94.8 fL (ref 80.0–100.0)
Monocytes Absolute: 0.4 10*3/uL (ref 0.1–1.0)
Monocytes Relative: 5 %
Neutro Abs: 8.1 10*3/uL — ABNORMAL HIGH (ref 1.7–7.7)
Neutrophils Relative %: 91 %
Platelets: 194 10*3/uL (ref 150–400)
RBC: 4.24 MIL/uL (ref 3.87–5.11)
RDW: 14.3 % (ref 11.5–15.5)
WBC: 9 10*3/uL (ref 4.0–10.5)
nRBC: 0 % (ref 0.0–0.2)

## 2022-09-02 LAB — URINALYSIS, ROUTINE W REFLEX MICROSCOPIC
Bilirubin Urine: NEGATIVE
Glucose, UA: NEGATIVE mg/dL
Hgb urine dipstick: NEGATIVE
Ketones, ur: NEGATIVE mg/dL
Leukocytes,Ua: NEGATIVE
Nitrite: NEGATIVE
Protein, ur: NEGATIVE mg/dL
Specific Gravity, Urine: 1.013 (ref 1.005–1.030)
pH: 8 (ref 5.0–8.0)

## 2022-09-02 LAB — RESP PANEL BY RT-PCR (FLU A&B, COVID) ARPGX2
Influenza A by PCR: NEGATIVE
Influenza B by PCR: NEGATIVE
SARS Coronavirus 2 by RT PCR: NEGATIVE

## 2022-09-02 LAB — TROPONIN I (HIGH SENSITIVITY)
Troponin I (High Sensitivity): 4 ng/L (ref <18)
Troponin I (High Sensitivity): 5 ng/L (ref <18)

## 2022-09-02 LAB — CK: Total CK: 24 U/L — ABNORMAL LOW (ref 38–234)

## 2022-09-02 LAB — MAGNESIUM: Magnesium: 1.9 mg/dL (ref 1.7–2.4)

## 2022-09-02 MED ORDER — ONDANSETRON HCL 4 MG/2ML IJ SOLN
4.0000 mg | Freq: Once | INTRAMUSCULAR | Status: AC
  Administered 2022-09-02: 4 mg via INTRAVENOUS
  Filled 2022-09-02: qty 2

## 2022-09-02 MED ORDER — HALOPERIDOL LACTATE 5 MG/ML IJ SOLN: 5.0000 mg | Freq: Once | INTRAMUSCULAR | Status: DC | PRN

## 2022-09-02 MED ORDER — DOXYCYCLINE HYCLATE 100 MG PO TABS
100.0000 mg | ORAL_TABLET | Freq: Once | ORAL | Status: DC
  Filled 2022-09-02: qty 1

## 2022-09-02 MED ORDER — SODIUM CHLORIDE 0.9 % IV BOLUS
500.0000 mL | Freq: Once | INTRAVENOUS | Status: AC
  Administered 2022-09-02: 500 mL via INTRAVENOUS

## 2022-09-02 MED ORDER — IOHEXOL 300 MG/ML  SOLN: 100.0000 mL | Freq: Once | INTRAMUSCULAR | Status: DC | PRN

## 2022-09-02 NOTE — Telephone Encounter (Signed)
Daughter [name] called back stating that pt needs to hear from a nurse today. Daughter is very upset because no one has returned her mother phone call. Daughter want to know do her mom need to go to the ER or what. Pt has throw medication and  pt is currently shacking. Daughter states that her mom can't go to the bathroom on her own without husband. It hard for husband to help pt to the bathroom. Pt is currently throwing stating daughter. Transferring pt to health team nurse.

## 2022-09-02 NOTE — H&P (Signed)
History and Physical    Patient: Tasha Moss AJO:[id number] DOB: 12-31-1935 DOA: 09/02/2022 DOS: the patient was seen and examined on 09/03/2022 PCP: [name], MD  Patient coming from: Home  Chief Complaint:  Chief Complaint  Patient presents with   Vomiting   HPI: ANNDREA MIHELICH is a 86 y.o. female with medical history significant of hypertension, hyperlipidemia, CHF, hypothyroidism status post partial thyroidectomy, GERD, esophageal stricture (2011) who presents to the emergency department accompanied by daughter due to nausea, worsening weakness and worsening rash in buttock area.  She complained of simvastatin about 10 days ago and she presented to the ED on 9/17 during which blood work-up and CT scan done showed no acute pathology, however, she was diagnosed to have cellulitis of the buttock and was discharged with Keflex.  Patient's weakness worsened to the extent that she was unable to ambulate today (she was able to walk as of 2 days ago), so it was decided for her to go to the ED for further evaluation and management.   ED Course:  In the emergency department, BP was 140/57 but other vital signs were within normal range.  Work-up in the ED showed normal CBC, normal BMP except for blood glucose of 130, total CK24, magnesium 1.9, troponin x2 was negative, urinalysis was normal.  Influenza A, B, SARS coronavirus 2 was negative. Patient was treated with IV hydration, Zofran was given and she was started on IV ceftriaxone for cellulitis.  Hospitalist was asked to admit patient for further evaluation and management.  Review of Systems: Review of systems as noted in the HPI. All other systems reviewed and are negative.    Past Medical History:  Diagnosis Date   Allergy    seasonal & dust   Anxiety    Arthritis    DJD   Family history of adverse reaction to anesthesia    n/v    GERD (gastroesophageal reflux disease)    History of vertigo    Hyperlipidemia     Hypertension    Hypothyroidism    PONV (postoperative nausea and vomiting)    Thyroid disease    hypothyroidism post Hashimoto's & partial thyroidectomy   Past Surgical History:  Procedure Laterality Date   BIOPSY  01/11/2022   Procedure: BIOPSY;  Surgeon: Rush Landmark Telford Nab., MD;  Location: [facility];  Service: Gastroenterology;;   CATARACT EXTRACTION W/ INTRAOCULAR LENS  IMPLANT, BILATERAL     COLONOSCOPY     diverticulosis   ESOPHAGOGASTRODUODENOSCOPY (EGD) WITH PROPOFOL N/A 01/11/2022   Procedure: ESOPHAGOGASTRODUODENOSCOPY (EGD) WITH PROPOFOL;  Surgeon: Irving Copas., MD;  Location: [facility];  Service: Gastroenterology;  Laterality: N/A;   GASTROSTOMY N/A 01/17/2022   Procedure: OPEN GASTROSTOMY PLACEMENT;  Surgeon: Erroll Luna, MD;  Location: [facility];  Service: General;  Laterality: N/A;   IR GASTROSTOMY TUBE MOD SED  01/15/2022   JOINT REPLACEMENT  1995 & 1999   THR bilaterally    lumbar disc repair  1989   Dr Arrie Aran   SAVORY DILATION N/A 01/11/2022   Procedure: SAVORY DILATION;  Surgeon: Irving Copas., MD;  Location: [facility];  Service: Gastroenterology;  Laterality: N/A;   SPINE SURGERY     cervical ; Dr Vertell Limber   THYROIDECTOMY, PARTIAL     benign nodule   TONSILLECTOMY AND ADENOIDECTOMY     TOTAL SHOULDER ARTHROPLASTY Left 02/27/2016   Procedure: LEFT REVERSE TOTAL SHOULDER ARTHROPLASTY;  Surgeon: Renette Butters, MD;  Location: [place];  Service: Orthopedics;  Laterality: Left;   vitreous detachment     Dr Claudean Kinds    Social History:  reports that she has never smoked. She has never used smokeless tobacco. She reports that she does not drink alcohol and does not use drugs.   Allergies  Allergen Reactions   Gabapentin Nausea And Vomiting    hallucinations hallucinations   Other Nausea And Vomiting    Strong pain medications cause n/v Strong pain medications cause n/v   Morphine Nausea And Vomiting    Nausea & vomiting Nausea  & vomiting    Family History  Problem Relation Age of Onset   Diabetes Mother    Hepatitis Mother        Hepatitis B,? etiology   Miscarriages / Stillbirths Mother    Stroke Mother 85   Heart disease Father        CHF   Cancer Paternal Aunt        GI cancers   Heart disease Paternal Aunt        CHF   Cancer Paternal Uncle        lung, GI   Heart disease Paternal Uncle        CHF     Prior to Admission medications   Medication Sig Start Date End Date Taking? Authorizing Provider  acetaminophen (TYLENOL) 500 MG tablet Take 500 mg by mouth every 6 (six) hours as needed for moderate pain.    [provider]  cephALEXin (KEFLEX) 250 MG/5ML suspension Place 10 mLs (500 mg total) into feeding tube 2 (two) times daily. 08/28/22   [name], MD  diclofenac Sodium (VOLTAREN) 1 % GEL Apply 2 g topically 4 (four) times daily as needed (Pain). Patient not taking: Reported on 08/28/2022 12/04/21   Gerlene Fee, NP  esomeprazole (NEXIUM) 40 MG capsule Take 40 MG place in feeding tube two times daily 03/11/22   [name], MD  FLUoxetine (PROZAC) 20 MG capsule Take 1 capsule (20 mg total) by mouth daily. 02/28/22   [name], MD  fluticasone (FLONASE) 50 MCG/ACT nasal spray Place 1 spray into both nostrils daily.    [provider]  levothyroxine (SYNTHROID) 112 MCG tablet Take 1 tablet (112 mcg total) by mouth daily. 07/02/22   [name], MD  levothyroxine (SYNTHROID) 125 MCG tablet Take 1 tablet (125 mcg total) by mouth daily before breakfast. 02/28/22   [name], MD  loratadine (CLARITIN) 10 MG tablet Take 10 mg by mouth daily.    [provider]  meloxicam (MOBIC) 7.5 MG tablet TAKE (1) TABLET BY MOUTH TWICE DAILY. 08/02/22   [name], MD  Multiple Vitamin (MULTIVITAMIN PO) Take 400 mg by mouth daily. Patient not taking: Reported on 08/28/2022    [provider]  nutrition supplement, JUVEN, (JUVEN)  PACK Place 1 packet into feeding tube 2 (two) times daily between meals. 01/23/22   Terrilee Croak, MD  Nutritional Supplements (FEEDING SUPPLEMENT, JEVITY 1.5 CAL/FIBER,) LIQD Place 50 mL/hr into feeding tube continuous. Dose to be titrated per RD at home. 01/23/22   Terrilee Croak, MD  Nutritional Supplements (FEEDING SUPPLEMENT, PROSOURCE TF,) liquid Place 45 mLs into feeding tube daily. 01/23/22   Dahal, Marlowe Aschoff, MD  ondansetron (ZOFRAN) 8 MG tablet Take 1 tablet (8 mg total) by mouth 3 (three) times daily before meals. Patient taking differently: Take 8 mg by mouth every 8 (eight) hours as needed for vomiting or nausea. 12/04/21   Gerlene Fee,  NP  ondansetron (ZOFRAN-ODT) 4 MG disintegrating tablet Take 1 tablet (4 mg total) by mouth every 8 (eight) hours as needed for nausea or vomiting. 08/25/22   Fransico Meadow, PA-C  ondansetron (ZOFRAN-ODT) 8 MG disintegrating tablet '8mg'$  ODT q4 hours prn nausea 08/26/22   Veryl Speak, MD  Polyvinyl Alcohol-Povidone (ARTIFICIAL TEARS) 5-6 MG/ML SOLN Place 2 drops into both eyes daily.    [provider]  predniSONE (DELTASONE) 10 MG tablet Take 10 mg by mouth daily. Patient not taking: Reported on 08/28/2022 04/29/22   [provider]  propranolol ER (INDERAL LA) 60 MG 24 hr capsule Take 60 mg by mouth daily. 12/31/21   [provider]  triamcinolone ointment (KENALOG) 0.1 % Apply 1 application. topically 2 (two) times daily. Patient not taking: Reported on 08/28/2022 04/29/22 04/29/23  [name], MD  UNABLE TO FIND TUBE FEEDS: 1/2 carton (118 ml) Jevity 1.5 QID with 60 ml free water before and 60 ml free water after each TF bolus and 45 ml Prosource TF (or equivalent) BID.    Maintain this regimen x4 days.    Then increase to 1 carton (237 ml) Jevity 1.5 QID with 60 ml free water before and 60 ml free water after each TF bolus and 45 ml Prosource TF (or equivalent) BID.    This goal regimen will provide 1500 kcal (86% kcal  need), 82 grams protein (96% protein need), and 1200 ml free water. 01/18/22   Rai, Vernelle Emerald, MD  vitamin B-12 (CYANOCOBALAMIN) 250 MCG tablet Take 250 mcg by mouth daily.    [provider]  Water For Irrigation, Sterile (FREE WATER) SOLN Place 100 mLs into feeding tube 6 (six) times daily. 01/23/22   Terrilee Croak, MD    Physical Exam: BP (!) 168/74 (BP Location: Right Arm)   Pulse 74   Temp 98 F (36.7 C) (Oral)   Resp 17   Ht '5\' 5"'$  (1.651 m)   Wt 58.1 kg   SpO2 96%   BMI 21.30 kg/m   General: 86 y.o. year-old female ill appearing, but in no acute distress.  Alert and oriented x3. HEENT: NCAT, EOMI, dry mucous membranes Neck: Supple, trachea medial Cardiovascular: Regular rate and rhythm with no rubs or gallops.  No thyromegaly or JVD noted.  No lower extremity edema. 2/4 pulses in all 4 extremities. Respiratory: Clear to auscultation with no wheezes or rales. Good inspiratory effort. Abdomen: G-tube placement site noted without any surrounding erythema or discharge.  Soft, nontender nondistended with normal bowel sounds x4 quadrants. Muskuloskeletal: No cyanosis, clubbing or edema noted bilaterally Neuro: CN II-XII intact, sensation, reflexes intact Skin: Erythema noted in bilateral gluteal region no ulcerative lesions noted or rashes Psychiatry: Judgement and insight appear normal. Mood is appropriate for condition and setting          Labs on Admission:  Basic Metabolic Panel: Recent Labs  Lab 09/02/22 1915 09/03/22 0525  NA 137 138  K 4.4 3.9  CL 101 106  CO2 29 26  GLUCOSE 130* 101*  BUN 14 14  CREATININE 0.48 0.50  CALCIUM 8.7* 8.4*  MG 1.9  --   PHOS  --  3.7   Liver Function Tests: Recent Labs  Lab 09/02/22 1915 09/03/22 0525  AST 22 19  ALT 21 17  ALKPHOS 94 77  BILITOT 0.7 0.5  PROT 6.6 5.9*  ALBUMIN 3.4* 2.9*   No results for input(s): "LIPASE", "AMYLASE" in the last 168 hours. No results  for input(s): "AMMONIA" in the last 168  hours. CBC: Recent Labs  Lab 09/02/22 1915 09/03/22 0525  WBC 9.0 5.4  NEUTROABS 8.1*  --   HGB 13.4 12.0  HCT 40.2 36.6  MCV 94.8 95.1  PLT 194 176   Cardiac Enzymes: Recent Labs  Lab 09/02/22 1915  CKTOTAL 24*    BNP (last 3 results) Recent Labs    01/07/22 1534 01/08/22 0805  BNP 113.5* 97.4    ProBNP (last 3 results) No results for input(s): "PROBNP" in the last 8760 hours.  CBG: No results for input(s): "GLUCAP" in the last 168 hours.  Radiological Exams on Admission: No results found.  EKG: I independently viewed the EKG done and my findings are as followed: Normal sinus rhythm at rate of 76 bpm  Assessment/Plan Present on Admission:  Cellulitis of buttock  Nausea  Principal Problem:   Cellulitis of buttock Active Problems:   Essential hypertension   Stricture and stenosis of esophagus   GERD (gastroesophageal reflux disease)   Nausea   Generalized weakness   Dehydration  Cellulitis of buttock Continue IV ceftriaxone  Nausea Continue Zofran  Generalized weakness Vitamin B12, folate, TSH will be checked  CBC was normal Continue vitamin B12 Continue fall precaution  Continue PT/OT eval and treat  Dehydration Continue IV hydration  Essential hypertension Continue propranolol  Mixed hyperlipidemia There was no anti-hyperlipidemia medication on patient's med rec We shall await updated med rec  GERD History of esophageal stricture Continue Protonix  Acquired hypothyroidism Continue Synthroid  CHF Stable.  Echocardiogram done on in February 2023 showed LVEF of 65%.  No RWMA.  Mild concentric LVH.  G1 DD Continue total input/output, daily weights and fluid restriction Continue Cardiac diet   G-tube feeding Resume home tube feed  DVT prophylaxis: Lovenox  Code Status: Full code  Consults: None  Family Communication: Daughter at bedside (all questions answered to satisfaction)  Severity of Illness: The appropriate  patient status for this patient is INPATIENT. Inpatient status is judged to be reasonable and necessary in order to provide the required intensity of service to ensure the patient's safety. The patient's presenting symptoms, physical exam findings, and initial radiographic and laboratory data in the context of their chronic comorbidities is felt to place them at high risk for further clinical deterioration. Furthermore, it is not anticipated that the patient will be medically stable for discharge from the hospital within 2 midnights of admission.   * I certify that at the point of admission it is my clinical judgment that the patient will require inpatient hospital care spanning beyond 2 midnights from the point of admission due to high intensity of service, high risk for further deterioration and high frequency of surveillance required.*  Author: Bernadette Hoit, DO 09/03/2022 8:01 AM  For on call review [email or web address].

## 2022-09-02 NOTE — ED Notes (Signed)
Ambulated pt with one assist. Pt took a few steps and started to feel sick and weak. Got pt back to bed and pt started to dry heave feeling she was going to throw up. Pt back in bed with puke bag at bedside. RN and MD will be notified.

## 2022-09-02 NOTE — Telephone Encounter (Signed)
08/28/22 visit, please advise should revisit or   Also concerns about medication needing to be refrigerated?

## 2022-09-02 NOTE — ED Triage Notes (Signed)
Patient from home with feeding tube.  In palliative care.  Pt seen in ER on 08/25/22 with swallowing and vomiting issues.  Patient returns to ER today with N/V.  Vomited x 1 time today.  Daughter reports cellulitis on bilateral posterior thighs and buttocks with stage 1 pressure ulcer between buttock in crack area. Feeding stopped at 10:00 am this morning and then she took her medication at that time she had a vomiting episode

## 2022-09-02 NOTE — ED Notes (Signed)
Pt taking Cephalexin for cellulitis, husband didn't realize the medication needed to be refrigerated and has concerns the medication left out of fridge may have caused the N/V

## 2022-09-02 NOTE — ED Notes (Signed)
Patient transported to CT 

## 2022-09-02 NOTE — Telephone Encounter (Signed)
Husband just called back stating that his wife throw up the medication. Husband want to know what he should. Husband states that they stay 45 mins away. Pt husband wants to know if someone could send another prescription for his wife to  [place], [place].

## 2022-09-02 NOTE — Telephone Encounter (Signed)
Pt husband called and stated she threw up the med, husband requesting new med

## 2022-09-02 NOTE — Telephone Encounter (Signed)
Called pt's daughter [name]. They are in ER now being evaluated. Her mom has been increasingly weak, recurrence of vomiting as per prior notes, and shaking episodes earlier today.  Still with cellulitis.  Hot and cold spells.  Once daughter received further information, she did take her mom to the ER.  Due to her symptoms and difficulty in taking care of her, they are trying to get her admitted.  I apologized for the late call but did want to check on her and apologized for the delay in calling her this afternoon as I was seeing patients.  I do agree with ER evaluation tonight based on symptoms above and that would have been my recommendation this afternoon as well.  I am glad they took her there to be evaluated. Thanked me for my call.  Will forward message to Dr. Birdie Riddle as Juluis Rainier.

## 2022-09-02 NOTE — Telephone Encounter (Signed)
I left the pt daughter [name] a VM to call us back to check on the pt . I explained that they pulled me to another office this morning and that Dr Birdie Riddle was out of the office today . I also stated that the MA who took the note she did forward it to Dr Birdie Riddle and Dr Carlota Raspberry . I apologized that no one has returned the phone call but Dr Carlota Raspberry has been seeing patients today and reminded her that Dr Birdie Riddle was out ., I also let her know I did see where she was transferred to the health team nurse line as well. I advised that I was sending message to Dr Birdie Riddle as well

## 2022-09-02 NOTE — Telephone Encounter (Signed)
Caller name: Anjolie Majer   On DPR? :yes/no: Yes  Call back number: [phone]  Provider they see: Birdie Riddle   Reason for call: Pt called stating that she not feeling any better. Pt states that she is still taking Keflex 250 mg/5 Ml but she not seeing any improvement. Pt want to know if she should she stop taking the medication or continue taking it. Pt states that she feels more weak now then her last appt on 08/29/22. Pt stated that she just notice that her medication supposed to be refrigerated.  Pt was taking medication at room temperature. Please advise.

## 2022-09-02 NOTE — ED Provider Notes (Signed)
[facility] EMERGENCY DEPARTMENT Provider Note   CSN: [id number] Arrival date & time: 09/02/22  1713     History  Chief Complaint  Patient presents with   Vomiting    Tasha Moss is a 86 y.o. female.  HPI     86 year old female with a past medical history of anxiety, arthritis, hypertension, hyperlipidemia, CHF, hypothyroidism s/p partial thyroidectomy, vertigo, esophageal stricture 2011 and GERD -status post G-tube placement and esophageal dilation comes in with chief complaint of nausea, worsening weakness and worsening rash to her buttock region.  Patient states that she started feeling unwell about 10 days ago. Patient had come to the ER on 9-17.  She had CT scan, basic blood work-up and the findings are reassuring.  She was discharged to see PCP.  PCP started on Keflex.  They do admit that the Keflex was not put in the refrigerator as directed by instructions on the medication, and that they have seen increased redness and excoriation of the skin at the gluteal region.  Patient was able to walk 2 days ago, but now profoundly weak and unable to walk.  Home Medications Prior to Admission medications   Medication Sig Start Date End Date Taking? Authorizing Provider  acetaminophen (TYLENOL) 500 MG tablet Take 500 mg by mouth every 6 (six) hours as needed for moderate pain.    [provider]  cephALEXin (KEFLEX) 250 MG/5ML suspension Place 10 mLs (500 mg total) into feeding tube 2 (two) times daily. 08/28/22   [name], MD  diclofenac Sodium (VOLTAREN) 1 % GEL Apply 2 g topically 4 (four) times daily as needed (Pain). Patient not taking: Reported on 08/28/2022 12/04/21   Gerlene Fee, NP  esomeprazole (NEXIUM) 40 MG capsule Take 40 MG place in feeding tube two times daily 03/11/22   [name], MD  FLUoxetine (PROZAC) 20 MG capsule Take 1 capsule (20 mg total) by mouth daily. 02/28/22   [name], MD  fluticasone (FLONASE) 50 MCG/ACT  nasal spray Place 1 spray into both nostrils daily.    [provider]  levothyroxine (SYNTHROID) 112 MCG tablet Take 1 tablet (112 mcg total) by mouth daily. 07/02/22   [name], MD  levothyroxine (SYNTHROID) 125 MCG tablet Take 1 tablet (125 mcg total) by mouth daily before breakfast. 02/28/22   [name], MD  loratadine (CLARITIN) 10 MG tablet Take 10 mg by mouth daily.    [provider]  meloxicam (MOBIC) 7.5 MG tablet TAKE (1) TABLET BY MOUTH TWICE DAILY. 08/02/22   [name], MD  Multiple Vitamin (MULTIVITAMIN PO) Take 400 mg by mouth daily. Patient not taking: Reported on 08/28/2022    [provider]  nutrition supplement, JUVEN, (JUVEN) PACK Place 1 packet into feeding tube 2 (two) times daily between meals. 01/23/22   Terrilee Croak, MD  Nutritional Supplements (FEEDING SUPPLEMENT, JEVITY 1.5 CAL/FIBER,) LIQD Place 50 mL/hr into feeding tube continuous. Dose to be titrated per RD at home. 01/23/22   Terrilee Croak, MD  Nutritional Supplements (FEEDING SUPPLEMENT, PROSOURCE TF,) liquid Place 45 mLs into feeding tube daily. 01/23/22   Dahal, Marlowe Aschoff, MD  ondansetron (ZOFRAN) 8 MG tablet Take 1 tablet (8 mg total) by mouth 3 (three) times daily before meals. Patient taking differently: Take 8 mg by mouth every 8 (eight) hours as needed for vomiting or nausea. 12/04/21   Gerlene Fee, NP  ondansetron (ZOFRAN-ODT) 4 MG disintegrating tablet Take 1 tablet (4 mg total)  by mouth every 8 (eight) hours as needed for nausea or vomiting. 08/25/22   Fransico Meadow, PA-C  ondansetron (ZOFRAN-ODT) 8 MG disintegrating tablet '8mg'$  ODT q4 hours prn nausea 08/26/22   Veryl Speak, MD  Polyvinyl Alcohol-Povidone (ARTIFICIAL TEARS) 5-6 MG/ML SOLN Place 2 drops into both eyes daily.    [provider]  predniSONE (DELTASONE) 10 MG tablet Take 10 mg by mouth daily. Patient not taking: Reported on 08/28/2022 04/29/22   [provider]   propranolol ER (INDERAL LA) 60 MG 24 hr capsule Take 60 mg by mouth daily. 12/31/21   [provider]  triamcinolone ointment (KENALOG) 0.1 % Apply 1 application. topically 2 (two) times daily. Patient not taking: Reported on 08/28/2022 04/29/22 04/29/23  [name], MD  UNABLE TO FIND TUBE FEEDS: 1/2 carton (118 ml) Jevity 1.5 QID with 60 ml free water before and 60 ml free water after each TF bolus and 45 ml Prosource TF (or equivalent) BID.    Maintain this regimen x4 days.    Then increase to 1 carton (237 ml) Jevity 1.5 QID with 60 ml free water before and 60 ml free water after each TF bolus and 45 ml Prosource TF (or equivalent) BID.    This goal regimen will provide 1500 kcal (86% kcal need), 82 grams protein (96% protein need), and 1200 ml free water. 01/18/22   Rai, Vernelle Emerald, MD  vitamin B-12 (CYANOCOBALAMIN) 250 MCG tablet Take 250 mcg by mouth daily.    [provider]  Water For Irrigation, Sterile (FREE WATER) SOLN Place 100 mLs into feeding tube 6 (six) times daily. 01/23/22   Terrilee Croak, MD      Allergies    Gabapentin, Other, and Morphine    Review of Systems   Review of Systems  All other systems reviewed and are negative.   Physical Exam Updated Vital Signs BP (!) 140/57   Pulse 79   Temp 98.3 F (36.8 C) (Oral)   Resp 15   Ht '5\' 5"'$  (1.651 m)   Wt 58.1 kg   SpO2 93%   BMI 21.30 kg/m  Physical Exam Vitals and nursing note reviewed.  Constitutional:      Appearance: She is well-developed.  HENT:     Head: Atraumatic.  Cardiovascular:     Rate and Rhythm: Normal rate.  Pulmonary:     Effort: Pulmonary effort is normal.  Musculoskeletal:     Cervical back: Normal range of motion and neck supple.  Skin:    General: Skin is warm and dry.     Findings: Erythema present.     Comments: Patient has erythematous skin in the gluteal region, including in the gluteal folds.  There is disclamation noted in the perianal region.  No loss  of integrity of the dermis.  Neurological:     Mental Status: She is alert and oriented to person, place, and time.     ED Results / Procedures / Treatments   Labs (all labs ordered are listed, but only abnormal results are displayed) Labs Reviewed  COMPREHENSIVE METABOLIC PANEL - Abnormal; Notable for the following components:      Result Value   Glucose, Bld 130 (*)    Calcium 8.7 (*)    Albumin 3.4 (*)    All other components within normal limits  CBC WITH DIFFERENTIAL/PLATELET - Abnormal; Notable for the following components:   Neutro Abs 8.1 (*)    Lymphs Abs 0.4 (*)  All other components within normal limits  CK - Abnormal; Notable for the following components:   Total CK 24 (*)    All other components within normal limits  RESP PANEL BY RT-PCR (FLU A&B, COVID) [id number]  URINALYSIS, ROUTINE W REFLEX MICROSCOPIC  MAGNESIUM  TROPONIN I (HIGH SENSITIVITY)  TROPONIN I (HIGH SENSITIVITY)    EKG EKG Interpretation  Date/Time:  Monday September 02 2022 21:19:19 EDT Ventricular Rate:  76 PR Interval:  168 QRS Duration: 93 QT Interval:  426 QTC Calculation: 479 R Axis:   -29 Text Interpretation: Sinus rhythm Inferior infarct, old Anteroseptal infarct, old No acute changes No significant change since last tracing Confirmed by Varney Biles [phone]) on 09/02/2022 10:15:05 PM  Radiology No results found.  Procedures Procedures    Medications Ordered in ED Medications  sodium chloride 0.9 % bolus 500 mL (has no administration in time range)  ondansetron (ZOFRAN) injection 4 mg (has no administration in time range)  iohexol (OMNIPAQUE) 300 MG/ML solution 100 mL (has no administration in time range)  haloperidol lactate (HALDOL) injection 5 mg (has no administration in time range)  sodium chloride 0.9 % bolus 500 mL (500 mLs Intravenous New Bag/Given 09/02/22 2027)  ondansetron (ZOFRAN) injection 4 mg (4 mg Intravenous Given 09/02/22 2112)    ED Course/ Medical  Decision Making/ A&P                           Medical Decision Making Amount and/or Complexity of Data Reviewed Labs: ordered.  Risk Prescription drug management.   This patient presents to the ED with chief complaint(s) of worsening weakness, worsening redness to the gluteal region, persistent nausea with pertinent past medical history of esophageal strictures and G-tube placement.The complaint involves an extensive differential diagnosis and also carries with it a high risk of complications and morbidity.    Patient has had a visit to the ER and also PCP.  PCP placed patient on antibiotics.  ED had started patient on nausea medication and had completed a CT scan which was reassuring.  The differential diagnosis includes dermatitis, early signs of sacral pressure ulcer, cellulitis, drug eruption, hypersensitivity reaction, impetigo, staph infection.  The initial plan is to get basic blood work-up.  We will also check COVID-19. Patient had a CT scan earlier this week, I do not think a repeat CAT scan will be needed. I will give patient home dose of ceftriaxone.  Questioning if I need to add MRSA coverage.   Additional history obtained: Additional history obtained from family and spouse Records reviewed  recent CT scan, which was reassuring  Independent labs interpretation:  The following labs were independently interpreted: CBC, metabolic profile are normal.  UA looks reassuring.   Treatment and Reassessment: Patient reassessed.  Daughter is at the bedside.  She is an Therapist, sports.  She states that patient has gotten worse over time.  Patient lives with her husband who is older and cannot help her with ADLs.  She has spine surgery and cannot do any lifting either.  Patient continues to have nausea and is unable to tolerate liquids through the G-tube.  We tried to ambulate the patient, but she was profoundly weak and was unable to take few steps.  She also has nausea, that has been  persistent.  Unsure why she is nauseous at this time. 1 thought I have had is that is she having anaphylaxis -but the symptoms have been present now for several days,  making it less likely.  Plan is to admit the patient to the hospital for observation status.  COVID-19 test pending.  Final Clinical Impression(s) / ED Diagnoses Final diagnoses:  Severe nausea  Cellulitis of buttock    Rx / DC Orders ED Discharge Orders     None         Varney Biles, MD 09/02/22 2303

## 2022-09-03 ENCOUNTER — Telehealth: Payer: Self-pay | Admitting: Family Medicine

## 2022-09-03 DIAGNOSIS — L03317 Cellulitis of buttock: Secondary | ICD-10-CM | POA: Diagnosis not present

## 2022-09-03 DIAGNOSIS — E860 Dehydration: Secondary | ICD-10-CM | POA: Diagnosis not present

## 2022-09-03 DIAGNOSIS — R531 Weakness: Secondary | ICD-10-CM

## 2022-09-03 DIAGNOSIS — I10 Essential (primary) hypertension: Secondary | ICD-10-CM | POA: Diagnosis not present

## 2022-09-03 DIAGNOSIS — Z931 Gastrostomy status: Secondary | ICD-10-CM

## 2022-09-03 DIAGNOSIS — R110 Nausea: Secondary | ICD-10-CM | POA: Diagnosis not present

## 2022-09-03 LAB — CBC
HCT: 36.6 % (ref 36.0–46.0)
Hemoglobin: 12 g/dL (ref 12.0–15.0)
MCH: 31.2 pg (ref 26.0–34.0)
MCHC: 32.8 g/dL (ref 30.0–36.0)
MCV: 95.1 fL (ref 80.0–100.0)
Platelets: 176 10*3/uL (ref 150–400)
RBC: 3.85 MIL/uL — ABNORMAL LOW (ref 3.87–5.11)
RDW: 14.2 % (ref 11.5–15.5)
WBC: 5.4 10*3/uL (ref 4.0–10.5)
nRBC: 0 % (ref 0.0–0.2)

## 2022-09-03 LAB — COMPREHENSIVE METABOLIC PANEL
ALT: 17 U/L (ref 0–44)
AST: 19 U/L (ref 15–41)
Albumin: 2.9 g/dL — ABNORMAL LOW (ref 3.5–5.0)
Alkaline Phosphatase: 77 U/L (ref 38–126)
Anion gap: 6 (ref 5–15)
BUN: 14 mg/dL (ref 8–23)
CO2: 26 mmol/L (ref 22–32)
Calcium: 8.4 mg/dL — ABNORMAL LOW (ref 8.9–10.3)
Chloride: 106 mmol/L (ref 98–111)
Creatinine, Ser: 0.5 mg/dL (ref 0.44–1.00)
GFR, Estimated: >60 mL/min (ref >60)
Glucose, Bld: 101 mg/dL — ABNORMAL HIGH (ref 70–99)
Potassium: 3.9 mmol/L (ref 3.5–5.1)
Sodium: 138 mmol/L (ref 135–145)
Total Bilirubin: 0.5 mg/dL (ref 0.3–1.2)
Total Protein: 5.9 g/dL — ABNORMAL LOW (ref 6.5–8.1)

## 2022-09-03 LAB — APTT: aPTT: 26 seconds (ref 24–36)

## 2022-09-03 LAB — VITAMIN B12: Vitamin B-12: 2218 pg/mL — ABNORMAL HIGH (ref 180–914)

## 2022-09-03 LAB — FOLATE: Folate: 30.2 ng/mL (ref >5.9)

## 2022-09-03 LAB — PHOSPHORUS: Phosphorus: 3.7 mg/dL (ref 2.5–4.6)

## 2022-09-03 MED ORDER — ACETAMINOPHEN 650 MG RE SUPP: 650.0000 mg | Freq: Four times a day (QID) | RECTAL | Status: DC | PRN

## 2022-09-03 MED ORDER — PANTOPRAZOLE SODIUM 40 MG PO TBEC
40.0000 mg | DELAYED_RELEASE_TABLET | Freq: Every day | ORAL | Status: DC
  Administered 2022-09-03: 40 mg via ORAL
  Filled 2022-09-03: qty 1

## 2022-09-03 MED ORDER — ACETAMINOPHEN 325 MG PO TABS
650.0000 mg | ORAL_TABLET | Freq: Four times a day (QID) | ORAL | Status: DC | PRN
  Administered 2022-09-03: 325 mg via ORAL
  Administered 2022-09-05 (×2): 650 mg via ORAL
  Filled 2022-09-03 (×3): qty 2

## 2022-09-03 MED ORDER — SODIUM CHLORIDE 0.9 % IV SOLN: INTRAVENOUS | Status: AC

## 2022-09-03 MED ORDER — SODIUM CHLORIDE 0.9 % IV SOLN
1.0000 g | INTRAVENOUS | Status: DC
  Administered 2022-09-03 – 2022-09-06 (×4): 1 g via INTRAVENOUS
  Filled 2022-09-03 (×4): qty 10

## 2022-09-03 MED ORDER — ORAL CARE MOUTH RINSE
15.0000 mL | OROMUCOSAL | Status: DC | PRN
  Administered 2022-09-03: 15 mL via OROMUCOSAL

## 2022-09-03 MED ORDER — JUVEN PO PACK
1.0000 | PACK | Freq: Two times a day (BID) | ORAL | Status: DC
  Administered 2022-09-03 – 2022-09-06 (×7): 1
  Filled 2022-09-03 (×7): qty 1

## 2022-09-03 MED ORDER — VITAMIN B-12 100 MCG PO TABS
250.0000 ug | ORAL_TABLET | Freq: Every day | ORAL | Status: DC
  Administered 2022-09-03: 250 ug via ORAL
  Filled 2022-09-03 (×4): qty 3

## 2022-09-03 MED ORDER — ONDANSETRON HCL 4 MG/2ML IJ SOLN
4.0000 mg | Freq: Four times a day (QID) | INTRAMUSCULAR | Status: DC | PRN
  Administered 2022-09-03 – 2022-09-06 (×4): 4 mg via INTRAVENOUS
  Filled 2022-09-03 (×4): qty 2

## 2022-09-03 MED ORDER — LEVOTHYROXINE SODIUM 112 MCG PO TABS
112.0000 ug | ORAL_TABLET | Freq: Every day | ORAL | Status: DC
  Administered 2022-09-03 – 2022-09-06 (×4): 112 ug via ORAL
  Filled 2022-09-03 (×4): qty 1

## 2022-09-03 MED ORDER — PROSOURCE TF20 ENFIT COMPATIBL EN LIQD
45.0000 mL | Freq: Every day | ENTERAL | Status: DC
  Administered 2022-09-03 – 2022-09-04 (×2): 45 mL
  Filled 2022-09-03 (×2): qty 60

## 2022-09-03 MED ORDER — ONDANSETRON HCL 4 MG PO TABS: 4.0000 mg | ORAL_TABLET | Freq: Four times a day (QID) | ORAL | Status: DC | PRN

## 2022-09-03 MED ORDER — PROPRANOLOL HCL ER 60 MG PO CP24
60.0000 mg | ORAL_CAPSULE | Freq: Every day | ORAL | Status: DC
  Administered 2022-09-03 – 2022-09-06 (×4): 60 mg via ORAL
  Filled 2022-09-03 (×7): qty 1

## 2022-09-03 MED ORDER — ENOXAPARIN SODIUM 40 MG/0.4ML IJ SOSY
40.0000 mg | PREFILLED_SYRINGE | INTRAMUSCULAR | Status: DC
  Administered 2022-09-03 – 2022-09-06 (×4): 40 mg via SUBCUTANEOUS
  Filled 2022-09-03 (×4): qty 0.4

## 2022-09-03 MED ORDER — JEVITY 1.5 CAL/FIBER PO LIQD
40.0000 mL/h | ORAL | Status: DC
  Administered 2022-09-03 – 2022-09-04 (×2): 50 mL/h

## 2022-09-03 MED ORDER — ORAL CARE MOUTH RINSE
15.0000 mL | OROMUCOSAL | Status: DC
  Administered 2022-09-03 – 2022-09-06 (×11): 15 mL via OROMUCOSAL

## 2022-09-03 NOTE — ED Notes (Signed)
Med Nec. Completed in error.

## 2022-09-03 NOTE — Telephone Encounter (Signed)
I spoke to patient's husband as well as Tasha Moss (patient's daughter). Tasha Moss was just moved into a room this morning, she is still very weak and they are trying to help with this. She thanked me for calling and expressed her concern that this was the second week in a row that her mom didn't receive a call back when she should have. I reassured her that I hear her and understand and she is correct! I let her know that I did address the first incident and will address this one. I informed her that from the messages I read through, they were being addressed and sent to the provider but I can see no one called to update the patient that they were working on it. Also, expressed that someone should have indeed made the physician aware of the message to have it taken care of expeditiously. I assured her that I will address this and that it is also going to be addressed with the whole clinic at tomorrow's staff meeting. I let her know that I would call her back this afternoon to check on them. Patient expressed understanding.

## 2022-09-03 NOTE — Progress Notes (Signed)
PROGRESS NOTE    Tasha Moss  UQJ:[id number] DOB: January 30, 1936 DOA: 09/02/2022 PCP: [name], MD   Brief Narrative:  HPI per Dr. Bernadette Hoit on 09/02/22 Tasha Moss is a 86 y.o. female with medical history significant of hypertension, hyperlipidemia, CHF, hypothyroidism status post partial thyroidectomy, GERD, esophageal stricture (2011) who presents to the emergency department accompanied by daughter due to nausea, worsening weakness and worsening rash in buttock area.  She complained of simvastatin about 10 days ago and she presented to the ED on 9/17 during which blood work-up and CT scan done showed no acute pathology, however, she was diagnosed to have cellulitis of the buttock and was discharged with Keflex.  Patient's weakness worsened to the extent that she was unable to ambulate today (she was able to walk as of 2 days ago), so it was decided for her to go to the ED for further evaluation and management.     ED Course:  In the emergency department, BP was 140/57 but other vital signs were within normal range.  Work-up in the ED showed normal CBC, normal BMP except for blood glucose of 130, total CK24, magnesium 1.9, troponin x2 was negative, urinalysis was normal.  Influenza A, B, SARS coronavirus 2 was negative. Patient was treated with IV hydration, Zofran was given and she was started on IV ceftriaxone for cellulitis.  Hospitalist was asked to admit patient for further evaluation and management.  **Interim History Tube feedings restarted at a slower rate given her Nausea and Vomiting.  Continues to feel bad   Assessment and Plan: No notes have been filed under this hospital service. Service: Hospitalist   Cellulitis/Rash of Buttock posterior bilateral thighs with stage I pressure ulcer on the buttock crack area -Continue IV ceftriaxone 1 g every 24 hours for her cellulitis may need to escalate antibiotics if not improving -Patient noticed increased redness and  excoriation of the skin of the gluteal region -We will continue with supportive care -Did not appear septic appearing on admission and CT scan of the abdomen pelvis was done for her nausea vomiting -Continue with supportive care and she has been afebrile and has no white count now.  WBC went from 9.0 is now 5.4 -WOC to evaluate  -She was taking Keflex in the outpatient setting -Patient had this earlier this year when she was hospitalized for similar instance.  PCP felt that is consistent with contact dermatitis from the hospital bed pad however she was initially on antibiotics -If not improving may need to consult ID or broaden antibiotics   Nausea and Vomiting -Continue supportive care with ondansetron 4 mg p.o./IV every 6 as needed nausea and continue IV fluid hydration as below -CT Scan of the Abdomen and Pelvis done on 08/25/2022 and showed "Bilateral nonobstructing renal calculi. Diverticulosis without diverticulitis. Stable nodularity of the left adrenal gland measuring less than 1 cm. No further follow-up is recommended."   Generalized weakness and physical deconditioning -Vitamin B12 was 2218, folate level was 30.2, TSH will be checked  -She was able to walk a few days ago but is now profoundly weak and unable to walk; normally lives at home with her husband who cannot really help with her ADLs and does have a history of spine surgery and cannot do any lifting -CBC was normal -Continue with IV fluid hydration with normal saline at 75 MLS per hour for 14 hours -Continue vitamin B12 -Continue fall precaution  -Continue PT/OT eval and treat   Dehydration -  Continue IV hydration with normal saline at 75 MLS per hour for 14 hours   Essential Hypertension -Continue Propranolol ER 60 mg p.o. daily -Continue to monitor blood pressures per protocol -Last blood pressure reading was 139/78   Mixed Hyperlipidemia -There was no anti-hyperlipidemia medication on patient's med rec -We shall  await updated med rec still does not have a statin listed   GERD/GI Prophylaxis History of esophageal stricture -Continue PPI with pantoprazole 40 mg daily   Acquired Hypothyroidism -Check TSH in the AM  -Continue Levothyroxine 112 mcg p.o. daily   Chronic Diastolic CHF -Stable.  Echocardiogram done on in February 2023 showed LVEF of 65%.  No RWMA.  Mild concentric LVH.  G1 DD -Continue total input/output, daily weights and fluid restriction -Continue Cardiac diet               G-tube feeding/PEG Tube -Resume tube feedings at a slower rate to see if she can tolerate this given her intractable nausea vomiting -Continue feeding supplements with Jevity 1.5 Cal per fiber liquid 50 MLS per hour per tube continuous as well as feeding supplements with Prosource tube feeding 45 MLS per tube daily -Also continue nutritional supplements with Juven powder packets 1 packet per tube twice daily  DVT prophylaxis: enoxaparin (LOVENOX) injection 40 mg Start: 09/03/22 0800 SCDs Start: 09/03/22 0243    Code Status: Full Code Family Communication: Discussed with husband at bedside  Disposition Plan:  Level of care: Med-Surg Status is: Inpatient Remains inpatient appropriate because: Continues to not feel well will need clinical improvement and tolerance of her tube feeds prior to safe discharge disposition   Consultants:  None  Procedures:  As delineated as above  Antimicrobials:  Anti-infectives (From admission, onward)    Start     Dose/Rate Route Frequency Ordered Stop   09/03/22 0045  cefTRIAXone (ROCEPHIN) 1 g in sodium chloride 0.9 % 100 mL IVPB        1 g 200 mL/hr over 30 Minutes Intravenous Every 24 hours 09/03/22 0030 09/10/22 0029   09/02/22 2315  doxycycline (VIBRA-TABS) tablet 100 mg  Status:  Discontinued        100 mg Oral  Once 09/02/22 2303 09/03/22 0030       Subjective: Seen and examined at bedside and she states that she is not feeling as well.  Remained a little  nauseous and is extremely hard of hearing.  No chest pain or shortness of breath.  Has some abdominal discomfort.  Denies any lightheadedness or dizziness.  No other concerns or complaints at this time  Objective: Vitals:   09/03/22 0500 09/03/22 0600 09/03/22 0724 09/03/22 0738  BP: 122/67 136/67  (!) 168/74  Pulse: 76 72  74  Resp: '20 20  17  '$ Temp:   97.9 F (36.6 C) 98 F (36.7 C)  TempSrc:   Oral Oral  SpO2: 90% 96%  96%  Weight:      Height:       No intake or output data in the 24 hours ending 09/03/22 0920 Filed Weights   09/02/22 1746  Weight: 58.1 kg   Examination: Physical Exam:  Constitutional: WN/WD thin chronically ill-appearing elderly Caucasian female who appears uncomfortable and fatigued Respiratory: Diminished to auscultation bilaterally, no wheezing, rales, rhonchi or crackles. Normal respiratory effort and patient is not tachypenic. No accessory muscle use.  Unlabored breathing Cardiovascular: RRR, no murmurs / rubs / gallops. S1 and S2 auscultated. No extremity edema.  Abdomen: Soft, non-tender, non-distended. PEG  in place. Bowel sounds positive.  GU: Deferred. Musculoskeletal: No clubbing / cyanosis of digits/nails. No joint deformity upper and lower extremities.  Neurologic: CN 2-12 grossly intact with no focal deficits she is extremely hard of hearing without her hearing aids. Romberg sign and cerebellar reflexes not assessed.  Psychiatric: Normal judgment and insight.  Slightly anxious appearing mood  Data Reviewed: I have personally reviewed following labs and imaging studies  CBC: Recent Labs  Lab 09/02/22 1915 09/03/22 0525  WBC 9.0 5.4  NEUTROABS 8.1*  --   HGB 13.4 12.0  HCT 40.2 36.6  MCV 94.8 95.1  PLT 194 400   Basic Metabolic Panel: Recent Labs  Lab 09/02/22 1915 09/03/22 0525  NA 137 138  K 4.4 3.9  CL 101 106  CO2 29 26  GLUCOSE 130* 101*  BUN 14 14  CREATININE 0.48 0.50  CALCIUM 8.7* 8.4*  MG 1.9  --   PHOS  --  3.7    GFR: Estimated Creatinine Clearance: 47.1 mL/min (by C-G formula based on SCr of 0.5 mg/dL). Liver Function Tests: Recent Labs  Lab 09/02/22 1915 09/03/22 0525  AST 22 19  ALT 21 17  ALKPHOS 94 77  BILITOT 0.7 0.5  PROT 6.6 5.9*  ALBUMIN 3.4* 2.9*   No results for input(s): "LIPASE", "AMYLASE" in the last 168 hours. No results for input(s): "AMMONIA" in the last 168 hours. Coagulation Profile: No results for input(s): "INR", "PROTIME" in the last 168 hours. Cardiac Enzymes: Recent Labs  Lab 09/02/22 1915  CKTOTAL 24*   BNP (last 3 results) No results for input(s): "PROBNP" in the last 8760 hours. HbA1C: No results for input(s): "HGBA1C" in the last 72 hours. CBG: No results for input(s): "GLUCAP" in the last 168 hours. Lipid Profile: No results for input(s): "CHOL", "HDL", "LDLCALC", "TRIG", "CHOLHDL", "LDLDIRECT" in the last 72 hours. Thyroid Function Tests: No results for input(s): "TSH", "T4TOTAL", "FREET4", "T3FREE", "THYROIDAB" in the last 72 hours. Anemia Panel: Recent Labs    09/03/22 0525  VITAMINB12 2,218*  FOLATE 30.2   Sepsis Labs: No results for input(s): "PROCALCITON", "LATICACIDVEN" in the last 168 hours.  Recent Results (from the past 240 hour(s))  Resp Panel by RT-PCR (Flu A&B, Covid) Anterior Nasal Swab     Status: None   Collection Time: 09/02/22 10:45 PM   Specimen: Anterior Nasal Swab  Result Value Ref Range Status   SARS Coronavirus 2 by RT PCR NEGATIVE NEGATIVE Final    Comment: (NOTE) SARS-CoV-2 target nucleic acids are NOT DETECTED.  The SARS-CoV-2 RNA is generally detectable in upper respiratory specimens during the acute phase of infection. The lowest concentration of SARS-CoV-2 viral copies this assay can detect is 138 copies/mL. A negative result does not preclude SARS-Cov-2 infection and should not be used as the sole basis for treatment or other patient management decisions. A negative result may occur with  improper  specimen collection/handling, submission of specimen other than nasopharyngeal swab, presence of viral mutation(s) within the areas targeted by this assay, and inadequate number of viral copies(<138 copies/mL). A negative result must be combined with clinical observations, patient history, and epidemiological information. The expected result is Negative.  Fact Sheet for Patients:  EntrepreneurPulse.com.au  Fact Sheet for Healthcare Providers:  IncredibleEmployment.be  This test is no t yet approved or cleared by the [place] FDA and  has been authorized for detection and/or diagnosis of SARS-CoV-2 by FDA under an Emergency Use Authorization (EUA). This EUA will remain  in effect (  meaning this test can be used) for the duration of the COVID-19 declaration under Section 564(b)(1) of the Act, 21 U.S.C.section 360bbb-3(b)(1), unless the authorization is terminated  or revoked sooner.       Influenza A by PCR NEGATIVE NEGATIVE Final   Influenza B by PCR NEGATIVE NEGATIVE Final    Comment: (NOTE) The Xpert Xpress SARS-CoV-2/FLU/RSV plus assay is intended as an aid in the diagnosis of influenza from Nasopharyngeal swab specimens and should not be used as a sole basis for treatment. Nasal washings and aspirates are unacceptable for Xpert Xpress SARS-CoV-2/FLU/RSV testing.  Fact Sheet for Patients: EntrepreneurPulse.com.au  Fact Sheet for Healthcare Providers: IncredibleEmployment.be  This test is not yet approved or cleared by the [place] FDA and has been authorized for detection and/or diagnosis of SARS-CoV-2 by FDA under an Emergency Use Authorization (EUA). This EUA will remain in effect (meaning this test can be used) for the duration of the COVID-19 declaration under Section 564(b)(1) of the Act, 21 U.S.C. section 360bbb-3(b)(1), unless the authorization is terminated or revoked.  Performed at  [facility], [address], [place], [place] [address]     Radiology Studies: No results found.  Scheduled Meds:  enoxaparin (LOVENOX) injection  40 mg Subcutaneous Q24H   feeding supplement (PROSource TF20)  45 mL Per Tube Daily   levothyroxine  112 mcg Oral Daily   nutrition supplement (JUVEN)  1 packet Per Tube BID BM   pantoprazole  40 mg Oral Daily   propranolol ER  60 mg Oral Daily   vitamin B-12  250 mcg Oral Daily   Continuous Infusions:  sodium chloride 75 mL/hr at 09/03/22 0747   cefTRIAXone (ROCEPHIN)  IV Stopped (09/03/22 0240)   feeding supplement (JEVITY 1.5 CAL/FIBER)      LOS: 1 day   Raiford Noble, DO [facility] Available via Epic secure chat 7am-7pm After these hours, please refer to coverage provider listed on [email or web address] 09/03/2022, 9:20 AM

## 2022-09-03 NOTE — Telephone Encounter (Signed)
[facility] TELEPHONE ADVICE RECORD AccessNurse Patient Name: Tasha Moss LOR Gender: Female DOB: Jan 21, 1936 Age: 86 Y 55 M 19 D Return Phone Number: [phone] (Primary), [phone] (Secondary) Address: [place]/ [place]/ [place] [address] [place] Client Site [place] Provider Dimple Nanas- MD Contact Type Call Who Is Calling Patient / Member / Family / Caregiver Call Type Triage / Clinical Caller Name Korianna Washer Relationship To Patient Daughter Return Phone Number [phone] (Primary) Chief Complaint Vomiting Reason for Call Symptomatic / Request for Health Information Initial Comment Caller states she has been sick and throwing up. Her provider had given her some medication, Keflex. Her dad had been giving her medication that was supposed to be refridgerated but did not know. It was given twice a day since Wednesday. She has been throwing up since. They want to talk to the nurse to see if that made a difference with it not be refridgerated. They also need a refill. She also has cellulitis on her buttocks and thighs [place] Not Listed [facility] ER Translation No Nurse Assessment Nurse: Janene Madeira, RN, Wells Guiles Date/Time (Eastern Time): 09/02/2022 3:53:17 PM Confirm and document reason for call. If symptomatic, describe symptoms. ---Caller states her mother was throwing up and seen at the ER last sunday. and was NG'E zofran. pt was seen at the MD's office and was rx'd liquid cephalexin 250 mg for cellulitis and it was not refrigerated. daughter wants a new bottle. [phone]. today has been vomiting and increased weakness, shakiness, feeling hot Does the patient have any new or worsening symptoms? ---Yes Will a triage be completed? ---Yes Related visit to physician within the last 2 weeks?  ---Yes Does the PT have any chronic conditions? (i.e. diabetes, asthma, this includes High risk factors for pregnancy, etc.) ---Yes List chronic conditions. ---GERD, arthritis, thyroid Is this a behavioral health or substance abuse call? ---No PLEASE NOTE: All timestamps contained within this report are represented as [place] Standard Time. CONFIDENTIALTY NOTICE: This fax transmission is intended only for the addressee. It contains information that is legally privileged, confidential or otherwise protected from use or disclosure. If you are not the intended recipient, you are strictly prohibited from reviewing, disclosing, copying using or disseminating any of this information or taking any action in reliance on or regarding this information. If you have received this fax in error, please notify us immediately by telephone so that we can arrange for its return to [place]. Phone: [phone], Toll-Free: [phone], Fax: [phone] Page: 2 of 2 Call Id: [id number] Nurse Assessment Guidelines Guideline Title Affirmed Question Affirmed Notes Nurse Date/Time Eilene Ghazi Time) Vomiting [1] MODERATE vomiting (e.g., 3 - 5 times/day) AND [2] age > 42 years Janene Madeira, Ramond Dial 09/02/2022 4:02:54 PM Disp. Time Eilene Ghazi Time) Disposition Final User 09/02/2022 4:09:04 PM Go to ED Now (or PCP triage) Yes Janene Madeira, RN, Wells Guiles Final Disposition 09/02/2022 4:09:04 PM Go to ED Now (or PCP triage) Yes Janene Madeira, RN, Romualdo Bolk Disagree/Comply Comply Caller Understands Yes PreDisposition InappropriateToAsk Care Advice Given Per Guideline GO TO ED NOW (OR PCP TRIAGE): * IF NO PCP (PRIMARY CARE PROVIDER) SECOND-LEVEL TRIAGE: You need to be seen within the next hour. Go to the [facility] at _____________ [place] as soon as you can. BRING A BUCKET IN CASE OF VOMITING: * You may wish to bring a bucket, pan, or plastic bag with you in case there is more vomiting  during the drive. BRING MEDICINES: * Please  bring a list of your current medicines when you go to see the doctor. * It is also a good idea to bring the pill bottles too. This will help the doctor to make certain you are taking the right medicines and the right dose. CARE ADVICE per Vomiting (Adult) guideline.

## 2022-09-04 DIAGNOSIS — L03317 Cellulitis of buttock: Secondary | ICD-10-CM | POA: Diagnosis not present

## 2022-09-04 LAB — CBC WITH DIFFERENTIAL/PLATELET
Abs Immature Granulocytes: 0.02 10*3/uL (ref 0.00–0.07)
Basophils Absolute: 0 10*3/uL (ref 0.0–0.1)
Basophils Relative: 1 %
Eosinophils Absolute: 0.3 10*3/uL (ref 0.0–0.5)
Eosinophils Relative: 6 %
HCT: 38.5 % (ref 36.0–46.0)
Hemoglobin: 12.4 g/dL (ref 12.0–15.0)
Immature Granulocytes: 0 %
Lymphocytes Relative: 14 %
Lymphs Abs: 0.8 10*3/uL (ref 0.7–4.0)
MCH: 31.1 pg (ref 26.0–34.0)
MCHC: 32.2 g/dL (ref 30.0–36.0)
MCV: 96.5 fL (ref 80.0–100.0)
Monocytes Absolute: 0.5 10*3/uL (ref 0.1–1.0)
Monocytes Relative: 10 %
Neutro Abs: 3.8 10*3/uL (ref 1.7–7.7)
Neutrophils Relative %: 69 %
Platelets: 189 10*3/uL (ref 150–400)
RBC: 3.99 MIL/uL (ref 3.87–5.11)
RDW: 14.5 % (ref 11.5–15.5)
WBC: 5.4 10*3/uL (ref 4.0–10.5)
nRBC: 0 % (ref 0.0–0.2)

## 2022-09-04 LAB — COMPREHENSIVE METABOLIC PANEL
ALT: 18 U/L (ref 0–44)
AST: 18 U/L (ref 15–41)
Albumin: 2.9 g/dL — ABNORMAL LOW (ref 3.5–5.0)
Alkaline Phosphatase: 84 U/L (ref 38–126)
Anion gap: 3 — ABNORMAL LOW (ref 5–15)
BUN: 24 mg/dL — ABNORMAL HIGH (ref 8–23)
CO2: 30 mmol/L (ref 22–32)
Calcium: 8.1 mg/dL — ABNORMAL LOW (ref 8.9–10.3)
Chloride: 106 mmol/L (ref 98–111)
Creatinine, Ser: 0.45 mg/dL (ref 0.44–1.00)
GFR, Estimated: >60 mL/min (ref >60)
Glucose, Bld: 97 mg/dL (ref 70–99)
Potassium: 4.3 mmol/L (ref 3.5–5.1)
Sodium: 139 mmol/L (ref 135–145)
Total Bilirubin: 0.6 mg/dL (ref 0.3–1.2)
Total Protein: 5.9 g/dL — ABNORMAL LOW (ref 6.5–8.1)

## 2022-09-04 LAB — PHOSPHORUS: Phosphorus: 3.1 mg/dL (ref 2.5–4.6)

## 2022-09-04 LAB — MAGNESIUM: Magnesium: 1.8 mg/dL (ref 1.7–2.4)

## 2022-09-04 MED ORDER — PANTOPRAZOLE 2 MG/ML SUSPENSION
40.0000 mg | Freq: Every day | ORAL | Status: DC
  Administered 2022-09-04 – 2022-09-06 (×3): 40 mg
  Filled 2022-09-04 (×3): qty 20

## 2022-09-04 MED ORDER — PROSOURCE TF20 ENFIT COMPATIBL EN LIQD
60.0000 mL | Freq: Every day | ENTERAL | Status: DC
  Administered 2022-09-05 – 2022-09-06 (×2): 60 mL
  Filled 2022-09-04 (×2): qty 60

## 2022-09-04 NOTE — Telephone Encounter (Signed)
Spoke to [place] today to check on Mrs. Tasha Moss. She states that she seems to be doing a little better, she did get up to walk today and they are keeping her on IV antibx. If she has better strength tomorrow they will likely send her home with PT. [name] thanked me for calling to check on her.

## 2022-09-04 NOTE — Progress Notes (Signed)
Pharmacy contacted regarding pt Propranolol ER and Protonix as pt requires meds to be given via PEG tube.  Pharmacy able to change protonix , provider will need to change propranolol order.

## 2022-09-04 NOTE — Consult Note (Signed)
[facility] Nurse wound consult note Consultation was completed by review of records, images and assistance from the bedside nurse/clinical staff.  Reason for Consult: sacrum Wound type: Stage 2 Pressure Injury Pressure Injury POA: Yes Measurement: 2cm x 2cm x 0.1cm (per nursing flow sheet) and bedside nursing collaboration  Wound bed: clean, pink, moist Drainage (amount, consistency, odor) none Periwound: intact  Dressing procedure/placement/frequency:  Silicone foam dressings to the sacral wound change every 3 days. Assess under dressings each shift for any acute changes in the wounds.   Turn and reposition at least every two hours    Re consult if needed, will not follow at this time. Thanks  Xylina Rhoads [employer], RN,CWOCN, CNS, [place] [phone])

## 2022-09-04 NOTE — Plan of Care (Signed)
  Problem: Acute Rehab OT Goals (only OT should resolve) Goal: Pt. Will Perform Grooming Flowsheets (Taken 09/04/2022 1027) Pt Will Perform Grooming:  with modified independence  standing Goal: Pt. Will Transfer To Toilet Flowsheets (Taken 09/04/2022 1027) Pt Will Transfer to Toilet:  with modified independence  ambulating Goal: Pt/Caregiver Will Perform Home Exercise Program Flowsheets (Taken 09/04/2022 1027) Pt/caregiver will Perform Home Exercise Program:  Increased strength  Both right and left upper extremity  Independently  Shiva Karis OT, MOT

## 2022-09-04 NOTE — Plan of Care (Signed)
  Problem: Acute Rehab PT Goals(only PT should resolve) Goal: Pt Will Go Supine/Side To Sit Outcome: Progressing Flowsheets (Taken 09/04/2022 1359) Pt will go Supine/Side to Sit: with modified independence Goal: Patient Will Transfer Sit To/From Stand Outcome: Progressing Flowsheets (Taken 09/04/2022 1359) Patient will transfer sit to/from stand:  with modified independence  with supervision Goal: Pt Will Transfer Bed To Chair/Chair To Bed Outcome: Progressing Flowsheets (Taken 09/04/2022 1359) Pt will Transfer Bed to Chair/Chair to Bed:  with modified independence  with supervision Goal: Pt Will Ambulate Outcome: Progressing Flowsheets (Taken 09/04/2022 1359) Pt will Ambulate:  100 feet  with rolling walker  with supervision   Zigmund Gottron, SPT

## 2022-09-04 NOTE — Evaluation (Signed)
Occupational Therapy Evaluation Patient Details Name: Tasha Moss MRN: [id number] DOB: May 24, 1936 Today's Date: 09/04/2022   History of Present Illness Tasha Moss is a 86 y.o. female with medical history significant of hypertension, hyperlipidemia, CHF, hypothyroidism status post partial thyroidectomy, GERD, esophageal stricture (2011) who presents to the emergency department accompanied by daughter due to nausea, worsening weakness and worsening rash in buttock area.  She complained of simvastatin about 10 days ago and she presented to the ED on 9/17 during which blood work-up and CT scan done showed no acute pathology, however, she was diagnosed to have cellulitis of the buttock and was discharged with Keflex.  Patient's weakness worsened to the extent that she was unable to ambulate today (she was able to walk as of 2 days ago), so it was decided for her to go to the ED for further evaluation and management. (Per DO)   Clinical Impression   Pt agreeable to OT and PT co-evaluation. Pt assisted by husband for lower body ADL's at baseline. Today pt required min G A for ambulation in hall and room. Unsteady in standing and generally weak. Husband is present at home for assistance. Recommend home health OT for safety assessment and general strengthening and endurance work. Pt left in chair with call bell within reach. Pt will benefit from continued OT in the hospital and recommended venue below to increase strength, balance, and endurance for safe ADL's.         Recommendations for follow up therapy are one component of a multi-disciplinary discharge planning process, led by the attending physician.  Recommendations may be updated based on patient status, additional functional criteria and insurance authorization.   Follow Up Recommendations  Home health OT    Assistance Recommended at Discharge Intermittent Supervision/Assistance  Patient can return home with the following A lot of help with  walking and/or transfers;A lot of help with bathing/dressing/bathroom;Assistance with cooking/housework;Assist for transportation;Help with stairs or ramp for entrance    Functional Status Assessment  Patient has had a recent decline in their functional status and demonstrates the ability to make significant improvements in function in a reasonable and predictable amount of time.  Equipment Recommendations  None recommended by OT    Recommendations for Other Services       Precautions / Restrictions Precautions Precautions: Fall Restrictions Weight Bearing Restrictions: No      Mobility Bed Mobility Overal bed mobility: Modified Independent             General bed mobility comments: HOB elevated    Transfers Overall transfer level: Needs assistance Equipment used: Rolling walker (2 wheels) Transfers: Sit to/from Stand, Bed to chair/wheelchair/BSC Sit to Stand: Supervision, Min guard     Step pivot transfers: Min guard     General transfer comment: Labored movement with RW.      Balance Overall balance assessment: Needs assistance Sitting-balance support: Bilateral upper extremity supported, Feet supported Sitting balance-Leahy Scale: Good Sitting balance - Comments: seated EOB   Standing balance support: Bilateral upper extremity supported, During functional activity, Reliant on assistive device for balance Standing balance-Leahy Scale: Fair Standing balance comment: using RW                           ADL either performed or assessed with clinical judgement   ADL Overall ADL's : Needs assistance/impaired     Grooming: Supervision/safety;Min guard;Standing       Lower Body Bathing: Maximal assistance;Sitting/lateral leans  Lower Body Dressing: Maximal assistance;Sitting/lateral leans Lower Body Dressing Details (indicate cue type and reason): Pt uses sock aide at baseline. Toilet Transfer: Supervision/safety;Min guard;Ambulation            Functional mobility during ADLs: Supervision/safety;Min guard;Rolling walker (2 wheels) General ADL Comments: Pt ambulated >50 feet in room and hall with RW.     Vision Baseline Vision/History: 1 Wears glasses Ability to See in Adequate Light: 0 Adequate Patient Visual Report: No change from baseline Vision Assessment?: No apparent visual deficits                Pertinent Vitals/Pain Pain Assessment Pain Assessment: No/denies pain     Hand Dominance Right   Extremity/Trunk Assessment Upper Extremity Assessment Upper Extremity Assessment: Generalized weakness (L UE limited at baseline 3-/5 shoulder flexion MMT.)   Lower Extremity Assessment Lower Extremity Assessment: Defer to PT evaluation   Cervical / Trunk Assessment Cervical / Trunk Assessment: Kyphotic   Communication Communication Communication: HOH   Cognition Arousal/Alertness: Awake/alert Behavior During Therapy: WFL for tasks assessed/performed Overall Cognitive Status: Within Functional Limits for tasks assessed                                                        Home Living Family/patient expects to be discharged to:: Private residence Living Arrangements: Spouse/significant other Available Help at Discharge: Family;Skilled Nursing Facility Type of Home: House Home Access: Ramped entrance     Home Layout: Laundry or work area in basement;Able to live on main level with bedroom/bathroom     Bathroom Shower/Tub: Occupational psychologist: Standard (BSC placed over toilet)     Home Equipment: Conservation officer, nature (2 wheels);Wheelchair - manual;BSC/3in1;Adaptive equipment;Cane - single point;Rollator (4 wheels);Other (comment) (adjustable chair) Adaptive Equipment: Reacher        Prior Functioning/Environment Prior Level of Function : Needs assist       Physical Assist : Mobility (physical);ADLs (physical)   ADLs (physical):  IADLs;Bathing;Dressing Mobility Comments: Household ambulation with RW ADLs Comments: pt's husband assists with LB dressing tasks and bathing as needed. Assist with IADL's as well.        OT Problem List: Decreased strength;Decreased activity tolerance;Impaired balance (sitting and/or standing)      OT Treatment/Interventions: Self-care/ADL training;Therapeutic exercise;Patient/family education;Balance training;Therapeutic activities    OT Goals(Current goals can be found in the care plan section) Acute Rehab OT Goals Patient Stated Goal: return home OT Goal Formulation: With patient Time For Goal Achievement: 09/18/22 Potential to Achieve Goals: Good  OT Frequency: Min 1X/week    Co-evaluation PT/OT/SLP Co-Evaluation/Treatment: Yes Reason for Co-Treatment: To address functional/ADL transfers   OT goals addressed during session: ADL's and self-care                       End of Session Equipment Utilized During Treatment: Rolling walker (2 wheels)  Activity Tolerance: Patient tolerated treatment well Patient left: in chair;with call bell/phone within reach;with family/visitor present  OT Visit Diagnosis: Muscle weakness (generalized) (M62.81);Unsteadiness on feet (R26.81)                Time: 4193-7902 OT Time Calculation (min): 17 min Charges:  OT General Charges $OT Visit: 1 Visit OT Evaluation $OT Eval Low Complexity: [place] OT, MOT  [employer]  09/04/2022, 10:25 AM

## 2022-09-04 NOTE — Evaluation (Signed)
Physical Therapy Evaluation Patient Details Name: Tasha Moss MRN: [id number] DOB: 12-Nov-1936 Today's Date: 09/04/2022  History of Present Illness  SHERONICA Moss is a 86 y.o. female with medical history significant of hypertension, hyperlipidemia, CHF, hypothyroidism status post partial thyroidectomy, GERD, esophageal stricture (2011) who presents to the emergency department accompanied by daughter due to nausea, worsening weakness and worsening rash in buttock area.  She complained of simvastatin about 10 days ago and she presented to the ED on 9/17 during which blood work-up and CT scan done showed no acute pathology, however, she was diagnosed to have cellulitis of the buttock and was discharged with Keflex.  Patient's weakness worsened to the extent that she was unable to ambulate today (she was able to walk as of 2 days ago), so it was decided for her to go to the ED for further evaluation and management.   Clinical Impression  Patient demonstrates slightly labored movement for ambulation with RW without loss of balance limited mostly due to fatigue. Patient tolerated sitting up in chair after therapy - nursing staff notified. Patient will benefit from continued skilled physical therapy in hospital and recommended venue below to increase strength, balance, endurance for safe ADLs and gait.     Recommendations for follow up therapy are one component of a multi-disciplinary discharge planning process, led by the attending physician.  Recommendations may be updated based on patient status, additional functional criteria and insurance authorization.  Follow Up Recommendations Home health PT      Assistance Recommended at Discharge Set up Supervision/Assistance  Patient can return home with the following  A little help with walking and/or transfers;A little help with bathing/dressing/bathroom;Assistance with cooking/housework;Help with stairs or ramp for entrance    Equipment Recommendations  None recommended by PT  Recommendations for Other Services       Functional Status Assessment Patient has had a recent decline in their functional status and demonstrates the ability to make significant improvements in function in a reasonable and predictable amount of time.     Precautions / Restrictions Precautions Precautions: Fall Restrictions Weight Bearing Restrictions: No      Mobility  Bed Mobility Overal bed mobility: Modified Independent             General bed mobility comments: HOB elevated    Transfers Overall transfer level: Needs assistance Equipment used: Rolling walker (2 wheels) Transfers: Sit to/from Stand, Bed to chair/wheelchair/BSC Sit to Stand: Supervision, Min guard   Step pivot transfers: Min guard       General transfer comment: slow movement with RW, increased time    Ambulation/Gait Ambulation/Gait assistance: Modified independent (Device/Increase time), Supervision Gait Distance (Feet): 65 Feet Assistive device: Rolling walker (2 wheels) Gait Pattern/deviations: Step-through pattern, Decreased step length - right, Decreased step length - left, Decreased stride length Gait velocity: decreased     General Gait Details: Patient with slowed cadence, slightly unsteady on feet, requires use of RW, mostly limited due to fatigue  Stairs            Wheelchair Mobility    Modified Rankin (Stroke Patients Only)       Balance Overall balance assessment: Needs assistance Sitting-balance support: Bilateral upper extremity supported, Feet supported Sitting balance-Leahy Scale: Good Sitting balance - Comments: seated EOB   Standing balance support: Bilateral upper extremity supported, During functional activity, Reliant on assistive device for balance Standing balance-Leahy Scale: Fair Standing balance comment: fair/good using RW  Pertinent Vitals/Pain Pain Assessment Pain Assessment:  No/denies pain    Home Living Family/patient expects to be discharged to:: Private residence Living Arrangements: Spouse/significant other Available Help at Discharge: Family Type of Home: House Home Access: Ramped entrance       Home Layout: Able to live on main level with bedroom/bathroom Home Equipment: Rolling Walker (2 wheels);Wheelchair - manual;BSC/3in1;Cane - single point;Rollator (4 wheels)      Prior Function Prior Level of Function : Needs assist       Physical Assist : Mobility (physical);ADLs (physical) Mobility (physical): Gait;Stairs;Transfers ADLs (physical): IADLs;Bathing;Dressing Mobility Comments: household ambulator with RW ADLs Comments: Refer to OT notes     Hand Dominance   Dominant Hand: Right    Extremity/Trunk Assessment   Upper Extremity Assessment Upper Extremity Assessment: Defer to OT evaluation    Lower Extremity Assessment Lower Extremity Assessment: Generalized weakness    Cervical / Trunk Assessment Cervical / Trunk Assessment: Kyphotic  Communication   Communication: HOH  Cognition Arousal/Alertness: Awake/alert Behavior During Therapy: WFL for tasks assessed/performed Overall Cognitive Status: Within Functional Limits for tasks assessed                                          General Comments      Exercises     Assessment/Plan    PT Assessment Patient needs continued PT services  PT Problem List Decreased strength;Decreased activity tolerance;Decreased balance;Decreased mobility       PT Treatment Interventions DME instruction;Gait training;Stair training;Functional mobility training;Therapeutic activities;Therapeutic exercise;Balance training;Patient/family education    PT Goals (Current goals can be found in the Care Plan section)  Acute Rehab PT Goals Patient Stated Goal: return home with spouse to assist PT Goal Formulation: With patient/family Time For Goal Achievement: 09/18/22 Potential  to Achieve Goals: Good    Frequency Min 3X/week     Co-evaluation PT/OT/SLP Co-Evaluation/Treatment: Yes Reason for Co-Treatment: To address functional/ADL transfers PT goals addressed during session: Mobility/safety with mobility;Balance;Proper use of DME OT goals addressed during session: ADL's and self-care       AM-PAC PT "6 Clicks" Mobility  Outcome Measure Help needed turning from your back to your side while in a flat bed without using bedrails?: None Help needed moving from lying on your back to sitting on the side of a flat bed without using bedrails?: None Help needed moving to and from a bed to a chair (including a wheelchair)?: A Little Help needed standing up from a chair using your arms (e.g., wheelchair or bedside chair)?: A Little Help needed to walk in hospital room?: A Little Help needed climbing 3-5 steps with a railing? : A Lot 6 Click Score: 19    End of Session   Activity Tolerance: Patient tolerated treatment well;Patient limited by fatigue Patient left: in chair;with call bell/phone within reach Nurse Communication: Mobility status PT Visit Diagnosis: Unsteadiness on feet (R26.81);Other abnormalities of gait and mobility (R26.89);Muscle weakness (generalized) (M62.81)    Time: 7829-5621 PT Time Calculation (min) (ACUTE ONLY): 30 min   Charges:   PT Evaluation $PT Eval Moderate Complexity: 1 Mod PT Treatments $Therapeutic Activity: 23-37 mins        Zigmund Gottron, SPT

## 2022-09-04 NOTE — Progress Notes (Signed)
Initial Nutrition Assessment  DOCUMENTATION CODES:      INTERVENTION:  -continuous Jevity 1.5 @ 40 ml/hr and ProSource TF20 (60 ml)-per PEG   -1 packet Juven BID per PEG- to support wound healing BID  Tube feeding regimen including JUVEN provides: 1710 kcal, 86 gr protein and 730 ml fluid. Meet 100% of est energy and protein needs.  NUTRITION DIAGNOSIS:   Inadequate oral intake related to chronic illness (PEG tube dependent) as evidenced by  (Chronic Nectar-thick clear liquids and tube feeding).   GOAL:  Patient will meet greater than or equal to 90% of their needs  MONITOR:  TF tolerance, Labs, Weight trends  REASON FOR ASSESSMENT:   Consult Enteral/tube feeding initiation and management  ASSESSMENT: Patient is an 86 yo female with history of HTN, CHF, GERD, esophageal stricture (savory dilation 01/11/22) and hypothyroidism. Presents with increased weakness and rash on buttocks. Patient with PEG tube (placed 01/17/22).  Talked with patient and spouse. At home tube feeding regimen for 18 hr daily (4 pm->10 am) Jevity 1.5 at 65 ml/hr. Patient complains frequently feeling nauseated and reports long time problem with motion sickness. She is also complaining of bitter taste in mouth. History of reflux.  Patient currently receiving clear liquids. Patient on nectar-thick liquids at home.  Patient also drinks 4 oz juice (nectar thick) TID daily.  She eats crushed ice throughout the day and uses hard candy to moisten mouth.  Patient has been maintaining weight between 56-61 kg the past 9 months. She has gradually gained 3 kg since July.   Medications: B-12, Protonix, JUVEN     Latest Ref Rng & Units 09/04/2022    9:03 AM 09/03/2022    5:25 AM 09/02/2022    7:15 PM  BMP  Glucose 70 - 99 mg/dL 97  101  130   BUN 8 - 23 mg/dL '24  14  14   '$ Creatinine 0.44 - 1.00 mg/dL 0.45  0.50  0.48   Sodium 135 - 145 mmol/L 139  138  137   Potassium 3.5 - 5.1 mmol/L 4.3  3.9  4.4   Chloride 98 -  111 mmol/L 106  106  101   CO2 22 - 32 mmol/L '30  26  29   '$ Calcium 8.9 - 10.3 mg/dL 8.1  8.4  8.7      NUTRITION - FOCUSED PHYSICAL EXAM: Deferred    Diet Order:   Diet Order             Diet clear liquid Room service appropriate? Yes; Fluid consistency: Thin  Diet effective now                   EDUCATION NEEDS:  Education needs have been addressed  Skin:  Skin Assessment: Reviewed RN Assessment (redness to buttocks)  Last BM: 9/27 per patient  Height:   Ht Readings from Last 1 Encounters:  09/02/22 '5\' 5"'$  (1.651 m)    Weight:   Wt Readings from Last 1 Encounters:  09/04/22 61.2 kg    Ideal Body Weight:   57 kg   BMI:  Body mass index is 22.45 kg/m.  Estimated Nutritional Needs:   Kcal:  1500-1700  Protein:  75-80 gr  Fluid:  >1500 ml daily  Colman Cater MS,RD,CSG,LDN Contact:AMION

## 2022-09-04 NOTE — Progress Notes (Signed)
PROGRESS NOTE  Tasha Moss  LFY:[id number] DOB: 01-18-1936 DOA: 09/02/2022 PCP: [name], MD   Brief Narrative:  Patient is a 86 year old female with history of hypertension, hyperlipidemia, CHF, hypothyroidism, status post partial thyroidectomy, GERD, esophageal stricture who presented to the emergency department with complaint of nausea, vomiting, weakness, rash on the buttock area.  She was recently seen in ED on 9/17 and was diagnosed to have cellulitis of the buttock and was discharged on Keflex.  Patient's weakness worsened and she was unable to ambulate and decided to come to the emergency department.  On presentation, patient was hemodynamically stable.  Patient was started on IV antibiotics for cellulitis.  Started on gentle IV fluids.    Assessment & Plan:  Principal Problem:   Cellulitis of buttock Active Problems:   Essential hypertension   Stricture and stenosis of esophagus   GERD (gastroesophageal reflux disease)   Nausea   Generalized weakness   Dehydration   Feeding by G-tube (HCC)   Cellulitis/rash of buttocks/posterior bilateral thigh with pressure 1 ulcer: Was recently seen in the emergency department and was prescribed oral antibiotics with did not help.  Patient presented with weakness.  Hemodynamically stable on presentation, afebrile, no leukocytosis.  CT abdomen/pelvis done, did not show any acute findings.  Wound care consulted.  Continue ceftriaxone and vancomycin for now.  Follow-up cultures.  Cellulitis area looks much better today.  Redness has improved  Nausea/vomiting: Continue supportive care, antiemetics.  Abdomen CT did not show any acute findings.  Generalized weakness/deconditioning: We will consult PT/OT.  Dehydration: Continue gentle IV fluids  Hypertension: Currently blood pressure stable.  On propranolol  History of GERD/esophageal stricture: Continue PPI  Acquired  hypothyroidism: Continue levothyroxine  Chronic diastolic CHF:  Echo done on February 2 130 showed EF of 65%, no wall motion abnormality, grade 1 diastolic dysfunction Currently euvolemic  Status post G-tube: Continue G-tube feeding.  Nutrition is consulted          DVT prophylaxis:enoxaparin (LOVENOX) injection 40 mg Start: 09/03/22 0800 SCDs Start: 09/03/22 0243     Code Status: Full Code  Family Communication: Husband at bedside  Patient status:Inpatient  Patient is from :Home  Anticipated discharge EN:IDPO  Estimated DC date:1-2 days   Consultants: None  Procedures:None  Antimicrobials:  Anti-infectives (From admission, onward)    Start     Dose/Rate Route Frequency Ordered Stop   09/03/22 0045  cefTRIAXone (ROCEPHIN) 1 g in sodium chloride 0.9 % 100 mL IVPB        1 g 200 mL/hr over 30 Minutes Intravenous Every 24 hours 09/03/22 0030 09/10/22 0029   09/02/22 2315  doxycycline (VIBRA-TABS) tablet 100 mg  Status:  Discontinued        100 mg Oral  Once 09/02/22 2303 09/03/22 0030       Subjective:  Patient seen and examined the bedside today.  Hemodynamically stable .  Appears comfortable.  Having some nausea.  Appears overall improved.  Denies any vomiting.  Cellulitis area on the buttocks checked at bedside and did not see any ulcer, erythema is improving  Objective: Vitals:   09/03/22 1255 09/03/22 1713 09/03/22 1951 09/04/22 0516  BP: 139/78 132/76 (!) 117/54 (!) 113/59  Pulse: 78 75 72 64  Resp: '17 18 20 17  '$ Temp: 98.1 F (36.7 C) 98.5 F (36.9 C) 98.2 F (36.8 C) 98 F (36.7 C)  TempSrc: Oral Oral Oral Oral  SpO2: 95% 95% 95% 93%  Weight:    61.2  kg  Height:        Intake/Output Summary (Last 24 hours) at 09/04/2022 0905 Last data filed at 09/04/2022 4132 Gross per 24 hour  Intake 1358.15 ml  Output 1300 ml  Net 58.15 ml   Filed Weights   09/02/22 1746 09/04/22 0516  Weight: 58.1 kg 61.2 kg    Examination:  General exam: Overall comfortable, not in distress, pleasant elderly female HEENT:  PERRL Respiratory system:  no wheezes or crackles  Cardiovascular system: S1 & S2 heard, RRR.  Gastrointestinal system: Abdomen is nondistended, soft and nontender.  G-tube Central nervous system: Alert and oriented Extremities: No edema, no clubbing ,no cyanosis Skin: Erythema on the bilateral buttocks without obvious ulcer     Data Reviewed: I have personally reviewed following labs and imaging studies  CBC: Recent Labs  Lab 09/02/22 1915 09/03/22 0525  WBC 9.0 5.4  NEUTROABS 8.1*  --   HGB 13.4 12.0  HCT 40.2 36.6  MCV 94.8 95.1  PLT 194 440   Basic Metabolic Panel: Recent Labs  Lab 09/02/22 1915 09/03/22 0525  NA 137 138  K 4.4 3.9  CL 101 106  CO2 29 26  GLUCOSE 130* 101*  BUN 14 14  CREATININE 0.48 0.50  CALCIUM 8.7* 8.4*  MG 1.9  --   PHOS  --  3.7     Recent Results (from the past 240 hour(s))  Resp Panel by RT-PCR (Flu A&B, Covid) Anterior Nasal Swab     Status: None   Collection Time: 09/02/22 10:45 PM   Specimen: Anterior Nasal Swab  Result Value Ref Range Status   SARS Coronavirus 2 by RT PCR NEGATIVE NEGATIVE Final    Comment: (NOTE) SARS-CoV-2 target nucleic acids are NOT DETECTED.  The SARS-CoV-2 RNA is generally detectable in upper respiratory specimens during the acute phase of infection. The lowest concentration of SARS-CoV-2 viral copies this assay can detect is 138 copies/mL. A negative result does not preclude SARS-Cov-2 infection and should not be used as the sole basis for treatment or other patient management decisions. A negative result may occur with  improper specimen collection/handling, submission of specimen other than nasopharyngeal swab, presence of viral mutation(s) within the areas targeted by this assay, and inadequate number of viral copies(<138 copies/mL). A negative result must be combined with clinical observations, patient history, and epidemiological information. The expected result is Negative.  Fact Sheet for  Patients:  EntrepreneurPulse.com.au  Fact Sheet for Healthcare Providers:  IncredibleEmployment.be  This test is no t yet approved or cleared by the [place] FDA and  has been authorized for detection and/or diagnosis of SARS-CoV-2 by FDA under an Emergency Use Authorization (EUA). This EUA will remain  in effect (meaning this test can be used) for the duration of the COVID-19 declaration under Section 564(b)(1) of the Act, 21 U.S.C.section 360bbb-3(b)(1), unless the authorization is terminated  or revoked sooner.       Influenza A by PCR NEGATIVE NEGATIVE Final   Influenza B by PCR NEGATIVE NEGATIVE Final    Comment: (NOTE) The Xpert Xpress SARS-CoV-2/FLU/RSV plus assay is intended as an aid in the diagnosis of influenza from Nasopharyngeal swab specimens and should not be used as a sole basis for treatment. Nasal washings and aspirates are unacceptable for Xpert Xpress SARS-CoV-2/FLU/RSV testing.  Fact Sheet for Patients: EntrepreneurPulse.com.au  Fact Sheet for Healthcare Providers: IncredibleEmployment.be  This test is not yet approved or cleared by the [place] FDA and has been authorized for detection and/or  diagnosis of SARS-CoV-2 by FDA under an Emergency Use Authorization (EUA). This EUA will remain in effect (meaning this test can be used) for the duration of the COVID-19 declaration under Section 564(b)(1) of the Act, 21 U.S.C. section 360bbb-3(b)(1), unless the authorization is terminated or revoked.  Performed at [facility], [address], [place], [place] [address]      Radiology Studies: No results found.  Scheduled Meds:  enoxaparin (LOVENOX) injection  40 mg Subcutaneous Q24H   feeding supplement (PROSource TF20)  45 mL Per Tube Daily   levothyroxine  112 mcg Oral Daily   nutrition supplement (JUVEN)  1 packet Per Tube BID BM   mouth rinse  15 mL Mouth Rinse 4 times  per day   pantoprazole  40 mg Per Tube Daily   propranolol ER  60 mg Oral Daily   vitamin B-12  250 mcg Oral Daily   Continuous Infusions:  cefTRIAXone (ROCEPHIN)  IV Stopped (09/04/22 0119)   feeding supplement (JEVITY 1.5 CAL/FIBER) 50 mL/hr (09/04/22 0513)     LOS: 2 days   Shelly Coss, MD [facility] P9/27/2023, 9:05 AM

## 2022-09-04 NOTE — TOC Initial Note (Signed)
Transition of Care [facility]) - Initial/Assessment Note    Patient Details  Name: Tasha Moss MRN: [id number] Date of Birth: 30-Dec-1935  [facility]) CM/SW Contact:    Ihor Gully, LCSW Phone Number: 09/04/2022, 12:42 PM  Clinical Narrative:                 Patient from home with spouse. Admitted for cellulitis of buttock. Considered high risk for readmission. Receives tube feedings, palliative services are involved. Has walker, shower chair, reacher, apparatus to put on socks, bsc, railings in bathroom. PT recommends HHPT. Daughter agreeable. [place] providers discussed. [facility] accepting of referral.   Expected Discharge Plan: [place] Barriers to Discharge: Continued Medical Work up   Patient Goals and CMS Choice Patient states their goals for this hospitalization and ongoing recovery are:: home with [facility]   Choice offered to / list presented to : Adult Children  Expected Discharge Plan and Services Expected Discharge Plan: [place] Acute Care Choice: [place] arrangements for the past 2 months: [place]: PT [place] Agency: [place] ([place]) Date [place]: 09/04/22 Time [place]: 1242 Representative spoke with at [place]: [place] Arrangements/Services Living arrangements for the past 2 months: [place] Lives with:: Spouse Patient language and need for interpreter reviewed:: Yes Do you feel safe going back to the place where you live?: Yes      Need for Family Participation in Patient Care: Yes (Comment) Care giver support system in place?: Yes (comment) Current home services: DME, Other (comment) (palliative services.) Criminal Activity/Legal Involvement Pertinent to Current Situation/Hospitalization: No - Comment as needed  Activities of Daily Living Home Assistive Devices/Equipment: Eyeglasses,  Shower chair with back, Environmental consultant (specify type) ADL Screening (condition at time of admission) Patient's cognitive ability adequate to safely complete daily activities?: Yes Is the patient deaf or have difficulty hearing?: Yes Does the patient have difficulty seeing, even when wearing glasses/contacts?: No Does the patient have difficulty concentrating, remembering, or making decisions?: No Patient able to express need for assistance with ADLs?: Yes Does the patient have difficulty dressing or bathing?: No Independently performs ADLs?: No Communication: Independent Dressing (OT): Needs assistance Is this a change from baseline?: Pre-admission baseline Grooming: Needs assistance Is this a change from baseline?: Pre-admission baseline Feeding: Needs assistance Is this a change from baseline?: Pre-admission baseline Bathing: Needs assistance Is this a change from baseline?: Pre-admission baseline Toileting: Needs assistance Is this a change from baseline?: Pre-admission baseline In/Out Bed: Independent Walks in Home: Independent with device (comment) Does the patient have difficulty walking or climbing stairs?: Yes Weakness of Legs: Both Weakness of Arms/Hands: None  Permission Sought/Granted Permission sought to share information with : Family Supports    Share Information with NAME: daughter, [name]           Emotional Assessment         Alcohol / Substance Use: Not Applicable Psych Involvement: No (comment)  Admission diagnosis:  Cellulitis of buttock [L03.317] Severe nausea [R11.0] Patient Active Problem List   Diagnosis Date Noted   Generalized weakness 09/03/2022   Dehydration 09/03/2022   Feeding by G-tube ([place]) 09/03/2022   Cellulitis of buttock 09/02/2022   Feeding problems    Malnutrition  of moderate degree 01/09/2022   Acute CHF (congestive heart failure) ([place]) 01/08/2022   Sacral decubitus ulcer, stage II ([place]) 01/08/2022   Failure to thrive in adult  01/07/2022   Non-seasonal allergic rhinitis 11/29/2021   SARS-associated coronavirus exposure 11/24/2021   Unspecified protein-calorie malnutrition ([place]) 11/24/2021   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 ([place]) 11/06/2021   Elevated BP without diagnosis of hypertension 10/31/2020   Dizziness 10/31/2020   Status post reverse arthroplasty of left shoulder 03/11/2016   Depression with anxiety 03/11/2016   Routine general medical examination at a health care facility 06/29/2014   H/O thyroid nodule 06/21/2013   DIVERTICULOSIS, COLON 07/18/2010   Nausea 07/16/2010   GERD (gastroesophageal reflux disease) 04/09/2010   Stricture and stenosis of esophagus 02/21/2010   Dysphagia 02/09/2010   Osteoarthritis 11/22/2009   Benign paroxysmal positional vertigo 05/15/2009   Essential hypertension 09/28/2008   SPINAL STENOSIS, LUMBAR 09/28/2008   PERNICIOUS ANEMIA 03/28/2008   Hypothyroidism 09/08/2007   HYPERLIPIDEMIA 09/08/2007   RESTLESS LEG SYNDROME 09/08/2007   HASHIMOTO'S THYROIDITIS 02/23/2007   PCP:  [name], MD Pharmacy:   [place], [place] [address] [place] [address] Phone: [phone] Fax: [phone]     Social Determinants of Health (SDOH) Interventions    Readmission Risk Interventions     No data to display

## 2022-09-05 DIAGNOSIS — L03317 Cellulitis of buttock: Secondary | ICD-10-CM | POA: Diagnosis not present

## 2022-09-05 MED ORDER — POLYVINYL ALCOHOL 1.4 % OP SOLN
1.0000 [drp] | OPHTHALMIC | Status: DC | PRN
  Administered 2022-09-05: 1 [drp] via OPHTHALMIC
  Filled 2022-09-05: qty 15

## 2022-09-05 MED ORDER — FREE WATER
100.0000 mL | Freq: Every day | Status: DC
  Administered 2022-09-05 – 2022-09-06 (×2): 100 mL

## 2022-09-05 NOTE — Progress Notes (Signed)
Patient bottom reddened, daughter and physician had removed prior dressing and assessed area together. Area of bottom cleansed and then new dressing applied to sacrum. Patient's peg tube feeding line exchanged for new. Patient given tylenol for achy pain to left knee.

## 2022-09-05 NOTE — Progress Notes (Signed)
Patient feeding pump was beeping this morning. Corrected. Patient given meds via peg tube and flushed between each med. Patient given supplement and flushed behind supplement. Patient was given nausea med prior via IV. Patient peg tube site was cleansed well with soap and water, then gauze reapplied. Patient reported not having a bath. Notified CNA. Patient then reported wanting her eyes cleansed. Cleansed patients eyes. Patient stated she wanted eye drops, notified Dr. Tawanna Solo. Patient requested straw, brought patient straw. Patient then requested ice, brought patient ice. Patient husband at bedside.

## 2022-09-05 NOTE — Progress Notes (Signed)
PROGRESS NOTE  Tasha Moss  QMG:[id number] DOB: 01-Oct-1936 DOA: 09/02/2022 PCP: [name], MD   Brief Narrative:  Patient is a 86 year old female with history of hypertension, hyperlipidemia, CHF, hypothyroidism, status post partial thyroidectomy, GERD, esophageal stricture who presented to the emergency department with complaint of nausea, vomiting, weakness, rash on the buttock area.  She was recently seen in ED on 9/17 and was diagnosed to have cellulitis of the buttock and was discharged on Keflex.  Patient's weakness worsened and she was unable to ambulate and decided to come to the emergency department.  On presentation, patient was hemodynamically stable.  Patient was started on IV antibiotics for cellulitis.  PT/OT recommending home health.  Plan for discharge tomorrow   Assessment & Plan:  Principal Problem:   Cellulitis of buttock Active Problems:   Essential hypertension   Stricture and stenosis of esophagus   GERD (gastroesophageal reflux disease)   Nausea   Generalized weakness   Dehydration   Feeding by G-tube (HCC)   Cellulitis/rash of buttocks/posterior bilateral thigh with pressure 1 ulcer: Was recently seen in the emergency department and was prescribed oral antibiotics with did not help.  Patient presented with weakness.  Hemodynamically stable on presentation, afebrile, no leukocytosis.  CT abdomen/pelvis done, did not show any acute findings.  Wound care consulted.  Continue ceftriaxone and vancomycin for now.  Follow-up cultures,NGTD.  Cellulitis area looks much better today.  Redness has improved but still there.  Continue current antibiotics.  Plan for discharge to home with oral antibiotics tomorrow  Nausea/vomiting: Continue supportive care, antiemetics.  Abdomen CT did not show any acute findings.  Generalized weakness/deconditioning: Home health recommended by PT/OT.  Dehydration: Treated with gentle IV fluids.  IV fluids stopped  Hypertension:  Currently blood pressure stable.  On propranolol  History of GERD/esophageal stricture: Continue PPI, antiemetics  Acquired  hypothyroidism: Continue levothyroxine  Chronic diastolic CHF: Echo done on February 2 130 showed EF of 65%, no wall motion abnormality, grade 1 diastolic dysfunction Currently euvolemic  Status post G-tube: Continue G-tube feeding.  Nutritionist consulted and following       Nutrition Problem: Inadequate oral intake Etiology: chronic illness (PEG tube dependent)    DVT prophylaxis:enoxaparin (LOVENOX) injection 40 mg Start: 09/03/22 0800 SCDs Start: 09/03/22 0243     Code Status: Full Code  Family Communication: Daughter/Husband at bedside  Patient status:Inpatient  Patient is from :Home  Anticipated discharge TO:[facility]  Estimated DC date:tomorrow   Consultants: None  Procedures:None  Antimicrobials:  Anti-infectives (From admission, onward)    Start     Dose/Rate Route Frequency Ordered Stop   09/03/22 0045  cefTRIAXone (ROCEPHIN) 1 g in sodium chloride 0.9 % 100 mL IVPB        1 g 200 mL/hr over 30 Minutes Intravenous Every 24 hours 09/03/22 0030 09/10/22 0029   09/02/22 2315  doxycycline (VIBRA-TABS) tablet 100 mg  Status:  Discontinued        100 mg Oral  Once 09/02/22 2303 09/03/22 0030       Subjective:  Patient seen and examined at bedside today.  Hemodynamically stable.  Sitting in the chair.  Complains of some nausea but her pain in the buttock is definitely better but still has some.  Redness in the buttock area is better today.  Not sure whether she is ready for discharge today  Objective: Vitals:   09/04/22 2149 09/05/22 0500 09/05/22 0501 09/05/22 1100  BP: (!) 119/56  (!) 113/58   Pulse:  64  63   Resp: 20     Temp: 97.8 F (36.6 C)  97.9 F (36.6 C)   TempSrc: Oral     SpO2: 93%  92% 92%  Weight:  61.8 kg    Height:        Intake/Output Summary (Last 24 hours) at 09/05/2022 1158 Last data filed at 09/05/2022  0500 Gross per 24 hour  Intake --  Output 200 ml  Net -200 ml   Filed Weights   09/02/22 1746 09/04/22 0516 09/05/22 0500  Weight: 58.1 kg 61.2 kg 61.8 kg    Examination:  General exam: Overall comfortable, not in distress,pleasant elderly female HEENT: PERRL Respiratory system:  no wheezes or crackles  Cardiovascular system: S1 & S2 heard, RRR.  Gastrointestinal system: Abdomen is nondistended, soft and nontender. Central nervous system: Alert and oriented Extremities: No edema, no clubbing ,no cyanosis Skin: No rashes, no ulcers,no icterus   Skin: Erythema on the bilateral buttocks without obvious ulcer     Data Reviewed: I have personally reviewed following labs and imaging studies  CBC: Recent Labs  Lab 09/02/22 1915 09/03/22 0525 09/04/22 0903  WBC 9.0 5.4 5.4  NEUTROABS 8.1*  --  3.8  HGB 13.4 12.0 12.4  HCT 40.2 36.6 38.5  MCV 94.8 95.1 96.5  PLT 194 176 935   Basic Metabolic Panel: Recent Labs  Lab 09/02/22 1915 09/03/22 0525 09/04/22 0903  NA 137 138 139  K 4.4 3.9 4.3  CL 101 106 106  CO2 '29 26 30  '$ GLUCOSE 130* 101* 97  BUN 14 14 24*  CREATININE 0.48 0.50 0.45  CALCIUM 8.7* 8.4* 8.1*  MG 1.9  --  1.8  PHOS  --  3.7 3.1     Recent Results (from the past 240 hour(s))  Resp Panel by RT-PCR (Flu A&B, Covid) Anterior Nasal Swab     Status: None   Collection Time: 09/02/22 10:45 PM   Specimen: Anterior Nasal Swab  Result Value Ref Range Status   SARS Coronavirus 2 by RT PCR NEGATIVE NEGATIVE Final    Comment: (NOTE) SARS-CoV-2 target nucleic acids are NOT DETECTED.  The SARS-CoV-2 RNA is generally detectable in upper respiratory specimens during the acute phase of infection. The lowest concentration of SARS-CoV-2 viral copies this assay can detect is 138 copies/mL. A negative result does not preclude SARS-Cov-2 infection and should not be used as the sole basis for treatment or other patient management decisions. A negative result may  occur with  improper specimen collection/handling, submission of specimen other than nasopharyngeal swab, presence of viral mutation(s) within the areas targeted by this assay, and inadequate number of viral copies(<138 copies/mL). A negative result must be combined with clinical observations, patient history, and epidemiological information. The expected result is Negative.  Fact Sheet for Patients:  EntrepreneurPulse.com.au  Fact Sheet for Healthcare Providers:  IncredibleEmployment.be  This test is no t yet approved or cleared by the [place] FDA and  has been authorized for detection and/or diagnosis of SARS-CoV-2 by FDA under an Emergency Use Authorization (EUA). This EUA will remain  in effect (meaning this test can be used) for the duration of the COVID-19 declaration under Section 564(b)(1) of the Act, 21 U.S.C.section 360bbb-3(b)(1), unless the authorization is terminated  or revoked sooner.       Influenza A by PCR NEGATIVE NEGATIVE Final   Influenza B by PCR NEGATIVE NEGATIVE Final    Comment: (NOTE) The Xpert Xpress SARS-CoV-2/FLU/RSV plus assay is intended  as an aid in the diagnosis of influenza from Nasopharyngeal swab specimens and should not be used as a sole basis for treatment. Nasal washings and aspirates are unacceptable for Xpert Xpress SARS-CoV-2/FLU/RSV testing.  Fact Sheet for Patients: EntrepreneurPulse.com.au  Fact Sheet for Healthcare Providers: IncredibleEmployment.be  This test is not yet approved or cleared by the [place] FDA and has been authorized for detection and/or diagnosis of SARS-CoV-2 by FDA under an Emergency Use Authorization (EUA). This EUA will remain in effect (meaning this test can be used) for the duration of the COVID-19 declaration under Section 564(b)(1) of the Act, 21 U.S.C. section 360bbb-3(b)(1), unless the authorization is terminated  or revoked.  Performed at [facility], [address], [place], [place] [address]   Culture, blood (Routine X 2) w Reflex to ID Panel     Status: None (Preliminary result)   Collection Time: 09/04/22 12:42 PM   Specimen: BLOOD RIGHT ARM  Result Value Ref Range Status   Specimen Description BLOOD RIGHT ARM  Final   Special Requests   Final    BOTTLES DRAWN AEROBIC AND ANAEROBIC Blood Culture results may not be optimal due to an excessive volume of blood received in culture bottles   Culture   Final    NO GROWTH < 12 HOURS Performed at [facility], [address], [place], [place] [address]    Report Status PENDING  Incomplete  Culture, blood (Routine X 2) w Reflex to ID Panel     Status: None (Preliminary result)   Collection Time: 09/04/22 12:42 PM   Specimen: BLOOD LEFT ARM  Result Value Ref Range Status   Specimen Description BLOOD LEFT ARM  Final   Special Requests   Final    BOTTLES DRAWN AEROBIC AND ANAEROBIC Blood Culture adequate volume   Culture   Final    NO GROWTH < 12 HOURS Performed at [facility], [address], [place], [place] [address]    Report Status PENDING  Incomplete     Radiology Studies: No results found.  Scheduled Meds:  enoxaparin (LOVENOX) injection  40 mg Subcutaneous Q24H   feeding supplement (PROSource TF20)  60 mL Per Tube Daily   free water  100 mL Per Tube Daily   levothyroxine  112 mcg Oral Daily   nutrition supplement (JUVEN)  1 packet Per Tube BID BM   mouth rinse  15 mL Mouth Rinse 4 times per day   pantoprazole  40 mg Per Tube Daily   propranolol ER  60 mg Oral Daily   vitamin B-12  250 mcg Oral Daily   Continuous Infusions:  cefTRIAXone (ROCEPHIN)  IV 1 g (09/05/22 0032)   feeding supplement (JEVITY 1.5 CAL/FIBER) 40 mL/hr (09/04/22 1647)     LOS: 3 days   Shelly Coss, MD [facility] P9/28/2023, 11:58 AM

## 2022-09-06 ENCOUNTER — Other Ambulatory Visit: Payer: Self-pay | Admitting: Family Medicine

## 2022-09-06 DIAGNOSIS — L03317 Cellulitis of buttock: Secondary | ICD-10-CM | POA: Diagnosis not present

## 2022-09-06 DIAGNOSIS — R519 Headache, unspecified: Secondary | ICD-10-CM

## 2022-09-06 LAB — CBC
HCT: 34.3 % — ABNORMAL LOW (ref 36.0–46.0)
Hemoglobin: 11.2 g/dL — ABNORMAL LOW (ref 12.0–15.0)
MCH: 31.3 pg (ref 26.0–34.0)
MCHC: 32.7 g/dL (ref 30.0–36.0)
MCV: 95.8 fL (ref 80.0–100.0)
Platelets: 153 10*3/uL (ref 150–400)
RBC: 3.58 MIL/uL — ABNORMAL LOW (ref 3.87–5.11)
RDW: 14.2 % (ref 11.5–15.5)
WBC: 4.8 10*3/uL (ref 4.0–10.5)
nRBC: 0 % (ref 0.0–0.2)

## 2022-09-06 LAB — BASIC METABOLIC PANEL
Anion gap: 3 — ABNORMAL LOW (ref 5–15)
BUN: 26 mg/dL — ABNORMAL HIGH (ref 8–23)
CO2: 34 mmol/L — ABNORMAL HIGH (ref 22–32)
Calcium: 8.2 mg/dL — ABNORMAL LOW (ref 8.9–10.3)
Chloride: 102 mmol/L (ref 98–111)
Creatinine, Ser: 0.44 mg/dL (ref 0.44–1.00)
GFR, Estimated: >60 mL/min (ref >60)
Glucose, Bld: 109 mg/dL — ABNORMAL HIGH (ref 70–99)
Potassium: 4.4 mmol/L (ref 3.5–5.1)
Sodium: 139 mmol/L (ref 135–145)

## 2022-09-06 MED ORDER — CEPHALEXIN 250 MG/5ML PO SUSR: 500.0000 mg | Freq: Three times a day (TID) | ORAL | 0 refills | Status: AC

## 2022-09-06 MED ORDER — DOXYCYCLINE CALCIUM 50 MG/5ML PO SYRP: 100.0000 mg | ORAL_SOLUTION | Freq: Two times a day (BID) | ORAL | 0 refills | Status: AC

## 2022-09-06 NOTE — Care Management Important Message (Signed)
Important Message  Patient Details  Name: Tasha Moss MRN: [id number] Date of Birth: 07-29-1936   Medicare Important Message Given:  Yes     Tommy Medal 09/06/2022, 11:16 AM

## 2022-09-06 NOTE — Progress Notes (Signed)
Physical Therapy Treatment Patient Details Name: Tasha Moss MRN: [id number] DOB: 01/04/1936 Today's Date: 09/06/2022   History of Present Illness Tasha Moss is a 86 y.o. female with medical history significant of hypertension, hyperlipidemia, CHF, hypothyroidism status post partial thyroidectomy, GERD, esophageal stricture (2011) who presents to the emergency department accompanied by daughter due to nausea, worsening weakness and worsening rash in buttock area.  She complained of simvastatin about 10 days ago and she presented to the ED on 9/17 during which blood work-up and CT scan done showed no acute pathology, however, she was diagnosed to have cellulitis of the buttock and was discharged with Keflex.  Patient's weakness worsened to the extent that she was unable to ambulate today (she was able to walk as of 2 days ago), so it was decided for her to go to the ED for further evaluation and management.    PT Comments    Patient seated in chair at beginning of session. Patient completes seated exercises with good mechanics in chair. She is able to transfer to standing with labored mobility and use of RW. Patient ambulates increased distance with RW without loss of balance and returns to chair at end of session. Patient will benefit from continued skilled physical therapy in hospital and recommended venue below to increase strength, balance, endurance for safe ADLs and gait.    Recommendations for follow up therapy are one component of a multi-disciplinary discharge planning process, led by the attending physician.  Recommendations may be updated based on patient status, additional functional criteria and insurance authorization.  Follow Up Recommendations  Home health PT     Assistance Recommended at Discharge Set up Supervision/Assistance  Patient can return home with the following A little help with walking and/or transfers;A little help with bathing/dressing/bathroom;Assistance with  cooking/housework;Help with stairs or ramp for entrance   Equipment Recommendations  None recommended by PT    Recommendations for Other Services       Precautions / Restrictions Precautions Precautions: Fall Restrictions Weight Bearing Restrictions: No     Mobility  Bed Mobility                    Transfers Overall transfer level: Needs assistance Equipment used: Rolling walker (2 wheels) Transfers: Sit to/from Stand, Bed to chair/wheelchair/BSC Sit to Stand: Supervision, Min guard   Step pivot transfers: Min guard       General transfer comment: slow movement with RW, increased time    Ambulation/Gait Ambulation/Gait assistance: Modified independent (Device/Increase time), Supervision Gait Distance (Feet): 80 Feet Assistive device: Rolling walker (2 wheels) Gait Pattern/deviations: Step-through pattern, Decreased step length - right, Decreased step length - left, Decreased stride length Gait velocity: decreased     General Gait Details: slow cadence with RW, no loss of balance   Stairs             Wheelchair Mobility    Modified Rankin (Stroke Patients Only)       Balance Overall balance assessment: Needs assistance Sitting-balance support: Bilateral upper extremity supported, Feet supported Sitting balance-Leahy Scale: Good Sitting balance - Comments: seated EOB   Standing balance support: Bilateral upper extremity supported, During functional activity, Reliant on assistive device for balance Standing balance-Leahy Scale: Fair Standing balance comment: fair/good using RW                            Cognition Arousal/Alertness: Awake/alert Behavior During Therapy: WFL for tasks assessed/performed  Overall Cognitive Status: Within Functional Limits for tasks assessed                                          Exercises General Exercises - Lower Extremity Long Arc Quad: AROM, Both, 15 reps, Seated Hip  Flexion/Marching: AROM, Both, 15 reps, Seated Toe Raises: AROM, Both, 15 reps, Seated Heel Raises: AROM, Both, 15 reps, Seated    General Comments        Pertinent Vitals/Pain Pain Assessment Pain Assessment: No/denies pain    Home Living                          Prior Function            PT Goals (current goals can now be found in the care plan section) Acute Rehab PT Goals Patient Stated Goal: return home with spouse to assist PT Goal Formulation: With patient/family Time For Goal Achievement: 09/18/22 Potential to Achieve Goals: Good Progress towards PT goals: Progressing toward goals    Frequency    Min 3X/week      PT Plan Current plan remains appropriate    Co-evaluation              AM-PAC PT "6 Clicks" Mobility   Outcome Measure  Help needed turning from your back to your side while in a flat bed without using bedrails?: None Help needed moving from lying on your back to sitting on the side of a flat bed without using bedrails?: None Help needed moving to and from a bed to a chair (including a wheelchair)?: A Little Help needed standing up from a chair using your arms (e.g., wheelchair or bedside chair)?: A Little Help needed to walk in hospital room?: A Little Help needed climbing 3-5 steps with a railing? : A Lot 6 Click Score: 19    End of Session   Activity Tolerance: Patient tolerated treatment well;Patient limited by fatigue Patient left: in chair;with call bell/phone within reach;with family/visitor present Nurse Communication: Mobility status PT Visit Diagnosis: Unsteadiness on feet (R26.81);Other abnormalities of gait and mobility (R26.89);Muscle weakness (generalized) (M62.81)     Time: 8828-0034 PT Time Calculation (min) (ACUTE ONLY): 12 min  Charges:  $Therapeutic Activity: 8-22 mins                     12:04 PM, 09/06/22 Mearl Latin PT, DPT Physical Therapist at [facility]

## 2022-09-06 NOTE — Discharge Summary (Signed)
Physician Discharge Summary  DERYA DETTMANN LDJ:[id number] DOB: 1936/01/22 DOA: 09/02/2022  PCP: [name], MD  Admit date: 09/02/2022 Discharge date: 09/06/2022  Admitted From: Home Disposition:  Home  Discharge Condition:Stable CODE STATUS:FULL Diet recommendation: Heart Healthy /  Brief/Interim Summary:  Patient is a 86 year old female with history of hypertension, hyperlipidemia, CHF, hypothyroidism, status post partial thyroidectomy, GERD, esophageal stricture who presented to the emergency department with complaint of nausea, vomiting, weakness, rash on the buttock area.  She was recently seen in ED on 9/17 and was diagnosed to have cellulitis of the buttock and was discharged on Keflex.  Patient's weakness worsened and she was unable to ambulate and decided to come to the emergency department.  On presentation, patient was hemodynamically stable.  Patient was started on IV antibiotics for cellulitis.  Cellulitic area has significantly improved.  Redness has improved, pain has resolved.  PT/OT recommending home health.  Plan for discharge today with oral antibiotics.  Following problems were addressed during her hospitalization:   Cellulitis/rash of buttocks/posterior bilateral thigh with pressure 1 ulcer: Was recently seen in the emergency department and was prescribed oral antibiotics with did not help.  Patient presented with weakness.  Hemodynamically stable on presentation, afebrile, no leukocytosis.  CT abdomen/pelvis done, did not show any acute findings.  Wound care consulted..Cultures NGTD.  Cellulitis area looks much better today.  Redness has improved.Plan  for discharge to home with oral antibiotics   Nausea/vomiting:   Abdomen CT did not show any acute findings.Continue zofran   Generalized weakness/deconditioning: Home health recommended by PT/OT.   Dehydration: Treated with gentle IV fluids.  IV fluids stopped   Hypertension: Currently blood pressure stable.  On  propranolol   History of GERD/esophageal stricture: Continue PPI, antiemetics   Acquired  hypothyroidism: Continue levothyroxine   Chronic diastolic CHF: Echo done on February 2 130 showed EF of 65%, no wall motion abnormality, grade 1 diastolic dysfunction Currently euvolemic   Status post G-tube: Continue G-tube feeding.  Nutritionist consulted and was following  Discharge Diagnoses:  Principal Problem:   Cellulitis of buttock Active Problems:   Essential hypertension   Stricture and stenosis of esophagus   GERD (gastroesophageal reflux disease)   Nausea   Generalized weakness   Dehydration   Feeding by G-tube [facility])    Discharge Instructions  Discharge Instructions     Diet general   Complete by: As directed    Tube diet   Discharge instructions   Complete by: As directed    1)Please take prescribed medications as instructed 2)Follow up with your PCP in a week   Discharge wound care:   Complete by: As directed    As per wound care nurse   Increase activity slowly   Complete by: As directed       Allergies as of 09/06/2022       Reactions   Gabapentin Nausea And Vomiting   hallucinations hallucinations   Other Nausea And Vomiting   Strong pain medications cause n/v Strong pain medications cause n/v   Morphine Nausea And Vomiting   Nausea & vomiting Nausea & vomiting        Medication List     STOP taking these medications    ondansetron 4 MG disintegrating tablet Commonly known as: ZOFRAN-ODT   predniSONE 10 MG tablet Commonly known as: DELTASONE       TAKE these medications    acetaminophen 500 MG tablet Commonly known as: TYLENOL Take 500 mg by mouth every  6 (six) hours as needed for moderate pain.   Artificial Tears 5-6 MG/ML Soln Generic drug: Polyvinyl Alcohol-Povidone Place 2 drops into both eyes daily.   cephALEXin 250 MG/5ML suspension Commonly known as: KEFLEX Take 10 mLs (500 mg total) by mouth 3 (three) times daily for 3  days. Start taking on: September 07, 2022 What changed:  how to take this when to take this   diclofenac Sodium 1 % Gel Commonly known as: VOLTAREN Apply 2 g topically 4 (four) times daily as needed (Pain).   doxycycline 50 MG/5ML Syrp Commonly known as: VIBRAMYCIN Place 10 mLs (100 mg total) into feeding tube 2 (two) times daily for 3 days. Start taking on: September 07, 2022   esomeprazole 40 MG capsule Commonly known as: [place] 1 CAPSULE IN FEEDING TUBE TWICE DAILY. What changed: additional instructions   feeding supplement (PROSource TF) liquid Place 45 mLs into feeding tube daily. What changed: Another medication with the same name was removed. Continue taking this medication, and follow the directions you see here.   feeding supplement (JEVITY 1.5 CAL/FIBER) Liqd Place 50 mL/hr into feeding tube continuous. Dose to be titrated per RD at home. What changed: Another medication with the same name was removed. Continue taking this medication, and follow the directions you see here.   FLUoxetine 20 MG capsule Commonly known as: PROZAC TAKE 1 CAPSULE BY MOUTH DAILY.   fluticasone 50 MCG/ACT nasal spray Commonly known as: FLONASE Place 1 spray into both nostrils daily.   free water Soln Place 100 mLs into feeding tube 6 (six) times daily.   levothyroxine 125 MCG tablet Commonly known as: SYNTHROID Take 1 tablet (125 mcg total) by mouth daily before breakfast. What changed: Another medication with the same name was removed. Continue taking this medication, and follow the directions you see here.   loratadine 10 MG tablet Commonly known as: CLARITIN Take 10 mg by mouth daily.   meloxicam 7.5 MG tablet Commonly known as: MOBIC TAKE (1) TABLET BY MOUTH TWICE DAILY.   ondansetron 8 MG tablet Commonly known as: ZOFRAN Take 1 tablet (8 mg total) by mouth 3 (three) times daily before meals. What changed:  when to take this reasons to take this   propranolol ER  60 MG 24 hr capsule Commonly known as: INDERAL LA Take 60 mg by mouth daily.   triamcinolone ointment 0.1 % Commonly known as: KENALOG Apply 1 application. topically 2 (two) times daily.   UNABLE TO FIND TUBE FEEDS: 1/2 carton (118 ml) Jevity 1.5 QID with 60 ml free water before and 60 ml free water after each TF bolus and 45 ml Prosource TF (or equivalent) BID.    Maintain this regimen x4 days.    Then increase to 1 carton (237 ml) Jevity 1.5 QID with 60 ml free water before and 60 ml free water after each TF bolus and 45 ml Prosource TF (or equivalent) BID.    This goal regimen will provide 1500 kcal (86% kcal need), 82 grams protein (96% protein need), and 1200 ml free water.   vitamin B-12 250 MCG tablet Commonly known as: CYANOCOBALAMIN Take 250 mcg by mouth daily.               Discharge Care Instructions  (From admission, onward)           Start     Ordered   09/06/22 0000  Discharge wound care:       Comments: As per wound care  nurse   09/06/22 1031            Follow-up Information     [name], MD. Schedule an appointment as soon as possible for a visit in 1 week(s).   Specialty: Family Medicine Contact information: [phone] [place] [address] [phone]                Allergies  Allergen Reactions   Gabapentin Nausea And Vomiting    hallucinations hallucinations   Other Nausea And Vomiting    Strong pain medications cause n/v Strong pain medications cause n/v   Morphine Nausea And Vomiting    Nausea & vomiting Nausea & vomiting    Consultations: None   Procedures/Studies: CT ABDOMEN PELVIS W CONTRAST  Result Date: 08/25/2022 CLINICAL DATA:  Nausea and vomiting EXAM: CT ABDOMEN AND PELVIS WITH CONTRAST TECHNIQUE: Multidetector CT imaging of the abdomen and pelvis was performed using the standard protocol following bolus administration of intravenous contrast. RADIATION DOSE REDUCTION: This exam was  performed according to the departmental dose-optimization program which includes automated exposure control, adjustment of the mA and/or kV according to patient size and/or use of iterative reconstruction technique. CONTRAST:  134m OMNIPAQUE IOHEXOL 300 MG/ML  SOLN COMPARISON:  01/07/2022 FINDINGS: Lower chest: Minimal right lower lobe scarring is noted. Hepatobiliary: No focal liver abnormality is seen. No gallstones, gallbladder wall thickening, or biliary dilatation. Pancreas: Unremarkable. No pancreatic ductal dilatation or surrounding inflammatory changes. Spleen: Calcified granulomas are noted. Adrenals/Urinary Tract: Right adrenal gland is within normal limits. Stable nodularity of the left adrenal gland is noted. Kidneys demonstrate a normal enhancement pattern bilaterally. Tiny nonobstructing stones are noted in the lower poles bilaterally. The ureters are within normal limits. Bladder is well distended. Stomach/Bowel: Scattered diverticular change of the colon is noted without evidence of diverticulitis. Gastrostomy catheter is noted in place. Appendix is not well visualized. No inflammatory changes to suggest appendicitis are noted. Small bowel and stomach are unremarkable. Vascular/Lymphatic: Aortic atherosclerosis. No enlarged abdominal or pelvic lymph nodes. Reproductive: Uterus is retroflexed but otherwise within normal limits. No adnexal mass is noted. Other: No abdominal wall hernia or abnormality. No abdominopelvic ascites. Musculoskeletal: Bilateral hip replacements are noted. Degenerative changes of lumbar spine are seen. IMPRESSION: Bilateral nonobstructing renal calculi. Diverticulosis without diverticulitis. Stable nodularity of the left adrenal gland measuring less than 1 cm. No further follow-up is recommended. Electronically Signed   By: MInez CatalinaM.D.   On: 08/25/2022 21:19   DG Chest Port 1 View  Result Date: 08/25/2022 CLINICAL DATA:  Vomiting. EXAM: PORTABLE CHEST 1 VIEW  COMPARISON:  Chest radiograph dated 02/18/2022. FINDINGS: The heart size and mediastinal contours are within normal limits. Vascular calcifications are seen in the aortic arch. Both lungs are clear. A left shoulder arthroplasty is noted. IMPRESSION: No active disease. Aortic Atherosclerosis (ICD10-I70.0). Electronically Signed   By: TZerita BoersM.D.   On: 08/25/2022 19:41      Subjective: Patient seen and examined at the bedside today.  Hemodynamically stable for discharge.  I discussed the discharge planning with the husband at bedside and daughter on phone  Discharge Exam: Vitals:   09/05/22 2009 09/06/22 0603  BP: (!) 113/59 123/62  Pulse: 63 65  Resp:  20  Temp: 98.5 F (36.9 C) 97.7 F (36.5 C)  SpO2: 95% 93%   Vitals:   09/05/22 1317 09/05/22 2009 09/06/22 0500 09/06/22 0603  BP: (!) 116/51 (!) 113/59  123/62  Pulse: 62 63  65  Resp: (!) 21   20  Temp: 97.6 F (36.4 C) 98.5 F (36.9 C)  97.7 F (36.5 C)  TempSrc: Oral Oral  Oral  SpO2: 96% 95%  93%  Weight:   59.8 kg   Height:        General: Pt is alert, awake, not in acute distress Cardiovascular: RRR, S1/S2 +, no rubs, no gallops Respiratory: CTA bilaterally, no wheezing, no rhonchi Abdominal: Soft, NT, ND, bowel sounds + Extremities: no edema, no cyanosis Skin: Some erythema on the buttocks bilaterally    The results of significant diagnostics from this hospitalization (including imaging, microbiology, ancillary and laboratory) are listed below for reference.     Microbiology: Recent Results (from the past 240 hour(s))  Resp Panel by RT-PCR (Flu A&B, Covid) Anterior Nasal Swab     Status: None   Collection Time: 09/02/22 10:45 PM   Specimen: Anterior Nasal Swab  Result Value Ref Range Status   SARS Coronavirus 2 by RT PCR NEGATIVE NEGATIVE Final    Comment: (NOTE) SARS-CoV-2 target nucleic acids are NOT DETECTED.  The SARS-CoV-2 RNA is generally detectable in upper respiratory specimens during the  acute phase of infection. The lowest concentration of SARS-CoV-2 viral copies this assay can detect is 138 copies/mL. A negative result does not preclude SARS-Cov-2 infection and should not be used as the sole basis for treatment or other patient management decisions. A negative result may occur with  improper specimen collection/handling, submission of specimen other than nasopharyngeal swab, presence of viral mutation(s) within the areas targeted by this assay, and inadequate number of viral copies(<138 copies/mL). A negative result must be combined with clinical observations, patient history, and epidemiological information. The expected result is Negative.  Fact Sheet for Patients:  EntrepreneurPulse.com.au  Fact Sheet for Healthcare Providers:  IncredibleEmployment.be  This test is no t yet approved or cleared by the [place] FDA and  has been authorized for detection and/or diagnosis of SARS-CoV-2 by FDA under an Emergency Use Authorization (EUA). This EUA will remain  in effect (meaning this test can be used) for the duration of the COVID-19 declaration under Section 564(b)(1) of the Act, 21 U.S.C.section 360bbb-3(b)(1), unless the authorization is terminated  or revoked sooner.       Influenza A by PCR NEGATIVE NEGATIVE Final   Influenza B by PCR NEGATIVE NEGATIVE Final    Comment: (NOTE) The Xpert Xpress SARS-CoV-2/FLU/RSV plus assay is intended as an aid in the diagnosis of influenza from Nasopharyngeal swab specimens and should not be used as a sole basis for treatment. Nasal washings and aspirates are unacceptable for Xpert Xpress SARS-CoV-2/FLU/RSV testing.  Fact Sheet for Patients: EntrepreneurPulse.com.au  Fact Sheet for Healthcare Providers: IncredibleEmployment.be  This test is not yet approved or cleared by the [place] FDA and has been authorized for detection and/or  diagnosis of SARS-CoV-2 by FDA under an Emergency Use Authorization (EUA). This EUA will remain in effect (meaning this test can be used) for the duration of the COVID-19 declaration under Section 564(b)(1) of the Act, 21 U.S.C. section 360bbb-3(b)(1), unless the authorization is terminated or revoked.  Performed at [facility], [address] [place], [place] [address]   Culture, blood (Routine X 2) w Reflex to ID Panel     Status: None (Preliminary result)   Collection Time: 09/04/22 12:42 PM   Specimen: BLOOD RIGHT ARM  Result Value Ref Range Status   Specimen Description BLOOD RIGHT ARM  Final   Special Requests   Final  BOTTLES DRAWN AEROBIC AND ANAEROBIC Blood Culture results may not be optimal due to an excessive volume of blood received in culture bottles   Culture   Final    NO GROWTH 2 DAYS Performed at [facility], [address] [place], [place] [address]    Report Status PENDING  Incomplete  Culture, blood (Routine X 2) w Reflex to ID Panel     Status: None (Preliminary result)   Collection Time: 09/04/22 12:42 PM   Specimen: BLOOD LEFT ARM  Result Value Ref Range Status   Specimen Description BLOOD LEFT ARM  Final   Special Requests   Final    BOTTLES DRAWN AEROBIC AND ANAEROBIC Blood Culture adequate volume   Culture   Final    NO GROWTH 2 DAYS Performed at [facility], [address], [place], [place] [address]    Report Status PENDING  Incomplete     Labs: BNP (last 3 results) Recent Labs    01/07/22 1534 01/08/22 0805  BNP 113.5* 88.8   Basic Metabolic Panel: Recent Labs  Lab 09/02/22 1915 09/03/22 0525 09/04/22 0903 09/06/22 0624  NA 137 138 139 139  K 4.4 3.9 4.3 4.4  CL 101 106 106 102  CO2 '29 26 30 '$ 34*  GLUCOSE 130* 101* 97 109*  BUN 14 14 24* 26*  CREATININE 0.48 0.50 0.45 0.44  CALCIUM 8.7* 8.4* 8.1* 8.2*  MG 1.9  --  1.8  --   PHOS  --  3.7 3.1  --    Liver Function Tests: Recent Labs  Lab 09/02/22 1915 09/03/22 0525  09/04/22 0903  AST '22 19 18  '$ ALT '21 17 18  '$ ALKPHOS 94 77 84  BILITOT 0.7 0.5 0.6  PROT 6.6 5.9* 5.9*  ALBUMIN 3.4* 2.9* 2.9*   No results for input(s): "LIPASE", "AMYLASE" in the last 168 hours. No results for input(s): "AMMONIA" in the last 168 hours. CBC: Recent Labs  Lab 09/02/22 1915 09/03/22 0525 09/04/22 0903 09/06/22 0624  WBC 9.0 5.4 5.4 4.8  NEUTROABS 8.1*  --  3.8  --   HGB 13.4 12.0 12.4 11.2*  HCT 40.2 36.6 38.5 34.3*  MCV 94.8 95.1 96.5 95.8  PLT 194 176 189 153   Cardiac Enzymes: Recent Labs  Lab 09/02/22 1915  CKTOTAL 24*   BNP: Invalid input(s): "POCBNP" CBG: No results for input(s): "GLUCAP" in the last 168 hours. D-Dimer No results for input(s): "DDIMER" in the last 72 hours. Hgb A1c No results for input(s): "HGBA1C" in the last 72 hours. Lipid Profile No results for input(s): "CHOL", "HDL", "LDLCALC", "TRIG", "CHOLHDL", "LDLDIRECT" in the last 72 hours. Thyroid function studies No results for input(s): "TSH", "T4TOTAL", "T3FREE", "THYROIDAB" in the last 72 hours.  Invalid input(s): "FREET3" Anemia work up No results for input(s): "VITAMINB12", "FOLATE", "FERRITIN", "TIBC", "IRON", "RETICCTPCT" in the last 72 hours. Urinalysis    Component Value Date/Time   COLORURINE YELLOW 09/02/2022 2109   APPEARANCEUR CLEAR 09/02/2022 2109   LABSPEC 1.013 09/02/2022 2109   PHURINE 8.0 09/02/2022 2109   GLUCOSEU NEGATIVE 09/02/2022 2109   HGBUR NEGATIVE 09/02/2022 2109   BILIRUBINUR NEGATIVE 09/02/2022 2109   BILIRUBINUR negative 12/20/2019 1413   [place] NEGATIVE 09/02/2022 2109   PROTEINUR NEGATIVE 09/02/2022 2109   UROBILINOGEN 0.2 12/20/2019 1413   UROBILINOGEN 0.2 07/16/2010 0038   NITRITE NEGATIVE 09/02/2022 2109   LEUKOCYTESUR NEGATIVE 09/02/2022 2109   Sepsis Labs Recent Labs  Lab 09/02/22 1915 09/03/22 0525 09/04/22 0903 09/06/22 0624  WBC 9.0  5.4 5.4 4.8   Microbiology Recent Results (from the past 240 hour(s))  Resp Panel by  RT-PCR (Flu A&B, Covid) Anterior Nasal Swab     Status: None   Collection Time: 09/02/22 10:45 PM   Specimen: Anterior Nasal Swab  Result Value Ref Range Status   SARS Coronavirus 2 by RT PCR NEGATIVE NEGATIVE Final    Comment: (NOTE) SARS-CoV-2 target nucleic acids are NOT DETECTED.  The SARS-CoV-2 RNA is generally detectable in upper respiratory specimens during the acute phase of infection. The lowest concentration of SARS-CoV-2 viral copies this assay can detect is 138 copies/mL. A negative result does not preclude SARS-Cov-2 infection and should not be used as the sole basis for treatment or other patient management decisions. A negative result may occur with  improper specimen collection/handling, submission of specimen other than nasopharyngeal swab, presence of viral mutation(s) within the areas targeted by this assay, and inadequate number of viral copies(<138 copies/mL). A negative result must be combined with clinical observations, patient history, and epidemiological information. The expected result is Negative.  Fact Sheet for Patients:  EntrepreneurPulse.com.au  Fact Sheet for Healthcare Providers:  IncredibleEmployment.be  This test is no t yet approved or cleared by the [place] FDA and  has been authorized for detection and/or diagnosis of SARS-CoV-2 by FDA under an Emergency Use Authorization (EUA). This EUA will remain  in effect (meaning this test can be used) for the duration of the COVID-19 declaration under Section 564(b)(1) of the Act, 21 U.S.C.section 360bbb-3(b)(1), unless the authorization is terminated  or revoked sooner.       Influenza A by PCR NEGATIVE NEGATIVE Final   Influenza B by PCR NEGATIVE NEGATIVE Final    Comment: (NOTE) The Xpert Xpress SARS-CoV-2/FLU/RSV plus assay is intended as an aid in the diagnosis of influenza from Nasopharyngeal swab specimens and should not be used as a sole basis for  treatment. Nasal washings and aspirates are unacceptable for Xpert Xpress SARS-CoV-2/FLU/RSV testing.  Fact Sheet for Patients: EntrepreneurPulse.com.au  Fact Sheet for Healthcare Providers: IncredibleEmployment.be  This test is not yet approved or cleared by the [place] FDA and has been authorized for detection and/or diagnosis of SARS-CoV-2 by FDA under an Emergency Use Authorization (EUA). This EUA will remain in effect (meaning this test can be used) for the duration of the COVID-19 declaration under Section 564(b)(1) of the Act, 21 U.S.C. section 360bbb-3(b)(1), unless the authorization is terminated or revoked.  Performed at [facility], [address] [place], [place] [address]   Culture, blood (Routine X 2) w Reflex to ID Panel     Status: None (Preliminary result)   Collection Time: 09/04/22 12:42 PM   Specimen: BLOOD RIGHT ARM  Result Value Ref Range Status   Specimen Description BLOOD RIGHT ARM  Final   Special Requests   Final    BOTTLES DRAWN AEROBIC AND ANAEROBIC Blood Culture results may not be optimal due to an excessive volume of blood received in culture bottles   Culture   Final    NO GROWTH 2 DAYS Performed at [facility], [address], [place], [place] [address]    Report Status PENDING  Incomplete  Culture, blood (Routine X 2) w Reflex to ID Panel     Status: None (Preliminary result)   Collection Time: 09/04/22 12:42 PM   Specimen: BLOOD LEFT ARM  Result Value Ref Range Status   Specimen Description BLOOD LEFT ARM  Final   Special Requests   Final  BOTTLES DRAWN AEROBIC AND ANAEROBIC Blood Culture adequate volume   Culture   Final    NO GROWTH 2 DAYS Performed at [facility], [address], [place], [place] [address]    Report Status PENDING  Incomplete    Please note: You were cared for by a hospitalist during your hospital stay. Once you are discharged, your primary care physician will handle any  further medical issues. Please note that NO REFILLS for any discharge medications will be authorized once you are discharged, as it is imperative that you return to your primary care physician (or establish a relationship with a primary care physician if you do not have one) for your post hospital discharge needs so that they can reassess your need for medications and monitor your lab values.    Time coordinating discharge: 40 minutes  SIGNED:   Shelly Coss, MD  [facility] 09/06/2022, 10:32 AM Pager [phone]  If 7PM-7AM, please contact night-coverage [email or web address] Password [email or web address]

## 2022-09-08 DIAGNOSIS — R6339 Other feeding difficulties: Secondary | ICD-10-CM | POA: Diagnosis not present

## 2022-09-08 DIAGNOSIS — R627 Adult failure to thrive: Secondary | ICD-10-CM | POA: Diagnosis not present

## 2022-09-08 DIAGNOSIS — E440 Moderate protein-calorie malnutrition: Secondary | ICD-10-CM | POA: Diagnosis not present

## 2022-09-09 ENCOUNTER — Other Ambulatory Visit: Payer: Self-pay | Admitting: Family Medicine

## 2022-09-09 ENCOUNTER — Telehealth: Payer: Self-pay

## 2022-09-09 ENCOUNTER — Other Ambulatory Visit: Payer: Self-pay

## 2022-09-09 DIAGNOSIS — K219 Gastro-esophageal reflux disease without esophagitis: Secondary | ICD-10-CM | POA: Diagnosis not present

## 2022-09-09 DIAGNOSIS — I509 Heart failure, unspecified: Secondary | ICD-10-CM

## 2022-09-09 DIAGNOSIS — L03317 Cellulitis of buttock: Secondary | ICD-10-CM | POA: Diagnosis not present

## 2022-09-09 DIAGNOSIS — I110 Hypertensive heart disease with heart failure: Secondary | ICD-10-CM | POA: Diagnosis not present

## 2022-09-09 DIAGNOSIS — I10 Essential (primary) hypertension: Secondary | ICD-10-CM

## 2022-09-09 DIAGNOSIS — K222 Esophageal obstruction: Secondary | ICD-10-CM | POA: Diagnosis not present

## 2022-09-09 DIAGNOSIS — Z931 Gastrostomy status: Secondary | ICD-10-CM | POA: Diagnosis not present

## 2022-09-09 DIAGNOSIS — Z791 Long term (current) use of non-steroidal anti-inflammatories (NSAID): Secondary | ICD-10-CM | POA: Diagnosis not present

## 2022-09-09 DIAGNOSIS — M1990 Unspecified osteoarthritis, unspecified site: Secondary | ICD-10-CM | POA: Diagnosis not present

## 2022-09-09 DIAGNOSIS — E782 Mixed hyperlipidemia: Secondary | ICD-10-CM | POA: Diagnosis not present

## 2022-09-09 DIAGNOSIS — I5032 Chronic diastolic (congestive) heart failure: Secondary | ICD-10-CM | POA: Diagnosis not present

## 2022-09-09 DIAGNOSIS — E890 Postprocedural hypothyroidism: Secondary | ICD-10-CM | POA: Diagnosis not present

## 2022-09-09 DIAGNOSIS — L89891 Pressure ulcer of other site, stage 1: Secondary | ICD-10-CM | POA: Diagnosis not present

## 2022-09-09 LAB — CULTURE, BLOOD (ROUTINE X 2)
Culture: NO GROWTH
Culture: NO GROWTH
Special Requests: ADEQUATE

## 2022-09-09 NOTE — Chronic Care Management (AMB) (Signed)
  Care Coordination   Note   09/09/2022 Name: Tasha Moss MRN: [id number] DOB: 01-29-1936  Tasha Moss is a 86 y.o. year old female who sees Tabori, Aundra Millet, MD for primary care. I reached out to Cherlynn Perches by phone today to offer care coordination services.  Ms. Oloughlin was given information about Care Coordination services today including:   The Care Coordination services include support from the care team which includes your Nurse Coordinator, Clinical Social Worker, or Pharmacist.  The Care Coordination team is here to help remove barriers to the health concerns and goals most important to you. Care Coordination services are voluntary, and the patient may decline or stop services at any time by request to their care team member.   Care Coordination Consent Status: Patient agreed to services and verbal consent obtained.   Follow up plan:  Telephone appointment with care coordination team member scheduled for:  09/12/2022  Encounter Outcome:  Pt. Scheduled  Noreene Larsson, [place], [place] [address] Direct Dial: [phone] [email or web address]

## 2022-09-09 NOTE — Patient Outreach (Signed)
Tasha Moss 30-Jun-1936 [id number]   [place] Organization [ACO] Patient:  [name] Liaison remote coverage for review and referral patient at [facility] prior to discharge 09/06/22  Primary Care Provider: [name], MD, [place] at [facility] is listed for the transition of care follow up needs.  Patient is high risk for unplanned readmission score.  Referral for post hospital follow up for care coordination.  [name], RN BSN [name]  [phone] business mobile phone Toll free office [phone]  *[place]  [phone] Fax number: [phone] [name]

## 2022-09-09 NOTE — Patient Outreach (Signed)
ENRIQUETA AUGUSTA [birth year] [id number]   [place] Organization [ACO] Patient: [employer]  Primary Care Provider: [name], MD, [place] at [facility]  Referral for post hospital follow up, high risk readmission prevention.  [name], RN BSN [place]  [phone] business mobile phone Toll free office [phone]  *[place]  [phone] Fax number: [phone] [email or web address]

## 2022-09-11 ENCOUNTER — Telehealth: Payer: Self-pay

## 2022-09-11 NOTE — Patient Instructions (Signed)
Visit Information  Thank you for taking time to visit with me today. Please don't hesitate to contact me if I can be of assistance to you.      Your next appointment is by telephone on 09/12/22 at 3pm  Please call the care guide team at [phone] if you need to cancel or reschedule your appointment.   If you are experiencing a Mental Health or [place] or need someone to talk to, please call the [place] National Suicide Prevention Lifeline: 6476807826 or TTY: (606)619-3712 TTY [phone]) to talk to a trained counselor  Patient verbalizes understanding of instructions and care plan provided today and agrees to view in [place]. Active MyChart status and patient understanding of how to access instructions and care plan via MyChart confirmed with patient.     The patient has been provided with contact information for the care management team and has been advised to call with any health related questions or concerns.

## 2022-09-11 NOTE — Patient Outreach (Addendum)
  Care Coordination TOC Note Transition Care Management Follow-up Telephone Call Date of discharge and from where: 09/06/22-[facility] How have you been since you were released from the hospital? Patient reports that she doesn't feel as weak-feels like she is getting stronger daily. She reports she still has some redness and soreness to her bottom area. States areas has been slow to heal as she has been dealing with it for several months. Patient voices she has completed abx therapy.  Any questions or concerns? Yes-patient states that thyroid medicine dosage changed from 112 mcg to 125 mcg. She gets meds from [place] and they did not deliver the correct dosage. Pharmacy says she need new script with new dosage on med. Offered to contact PCP office for patient but she voiced that her and her husband would call office as they needed to call to make follow up appt as well.  Items Reviewed: Did the pt receive and understand the discharge instructions provided? Yes  Medications obtained and verified? Yes  Other? No  Any new allergies since your discharge? No  Dietary orders reviewed? Yes-patient has G-tube. States tube feedings going well and no issues. Do you have support at home? Yes -supportive spouse and dtr able to assist  Home Care and Equipment/Supplies: Were home health services ordered? yes If so, what is the name of the agency? [facility]  Has the agency set up a time to come to the patient's home? yes Were any new equipment or medical supplies ordered?  No What is the name of the medical supply agency? N/a Were you able to get the supplies/equipment? not applicable Do you have any questions related to the use of the equipment or supplies? No  Functional Questionnaire: (I = Independent and D = Dependent) ADLs: Assist  Bathing/Dressing- Assist  Meal Prep- Assist  Eating- I  Maintaining continence- Assist  Transferring/Ambulation- Assist  Managing Meds-  Assist  Follow up appointments reviewed:  PCP Hospital f/u appt confirmed?  No appt yet-offered to assist with scheduling but declined states they will call office ASAP today  . [facility] f/u appt confirmed?  N/A  . Are transportation arrangements needed? No  If their condition worsens, is the pt aware to call PCP or go to the Emergency Dept.? Yes Was the patient provided with contact information for the PCP's office or ED? Yes Was  pt encouraged to call back with questions or concerns? Yes  SDOH assessments and interventions completed:   Yes  Care Coordination Interventions Activated:  Yes   Care Coordination Interventions:  Pt already has an appt with RN CM for 09/12/22-3pm. Also instructed patient to call [facility] to advise them of discharge and schedule home visit.     Encounter Outcome:  Pt. Visit Completed     Enzo Montgomery, RN,BSN,CCM [place] Management Telephonic Care Management Coordinator Direct Phone: [phone] Toll Free: [phone] Fax: [phone]

## 2022-09-12 ENCOUNTER — Encounter: Payer: Self-pay | Admitting: *Deleted

## 2022-09-12 ENCOUNTER — Ambulatory Visit: Payer: Self-pay | Admitting: *Deleted

## 2022-09-12 NOTE — Patient Outreach (Signed)
  Care Coordination   Initial Visit Note   09/12/2022 Name: BOBBETTE EAKES MRN: [id number] DOB: March 25, 1936  KYMBERLEE VIGER is a 86 y.o. year old female who sees Tabori, Aundra Millet, MD for primary care. I spoke with  Cherlynn Perches by phone today.  What matters to the patients health and wellness today?  Maintain health of her cellulitis    Goals Addressed               This Visit's Progress     Maintain healing of her cellulitis (pt-stated)        Care Coordination Interventions: Advised patient to continue applying topical ointment to her cellulitis wound as prescribed (currently scabbed over with no drainage).  Provided education to patient re: infections and to avoid lotions and other products that can cause irritation and/or infections. Reviewed medications with patient and discussed adherence and confirm no needed refills Reviewed scheduled/upcoming provider appointments including pending appointments and verified pt has completed her AWV this year. Discussed plans with patient for ongoing care management follow up and provided patient with direct contact information for care management team Screening for signs and symptoms of depression related to chronic disease state  Assessed social determinant of health barriers Pt reports her admission related to a form of aid reflux due to her ongoing tube feeds via her G-tube. Strongly encouraged pt to utilize her Zofran medicine for nausea/vomiting to avoid severe symptoms (receptive). Pt denies any other issues at this time with controlled HTN/CHF and Palliative services continue to be involved.           SDOH assessments and interventions completed:  Yes  SDOH Interventions Today    Flowsheet Row Most Recent Value  SDOH Interventions   Food Insecurity Interventions Intervention Not Indicated  Housing Interventions Intervention Not Indicated  Transportation Interventions Intervention Not Indicated  Utilities Interventions  Intervention Not Indicated        Care Coordination Interventions Activated:  Yes  Care Coordination Interventions:  Yes, provided   Follow up plan: No further intervention required.   Encounter Outcome:  Pt. Visit Completed   Raina Mina, RN Care Management Coordinator [place] Office [phone]

## 2022-09-12 NOTE — Patient Instructions (Signed)
Visit Information  Thank you for taking time to visit with me today. Please don't hesitate to contact me if I can be of assistance to you.   Following are the goals we discussed today:   Goals Addressed               This Visit's Progress     Maintain healing of her cellulitis (pt-stated)        Care Coordination Interventions: Advised patient to continue applying topical ointment to her cellulitis wound as prescribed (currently scabbed over with no drainage).  Provided education to patient re: infections and to avoid lotions and other products that can cause irritation and/or infections. Reviewed medications with patient and discussed adherence and confirm no needed refills Reviewed scheduled/upcoming provider appointments including pending appointments and verified pt has completed her AWV this year. Discussed plans with patient for ongoing care management follow up and provided patient with direct contact information for care management team Screening for signs and symptoms of depression related to chronic disease state  Assessed social determinant of health barriers Pt reports her admission related to a form of aid reflux due to her ongoing tube feeds via her G-tube. Strongly encouraged pt to utilize her Zofran medicine for nausea/vomiting to avoid severe symptoms (receptive). Pt denies any other issues at this time with controlled HTN/CHF and Palliative services continue to be involved.           Our next appointment is by telephone on 09/26/2022 at 2:00 PM  Please call the care guide team at [phone] if you need to cancel or reschedule your appointment.   If you are experiencing a Mental Health or [place] or need someone to talk to, please call the Suicide and Crisis Lifeline: 988  Patient verbalizes understanding of instructions and care plan provided today and agrees to view in [place]. Active MyChart status and patient understanding of how to access  instructions and care plan via MyChart confirmed with patient.     Telephone follow up appointment with care management team member scheduled for: 09/26/2022 as noted above    Raina Mina, RN Care Management Coordinator [place] Office [phone]

## 2022-09-13 ENCOUNTER — Telehealth (INDEPENDENT_AMBULATORY_CARE_PROVIDER_SITE_OTHER): Payer: Medicare Other | Admitting: Family Medicine

## 2022-09-13 ENCOUNTER — Encounter: Payer: Self-pay | Admitting: Family Medicine

## 2022-09-13 VITALS — Wt 128.0 lb

## 2022-09-13 DIAGNOSIS — K219 Gastro-esophageal reflux disease without esophagitis: Secondary | ICD-10-CM | POA: Diagnosis not present

## 2022-09-13 DIAGNOSIS — Z931 Gastrostomy status: Secondary | ICD-10-CM | POA: Diagnosis not present

## 2022-09-13 DIAGNOSIS — M1990 Unspecified osteoarthritis, unspecified site: Secondary | ICD-10-CM | POA: Diagnosis not present

## 2022-09-13 DIAGNOSIS — E782 Mixed hyperlipidemia: Secondary | ICD-10-CM | POA: Diagnosis not present

## 2022-09-13 DIAGNOSIS — I5032 Chronic diastolic (congestive) heart failure: Secondary | ICD-10-CM | POA: Diagnosis not present

## 2022-09-13 DIAGNOSIS — Z791 Long term (current) use of non-steroidal anti-inflammatories (NSAID): Secondary | ICD-10-CM | POA: Diagnosis not present

## 2022-09-13 DIAGNOSIS — E890 Postprocedural hypothyroidism: Secondary | ICD-10-CM | POA: Diagnosis not present

## 2022-09-13 DIAGNOSIS — L03317 Cellulitis of buttock: Secondary | ICD-10-CM

## 2022-09-13 DIAGNOSIS — R531 Weakness: Secondary | ICD-10-CM

## 2022-09-13 DIAGNOSIS — R110 Nausea: Secondary | ICD-10-CM

## 2022-09-13 DIAGNOSIS — L89891 Pressure ulcer of other site, stage 1: Secondary | ICD-10-CM | POA: Diagnosis not present

## 2022-09-13 DIAGNOSIS — I110 Hypertensive heart disease with heart failure: Secondary | ICD-10-CM | POA: Diagnosis not present

## 2022-09-13 DIAGNOSIS — K222 Esophageal obstruction: Secondary | ICD-10-CM | POA: Diagnosis not present

## 2022-09-13 NOTE — Progress Notes (Signed)
Virtual Visit via Video   I connected with patient on 09/13/22 at  1:20 PM EDT by a video enabled telemedicine application and verified that I am speaking with the correct person using two identifiers.  Location patient: Home Location provider: [facility], Office Persons participating in the virtual visit: Patient, Provider, [place] Marcille Blanco C)  I discussed the limitations of evaluation and management by telemedicine and the availability of in person appointments. The patient expressed understanding and agreed to proceed.  Interactive audio and video telecommunications were attempted between this provider and patient, however failed, due to patient having technical difficulties OR patient did not have access to video capability.  We continued and completed visit with audio only.   Subjective:   HPI:   Hospital f/u- pt was admitted 9/25-9/29 w/ N/V, weakness and cellulitis.  She was started on IV abx.  CT abd/pelvis w/o acute findings.  No leukocytosis on CBC.  IV abx were switched to PO abx at time of d/c- Keflex and Doxycycline.  She was sent home w/ [facility] PT/OT due to generalized weakness.  Daughter reports she is stronger today than she has been but remains weak.  'i'm doing great, but I'm still weak'.  PT came out Monday and they are to return this afternoon.  Buttocks remain red but much improved compared to previous.  Area is not oozing or weeping.  Family is asking for [facility] wound care.  Pt states she is tolerating feeds and nausea is better  ROS:   See pertinent positives and negatives per HPI.  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 ([place]) 11/06/2021   Elevated BP without diagnosis of hypertension 10/31/2020   Dizziness 10/31/2020   Status post reverse arthroplasty of left shoulder 03/11/2016   Depression with anxiety 03/11/2016   Routine general medical examination at a health care facility 06/29/2014   H/O thyroid nodule 06/21/2013   DIVERTICULOSIS, COLON 07/18/2010   Nausea 07/16/2010   GERD (gastroesophageal reflux disease) 04/09/2010   Stricture and stenosis of esophagus 02/21/2010   Dysphagia 02/09/2010   Osteoarthritis 11/22/2009   Benign paroxysmal positional vertigo 05/15/2009   Essential hypertension 09/28/2008   SPINAL STENOSIS, LUMBAR 09/28/2008   PERNICIOUS ANEMIA 03/28/2008   Hypothyroidism 09/08/2007   HYPERLIPIDEMIA 09/08/2007   RESTLESS LEG SYNDROME 09/08/2007   HASHIMOTO'S THYROIDITIS 02/23/2007    Social History   Tobacco Use   Smoking status: Never   Smokeless tobacco: Never  Substance Use Topics   Alcohol use: No    Current Outpatient Medications:    acetaminophen (TYLENOL) 500 MG tablet, Take 500 mg by mouth every 6 (six) hours as needed for moderate pain., Disp: , Rfl:    diclofenac Sodium (VOLTAREN) 1 % GEL, Apply 2 g topically 4 (four) times daily as needed (Pain)., Disp: 50 g, Rfl: 0   esomeprazole (NEXIUM) 40 MG capsule, PLACE 1 CAPSULE IN FEEDING TUBE TWICE DAILY., Disp: 60 capsule, Rfl: 0   FLUoxetine (PROZAC)  20 MG capsule, TAKE 1 CAPSULE BY MOUTH DAILY., Disp: 30 capsule, Rfl: 0   fluticasone (FLONASE) 50 MCG/ACT nasal spray, Place 1 spray into both nostrils daily., Disp: , Rfl:    levothyroxine (SYNTHROID) 125 MCG tablet, Take 1 tablet (125 mcg total) by mouth daily before breakfast., Disp: 90 tablet, Rfl: 1   loratadine (CLARITIN) 10 MG  tablet, Take 10 mg by mouth daily., Disp: , Rfl:    meloxicam (MOBIC) 7.5 MG tablet, TAKE (1) TABLET BY MOUTH TWICE DAILY., Disp: 60 tablet, Rfl: 0   Nutritional Supplements (FEEDING SUPPLEMENT, JEVITY 1.5 CAL/FIBER,) LIQD, Place 50 mL/hr into feeding tube continuous. Dose to be titrated per RD at home., Disp: , Rfl:    Nutritional Supplements (FEEDING SUPPLEMENT, PROSOURCE TF,) liquid, Place 45 mLs into feeding tube daily., Disp: , Rfl:    ondansetron (ZOFRAN) 8 MG tablet, Take 1 tablet (8 mg total) by mouth 3 (three) times daily before meals. (Patient taking differently: Take 8 mg by mouth every 8 (eight) hours as needed for vomiting or nausea.), Disp: 90 tablet, Rfl: 0   ondansetron (ZOFRAN-ODT) 4 MG disintegrating tablet, TAKE (1) TABLET BY MOUTH EVERY EIGHT HOURS AS NEEDED., Disp: 10 tablet, Rfl: 0   Polyvinyl Alcohol-Povidone (ARTIFICIAL TEARS) 5-6 MG/ML SOLN, Place 2 drops into both eyes daily., Disp: , Rfl:    propranolol ER (INDERAL LA) 60 MG 24 hr capsule, Take 60 mg by mouth daily., Disp: , Rfl:    triamcinolone ointment (KENALOG) 0.1 %, Apply 1 application. topically 2 (two) times daily., Disp: 90 g, Rfl: 1   UNABLE TO FIND, TUBE FEEDS: 1/2 carton (118 ml) Jevity 1.5 QID with 60 ml free water before and 60 ml free water after each TF bolus and 45 ml Prosource TF (or equivalent) BID.    Maintain this regimen x4 days.    Then increase to 1 carton (237 ml) Jevity 1.5 QID with 60 ml free water before and 60 ml free water after each TF bolus and 45 ml Prosource TF (or equivalent) BID.    This goal regimen will provide 1500 kcal (86% kcal need), 82 grams protein (96% protein need), and 1200 ml free water., Disp: 1 Mutually Defined, Rfl: 0   Water For Irrigation, Sterile (FREE WATER) SOLN, Place 100 mLs into feeding tube 6 (six) times daily., Disp: , Rfl:    vitamin B-12 (CYANOCOBALAMIN) 250 MCG tablet, Take 250 mcg by mouth daily. (Patient not taking: Reported on 09/13/2022), Disp: , Rfl:    Allergies  Allergen Reactions   Gabapentin Nausea And Vomiting    hallucinations hallucinations   Other Nausea And Vomiting    Strong pain medications cause n/v Strong pain medications cause n/v   Morphine Nausea And Vomiting    Nausea & vomiting Nausea & vomiting    Objective:   Wt 128 lb (58.1 kg)   BMI 21.30 kg/m  Pt is able to speak clearly, coherently without shortness of breath or increased work of breathing. Thought process is linear.  Mood is appropriate.   Assessment and Plan:   Cellulitis- improving.  Area is less red than previous and pt is tolerating Keflex and Doxycycline as an outpt.  Pt is mostly immobile and also wears Depends- placing her at higher risk for recurrent infxn.  Referral made for home health wound care.  Pt expressed understanding and is in agreement w/ plan.   Nausea- improving.  Pt is able to tolerate tube feeds and will use Zofran if needed.  Weakness- ongoing.  Pt reports she still feels very weak.  Had PT on Monday and they are supposed to return again today.  Advised that she has been through a lot w/ her recent infection and this takes time to recover from.  Encouraged her to work w/ PT to build her strength.  Will follow.   Annye Asa, MD 09/13/2022  Time spent with the patient: 12 minutes, of which >50% was spent in obtaining information about symptoms, reviewing previous labs, evaluations, and treatments, counseling about condition (please see the discussed topics above), and developing a plan to further investigate it; had a number of questions which I addressed.

## 2022-09-16 DIAGNOSIS — Z791 Long term (current) use of non-steroidal anti-inflammatories (NSAID): Secondary | ICD-10-CM | POA: Diagnosis not present

## 2022-09-16 DIAGNOSIS — L89891 Pressure ulcer of other site, stage 1: Secondary | ICD-10-CM | POA: Diagnosis not present

## 2022-09-16 DIAGNOSIS — I5032 Chronic diastolic (congestive) heart failure: Secondary | ICD-10-CM | POA: Diagnosis not present

## 2022-09-16 DIAGNOSIS — Z931 Gastrostomy status: Secondary | ICD-10-CM | POA: Diagnosis not present

## 2022-09-16 DIAGNOSIS — E782 Mixed hyperlipidemia: Secondary | ICD-10-CM | POA: Diagnosis not present

## 2022-09-16 DIAGNOSIS — I110 Hypertensive heart disease with heart failure: Secondary | ICD-10-CM | POA: Diagnosis not present

## 2022-09-16 DIAGNOSIS — K219 Gastro-esophageal reflux disease without esophagitis: Secondary | ICD-10-CM | POA: Diagnosis not present

## 2022-09-16 DIAGNOSIS — M1990 Unspecified osteoarthritis, unspecified site: Secondary | ICD-10-CM | POA: Diagnosis not present

## 2022-09-16 DIAGNOSIS — E890 Postprocedural hypothyroidism: Secondary | ICD-10-CM | POA: Diagnosis not present

## 2022-09-16 DIAGNOSIS — K222 Esophageal obstruction: Secondary | ICD-10-CM | POA: Diagnosis not present

## 2022-09-16 DIAGNOSIS — L03317 Cellulitis of buttock: Secondary | ICD-10-CM | POA: Diagnosis not present

## 2022-09-17 ENCOUNTER — Telehealth: Payer: Self-pay | Admitting: Family Medicine

## 2022-09-17 NOTE — Telephone Encounter (Signed)
Caller name: Anderson Malta from [place]? :yes/no: No  Call back number: [phone]  Provider they see: Birdie Riddle   Reason for call:  Verbal order for a nursing to evaluate pt wounds on butt area. Anderson Malta stated that pt has 3 wounds on bottom area. Anderson Malta states that they need a nurse to look at pt bottom area.

## 2022-09-17 NOTE — Telephone Encounter (Signed)
Placed forms in Dr Tabori to be signed folder once signed I will fax back  

## 2022-09-17 NOTE — Telephone Encounter (Signed)
Ok for verbal order  °

## 2022-09-17 NOTE — Telephone Encounter (Signed)
Received Home Health form from [facility]; Placed in Dr. Virgil Benedict bin.

## 2022-09-17 NOTE — Telephone Encounter (Signed)
Received [place] forms from [facility]. Placed in Dr. Virgil Benedict bin.

## 2022-09-17 NOTE — Telephone Encounter (Signed)
Gave verbal order to [place] on confidential vm. Asked that she call back with any questions

## 2022-09-18 DIAGNOSIS — Z931 Gastrostomy status: Secondary | ICD-10-CM | POA: Diagnosis not present

## 2022-09-18 DIAGNOSIS — I110 Hypertensive heart disease with heart failure: Secondary | ICD-10-CM | POA: Diagnosis not present

## 2022-09-18 DIAGNOSIS — Z791 Long term (current) use of non-steroidal anti-inflammatories (NSAID): Secondary | ICD-10-CM | POA: Diagnosis not present

## 2022-09-18 DIAGNOSIS — E782 Mixed hyperlipidemia: Secondary | ICD-10-CM | POA: Diagnosis not present

## 2022-09-18 DIAGNOSIS — I5032 Chronic diastolic (congestive) heart failure: Secondary | ICD-10-CM | POA: Diagnosis not present

## 2022-09-18 DIAGNOSIS — L89891 Pressure ulcer of other site, stage 1: Secondary | ICD-10-CM | POA: Diagnosis not present

## 2022-09-18 DIAGNOSIS — E890 Postprocedural hypothyroidism: Secondary | ICD-10-CM | POA: Diagnosis not present

## 2022-09-18 DIAGNOSIS — M1990 Unspecified osteoarthritis, unspecified site: Secondary | ICD-10-CM | POA: Diagnosis not present

## 2022-09-18 DIAGNOSIS — K219 Gastro-esophageal reflux disease without esophagitis: Secondary | ICD-10-CM | POA: Diagnosis not present

## 2022-09-18 DIAGNOSIS — L03317 Cellulitis of buttock: Secondary | ICD-10-CM | POA: Diagnosis not present

## 2022-09-18 DIAGNOSIS — K222 Esophageal obstruction: Secondary | ICD-10-CM | POA: Diagnosis not present

## 2022-09-18 NOTE — Telephone Encounter (Signed)
Forms placed in Dr Birdie Riddle to be signed folder . Once signed I will fax them back

## 2022-09-19 DIAGNOSIS — L03317 Cellulitis of buttock: Secondary | ICD-10-CM | POA: Diagnosis not present

## 2022-09-19 DIAGNOSIS — K219 Gastro-esophageal reflux disease without esophagitis: Secondary | ICD-10-CM | POA: Diagnosis not present

## 2022-09-19 DIAGNOSIS — I110 Hypertensive heart disease with heart failure: Secondary | ICD-10-CM | POA: Diagnosis not present

## 2022-09-19 DIAGNOSIS — E782 Mixed hyperlipidemia: Secondary | ICD-10-CM | POA: Diagnosis not present

## 2022-09-19 DIAGNOSIS — E890 Postprocedural hypothyroidism: Secondary | ICD-10-CM | POA: Diagnosis not present

## 2022-09-19 DIAGNOSIS — Z931 Gastrostomy status: Secondary | ICD-10-CM | POA: Diagnosis not present

## 2022-09-19 DIAGNOSIS — Z791 Long term (current) use of non-steroidal anti-inflammatories (NSAID): Secondary | ICD-10-CM | POA: Diagnosis not present

## 2022-09-19 DIAGNOSIS — F419 Anxiety disorder, unspecified: Secondary | ICD-10-CM

## 2022-09-19 DIAGNOSIS — M1990 Unspecified osteoarthritis, unspecified site: Secondary | ICD-10-CM | POA: Diagnosis not present

## 2022-09-19 DIAGNOSIS — K222 Esophageal obstruction: Secondary | ICD-10-CM | POA: Diagnosis not present

## 2022-09-19 DIAGNOSIS — L89891 Pressure ulcer of other site, stage 1: Secondary | ICD-10-CM | POA: Diagnosis not present

## 2022-09-19 DIAGNOSIS — I5032 Chronic diastolic (congestive) heart failure: Secondary | ICD-10-CM | POA: Diagnosis not present

## 2022-09-19 NOTE — Telephone Encounter (Signed)
Form completed and returned to [name] 

## 2022-09-19 NOTE — Telephone Encounter (Signed)
Taken from [place] and faxed back, placed in the faxed folder by the fax machine

## 2022-09-19 NOTE — Telephone Encounter (Signed)
Taken from [place] and faxed back, placed in faxed folder by fax machine

## 2022-09-20 ENCOUNTER — Telehealth: Payer: Self-pay | Admitting: Nurse Practitioner

## 2022-09-20 ENCOUNTER — Telehealth: Payer: Self-pay | Admitting: Family Medicine

## 2022-09-20 NOTE — Telephone Encounter (Signed)
Given continued nausea should I have the patient make and appointment?

## 2022-09-20 NOTE — Telephone Encounter (Signed)
My first recommendation is to give a 2nd Ondansetron (Zofran) pill.  Then I would call GI and/or the palliative care team to see if there is something we can do for better management of symptoms.  GI would be the best place to start given her ongoing issues

## 2022-09-20 NOTE — Telephone Encounter (Signed)
Inbound call from patient husband requesting to speak with a nurse , states her wife is having a lot nauseas and want an advise , declined a OV .

## 2022-09-20 NOTE — Telephone Encounter (Signed)
Left message on machine to call back  

## 2022-09-20 NOTE — Telephone Encounter (Signed)
The pt was last seen on 3/13 fir failure to thrive.    "86 year old female with progressive dysphagia with failure to thrive which required hospital admission 01/07/2022. A modified barium swallow study 01/08/2022 which showed severe pharyngocervical dysphagia with both sensorimotor and obstructive dysphagia resulting in gross retention of barium throughout pharynx and aspiration of thin liquids. (Remote history of cervical disc s/p C4-5 fusion in 2008, cervical disc disease possibly contributing to her dysphagia). EGD 01/11/2022 showed mild mucosal wrent at the UES, a 2cm hiatal hernia without evidence of any gross esophageal lesion. Esophageal biopsies showed esophageal eosinophilia treated with PPI bid. No. H. Pylori. NS/P PEG tube placement by general surgery on tube feedings @ 50cc/hr from 4pm until 10 am. She started "spitting up" tube feedings when Jevity TF was increased from 40cc/hr to 50cc/hr. Ten pound weight loss over the past 4 weeks."   The pt's husband states that the pt has nausea daily.  Pcp prescribed zofran but the pt has not been taking it as prescribed.  She takes it only after she has become nauseous.  Dr Birdie Riddle told her to take when waking and then again as needed. I have reiterated those instructions and scheduled a follow up with Dr Bryan Lemma.  He was offered a PA appt but declined and only wants to see the MD.  They will call back if there are any worse symptoms or the zofran is not helpful when taken as directed.     Dr Bryan Lemma any further recommendations

## 2022-09-20 NOTE — Telephone Encounter (Signed)
Pt will take extra zofran and contact palliative and GI

## 2022-09-20 NOTE — Telephone Encounter (Signed)
Caller name: Eduard Clos (husband)  On DPR? :yes/no: No  Call back number: [phone]  Provider they see: Birdie Riddle  Reason for call: Calling because pt is still experiencing nausea symptoms; states that pt has not vomited this morning, but is very nauseated. States that he gave pt zofran this morning.

## 2022-09-20 NOTE — Telephone Encounter (Signed)
Patient reports took zofran at about 10-10:15 prior to taking medication notes yesterday she had thrown up her medication, today has kept meds down but still nauseous

## 2022-09-23 ENCOUNTER — Telehealth: Payer: Self-pay | Admitting: Family Medicine

## 2022-09-23 DIAGNOSIS — H524 Presbyopia: Secondary | ICD-10-CM | POA: Diagnosis not present

## 2022-09-23 DIAGNOSIS — H18423 Band keratopathy, bilateral: Secondary | ICD-10-CM | POA: Diagnosis not present

## 2022-09-23 DIAGNOSIS — H31003 Unspecified chorioretinal scars, bilateral: Secondary | ICD-10-CM | POA: Diagnosis not present

## 2022-09-23 DIAGNOSIS — H5213 Myopia, bilateral: Secondary | ICD-10-CM | POA: Diagnosis not present

## 2022-09-23 DIAGNOSIS — H04123 Dry eye syndrome of bilateral lacrimal glands: Secondary | ICD-10-CM | POA: Diagnosis not present

## 2022-09-23 DIAGNOSIS — Z961 Presence of intraocular lens: Secondary | ICD-10-CM | POA: Diagnosis not present

## 2022-09-23 LAB — HM DIABETES EYE EXAM

## 2022-09-23 NOTE — Telephone Encounter (Signed)
Received home health forms from [facility]. Placed in front bin with charge sheet. 

## 2022-09-23 NOTE — Telephone Encounter (Signed)
This was not in the folder w/ the other papers

## 2022-09-23 NOTE — Telephone Encounter (Signed)
Placed in Dr Birdie Riddle to be signed folder once signed I will fax back and notify pt

## 2022-09-24 ENCOUNTER — Encounter: Payer: Self-pay | Admitting: Family Medicine

## 2022-09-24 DIAGNOSIS — E890 Postprocedural hypothyroidism: Secondary | ICD-10-CM | POA: Diagnosis not present

## 2022-09-24 DIAGNOSIS — E782 Mixed hyperlipidemia: Secondary | ICD-10-CM | POA: Diagnosis not present

## 2022-09-24 DIAGNOSIS — L89891 Pressure ulcer of other site, stage 1: Secondary | ICD-10-CM | POA: Diagnosis not present

## 2022-09-24 DIAGNOSIS — Z791 Long term (current) use of non-steroidal anti-inflammatories (NSAID): Secondary | ICD-10-CM | POA: Diagnosis not present

## 2022-09-24 DIAGNOSIS — I110 Hypertensive heart disease with heart failure: Secondary | ICD-10-CM | POA: Diagnosis not present

## 2022-09-24 DIAGNOSIS — M1990 Unspecified osteoarthritis, unspecified site: Secondary | ICD-10-CM | POA: Diagnosis not present

## 2022-09-24 DIAGNOSIS — L03317 Cellulitis of buttock: Secondary | ICD-10-CM | POA: Diagnosis not present

## 2022-09-24 DIAGNOSIS — I5032 Chronic diastolic (congestive) heart failure: Secondary | ICD-10-CM | POA: Diagnosis not present

## 2022-09-24 DIAGNOSIS — K222 Esophageal obstruction: Secondary | ICD-10-CM | POA: Diagnosis not present

## 2022-09-24 DIAGNOSIS — Z931 Gastrostomy status: Secondary | ICD-10-CM | POA: Diagnosis not present

## 2022-09-24 DIAGNOSIS — K219 Gastro-esophageal reflux disease without esophagitis: Secondary | ICD-10-CM | POA: Diagnosis not present

## 2022-09-24 NOTE — Telephone Encounter (Signed)
New forms have been placed in Dr Birdie Riddle to be signed folder . Once signed I will fax back

## 2022-09-24 NOTE — Telephone Encounter (Signed)
I agree with the recommendations as stated, with adjustment of zofran timing as outlined. Can trial low fat diet, and ok to supplement caloric intake with Boost, ensure, etc.

## 2022-09-25 DIAGNOSIS — Z931 Gastrostomy status: Secondary | ICD-10-CM | POA: Diagnosis not present

## 2022-09-25 DIAGNOSIS — E890 Postprocedural hypothyroidism: Secondary | ICD-10-CM | POA: Diagnosis not present

## 2022-09-25 DIAGNOSIS — K219 Gastro-esophageal reflux disease without esophagitis: Secondary | ICD-10-CM | POA: Diagnosis not present

## 2022-09-25 DIAGNOSIS — K222 Esophageal obstruction: Secondary | ICD-10-CM | POA: Diagnosis not present

## 2022-09-25 DIAGNOSIS — Z791 Long term (current) use of non-steroidal anti-inflammatories (NSAID): Secondary | ICD-10-CM | POA: Diagnosis not present

## 2022-09-25 DIAGNOSIS — I5032 Chronic diastolic (congestive) heart failure: Secondary | ICD-10-CM | POA: Diagnosis not present

## 2022-09-25 DIAGNOSIS — L03317 Cellulitis of buttock: Secondary | ICD-10-CM | POA: Diagnosis not present

## 2022-09-25 DIAGNOSIS — L89891 Pressure ulcer of other site, stage 1: Secondary | ICD-10-CM | POA: Diagnosis not present

## 2022-09-25 DIAGNOSIS — M1990 Unspecified osteoarthritis, unspecified site: Secondary | ICD-10-CM | POA: Diagnosis not present

## 2022-09-25 DIAGNOSIS — E782 Mixed hyperlipidemia: Secondary | ICD-10-CM | POA: Diagnosis not present

## 2022-09-25 DIAGNOSIS — I110 Hypertensive heart disease with heart failure: Secondary | ICD-10-CM | POA: Diagnosis not present

## 2022-09-26 ENCOUNTER — Ambulatory Visit: Payer: Self-pay | Admitting: *Deleted

## 2022-09-26 NOTE — Patient Instructions (Signed)
Visit Information  Thank you for taking time to visit with me today. Please don't hesitate to contact me if I can be of assistance to you.   Following are the goals we discussed today:   Goals Addressed               This Visit's Progress     COMPLETED: Maintain healing of her cellulitis (pt-stated)        Update: Wound continues to heal with scabbed area with no redness or drainage from the wound site. [facility] continues to be involved PT however coming to an end soon. Continue GI issues with nausea but no vomiting as pt states she is continue to take her Zofran however limited due to weakness. Pt GI consult pending appointment in 11/05/2022 and pt on the waiting list if a sooner appointment becomes available. GI-tube feeds continues with no reported issues. Palliative continue to visit every 2-3 months. No other needs presented as pt has had some improvement with her ongoing care. Support spouse remains in the home alone with her children Joelene Millin Cox and Rhodia Albright). No further needs to address at this time case will be closed with all goals met.        Please call the care guide team at [phone] if you need to cancel or reschedule your appointment.   If you are experiencing a Mental Health or [place] or need someone to talk to, please call the Suicide and Crisis Lifeline: 988 call the [place] National Suicide Prevention Lifeline: 3322632223 or TTY: 564-709-9889 TTY (229) 381-3963) to talk to a trained counselor call 1-800-273-TALK (toll free, 24 hour hotline)  Patient verbalizes understanding of instructions and care plan provided today and agrees to view in [place]. Active MyChart status and patient understanding of how to access instructions and care plan via MyChart confirmed with patient.     No further follow up required: No further needs    Raina Mina, RN Care Management Coordinator [place] Office [phone]

## 2022-09-26 NOTE — Telephone Encounter (Signed)
Forms have been faxed and placed in scan  

## 2022-09-26 NOTE — Patient Outreach (Addendum)
  Care Coordination   Follow Up Visit Note   09/26/2022 Name: BEVERLYN MCGINNESS MRN: [id number] DOB: Apr 03, 1936  ANNIEBELLE DEVORE is a 86 y.o. year old female who sees Tabori, Aundra Millet, MD for primary care. I spoke with  Cherlynn Perches by phone today.  What matters to the patients health and wellness today?  No additional needs    Goals Addressed               This Visit's Progress     COMPLETED: Maintain healing of her cellulitis (pt-stated)        Update: Wound continues to heal with scabbed area with no redness or drainage from the wound site. [facility] continues to be involved PT however coming to an end soon. Continue GI issues with nausea but no vomiting as pt states she is continue to take her Zofran however limited due to weakness. Pt GI consult pending appointment in 11/05/2022 and pt on the waiting list if a sooner appointment becomes available. GI-tube feeds continues with no reported issues. Palliative continue to visit every 2-3 months. Continued to encouraged pt to work with all disciplines in managing her ongoing care.  No other needs presented as pt has had some improvement with her ongoing care. Support spouse remains in the home alone with her children Joelene Millin Cox and Rhodia Albright). No further needs to address at this time case will be closed with all goals met.        SDOH assessments and interventions completed:  Yes  SDOH Interventions Today    Flowsheet Row Most Recent Value  SDOH Interventions   Transportation Interventions Intervention Not Indicated        Care Coordination Interventions Activated:  Yes  Care Coordination Interventions:  Yes, provided   Follow up plan: No further intervention required.   Encounter Outcome:  Pt. Visit Completed   Raina Mina, RN Care Management Coordinator [place] Office [phone]

## 2022-09-26 NOTE — Telephone Encounter (Signed)
Forms completed and returned to [name] 

## 2022-09-30 ENCOUNTER — Telehealth: Payer: Self-pay

## 2022-09-30 NOTE — Telephone Encounter (Signed)
Called and informed husband.

## 2022-09-30 NOTE — Telephone Encounter (Signed)
Ok to continue both and send refills if needed

## 2022-09-30 NOTE — Telephone Encounter (Signed)
Patient husband called and asked if the patient was to continue the Juvin through her feeding tube as well as if it is okay to continue fanny cream from [facility] they are getting low on this.   Did verify with [facility] about prescription names as husband did not know the names of either

## 2022-10-02 DIAGNOSIS — E782 Mixed hyperlipidemia: Secondary | ICD-10-CM | POA: Diagnosis not present

## 2022-10-02 DIAGNOSIS — E890 Postprocedural hypothyroidism: Secondary | ICD-10-CM | POA: Diagnosis not present

## 2022-10-02 DIAGNOSIS — I110 Hypertensive heart disease with heart failure: Secondary | ICD-10-CM | POA: Diagnosis not present

## 2022-10-02 DIAGNOSIS — M1990 Unspecified osteoarthritis, unspecified site: Secondary | ICD-10-CM | POA: Diagnosis not present

## 2022-10-02 DIAGNOSIS — L89891 Pressure ulcer of other site, stage 1: Secondary | ICD-10-CM | POA: Diagnosis not present

## 2022-10-02 DIAGNOSIS — Z931 Gastrostomy status: Secondary | ICD-10-CM | POA: Diagnosis not present

## 2022-10-02 DIAGNOSIS — L03317 Cellulitis of buttock: Secondary | ICD-10-CM | POA: Diagnosis not present

## 2022-10-02 DIAGNOSIS — I5032 Chronic diastolic (congestive) heart failure: Secondary | ICD-10-CM | POA: Diagnosis not present

## 2022-10-02 DIAGNOSIS — K222 Esophageal obstruction: Secondary | ICD-10-CM | POA: Diagnosis not present

## 2022-10-02 DIAGNOSIS — Z791 Long term (current) use of non-steroidal anti-inflammatories (NSAID): Secondary | ICD-10-CM | POA: Diagnosis not present

## 2022-10-02 DIAGNOSIS — K219 Gastro-esophageal reflux disease without esophagitis: Secondary | ICD-10-CM | POA: Diagnosis not present

## 2022-10-03 DIAGNOSIS — Z931 Gastrostomy status: Secondary | ICD-10-CM | POA: Diagnosis not present

## 2022-10-03 DIAGNOSIS — I5032 Chronic diastolic (congestive) heart failure: Secondary | ICD-10-CM | POA: Diagnosis not present

## 2022-10-03 DIAGNOSIS — I110 Hypertensive heart disease with heart failure: Secondary | ICD-10-CM | POA: Diagnosis not present

## 2022-10-03 DIAGNOSIS — E890 Postprocedural hypothyroidism: Secondary | ICD-10-CM | POA: Diagnosis not present

## 2022-10-03 DIAGNOSIS — K222 Esophageal obstruction: Secondary | ICD-10-CM | POA: Diagnosis not present

## 2022-10-03 DIAGNOSIS — M1990 Unspecified osteoarthritis, unspecified site: Secondary | ICD-10-CM | POA: Diagnosis not present

## 2022-10-03 DIAGNOSIS — K219 Gastro-esophageal reflux disease without esophagitis: Secondary | ICD-10-CM | POA: Diagnosis not present

## 2022-10-03 DIAGNOSIS — L89891 Pressure ulcer of other site, stage 1: Secondary | ICD-10-CM | POA: Diagnosis not present

## 2022-10-03 DIAGNOSIS — Z791 Long term (current) use of non-steroidal anti-inflammatories (NSAID): Secondary | ICD-10-CM | POA: Diagnosis not present

## 2022-10-03 DIAGNOSIS — L03317 Cellulitis of buttock: Secondary | ICD-10-CM | POA: Diagnosis not present

## 2022-10-03 DIAGNOSIS — E782 Mixed hyperlipidemia: Secondary | ICD-10-CM | POA: Diagnosis not present

## 2022-10-05 DIAGNOSIS — E440 Moderate protein-calorie malnutrition: Secondary | ICD-10-CM | POA: Diagnosis not present

## 2022-10-05 DIAGNOSIS — R6339 Other feeding difficulties: Secondary | ICD-10-CM | POA: Diagnosis not present

## 2022-10-05 DIAGNOSIS — R627 Adult failure to thrive: Secondary | ICD-10-CM | POA: Diagnosis not present

## 2022-10-07 ENCOUNTER — Telehealth: Payer: Self-pay | Admitting: Family Medicine

## 2022-10-07 ENCOUNTER — Other Ambulatory Visit: Payer: Self-pay | Admitting: Family Medicine

## 2022-10-07 DIAGNOSIS — K222 Esophageal obstruction: Secondary | ICD-10-CM | POA: Diagnosis not present

## 2022-10-07 DIAGNOSIS — I5032 Chronic diastolic (congestive) heart failure: Secondary | ICD-10-CM | POA: Diagnosis not present

## 2022-10-07 DIAGNOSIS — L89891 Pressure ulcer of other site, stage 1: Secondary | ICD-10-CM | POA: Diagnosis not present

## 2022-10-07 DIAGNOSIS — M1990 Unspecified osteoarthritis, unspecified site: Secondary | ICD-10-CM | POA: Diagnosis not present

## 2022-10-07 DIAGNOSIS — E782 Mixed hyperlipidemia: Secondary | ICD-10-CM | POA: Diagnosis not present

## 2022-10-07 DIAGNOSIS — Z791 Long term (current) use of non-steroidal anti-inflammatories (NSAID): Secondary | ICD-10-CM | POA: Diagnosis not present

## 2022-10-07 DIAGNOSIS — Z931 Gastrostomy status: Secondary | ICD-10-CM | POA: Diagnosis not present

## 2022-10-07 DIAGNOSIS — I110 Hypertensive heart disease with heart failure: Secondary | ICD-10-CM | POA: Diagnosis not present

## 2022-10-07 DIAGNOSIS — L03317 Cellulitis of buttock: Secondary | ICD-10-CM | POA: Diagnosis not present

## 2022-10-07 DIAGNOSIS — R519 Headache, unspecified: Secondary | ICD-10-CM

## 2022-10-07 DIAGNOSIS — E890 Postprocedural hypothyroidism: Secondary | ICD-10-CM | POA: Diagnosis not present

## 2022-10-07 DIAGNOSIS — K219 Gastro-esophageal reflux disease without esophagitis: Secondary | ICD-10-CM | POA: Diagnosis not present

## 2022-10-07 NOTE — Telephone Encounter (Signed)
Placed forms in Dr Birdie Riddle to be signed folder and once signed I will fax back

## 2022-10-07 NOTE — Telephone Encounter (Signed)
Received form from [facility]. Placed in front bin with charge sheet.

## 2022-10-08 NOTE — Telephone Encounter (Signed)
Forms faxed and placed in scan  

## 2022-10-08 NOTE — Telephone Encounter (Signed)
Form signed and returned to [name] 

## 2022-10-09 DIAGNOSIS — M1990 Unspecified osteoarthritis, unspecified site: Secondary | ICD-10-CM | POA: Diagnosis not present

## 2022-10-09 DIAGNOSIS — I5032 Chronic diastolic (congestive) heart failure: Secondary | ICD-10-CM | POA: Diagnosis not present

## 2022-10-09 DIAGNOSIS — E890 Postprocedural hypothyroidism: Secondary | ICD-10-CM | POA: Diagnosis not present

## 2022-10-09 DIAGNOSIS — Z791 Long term (current) use of non-steroidal anti-inflammatories (NSAID): Secondary | ICD-10-CM | POA: Diagnosis not present

## 2022-10-09 DIAGNOSIS — K222 Esophageal obstruction: Secondary | ICD-10-CM | POA: Diagnosis not present

## 2022-10-09 DIAGNOSIS — L03317 Cellulitis of buttock: Secondary | ICD-10-CM | POA: Diagnosis not present

## 2022-10-09 DIAGNOSIS — I110 Hypertensive heart disease with heart failure: Secondary | ICD-10-CM | POA: Diagnosis not present

## 2022-10-09 DIAGNOSIS — K219 Gastro-esophageal reflux disease without esophagitis: Secondary | ICD-10-CM | POA: Diagnosis not present

## 2022-10-09 DIAGNOSIS — E782 Mixed hyperlipidemia: Secondary | ICD-10-CM | POA: Diagnosis not present

## 2022-10-09 DIAGNOSIS — L89891 Pressure ulcer of other site, stage 1: Secondary | ICD-10-CM | POA: Diagnosis not present

## 2022-10-09 DIAGNOSIS — Z931 Gastrostomy status: Secondary | ICD-10-CM | POA: Diagnosis not present

## 2022-10-10 ENCOUNTER — Telehealth: Payer: Self-pay | Admitting: Lab

## 2022-10-10 ENCOUNTER — Telehealth: Payer: Self-pay | Admitting: Family Medicine

## 2022-10-10 NOTE — Telephone Encounter (Signed)
Received paperwork from the front from [facility] and put it in dr. Rande Lawman folder to be reviewed

## 2022-10-10 NOTE — Telephone Encounter (Signed)
Received home health forms from [facility]. Placed in front bin.

## 2022-10-15 DIAGNOSIS — I5032 Chronic diastolic (congestive) heart failure: Secondary | ICD-10-CM | POA: Diagnosis not present

## 2022-10-15 DIAGNOSIS — L03317 Cellulitis of buttock: Secondary | ICD-10-CM | POA: Diagnosis not present

## 2022-10-15 DIAGNOSIS — E890 Postprocedural hypothyroidism: Secondary | ICD-10-CM | POA: Diagnosis not present

## 2022-10-15 DIAGNOSIS — Z931 Gastrostomy status: Secondary | ICD-10-CM | POA: Diagnosis not present

## 2022-10-15 DIAGNOSIS — L89891 Pressure ulcer of other site, stage 1: Secondary | ICD-10-CM | POA: Diagnosis not present

## 2022-10-15 DIAGNOSIS — K222 Esophageal obstruction: Secondary | ICD-10-CM | POA: Diagnosis not present

## 2022-10-15 DIAGNOSIS — I110 Hypertensive heart disease with heart failure: Secondary | ICD-10-CM | POA: Diagnosis not present

## 2022-10-15 DIAGNOSIS — Z791 Long term (current) use of non-steroidal anti-inflammatories (NSAID): Secondary | ICD-10-CM | POA: Diagnosis not present

## 2022-10-15 DIAGNOSIS — E782 Mixed hyperlipidemia: Secondary | ICD-10-CM | POA: Diagnosis not present

## 2022-10-15 DIAGNOSIS — M1990 Unspecified osteoarthritis, unspecified site: Secondary | ICD-10-CM | POA: Diagnosis not present

## 2022-10-15 DIAGNOSIS — K219 Gastro-esophageal reflux disease without esophagitis: Secondary | ICD-10-CM | POA: Diagnosis not present

## 2022-10-18 DIAGNOSIS — Z931 Gastrostomy status: Secondary | ICD-10-CM | POA: Diagnosis not present

## 2022-10-18 DIAGNOSIS — L89891 Pressure ulcer of other site, stage 1: Secondary | ICD-10-CM | POA: Diagnosis not present

## 2022-10-18 DIAGNOSIS — E890 Postprocedural hypothyroidism: Secondary | ICD-10-CM | POA: Diagnosis not present

## 2022-10-18 DIAGNOSIS — L03317 Cellulitis of buttock: Secondary | ICD-10-CM | POA: Diagnosis not present

## 2022-10-18 DIAGNOSIS — I110 Hypertensive heart disease with heart failure: Secondary | ICD-10-CM | POA: Diagnosis not present

## 2022-10-18 DIAGNOSIS — Z791 Long term (current) use of non-steroidal anti-inflammatories (NSAID): Secondary | ICD-10-CM | POA: Diagnosis not present

## 2022-10-18 DIAGNOSIS — I5032 Chronic diastolic (congestive) heart failure: Secondary | ICD-10-CM | POA: Diagnosis not present

## 2022-10-18 DIAGNOSIS — K219 Gastro-esophageal reflux disease without esophagitis: Secondary | ICD-10-CM | POA: Diagnosis not present

## 2022-10-18 DIAGNOSIS — E782 Mixed hyperlipidemia: Secondary | ICD-10-CM | POA: Diagnosis not present

## 2022-10-18 DIAGNOSIS — M1990 Unspecified osteoarthritis, unspecified site: Secondary | ICD-10-CM | POA: Diagnosis not present

## 2022-10-18 DIAGNOSIS — K222 Esophageal obstruction: Secondary | ICD-10-CM | POA: Diagnosis not present

## 2022-10-22 DIAGNOSIS — E782 Mixed hyperlipidemia: Secondary | ICD-10-CM | POA: Diagnosis not present

## 2022-10-22 DIAGNOSIS — L03317 Cellulitis of buttock: Secondary | ICD-10-CM | POA: Diagnosis not present

## 2022-10-22 DIAGNOSIS — K222 Esophageal obstruction: Secondary | ICD-10-CM | POA: Diagnosis not present

## 2022-10-22 DIAGNOSIS — I5032 Chronic diastolic (congestive) heart failure: Secondary | ICD-10-CM | POA: Diagnosis not present

## 2022-10-22 DIAGNOSIS — L89891 Pressure ulcer of other site, stage 1: Secondary | ICD-10-CM | POA: Diagnosis not present

## 2022-10-22 DIAGNOSIS — M1990 Unspecified osteoarthritis, unspecified site: Secondary | ICD-10-CM | POA: Diagnosis not present

## 2022-10-22 DIAGNOSIS — Z931 Gastrostomy status: Secondary | ICD-10-CM | POA: Diagnosis not present

## 2022-10-22 DIAGNOSIS — I110 Hypertensive heart disease with heart failure: Secondary | ICD-10-CM | POA: Diagnosis not present

## 2022-10-22 DIAGNOSIS — E890 Postprocedural hypothyroidism: Secondary | ICD-10-CM | POA: Diagnosis not present

## 2022-10-22 DIAGNOSIS — Z791 Long term (current) use of non-steroidal anti-inflammatories (NSAID): Secondary | ICD-10-CM | POA: Diagnosis not present

## 2022-10-22 DIAGNOSIS — K219 Gastro-esophageal reflux disease without esophagitis: Secondary | ICD-10-CM | POA: Diagnosis not present

## 2022-10-23 DIAGNOSIS — H6123 Impacted cerumen, bilateral: Secondary | ICD-10-CM | POA: Diagnosis not present

## 2022-10-29 DIAGNOSIS — E890 Postprocedural hypothyroidism: Secondary | ICD-10-CM | POA: Diagnosis not present

## 2022-10-29 DIAGNOSIS — K222 Esophageal obstruction: Secondary | ICD-10-CM | POA: Diagnosis not present

## 2022-10-29 DIAGNOSIS — K219 Gastro-esophageal reflux disease without esophagitis: Secondary | ICD-10-CM | POA: Diagnosis not present

## 2022-10-29 DIAGNOSIS — L03317 Cellulitis of buttock: Secondary | ICD-10-CM | POA: Diagnosis not present

## 2022-10-29 DIAGNOSIS — Z931 Gastrostomy status: Secondary | ICD-10-CM | POA: Diagnosis not present

## 2022-10-29 DIAGNOSIS — E782 Mixed hyperlipidemia: Secondary | ICD-10-CM | POA: Diagnosis not present

## 2022-10-29 DIAGNOSIS — M1990 Unspecified osteoarthritis, unspecified site: Secondary | ICD-10-CM | POA: Diagnosis not present

## 2022-10-29 DIAGNOSIS — I110 Hypertensive heart disease with heart failure: Secondary | ICD-10-CM | POA: Diagnosis not present

## 2022-10-29 DIAGNOSIS — L89891 Pressure ulcer of other site, stage 1: Secondary | ICD-10-CM | POA: Diagnosis not present

## 2022-10-29 DIAGNOSIS — I5032 Chronic diastolic (congestive) heart failure: Secondary | ICD-10-CM | POA: Diagnosis not present

## 2022-10-29 DIAGNOSIS — Z791 Long term (current) use of non-steroidal anti-inflammatories (NSAID): Secondary | ICD-10-CM | POA: Diagnosis not present

## 2022-10-30 ENCOUNTER — Telehealth: Payer: Medicare Other | Admitting: Nurse Practitioner

## 2022-11-04 DIAGNOSIS — R6339 Other feeding difficulties: Secondary | ICD-10-CM | POA: Diagnosis not present

## 2022-11-04 DIAGNOSIS — R627 Adult failure to thrive: Secondary | ICD-10-CM | POA: Diagnosis not present

## 2022-11-04 DIAGNOSIS — E440 Moderate protein-calorie malnutrition: Secondary | ICD-10-CM | POA: Diagnosis not present

## 2022-11-05 ENCOUNTER — Ambulatory Visit: Payer: Medicare Other | Admitting: Gastroenterology

## 2022-11-05 ENCOUNTER — Encounter: Payer: Self-pay | Admitting: Gastroenterology

## 2022-11-05 VITALS — BP 110/58 | HR 87 | Ht 66.0 in | Wt 132.4 lb

## 2022-11-05 DIAGNOSIS — K219 Gastro-esophageal reflux disease without esophagitis: Secondary | ICD-10-CM | POA: Diagnosis not present

## 2022-11-05 DIAGNOSIS — R112 Nausea with vomiting, unspecified: Secondary | ICD-10-CM | POA: Diagnosis not present

## 2022-11-05 DIAGNOSIS — K9423 Gastrostomy malfunction: Secondary | ICD-10-CM

## 2022-11-05 DIAGNOSIS — K449 Diaphragmatic hernia without obstruction or gangrene: Secondary | ICD-10-CM | POA: Diagnosis not present

## 2022-11-05 MED ORDER — METOCLOPRAMIDE HCL 5 MG PO TABS: 5.0000 mg | ORAL_TABLET | Freq: Two times a day (BID) | ORAL | 3 refills | Status: DC

## 2022-11-05 NOTE — Patient Instructions (Addendum)
We have sent the following medications to your pharmacy for you to pick up at your convenience: Reglan 5 mg  Please follow up in 3 months. Give [place] a call at [phone] to schedule an appointment.   If you are age 84 or older, your body mass index should be between 23-30. Your Body mass index is 21.37 kg/m. If this is out of the aforementioned range listed, please consider follow up with your Primary Care Provider.  If you are age 4 or younger, your body mass index should be between 19-25. Your Body mass index is 21.37 kg/m. If this is out of the aformentioned range listed, please consider follow up with your Primary Care Provider.   __________________________________________________________  The [facility] providers would like to encourage you to use [facility] to communicate with providers for non-urgent requests or questions.  Due to long hold times on the telephone, sending your provider a message by [facility] may be a faster and more efficient way to get a response.  Please allow 48 business hours for a response.  Please remember that this is for non-urgent requests.   Due to recent changes in healthcare laws, you may see the results of your imaging and laboratory studies on MyChart before your provider has had a chance to review them.  We understand that in some cases there may be results that are confusing or concerning to you. Not all laboratory results come back in the same time frame and the provider may be waiting for multiple results in order to interpret others.  Please give [place] 48 hours in order for your provider to thoroughly review all the results before contacting the office for clarification of your results.     Thank you for choosing me and [facility].  Vito Cirigliano, D.O.

## 2022-11-05 NOTE — Progress Notes (Unsigned)
Chief Complaint:    Nausea, vomiting  GI History: 86 year old female with a history of  anxiety, arthritis, hypertension, hyperlipidemia, CHF, hypothyroidism s/p partial thyroidectomy, vertigo, Covid 19 infection 11/2021, anemia, herniated cervical disc with spondylosis degenerative disk disease with radiculopathy s/p cervical decompression and fusion C4-5 2008, esophageal stricture 2011 and GERD.  - 12/18/2021: Evaluated in the GI clinic for dysphagia with weight loss starting after hospitalization with left hip fracture s/p reduction in rehab in 11/2019.  Reported solid food dysphagia, decreased appetite, early satiety with 50 pound weight loss over 1 year. - 12/27/2021: UGI series: aspiration of contrast into the trachea at the level of the mainstem bronchi therefore the study was terminated and the barium tablet was not administered. A duodenal diverticula was noted - 01/07/2022: Hospital admission for persistent dysphagia, generalized weakness and failure to thrive.  Hospital course complicated by CHF exacerbation which improved with IV diuresis - 01/08/2022: MBS: severe pharyngocervical dysphagia with both sensorimotor and obstructive dysphagia resulting in gross retention of barium throughout pharynx and aspiration of thin liquids. (Remote history of cervical disc s/p C4-5 fusion in 2008, cervical disc disease possibly contributing to her dysphagia). A thick nectar liquid diet, follow solids with liquids and to remain seated 90 degrees after meals.  - 01/11/2022: EGD: mild mucosal wrent at the UES, a 2cm hiatal hernia without evidence of any gross esophageal lesion. Esophageal biopsies showed esophageal eosinophilia treated with PPI bid. No. H. Pylori. - 01/15/2022: IR attempted PEG tube placement but not done due to superimposed: - 01/17/2022: PEG tube placement by General Surgery and started on tube feedings - 02/18/2022: Follow-up with: Suann Larry in the GI clinic.  Concern for regurgitation/reflux of  tube feeds.  Ordered CXR, abdominal x-ray, UGI series, recommended HOB elevation to reduce risk of aspiration, increased esomeprazole to 40 mg twice daily via PEG - 02/18/2022: CXR: Likely small bilateral pleural effusions without focal infiltrate.  Abdominal x-ray with feeding tube in place and otherwise unremarkable -08/25/2022: ER evaluation for nausea/vomiting.  CT A/P n/f gastrostomy in place but otherwise no bowel wall thickening, obstruction.  Diverticulosis without diverticulitis.  CXR unremarkable.  Normal CBC, liver enzymes.  Sodium 133 (at baseline) with otherwise normal renal function.  Diagnosed with cellulitis and discharged with Keflex. - 09/02/2028, 2023.  Hospital admission for cellulitis, generalized weakness, nausea/vomiting.     Endoscopic History: EGD 01/11/2022: - No gross lesions in esophagus. Biopsied. Dilated. Mild wrent at the UES noted. - Z-line regular, 39 cm from the incisors. - 2 cm hiatal hernia. Erythematous mucosa in the stomach - biopsied. - No gross lesions in the duodenal bulb, in the first portion of the duodenum and in the second portion of the duodenum. Biopsied.   A. STOMACH, BIOPSY:  - Antral and oxyntic mucosa with hyperemia.  - No Helicobacter pylori identified.   B. DUODENUM, BIOPSY:  - Duodenal mucosa with intact villous architecture.  - No villous atrophy or increased intraepithelial lymphocytes.   C. ESOPHAGUS, RANDOM, BIOPSY:  - Squamous mucosa with focal, mildly increased eosinophils.  - Focally up to 17 per high-power field.    EGD 03/01/2010: showed gastritis, a stone found at the GE junction, early esophageal stricture with few esophageal erosions. Esophagus dilated with 18 mm dilator with mild resistance otherwise was a normal exam. Path report showed gastric body mucosa with minimal chronic inflammation, no evidence of Helicobacter pylori, intestinal metaplasia, dysplasia or malignancy identified.    Colonoscopy by Dr. Deatra Ina 5/10/2 2011:  showed moderate diverticulosis  to the sigmoid colon, no polyps.  HPI:     Patient is a 86 y.o. female presenting to the Gastroenterology Clinic for evaluation of nausea/vomiting.  Symptoms were progressively worse in September and was evaluated in the ER on 9/17, then admitted to the hospital x4 days at the end of September as outlined above.    Had follow-up with her PCM on 09/13/2022.  Was tolerating tube feeds a little better and nausea was improving.  Currently doing TF 18 hours/day (4PM to 10AM). Nausea better in the afternoon.  No longer vomiting.  Not currently following with Nutrition. Follows with Palliative Care every 2-3 months; last seen 08/14/2022. Takes Zofran ODT prn- tries to limit the use.  She is still taking Nexium 40 mg BID.   Presents to the office with her daughter who is an OR Marine scientist and her husband.  Review of systems:     No chest pain, no SOB, no fevers, no urinary sx   Past Medical History:  Diagnosis Date   Allergy    seasonal & dust   Anxiety    Arthritis    DJD   Cellulitis    Family history of adverse reaction to anesthesia    n/v    GERD (gastroesophageal reflux disease)    History of vertigo    Hyperlipidemia    Hypertension    Hypothyroidism    PONV (postoperative nausea and vomiting)    Thyroid disease    hypothyroidism post Hashimoto's & partial thyroidectomy    Patient's surgical history, family medical history, social history, medications and allergies were all reviewed in Epic    Current Outpatient Medications  Medication Sig Dispense Refill   acetaminophen (TYLENOL) 500 MG tablet Take 500 mg by mouth every 6 (six) hours as needed for moderate pain.     diclofenac Sodium (VOLTAREN) 1 % GEL Apply 2 g topically 4 (four) times daily as needed (Pain). 50 g 0   esomeprazole (NEXIUM) 40 MG capsule PLACE 1 CAPSULE IN FEEDING TUBE TWICE DAILY. 60 capsule 0   FLUoxetine (PROZAC) 20 MG capsule TAKE 1 CAPSULE BY MOUTH DAILY. 30 capsule 0    fluticasone (FLONASE) 50 MCG/ACT nasal spray Place 1 spray into both nostrils daily.     levothyroxine (SYNTHROID) 125 MCG tablet Take 1 tablet (125 mcg total) by mouth daily before breakfast. 90 tablet 1   loratadine (CLARITIN) 10 MG tablet Take 10 mg by mouth daily.     meloxicam (MOBIC) 7.5 MG tablet TAKE (1) TABLET BY MOUTH TWICE DAILY. 60 tablet 0   Nutritional Supplements (FEEDING SUPPLEMENT, JEVITY 1.5 CAL/FIBER,) LIQD Place 50 mL/hr into feeding tube continuous. Dose to be titrated per RD at home.     Nutritional Supplements (FEEDING SUPPLEMENT, PROSOURCE TF,) liquid Place 45 mLs into feeding tube daily.     ondansetron (ZOFRAN) 8 MG tablet Take 1 tablet (8 mg total) by mouth 3 (three) times daily before meals. (Patient taking differently: Take 8 mg by mouth every 8 (eight) hours as needed for vomiting or nausea.) 90 tablet 0   ondansetron (ZOFRAN-ODT) 4 MG disintegrating tablet TAKE (1) TABLET BY MOUTH EVERY EIGHT HOURS AS NEEDED. 10 tablet 0   Polyvinyl Alcohol-Povidone (ARTIFICIAL TEARS) 5-6 MG/ML SOLN Place 2 drops into both eyes daily.     Water For Irrigation, Sterile (FREE WATER) SOLN Place 100 mLs into feeding tube 6 (six) times daily.     propranolol ER (INDERAL LA) 60 MG 24 hr capsule Take 60 mg by  mouth daily. (Patient not taking: Reported on 11/05/2022)     triamcinolone ointment (KENALOG) 0.1 % Apply 1 application. topically 2 (two) times daily. (Patient not taking: Reported on 11/05/2022) 90 g 1   UNABLE TO FIND TUBE FEEDS: 1/2 carton (118 ml) Jevity 1.5 QID with 60 ml free water before and 60 ml free water after each TF bolus and 45 ml Prosource TF (or equivalent) BID.    Maintain this regimen x4 days.    Then increase to 1 carton (237 ml) Jevity 1.5 QID with 60 ml free water before and 60 ml free water after each TF bolus and 45 ml Prosource TF (or equivalent) BID.    This goal regimen will provide 1500 kcal (86% kcal need), 82 grams protein (96% protein need), and 1200 ml  free water. (Patient not taking: Reported on 11/05/2022) 1 Mutually Defined 0   vitamin B-12 (CYANOCOBALAMIN) 250 MCG tablet Take 250 mcg by mouth daily. (Patient not taking: Reported on 11/05/2022)     No current facility-administered medications for this visit.    Physical Exam:     BP (!) 110/58 (BP Location: Left Arm, Patient Position: Sitting, Cuff Size: Normal)   Pulse 87   Ht '5\' 6"'$  (1.676 m)   Wt 132 lb 6.4 oz (60.1 kg)   SpO2 100%   BMI 21.37 kg/m   GENERAL:  Pleasant female in NAD PSYCH: : Cooperative, normal affect ABDOMEN: PEG in place with minimal leakage around PEG site.  No e/o infection.  External bumper has a crack in it.  PEG tube otherwise mobile.  Abdomen nondistended, soft, nontender.  NEURO: Alert and oriented x 3, no focal neurologic deficits   IMPRESSION and PLAN:    1) Nausea 2) Hiatal hernia 3) GERD Suspect her continued nausea could be related to the continuous tube feeds, particularly at nighttime, possibly with tube feed regurgitation due to her hiatal hernia/anatomic disruption.  We discussed options to include 1) trial of Reglan to promote gastric motility and forward flow, 2) modified tube feeds to a breakfast/lunch/dinner schedule, and she prefers the trial of Reglan to start.  Do not think she is an operative candidate for hiatal hernia repair. - Trial Reglan 5 mg to take scheduled prior to starting tube feeds and halfway through tube feeding session.  Liquid formulation not covered by her insurance, so they will crush medication - If no significant improvement, will refer to Nutrition Clinic to assist in modifying her tube feeds to a daytime schedule - Resume high-dose PPI as currently prescribed - Continue follow-up with Palliative Care  4) PEG tube in place External bumper is cracked.  Reviewed op report and to sewn in place with pursestring. - To call the Surgical Clinic for possible tube exchange.  Can possibly exchange for a low-profile  MIC-KEY PEG tube?   RTC in 3 months or sooner prn           Lavena Bullion ,DO, FACG 11/05/2022, 2:07 PM

## 2022-11-06 ENCOUNTER — Ambulatory Visit: Payer: Medicare Other | Admitting: Podiatry

## 2022-11-06 ENCOUNTER — Other Ambulatory Visit: Payer: Self-pay | Admitting: Family Medicine

## 2022-11-06 DIAGNOSIS — B351 Tinea unguium: Secondary | ICD-10-CM

## 2022-11-06 DIAGNOSIS — M79674 Pain in right toe(s): Secondary | ICD-10-CM

## 2022-11-06 DIAGNOSIS — M79675 Pain in left toe(s): Secondary | ICD-10-CM

## 2022-11-06 DIAGNOSIS — R519 Headache, unspecified: Secondary | ICD-10-CM

## 2022-11-06 NOTE — Progress Notes (Signed)
   SUBJECTIVE Patient presents to office today complaining of elongated, thickened nails that cause pain while ambulating in shoes.  Patient is unable to trim their own nails. Patient is here for further evaluation and treatment.  Past Medical History:  Diagnosis Date   Allergy    seasonal & dust   Anxiety    Arthritis    DJD   Cellulitis    Family history of adverse reaction to anesthesia    n/v    GERD (gastroesophageal reflux disease)    History of vertigo    Hyperlipidemia    Hypertension    Hypothyroidism    PONV (postoperative nausea and vomiting)    Thyroid disease    hypothyroidism post Hashimoto's & partial thyroidectomy    OBJECTIVE General Patient is awake, alert, and oriented x 3 and in no acute distress. Derm Skin is dry and supple bilateral. Negative open lesions or macerations. Remaining integument unremarkable. Nails are tender, long, thickened and dystrophic with subungual debris, consistent with onychomycosis, 1-5 bilateral. No signs of infection noted. Vasc  DP and PT pedal pulses palpable bilaterally. Temperature gradient within normal limits.  Neuro Epicritic and protective threshold sensation grossly intact bilaterally.  Musculoskeletal Exam No symptomatic pedal deformities noted bilateral. Muscular strength within normal limits.  ASSESSMENT 1.  Pain due to onychomycosis of toenails both  PLAN OF CARE 1. Patient evaluated today.  2. Instructed to maintain good pedal hygiene and foot care.  3. Mechanical debridement of nails 1-5 bilaterally performed using a nail nipper. Filed with dremel without incident.  4. Return to clinic in 3 mos.    [place] M. Jeannine Pennisi, DPM [facility]  Dr. Rakesha Dalporto M. Aleeya Veitch, DPM    [address]                                     [place], [place] [address]                Office [phone]  Fax [phone]   

## 2022-11-06 NOTE — Telephone Encounter (Signed)
Patient is requesting a refill of the following medications: Requested Prescriptions   Pending Prescriptions Disp Refills   meloxicam (MOBIC) 7.5 MG tablet [Pharmacy Med Name: MELOXICAM 7.'5MG'$  TABLET] 60 tablet 0    Sig: TAKE (1) TABLET BY MOUTH TWICE DAILY.   FLUoxetine (PROZAC) 20 MG capsule [Pharmacy Med Name: FLUOXETINE HCL 20 MG CAPSULE] 30 capsule 0    Sig: TAKE 1 CAPSULE BY MOUTH DAILY.   esomeprazole (NEXIUM) 40 MG capsule [Pharmacy Med Name: ESOMEPRAZOLE MAG DR 40 MG CAP] 60 capsule 0    Sig: PLACE 1 CAPSULE IN FEEDING TUBE TWICE DAILY.    Date of patient request: 11/06/22 Last office visit: 09/13/22 Date of last refill: 10/07/22 Last refill amount: 60

## 2022-11-13 ENCOUNTER — Telehealth: Payer: Self-pay

## 2022-11-13 NOTE — Telephone Encounter (Signed)
Received forms from [facility] to be signed by Dr Birdie Riddle placed in Dr Birdie Riddle to be signed folder

## 2022-11-15 NOTE — Telephone Encounter (Signed)
Form faxed and placed in scan  

## 2022-11-15 NOTE — Telephone Encounter (Signed)
Signed and returned to [name] 

## 2022-11-24 DIAGNOSIS — R627 Adult failure to thrive: Secondary | ICD-10-CM | POA: Diagnosis not present

## 2022-11-24 DIAGNOSIS — E440 Moderate protein-calorie malnutrition: Secondary | ICD-10-CM | POA: Diagnosis not present

## 2022-11-24 DIAGNOSIS — R6339 Other feeding difficulties: Secondary | ICD-10-CM | POA: Diagnosis not present

## 2022-12-09 DIAGNOSIS — R6339 Other feeding difficulties: Secondary | ICD-10-CM | POA: Diagnosis not present

## 2022-12-09 DIAGNOSIS — R627 Adult failure to thrive: Secondary | ICD-10-CM | POA: Diagnosis not present

## 2022-12-09 DIAGNOSIS — E440 Moderate protein-calorie malnutrition: Secondary | ICD-10-CM | POA: Diagnosis not present

## 2022-12-24 DIAGNOSIS — E440 Moderate protein-calorie malnutrition: Secondary | ICD-10-CM | POA: Diagnosis not present

## 2022-12-24 DIAGNOSIS — R627 Adult failure to thrive: Secondary | ICD-10-CM | POA: Diagnosis not present

## 2022-12-24 DIAGNOSIS — R6339 Other feeding difficulties: Secondary | ICD-10-CM | POA: Diagnosis not present

## 2022-12-27 ENCOUNTER — Telehealth: Payer: Self-pay | Admitting: Family Medicine

## 2022-12-27 NOTE — Telephone Encounter (Signed)
Forms completed and returned to [name] 

## 2022-12-27 NOTE — Telephone Encounter (Signed)
Placed forms in Dr Birdie Riddle to be signed folder

## 2022-12-27 NOTE — Telephone Encounter (Signed)
Faxed  forms and placed in scan  

## 2022-12-27 NOTE — Telephone Encounter (Signed)
Received home health forms from [facility]. Placed in front bin with charge sheet. 

## 2023-01-03 ENCOUNTER — Encounter: Payer: Self-pay | Admitting: Gastroenterology

## 2023-01-04 DIAGNOSIS — E440 Moderate protein-calorie malnutrition: Secondary | ICD-10-CM | POA: Diagnosis not present

## 2023-01-04 DIAGNOSIS — R6339 Other feeding difficulties: Secondary | ICD-10-CM | POA: Diagnosis not present

## 2023-01-04 DIAGNOSIS — R627 Adult failure to thrive: Secondary | ICD-10-CM | POA: Diagnosis not present

## 2023-01-22 ENCOUNTER — Telehealth: Payer: Self-pay

## 2023-01-22 ENCOUNTER — Other Ambulatory Visit: Payer: Self-pay

## 2023-01-22 NOTE — Telephone Encounter (Signed)
Received request for fanny cream, could not find in the chart but feel like I recall this patient having this before? Please advise? Should she be seen?

## 2023-01-22 NOTE — Telephone Encounter (Signed)
Spoke to [place] the Software engineer at [employer] and refilled the [employer]

## 2023-01-22 NOTE — Telephone Encounter (Signed)
[place] for refill.  She has had this before.

## 2023-01-23 ENCOUNTER — Telehealth: Payer: Self-pay

## 2023-01-23 NOTE — Telephone Encounter (Signed)
[place] sent form to be signed . Placed in Dr Birdie Riddle to be signed folder

## 2023-01-24 ENCOUNTER — Telehealth: Payer: Self-pay | Admitting: Family Medicine

## 2023-01-24 MED ORDER — JEVITY 1.5 CAL/FIBER PO LIQD: 50.0000 mL/h | ORAL | 3 refills | Status: DC

## 2023-01-24 NOTE — Telephone Encounter (Signed)
Pt s husband is requesting a refill on Rx Jebity 1.5  I have looked thru med list and I do not see this on current medication list , Called husband to verify if this is correct and he stated yes . Please advise if pt is taking this medications I did not see a change in last two notes

## 2023-01-24 NOTE — Telephone Encounter (Signed)
Caller name: [facility]  On DPR?: Yes  Call back number: [phone]  Provider they see: [name], MD  Reason for call: Can you send the previous medication to this pharmacy instead of [facility]. Husband is there to pick it up.

## 2023-01-24 NOTE — Telephone Encounter (Signed)
Pt husband has been made aware c

## 2023-01-24 NOTE — Telephone Encounter (Signed)
Encourage patient to contact the pharmacy for refills or they can request refills through [facility]  (Please schedule appointment if patient has not been seen in over a year)    WHAT PHARMACY WOULD THEY LIKE THIS SENT TO:   MEDICATION NAME & DOSE:  Jebity 1.5 (8 ounces)  NOTES/COMMENTS FROM PATIENT:   Pts spouse is calling in stating that the pt is needing a refill on her food medication         Front office please notify patient: It takes 48-72 hours to process rx refill requests Ask patient to call pharmacy to ensure rx is ready before heading there.

## 2023-01-24 NOTE — Telephone Encounter (Signed)
They are asking for a refill on her Jevity- the nutritional supplement she receives via her Gtube.  I sent in a 90 day supply w/ refills

## 2023-01-24 NOTE — Telephone Encounter (Signed)
I'm not sure what medication needs to be sent to [facility]... if it's the Jevity nutritional supplement, please send to new location

## 2023-01-24 NOTE — Telephone Encounter (Signed)
Spoke to [place] in pharmacy at [facility] they needed a refill on Fanny cream . Refill given verbally but it was originally  called in to [facility] as stated by husband yesterday .

## 2023-01-28 ENCOUNTER — Other Ambulatory Visit: Payer: Self-pay

## 2023-01-30 ENCOUNTER — Other Ambulatory Visit: Payer: Self-pay | Admitting: Family Medicine

## 2023-01-30 DIAGNOSIS — R519 Headache, unspecified: Secondary | ICD-10-CM

## 2023-02-03 DIAGNOSIS — R627 Adult failure to thrive: Secondary | ICD-10-CM | POA: Diagnosis not present

## 2023-02-03 DIAGNOSIS — R1314 Dysphagia, pharyngoesophageal phase: Secondary | ICD-10-CM | POA: Diagnosis not present

## 2023-02-03 DIAGNOSIS — K222 Esophageal obstruction: Secondary | ICD-10-CM | POA: Diagnosis not present

## 2023-02-03 DIAGNOSIS — E440 Moderate protein-calorie malnutrition: Secondary | ICD-10-CM | POA: Diagnosis not present

## 2023-02-03 DIAGNOSIS — R6339 Other feeding difficulties: Secondary | ICD-10-CM | POA: Diagnosis not present

## 2023-02-04 ENCOUNTER — Ambulatory Visit (INDEPENDENT_AMBULATORY_CARE_PROVIDER_SITE_OTHER): Payer: Medicare Other

## 2023-02-04 VITALS — Ht 66.0 in | Wt 134.5 lb

## 2023-02-04 DIAGNOSIS — Z0000 Encounter for general adult medical examination without abnormal findings: Secondary | ICD-10-CM

## 2023-02-04 NOTE — Progress Notes (Signed)
Subjective:   Tasha Moss is a 87 y.o. female who presents for Medicare Annual (Subsequent) preventive examination.  Review of Systems    Virtual Visit via Telephone Note  I connected with  Cherlynn Perches on 02/04/23 at  1:00 PM EST by telephone and verified that I am speaking with the correct person using two identifiers.  Location: Patient: Home Provider: Office Persons participating in the virtual visit: patient/Nurse Health Advisor   I discussed the limitations, risks, security and privacy concerns of performing an evaluation and management service by telephone and the availability of in person appointments. The patient expressed understanding and agreed to proceed.  Interactive audio and video telecommunications were attempted between this nurse and patient, however failed, due to patient having technical difficulties OR patient did not have access to video capability.  We continued and completed visit with audio only.  Some vital signs may be absent or patient reported.   Criselda Peaches, LPN  Cardiac Risk Factors include: advanced age (>49mn, >>48women);hypertension;Other (see comment), Risk factor comments: Dx CHF     Objective:    Today's Vitals   02/04/23 1258  Weight: 134 lb 8 oz (61 kg)  Height: '5\' 6"'$  (1.676 m)   Body mass index is 21.71 kg/m.     02/04/2023    1:16 PM 09/02/2022    5:49 PM 08/25/2022    6:21 PM 01/07/2022    8:39 PM 01/07/2022    3:16 PM 12/20/2021    1:40 PM 11/29/2021    3:27 PM  Advanced Directives  Does Patient Have a Medical Advance Directive? Yes Yes Yes Yes Yes Yes Yes  Type of AParamedicof [place]Living will Healthcare Power of AMart[place]Living will HPl[place]Living will HSummersetLiving will HRipleyLiving will HLismanLiving will  Does patient want to make changes to medical advance directive? No  - Patient declined No - Patient declined No - Patient declined No - Patient declined   No - Patient declined  Copy of H[place]in Chart? Yes - validated most recent copy scanned in chart (See row information) Yes - validated most recent copy scanned in chart (See row information) No - copy requested No - copy requested  No - copy requested Yes - validated most recent copy scanned in chart (See row information)    Current Medications (verified) Outpatient Encounter Medications as of 02/04/2023  Medication Sig   acetaminophen (TYLENOL) 500 MG tablet Take 500 mg by mouth every 6 (six) hours as needed for moderate pain.   esomeprazole (NEXIUM) 40 MG capsule PLACE 1 CAPSULE IN FEEDING TUBE TWICE DAILY.   FLUoxetine (PROZAC) 20 MG capsule TAKE 1 CAPSULE BY MOUTH DAILY.   fluticasone (FLONASE) 50 MCG/ACT nasal spray Place 1 spray into both nostrils daily.   levothyroxine (SYNTHROID) 125 MCG tablet Take 1 tablet (125 mcg total) by mouth daily before breakfast.   loratadine (CLARITIN) 10 MG tablet Take 10 mg by mouth daily.   meloxicam (MOBIC) 7.5 MG tablet TAKE (1) TABLET BY MOUTH TWICE DAILY.   metoCLOPramide (REGLAN) 5 MG tablet Take 1 tablet (5 mg total) by mouth in the morning and at bedtime.   Nutritional Supplements (FEEDING SUPPLEMENT, JEVITY 1.5 CAL/FIBER,) LIQD Place 50 mL/hr into feeding tube continuous. Dose to be titrated per RD at home.   ondansetron (ZOFRAN) 8 MG tablet Take 1 tablet (8 mg total) by mouth  3 (three) times daily before meals. (Patient taking differently: Take 8 mg by mouth every 8 (eight) hours as needed for vomiting or nausea.)   ondansetron (ZOFRAN-ODT) 4 MG disintegrating tablet TAKE (1) TABLET BY MOUTH EVERY EIGHT HOURS AS NEEDED.   Polyvinyl Alcohol-Povidone (ARTIFICIAL TEARS) 5-6 MG/ML SOLN Place 2 drops into both eyes daily.   propranolol ER (INDERAL LA) 60 MG 24 hr capsule Take 60 mg by mouth daily. (Patient not taking: Reported on 11/05/2022)    triamcinolone ointment (KENALOG) 0.1 % Apply 1 application. topically 2 (two) times daily. (Patient not taking: Reported on 11/05/2022)   UNABLE TO FIND TUBE FEEDS: 1/2 carton (118 ml) Jevity 1.5 QID with 60 ml free water before and 60 ml free water after each TF bolus and 45 ml Prosource TF (or equivalent) BID.    Maintain this regimen x4 days.    Then increase to 1 carton (237 ml) Jevity 1.5 QID with 60 ml free water before and 60 ml free water after each TF bolus and 45 ml Prosource TF (or equivalent) BID.    This goal regimen will provide 1500 kcal (86% kcal need), 82 grams protein (96% protein need), and 1200 ml free water. (Patient not taking: Reported on 11/05/2022)   vitamin B-12 (CYANOCOBALAMIN) 250 MCG tablet Take 250 mcg by mouth daily. (Patient not taking: Reported on 11/05/2022)   Water For Irrigation, Sterile (FREE WATER) SOLN Place 100 mLs into feeding tube 6 (six) times daily.   No facility-administered encounter medications on file as of 02/04/2023.    Allergies (verified) Gabapentin, Other, and Morphine   History: Past Medical History:  Diagnosis Date   Allergy    seasonal & dust   Anxiety    Arthritis    DJD   Cellulitis    Family history of adverse reaction to anesthesia    n/v    GERD (gastroesophageal reflux disease)    History of vertigo    Hyperlipidemia    Hypertension    Hypothyroidism    PONV (postoperative nausea and vomiting)    Thyroid disease    hypothyroidism post Hashimoto's & partial thyroidectomy   Past Surgical History:  Procedure Laterality Date   BIOPSY  01/11/2022   Procedure: BIOPSY;  Surgeon: Irving Copas., MD;  Location: [facility];  Service: Gastroenterology;;   CATARACT EXTRACTION W/ INTRAOCULAR LENS  IMPLANT, BILATERAL     COLONOSCOPY     diverticulosis   ESOPHAGOGASTRODUODENOSCOPY (EGD) WITH PROPOFOL N/A 01/11/2022   Procedure: ESOPHAGOGASTRODUODENOSCOPY (EGD) WITH PROPOFOL;  Surgeon: Irving Copas., MD;   Location: [facility];  Service: Gastroenterology;  Laterality: N/A;   GASTROSTOMY N/A 01/17/2022   Procedure: OPEN GASTROSTOMY PLACEMENT;  Surgeon: Erroll Luna, MD;  Location: [facility];  Service: General;  Laterality: N/A;   IR GASTROSTOMY TUBE MOD SED  01/15/2022   JOINT REPLACEMENT  1995 & 1999   THR bilaterally    lumbar disc repair  1989   Dr Arrie Aran   SAVORY DILATION N/A 01/11/2022   Procedure: SAVORY DILATION;  Surgeon: Irving Copas., MD;  Location: [facility];  Service: Gastroenterology;  Laterality: N/A;   SPINE SURGERY     cervical ; Dr Vertell Limber   THYROIDECTOMY, PARTIAL     benign nodule   TONSILLECTOMY AND ADENOIDECTOMY     TOTAL SHOULDER ARTHROPLASTY Left 02/27/2016   Procedure: LEFT REVERSE TOTAL SHOULDER ARTHROPLASTY;  Surgeon: Renette Butters, MD;  Location: [place];  Service: Orthopedics;  Laterality: Left;   vitreous detachment  Dr Claudean Kinds   Family History  Problem Relation Age of Onset   Diabetes Mother    Hepatitis Mother        Hepatitis B,? etiology   Miscarriages / Stillbirths Mother    Stroke Mother 23   Heart disease Father        CHF   Cancer Paternal Aunt        GI cancers   Heart disease Paternal Aunt        CHF   Cancer Paternal Uncle        lung, GI   Heart disease Paternal Uncle        CHF   Colon cancer Neg Hx    Esophageal cancer Neg Hx    Pancreatic cancer Neg Hx    Rectal cancer Neg Hx    Stomach cancer Neg Hx    Social History   Socioeconomic History   Marital status: Married    Spouse name: Not on file   Number of children: 2   Years of education: Not on file   Highest education level: Not on file  Occupational History   Not on file  Tobacco Use   Smoking status: Never   Smokeless tobacco: Never  Vaping Use   Vaping Use: Never used  Substance and Sexual Activity   Alcohol use: No   Drug use: No   Sexual activity: Not on file  Other Topics Concern   Not on file  Social History Narrative   Married for  59 years    Daughter-nurse   Daughter- paralegal       Right handed   Social Determinants of Health   Financial Resource Strain: Low Risk  (02/04/2023)   Overall Financial Resource Strain (CARDIA)    Difficulty of Paying Living Expenses: Not hard at all  Food Insecurity: No Food Insecurity (02/04/2023)   Hunger Vital Sign    Worried About Running Out of Food in the Last Year: Never true    Ran Out of Food in the Last Year: Never true  Transportation Needs: No Transportation Needs (02/04/2023)   PRAPARE - Hydrologist (Medical): No    Lack of Transportation (Non-Medical): No  Physical Activity: Sufficiently Active (02/04/2023)   Exercise Vital Sign    Days of Exercise per Week: 5 days    Minutes of Exercise per Session: 30 min  Stress: No Stress Concern Present (02/04/2023)   [place]    Feeling of Stress : Not at all  Social Connections: [place] (02/04/2023)   Social Connection and Isolation Panel [NHANES]    Frequency of Communication with Friends and Family: More than three times a week    Frequency of Social Gatherings with Friends and Family: More than three times a week    Attends Religious Services: More than 4 times per year    Active Member of [employer] or Organizations: Yes    Attends Music therapist: More than 4 times per year    Marital Status: Married    Tobacco Counseling Counseling given: Not Answered   Clinical Intake:  Pre-visit preparation completed: No  Pain : No/denies pain     BMI - recorded: 21.71 Nutritional Status: BMI of 19-24  Normal Nutritional Risks: None Diabetes: No  How often do you need to have someone help you when you read instructions, pamphlets, or other written materials from your doctor or pharmacy?: 1 - Never  Diabetic?  No  Interpreter Needed?: No  Information entered by :: Rolene Arbour LPN   Activities  of Daily Living    02/04/2023    1:11 PM 09/03/2022    8:00 AM  In your present state of health, do you have any difficulty performing the following activities:  Hearing? 1 1  Comment Wears hearing aids   Vision? 0 0  Difficulty concentrating or making decisions? 0 0  Walking or climbing stairs? 1 1  Comment Uses a walker   Dressing or bathing? 1 0  Comment Husband assist   Doing errands, shopping? 1 1  Comment usband Diplomatic Services operational officer and eating ? Y   Comment Uses feeding tube   Using the Toilet? N   In the past six months, have you accidently leaked urine? Y   Comment Wears breifs.. Followed by PCP   Do you have problems with loss of bowel control? N   Managing your Medications? Y   Comment Husband assist   Managing your Finances? Y   Comment Husband assist   Housekeeping or managing your Housekeeping? N   Comment Husband assist     Patient Care Team: [name], MD as PCP - General (Family Medicine) Inda Castle, MD (Inactive) as Consulting Physician (Gastroenterology) Leta Baptist, MD as Consulting Physician (Otolaryngology) Erline Levine, MD as Consulting Physician (Neurosurgery) Renette Butters, MD as Attending Physician (Orthopedic Surgery) Harriett Sine, MD as Consulting Physician (Dermatology) Edythe Clarity, [facility] (Pharmacist) Tobi Bastos, RN as [place] [facility] Management  Indicate any recent Medical Services you may have received from other than [facility] providers in the past year (date may be approximate).     Assessment:   This is a routine wellness examination for Zynasia.  Hearing/Vision screen Hearing Screening - Comments:: Wears hearing aids Vision Screening - Comments:: Wears rx glasses - up to date with routine eye exams with  Dr Satira Sark  Dietary issues and exercise activities discussed: Exercise limited by: orthopedic condition(s)   Goals Addressed               This Visit's Progress     Patient stated  (pt-stated)        I want to be able to do more for myself as much as I can.       Depression Screen    02/04/2023    1:09 PM 09/13/2022    1:28 PM 09/12/2022    3:06 PM 08/28/2022    2:48 PM 07/01/2022    1:10 PM 12/20/2021    1:43 PM 12/20/2021    1:39 PM  PHQ 2/9 Scores  PHQ - 2 Score 0 0 0 0 0 0 0  PHQ- 9 Score  0  0 0      Fall Risk    02/04/2023    1:14 PM 09/13/2022    1:29 PM 09/12/2022    3:13 PM 08/28/2022    2:48 PM 07/01/2022    1:10 PM  [place] in the past year? 0 0 0 0 0  Number falls in past yr: 0    0  Injury with Fall? 0    0  Risk for fall due to : No Fall Risks No Fall Risks No Fall Risks No Fall Risks No Fall Risks  Follow up Falls prevention discussed Falls evaluation completed Falls evaluation completed Falls evaluation completed Falls evaluation completed    FALL RISK PREVENTION PERTAINING TO THE  HOME:  Any stairs in or around the home? Yes  If so, are there any without handrails? No  Home free of loose throw rugs in walkways, pet beds, electrical cords, etc? Yes  Adequate lighting in your home to reduce risk of falls? Yes   ASSISTIVE DEVICES UTILIZED TO PREVENT FALLS:  Life alert? No  Use of a cane, walker or w/c? Yes  Grab bars in the bathroom? Yes  Shower chair or bench in shower? Yes  Elevated toilet seat or a handicapped toilet? Yes   TIMED UP AND GO:  Was the test performed? No . Audio Visit   Cognitive Function:        Immunizations Immunization History  Administered Date(s) Administered   Fluad Quad(high Dose 65+) 09/06/2019, 09/06/2020, 08/24/2021   Influenza, High Dose Seasonal PF 09/16/2013, 09/13/2015, 10/09/2017, 09/18/2018   Influenza,inj,Quad PF,6+ Mos 09/20/2014, 09/09/2016   Moderna Sars-Covid-2 Vaccination 01/01/2020, 01/31/2020, 11/15/2020   PPD Test 03/01/2016   Pneumococcal Conjugate-13 01/03/2015   Pneumococcal-Unspecified 12/10/2011    TDAP status: Due, Education has been provided regarding the  importance of this vaccine. Advised may receive this vaccine at local pharmacy or Health Dept. Aware to provide a copy of the vaccination record if obtained from local pharmacy or Health Dept. Verbalized acceptance and understanding.  Flu Vaccine status: Up to date  Pneumococcal vaccine status: Up to date  Covid-19 vaccine status: Completed vaccines  Qualifies for Shingles Vaccine? Yes   Zostavax completed Yes   Shingrix Completed?: Yes  Screening Tests Health Maintenance  Topic Date Due   DTaP/Tdap/Td (1 - Tdap) Never done   INFLUENZA VACCINE  03/09/2023 (Originally 07/09/2022)   Medicare Annual Wellness (AWV)  02/05/2024   DEXA SCAN  Completed   HPV VACCINES  Aged Out   Pneumonia Vaccine 24+ Years old  Discontinued   COVID-19 Vaccine  Discontinued   Zoster Vaccines- Shingrix  Discontinued    Health Maintenance  Health Maintenance Due  Topic Date Due   DTaP/Tdap/Td (1 - Tdap) Never done    Colorectal cancer screening: No longer required.   Mammogram status: No longer required due to Age.  Bone Density status: Completed 07/11/14. Results reflect: Bone density results: OSTEOPOROSIS. Repeat every   years.  Lung Cancer Screening: (Low Dose CT Chest recommended if Age 1-80 years, 30 pack-year currently smoking OR have quit w/in 15years.) does not qualify.     Additional Screening:  Hepatitis C Screening: does not qualify; Completed   Vision Screening: Recommended annual ophthalmology exams for early detection of glaucoma and other disorders of the eye. Is the patient up to date with their annual eye exam?  Yes  Who is the provider or what is the name of the office in which the patient attends annual eye exams? Dr Satira Sark If pt is not established with a provider, would they like to be referred to a provider to establish care? No .   Dental Screening: Recommended annual dental exams for proper oral hygiene  Community Resource Referral / Chronic Care Management:  CRR  required this visit?  No   CCM required this visit?  No      Plan:     I have personally reviewed and noted the following in the patient's chart:   Medical and social history Use of alcohol, tobacco or illicit drugs  Current medications and supplements including opioid prescriptions. Patient is not currently taking opioid prescriptions. Functional ability and status Nutritional status Physical activity Advanced directives List of other physicians Hospitalizations,  surgeries, and ER visits in previous 12 months Vitals Screenings to include cognitive, depression, and falls Referrals and appointments  In addition, I have reviewed and discussed with patient certain preventive protocols, quality metrics, and best practice recommendations. A written personalized care plan for preventive services as well as general preventive health recommendations were provided to patient.     Criselda Peaches, LPN   [id number]   Nurse Notes: None

## 2023-02-04 NOTE — Patient Instructions (Addendum)
Ms. Tasha Moss , Thank you for taking time to come for your Medicare Wellness Visit. I appreciate your ongoing commitment to your health goals. Please review the following plan we discussed and let me know if I can assist you in the future.   These are the goals we discussed:  Goals       Patient Stated      Maintain independence        Patient Stated      Drink more water      Patient stated (pt-stated)      I want to be able to do more for myself as much as I can.      PharmD Care Plan      CARE PLAN ENTRY  Current Barriers:  Chronic Disease Management support, education, and care coordination needs related to Hyperlipidemia, Gastroesophageal Reflux Disease, Hypothyroidism, Depression, and RLS.  Hyperlipidemia Pharmacist Clinical Goal(s): Over the next 180 days, patient will work with PharmD and providers to maintain LDL goal < 100 Current regimen:  Pravastatin 40 mg daily  Fenofibrate 160 mg daily  Interventions: Continue current management Patient self care activities - Over the next 180 days, patient will: Continue current management Hypothyroidism Pharmacist Clinical Goal(s) Over the next 180 days, patient will work with PharmD and providers to maintain thyroid stimulating hormone in normal range. Current regimen:  Euthyrox 125 mcg once daily before breakfast Interventions: Counseled on appropriate use Patient self care activities - Over the next 180 days, patient will: Continue current management  Depression/Anxiety Pharmacist Clinical Goal(s) Over the next 180 days, patient will work with PharmD and providers to minimize depression/anxiety symptoms Current regimen: Fluoxetine 20 mh once daily  Interventions: Continue current management  Patient self care activities - Over the next 180 days, patient will: Continue current management GERD (acid reflux) Pharmacist Clinical Goal(s) Over the next 180 days, patient will work with PharmD and providers to minimize GERD  symptoms. Current regimen:  Pantoprazole 40 mg daily. Interventions: Continue current management Patient self care activities - Over the next 180 days, patient will: Continue current medication and avoid triggers discussed including mint, caffeine, chocolate, fried foods.   Medication management Pharmacist Clinical Goal(s): Over the next 180 days, patient will work with PharmD and providers to maintain optimal medication adherence Current pharmacy: [facility] Interventions Comprehensive medication review performed. Continue current medication management strategy Patient self care activities - Over the next 180 days, patient will: Focus on medication adherence by Continue current management Take medications as prescribed Report any questions or concerns to PharmD and/or provider(s) Initial goal documentation.      Track and Manage My Blood Pressure-Hypertension      Timeframe:  Long-Range Goal Priority:  High Start Date: 12/12/21                            Expected End Date: 06/11/22                      Follow Up Date 03/12/22    - check blood pressure weekly - choose a place to take my blood pressure (home, clinic or office, retail store) - write blood pressure results in a log or diary    Why is this important?   You won't feel high blood pressure, but it can still hurt your blood vessels.  High blood pressure can cause heart or kidney problems. It can also cause a stroke.  Making lifestyle changes  like losing a little weight or eating less salt will help.  Checking your blood pressure at home and at different times of the day can help to control blood pressure.  If the doctor prescribes medicine remember to take it the way the doctor ordered.  Call the office if you cannot afford the medicine or if there are questions about it.     Notes:         This is a list of the screening recommended for you and due dates:  Health Maintenance  Topic Date Due   DTaP/Tdap/Td  vaccine (1 - Tdap) Never done   Flu Shot  03/09/2023*   Medicare Annual Wellness Visit  02/05/2024   DEXA scan (bone density measurement)  Completed   HPV Vaccine  Aged Out   Pneumonia Vaccine  Discontinued   COVID-19 Vaccine  Discontinued   Zoster (Shingles) Vaccine  Discontinued  *Topic was postponed. The date shown is not the original due date.    Advanced directives: In Chart  Conditions/risks identified: None  Next appointment: Follow up in one year for your annual wellness visit     Preventive Care 65 Years and Older, Female Preventive care refers to lifestyle choices and visits with your health care provider that can promote health and wellness. What does preventive care include? A yearly physical exam. This is also called an annual well check. Dental exams once or twice a year. Routine eye exams. Ask your health care provider how often you should have your eyes checked. Personal lifestyle choices, including: Daily care of your teeth and gums. Regular physical activity. Eating a healthy diet. Avoiding tobacco and drug use. Limiting alcohol use. Practicing safe sex. Taking low-dose aspirin every day. Taking vitamin and mineral supplements as recommended by your health care provider. What happens during an annual well check? The services and screenings done by your health care provider during your annual well check will depend on your age, overall health, lifestyle risk factors, and family history of disease. Counseling  Your health care provider may ask you questions about your: Alcohol use. Tobacco use. Drug use. Emotional well-being. Home and relationship well-being. Sexual activity. Eating habits. History of falls. Memory and ability to understand (cognition). Work and work Statistician. Reproductive health. Screening  You may have the following tests or measurements: Height, weight, and BMI. Blood pressure. Lipid and cholesterol levels. These may be  checked every 5 years, or more frequently if you are over 85 years old. Skin check. Lung cancer screening. You may have this screening every year starting at age 85 if you have a 30-pack-year history of smoking and currently smoke or have quit within the past 15 years. Fecal occult blood test (FOBT) of the stool. You may have this test every year starting at age 38. Flexible sigmoidoscopy or colonoscopy. You may have a sigmoidoscopy every 5 years or a colonoscopy every 10 years starting at age 72. Hepatitis C blood test. Hepatitis B blood test. Sexually transmitted disease (STD) testing. Diabetes screening. This is done by checking your blood sugar (glucose) after you have not eaten for a while (fasting). You may have this done every 1-3 years. Bone density scan. This is done to screen for osteoporosis. You may have this done starting at age 21. Mammogram. This may be done every 1-2 years. Talk to your health care provider about how often you should have regular mammograms. Talk with your health care provider about your test results, treatment options, and if necessary, the  need for more tests. Vaccines  Your health care provider may recommend certain vaccines, such as: Influenza vaccine. This is recommended every year. Tetanus, diphtheria, and acellular pertussis (Tdap, Td) vaccine. You may need a Td booster every 10 years. Zoster vaccine. You may need this after age 68. Pneumococcal 13-valent conjugate (PCV13) vaccine. One dose is recommended after age 24. Pneumococcal polysaccharide (PPSV23) vaccine. One dose is recommended after age 82. Talk to your health care provider about which screenings and vaccines you need and how often you need them. This information is not intended to replace advice given to you by your health care provider. Make sure you discuss any questions you have with your health care provider. Document Released: 12/22/2015 Document Revised: 08/14/2016 Document Reviewed:  09/26/2015 Elsevier Interactive Patient Education  2017 [place] Prevention in the Home Falls can cause injuries. They can happen to people of all ages. There are many things you can do to make your home safe and to help prevent falls. What can I do on the outside of my home? Regularly fix the edges of walkways and driveways and fix any cracks. Remove anything that might make you trip as you walk through a door, such as a raised step or threshold. Trim any bushes or trees on the path to your home. Use bright outdoor lighting. Clear any walking paths of anything that might make someone trip, such as rocks or tools. Regularly check to see if handrails are loose or broken. Make sure that both sides of any steps have handrails. Any raised decks and porches should have guardrails on the edges. Have any leaves, snow, or ice cleared regularly. Use sand or salt on walking paths during winter. Clean up any spills in your garage right away. This includes oil or grease spills. What can I do in the bathroom? Use night lights. Install grab bars by the toilet and in the tub and shower. Do not use towel bars as grab bars. Use non-skid mats or decals in the tub or shower. If you need to sit down in the shower, use a plastic, non-slip stool. Keep the floor dry. Clean up any water that spills on the floor as soon as it happens. Remove soap buildup in the tub or shower regularly. Attach bath mats securely with double-sided non-slip rug tape. Do not have throw rugs and other things on the floor that can make you trip. What can I do in the bedroom? Use night lights. Make sure that you have a light by your bed that is easy to reach. Do not use any sheets or blankets that are too big for your bed. They should not hang down onto the floor. Have a firm chair that has side arms. You can use this for support while you get dressed. Do not have throw rugs and other things on the floor that can make you  trip. What can I do in the kitchen? Clean up any spills right away. Avoid walking on wet floors. Keep items that you use a lot in easy-to-reach places. If you need to reach something above you, use a strong step stool that has a grab bar. Keep electrical cords out of the way. Do not use floor polish or wax that makes floors slippery. If you must use wax, use non-skid floor wax. Do not have throw rugs and other things on the floor that can make you trip. What can I do with my stairs? Do not leave any items on the  stairs. Make sure that there are handrails on both sides of the stairs and use them. Fix handrails that are broken or loose. Make sure that handrails are as long as the stairways. Check any carpeting to make sure that it is firmly attached to the stairs. Fix any carpet that is loose or worn. Avoid having throw rugs at the top or bottom of the stairs. If you do have throw rugs, attach them to the floor with carpet tape. Make sure that you have a light switch at the top of the stairs and the bottom of the stairs. If you do not have them, ask someone to add them for you. What else can I do to help prevent falls? Wear shoes that: Do not have high heels. Have rubber bottoms. Are comfortable and fit you well. Are closed at the toe. Do not wear sandals. If you use a stepladder: Make sure that it is fully opened. Do not climb a closed stepladder. Make sure that both sides of the stepladder are locked into place. Ask someone to hold it for you, if possible. Clearly mark and make sure that you can see: Any grab bars or handrails. First and last steps. Where the edge of each step is. Use tools that help you move around (mobility aids) if they are needed. These include: Canes. Walkers. Scooters. Crutches. Turn on the lights when you go into a dark area. Replace any light bulbs as soon as they burn out. Set up your furniture so you have a clear path. Avoid moving your furniture  around. If any of your floors are uneven, fix them. If there are any pets around you, be aware of where they are. Review your medicines with your doctor. Some medicines can make you feel dizzy. This can increase your chance of falling. Ask your doctor what other things that you can do to help prevent falls. This information is not intended to replace advice given to you by your health care provider. Make sure you discuss any questions you have with your health care provider. Document Released: 09/21/2009 Document Revised: 05/02/2016 Document Reviewed: 12/30/2014 Elsevier Interactive Patient Education  2017 [employer].

## 2023-02-06 NOTE — Progress Notes (Addendum)
Subjective:   Tasha Moss is a 87 y.o. female who presents for Medicare Annual (Subsequent) preventive examination.  Review of Systems     Cardiac Risk Factors include: advanced age (>30mn, >>51women);hypertension;Other (see comment), Risk factor comments: Dx CHF     Objective:    Today's Vitals   02/04/23 1258  Weight: 134 lb 8 oz (61 kg)  Height: '5\' 6"'$  (1.676 m)   Body mass index is 21.71 kg/m.     02/04/2023    1:16 PM 09/02/2022    5:49 PM 08/25/2022    6:21 PM 01/07/2022    8:39 PM 01/07/2022    3:16 PM 12/20/2021    1:40 PM 11/29/2021    3:27 PM  Advanced Directives  Does Patient Have a Medical Advance Directive? Yes Yes Yes Yes Yes Yes Yes  Type of AParamedicof ABeryl JunctionLiving will Healthcare Power of [place]Living will [place]Living will HManitow[place]Living will HBeattystownLiving will HBowmanLiving will  Does patient want to make changes to medical advance directive? No - Patient declined No - Patient declined No - Patient declined No - Patient declined   No - Patient declined  Copy of HTetlinin Chart? Yes - validated most recent copy scanned in chart (See row information) Yes - validated most recent copy scanned in chart (See row information) No - copy requested No - copy requested  No - copy requested Yes - validated most recent copy scanned in chart (See row information)    Current Medications (verified) Outpatient Encounter Medications as of 02/04/2023  Medication Sig   acetaminophen (TYLENOL) 500 MG tablet Take 500 mg by mouth every 6 (six) hours as needed for moderate pain.   esomeprazole (NEXIUM) 40 MG capsule PLACE 1 CAPSULE IN FEEDING TUBE TWICE DAILY.   FLUoxetine (PROZAC) 20 MG capsule TAKE 1 CAPSULE BY MOUTH DAILY.   fluticasone (FLONASE) 50 MCG/ACT nasal spray Place 1 spray into both nostrils daily.    levothyroxine (SYNTHROID) 125 MCG tablet Take 1 tablet (125 mcg total) by mouth daily before breakfast.   loratadine (CLARITIN) 10 MG tablet Take 10 mg by mouth daily.   meloxicam (MOBIC) 7.5 MG tablet TAKE (1) TABLET BY MOUTH TWICE DAILY.   metoCLOPramide (REGLAN) 5 MG tablet Take 1 tablet (5 mg total) by mouth in the morning and at bedtime.   Nutritional Supplements (FEEDING SUPPLEMENT, JEVITY 1.5 CAL/FIBER,) LIQD Place 50 mL/hr into feeding tube continuous. Dose to be titrated per RD at home.   ondansetron (ZOFRAN) 8 MG tablet Take 1 tablet (8 mg total) by mouth 3 (three) times daily before meals. (Patient taking differently: Take 8 mg by mouth every 8 (eight) hours as needed for vomiting or nausea.)   ondansetron (ZOFRAN-ODT) 4 MG disintegrating tablet TAKE (1) TABLET BY MOUTH EVERY EIGHT HOURS AS NEEDED.   Polyvinyl Alcohol-Povidone (ARTIFICIAL TEARS) 5-6 MG/ML SOLN Place 2 drops into both eyes daily.   propranolol ER (INDERAL LA) 60 MG 24 hr capsule Take 60 mg by mouth daily. (Patient not taking: Reported on 11/05/2022)   triamcinolone ointment (KENALOG) 0.1 % Apply 1 application. topically 2 (two) times daily. (Patient not taking: Reported on 11/05/2022)   UNABLE TO FIND TUBE FEEDS: 1/2 carton (118 ml) Jevity 1.5 QID with 60 ml free water before and 60 ml free water after each TF bolus and 45 ml Prosource TF (or equivalent) BID.  Maintain this regimen x4 days.    Then increase to 1 carton (237 ml) Jevity 1.5 QID with 60 ml free water before and 60 ml free water after each TF bolus and 45 ml Prosource TF (or equivalent) BID.    This goal regimen will provide 1500 kcal (86% kcal need), 82 grams protein (96% protein need), and 1200 ml free water. (Patient not taking: Reported on 11/05/2022)   vitamin B-12 (CYANOCOBALAMIN) 250 MCG tablet Take 250 mcg by mouth daily. (Patient not taking: Reported on 11/05/2022)   Water For Irrigation, Sterile (FREE WATER) SOLN Place 100 mLs into feeding tube 6  (six) times daily.   No facility-administered encounter medications on file as of 02/04/2023.    Allergies (verified) Gabapentin, Other, and Morphine   History: Past Medical History:  Diagnosis Date   Allergy    seasonal & dust   Anxiety    Arthritis    DJD   Cellulitis    Family history of adverse reaction to anesthesia    n/v    GERD (gastroesophageal reflux disease)    History of vertigo    Hyperlipidemia    Hypertension    Hypothyroidism    PONV (postoperative nausea and vomiting)    Thyroid disease    hypothyroidism post Hashimoto's & partial thyroidectomy   Past Surgical History:  Procedure Laterality Date   BIOPSY  01/11/2022   Procedure: BIOPSY;  Surgeon: Irving Copas., MD;  Location: [facility];  Service: Gastroenterology;;   CATARACT EXTRACTION W/ INTRAOCULAR LENS  IMPLANT, BILATERAL     COLONOSCOPY     diverticulosis   ESOPHAGOGASTRODUODENOSCOPY (EGD) WITH PROPOFOL N/A 01/11/2022   Procedure: ESOPHAGOGASTRODUODENOSCOPY (EGD) WITH PROPOFOL;  Surgeon: Irving Copas., MD;  Location: [facility];  Service: Gastroenterology;  Laterality: N/A;   GASTROSTOMY N/A 01/17/2022   Procedure: OPEN GASTROSTOMY PLACEMENT;  Surgeon: Erroll Luna, MD;  Location: [facility];  Service: General;  Laterality: N/A;   IR GASTROSTOMY TUBE MOD SED  01/15/2022   JOINT REPLACEMENT  1995 & 1999   THR bilaterally    lumbar disc repair  1989   Dr Arrie Aran   SAVORY DILATION N/A 01/11/2022   Procedure: SAVORY DILATION;  Surgeon: Irving Copas., MD;  Location: [facility];  Service: Gastroenterology;  Laterality: N/A;   SPINE SURGERY     cervical ; Dr Vertell Limber   THYROIDECTOMY, PARTIAL     benign nodule   TONSILLECTOMY AND ADENOIDECTOMY     TOTAL SHOULDER ARTHROPLASTY Left 02/27/2016   Procedure: LEFT REVERSE TOTAL SHOULDER ARTHROPLASTY;  Surgeon: Renette Butters, MD;  Location: [place];  Service: Orthopedics;  Laterality: Left;   vitreous detachment     Dr  Claudean Kinds   Family History  Problem Relation Age of Onset   Diabetes Mother    Hepatitis Mother        Hepatitis B,? etiology   Miscarriages / Stillbirths Mother    Stroke Mother 8   Heart disease Father        CHF   Cancer Paternal Aunt        GI cancers   Heart disease Paternal Aunt        CHF   Cancer Paternal Uncle        lung, GI   Heart disease Paternal Uncle        CHF   Colon cancer Neg Hx    Esophageal cancer Neg Hx    Pancreatic cancer Neg Hx    Rectal cancer Neg  Hx    Stomach cancer Neg Hx    Social History   Socioeconomic History   Marital status: Married    Spouse name: Not on file   Number of children: 2   Years of education: Not on file   Highest education level: Not on file  Occupational History   Not on file  Tobacco Use   Smoking status: Never   Smokeless tobacco: Never  Vaping Use   Vaping Use: Never used  Substance and Sexual Activity   Alcohol use: No   Drug use: No   Sexual activity: Not on file  Other Topics Concern   Not on file  Social History Narrative   Married for 47 years    Daughter-nurse   Daughter- paralegal       Right handed   Social Determinants of Health   Financial Resource Strain: Low Risk  (02/04/2023)   Overall Financial Resource Strain (CARDIA)    Difficulty of Paying Living Expenses: Not hard at all  Food Insecurity: No Food Insecurity (02/04/2023)   Hunger Vital Sign    Worried About Running Out of Food in the Last Year: Never true    Ran Out of Food in the Last Year: Never true  Transportation Needs: No Transportation Needs (02/04/2023)   PRAPARE - Hydrologist (Medical): No    Lack of Transportation (Non-Medical): No  Physical Activity: Sufficiently Active (02/04/2023)   Exercise Vital Sign    Days of Exercise per Week: 5 days    Minutes of Exercise per Session: 30 min  Stress: No Stress Concern Present (02/04/2023)   [place]    Feeling of Stress : Not at all  Social Connections: [place] (02/04/2023)   Social Connection and Isolation Panel [NHANES]    Frequency of Communication with Friends and Family: More than three times a week    Frequency of Social Gatherings with Friends and Family: More than three times a week    Attends Religious Services: More than 4 times per year    Active Member of [employer] or Organizations: Yes    Attends Music therapist: More than 4 times per year    Marital Status: Married    Tobacco Counseling Counseling given: Not Answered   Clinical Intake:  Pre-visit preparation completed: No  Pain : No/denies pain     BMI - recorded: 21.71 Nutritional Status: BMI of 19-24  Normal Nutritional Risks: None Diabetes: No  How often do you need to have someone help you when you read instructions, pamphlets, or other written materials from your doctor or pharmacy?: 1 - Never  Diabetic?  Interpreter Needed?: No  Information entered by :: Rolene Arbour LPN   Activities of Daily Living    02/04/2023    1:11 PM 09/03/2022    8:00 AM  In your present state of health, do you have any difficulty performing the following activities:  Hearing? 1 1  Comment Wears hearing aids   Vision? 0 0  Difficulty concentrating or making decisions? 0 0  Walking or climbing stairs? 1 1  Comment Uses a walker   Dressing or bathing? 1 0  Comment Husband assist   Doing errands, shopping? 1 1  Comment usband Diplomatic Services operational officer and eating ? Y   Comment Uses feeding tube   Using the Toilet? N   In the past six months, have you accidently leaked urine?  Y   Comment Wears breifs.. Followed by PCP   Do you have problems with loss of bowel control? N   Managing your Medications? Y   Comment Husband assist   Managing your Finances? Y   Comment Husband assist   Housekeeping or managing your Housekeeping? N   Comment Husband assist     Patient Care  Team: [name], MD as PCP - General (Family Medicine) Inda Castle, MD (Inactive) as Consulting Physician (Gastroenterology) Leta Baptist, MD as Consulting Physician (Otolaryngology) Erline Levine, MD as Consulting Physician (Neurosurgery) Renette Butters, MD as Attending Physician (Orthopedic Surgery) Harriett Sine, MD as Consulting Physician (Dermatology) Edythe Clarity, [facility] (Pharmacist) Tobi Bastos, RN as [place] Management  Indicate any recent Medical Services you may have received from other than [facility] providers in the past year (date may be approximate).     Assessment:   This is a routine wellness examination for Seleena.  Hearing/Vision screen Hearing Screening - Comments:: Wears hearing aids Vision Screening - Comments:: Wears rx glasses - up to date with routine eye exams with  Dr Satira Sark  Dietary issues and exercise activities discussed: Exercise limited by: orthopedic condition(s)   Goals Addressed               This Visit's Progress     Patient stated (pt-stated)        I want to be able to do more for myself as much as I can.       Depression Screen    02/04/2023    1:09 PM 09/13/2022    1:28 PM 09/12/2022    3:06 PM 08/28/2022    2:48 PM 07/01/2022    1:10 PM 12/20/2021    1:43 PM 12/20/2021    1:39 PM  PHQ 2/9 Scores  PHQ - 2 Score 0 0 0 0 0 0 0  PHQ- 9 Score  0  0 0      Fall Risk    02/04/2023    1:14 PM 09/13/2022    1:29 PM 09/12/2022    3:13 PM 08/28/2022    2:48 PM 07/01/2022    1:10 PM  [place] in the past year? 0 0 0 0 0  Number falls in past yr: 0    0  Injury with Fall? 0    0  Risk for fall due to : No Fall Risks No Fall Risks No Fall Risks No Fall Risks No Fall Risks  Follow up Falls prevention discussed Falls evaluation completed Falls evaluation completed Falls evaluation completed Falls evaluation completed    FALL RISK PREVENTION PERTAINING TO THE HOME:  Any stairs in or  around the home?  If so, are there any without handrails?  Home free of loose throw rugs in walkways, pet beds, electrical cords, etc?  Adequate lighting in your home to reduce risk of falls?   ASSISTIVE DEVICES UTILIZED TO PREVENT FALLS:  Life alert?  Use of a cane, walker or w/c?  Grab bars in the bathroom?  Shower chair or bench in shower?  Elevated toilet seat or a handicapped toilet?   TIMED UP AND GO:  Was the test performed? .  Length of time to ambulate 10 feet:  sec.     Cognitive Function:        02/07/2023   10:28 AM 02/06/2023    3:48 PM  6CIT Screen  What Year? 0 points 0 points  What  month? 0 points 0 points  What time? 0 points 0 points  Count back from 20 0 points 0 points  Months in reverse 0 points 0 points  Repeat phrase 0 points 0 points  Total Score 0 points 0 points    Immunizations Immunization History  Administered Date(s) Administered   Fluad Quad(high Dose 65+) 09/06/2019, 09/06/2020, 08/24/2021   Influenza, High Dose Seasonal PF 09/16/2013, 09/13/2015, 10/09/2017, 09/18/2018   Influenza,inj,Quad PF,6+ Mos 09/20/2014, 09/09/2016   Moderna Sars-Covid-2 Vaccination 01/01/2020, 01/31/2020, 11/15/2020   PPD Test 03/01/2016   Pneumococcal Conjugate-13 01/03/2015   Pneumococcal-Unspecified 12/10/2011            Qualifies for Shingles Vaccine?   Zostavax completed     Screening Tests Health Maintenance  Topic Date Due   DTaP/Tdap/Td (1 - Tdap) Never done   INFLUENZA VACCINE  03/09/2023 (Originally 07/09/2022)   Medicare Annual Wellness (AWV)  02/05/2024   DEXA SCAN  Completed   HPV VACCINES  Aged Out   Pneumonia Vaccine 78+ Years old  Discontinued   COVID-19 Vaccine  Discontinued   Zoster Vaccines- Shingrix  Discontinued    Health Maintenance  Health Maintenance Due  Topic Date Due   DTaP/Tdap/Td (1 - Tdap) Never done          Lung Cancer Screening: (Low Dose CT Chest recommended if Age 19-80 years, 30 pack-year  currently smoking OR have quit w/in 15years.)  qualify.   Lung Cancer Screening Referral:   Additional Screening:  Hepatitis C Screening:  qualify; Completed   Vision Screening: Recommended annual ophthalmology exams for early detection of glaucoma and other disorders of the eye. Is the patient up to date with their annual eye exam?   Who is the provider or what is the name of the office in which the patient attends annual eye exams?  If pt is not established with a provider, would they like to be referred to a provider to establish care? .   Dental Screening: Recommended annual dental exams for proper oral hygiene  Community Resource Referral / Chronic Care Management: CRR required this visit?    CCM required this visit?       Plan:     I have personally reviewed and noted the following in the patient's chart:   Medical and social history Use of alcohol, tobacco or illicit drugs  Current medications and supplements including opioid prescriptions.  Functional ability and status Nutritional status Physical activity Advanced directives List of other physicians Hospitalizations, surgeries, and ER visits in previous 12 months Vitals Screenings to include cognitive, depression, and falls Referrals and appointments  In addition, I have reviewed and discussed with patient certain preventive protocols, quality metrics, and best practice recommendations. A written personalized care plan for preventive services as well as general preventive health recommendations were provided to patient.     Criselda Peaches, LPN   [id number]   Nurse Notes: This note is an addendum to AWV completed on 02/04/23. 6CIT was done with score of 0 also depression screening score was 0.

## 2023-02-06 NOTE — Addendum Note (Signed)
Addended by: Criselda Peaches on: 02/06/2023 03:56 PM   Modules accepted: Orders

## 2023-02-10 ENCOUNTER — Ambulatory Visit: Payer: Medicare Other | Admitting: Gastroenterology

## 2023-02-10 ENCOUNTER — Encounter: Payer: Self-pay | Admitting: Gastroenterology

## 2023-02-10 VITALS — BP 98/68 | HR 96 | Ht 63.0 in | Wt 137.5 lb

## 2023-02-10 DIAGNOSIS — K9423 Gastrostomy malfunction: Secondary | ICD-10-CM | POA: Diagnosis not present

## 2023-02-10 DIAGNOSIS — K219 Gastro-esophageal reflux disease without esophagitis: Secondary | ICD-10-CM

## 2023-02-10 DIAGNOSIS — K449 Diaphragmatic hernia without obstruction or gangrene: Secondary | ICD-10-CM | POA: Diagnosis not present

## 2023-02-10 DIAGNOSIS — R110 Nausea: Secondary | ICD-10-CM | POA: Diagnosis not present

## 2023-02-10 NOTE — Progress Notes (Signed)
Chief Complaint:    Nausea  GI History:  87 year old female with a history of  anxiety, arthritis, hypertension, hyperlipidemia, CHF, hypothyroidism s/p partial thyroidectomy, vertigo, Covid 19 infection 11/2021, anemia, herniated cervical disc with spondylosis degenerative disk disease with radiculopathy s/p cervical decompression and fusion C4-5 2008, esophageal stricture 2011 and GERD.   - 12/18/2021: Evaluated in the GI clinic for dysphagia with weight loss starting after hospitalization with left hip fracture s/p reduction in rehab in 11/2019.  Reported solid food dysphagia, decreased appetite, early satiety with 50 pound weight loss over 1 year. - 12/27/2021: UGI series: aspiration of contrast into the trachea at the level of the mainstem bronchi therefore the study was terminated and the barium tablet was not administered. A duodenal diverticula was noted - 01/07/2022: Hospital admission for persistent dysphagia, generalized weakness and failure to thrive.  Hospital course complicated by CHF exacerbation which improved with IV diuresis - 01/08/2022: MBS: severe pharyngocervical dysphagia with both sensorimotor and obstructive dysphagia resulting in gross retention of barium throughout pharynx and aspiration of thin liquids. (Remote history of cervical disc s/p C4-5 fusion in 2008, cervical disc disease possibly contributing to her dysphagia). A thick nectar liquid diet, follow solids with liquids and to remain seated 90 degrees after meals.  - 01/11/2022: EGD: mild mucosal wrent at the UES, a 2cm hiatal hernia without evidence of any gross esophageal lesion. Esophageal biopsies showed esophageal eosinophilia treated with PPI bid. No. H. Pylori. - 01/15/2022: IR attempted PEG tube placement but not done due to superimposed colon - 01/17/2022: PEG tube placement by General Surgery and started on tube feedings - 02/18/2022: Follow-up with: Carl Best in the GI clinic.  Concern for  regurgitation/reflux of tube feeds.  Ordered CXR, abdominal x-ray, UGI series, recommended HOB elevation to reduce risk of aspiration, increased esomeprazole to 40 mg twice daily via PEG - 02/18/2022: CXR: Likely small bilateral pleural effusions without focal infiltrate.  Abdominal x-ray with feeding tube in place and otherwise unremarkable -08/25/2022: ER evaluation for nausea/vomiting.  CT A/P n/f gastrostomy in place but otherwise no bowel wall thickening, obstruction.  Diverticulosis without diverticulitis.  CXR unremarkable.  Normal CBC, liver enzymes.  Sodium 133 (at baseline) with otherwise normal renal function.  Diagnosed with cellulitis and discharged with Keflex. - 09/02/2028, 2023.  Hospital admission for cellulitis, generalized weakness, nausea/vomiting.         Endoscopic History: EGD 01/11/2022: - No gross lesions in esophagus. Biopsied. Dilated. Mild wrent at the UES noted. - Z-line regular, 39 cm from the incisors. - 2 cm hiatal hernia. Erythematous mucosa in the stomach - biopsied. - No gross lesions in the duodenal bulb, in the first portion of the duodenum and in the second portion of the duodenum. Biopsied.   A. STOMACH, BIOPSY:  - Antral and oxyntic mucosa with hyperemia.  - No Helicobacter pylori identified.   B. DUODENUM, BIOPSY:  - Duodenal mucosa with intact villous architecture.  - No villous atrophy or increased intraepithelial lymphocytes.   C. ESOPHAGUS, RANDOM, BIOPSY:  - Squamous mucosa with focal, mildly increased eosinophils.  - Focally up to 17 per high-power field.    EGD 03/01/2010: showed gastritis, a stone found at the GE junction, early esophageal stricture with few esophageal erosions. Esophagus dilated with 18 mm dilator with mild resistance otherwise was a normal exam. Path report showed gastric body mucosa with minimal chronic inflammation, no evidence of Helicobacter pylori, intestinal metaplasia, dysplasia or malignancy identified.    Colonoscopy  by Dr.  Deatra Ina 5/10/2 2011: showed moderate diverticulosis to the sigmoid colon, no polyps.    HPI:     Patient is a 87 y.o. female presenting to the Gastroenterology Clinic for follow-up.  Last seen by me on 11/05/2022.  Was doing TF 18 hours/day (4 PM-10 AM), with nausea in the a.m., but tended to be better in the afternoon.  Was no longer vomiting.  Had suspected nausea from continuous TF and trialed Reglan 5 mg prior to starting TF and halfway through TF session, and if no improvement, may be nutrition clinic assistance in modifying TF to bolus feeds instead.  Had also referred over to the surgical clinic for possible exchange for a low-profile MIC-KEY PEG, but she never scheduled that referral.  Today, she states the nausea is much improved since starting the Reglan. Still has some reflux/regurgitation at times, but overall much improved.  Sleeps with HOB elevated at 30 degrees (mechanical bed).  Does report having some leaking around the PEG tube.  No new imaging or labs since last appointment.   Review of systems:     No chest pain, no SOB, no fevers, no urinary sx   Past Medical History:  Diagnosis Date   Allergy    seasonal & dust   Anxiety    Arthritis    DJD   Cellulitis    Family history of adverse reaction to anesthesia    n/v    GERD (gastroesophageal reflux disease)    History of vertigo    Hyperlipidemia    Hypertension    Hypothyroidism    PONV (postoperative nausea and vomiting)    Thyroid disease    hypothyroidism post Hashimoto's & partial thyroidectomy    Patient's surgical history, family medical history, social history, medications and allergies were all reviewed in Epic    Current Outpatient Medications  Medication Sig Dispense Refill   acetaminophen (TYLENOL) 500 MG tablet Take 500 mg by mouth every 6 (six) hours as needed for moderate pain.     esomeprazole (NEXIUM) 40 MG capsule PLACE 1 CAPSULE IN FEEDING TUBE TWICE DAILY. 60 capsule 0    FLUoxetine (PROZAC) 20 MG capsule TAKE 1 CAPSULE BY MOUTH DAILY. 30 capsule 0   fluticasone (FLONASE) 50 MCG/ACT nasal spray Place 1 spray into both nostrils daily.     levothyroxine (SYNTHROID) 125 MCG tablet Take 1 tablet (125 mcg total) by mouth daily before breakfast. 90 tablet 1   loratadine (CLARITIN) 10 MG tablet Take 10 mg by mouth daily.     meloxicam (MOBIC) 7.5 MG tablet TAKE (1) TABLET BY MOUTH TWICE DAILY. 60 tablet 0   metoCLOPramide (REGLAN) 5 MG tablet Take 1 tablet (5 mg total) by mouth in the morning and at bedtime. 180 tablet 3   nutrition supplement, JUVEN, (JUVEN) PACK Place 1 packet into feeding tube daily.     Nutritional Supplements (FEEDING SUPPLEMENT, JEVITY 1.5 CAL/FIBER,) LIQD Place 50 mL/hr into feeding tube continuous. Dose to be titrated per RD at home. 108000 mL 3   ondansetron (ZOFRAN) 8 MG tablet Take 1 tablet (8 mg total) by mouth 3 (three) times daily before meals. (Patient taking differently: Take 8 mg by mouth every 8 (eight) hours as needed for vomiting or nausea.) 90 tablet 0   ondansetron (ZOFRAN-ODT) 4 MG disintegrating tablet TAKE (1) TABLET BY MOUTH EVERY EIGHT HOURS AS NEEDED. 10 tablet 0   Polyvinyl Alcohol-Povidone (ARTIFICIAL TEARS) 5-6 MG/ML SOLN Place 2 drops into both eyes daily.     propranolol  ER (INDERAL LA) 60 MG 24 hr capsule Take 60 mg by mouth daily.     triamcinolone ointment (KENALOG) 0.1 % Apply 1 application. topically 2 (two) times daily. 90 g 1   UNABLE TO FIND TUBE FEEDS: 1/2 carton (118 ml) Jevity 1.5 QID with 60 ml free water before and 60 ml free water after each TF bolus and 45 ml Prosource TF (or equivalent) BID.    Maintain this regimen x4 days.    Then increase to 1 carton (237 ml) Jevity 1.5 QID with 60 ml free water before and 60 ml free water after each TF bolus and 45 ml Prosource TF (or equivalent) BID.    This goal regimen will provide 1500 kcal (86% kcal need), 82 grams protein (96% protein need), and 1200 ml free  water. 1 Mutually Defined 0   vitamin B-12 (CYANOCOBALAMIN) 250 MCG tablet Take 250 mcg by mouth daily.     Water For Irrigation, Sterile (FREE WATER) SOLN Place 100 mLs into feeding tube 6 (six) times daily.     No current facility-administered medications for this visit.    Physical Exam:     BP 98/68 (BP Location: Left Arm, Patient Position: Sitting, Cuff Size: Normal)   Pulse 96   Ht '5\' 3"'$  (1.6 m)   Wt 137 lb 8 oz (62.4 kg)   BMI 24.36 kg/m   GENERAL:  Pleasant female in NAD PSYCH: : Cooperative, normal affect ABDOMEN: PEG in place.  External bumper cracked and very loose fitting.  External bumper loose to the skin with gauze underneath with small amounts of drainage.  PEG freely mobile 360 degrees.  Abdomen otherwise nondistended, soft, nontender.     IMPRESSION and PLAN:    1) Nausea Symptoms have significantly improved since starting Reglan - Discussed the risk/benefit profile of Reglan at length today.  Specifically discussed ADR profile, to include risk of tardive dyskinesia with prolonged use.  Given the very good clinical response, she feels that the benefits outweigh the risks and would like to continue with this medication.  2) Hiatal hernia 3) GERD Reflux symptoms have also improved since starting Reglan, likely from the promotility effect.  Will continue to remain at risk for reflux given the nocturnal TF that she receives, but she prefers this feeding schedule rather than bolus feeding throughout the day. - Continue with Nexium - Continuing Reglan as above - Continue sleeping with HOB elevated  4) PEG tube in place External bumper is cracked and loose fitting to the skin.  Suspect PEG continues to move intermittently back-and-forth which promotes drainage from the mature tract. - Will message Dr. Brantley Stage from the Surgical clinic to inquire about replacement with a low-profile MIC-KEY PEG    RTC prn  I spent over 30 minutes of time, including in depth chart  review, independent review of results as outlined above, communicating results with the patient directly, face-to-face time with the patient, coordinating care, and ordering studies and medications as appropriate, and documentation.          Lavena Bullion ,DO, FACG 02/10/2023, 1:53 PM

## 2023-02-10 NOTE — Patient Instructions (Signed)
_______________________________________________________  If your blood pressure at your visit was 140/90 or greater, please contact your primary care physician to follow up on this.  _______________________________________________________  If you are age 85 or older, your body mass index should be between 23-30. Your Body mass index is 24.36 kg/m. If this is out of the aforementioned range listed, please consider follow up with your Primary Care Provider.  If you are age 39 or younger, your body mass index should be between 19-25. Your Body mass index is 24.36 kg/m. If this is out of the aformentioned range listed, please consider follow up with your Primary Care Provider.   ________________________________________________________  The [facility] providers would like to encourage you to use [facility] to communicate with providers for non-urgent requests or questions.  Due to long hold times on the telephone, sending your provider a message by [facility] may be a faster and more efficient way to get a response.  Please allow 48 business hours for a response.  Please remember that this is for non-urgent requests.  _______________________________________________________  Follow up as needed  It was a pleasure to see you today!  Vito Cirigliano, D.O.

## 2023-02-12 ENCOUNTER — Ambulatory Visit: Payer: Medicare Other | Admitting: Podiatry

## 2023-02-12 DIAGNOSIS — B351 Tinea unguium: Secondary | ICD-10-CM

## 2023-02-12 DIAGNOSIS — M79674 Pain in right toe(s): Secondary | ICD-10-CM

## 2023-02-12 DIAGNOSIS — M79675 Pain in left toe(s): Secondary | ICD-10-CM | POA: Diagnosis not present

## 2023-02-12 NOTE — Progress Notes (Signed)
   SUBJECTIVE Patient presents to office today complaining of elongated, thickened nails that cause pain while ambulating in shoes.  Patient is unable to trim their own nails. Patient is here for further evaluation and treatment.  Past Medical History:  Diagnosis Date   Allergy    seasonal & dust   Anxiety    Arthritis    DJD   Cellulitis    Family history of adverse reaction to anesthesia    n/v    GERD (gastroesophageal reflux disease)    History of vertigo    Hyperlipidemia    Hypertension    Hypothyroidism    PONV (postoperative nausea and vomiting)    Thyroid disease    hypothyroidism post Hashimoto's & partial thyroidectomy    OBJECTIVE General Patient is awake, alert, and oriented x 3 and in no acute distress. Derm Skin is dry and supple bilateral. Negative open lesions or macerations. Remaining integument unremarkable. Nails are tender, long, thickened and dystrophic with subungual debris, consistent with onychomycosis, 1-5 bilateral. No signs of infection noted. Vasc  DP and PT pedal pulses palpable bilaterally. Temperature gradient within normal limits.  Neuro Epicritic and protective threshold sensation grossly intact bilaterally.  Musculoskeletal Exam No symptomatic pedal deformities noted bilateral. Muscular strength within normal limits.  ASSESSMENT 1.  Pain due to onychomycosis of toenails both  PLAN OF CARE 1. Patient evaluated today.  2. Instructed to maintain good pedal hygiene and foot care.  3. Mechanical debridement of nails 1-5 bilaterally performed using a nail nipper. Filed with dremel without incident.  4. Return to clinic in 3 mos.    Edrick Kins, DPM [facility]  Dr. Edrick Kins, DPM    [address], [place] [address]                Office [phone]  Fax [phone]

## 2023-02-17 ENCOUNTER — Encounter (HOSPITAL_COMMUNITY): Payer: Self-pay | Admitting: Interventional Radiology

## 2023-02-17 ENCOUNTER — Other Ambulatory Visit: Payer: Self-pay

## 2023-02-17 DIAGNOSIS — K9423 Gastrostomy malfunction: Secondary | ICD-10-CM

## 2023-02-17 NOTE — Progress Notes (Signed)
Spoke to the husband and he is aware that IR at [facility] will call them to see about exchanging peg tube

## 2023-02-20 ENCOUNTER — Ambulatory Visit (HOSPITAL_COMMUNITY)
Admission: RE | Admit: 2023-02-20 | Discharge: 2023-02-20 | Disposition: A | Discharge status: Home / Self Care | Payer: Medicare Other | Source: Ambulatory Visit | Attending: Gastroenterology | Admitting: Gastroenterology

## 2023-02-20 ENCOUNTER — Other Ambulatory Visit: Payer: Self-pay | Admitting: Gastroenterology

## 2023-02-20 DIAGNOSIS — K9423 Gastrostomy malfunction: Secondary | ICD-10-CM

## 2023-02-20 DIAGNOSIS — Z431 Encounter for attention to gastrostomy: Secondary | ICD-10-CM | POA: Diagnosis not present

## 2023-02-20 HISTORY — PX: IR REPLC GASTRO/COLONIC TUBE PERCUT W/FLUORO: IMG2333

## 2023-02-20 MED ORDER — LIDOCAINE VISCOUS HCL 2 % MT SOLN
OROMUCOSAL | Status: AC
  Administered 2023-02-20: 3 mL via INTRADERMAL
  Filled 2023-02-20: qty 15

## 2023-02-20 MED ORDER — IOHEXOL 300 MG/ML  SOLN
50.0000 mL | Freq: Once | INTRAMUSCULAR | Status: AC | PRN
  Administered 2023-02-20: 15 mL

## 2023-02-21 ENCOUNTER — Encounter (HOSPITAL_COMMUNITY): Payer: Self-pay | Admitting: Radiology

## 2023-02-21 ENCOUNTER — Other Ambulatory Visit: Payer: Self-pay | Admitting: Gastroenterology

## 2023-02-21 DIAGNOSIS — K9423 Gastrostomy malfunction: Secondary | ICD-10-CM

## 2023-02-24 ENCOUNTER — Telehealth: Payer: Self-pay | Admitting: Family Medicine

## 2023-02-24 ENCOUNTER — Encounter: Payer: Self-pay | Admitting: Family Medicine

## 2023-02-24 ENCOUNTER — Encounter: Payer: Medicare Other | Admitting: Family Medicine

## 2023-02-24 ENCOUNTER — Ambulatory Visit (INDEPENDENT_AMBULATORY_CARE_PROVIDER_SITE_OTHER): Payer: Medicare Other | Admitting: Family Medicine

## 2023-02-24 VITALS — BP 124/60 | HR 82 | Temp 98.1°F | Ht 63.0 in | Wt 138.0 lb

## 2023-02-24 DIAGNOSIS — R21 Rash and other nonspecific skin eruption: Secondary | ICD-10-CM

## 2023-02-24 MED ORDER — CEPHALEXIN 500 MG PO CAPS: 500.0000 mg | ORAL_CAPSULE | Freq: Three times a day (TID) | ORAL | 0 refills | Status: AC

## 2023-02-24 NOTE — Telephone Encounter (Signed)
I have spoke to the husband and we made her a my chart visit for 11 am with [place]

## 2023-02-24 NOTE — Telephone Encounter (Signed)
I am not sure if they can do a video visit . Husband states she has a rash and if she rides she gets sick . Please advise if we can send in a Antibiotic . ? I explained to him she may  need an apt

## 2023-02-24 NOTE — Telephone Encounter (Signed)
That will need to be evaluated prior to prescribing antibiotics.  If they are able to do a video visit that would be sufficient- we just need to be able to put eyes on it.

## 2023-02-24 NOTE — Patient Instructions (Signed)
-  Prescribed Keflex 500mg , 1 tablet every 8 hours for 7 days. Medication can be crushed and administered through feeding tube.  -Continue to keep area clean, dry, and use "Fanny Cream". Pat dry area. Avoid scrubbing area.  -Follow up if not improved or becomes worse.

## 2023-02-24 NOTE — Telephone Encounter (Signed)
Caller name: Layia Gama   On DPR?: Yes  Call back number: [phone] (home)  Provider they see: [name], MD  Reason for call:Pt went into [facility] for feeding tube broke but she contacted a rash, Spouse states it's cellulitis. He would like an ABX called in because riding in the car  makes her have nausea.  Pharmacy: [facility] [place] [address]

## 2023-02-24 NOTE — Progress Notes (Signed)
Acute Office Visit   Subjective:  Patient ID: Tasha Moss, female    DOB: February 25, 1936, 87 y.o.   MRN: [id number]  Chief Complaint  Patient presents with   Rash    Pt reports rash on both sides of buttocks. C/O redness,heat,burning. OTC benadryl    Rash  Patient is a 87 year old caucasian female who presents redness, burning, and warmth to her buttocks. She reports this started on Friday during the day, she noticed her bottom was red. She took Benadryl on Friday, Saturday, and Sunday. Also, been using "Fanny Cream" that she receives from her pharmacy to the area. Patient reports she will periodically develop a wound to her left inner buttock cheek. Patients spouse reports the area looks the best it has in a while.   Attempted earlier today to have a video visit to eliminate patient from riding in the car because it makes her nausea. However, patients spouse's phone didn't have MyChart app on the phone to allow a video appointment.    Review of Systems  Skin:  Positive for rash.   See HPI above      Objective:    BP 124/60   Pulse 82   Temp 98.1 F (36.7 C)   Ht 5\' 3"  (1.6 m)   Wt 138 lb (62.6 kg)   SpO2 96%   BMI 24.45 kg/m    Physical Exam Vitals reviewed.  Constitutional:      General: She is not in acute distress.    Appearance: Normal appearance. She is not ill-appearing, toxic-appearing or diaphoretic.  Eyes:     General:        Right eye: No discharge.        Left eye: No discharge.     Conjunctiva/sclera: Conjunctivae normal.  Cardiovascular:     Rate and Rhythm: Normal rate.  Pulmonary:     Effort: Pulmonary effort is normal. No respiratory distress.  Musculoskeletal:        General: Normal range of motion.  Skin:    General: Skin is warm and dry.     Findings: Erythema (Buttocks-scaly skin noted to upper left buttock crack with no open wound. Patient has "Fanny Cream" applied to her buttock. Picture included.) present.  Neurological:     General:  No focal deficit present.     Mental Status: She is alert and oriented to person, place, and time. Mental status is at baseline.     Gait: Gait abnormal ([place]).  Psychiatric:        Mood and Affect: Mood normal.        Behavior: Behavior normal.        Thought Content: Thought content normal.        Judgment: Judgment normal.        Assessment & Plan:  Rash and nonspecific skin eruption -     Cephalexin; Take 1 capsule (500 mg total) by mouth 3 (three) times daily for 7 days.  Dispense: 21 capsule; Refill: 0  1.Prescribed Cephalexin 500mg , 1 tablet every 8 hours for 7 days. Medication can be crushed and administered through feeding tube.  2.Continue to keep area clean, dry, and use "Fanny Cream". Pat dry area. Avoid scrubbing area.  3.Follow up if not improved or becomes worse.  4.Patient reports she is using cushioned seats. Recommend during visit, get up every hour and take a few steps to take some of the pressure off of her buttocks.  No follow-ups on file.  Di Kindle  Maricela Bo, NP

## 2023-02-25 NOTE — Progress Notes (Signed)
This encounter was created in error - please disregard.

## 2023-02-26 DIAGNOSIS — K222 Esophageal obstruction: Secondary | ICD-10-CM | POA: Diagnosis not present

## 2023-02-26 DIAGNOSIS — R1314 Dysphagia, pharyngoesophageal phase: Secondary | ICD-10-CM | POA: Diagnosis not present

## 2023-02-26 DIAGNOSIS — R6339 Other feeding difficulties: Secondary | ICD-10-CM | POA: Diagnosis not present

## 2023-02-26 DIAGNOSIS — E440 Moderate protein-calorie malnutrition: Secondary | ICD-10-CM | POA: Diagnosis not present

## 2023-03-04 ENCOUNTER — Telehealth: Payer: Self-pay | Admitting: Internal Medicine

## 2023-03-04 NOTE — Telephone Encounter (Signed)
GI ON CALL NOTE  Patient's husband called regarding leaky PEG tube. No other complaints. Chart reviewed. PEG placed by Surgery. Seen in office for the same. Referred back to surgery. Had tube replaced, he said. Told to hold feeds overnight and resume in am (less leaking when up and about, they say).  Nurse,  the patient's husband is going to call in am. Please refer back to surgery for reassessment. Nothing further to offer from GI perspective (except lots of gauze packing) as this tube needed to be placed surgically due to anatomy.  Tasha Moss. Tasha Moss., M.D. [facility] Division of Gastroenterology

## 2023-03-05 ENCOUNTER — Other Ambulatory Visit: Payer: Self-pay | Admitting: Family Medicine

## 2023-03-05 ENCOUNTER — Other Ambulatory Visit: Payer: Self-pay

## 2023-03-05 ENCOUNTER — Telehealth: Payer: Self-pay

## 2023-03-05 DIAGNOSIS — R519 Headache, unspecified: Secondary | ICD-10-CM

## 2023-03-05 MED ORDER — ESOMEPRAZOLE MAGNESIUM 40 MG PO CPDR: DELAYED_RELEASE_CAPSULE | ORAL | 3 refills | Status: DC

## 2023-03-05 MED ORDER — FLUOXETINE HCL 20 MG PO CAPS: 20.0000 mg | ORAL_CAPSULE | Freq: Every day | ORAL | 0 refills | Status: DC

## 2023-03-05 NOTE — Telephone Encounter (Signed)
Pharmacy is asking for a refill on pt Meloxicam is this ok to refill for pt ?

## 2023-03-05 NOTE — Telephone Encounter (Signed)
Pts husband called and wanted to know if the low profile tube had come in. Discussed with him that he needed to contact [facility] to discuss tube. He stated he only had our phone number and needed the phone number for [facility]. Pt given the phone number [phone].

## 2023-03-05 NOTE — Telephone Encounter (Signed)
PT husband is calling for an update on leaky PEG tube. Please advise.

## 2023-03-06 ENCOUNTER — Other Ambulatory Visit (HOSPITAL_COMMUNITY): Payer: Self-pay | Admitting: Interventional Radiology

## 2023-03-06 DIAGNOSIS — R633 Feeding difficulties, unspecified: Secondary | ICD-10-CM

## 2023-03-10 MED ORDER — MELOXICAM 7.5 MG PO TABS: ORAL_TABLET | ORAL | 6 refills | Status: DC

## 2023-03-10 NOTE — Telephone Encounter (Signed)
Medication filled as requested. 

## 2023-03-13 ENCOUNTER — Ambulatory Visit (HOSPITAL_COMMUNITY)
Admission: RE | Admit: 2023-03-13 | Discharge: 2023-03-13 | Disposition: A | Discharge status: Home / Self Care | Payer: Medicare Other | Source: Ambulatory Visit | Attending: Interventional Radiology | Admitting: Interventional Radiology

## 2023-03-13 ENCOUNTER — Other Ambulatory Visit (HOSPITAL_COMMUNITY): Payer: Self-pay | Admitting: Interventional Radiology

## 2023-03-13 DIAGNOSIS — Z431 Encounter for attention to gastrostomy: Secondary | ICD-10-CM | POA: Diagnosis not present

## 2023-03-13 DIAGNOSIS — R633 Feeding difficulties, unspecified: Secondary | ICD-10-CM

## 2023-03-13 HISTORY — PX: IR REPLACE G-TUBE SIMPLE WO FLUORO: IMG2323

## 2023-03-17 ENCOUNTER — Other Ambulatory Visit (HOSPITAL_COMMUNITY): Payer: Self-pay | Admitting: Interventional Radiology

## 2023-03-17 DIAGNOSIS — R633 Feeding difficulties, unspecified: Secondary | ICD-10-CM

## 2023-04-01 DIAGNOSIS — K222 Esophageal obstruction: Secondary | ICD-10-CM | POA: Diagnosis not present

## 2023-04-01 DIAGNOSIS — R1314 Dysphagia, pharyngoesophageal phase: Secondary | ICD-10-CM | POA: Diagnosis not present

## 2023-04-01 DIAGNOSIS — R6339 Other feeding difficulties: Secondary | ICD-10-CM | POA: Diagnosis not present

## 2023-04-01 DIAGNOSIS — E440 Moderate protein-calorie malnutrition: Secondary | ICD-10-CM | POA: Diagnosis not present

## 2023-04-01 DIAGNOSIS — R627 Adult failure to thrive: Secondary | ICD-10-CM | POA: Diagnosis not present

## 2023-04-03 ENCOUNTER — Other Ambulatory Visit: Payer: Self-pay | Admitting: Family Medicine

## 2023-05-01 ENCOUNTER — Other Ambulatory Visit: Payer: Self-pay | Admitting: Family Medicine

## 2023-05-02 DIAGNOSIS — R6339 Other feeding difficulties: Secondary | ICD-10-CM | POA: Diagnosis not present

## 2023-05-02 DIAGNOSIS — E440 Moderate protein-calorie malnutrition: Secondary | ICD-10-CM | POA: Diagnosis not present

## 2023-05-02 DIAGNOSIS — R1314 Dysphagia, pharyngoesophageal phase: Secondary | ICD-10-CM | POA: Diagnosis not present

## 2023-05-02 DIAGNOSIS — K222 Esophageal obstruction: Secondary | ICD-10-CM | POA: Diagnosis not present

## 2023-05-02 DIAGNOSIS — R627 Adult failure to thrive: Secondary | ICD-10-CM | POA: Diagnosis not present

## 2023-05-14 ENCOUNTER — Ambulatory Visit: Payer: Medicare Other | Admitting: Podiatry

## 2023-05-14 DIAGNOSIS — B351 Tinea unguium: Secondary | ICD-10-CM

## 2023-05-14 DIAGNOSIS — M79675 Pain in left toe(s): Secondary | ICD-10-CM

## 2023-05-14 DIAGNOSIS — M79674 Pain in right toe(s): Secondary | ICD-10-CM

## 2023-05-14 NOTE — Progress Notes (Signed)
   SUBJECTIVE Patient presents to office today complaining of elongated, thickened nails that cause pain while ambulating in shoes.  Patient is unable to trim their own nails. Patient is here for further evaluation and treatment.  Past Medical History:  Diagnosis Date   Allergy    seasonal & dust   Anxiety    Arthritis    DJD   Cellulitis    Family history of adverse reaction to anesthesia    n/v    GERD (gastroesophageal reflux disease)    History of vertigo    Hyperlipidemia    Hypertension    Hypothyroidism    PONV (postoperative nausea and vomiting)    Thyroid disease    hypothyroidism post Hashimoto's & partial thyroidectomy    OBJECTIVE General Patient is awake, alert, and oriented x 3 and in no acute distress. Derm Skin is dry and supple bilateral. Negative open lesions or macerations. Remaining integument unremarkable. Nails are tender, long, thickened and dystrophic with subungual debris, consistent with onychomycosis, 1-5 bilateral. No signs of infection noted. Vasc  DP and PT pedal pulses palpable bilaterally. Temperature gradient within normal limits.  Neuro Epicritic and protective threshold sensation grossly intact bilaterally.  Musculoskeletal Exam No symptomatic pedal deformities noted bilateral. Muscular strength within normal limits.  ASSESSMENT 1.  Pain due to onychomycosis of toenails both  PLAN OF CARE 1. Patient evaluated today.  2. Instructed to maintain good pedal hygiene and foot care.  3. Mechanical debridement of nails 1-5 bilaterally performed using a nail nipper. Filed with dremel without incident.  4. Return to clinic in 3 mos.    Riyaan Heroux M. Kymia Simi, DPM [facility]  Dr. Ronasia Isola M. Lindie Roberson, DPM    [address]                                     [place],  [address]                Office [phone]  Fax [phone]    

## 2023-05-21 DIAGNOSIS — H6123 Impacted cerumen, bilateral: Secondary | ICD-10-CM | POA: Diagnosis not present

## 2023-05-31 DIAGNOSIS — R6339 Other feeding difficulties: Secondary | ICD-10-CM | POA: Diagnosis not present

## 2023-05-31 DIAGNOSIS — R1314 Dysphagia, pharyngoesophageal phase: Secondary | ICD-10-CM | POA: Diagnosis not present

## 2023-05-31 DIAGNOSIS — E440 Moderate protein-calorie malnutrition: Secondary | ICD-10-CM | POA: Diagnosis not present

## 2023-05-31 DIAGNOSIS — K222 Esophageal obstruction: Secondary | ICD-10-CM | POA: Diagnosis not present

## 2023-06-03 ENCOUNTER — Other Ambulatory Visit: Payer: Self-pay | Admitting: Family Medicine

## 2023-06-18 ENCOUNTER — Telehealth: Payer: Self-pay

## 2023-06-18 NOTE — Telephone Encounter (Signed)
Patient is requesting fanny cream, the requests were not coming through in fax correctly so opened a phone note.  This is normally a compounded medication and is not showing in the medication history list please advise

## 2023-06-19 NOTE — Telephone Encounter (Signed)
Patients husband has called to inform us that the they need the medication sent to [facility]  Address: [address] [place], [place] [address] Phone Number: [phone]  The copay at [facility] is higher than at [facility]

## 2023-06-19 NOTE — Telephone Encounter (Signed)
Ok to provide verbal order to pharmacy for [employer].  They have made this for pt in the past and typically compound and dispense w/ our verbal order

## 2023-06-19 NOTE — Telephone Encounter (Signed)
Spoke to [place] at [facility] and they are filling the fanny cream

## 2023-06-19 NOTE — Telephone Encounter (Signed)
It has been sent to [facility]

## 2023-06-27 ENCOUNTER — Other Ambulatory Visit: Payer: Self-pay | Admitting: Family Medicine

## 2023-06-30 DIAGNOSIS — E440 Moderate protein-calorie malnutrition: Secondary | ICD-10-CM | POA: Diagnosis not present

## 2023-06-30 DIAGNOSIS — R627 Adult failure to thrive: Secondary | ICD-10-CM | POA: Diagnosis not present

## 2023-06-30 DIAGNOSIS — R6339 Other feeding difficulties: Secondary | ICD-10-CM | POA: Diagnosis not present

## 2023-07-04 ENCOUNTER — Ambulatory Visit (HOSPITAL_COMMUNITY)
Admission: RE | Admit: 2023-07-04 | Discharge: 2023-07-04 | Disposition: A | Discharge status: Home / Self Care | Payer: Medicare Other | Source: Ambulatory Visit | Attending: Family Medicine | Admitting: Family Medicine

## 2023-07-04 ENCOUNTER — Encounter: Payer: Self-pay | Admitting: Family Medicine

## 2023-07-04 ENCOUNTER — Ambulatory Visit (INDEPENDENT_AMBULATORY_CARE_PROVIDER_SITE_OTHER): Payer: Medicare Other | Admitting: Family Medicine

## 2023-07-04 VITALS — BP 116/60 | HR 78 | Temp 97.8°F | Wt 145.6 lb

## 2023-07-04 DIAGNOSIS — R635 Abnormal weight gain: Secondary | ICD-10-CM

## 2023-07-04 DIAGNOSIS — R062 Wheezing: Secondary | ICD-10-CM | POA: Insufficient documentation

## 2023-07-04 DIAGNOSIS — R0602 Shortness of breath: Secondary | ICD-10-CM | POA: Diagnosis not present

## 2023-07-04 DIAGNOSIS — B369 Superficial mycosis, unspecified: Secondary | ICD-10-CM

## 2023-07-04 DIAGNOSIS — E782 Mixed hyperlipidemia: Secondary | ICD-10-CM | POA: Diagnosis not present

## 2023-07-04 DIAGNOSIS — R9389 Abnormal findings on diagnostic imaging of other specified body structures: Secondary | ICD-10-CM | POA: Diagnosis not present

## 2023-07-04 DIAGNOSIS — R918 Other nonspecific abnormal finding of lung field: Secondary | ICD-10-CM | POA: Diagnosis not present

## 2023-07-04 LAB — CBC WITH DIFFERENTIAL/PLATELET
Absolute Monocytes: 837 cells/uL (ref 200–950)
Basophils Absolute: 32 cells/uL (ref 0–200)
Basophils Relative: 0.4 %
Eosinophils Absolute: 253 cells/uL (ref 15–500)
Eosinophils Relative: 3.2 %
HCT: 42.6 % (ref 35.0–45.0)
Hemoglobin: 14.3 g/dL (ref 11.7–15.5)
Lymphs Abs: 837 cells/uL — ABNORMAL LOW (ref 850–3900)
MCH: 31.6 pg (ref 27.0–33.0)
MCHC: 33.6 g/dL (ref 32.0–36.0)
MCV: 94.2 fL (ref 80.0–100.0)
MPV: 12.3 fL (ref 7.5–12.5)
Monocytes Relative: 10.6 %
Neutro Abs: 5941 cells/uL (ref 1500–7800)
Neutrophils Relative %: 75.2 %
Platelets: 194 10*3/uL (ref 140–400)
RBC: 4.52 10*6/uL (ref 3.80–5.10)
RDW: 13.1 % (ref 11.0–15.0)
Total Lymphocyte: 10.6 %
WBC: 7.9 10*3/uL (ref 3.8–10.8)

## 2023-07-04 MED ORDER — NYSTATIN 100000 UNIT/GM EX POWD: 1.0000 | Freq: Three times a day (TID) | CUTANEOUS | 3 refills | Status: DC

## 2023-07-04 MED ORDER — ALBUTEROL SULFATE HFA 108 (90 BASE) MCG/ACT IN AERS: 2.0000 | INHALATION_SPRAY | Freq: Four times a day (QID) | RESPIRATORY_TRACT | 0 refills | Status: DC | PRN

## 2023-07-04 NOTE — Progress Notes (Signed)
   Subjective:    Patient ID: Tasha Moss, female    DOB: 12-Aug-1936, 87 y.o.   MRN: [id number]  HPI SOB- daughter states this has been going on for a few months but seems to be worsening.  Daughter reports audible wheezing- 'you can hear it from the other room'.  Any exertion causes SOB- from chair to bed.  + cough for a few months but seems to be worsening.  Cough is mostly dry.  No CP.  Some SOB at rest.  Gtube redness/irritation- husband was instructed to apply vaseline to red areas.  Daughter reports area is now raw and macerated.  Weight gain- pt is up from 122 --> 145 in under 1 yr.  Asking if they should decrease from 5 cartons of Jevity to 4.   Review of Systems For ROS see HPI     Objective:   Physical Exam Vitals reviewed.  Constitutional:      General: She is not in acute distress.    Appearance: She is ill-appearing (frail, elderly woman).  HENT:     Head: Normocephalic and atraumatic.  Cardiovascular:     Rate and Rhythm: Normal rate and regular rhythm.  Pulmonary:     Effort: No tachypnea, accessory muscle usage or respiratory distress.     Comments: Mouth breathing Coarse breath sounds throughout w/o wheezing at this time No cough during visit Abdominal:     Palpations: Abdomen is soft.     Tenderness: There is no guarding or rebound.  Musculoskeletal:     Cervical back: Neck supple.  Lymphadenopathy:     Cervical: No cervical adenopathy.  Skin:    General: Skin is warm and dry.     Findings: Rash (beefy red rash around Gtube site on L side of abd) present.  Neurological:     Mental Status: She is alert.  Psychiatric:        Mood and Affect: Mood normal.           Assessment & Plan:   Fungal dermatitis- new.  Located around G tube site.  Will start Nystatin powder to avoid further macerating the skin.  Encouraged them to keep area clean and dry  SOB/Wheezing- deteriorating.  Reviewed ECHO done Feb 2023 and no evidence of CHF.  Not in afib.   Discussed that there is likely a component of deconditioning given her sedentary lifestyle.  For the coarse BS, will get CXR to assess.  For the wheezing, will start Albuterol PRN.  If this is helpful, will add ICS.  If sxs change or worsen, will refer to pulmonary.  Pt expressed understanding and is in agreement w/ plan.   Weight Gain- pt has gained 20 lbs in the last year since getting her feeding tube.  At this time, can decrease the Jevity from 5 cartons down to 4.  Will check labs and make additional adjustments based on results.

## 2023-07-04 NOTE — Patient Instructions (Addendum)
Follow up via MyChart next week and let me know how the inhaler is working (or not working) [employer] notify you of your lab results and make any changes if needed Go to [facility] and get your chest xray done Start the Nystatin powder on the Gtube area up to 3x/day Use the albuterol inhaler- 2 puffs every 4 hrs as needed for cough or wheezing Decrease the Jevity from 5 cartons to 4 cartons Call with any questions or concerns Hang in there!

## 2023-07-07 ENCOUNTER — Telehealth: Payer: Self-pay | Admitting: Family Medicine

## 2023-07-07 ENCOUNTER — Telehealth: Payer: Self-pay

## 2023-07-07 NOTE — Telephone Encounter (Signed)
Informed pt of lab results  

## 2023-07-07 NOTE — Telephone Encounter (Signed)
Caller name: MARGUERETTE GARSKI  On DPR?: Yes  Call back number: [phone] (home)  Provider they see: Sheliah Hatch, MD  Reason for call:  Spouse called wanting test results

## 2023-07-07 NOTE — Telephone Encounter (Signed)
-----   Message from Neena Rhymes sent at 07/07/2023  7:31 AM EDT ----- Labs are stable and look pretty good!  No evidence of infection- great news!  No med changes at this time

## 2023-07-07 NOTE — Telephone Encounter (Signed)
Spoke with Mr.Fakhouri and he is aware of lab results for Mrs.Ladona Ridgel.

## 2023-07-10 ENCOUNTER — Telehealth: Payer: Self-pay

## 2023-07-10 ENCOUNTER — Other Ambulatory Visit: Payer: Self-pay

## 2023-07-10 DIAGNOSIS — R0602 Shortness of breath: Secondary | ICD-10-CM

## 2023-07-10 MED ORDER — DOXYCYCLINE HYCLATE 100 MG PO TABS
100.0000 mg | ORAL_TABLET | Freq: Two times a day (BID) | ORAL | 0 refills | Status: DC
Start: 2023-07-10 — End: 2023-08-21

## 2023-07-10 NOTE — Telephone Encounter (Signed)
-----   Message from Tasha Moss sent at 07/10/2023  4:18 PM EDT ----- Your chest xray shows that there is a vague area in the R mid lung that may be scar tissue or may be a developing infection.  Given that you have had shortness of breath and wheezing, we will start Doxycycline 100mg  twice daily x7 days (#14, no refills)

## 2023-07-10 NOTE — Telephone Encounter (Signed)
Informed pt husband of results . Doxycycline 100 mg # 14 has been sent to pharmacy

## 2023-07-15 ENCOUNTER — Telehealth: Payer: Self-pay | Admitting: Family Medicine

## 2023-07-15 NOTE — Telephone Encounter (Signed)
Received forms from [employer] & placed in provider bin

## 2023-07-15 NOTE — Telephone Encounter (Signed)
Forms completed and faxed placed in scan

## 2023-07-22 ENCOUNTER — Telehealth: Payer: Self-pay | Admitting: Family Medicine

## 2023-07-22 NOTE — Telephone Encounter (Signed)
Placed in sign folder. 

## 2023-07-22 NOTE — Telephone Encounter (Signed)
Received forms from [facility] Daughter will pick up when ready Printed & placed in provider bin

## 2023-07-24 ENCOUNTER — Telehealth: Payer: Self-pay

## 2023-07-24 NOTE — Telephone Encounter (Signed)
Picked up.

## 2023-07-24 NOTE — Telephone Encounter (Signed)
Daughter Tasha Moss sent some forms from health and human services for [name] to sign . She states her father needs help taking care of her mother

## 2023-07-24 NOTE — Telephone Encounter (Signed)
I have called the daughter Verlee Rossetti to pick up form . Copy placed in Scan

## 2023-07-24 NOTE — Telephone Encounter (Signed)
Form completed and returned to [name] 

## 2023-07-27 IMAGING — DX DG CHEST 2V
2 series · 2 of 2 positions shown · non-contrast
Comparison: Chest x-ray 01/09/2022.

CLINICAL DATA: Evaluate for aspiration.

EXAM:
CHEST - 2 VIEW

[chest pa]
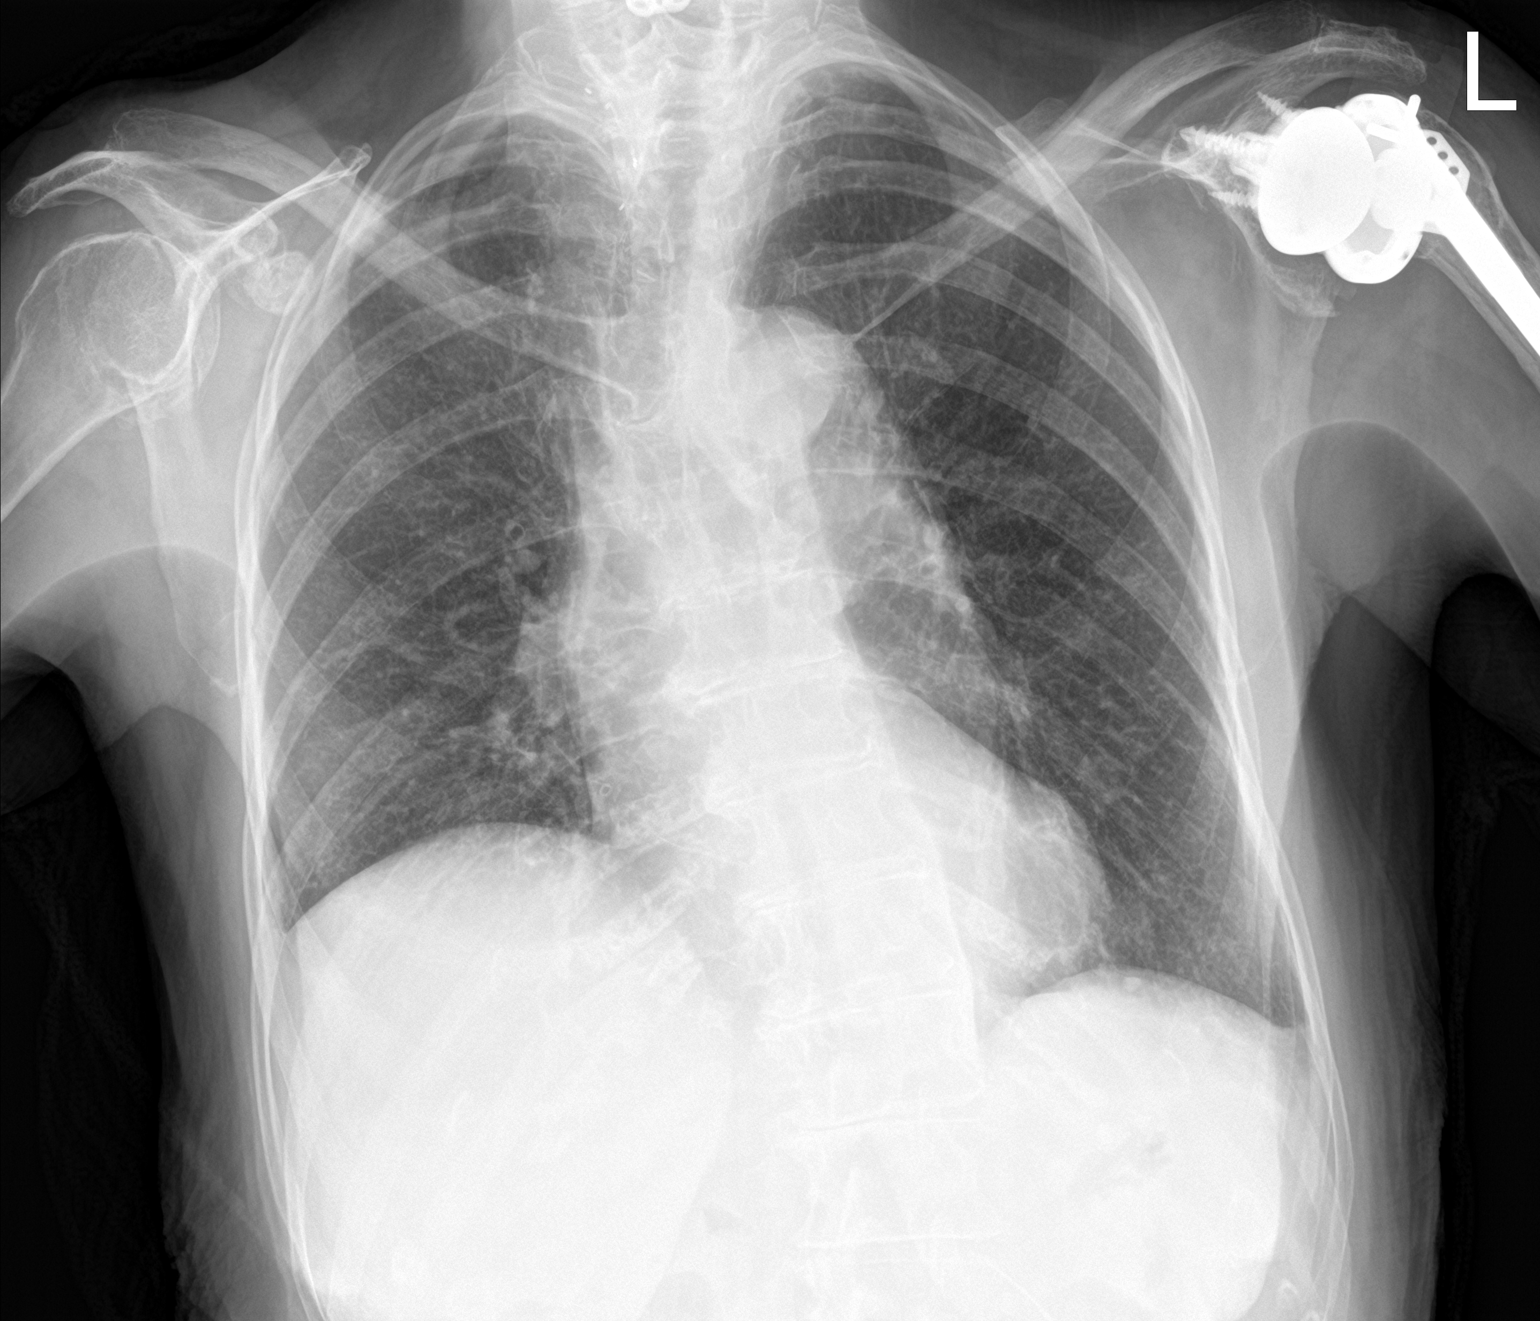

[chest lat]
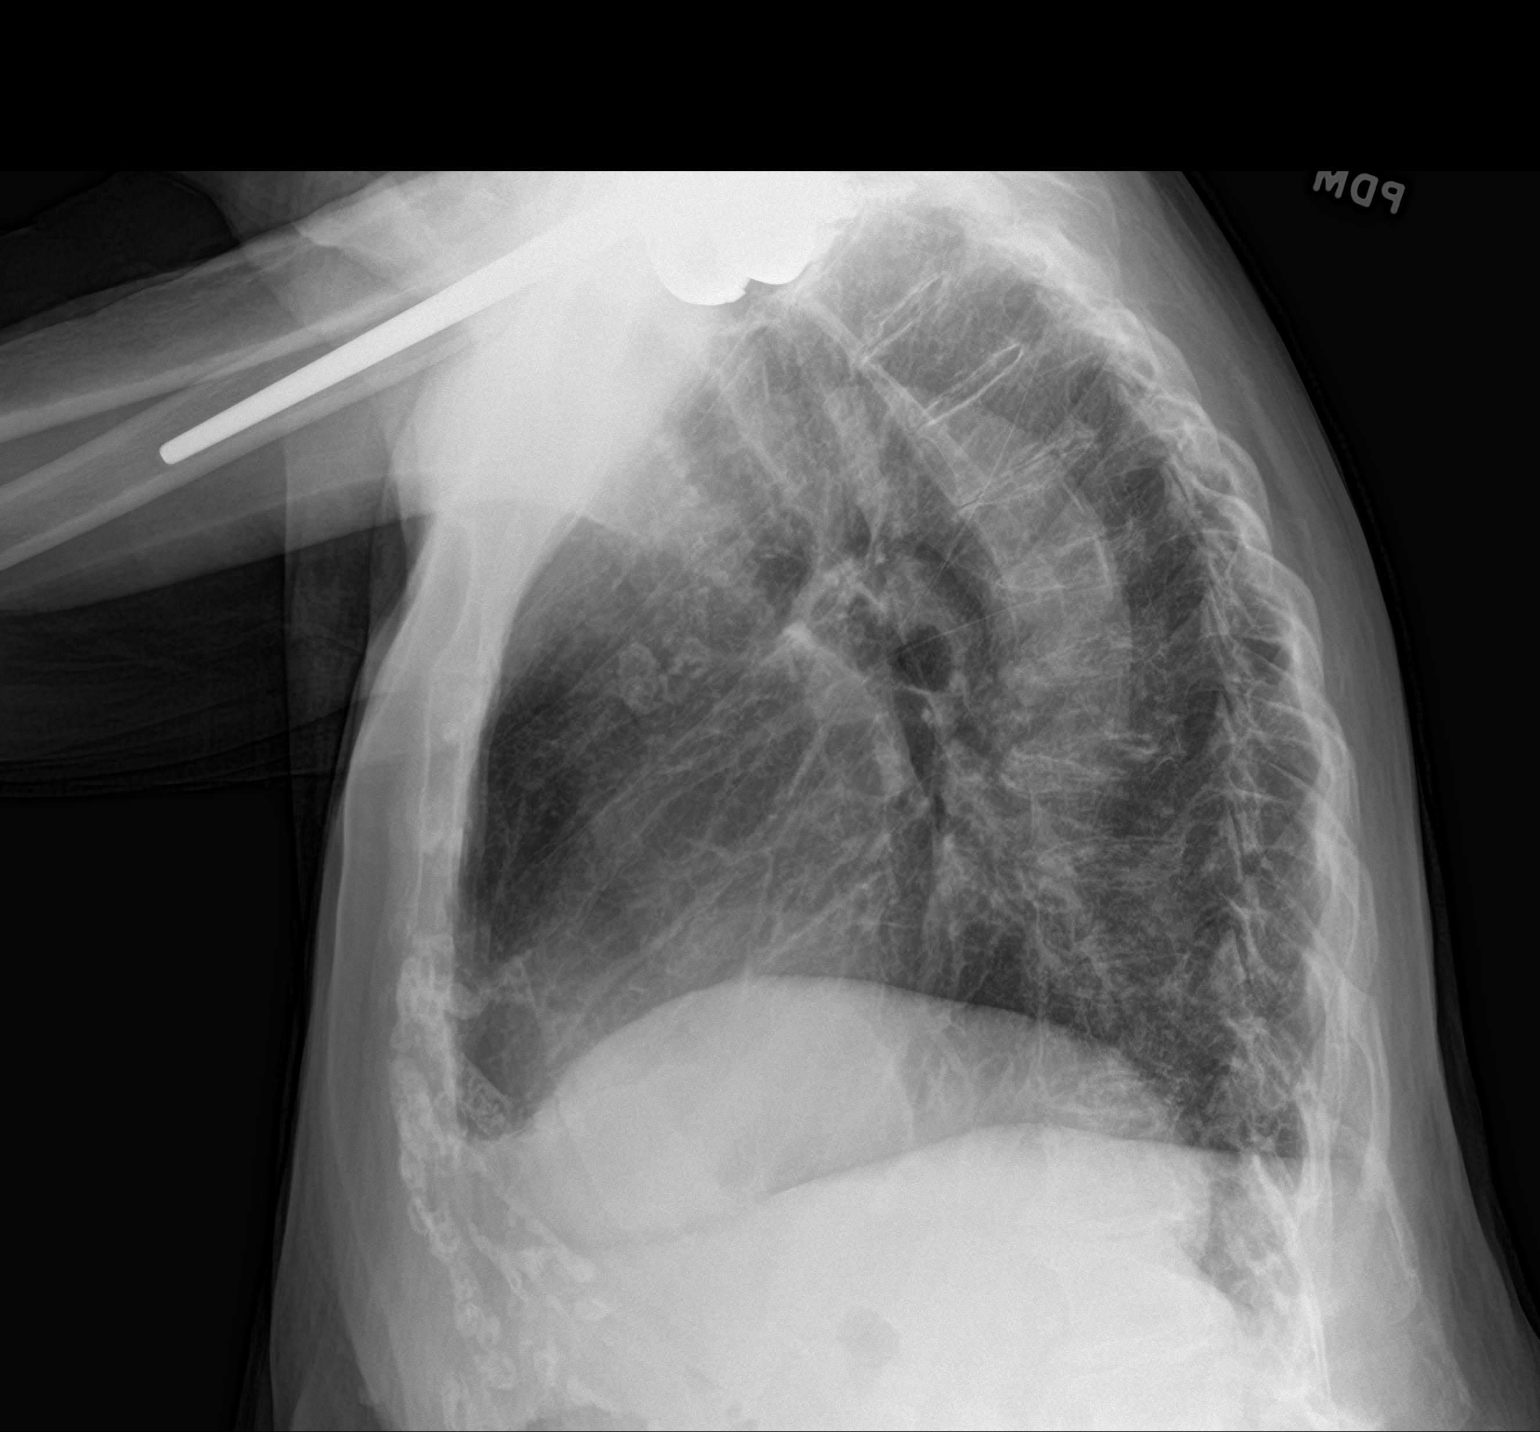

[2 of 2 positions shown; findings below may reference images not displayed]

FINDINGS: The aorta is tortuous. Cardiac silhouette is within normal limits.
No evidence for focal lung infiltrate. There is minimal blunting of
the costophrenic angles. No acute fractures are seen. Left shoulder
arthroplasty is present.
IMPRESSION: 1. Likely small bilateral pleural effusions.
2. No focal lung infiltrate.

## 2023-07-27 IMAGING — DX DG ABDOMEN 1V
1 series · 1 of 1 positions shown · non-contrast
Comparison: None.

CLINICAL DATA: Feeding tube placement.

EXAM:
ABDOMEN - 1 VIEW

[abdomen kub]
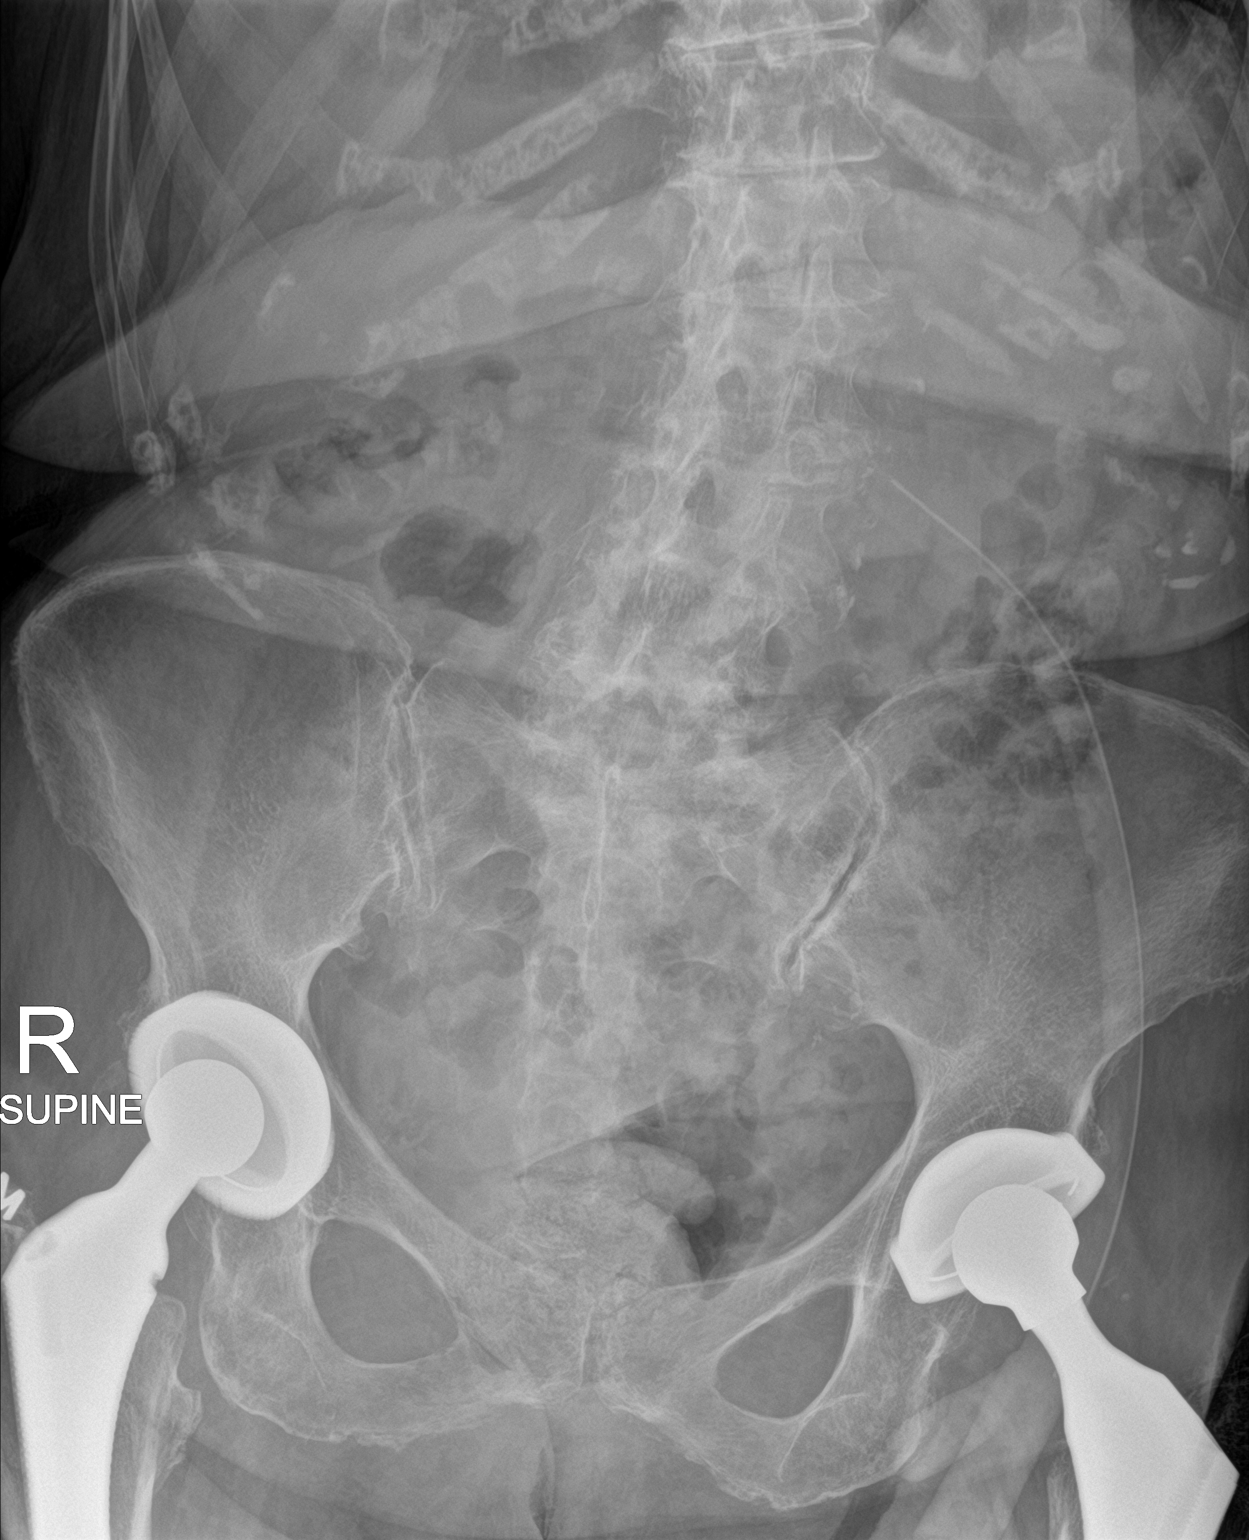

[1 of 1 positions shown; findings below may reference images not displayed]

FINDINGS: Bowel-gas pattern is nonobstructive. Catheter is present with tip
overlying the left mid abdomen. Bilateral hip arthroplasties are
present. There are degenerative changes of the spine.
IMPRESSION: 1. A catheter is present with distal tip overlying the mid abdomen.
Exact location is unclear.
2. Nonobstructive bowel gas pattern.

## 2023-07-29 NOTE — Telephone Encounter (Signed)
Daughter, Cala Bradford, called to check on the progress of the paperwork as it is time sensitive.

## 2023-07-29 NOTE — Telephone Encounter (Signed)
Forms faxed and I have notified the daughter . I have them in my blue folder beside my laptop

## 2023-07-29 NOTE — Telephone Encounter (Signed)
 Completed and returned to [facility]

## 2023-07-29 NOTE — Telephone Encounter (Signed)
[name] has not completed forms at this time

## 2023-07-30 DIAGNOSIS — E440 Moderate protein-calorie malnutrition: Secondary | ICD-10-CM | POA: Diagnosis not present

## 2023-07-30 DIAGNOSIS — R627 Adult failure to thrive: Secondary | ICD-10-CM | POA: Diagnosis not present

## 2023-07-30 DIAGNOSIS — R6339 Other feeding difficulties: Secondary | ICD-10-CM | POA: Diagnosis not present

## 2023-08-01 ENCOUNTER — Other Ambulatory Visit: Payer: Self-pay | Admitting: Family Medicine

## 2023-08-01 ENCOUNTER — Telehealth: Payer: Self-pay

## 2023-08-01 NOTE — Telephone Encounter (Signed)
Had to fax added dx code R63.5 to them [phone] and the form had two different patients and can not scan

## 2023-08-13 ENCOUNTER — Telehealth: Payer: Self-pay | Admitting: Family Medicine

## 2023-08-13 NOTE — Telephone Encounter (Signed)
Encourage patient to contact the pharmacy for refills or they can request refills through [facility]  (Please schedule appointment if patient has not been seen in over a year)    WHAT PHARMACY WOULD THEY LIKE THIS SENT TO: [facility] - [place], [place] - [address]   MEDICATION NAME & DOSE: Fanny Cream  NOTES/COMMENTS FROM PATIENT: Pt still has sores so still in need of this cream     Front office please notify patient: It takes 48-72 hours to process rx refill requests Ask patient to call pharmacy to ensure rx is ready before heading there.

## 2023-08-14 NOTE — Telephone Encounter (Signed)
Gave verbal order to Apolinar Junes the pharmacist at [facility] for pt [employer]

## 2023-08-19 ENCOUNTER — Telehealth: Payer: Self-pay | Admitting: Family Medicine

## 2023-08-19 NOTE — Telephone Encounter (Signed)
Form completed , faxed and place in scan

## 2023-08-19 NOTE — Telephone Encounter (Signed)
Pt's daughter is calling stating pt is wanting to sleep constant , and has a wound on her back . Please advise if you would like pt to go to ER or make an apt here for tomorrow ?

## 2023-08-19 NOTE — Telephone Encounter (Signed)
Caller name: [name]   On DPR?: Yes  Call back number: [phone]  Provider they see: Sheliah Hatch, MD  Reason for call:  Lung infection lethargic always wants to sleep. Wants her opinion next steps should be?  Wound on her back-[facility] won't refill cream why- advise

## 2023-08-19 NOTE — Telephone Encounter (Signed)
 Received forms from [employer] & placed in provider bin

## 2023-08-20 ENCOUNTER — Telehealth: Payer: Self-pay | Admitting: Family Medicine

## 2023-08-20 ENCOUNTER — Ambulatory Visit: Payer: Medicare Other | Admitting: Podiatry

## 2023-08-20 NOTE — Telephone Encounter (Signed)
Daughter Selena Batten returned my call was unable to connect with her at that time attempted a call back and again got VM

## 2023-08-20 NOTE — Telephone Encounter (Signed)
I'm not sure what they're talking about regarding a 'lung infection', but if there's concern for worsening illness/infection or increased lethargy, an ER evaluation is appropriate.  And next time, any time the ER is mentioned, please make sure this is routed to a provider in office for review.  As for the fanny cream- this was reordered on 9/5.  I'm not sure why the pharmacy won't fill.  But as above, this is not the main item of concern.

## 2023-08-20 NOTE — Telephone Encounter (Signed)
Caller name: [name]   On DPR?: Yes  Call back number: [phone]  Provider they see: Sheliah Hatch, MD  Reason for call:   Selena Batten would actually perfer a call from [facility]. This is personal and they respect her opinion.

## 2023-08-20 NOTE — Telephone Encounter (Signed)
Called had to leave a message to call back

## 2023-08-20 NOTE — Telephone Encounter (Signed)
Daughter  returning [facility] call

## 2023-08-21 ENCOUNTER — Ambulatory Visit: Payer: Medicare Other | Admitting: Family Medicine

## 2023-08-21 ENCOUNTER — Emergency Department (HOSPITAL_COMMUNITY): Payer: Medicare Other

## 2023-08-21 ENCOUNTER — Other Ambulatory Visit: Payer: Self-pay

## 2023-08-21 ENCOUNTER — Inpatient Hospital Stay (HOSPITAL_COMMUNITY)
Admission: EM | Admit: 2023-08-21 | Discharge: 2023-08-25 | DRG: 189 | Disposition: A | Discharge status: Skilled Nursing Facility | Payer: Medicare Other | Attending: Internal Medicine | Admitting: Internal Medicine

## 2023-08-21 ENCOUNTER — Telehealth: Payer: Self-pay | Admitting: Family Medicine

## 2023-08-21 ENCOUNTER — Encounter (HOSPITAL_COMMUNITY): Payer: Self-pay

## 2023-08-21 DIAGNOSIS — E039 Hypothyroidism, unspecified: Secondary | ICD-10-CM | POA: Diagnosis not present

## 2023-08-21 DIAGNOSIS — Z961 Presence of intraocular lens: Secondary | ICD-10-CM | POA: Diagnosis not present

## 2023-08-21 DIAGNOSIS — I10 Essential (primary) hypertension: Secondary | ICD-10-CM | POA: Diagnosis not present

## 2023-08-21 DIAGNOSIS — E440 Moderate protein-calorie malnutrition: Secondary | ICD-10-CM | POA: Diagnosis not present

## 2023-08-21 DIAGNOSIS — Z791 Long term (current) use of non-steroidal anti-inflammatories (NSAID): Secondary | ICD-10-CM

## 2023-08-21 DIAGNOSIS — F419 Anxiety disorder, unspecified: Secondary | ICD-10-CM | POA: Diagnosis present

## 2023-08-21 DIAGNOSIS — E785 Hyperlipidemia, unspecified: Secondary | ICD-10-CM | POA: Diagnosis present

## 2023-08-21 DIAGNOSIS — J9601 Acute respiratory failure with hypoxia: Secondary | ICD-10-CM | POA: Diagnosis not present

## 2023-08-21 DIAGNOSIS — Z931 Gastrostomy status: Secondary | ICD-10-CM | POA: Diagnosis not present

## 2023-08-21 DIAGNOSIS — R54 Age-related physical debility: Secondary | ICD-10-CM | POA: Diagnosis not present

## 2023-08-21 DIAGNOSIS — L89152 Pressure ulcer of sacral region, stage 2: Secondary | ICD-10-CM | POA: Diagnosis present

## 2023-08-21 DIAGNOSIS — Z885 Allergy status to narcotic agent status: Secondary | ICD-10-CM

## 2023-08-21 DIAGNOSIS — Z79899 Other long term (current) drug therapy: Secondary | ICD-10-CM

## 2023-08-21 DIAGNOSIS — J189 Pneumonia, unspecified organism: Secondary | ICD-10-CM

## 2023-08-21 DIAGNOSIS — R1310 Dysphagia, unspecified: Secondary | ICD-10-CM | POA: Diagnosis present

## 2023-08-21 DIAGNOSIS — R0609 Other forms of dyspnea: Secondary | ICD-10-CM | POA: Diagnosis not present

## 2023-08-21 DIAGNOSIS — J90 Pleural effusion, not elsewhere classified: Secondary | ICD-10-CM | POA: Diagnosis not present

## 2023-08-21 DIAGNOSIS — Z9842 Cataract extraction status, left eye: Secondary | ICD-10-CM | POA: Diagnosis not present

## 2023-08-21 DIAGNOSIS — Z8249 Family history of ischemic heart disease and other diseases of the circulatory system: Secondary | ICD-10-CM | POA: Diagnosis not present

## 2023-08-21 DIAGNOSIS — Z888 Allergy status to other drugs, medicaments and biological substances status: Secondary | ICD-10-CM

## 2023-08-21 DIAGNOSIS — F418 Other specified anxiety disorders: Secondary | ICD-10-CM | POA: Diagnosis present

## 2023-08-21 DIAGNOSIS — F32A Depression, unspecified: Secondary | ICD-10-CM | POA: Diagnosis present

## 2023-08-21 DIAGNOSIS — K219 Gastro-esophageal reflux disease without esophagitis: Secondary | ICD-10-CM | POA: Diagnosis not present

## 2023-08-21 DIAGNOSIS — Z9841 Cataract extraction status, right eye: Secondary | ICD-10-CM

## 2023-08-21 DIAGNOSIS — R6339 Other feeding difficulties: Secondary | ICD-10-CM | POA: Diagnosis present

## 2023-08-21 DIAGNOSIS — R918 Other nonspecific abnormal finding of lung field: Secondary | ICD-10-CM | POA: Diagnosis not present

## 2023-08-21 DIAGNOSIS — R0989 Other specified symptoms and signs involving the circulatory and respiratory systems: Secondary | ICD-10-CM | POA: Diagnosis not present

## 2023-08-21 DIAGNOSIS — R531 Weakness: Secondary | ICD-10-CM | POA: Diagnosis not present

## 2023-08-21 DIAGNOSIS — R0602 Shortness of breath: Secondary | ICD-10-CM

## 2023-08-21 DIAGNOSIS — L304 Erythema intertrigo: Secondary | ICD-10-CM | POA: Diagnosis not present

## 2023-08-21 DIAGNOSIS — Z6824 Body mass index (BMI) 24.0-24.9, adult: Secondary | ICD-10-CM | POA: Diagnosis not present

## 2023-08-21 DIAGNOSIS — Z1152 Encounter for screening for COVID-19: Secondary | ICD-10-CM | POA: Diagnosis not present

## 2023-08-21 DIAGNOSIS — Z833 Family history of diabetes mellitus: Secondary | ICD-10-CM | POA: Diagnosis not present

## 2023-08-21 DIAGNOSIS — Z96612 Presence of left artificial shoulder joint: Secondary | ICD-10-CM | POA: Diagnosis not present

## 2023-08-21 DIAGNOSIS — Z79890 Hormone replacement therapy (postmenopausal): Secondary | ICD-10-CM

## 2023-08-21 DIAGNOSIS — Z823 Family history of stroke: Secondary | ICD-10-CM

## 2023-08-21 DIAGNOSIS — R059 Cough, unspecified: Secondary | ICD-10-CM | POA: Diagnosis not present

## 2023-08-21 DIAGNOSIS — I5033 Acute on chronic diastolic (congestive) heart failure: Secondary | ICD-10-CM | POA: Diagnosis not present

## 2023-08-21 LAB — CBC WITH DIFFERENTIAL/PLATELET
Abs Immature Granulocytes: 0.03 10*3/uL (ref 0.00–0.07)
Basophils Absolute: 0 10*3/uL (ref 0.0–0.1)
Basophils Relative: 0 %
Eosinophils Absolute: 0.2 10*3/uL (ref 0.0–0.5)
Eosinophils Relative: 2 %
HCT: 40.1 % (ref 36.0–46.0)
Hemoglobin: 12.8 g/dL (ref 12.0–15.0)
Immature Granulocytes: 0 %
Lymphocytes Relative: 7 %
Lymphs Abs: 0.6 10*3/uL — ABNORMAL LOW (ref 0.7–4.0)
MCH: 31.4 pg (ref 26.0–34.0)
MCHC: 31.9 g/dL (ref 30.0–36.0)
MCV: 98.3 fL (ref 80.0–100.0)
Monocytes Absolute: 0.6 10*3/uL (ref 0.1–1.0)
Monocytes Relative: 7 %
Neutro Abs: 6.6 10*3/uL (ref 1.7–7.7)
Neutrophils Relative %: 84 %
Platelets: 164 10*3/uL (ref 150–400)
RBC: 4.08 MIL/uL (ref 3.87–5.11)
RDW: 14.1 % (ref 11.5–15.5)
WBC: 8 10*3/uL (ref 4.0–10.5)
nRBC: 0 % (ref 0.0–0.2)

## 2023-08-21 LAB — PROTIME-INR
INR: 1 (ref 0.8–1.2)
Prothrombin Time: 13.5 s (ref 11.4–15.2)

## 2023-08-21 LAB — COMPREHENSIVE METABOLIC PANEL
ALT: 22 U/L (ref 0–44)
AST: 22 U/L (ref 15–41)
Albumin: 3 g/dL — ABNORMAL LOW (ref 3.5–5.0)
Alkaline Phosphatase: 102 U/L (ref 38–126)
Anion gap: 10 (ref 5–15)
BUN: 28 mg/dL — ABNORMAL HIGH (ref 8–23)
CO2: 29 mmol/L (ref 22–32)
Calcium: 8.3 mg/dL — ABNORMAL LOW (ref 8.9–10.3)
Chloride: 95 mmol/L — ABNORMAL LOW (ref 98–111)
Creatinine, Ser: 0.52 mg/dL (ref 0.44–1.00)
GFR, Estimated: >60 mL/min (ref >60)
Glucose, Bld: 131 mg/dL — ABNORMAL HIGH (ref 70–99)
Potassium: 4.2 mmol/L (ref 3.5–5.1)
Sodium: 134 mmol/L — ABNORMAL LOW (ref 135–145)
Total Bilirubin: 0.6 mg/dL (ref 0.3–1.2)
Total Protein: 7 g/dL (ref 6.5–8.1)

## 2023-08-21 LAB — BRAIN NATRIURETIC PEPTIDE: B Natriuretic Peptide: 63 pg/mL (ref 0.0–100.0)

## 2023-08-21 LAB — SARS CORONAVIRUS 2 BY RT PCR: SARS Coronavirus 2 by RT PCR: NEGATIVE

## 2023-08-21 LAB — MAGNESIUM: Magnesium: 2 mg/dL (ref 1.7–2.4)

## 2023-08-21 LAB — TROPONIN I (HIGH SENSITIVITY)
Troponin I (High Sensitivity): 15 ng/L (ref <18)
Troponin I (High Sensitivity): 17 ng/L (ref <18)

## 2023-08-21 LAB — PROCALCITONIN: Procalcitonin: <0.1 ng/mL

## 2023-08-21 LAB — LACTIC ACID, PLASMA: Lactic Acid, Venous: 1.4 mmol/L (ref 0.5–1.9)

## 2023-08-21 MED ORDER — SODIUM CHLORIDE 0.9 % IV SOLN
500.0000 mg | Freq: Once | INTRAVENOUS | Status: AC
  Administered 2023-08-21: 500 mg via INTRAVENOUS
  Filled 2023-08-21: qty 5

## 2023-08-21 MED ORDER — IOHEXOL 300 MG/ML  SOLN
75.0000 mL | Freq: Once | INTRAMUSCULAR | Status: AC | PRN
  Administered 2023-08-21: 75 mL via INTRAVENOUS

## 2023-08-21 MED ORDER — SODIUM CHLORIDE 0.9 % IV BOLUS
500.0000 mL | Freq: Once | INTRAVENOUS | Status: AC
  Administered 2023-08-21: 500 mL via INTRAVENOUS

## 2023-08-21 MED ORDER — SODIUM CHLORIDE 0.9 % IV SOLN
2.0000 g | Freq: Once | INTRAVENOUS | Status: AC
  Administered 2023-08-21: 2 g via INTRAVENOUS
  Filled 2023-08-21: qty 20

## 2023-08-21 NOTE — ED Notes (Signed)
Pt can only take nectar thick liquids and ice chips PO, all meds through g-tube.

## 2023-08-21 NOTE — ED Triage Notes (Addendum)
Cough, nausea, Weakness and "Breathing bad" for 3 weeks.   Recd meds from PCP for pneumonia 3 weeks ago. Talking is full sentences  HR above 100 RR rate 24  Feeding tube Pressure ulcer to buttocks   Septic work up started in triage

## 2023-08-21 NOTE — Telephone Encounter (Signed)
Pt's daughter, Selena Batten called expressing worry about pt. She is very weak, very lethargic, trembly, coughing/wheezing today. Daughter want's to take her to ER but doesn't want her to have to sit for hours. Last antibiotic started to work and then she started getting bad again since last week. She is looking for advice or guidance on what should be done because she feels like it may be pneumonia.

## 2023-08-21 NOTE — Telephone Encounter (Signed)
Called and spoke to Tasha Moss and she was resistant to recommendation to go to the ER and stated Tasha Moss would have to wait so long and then also be exposed to others in the ER and she also wanted to discuss the palliative care options with [name] today and she is also upset we have not prescribed the fanny cream and states she needs that to be sent in. Daughter Tasha Moss was extremely reluctant but finally agreed to take her to the ED

## 2023-08-21 NOTE — Telephone Encounter (Signed)
I'm glad she is taking her to the ER.  And I'm not sure what is going on with the pharmacy b/c we spoke w/ them on 9/5 regarding a refill.  Per [name]'s phone note on 9/5-   Gave verbal order to [facility] the pharmacist at [facility] for pt [employer]

## 2023-08-21 NOTE — Telephone Encounter (Signed)
Given her concerns, the ER is the right place for her.  Since they are in [place], I would go to [facility] ER.  They should tell the ER that she's been coughing, wheezing, short of breath, and lethargic.  But her gut is right- the ER is where she needs to be

## 2023-08-21 NOTE — ED Notes (Signed)
Pt assisted up to bsc and then assisted back to bed.

## 2023-08-21 NOTE — Telephone Encounter (Signed)
Spoke to Keizer at [facility] the cream will be compounded at [facility] and sent to them. They do not compound medications

## 2023-08-21 NOTE — Telephone Encounter (Signed)
It appears that they have changed their pharmacy.  Please re-send Stephanie Coup Cream to [facility] instead

## 2023-08-21 NOTE — ED Provider Notes (Signed)
[place] EMERGENCY DEPARTMENT AT [facility] Provider Note   CSN: [id number] Arrival date & time: 08/21/23  1326     History  No chief complaint on file.   Tasha Moss is a 87 y.o. female.  Has a history of thyroid disease, reflux, dysphagia and uses a feeding tube.  She started feeling sick a few weeks ago with feeling very rundown and tired.  She went to her primary care doctor and they did an x-ray that showed a possible pneumonia.  She had a course of antibiotics.  She continues to feel terrible, cough along with some soreness in her ribs.  Feeling very weak and shaky.  Sleeping a lot.  She is not sure if she has had a fever.  The history is provided by the patient, the spouse and a relative.  Weakness Severity:  Severe Onset quality:  Gradual Duration:  3 weeks Timing:  Constant Progression:  Worsening Chronicity:  New Relieved by:  Nothing Worsened by:  Activity Ineffective treatments:  Rest Associated symptoms: chest pain, cough, difficulty walking, nausea and shortness of breath   Associated symptoms: no abdominal pain, no diarrhea, no dysuria, no falls, no fever, no foul-smelling urine and no vomiting        Home Medications Prior to Admission medications   Medication Sig Start Date End Date Taking? Authorizing Provider  acetaminophen (TYLENOL) 500 MG tablet Take 500 mg by mouth every 6 (six) hours as needed for moderate pain.    [provider]  albuterol (VENTOLIN HFA) 108 (90 Base) MCG/ACT inhaler Inhale 2 puffs into the lungs every 6 (six) hours as needed for wheezing or shortness of breath. 07/04/23   Sheliah Hatch, MD  doxycycline (VIBRA-TABS) 100 MG tablet Take 1 tablet (100 mg total) by mouth 2 (two) times daily. 07/10/23   Sheliah Hatch, MD  esomeprazole (NEXIUM) 40 MG capsule PLACE 1 CAPSULE IN FEEDING TUBE TWICE DAILY. 08/01/23   Sheliah Hatch, MD  FLUoxetine (PROZAC) 20 MG capsule TAKE 1 CAPSULE BY MOUTH DAILY. 08/01/23    Sheliah Hatch, MD  fluticasone (FLONASE) 50 MCG/ACT nasal spray Place 1 spray into both nostrils as needed for allergies or rhinitis.    [provider]  levothyroxine (SYNTHROID) 112 MCG tablet TAKE ONE TABLET BY MOUTH ONCE DAILY. 08/01/23   Sheliah Hatch, MD  levothyroxine (SYNTHROID) 125 MCG tablet Take 1 tablet (125 mcg total) by mouth daily before breakfast. 02/28/22   Sheliah Hatch, MD  loratadine (CLARITIN) 10 MG tablet Take 10 mg by mouth daily.    [provider]  meloxicam (MOBIC) 7.5 MG tablet TAKE (1) TABLET BY MOUTH TWICE DAILY. 03/10/23   Sheliah Hatch, MD  metoCLOPramide (REGLAN) 5 MG tablet Take 1 tablet (5 mg total) by mouth in the morning and at bedtime. 11/05/22   Cirigliano, Vito V, DO  nutrition supplement, JUVEN, (JUVEN) PACK Place 1 packet into feeding tube daily.    [provider]  Nutritional Supplements (FEEDING SUPPLEMENT, JEVITY 1.5 CAL/FIBER,) LIQD Place 50 mL/hr into feeding tube continuous. Dose to be titrated per RD at home. 01/24/23   Sheliah Hatch, MD  nystatin (MYCOSTATIN/NYSTOP) powder Apply 1 Application topically 3 (three) times daily. 07/04/23   Sheliah Hatch, MD  ondansetron (ZOFRAN) 8 MG tablet Take 1 tablet (8 mg total) by mouth 3 (three) times daily before meals. Patient taking differently: Take 8 mg by mouth every 8 (eight) hours as needed for vomiting  or nausea. 12/04/21   Sharee Holster, NP  ondansetron (ZOFRAN-ODT) 4 MG disintegrating tablet TAKE (1) TABLET BY MOUTH EVERY EIGHT HOURS AS NEEDED. 09/09/22   Sheliah Hatch, MD  Polyvinyl Alcohol-Povidone (ARTIFICIAL TEARS) 5-6 MG/ML SOLN Place 2 drops into both eyes daily.    [provider]  propranolol ER (INDERAL LA) 60 MG 24 hr capsule Take 60 mg by mouth daily. 12/31/21   [provider]  UNABLE TO FIND TUBE FEEDS: 1/2 carton (118 ml) Jevity 1.5 QID with 60 ml free water before and 60 ml free water after each TF bolus and  45 ml Prosource TF (or equivalent) BID.    Maintain this regimen x4 days.    Then increase to 1 carton (237 ml) Jevity 1.5 QID with 60 ml free water before and 60 ml free water after each TF bolus and 45 ml Prosource TF (or equivalent) BID.    This goal regimen will provide 1500 kcal (86% kcal need), 82 grams protein (96% protein need), and 1200 ml free water. 01/18/22   Rai, Delene Ruffini, MD  vitamin B-12 (CYANOCOBALAMIN) 250 MCG tablet Take 250 mcg by mouth daily.    [provider]  Water For Irrigation, Sterile (FREE WATER) SOLN Place 100 mLs into feeding tube 6 (six) times daily. 01/23/22   Lorin Glass, MD      Allergies    Gabapentin, Other, and Morphine    Review of Systems   Review of Systems  Constitutional:  Positive for fatigue. Negative for fever.  Respiratory:  Positive for cough and shortness of breath.   Cardiovascular:  Positive for chest pain.  Gastrointestinal:  Positive for nausea. Negative for abdominal pain, diarrhea and vomiting.  Genitourinary:  Negative for dysuria.  Musculoskeletal:  Negative for falls.  Neurological:  Positive for weakness.    Physical Exam Updated Vital Signs BP 134/71   Pulse (!) 102   Temp 98.6 F (37 C) (Oral)   Resp (!) 24   Ht 5\' 4"  (1.626 m)   Wt 64.4 kg   SpO2 90%   BMI 24.37 kg/m  Physical Exam Vitals and nursing note reviewed.  Constitutional:      General: She is not in acute distress.    Appearance: Normal appearance. She is well-developed.  HENT:     Head: Normocephalic and atraumatic.  Eyes:     Conjunctiva/sclera: Conjunctivae normal.  Cardiovascular:     Rate and Rhythm: Normal rate and regular rhythm.     Heart sounds: No murmur heard. Pulmonary:     Effort: Pulmonary effort is normal. No respiratory distress.     Breath sounds: Normal breath sounds.  Abdominal:     Palpations: Abdomen is soft.     Tenderness: There is no abdominal tenderness. There is no guarding or rebound.     Comments: She  has a small feeding tube in place.  There is some erythema at the site with a little bit of whitish discharge question fungal infection.  Musculoskeletal:        General: No deformity.     Cervical back: Neck supple.     Right lower leg: No edema.     Left lower leg: No edema.  Skin:    General: Skin is warm and dry.     Capillary Refill: Capillary refill takes less than 2 seconds.  Neurological:     General: No focal deficit present.     Mental Status: She is alert.  ED Results / Procedures / Treatments   Labs (all labs ordered are listed, but only abnormal results are displayed) Labs Reviewed  COMPREHENSIVE METABOLIC PANEL - Abnormal; Notable for the following components:      Result Value   Sodium 134 (*)    Chloride 95 (*)    Glucose, Bld 131 (*)    BUN 28 (*)    Calcium 8.3 (*)    Albumin 3.0 (*)    All other components within normal limits  CBC WITH DIFFERENTIAL/PLATELET - Abnormal; Notable for the following components:   Lymphs Abs 0.6 (*)    All other components within normal limits  CULTURE, BLOOD (ROUTINE X 2)  CULTURE, BLOOD (ROUTINE X 2)  SARS CORONAVIRUS 2 BY RT PCR  LACTIC ACID, PLASMA  PROTIME-INR  MAGNESIUM  BRAIN NATRIURETIC PEPTIDE  URINALYSIS, W/ REFLEX TO CULTURE (INFECTION SUSPECTED)  TROPONIN I (HIGH SENSITIVITY)  TROPONIN I (HIGH SENSITIVITY)    EKG EKG Interpretation Date/Time:  Thursday August 21 2023 13:46:47 EDT Ventricular Rate:  99 PR Interval:  150 QRS Duration:  74 QT Interval:  346 QTC Calculation: 444 R Axis:   14  Text Interpretation: Normal sinus rhythm Low voltage QRS Cannot rule out Anterior infarct , age undetermined Abnormal ECG When compared with ECG of 02-Sep-2022 21:19, increased rate now Confirmed by Meridee Score [phone]) on 08/21/2023 2:14:53 PM  Radiology DG Chest Port 1 View  Result Date: 08/21/2023 CLINICAL DATA:  Cough, nausea, weakness, difficulty breathing EXAM: PORTABLE CHEST 1 VIEW COMPARISON:   07/04/2023 FINDINGS: Low lung volumes with vascular congestion. Unchanged cardiac and mediastinal contours. Elevation of the right hemidiaphragm. Small left pleural effusion. Previously noted right midlung opacity is not seen, which may be due to lower lung volumes. Hazy right upper lung and left lower lung opacities. No pneumothorax. Acute osseous abnormality. IMPRESSION: 1. Low lung volumes with vascular congestion. 2. Hazy right upper lung and left lower lung opacities, which could be due to atelectasis or infection. 3. Small left pleural effusion. Electronically Signed   By: Wiliam Ke M.D.   On: 08/21/2023 15:34    Procedures Procedures    Medications Ordered in ED Medications  iohexol (OMNIPAQUE) 300 MG/ML solution 75 mL (has no administration in time range)  sodium chloride 0.9 % bolus 500 mL (500 mLs Intravenous New Bag/Given 08/21/23 1613)    ED Course/ Medical Decision Making/ A&P                                 Medical Decision Making Amount and/or Complexity of Data Reviewed Labs: ordered. Radiology: ordered. ECG/medicine tests: ordered.  Risk Prescription drug management.   This patient complains of endhole weakness and fatigue, cough and shortness of breath, chest pain; this involves an extensive number of treatment Options and is a complaint that carries with it a high risk of complications and morbidity. The differential includes pneumonia, PE, pneumothorax, CHF, dehydration, metabolic derangement, anemia, ACS  I ordered, reviewed and interpreted labs, which included CBC with normal white count normal hemoglobin, chemistries fairly unremarkable, lactate normal, troponin unremarkable, BMP flat, COVID-negative I ordered medication IV fluids and reviewed PMP when indicated. I ordered imaging studies which included chest x-ray and I independently    visualized and interpreted imaging which showed low lung volumes vascular congestion Additional history obtained from  patient's husband and daughter Previous records obtained and reviewed in epic including prior PCP notes Cardiac monitoring reviewed,  sinus rhythm Social determinants considered, no significant barriers Critical Interventions: None  After the interventions stated above, I reevaluated the patient and found patient still to be very weak and not hypoxic although with increased work of breathing Admission and further testing considered, patient's care signed out to Dr. Estell Harpin to follow-up on results of labs.  Likely will need some sort of chest imaging more than the chest x-ray.  Anticipate would benefit from admission for further treatment.         Final Clinical Impression(s) / ED Diagnoses Final diagnoses:  General weakness  Shortness of breath    Rx / DC Orders ED Discharge Orders     None         Terrilee Files, MD 08/21/23 [phone]

## 2023-08-21 NOTE — Telephone Encounter (Signed)
Order called into [place] pharmacy for the [facility] Cream

## 2023-08-22 DIAGNOSIS — R6339 Other feeding difficulties: Secondary | ICD-10-CM | POA: Diagnosis not present

## 2023-08-22 DIAGNOSIS — Z79890 Hormone replacement therapy (postmenopausal): Secondary | ICD-10-CM | POA: Diagnosis not present

## 2023-08-22 DIAGNOSIS — E039 Hypothyroidism, unspecified: Secondary | ICD-10-CM | POA: Diagnosis not present

## 2023-08-22 DIAGNOSIS — R488 Other symbolic dysfunctions: Secondary | ICD-10-CM | POA: Diagnosis not present

## 2023-08-22 DIAGNOSIS — R531 Weakness: Secondary | ICD-10-CM | POA: Diagnosis not present

## 2023-08-22 DIAGNOSIS — Z9842 Cataract extraction status, left eye: Secondary | ICD-10-CM | POA: Diagnosis not present

## 2023-08-22 DIAGNOSIS — Z978 Presence of other specified devices: Secondary | ICD-10-CM | POA: Diagnosis not present

## 2023-08-22 DIAGNOSIS — M48061 Spinal stenosis, lumbar region without neurogenic claudication: Secondary | ICD-10-CM | POA: Diagnosis not present

## 2023-08-22 DIAGNOSIS — R54 Age-related physical debility: Secondary | ICD-10-CM | POA: Diagnosis present

## 2023-08-22 DIAGNOSIS — Z833 Family history of diabetes mellitus: Secondary | ICD-10-CM | POA: Diagnosis not present

## 2023-08-22 DIAGNOSIS — E890 Postprocedural hypothyroidism: Secondary | ICD-10-CM | POA: Diagnosis not present

## 2023-08-22 DIAGNOSIS — Z741 Need for assistance with personal care: Secondary | ICD-10-CM | POA: Diagnosis not present

## 2023-08-22 DIAGNOSIS — Z8616 Personal history of COVID-19: Secondary | ICD-10-CM | POA: Diagnosis not present

## 2023-08-22 DIAGNOSIS — M6281 Muscle weakness (generalized): Secondary | ICD-10-CM | POA: Diagnosis not present

## 2023-08-22 DIAGNOSIS — M159 Polyosteoarthritis, unspecified: Secondary | ICD-10-CM | POA: Diagnosis not present

## 2023-08-22 DIAGNOSIS — Z961 Presence of intraocular lens: Secondary | ICD-10-CM | POA: Diagnosis not present

## 2023-08-22 DIAGNOSIS — L304 Erythema intertrigo: Secondary | ICD-10-CM | POA: Diagnosis present

## 2023-08-22 DIAGNOSIS — E440 Moderate protein-calorie malnutrition: Secondary | ICD-10-CM | POA: Diagnosis not present

## 2023-08-22 DIAGNOSIS — Z931 Gastrostomy status: Secondary | ICD-10-CM | POA: Diagnosis not present

## 2023-08-22 DIAGNOSIS — K219 Gastro-esophageal reflux disease without esophagitis: Secondary | ICD-10-CM | POA: Diagnosis not present

## 2023-08-22 DIAGNOSIS — G2581 Restless legs syndrome: Secondary | ICD-10-CM | POA: Diagnosis not present

## 2023-08-22 DIAGNOSIS — R1312 Dysphagia, oropharyngeal phase: Secondary | ICD-10-CM | POA: Diagnosis not present

## 2023-08-22 DIAGNOSIS — R262 Difficulty in walking, not elsewhere classified: Secondary | ICD-10-CM | POA: Diagnosis not present

## 2023-08-22 DIAGNOSIS — R1310 Dysphagia, unspecified: Secondary | ICD-10-CM | POA: Diagnosis present

## 2023-08-22 DIAGNOSIS — L89152 Pressure ulcer of sacral region, stage 2: Secondary | ICD-10-CM | POA: Diagnosis present

## 2023-08-22 DIAGNOSIS — Z6824 Body mass index (BMI) 24.0-24.9, adult: Secondary | ICD-10-CM | POA: Diagnosis not present

## 2023-08-22 DIAGNOSIS — R42 Dizziness and giddiness: Secondary | ICD-10-CM | POA: Diagnosis not present

## 2023-08-22 DIAGNOSIS — E785 Hyperlipidemia, unspecified: Secondary | ICD-10-CM | POA: Diagnosis not present

## 2023-08-22 DIAGNOSIS — R0602 Shortness of breath: Secondary | ICD-10-CM | POA: Diagnosis not present

## 2023-08-22 DIAGNOSIS — Z823 Family history of stroke: Secondary | ICD-10-CM | POA: Diagnosis not present

## 2023-08-22 DIAGNOSIS — R0609 Other forms of dyspnea: Secondary | ICD-10-CM | POA: Diagnosis not present

## 2023-08-22 DIAGNOSIS — F418 Other specified anxiety disorders: Secondary | ICD-10-CM | POA: Diagnosis not present

## 2023-08-22 DIAGNOSIS — J9611 Chronic respiratory failure with hypoxia: Secondary | ICD-10-CM | POA: Diagnosis not present

## 2023-08-22 DIAGNOSIS — J309 Allergic rhinitis, unspecified: Secondary | ICD-10-CM | POA: Diagnosis not present

## 2023-08-22 DIAGNOSIS — Z96643 Presence of artificial hip joint, bilateral: Secondary | ICD-10-CM | POA: Diagnosis not present

## 2023-08-22 DIAGNOSIS — J9601 Acute respiratory failure with hypoxia: Secondary | ICD-10-CM | POA: Diagnosis not present

## 2023-08-22 DIAGNOSIS — F32A Depression, unspecified: Secondary | ICD-10-CM | POA: Diagnosis present

## 2023-08-22 DIAGNOSIS — Z8249 Family history of ischemic heart disease and other diseases of the circulatory system: Secondary | ICD-10-CM | POA: Diagnosis not present

## 2023-08-22 DIAGNOSIS — L308 Other specified dermatitis: Secondary | ICD-10-CM | POA: Diagnosis not present

## 2023-08-22 DIAGNOSIS — I10 Essential (primary) hypertension: Secondary | ICD-10-CM | POA: Diagnosis not present

## 2023-08-22 DIAGNOSIS — Z1152 Encounter for screening for COVID-19: Secondary | ICD-10-CM | POA: Diagnosis not present

## 2023-08-22 DIAGNOSIS — Z96612 Presence of left artificial shoulder joint: Secondary | ICD-10-CM | POA: Diagnosis not present

## 2023-08-22 DIAGNOSIS — Z9841 Cataract extraction status, right eye: Secondary | ICD-10-CM | POA: Diagnosis not present

## 2023-08-22 DIAGNOSIS — Z79899 Other long term (current) drug therapy: Secondary | ICD-10-CM | POA: Diagnosis not present

## 2023-08-22 DIAGNOSIS — I5033 Acute on chronic diastolic (congestive) heart failure: Secondary | ICD-10-CM | POA: Diagnosis not present

## 2023-08-22 DIAGNOSIS — F419 Anxiety disorder, unspecified: Secondary | ICD-10-CM | POA: Diagnosis present

## 2023-08-22 LAB — CBC WITH DIFFERENTIAL/PLATELET
Abs Immature Granulocytes: 0.01 10*3/uL (ref 0.00–0.07)
Basophils Absolute: 0 10*3/uL (ref 0.0–0.1)
Basophils Relative: 0 %
Eosinophils Absolute: 0.4 10*3/uL (ref 0.0–0.5)
Eosinophils Relative: 8 %
HCT: 34.8 % — ABNORMAL LOW (ref 36.0–46.0)
Hemoglobin: 11.2 g/dL — ABNORMAL LOW (ref 12.0–15.0)
Immature Granulocytes: 0 %
Lymphocytes Relative: 12 %
Lymphs Abs: 0.6 10*3/uL — ABNORMAL LOW (ref 0.7–4.0)
MCH: 31.9 pg (ref 26.0–34.0)
MCHC: 32.2 g/dL (ref 30.0–36.0)
MCV: 99.1 fL (ref 80.0–100.0)
Monocytes Absolute: 0.5 10*3/uL (ref 0.1–1.0)
Monocytes Relative: 10 %
Neutro Abs: 3.7 10*3/uL (ref 1.7–7.7)
Neutrophils Relative %: 70 %
Platelets: 167 10*3/uL (ref 150–400)
RBC: 3.51 MIL/uL — ABNORMAL LOW (ref 3.87–5.11)
RDW: 14.1 % (ref 11.5–15.5)
WBC: 5.3 10*3/uL (ref 4.0–10.5)
nRBC: 0 % (ref 0.0–0.2)

## 2023-08-22 LAB — COMPREHENSIVE METABOLIC PANEL
ALT: 18 U/L (ref 0–44)
AST: 15 U/L (ref 15–41)
Albumin: 2.3 g/dL — ABNORMAL LOW (ref 3.5–5.0)
Alkaline Phosphatase: 77 U/L (ref 38–126)
Anion gap: 7 (ref 5–15)
BUN: 20 mg/dL (ref 8–23)
CO2: 31 mmol/L (ref 22–32)
Calcium: 8 mg/dL — ABNORMAL LOW (ref 8.9–10.3)
Chloride: 101 mmol/L (ref 98–111)
Creatinine, Ser: 0.44 mg/dL (ref 0.44–1.00)
GFR, Estimated: >60 mL/min (ref >60)
Glucose, Bld: 103 mg/dL — ABNORMAL HIGH (ref 70–99)
Potassium: 3.9 mmol/L (ref 3.5–5.1)
Sodium: 139 mmol/L (ref 135–145)
Total Bilirubin: 0.6 mg/dL (ref 0.3–1.2)
Total Protein: 5.8 g/dL — ABNORMAL LOW (ref 6.5–8.1)

## 2023-08-22 LAB — MAGNESIUM: Magnesium: 1.9 mg/dL (ref 1.7–2.4)

## 2023-08-22 LAB — TSH: TSH: 0.742 u[IU]/mL (ref 0.350–4.500)

## 2023-08-22 MED ORDER — JEVITY 1.2 CAL PO LIQD
1000.0000 mL | ORAL | Status: DC
  Administered 2023-08-22: 1000 mL

## 2023-08-22 MED ORDER — SODIUM CHLORIDE 0.9 % IV SOLN
1.0000 g | INTRAVENOUS | Status: DC
  Administered 2023-08-22 – 2023-08-23 (×2): 1 g via INTRAVENOUS
  Filled 2023-08-22 (×2): qty 10

## 2023-08-22 MED ORDER — SODIUM CHLORIDE 0.9 % IV SOLN
500.0000 mg | INTRAVENOUS | Status: DC
  Administered 2023-08-22 – 2023-08-23 (×2): 500 mg via INTRAVENOUS
  Filled 2023-08-22 (×2): qty 5

## 2023-08-22 MED ORDER — JEVITY 1.5 CAL/FIBER PO LIQD
50.0000 mL/h | ORAL | Status: DC
  Filled 2023-08-22: qty 237

## 2023-08-22 MED ORDER — LORATADINE 10 MG PO TABS
10.0000 mg | ORAL_TABLET | Freq: Every day | ORAL | Status: DC
  Administered 2023-08-22 – 2023-08-25 (×4): 10 mg via ORAL
  Filled 2023-08-22 (×4): qty 1

## 2023-08-22 MED ORDER — MORPHINE SULFATE (PF) 2 MG/ML IV SOLN: 2.0000 mg | INTRAVENOUS | Status: DC | PRN

## 2023-08-22 MED ORDER — OXYCODONE HCL 5 MG PO TABS
5.0000 mg | ORAL_TABLET | ORAL | Status: DC | PRN
  Filled 2023-08-22: qty 1

## 2023-08-22 MED ORDER — ACETAMINOPHEN 650 MG RE SUPP: 650.0000 mg | Freq: Four times a day (QID) | RECTAL | Status: DC | PRN

## 2023-08-22 MED ORDER — LEVOTHYROXINE SODIUM 112 MCG PO TABS
112.0000 ug | ORAL_TABLET | Freq: Every day | ORAL | Status: DC
  Administered 2023-08-23 – 2023-08-25 (×3): 112 ug via ORAL
  Filled 2023-08-22 (×4): qty 1

## 2023-08-22 MED ORDER — ONDANSETRON HCL 4 MG PO TABS: 4.0000 mg | ORAL_TABLET | Freq: Four times a day (QID) | ORAL | Status: DC | PRN

## 2023-08-22 MED ORDER — ALBUTEROL SULFATE (2.5 MG/3ML) 0.083% IN NEBU: 2.5000 mg | INHALATION_SOLUTION | RESPIRATORY_TRACT | Status: DC | PRN

## 2023-08-22 MED ORDER — PANTOPRAZOLE SODIUM 40 MG PO TBEC
40.0000 mg | DELAYED_RELEASE_TABLET | Freq: Every day | ORAL | Status: DC
  Filled 2023-08-22 (×3): qty 1

## 2023-08-22 MED ORDER — FUROSEMIDE 10 MG/ML IJ SOLN
20.0000 mg | Freq: Once | INTRAMUSCULAR | Status: AC
  Administered 2023-08-22: 20 mg via INTRAVENOUS
  Filled 2023-08-22: qty 2

## 2023-08-22 MED ORDER — JEVITY 1.5 CAL/FIBER PO LIQD
50.0000 mL/h | ORAL | Status: DC
  Filled 2023-08-22 (×15): qty 237

## 2023-08-22 MED ORDER — ONDANSETRON HCL 4 MG/2ML IJ SOLN
4.0000 mg | Freq: Four times a day (QID) | INTRAMUSCULAR | Status: DC | PRN
  Administered 2023-08-22 – 2023-08-25 (×3): 4 mg via INTRAVENOUS
  Filled 2023-08-22 (×3): qty 2

## 2023-08-22 MED ORDER — HEPARIN SODIUM (PORCINE) 5000 UNIT/ML IJ SOLN
5000.0000 [IU] | Freq: Three times a day (TID) | INTRAMUSCULAR | Status: DC
  Administered 2023-08-22 – 2023-08-25 (×11): 5000 [IU] via SUBCUTANEOUS
  Filled 2023-08-22 (×11): qty 1

## 2023-08-22 MED ORDER — FREE WATER
150.0000 mL | Freq: Four times a day (QID) | Status: DC
  Administered 2023-08-22 – 2023-08-25 (×12): 150 mL via ORAL

## 2023-08-22 MED ORDER — JEVITY 1.2 CAL PO LIQD
1000.0000 mL | ORAL | Status: DC
  Administered 2023-08-22 – 2023-08-24 (×2): 1000 mL via ORAL

## 2023-08-22 MED ORDER — ACETAMINOPHEN 325 MG PO TABS
650.0000 mg | ORAL_TABLET | Freq: Four times a day (QID) | ORAL | Status: DC | PRN
  Administered 2023-08-25: 650 mg via ORAL
  Filled 2023-08-22: qty 2

## 2023-08-22 MED ORDER — FREE WATER
150.0000 mL | Freq: Four times a day (QID) | Status: DC
  Administered 2023-08-22: 150 mL

## 2023-08-22 MED ORDER — GERHARDT'S BUTT CREAM
TOPICAL_CREAM | Freq: Three times a day (TID) | CUTANEOUS | Status: DC
  Filled 2023-08-22: qty 1

## 2023-08-22 MED ORDER — NYSTATIN 100000 UNIT/GM EX POWD
Freq: Three times a day (TID) | CUTANEOUS | Status: DC
  Filled 2023-08-22: qty 15

## 2023-08-22 MED ORDER — FLUOXETINE HCL 20 MG PO CAPS
20.0000 mg | ORAL_CAPSULE | Freq: Every day | ORAL | Status: DC
  Administered 2023-08-22 – 2023-08-25 (×4): 20 mg via ORAL
  Filled 2023-08-22 (×4): qty 1

## 2023-08-22 MED ORDER — PROSOURCE TF20 ENFIT COMPATIBL EN LIQD
60.0000 mL | Freq: Every day | ENTERAL | Status: DC
  Administered 2023-08-22 – 2023-08-25 (×4): 60 mL
  Filled 2023-08-22 (×4): qty 60

## 2023-08-22 NOTE — Assessment & Plan Note (Signed)
-  continue synthroid

## 2023-08-22 NOTE — Plan of Care (Signed)
  Problem: Acute Rehab PT Goals(only PT should resolve) Goal: Pt Will Go Supine/Side To Sit Outcome: Progressing Flowsheets (Taken 08/22/2023 1557) Pt will go Supine/Side to Sit:  with minimal assist  with moderate assist Goal: Patient Will Transfer Sit To/From Stand Outcome: Progressing Flowsheets (Taken 08/22/2023 1557) Patient will transfer sit to/from stand:  with minimal assist  with moderate assist Goal: Pt Will Transfer Bed To Chair/Chair To Bed Outcome: Progressing Flowsheets (Taken 08/22/2023 1557) Pt will Transfer Bed to Chair/Chair to Bed:  with min assist  with mod assist Goal: Pt Will Ambulate Outcome: Progressing Flowsheets (Taken 08/22/2023 1557) Pt will Ambulate:  25 feet  with minimal assist  with moderate assist  with rolling walker   3:57 PM, 08/22/23 Ocie Bob, MPT Physical Therapist with [facility] [phone] office [phone] mobile phone

## 2023-08-22 NOTE — TOC CM/SW Note (Signed)
Transition of Care [facility]) - Inpatient Brief Assessment   Patient Details  Name: Tasha Moss MRN: [id number] Date of Birth: Feb 21, 1936  Transition of Care [facility]) CM/SW Contact:    Villa Herb, LCSWA Phone Number: 08/22/2023, 10:39 AM   Clinical Narrative: Transition of Care Department [facility]) has reviewed patient and no TOC needs have been identified at this time. We will continue to monitor patient advancement through interdisciplinary progression rounds. If new patient transition needs arise, please place a TOC consult.  Transition of Care Asessment:   Patient has primary care physician: Yes Home environment has been reviewed: from home Prior level of function:: independent Prior/Current Home Services: No current home services Social Determinants of Health Reivew: SDOH reviewed no interventions necessary Readmission risk has been reviewed: Yes Transition of care needs: no transition of care needs at this time

## 2023-08-22 NOTE — Assessment & Plan Note (Signed)
Continue tube feeds.

## 2023-08-22 NOTE — H&P (Signed)
History and Physical    Patient: Tasha Moss ZOX:[id number] DOB: 01-Jul-1936 DOA: 08/21/2023 DOS: the patient was seen and examined on 08/22/2023 PCP: Sheliah Hatch, MD  Patient coming from: Home  Chief Complaint: No chief complaint on file.  HPI: MADILYNNE Moss is a 87 y.o. female with medical history significant of anxiety, GERD, hyperlipidemia, hypertension, hypothyroidism, and more presents ED with a chief complaint of dyspnea.  Patient reports that she has had dyspnea, coughing, weakness.  Her PCP could not see her so she is here now.  She reports this started 3 weeks ago when she was diagnosed with pneumonia after a chest x-ray was done.  She was started on antibiotic and she finished the full course.  Patient reports she has not been any better since then.  She has had a dry cough, no fever.  She reports no swelling in her legs.  She cannot lay flat, but has a difficult time knowing if it is due to breathing or if it is because of spinal issues that she has had chronically.  Patient reports that she has had some pain in her back and shoulders.  This started about 3 weeks ago as well.  She has been treated with Tylenol and that has helped.  Patient has no other complaints at this time.  Patient does not smoke and does not drink.  Patient is full code. Review of Systems: As mentioned in the history of present illness. All other systems reviewed and are negative. Past Medical History:  Diagnosis Date   Allergy    seasonal & dust   Anxiety    Arthritis    DJD   Cellulitis    Family history of adverse reaction to anesthesia    n/v    GERD (gastroesophageal reflux disease)    History of vertigo    Hyperlipidemia    Hypertension    Hypothyroidism    PONV (postoperative nausea and vomiting)    Thyroid disease    hypothyroidism post Hashimoto's & partial thyroidectomy   Past Surgical History:  Procedure Laterality Date   BIOPSY  01/11/2022   Procedure: BIOPSY;  Surgeon:  Meridee Score Netty Starring., MD;  Location: [facility];  Service: Gastroenterology;;   CATARACT EXTRACTION W/ INTRAOCULAR LENS  IMPLANT, BILATERAL     COLONOSCOPY     diverticulosis   ESOPHAGOGASTRODUODENOSCOPY (EGD) WITH PROPOFOL Tasha Moss 01/11/2022   Procedure: ESOPHAGOGASTRODUODENOSCOPY (EGD) WITH PROPOFOL;  Surgeon: Lemar Lofty., MD;  Location: [facility];  Service: Gastroenterology;  Laterality: Tasha Moss;   GASTROSTOMY Tasha Moss 01/17/2022   Procedure: OPEN GASTROSTOMY PLACEMENT;  Surgeon: Harriette Bouillon, MD;  Location: [facility];  Service: General;  Laterality: Tasha Moss;   IR GASTROSTOMY TUBE MOD SED  01/15/2022   IR REPLACE G-TUBE SIMPLE WO FLUORO  03/13/2023   IR REPLC GASTRO/COLONIC TUBE PERCUT W/FLUORO  02/20/2023   JOINT REPLACEMENT  1995 & 1999   THR bilaterally    lumbar disc repair  1989   Dr Mercy Riding   SAVORY DILATION Tasha Moss 01/11/2022   Procedure: Gaspar Bidding DILATION;  Surgeon: Lemar Lofty., MD;  Location: [facility];  Service: Gastroenterology;  Laterality: Tasha Moss;   SPINE SURGERY     cervical ; Dr Venetia Maxon   THYROIDECTOMY, PARTIAL     benign nodule   TONSILLECTOMY AND ADENOIDECTOMY     TOTAL SHOULDER ARTHROPLASTY Left 02/27/2016   Procedure: LEFT REVERSE TOTAL SHOULDER ARTHROPLASTY;  Surgeon: Sheral Apley, MD;  Location: [facility];  Service: Orthopedics;  Laterality: Left;  vitreous detachment     Dr Nelle Don   Social History:  reports that she has never smoked. She has never used smokeless tobacco. She reports that she does not drink alcohol and does not use drugs.  Allergies  Allergen Reactions   Gabapentin Nausea And Vomiting    hallucinations hallucinations   Other Nausea And Vomiting    Strong pain medications cause n/v Strong pain medications cause n/v   Morphine Nausea And Vomiting    Nausea & vomiting Nausea & vomiting    Family History  Problem Relation Age of Onset   Diabetes Mother    Hepatitis Mother        Hepatitis B,? etiology   Miscarriages /  Stillbirths Mother    Stroke Mother 43   Heart disease Father        CHF   Cancer Paternal Aunt        GI cancers   Heart disease Paternal Aunt        CHF   Cancer Paternal Uncle        lung, GI   Heart disease Paternal Uncle        CHF   Colon cancer Neg Hx    Esophageal cancer Neg Hx    Pancreatic cancer Neg Hx    Rectal cancer Neg Hx    Stomach cancer Neg Hx     Prior to Admission medications   Medication Sig Start Date End Date Taking? Authorizing Provider  acetaminophen (TYLENOL) 500 MG tablet Take 500 mg by mouth every 6 (six) hours as needed for moderate pain.   Yes [provider]  albuterol (VENTOLIN HFA) 108 (90 Base) MCG/ACT inhaler Inhale 2 puffs into the lungs every 6 (six) hours as needed for wheezing or shortness of breath. 07/04/23  Yes Sheliah Hatch, MD  esomeprazole (NEXIUM) 40 MG capsule PLACE 1 CAPSULE IN FEEDING TUBE TWICE DAILY. Patient taking differently: Take 40 mg by mouth See admin instructions. PLACE 1 CAPSULE IN FEEDING TUBE TWICE DAILY. 08/01/23  Yes Sheliah Hatch, MD  FLUoxetine (PROZAC) 20 MG capsule TAKE 1 CAPSULE BY MOUTH DAILY. 08/01/23  Yes Sheliah Hatch, MD  fluticasone (FLONASE) 50 MCG/ACT nasal spray Place 1 spray into both nostrils as needed for allergies or rhinitis.   Yes [provider]  levothyroxine (SYNTHROID) 112 MCG tablet TAKE ONE TABLET BY MOUTH ONCE DAILY. 08/01/23  Yes Sheliah Hatch, MD  loratadine (CLARITIN) 10 MG tablet Take 10 mg by mouth daily.   Yes [provider]  meloxicam (MOBIC) 7.5 MG tablet TAKE (1) TABLET BY MOUTH TWICE DAILY. 03/10/23  Yes Sheliah Hatch, MD  metoCLOPramide (REGLAN) 5 MG tablet Take 1 tablet (5 mg total) by mouth in the morning and at bedtime. 11/05/22  Yes Cirigliano, Vito V, DO  Nutritional Supplements (FEEDING SUPPLEMENT, JEVITY 1.5 CAL/FIBER,) LIQD Place 50 mL/hr into feeding tube continuous. Dose to be titrated per RD at home. 01/24/23  Yes Sheliah Hatch, MD  nystatin (MYCOSTATIN/NYSTOP) powder Apply 1 Application topically 3 (three) times daily. 07/04/23  Yes Sheliah Hatch, MD  Polyvinyl Alcohol-Povidone (ARTIFICIAL TEARS) 5-6 MG/ML SOLN Place 1 drop into both eyes daily.   Yes [provider]  UNABLE TO FIND TUBE FEEDS: 1/2 carton (118 ml) Jevity 1.5 QID with 60 ml free water before and 60 ml free water after each TF bolus and 45 ml Prosource TF (or equivalent) BID.    Maintain this regimen x4 days.  Then increase to 1 carton (237 ml) Jevity 1.5 QID with 60 ml free water before and 60 ml free water after each TF bolus and 45 ml Prosource TF (or equivalent) BID.    This goal regimen will provide 1500 kcal (86% kcal need), 82 grams protein (96% protein need), and 1200 ml free water. 01/18/22  Yes Rai, Delene Ruffini, MD  Water For Irrigation, Sterile (FREE WATER) SOLN Place 100 mLs into feeding tube 6 (six) times daily. 01/23/22  Yes Lorin Glass, MD    Physical Exam: Vitals:   08/22/23 0415 08/22/23 0500 08/22/23 0600 08/22/23 0630  BP:  (!) 109/56 (!) 113/54   Pulse: 83 75 73   Resp: (!) 21 (!) 25 (!) 25   Temp:    98.5 F (36.9 C)  TempSrc:      SpO2: 95% 94% 95%   Weight:      Height:       1.  General: Patient lying supine in bed,  no acute distress   2. Psychiatric: Alert and oriented x 3, mood and behavior normal for situation, pleasant and cooperative with exam   3. Neurologic: Speech and language are normal, face is symmetric, moves all 4 extremities voluntarily, at baseline without acute deficits on limited exam   4. HEENMT:  Head is atraumatic, normocephalic, pupils reactive to light, neck is supple, trachea is midline, mucous membranes are moist   5. Respiratory : Lungs are clear to auscultation bilaterally without wheezing, rhonchi, rales, no cyanosis, no increase in work of breathing or accessory muscle use   6. Cardiovascular : Heart rate normal, rhythm is regular, no murmurs, rubs or  gallops, no peripheral edema, peripheral pulses palpated   7. Gastrointestinal:  Abdomen is soft, nondistended, nontender to palpation bowel sounds active, no masses or organomegaly palpated   8. Skin:  Skin is warm, dry and intact without rashes, acute lesions, or ulcers on limited exam   9.Musculoskeletal:  No acute deformities or trauma, no asymmetry in tone, no peripheral edema, peripheral pulses palpated, no tenderness to palpation in the extremities  Data Reviewed: In the ED Temp 98.4-98.6, heart rate 81-102, respiratory rate 18-30, blood pressure 108/58-176/77 No leukocytosis Hemoglobin 12.8 Chemistry is unremarkable Blood cultures pending CT chest shows moderate left pleural effusion with mild pulmonary edema. Admission requested for acute respiratory failure with hypoxia  Assessment and Plan: * Acute respiratory failure with hypoxia (HCC) - Secondary to pneumonia versus CHF - Troponin troponin 15, 17 - Procalcitonin undetectable indicating is not likely to be bacterial pneumonia - COVID-negative - CT chest with contrast shows moderate left pleural effusion with mild interlobar septal thickening suggestive of pulmonary edema, increased size of left internal mammary lymph node measuring 6 mm previously 1 mm - BNP normal at 63, but there is possible pulmonary edema on CT - Start Lasix - Continue oxygen supplementation as needed until patient can be weaned off - Continue to monitor  Feeding problems -Continue tube feeds  Depression with anxiety -continue prozac  GERD (gastroesophageal reflux disease) -continue PPI  Hypothyroidism - continue synthroid      Advance Care Planning:   Code Status: Full Code  Consults: None at this time  Family Communication: No family at bedside  Severity of Illness: The appropriate patient status for this patient is OBSERVATION. Observation status is judged to be reasonable and necessary in order to provide the required  intensity of service to ensure the patient's safety. The patient's presenting symptoms, physical exam findings, and  initial radiographic and laboratory data in the context of their medical condition is felt to place them at decreased risk for further clinical deterioration. Furthermore, it is anticipated that the patient will be medically stable for discharge from the hospital within 2 midnights of admission.   Author: Lilyan Gilford, DO 08/22/2023 6:37 AM  For on call review [email or web address].

## 2023-08-22 NOTE — Assessment & Plan Note (Signed)
-   Secondary to pneumonia versus CHF - Troponin troponin 15, 17 - Procalcitonin undetectable indicating is not likely to be bacterial pneumonia - COVID-negative - CT chest with contrast shows moderate left pleural effusion with mild interlobar septal thickening suggestive of pulmonary edema, increased size of left internal mammary lymph node measuring 6 mm previously 1 mm - BNP normal at 63, but there is possible pulmonary edema on CT - Start Lasix - Continue oxygen supplementation as needed until patient can be weaned off - Continue to monitor

## 2023-08-22 NOTE — TOC Initial Note (Signed)
Transition of Care [facility]) - Initial/Assessment Note    Patient Details  Name: Tasha Moss MRN: [id number] Date of Birth: [birth year]  Transition of Care [facility]) CM/SW Contact:    Elliot Gault, LCSW Phone Number: 08/22/2023, 3:57 PM  Clinical Narrative:                  Pt admitted from home. PT recommending SNF rehab at dc. Spoke with pt/husband and they are agreeable. CMS provider options reviewed. Will refer as requested.   Pt will need insurance auth prior to SNF. DC timeframe not yet known. TOC will start auth when closer to dc.   Will follow.  Expected Discharge Plan: Skilled Nursing Facility Barriers to Discharge: Continued Medical Work up   Patient Goals and CMS Choice Patient states their goals for this hospitalization and ongoing recovery are:: rehab if needed CMS Medicare.gov Compare Post Acute Care list provided to:: Patient Represenative (must comment) Choice offered to / list presented to : Spouse [place] ownership interest in [facility].provided to:: Spouse    Expected Discharge Plan and Services In-house Referral: Clinical Social Work   Post Acute Care Choice: Skilled Nursing Facility Living arrangements for the past 2 months: Single Family Home                                      Prior Living Arrangements/Services Living arrangements for the past 2 months: Single Family Home Lives with:: Spouse Patient language and need for interpreter reviewed:: Yes Do you feel safe going back to the place where you live?: Yes      Need for Family Participation in Patient Care: Yes (Comment) Care giver support system in place?: Yes (comment) Current home services: DME Criminal Activity/Legal Involvement Pertinent to Current Situation/Hospitalization: No - Comment as needed  Activities of Daily Living Home Assistive Devices/Equipment: Dan Humphreys (specify type) ADL Screening (condition at time of admission) Patient's cognitive ability adequate to  safely complete daily activities?: Yes Is the patient deaf or have difficulty hearing?: Yes Does the patient have difficulty seeing, even when wearing glasses/contacts?: No Does the patient have difficulty concentrating, remembering, or making decisions?: Yes Patient able to express need for assistance with ADLs?: Yes Does the patient have difficulty dressing or bathing?: No Independently performs ADLs?: No Communication: Independent Dressing (OT): Needs assistance Is this a change from baseline?: Pre-admission baseline Grooming: Needs assistance Is this a change from baseline?: Pre-admission baseline Feeding: Needs assistance Is this a change from baseline?: Pre-admission baseline Bathing: Needs assistance Is this a change from baseline?: Pre-admission baseline Toileting: Needs assistance Is this a change from baseline?: Pre-admission baseline In/Out Bed: Needs assistance Is this a change from baseline?: Pre-admission baseline Walks in Home: Needs assistance Is this a change from baseline?: Pre-admission baseline Does the patient have difficulty walking or climbing stairs?: Yes Weakness of Legs: Both Weakness of Arms/Hands: Both  Permission Sought/Granted Permission sought to share information with : Oceanographer granted to share information with : Yes, Verbal Permission Granted     Permission granted to share info w AGENCY: SNF        Emotional Assessment       Orientation: : Oriented to Self, Oriented to Place, Oriented to  Time, Oriented to Situation Alcohol / Substance Use: Not Applicable Psych Involvement: No (comment)  Admission diagnosis:  Shortness of breath [R06.02] General weakness [R53.1] Acute respiratory failure with hypoxia (  HCC) [J96.01] Community acquired pneumonia of right middle lobe of lung [J18.9] Patient Active Problem List   Diagnosis Date Noted   Acute respiratory failure with hypoxia (HCC) 08/21/2023   Generalized  weakness 09/03/2022   Dehydration 09/03/2022   Feeding by G-tube (HCC) 09/03/2022   Cellulitis of buttock 09/02/2022   Feeding problems    Malnutrition of moderate degree 01/09/2022   Sacral decubitus ulcer, stage II (HCC) 01/08/2022   Failure to thrive in adult 01/07/2022   Non-seasonal allergic rhinitis 11/29/2021   SARS-associated coronavirus exposure 11/24/2021   Unspecified protein-calorie malnutrition (HCC) 11/24/2021   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) 11/06/2021   Elevated BP without diagnosis of hypertension 10/31/2020   Dizziness 10/31/2020   Status post reverse arthroplasty of left shoulder 03/11/2016   Depression with anxiety 03/11/2016   Routine general medical examination at a health care facility 06/29/2014   H/O thyroid nodule 06/21/2013   DIVERTICULOSIS, COLON 07/18/2010   Nausea 07/16/2010   GERD (gastroesophageal reflux disease) 04/09/2010   Stricture and stenosis of esophagus 02/21/2010   Dysphagia 02/09/2010   Osteoarthritis 11/22/2009   Benign paroxysmal positional vertigo 05/15/2009   Essential hypertension 09/28/2008   SPINAL STENOSIS, LUMBAR 09/28/2008   PERNICIOUS ANEMIA 03/28/2008   Hypothyroidism 09/08/2007   HYPERLIPIDEMIA 09/08/2007   RESTLESS LEG SYNDROME 09/08/2007   HASHIMOTO'S THYROIDITIS 02/23/2007   PCP:  Sheliah Hatch, MD Pharmacy:   [facility] - [place], [place] - [address] [place] [address] Phone: [phone] Fax: [phone]     Social Determinants of Health (SDOH) Social History: SDOH Screenings   Food Insecurity: No Food Insecurity (08/22/2023)  Housing: Low Risk  (08/22/2023)  Transportation Needs: No Transportation Needs (08/22/2023)  Utilities: Not At Risk (08/22/2023)  Alcohol Screen: Low Risk  (02/04/2023)  Depression (PHQ2-9): Low Risk  (02/04/2023)  Financial Resource Strain: Low Risk  (02/04/2023)  Physical Activity: Sufficiently Active  (02/04/2023)  Social Connections: Socially Integrated (02/04/2023)  Stress: No Stress Concern Present (02/04/2023)  Tobacco Use: Low Risk  (08/21/2023)   SDOH Interventions:     Readmission Risk Interventions     No data to display

## 2023-08-22 NOTE — NC FL2 (Signed)
[place] MEDICAID FL2 LEVEL OF CARE FORM     IDENTIFICATION  Patient Name: Tasha Moss Birthdate: 03-27-1936 Sex: female Admission Date (Current Location): 08/21/2023  [employer] and [place] Number:  [employer] and Address:  [facility],  [address], [place] [address]      Provider Number: [id number]  Attending Physician Name and Address:  Vassie Loll, MD  Relative Name and Phone Number:       Current Level of Care: Hospital Recommended Level of Care: Skilled Nursing Facility Prior Approval Number:    Date Approved/Denied:   PASRR Number: [id number]  Discharge Plan: SNF    Current Diagnoses: Patient Active Problem List   Diagnosis Date Noted   Acute respiratory failure with hypoxia (HCC) 08/21/2023   Generalized weakness 09/03/2022   Dehydration 09/03/2022   Feeding by G-tube (HCC) 09/03/2022   Cellulitis of buttock 09/02/2022   Feeding problems    Malnutrition of moderate degree 01/09/2022   Sacral decubitus ulcer, stage II (HCC) 01/08/2022   Failure to thrive in adult 01/07/2022   Non-seasonal allergic rhinitis 11/29/2021   SARS-associated coronavirus exposure 11/24/2021   Unspecified protein-calorie malnutrition (HCC) 11/24/2021   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) 11/06/2021   Elevated BP without diagnosis of hypertension 10/31/2020   Dizziness 10/31/2020   Status post reverse arthroplasty of left shoulder 03/11/2016   Depression with anxiety 03/11/2016   Routine general medical examination at a health care facility 06/29/2014   H/O thyroid nodule 06/21/2013   DIVERTICULOSIS, COLON 07/18/2010   Nausea 07/16/2010   GERD (gastroesophageal reflux disease) 04/09/2010   Stricture and stenosis of esophagus 02/21/2010   Dysphagia 02/09/2010   Osteoarthritis 11/22/2009   Benign paroxysmal positional vertigo 05/15/2009   Essential hypertension 09/28/2008   SPINAL STENOSIS, LUMBAR 09/28/2008    PERNICIOUS ANEMIA 03/28/2008   Hypothyroidism 09/08/2007   HYPERLIPIDEMIA 09/08/2007   RESTLESS LEG SYNDROME 09/08/2007   HASHIMOTO'S THYROIDITIS 02/23/2007    Orientation RESPIRATION BLADDER Height & Weight     Self, Time, Situation, Place  O2 (2L) External catheter, Continent Weight: 142 lb (64.4 kg) Height:  5\' 4"  (162.6 cm)  BEHAVIORAL SYMPTOMS/MOOD NEUROLOGICAL BOWEL NUTRITION STATUS      Continent Feeding tube  AMBULATORY STATUS COMMUNICATION OF NEEDS Skin   Extensive Assist Verbally Other (Comment) (Sacrum medial stage 2)                       Personal Care Assistance Level of Assistance  Bathing, Feeding, Dressing Bathing Assistance: Limited assistance Feeding assistance: Independent Dressing Assistance: Limited assistance     Functional Limitations Info  Sight, Hearing, Speech Sight Info: Impaired Hearing Info: Impaired Speech Info: Adequate    SPECIAL CARE FACTORS FREQUENCY  PT (By licensed PT), OT (By licensed OT)     PT Frequency: 5 times weekly OT Frequency: 5 times weekly            Contractures Contractures Info: Not present    Additional Factors Info  Code Status, Allergies Code Status Info: FULL Allergies Info: Gabapentin, Other, Morphine           Current Medications (08/22/2023):  This is the current hospital active medication list Current Facility-Administered Medications  Medication Dose Route Frequency Provider Last Rate Last Admin   acetaminophen (TYLENOL) tablet 650 mg  650 mg Oral Q6H PRN Zierle-Ghosh, Asia B, DO       Or   acetaminophen (TYLENOL) suppository 650 mg  650 mg Rectal Q6H PRN Zierle-Ghosh, Asia B, DO       albuterol (PROVENTIL) (2.5 MG/3ML) 0.083% nebulizer solution 2.5 mg  2.5 mg Nebulization Q2H PRN Zierle-Ghosh, Asia B, DO       azithromycin (ZITHROMAX) 500 mg in sodium chloride 0.9 % 250 mL IVPB  500 mg Intravenous Q24H Zierle-Ghosh, Asia B, DO       cefTRIAXone (ROCEPHIN) 1 g in sodium chloride 0.9 % 100 mL  IVPB  1 g Intravenous Q24H Zierle-Ghosh, Asia B, DO       feeding supplement (JEVITY 1.2 CAL) liquid 1,000 mL  1,000 mL Oral Continuous Orson Aloe, RPH 50 mL/hr at 08/22/23 1501 1,000 mL at 08/22/23 1501   feeding supplement (PROSource TF20) liquid 60 mL  60 mL Per Tube Daily Vassie Loll, MD   60 mL at 08/22/23 1357   FLUoxetine (PROZAC) capsule 20 mg  20 mg Oral Daily Zierle-Ghosh, Asia B, DO   20 mg at 08/22/23 1049   free water 150 mL  150 mL Oral Q6H Orson Aloe, [facility]       Gerhardt's butt cream   Topical TID Vassie Loll, MD   Given at 08/22/23 1200   heparin injection 5,000 Units  5,000 Units Subcutaneous Q8H Zierle-Ghosh, Asia B, DO   5,000 Units at 08/22/23 1357   levothyroxine (SYNTHROID) tablet 112 mcg  112 mcg Oral Daily Zierle-Ghosh, Asia B, DO       loratadine (CLARITIN) tablet 10 mg  10 mg Oral Daily Zierle-Ghosh, Asia B, DO   10 mg at 08/22/23 1049   morphine (PF) 2 MG/ML injection 2 mg  2 mg Intravenous Q2H PRN Zierle-Ghosh, Asia B, DO       nystatin (MYCOSTATIN/NYSTOP) topical powder   Topical TID Vassie Loll, MD   Given at 08/22/23 1200   ondansetron (ZOFRAN) tablet 4 mg  4 mg Oral Q6H PRN Zierle-Ghosh, Asia B, DO       Or   ondansetron (ZOFRAN) injection 4 mg  4 mg Intravenous Q6H PRN Zierle-Ghosh, Asia B, DO   4 mg at 08/22/23 1610   oxyCODONE (Oxy IR/ROXICODONE) immediate release tablet 5 mg  5 mg Oral Q4H PRN Zierle-Ghosh, Asia B, DO       pantoprazole (PROTONIX) EC tablet 40 mg  40 mg Oral Daily Zierle-Ghosh, Asia B, DO         Discharge Medications: Please see discharge summary for a list of discharge medications.  Relevant Imaging Results:  Relevant Lab Results:   Additional Information SSN: [id number], [place]

## 2023-08-22 NOTE — Assessment & Plan Note (Signed)
continue prozac

## 2023-08-22 NOTE — Plan of Care (Signed)
Problem: Activity: Goal: Ability to tolerate increased activity will improve Outcome: Progressing   Problem: Clinical Measurements: Goal: Ability to maintain a body temperature in the normal range will improve Outcome: Progressing   Problem: Respiratory: Goal: Ability to maintain adequate ventilation will improve Outcome: Progressing Goal: Ability to maintain a clear airway will improve Outcome: Progressing   Problem: Education: Goal: Knowledge of General Education information will improve Description: Including pain rating scale, medication(s)/side effects and non-pharmacologic comfort measures Outcome: Progressing   Problem: Clinical Measurements: Goal: Ability to maintain clinical measurements within normal limits will improve Outcome: Progressing Goal: Will remain free from infection Outcome: Progressing Goal: Diagnostic test results will improve Outcome: Progressing Goal: Respiratory complications will improve Outcome: Progressing Goal: Cardiovascular complication will be avoided Outcome: Progressing

## 2023-08-22 NOTE — Evaluation (Signed)
Physical Therapy Evaluation Patient Details Name: Tasha Moss MRN: [id number] DOB: 06-07-1936 Today's Date: 08/22/2023  History of Present Illness  Tasha Moss is a 87 y.o. female with medical history significant of anxiety, GERD, hyperlipidemia, hypertension, hypothyroidism, and more presents ED with a chief complaint of dyspnea.  Patient reports that she has had dyspnea, coughing, weakness.  Her PCP could not see her so she is here now.  She reports this started 3 weeks ago when she was diagnosed with pneumonia after a chest x-ray was done.  She was started on antibiotic and she finished the full course.  Patient reports she has not been any better since then.  She has had a dry cough, no fever.  She reports no swelling in her legs.  She cannot lay flat, but has a difficult time knowing if it is due to breathing or if it is because of spinal issues that she has had chronically.  Patient reports that she has had some pain in her back and shoulders.  This started about 3 weeks ago as well.  She has been treated with Tylenol and that has helped.  Patient has no other complaints at this time.   Clinical Impression  Patient demonstrates slow labored movement for sitting up at bedside with most difficulty scooting to EOB due to BUE weakness, very unsteady on feet and limited to a few side steps at bedside before having to sit due to BLE weakness and c/o fatigue.  Patient tolerated sitting up in chair after therapy with her spouse present.  Patient will benefit from continued skilled physical therapy in hospital and recommended venue below to increase strength, balance, endurance for safe ADLs and gait.           If plan is discharge home, recommend the following: A lot of help with bathing/dressing/bathroom;A lot of help with walking and/or transfers;Help with stairs or ramp for entrance;Assistance with cooking/housework   Can travel by private vehicle   No    Equipment Recommendations None  recommended by PT  Recommendations for Other Services       Functional Status Assessment Patient has had a recent decline in their functional status and demonstrates the ability to make significant improvements in function in a reasonable and predictable amount of time.     Precautions / Restrictions Precautions Precautions: Fall Restrictions Weight Bearing Restrictions: No      Mobility  Bed Mobility Overal bed mobility: Needs Assistance Bed Mobility: Supine to Sit     Supine to sit: Mod assist, HOB elevated     General bed mobility comments: increased time, labored movement with most difficulty scooting to EOB    Transfers Overall transfer level: Needs assistance Equipment used: Rolling walker (2 wheels) Transfers: Sit to/from Stand, Bed to chair/wheelchair/BSC Sit to Stand: Min assist, Mod assist   Step pivot transfers: Mod assist       General transfer comment: slow labored movement with poor tolerance for standing due weakness and fatigue    Ambulation/Gait Ambulation/Gait assistance: Mod assist Gait Distance (Feet): 4 Feet Assistive device: Rolling walker (2 wheels) Gait Pattern/deviations: Decreased step length - right, Decreased step length - left, Decreased stride length Gait velocity: slow     General Gait Details: limited to a few steps at bedside before having to sit due to c/o fatigue and generalized weakness  Stairs            Wheelchair Mobility     Tilt Bed    Modified  Rankin (Stroke Patients Only)       Balance Overall balance assessment: Needs assistance Sitting-balance support: Feet supported, No upper extremity supported Sitting balance-Leahy Scale: Fair Sitting balance - Comments: fair/good seated at EOB   Standing balance support: Reliant on assistive device for balance, During functional activity, Bilateral upper extremity supported Standing balance-Leahy Scale: Poor Standing balance comment: fair/poor using RW                              Pertinent Vitals/Pain Pain Assessment Pain Assessment: No/denies pain    Home Living Family/patient expects to be discharged to:: Private residence Living Arrangements: Spouse/significant other Available Help at Discharge: Family;Available 24 hours/day Type of Home: House Home Access: Ramped entrance       Home Layout: Able to live on main level with bedroom/bathroom;Full bath on main level Home Equipment: Rolling Walker (2 wheels);Wheelchair - manual;BSC/3in1;Rollator (4 wheels);Cane - single point      Prior Function Prior Level of Function : Needs assist       Physical Assist : Mobility (physical);ADLs (physical) Mobility (physical): Bed mobility;Transfers;Gait;Stairs   Mobility Comments: household ambulator using RW ADLs Comments: Assisted by family     Extremity/Trunk Assessment   Upper Extremity Assessment Upper Extremity Assessment: Generalized weakness    Lower Extremity Assessment Lower Extremity Assessment: Generalized weakness    Cervical / Trunk Assessment Cervical / Trunk Assessment: Normal  Communication   Communication Communication: Hearing impairment  Cognition Arousal: Alert Behavior During Therapy: WFL for tasks assessed/performed Overall Cognitive Status: Within Functional Limits for tasks assessed                                          General Comments      Exercises     Assessment/Plan    PT Assessment Patient needs continued PT services  PT Problem List Decreased strength;Decreased activity tolerance;Decreased balance;Decreased mobility       PT Treatment Interventions DME instruction;Gait training;Stair training;Functional mobility training;Therapeutic activities;Therapeutic exercise;Balance training;Patient/family education    PT Goals (Current goals can be found in the Care Plan section)  Acute Rehab PT Goals Patient Stated Goal: return home after rehab PT Goal  Formulation: With patient/family Time For Goal Achievement: 09/05/23 Potential to Achieve Goals: Good    Frequency Min 3X/week     Co-evaluation               AM-PAC PT "6 Clicks" Mobility  Outcome Measure Help needed turning from your back to your side while in a flat bed without using bedrails?: A Little Help needed moving from lying on your back to sitting on the side of a flat bed without using bedrails?: A Lot Help needed moving to and from a bed to a chair (including a wheelchair)?: A Lot Help needed standing up from a chair using your arms (e.g., wheelchair or bedside chair)?: A Little Help needed to walk in hospital room?: A Lot Help needed climbing 3-5 steps with a railing? : A Lot 6 Click Score: 14    End of Session   Activity Tolerance: Patient tolerated treatment well;Patient limited by fatigue Patient left: in chair;with call bell/phone within reach;with family/visitor present Nurse Communication: Mobility status PT Visit Diagnosis: Unsteadiness on feet (R26.81);Other abnormalities of gait and mobility (R26.89);Muscle weakness (generalized) (M62.81)    Time: 5409-8119 PT Time Calculation (min) (ACUTE  ONLY): 32 min   Charges:   PT Evaluation $PT Eval Moderate Complexity: 1 Mod PT Treatments $Therapeutic Activity: 23-37 mins PT General Charges $$ ACUTE PT VISIT: 1 Visit         3:56 PM, 08/22/23 Ocie Bob, MPT Physical Therapist with [facility] [phone] office [phone] mobile phone

## 2023-08-22 NOTE — Progress Notes (Addendum)
Initial Nutrition Assessment  DOCUMENTATION CODES:   Non-severe (moderate) malnutrition in context of acute illness/injury  INTERVENTION:   Tube feeding via G-tube: Jevity 1.2 at 50 ml/h (1200 ml per day) Prosource TF20 60 ml once daily Free water flushes 150 ml QID Provides 1520 kcal, 87 gm protein, 1572 ml free water daily.  For home, recommend resume prior regimen: Jevity 1.5 4 cartons mixed with 200 ml water, run at 60 ml/h x 18 hours from 4pm to 10am.  Recommend add Prosource TF20 (or equivalent) 60 ml once daily at home to provide an additional 80 kcal and 20 gm protein per day.  NUTRITION DIAGNOSIS:   Moderate Malnutrition related to acute illness (PNA) as evidenced by mild fat depletion, mild muscle depletion, moderate muscle depletion.  GOAL:   Patient will meet greater than or equal to 90% of their needs  MONITOR:   TF tolerance  REASON FOR ASSESSMENT:   Malnutrition Screening Tool, New TF    ASSESSMENT:   87 yo female admitted with acute respiratory failure d/t PNA vs CHF. PMH includes anxiety, DJD, GERD, hypothyroidism s/p partial thyroidectomy, HLD, HTN, vertigo.  Spoke with patient and her husband at bedside. Patient's weight was down to 121 lbs (unsure when), then it climbed to 142 lbs, so MD decreased Jevity 1.5 TF from 5 cartons to 4 cartons per day. Typical home TF regimen is Jevity 1.5 4 cartons plus 200 ml water at 60 ml/h x 18 hours from 4PM to 10AM. This provides 1420 kcal, 60 gm protein, 920 ml free water daily. Patient takes minimal POs. She drinks approximately 4 ounces of thickened juice at home daily. She is able to eat ice chips as well. She is able to ambulate with a walker, but doesn't walk much. She has been very weak for the past 3 weeks d/t PNA and difficulty breathing.   TF is currently not running because Jevity 1.5 is ordered, but hospital does not have any Jevity 1.5. Will need to substitute Jevity 1.2.   Patient is currently on a heart  healthy diet and although she cannot eat/drink it, husband ate breakfast meal.   Labs reviewed.   Medications reviewed and include IV antibiotics.  NUTRITION - FOCUSED PHYSICAL EXAM:  Flowsheet Row Most Recent Value  Orbital Region Mild depletion  Upper Arm Region No depletion  Thoracic and Lumbar Region No depletion  Buccal Region Mild depletion  Temple Region Moderate depletion  Clavicle Bone Region Moderate depletion  Clavicle and Acromion Bone Region Mild depletion  Scapular Bone Region Mild depletion  Dorsal Hand Moderate depletion  Patellar Region No depletion  Anterior Thigh Region No depletion  Posterior Calf Region Mild depletion  Edema (RD Assessment) None  Hair Reviewed  Eyes Reviewed  Mouth Reviewed  Skin Reviewed  Nails Reviewed       Diet Order:   Diet Order             Diet Heart Room service appropriate? Yes; Fluid consistency: Thin  Diet effective now                   EDUCATION NEEDS:   No education needs have been identified at this time  Skin:  Skin Assessment: Reviewed RN Assessment  Last BM:  9/12  Height:   Ht Readings from Last 1 Encounters:  08/21/23 5\' 4"  (1.626 m)    Weight:   Wt Readings from Last 1 Encounters:  08/21/23 64.4 kg    Ideal Body Weight:  54.5 kg  BMI:  Body mass index is 24.37 kg/m.  Estimated Nutritional Needs:   Kcal:  1400-1600  Protein:  70-85 gm  Fluid:  >/= 1.5 L   Gabriel Rainwater RD, LDN, CNSC Please refer to Amion for contact information.

## 2023-08-22 NOTE — Progress Notes (Signed)
Patient seen and examined; admitted after midnight secondary to worsening shortness of breath, generalized weakness, hypoxia and concerns for pneumonia and vascular congestion.  About 3 weeks ago treated with antibiotics for suspected lung infiltrates with just mild improvement.  Vascular congestion/bronchitic changes appreciated on chest x-ray at time of admission.  Patient requiring 2 L nasal cannula supplementation (new for her).  Chronically ill in appearance, frail and deconditioned.  Please refer to H&P written by Dr.Zierle-ghosh for further info/details on admission.  Plan: -Follow rates clip measurements and 2D echo -Continue daily weights and strict I's and O's -Continue current antibiotics and close monitoring of patient volume status for further Lasix treatment. -follow clinical response. Weaned off O2 as tolerated.    Vassie Loll MD [phone]

## 2023-08-22 NOTE — Assessment & Plan Note (Signed)
continue PPI

## 2023-08-22 NOTE — Progress Notes (Signed)
Patient tolerating TF without difficulty , no further c/o nausea , or vomiting, as she was when first admitted to floor. Able to make needs known, attempted to wean to room air. Patient o2 dropped to 80, placed her back on 2L per minute and she came up to 96% .

## 2023-08-23 ENCOUNTER — Inpatient Hospital Stay (HOSPITAL_COMMUNITY): Payer: Medicare Other

## 2023-08-23 DIAGNOSIS — R531 Weakness: Secondary | ICD-10-CM | POA: Diagnosis not present

## 2023-08-23 DIAGNOSIS — J189 Pneumonia, unspecified organism: Secondary | ICD-10-CM

## 2023-08-23 DIAGNOSIS — R0609 Other forms of dyspnea: Secondary | ICD-10-CM | POA: Diagnosis not present

## 2023-08-23 DIAGNOSIS — F418 Other specified anxiety disorders: Secondary | ICD-10-CM | POA: Diagnosis not present

## 2023-08-23 DIAGNOSIS — I5033 Acute on chronic diastolic (congestive) heart failure: Secondary | ICD-10-CM

## 2023-08-23 DIAGNOSIS — J9601 Acute respiratory failure with hypoxia: Secondary | ICD-10-CM | POA: Diagnosis not present

## 2023-08-23 DIAGNOSIS — R6339 Other feeding difficulties: Secondary | ICD-10-CM | POA: Diagnosis not present

## 2023-08-23 LAB — ECHOCARDIOGRAM COMPLETE
AR max vel: 2.78 cm2
AV Area VTI: 2.81 cm2
AV Area mean vel: 2.59 cm2
AV Mean grad: 4 mmHg
AV Peak grad: 7 mmHg
Ao pk vel: 1.32 m/s
Area-P 1/2: 3.72 cm2
Height: 64 in
MV VTI: 3.99 cm2
S' Lateral: 2.1 cm
Weight: 2272 [oz_av]

## 2023-08-23 MED ORDER — FUROSEMIDE 10 MG/ML IJ SOLN
40.0000 mg | Freq: Once | INTRAMUSCULAR | Status: AC
  Administered 2023-08-23: 40 mg via INTRAVENOUS
  Filled 2023-08-23: qty 4

## 2023-08-23 NOTE — Progress Notes (Signed)
*  PRELIMINARY RESULTS* Echocardiogram 2D Echocardiogram has been performed.  Tasha Moss 08/23/2023, 1:54 PM

## 2023-08-23 NOTE — Progress Notes (Signed)
Progress Note   Patient: Tasha Moss ZOX:[id number] DOB: 1936-06-23 DOA: 08/21/2023     1 DOS: the patient was seen and examined on 08/23/2023   Brief hospital admission narrative: As per H&P written by Dr.Zierle-Ghosh on 08/22/23 [name] is a 87 y.o. female with medical history significant of anxiety, GERD, hyperlipidemia, hypertension, hypothyroidism, and more presents ED with a chief complaint of dyspnea.  Patient reports that she has had dyspnea, coughing, weakness.  Her PCP could not see her so she is here now.  She reports this started 3 weeks ago when she was diagnosed with pneumonia after a chest x-ray was done.  She was started on antibiotic and she finished the full course.  Patient reports she has not been any better since then.  She has had a dry cough, no fever.  She reports no swelling in her legs.  She cannot lay flat, but has a difficult time knowing if it is due to breathing or if it is because of spinal issues that she has had chronically.  Patient reports that she has had some pain in her back and shoulders.  This started about 3 weeks ago as well.  She has been treated with Tylenol and that has helped.  Patient has no other complaints at this time.    Assessment and Plan: * Acute respiratory failure with hypoxia (HCC) - COVID-negative -Troponin 15>> 17 and no signs of ischemic changes -2D echo demonstrating grade 1 diastolic dysfunction with preserved ejection fraction and no significant valvular disorder. -Patient at high risk for pneumonia process -Continue flutter valve and current antibiotics -Continue daily assessment of her volume status with continuation of diuretics -Follow daily weights, strict I's and O's and Reds clip measurement.  Feeding problems -In the setting of dysphagia -Continue tube feedings -Patient at high risk for silent aspiration.  Depression with anxiety -No suicidal ideation or hallucination -Continue prozac -Continue constant  reorientation  GERD (gastroesophageal reflux disease) -continue PPI  Hypothyroidism -Continue Synthroid.  Moderate protein calorie malnutrition -Continue tube feedings and feeding supplements -Body mass index is 24.37 kg/m.  Intertrigo/stage II pressure injury -Intertrigo changes around PEG tube entrance point -Stage II sacral pressure injury present at time of admission without superimposed infection -Continue topical nystatin, preventive measurements and constant repositioning.  Physical deconditioning -Patient has been assessed by physical therapy with recommendation for skilled nursing facility in order to pursued rehabilitation and conditioning.   Subjective:  2 L nasal cannula in place; expressing feeling better and noticing some increasing urine output.  No chest pain, no nausea, no vomiting, no abdominal pain.  Patient is afebrile.  Physical Exam: Vitals:   08/22/23 1403 08/22/23 1700 08/22/23 2156 08/23/23 0440  BP: (!) 114/58 111/67 118/63 121/74  Pulse: 78 88 82 78  Resp: 18 18  18   Temp: 98.2 F (36.8 C) 98.5 F (36.9 C) 97.8 F (36.6 C) 98.3 F (36.8 C)  TempSrc: Oral Oral Oral Oral  SpO2: 96% 95% 93% 98%  Weight:      Height:       General exam: Alert, awake, oriented x 3; expressing feeling a little bit better.  Still short winded, intermittently lethargic according to family members requiring 2 L nasal cannula supplementation.  No fever. Respiratory system: Decreased breath sounds at the bases; positive rhonchi.  No using accessory muscle. Cardiovascular system: Rate controlled, no rubs, no gallops, no JVD on exam. Gastrointestinal system: Abdomen is nondistended, soft and with positive bowel sounds.  G-tube in  place; slight intertrigo changes around PEG tube entrance. Central nervous system: Generally weak; moving 4 limbs.  No focal neurological deficits. Extremities: No cyanosis or clubbing; trace edema appreciated bilaterally. Skin: No petechiae.   Stage II sacral pressure injury present at time of admission without signs of superimposed infection. Psychiatry: Judgement and insight appear normal. Mood & affect appropriate.   Data Reviewed: Comprehensive metabolic panel: Sodium 139, potassium 3.9, chloride 101, bicarb 31, BUN 20, creatinine 0.44, normal LFTs, GFR >60 Magnesium: 1.9 Procalcitonin: <0.10  Family Communication: Daughter updated over the phone; and her husband at bedside.  Disposition: Status is: Inpatient Remains inpatient appropriate because: Continue IV antibiotics and IV diuresis.   Planned Discharge Destination: Skilled nursing facility   Time spent: 45 minutes  Author: Vassie Loll, MD 08/23/2023 6:12 PM  For on call review [email or web address].

## 2023-08-24 DIAGNOSIS — F418 Other specified anxiety disorders: Secondary | ICD-10-CM | POA: Diagnosis not present

## 2023-08-24 DIAGNOSIS — J9601 Acute respiratory failure with hypoxia: Secondary | ICD-10-CM | POA: Diagnosis not present

## 2023-08-24 DIAGNOSIS — R531 Weakness: Secondary | ICD-10-CM | POA: Diagnosis not present

## 2023-08-24 DIAGNOSIS — R6339 Other feeding difficulties: Secondary | ICD-10-CM | POA: Diagnosis not present

## 2023-08-24 LAB — BASIC METABOLIC PANEL
Anion gap: 9 (ref 5–15)
BUN: 26 mg/dL — ABNORMAL HIGH (ref 8–23)
CO2: 35 mmol/L — ABNORMAL HIGH (ref 22–32)
Calcium: 8.3 mg/dL — ABNORMAL LOW (ref 8.9–10.3)
Chloride: 91 mmol/L — ABNORMAL LOW (ref 98–111)
Creatinine, Ser: 0.54 mg/dL (ref 0.44–1.00)
GFR, Estimated: >60 mL/min (ref >60)
Glucose, Bld: 156 mg/dL — ABNORMAL HIGH (ref 70–99)
Potassium: 3.6 mmol/L (ref 3.5–5.1)
Sodium: 135 mmol/L (ref 135–145)

## 2023-08-24 MED ORDER — AMOXICILLIN-POT CLAVULANATE 600-42.9 MG/5ML PO SUSR
875.0000 mg | Freq: Two times a day (BID) | ORAL | Status: DC
  Administered 2023-08-24 – 2023-08-25 (×2): 875 mg
  Filled 2023-08-24 (×6): qty 7.3

## 2023-08-24 MED ORDER — FUROSEMIDE 20 MG PO TABS
20.0000 mg | ORAL_TABLET | ORAL | Status: DC
  Administered 2023-08-24: 20 mg via ORAL
  Filled 2023-08-24: qty 1

## 2023-08-24 NOTE — Progress Notes (Signed)
Progress Note   Patient: Tasha Moss:[id number] DOB: [birth year] DOA: 08/21/2023     2 DOS: the patient was seen and examined on 08/24/2023   Brief hospital admission narrative: As per H&P written by Dr.Zierle-Ghosh on 08/22/23 Tasha Moss is a 87 y.o. female with medical history significant of anxiety, GERD, hyperlipidemia, hypertension, hypothyroidism, and more presents ED with a chief complaint of dyspnea.  Patient reports that she has had dyspnea, coughing, weakness.  Her PCP could not see her so she is here now.  She reports this started 3 weeks ago when she was diagnosed with pneumonia after a chest x-ray was done.  She was started on antibiotic and she finished the full course.  Patient reports she has not been any better since then.  She has had a dry cough, no fever.  She reports no swelling in her legs.  She cannot lay flat, but has a difficult time knowing if it is due to breathing or if it is because of spinal issues that she has had chronically.  Patient reports that she has had some pain in her back and shoulders.  This started about 3 weeks ago as well.  She has been treated with Tylenol and that has helped.  Patient has no other complaints at this time.    Assessment and Plan: * Acute respiratory failure with hypoxia (HCC) - COVID-negative -Troponin 15>> 17 and no signs of ischemic changes -2D echo demonstrating grade 1 diastolic dysfunction with preserved ejection fraction and no significant valvular disorder. -Patient at high risk for pneumonia process -Lasix will be transition to 20 mg suspension every other day. -Continue flutter valve and complete antibiotics using Augmentin. -Continue to follow close assessment of patient's volume status. -Follow daily weights, strict I's and O's and repeat Reds clip measurement in a.m.  Feeding problems -In the setting of dysphagia -Continue tube feedings. -Patient at high risk for silent aspiration.  Depression with  anxiety -No suicidal ideation or hallucination -Continue prozac -Continue constant reorientation and supportive care. -Flat affect appreciated on exam.  GERD (gastroesophageal reflux disease) -continue PPI -30 degree head of bed elevation to minimize reflux/silent aspiration.  Hypothyroidism -Continue Synthroid.  Moderate protein calorie malnutrition -Continue tube feedings and feeding supplements -Body mass index is 24.37 kg/m.  Intertrigo/stage II pressure injury -Intertrigo changes around PEG tube entrance point -Stage II sacral pressure injury present at time of admission without superimposed infection -Continue topical nystatin, preventive measurements and constant repositioning.  Physical deconditioning -Patient has been assessed by physical therapy with recommendation for skilled nursing facility in order to pursued rehabilitation and conditioning. -Anticipate medical readiness on 08/25/2023 for discharge to skilled nursing facility.   Subjective:  2 L nasal cannula in place; no chest pain, no nausea, no vomiting, no abdominal pain.  Patient is afebrile.  Generally weak and deconditioned.  Physical Exam: Vitals:   08/23/23 0440 08/23/23 1925 08/23/23 2045 08/24/23 0510  BP: 121/74 (!) 134/58 (!) 135/59 (!) 122/56  Pulse: 78 81 95 91  Resp: 18 19 17 20   Temp: 98.3 F (36.8 C) 98.3 F (36.8 C)  97.9 F (36.6 C)  TempSrc: Oral Oral  Oral  SpO2: 98% 93% 93% 93%  Weight:      Height:       General exam: Alert, awake, oriented x 3; feeling weak and tired; no fever. Respiratory system: Decreased breath sounds in the bases; positive rhonchi.  No using accessory muscles.  O2 sat is stable with 2 L  supplementation. Cardiovascular system: Rate controlled, no rubs, no gallops, no JVD. Gastrointestinal system: Abdomen is nondistended, soft and nontender.  PEG tube in place.  Positive bowel sounds. Central nervous system: Moving 4 limbs spontaneously.  No focal neurological  deficits. Extremities: No cyanosis, clubbing or edema. Skin: No petechiae.;  Stage II sacral pressure injury present at time of admission without signs of superimposed infection. Psychiatry: Judgement and insight appear normal. Mood & affect appropriate.    Data Reviewed: Basic metabolic panel: Sodium 135, potassium 3.6, chloride 91, bicarb 35, BUN 26, creatinine 0.54 Magnesium: 1.9 Procalcitonin: <0.10  Family Communication: Daughter updated over the phone; and her husband at bedside.  Disposition: Status is: Inpatient Remains inpatient appropriate because: Continue IV antibiotics and IV diuresis.   Planned Discharge Destination: Skilled nursing facility    Time spent: 45 minutes  Author: Vassie Loll, MD 08/24/2023 3:56 PM  For on call review [email or web address].

## 2023-08-24 NOTE — Progress Notes (Signed)
Attempted to wean patient off o2. Took pt off o2 for 20 minutes and o2 sats were 83%. N/c put back on patient and educated pt on the spirometry.

## 2023-08-25 DIAGNOSIS — H8103 Meniere's disease, bilateral: Secondary | ICD-10-CM | POA: Diagnosis not present

## 2023-08-25 DIAGNOSIS — J9611 Chronic respiratory failure with hypoxia: Secondary | ICD-10-CM | POA: Diagnosis not present

## 2023-08-25 DIAGNOSIS — H8110 Benign paroxysmal vertigo, unspecified ear: Secondary | ICD-10-CM | POA: Diagnosis not present

## 2023-08-25 DIAGNOSIS — R262 Difficulty in walking, not elsewhere classified: Secondary | ICD-10-CM | POA: Diagnosis not present

## 2023-08-25 DIAGNOSIS — Q791 Other congenital malformations of diaphragm: Secondary | ICD-10-CM | POA: Diagnosis not present

## 2023-08-25 DIAGNOSIS — R42 Dizziness and giddiness: Secondary | ICD-10-CM | POA: Diagnosis not present

## 2023-08-25 DIAGNOSIS — I700 Atherosclerosis of aorta: Secondary | ICD-10-CM | POA: Diagnosis not present

## 2023-08-25 DIAGNOSIS — E876 Hypokalemia: Secondary | ICD-10-CM | POA: Diagnosis not present

## 2023-08-25 DIAGNOSIS — Z96643 Presence of artificial hip joint, bilateral: Secondary | ICD-10-CM | POA: Diagnosis not present

## 2023-08-25 DIAGNOSIS — R6339 Other feeding difficulties: Secondary | ICD-10-CM | POA: Diagnosis not present

## 2023-08-25 DIAGNOSIS — I5032 Chronic diastolic (congestive) heart failure: Secondary | ICD-10-CM | POA: Diagnosis not present

## 2023-08-25 DIAGNOSIS — R627 Adult failure to thrive: Secondary | ICD-10-CM | POA: Diagnosis not present

## 2023-08-25 DIAGNOSIS — R531 Weakness: Secondary | ICD-10-CM | POA: Diagnosis not present

## 2023-08-25 DIAGNOSIS — L308 Other specified dermatitis: Secondary | ICD-10-CM | POA: Diagnosis not present

## 2023-08-25 DIAGNOSIS — R195 Other fecal abnormalities: Secondary | ICD-10-CM | POA: Diagnosis not present

## 2023-08-25 DIAGNOSIS — F418 Other specified anxiety disorders: Secondary | ICD-10-CM | POA: Diagnosis not present

## 2023-08-25 DIAGNOSIS — K222 Esophageal obstruction: Secondary | ICD-10-CM | POA: Diagnosis not present

## 2023-08-25 DIAGNOSIS — K219 Gastro-esophageal reflux disease without esophagitis: Secondary | ICD-10-CM | POA: Diagnosis not present

## 2023-08-25 DIAGNOSIS — Z741 Need for assistance with personal care: Secondary | ICD-10-CM | POA: Diagnosis not present

## 2023-08-25 DIAGNOSIS — Z931 Gastrostomy status: Secondary | ICD-10-CM | POA: Diagnosis not present

## 2023-08-25 DIAGNOSIS — M6281 Muscle weakness (generalized): Secondary | ICD-10-CM | POA: Diagnosis not present

## 2023-08-25 DIAGNOSIS — L602 Onychogryphosis: Secondary | ICD-10-CM | POA: Diagnosis not present

## 2023-08-25 DIAGNOSIS — M159 Polyosteoarthritis, unspecified: Secondary | ICD-10-CM | POA: Diagnosis not present

## 2023-08-25 DIAGNOSIS — J309 Allergic rhinitis, unspecified: Secondary | ICD-10-CM | POA: Diagnosis not present

## 2023-08-25 DIAGNOSIS — I5031 Acute diastolic (congestive) heart failure: Secondary | ICD-10-CM | POA: Diagnosis not present

## 2023-08-25 DIAGNOSIS — E870 Hyperosmolality and hypernatremia: Secondary | ICD-10-CM | POA: Diagnosis not present

## 2023-08-25 DIAGNOSIS — R488 Other symbolic dysfunctions: Secondary | ICD-10-CM | POA: Diagnosis not present

## 2023-08-25 DIAGNOSIS — J3089 Other allergic rhinitis: Secondary | ICD-10-CM | POA: Diagnosis not present

## 2023-08-25 DIAGNOSIS — J9601 Acute respiratory failure with hypoxia: Secondary | ICD-10-CM | POA: Diagnosis not present

## 2023-08-25 DIAGNOSIS — M48061 Spinal stenosis, lumbar region without neurogenic claudication: Secondary | ICD-10-CM | POA: Diagnosis not present

## 2023-08-25 DIAGNOSIS — E890 Postprocedural hypothyroidism: Secondary | ICD-10-CM | POA: Diagnosis not present

## 2023-08-25 DIAGNOSIS — R1312 Dysphagia, oropharyngeal phase: Secondary | ICD-10-CM | POA: Diagnosis not present

## 2023-08-25 DIAGNOSIS — L603 Nail dystrophy: Secondary | ICD-10-CM | POA: Diagnosis not present

## 2023-08-25 DIAGNOSIS — Z96612 Presence of left artificial shoulder joint: Secondary | ICD-10-CM | POA: Diagnosis not present

## 2023-08-25 DIAGNOSIS — R1310 Dysphagia, unspecified: Secondary | ICD-10-CM | POA: Diagnosis not present

## 2023-08-25 DIAGNOSIS — I509 Heart failure, unspecified: Secondary | ICD-10-CM | POA: Diagnosis not present

## 2023-08-25 DIAGNOSIS — I10 Essential (primary) hypertension: Secondary | ICD-10-CM | POA: Diagnosis not present

## 2023-08-25 DIAGNOSIS — R600 Localized edema: Secondary | ICD-10-CM | POA: Diagnosis not present

## 2023-08-25 DIAGNOSIS — G2581 Restless legs syndrome: Secondary | ICD-10-CM | POA: Diagnosis not present

## 2023-08-25 DIAGNOSIS — Z961 Presence of intraocular lens: Secondary | ICD-10-CM | POA: Diagnosis not present

## 2023-08-25 DIAGNOSIS — E063 Autoimmune thyroiditis: Secondary | ICD-10-CM | POA: Diagnosis not present

## 2023-08-25 DIAGNOSIS — R0602 Shortness of breath: Secondary | ICD-10-CM | POA: Diagnosis not present

## 2023-08-25 DIAGNOSIS — I5033 Acute on chronic diastolic (congestive) heart failure: Secondary | ICD-10-CM | POA: Diagnosis not present

## 2023-08-25 DIAGNOSIS — E039 Hypothyroidism, unspecified: Secondary | ICD-10-CM | POA: Diagnosis not present

## 2023-08-25 DIAGNOSIS — J9811 Atelectasis: Secondary | ICD-10-CM | POA: Diagnosis not present

## 2023-08-25 DIAGNOSIS — Z978 Presence of other specified devices: Secondary | ICD-10-CM | POA: Diagnosis not present

## 2023-08-25 DIAGNOSIS — E785 Hyperlipidemia, unspecified: Secondary | ICD-10-CM | POA: Diagnosis not present

## 2023-08-25 DIAGNOSIS — L84 Corns and callosities: Secondary | ICD-10-CM | POA: Diagnosis not present

## 2023-08-25 DIAGNOSIS — Z8616 Personal history of COVID-19: Secondary | ICD-10-CM | POA: Diagnosis not present

## 2023-08-25 DIAGNOSIS — I739 Peripheral vascular disease, unspecified: Secondary | ICD-10-CM | POA: Diagnosis not present

## 2023-08-25 DIAGNOSIS — I517 Cardiomegaly: Secondary | ICD-10-CM | POA: Diagnosis not present

## 2023-08-25 DIAGNOSIS — E440 Moderate protein-calorie malnutrition: Secondary | ICD-10-CM | POA: Diagnosis not present

## 2023-08-25 MED ORDER — PROSOURCE TF20 ENFIT COMPATIBL EN LIQD: 60.0000 mL | Freq: Two times a day (BID) | ENTERAL | Status: DC

## 2023-08-25 MED ORDER — AMOXICILLIN-POT CLAVULANATE 600-42.9 MG/5ML PO SUSR: 875.0000 mg | Freq: Two times a day (BID) | ORAL | Status: AC

## 2023-08-25 MED ORDER — FREE WATER: 150.0000 mL | Freq: Every day | Status: DC

## 2023-08-25 MED ORDER — FUROSEMIDE 20 MG PO TABS: 20.0000 mg | ORAL_TABLET | ORAL | Status: DC

## 2023-08-25 MED ORDER — GERHARDT'S BUTT CREAM: 1.0000 | TOPICAL_CREAM | Freq: Three times a day (TID) | CUTANEOUS | Status: DC

## 2023-08-25 NOTE — Consult Note (Signed)
[facility] Customer service manager [facility]) Accountable Care Organization (ACO) [facility] Liaison Note  08/25/2023  XAN TORNABENE 1936-10-16 [id number]  Location: [facility] RN Hospital Liaison screened the patient remotely at [facility].  Insurance: [employer]   CEDRA ETSITTY is a 87 y.o. female who is a Primary Care Patient of [place], Helane Rima, MD [place] [facility]. The patient was screened for readmission hospitalization with noted low risk score for unplanned readmission risk with 1 IP in 6 months.  The patient was assessed for potential [facility]) Care Management service needs for post hospital transition for care coordination. Review of patient's electronic medical record reveals patient was admitted for Acute respiratory failure with hypoxia.  Pt will discharge to a  SNF level of care for ongoing STR. Facility will continue to manage pt's need. Liaison will collaborate with PAC-RN concerning pt's discharge disposition.   [facility] Care Management/Population Health does not replace or interfere with any arrangements made by the Inpatient Transition of Care team.   For questions contact:   Elliot Cousin, RN, [facility] Liaison [place]   Population Health Office Hours MTWF  8:00 am-6:00 pm [phone] mobile [phone] [Office toll free line] Office Hours are M-F 8:30 - 5 pm [email or web address]

## 2023-08-25 NOTE — Discharge Summary (Signed)
Physician Discharge Summary   Patient: Tasha Moss MRN: [id number] DOB: 05-23-1936  Admit date:     08/21/2023  Discharge date: 08/25/23  Discharge Physician: Vassie Loll   PCP: Sheliah Hatch, MD   Recommendations at discharge:  Repeat basic metabolic panel in 5 days to follow ultralights and renal function Continue weaning off oxygen supplementation as tolerated Follow daily weights, strict I's and O's, and response to diuretics.  Discharge Diagnoses: Principal Problem:   Acute respiratory failure with hypoxia (HCC) Active Problems:   Hypothyroidism   GERD (gastroesophageal reflux disease)   Depression with anxiety   Feeding problems  Brief hospital admission narrative: As per H&P written by Dr.Zierle-Ghosh on 08/22/23 Tasha Moss is a 87 y.o. female with medical history significant of anxiety, GERD, hyperlipidemia, hypertension, hypothyroidism, and more presents ED with a chief complaint of dyspnea.  Patient reports that she has had dyspnea, coughing, weakness.  Her PCP could not see her so she is here now.  She reports this started 3 weeks ago when she was diagnosed with pneumonia after a chest x-ray was done.  She was started on antibiotic and she finished the full course.  Patient reports she has not been any better since then.  She has had a dry cough, no fever.  She reports no swelling in her legs.  She cannot lay flat, but has a difficult time knowing if it is due to breathing or if it is because of spinal issues that she has had chronically.  Patient reports that she has had some pain in her back and shoulders.  This started about 3 weeks ago as well.  She has been treated with Tylenol and that has helped.  Patient has no other complaints at this time.   Assessment and Plan:  Acute respiratory failure with hypoxia (HCC) - COVID-negative -Troponin 15>> 17 and no signs of ischemic changes -2D echo demonstrating grade 1 diastolic dysfunction with preserved ejection  fraction and no significant valvular disorder. -Patient at high risk for pneumonia process/aspiration. -Continue the use of Lasix 20 mg every other day and follow-up patient's volume status. -Continue flutter valve and complete antibiotics using Augmentin. -Continue to follow close assessment of patient's volume status. -Continue follow daily weights and maintain adequate hydration. -Continue to wean off oxygen supplementation as tolerated (2 L nasal cannula in place with good saturation at discharge).   Feeding problems -In the setting of dysphagia -Continue tube feedings. -Patient at high risk for silent aspiration.   Depression with anxiety -No suicidal ideation or hallucination -Continue prozac -Continue constant reorientation and supportive care. -Flat affect appreciated on exam.   GERD (gastroesophageal reflux disease) -continue PPI -30 degree head of bed elevation to minimize reflux/silent aspiration.   Hypothyroidism -Continue Synthroid.   Moderate protein calorie malnutrition -Continue tube feedings and feeding supplements -Body mass index is 24.37 kg/m.   Intertrigo/stage II pressure injury -Intertrigo changes around PEG tube entrance point -Stage II sacral pressure injury present at time of admission without superimposed infection -Continue topical nystatin, preventive measurements and constant repositioning.   Physical deconditioning -Patient has been assessed by physical therapy with recommendation for skilled nursing facility in order to pursued rehabilitation and conditioning. -Anticipate medical readiness on 08/25/2023 for discharge to skilled nursing facility.  Consultants: Nutritional service. Procedures performed: 2D echo; see below for x-ray reports. Disposition: Skilled nursing facility.  (Short-term rehab). Diet recommendation: Main nutrition to be provided through PEG tube.  DISCHARGE MEDICATION: Allergies as of 08/25/2023  Physician Discharge Summary   Patient: Tasha Moss MRN: [id number] DOB: 05-23-1936  Admit date:     08/21/2023  Discharge date: 08/25/23  Discharge Physician: Vassie Loll   PCP: Sheliah Hatch, MD   Recommendations at discharge:  Repeat basic metabolic panel in 5 days to follow ultralights and renal function Continue weaning off oxygen supplementation as tolerated Follow daily weights, strict I's and O's, and response to diuretics.  Discharge Diagnoses: Principal Problem:   Acute respiratory failure with hypoxia (HCC) Active Problems:   Hypothyroidism   GERD (gastroesophageal reflux disease)   Depression with anxiety   Feeding problems  Brief hospital admission narrative: As per H&P written by Dr.Zierle-Ghosh on 08/22/23 Tasha Moss is a 87 y.o. female with medical history significant of anxiety, GERD, hyperlipidemia, hypertension, hypothyroidism, and more presents ED with a chief complaint of dyspnea.  Patient reports that she has had dyspnea, coughing, weakness.  Her PCP could not see her so she is here now.  She reports this started 3 weeks ago when she was diagnosed with pneumonia after a chest x-ray was done.  She was started on antibiotic and she finished the full course.  Patient reports she has not been any better since then.  She has had a dry cough, no fever.  She reports no swelling in her legs.  She cannot lay flat, but has a difficult time knowing if it is due to breathing or if it is because of spinal issues that she has had chronically.  Patient reports that she has had some pain in her back and shoulders.  This started about 3 weeks ago as well.  She has been treated with Tylenol and that has helped.  Patient has no other complaints at this time.   Assessment and Plan:  Acute respiratory failure with hypoxia (HCC) - COVID-negative -Troponin 15>> 17 and no signs of ischemic changes -2D echo demonstrating grade 1 diastolic dysfunction with preserved ejection  fraction and no significant valvular disorder. -Patient at high risk for pneumonia process/aspiration. -Continue the use of Lasix 20 mg every other day and follow-up patient's volume status. -Continue flutter valve and complete antibiotics using Augmentin. -Continue to follow close assessment of patient's volume status. -Continue follow daily weights and maintain adequate hydration. -Continue to wean off oxygen supplementation as tolerated (2 L nasal cannula in place with good saturation at discharge).   Feeding problems -In the setting of dysphagia -Continue tube feedings. -Patient at high risk for silent aspiration.   Depression with anxiety -No suicidal ideation or hallucination -Continue prozac -Continue constant reorientation and supportive care. -Flat affect appreciated on exam.   GERD (gastroesophageal reflux disease) -continue PPI -30 degree head of bed elevation to minimize reflux/silent aspiration.   Hypothyroidism -Continue Synthroid.   Moderate protein calorie malnutrition -Continue tube feedings and feeding supplements -Body mass index is 24.37 kg/m.   Intertrigo/stage II pressure injury -Intertrigo changes around PEG tube entrance point -Stage II sacral pressure injury present at time of admission without superimposed infection -Continue topical nystatin, preventive measurements and constant repositioning.   Physical deconditioning -Patient has been assessed by physical therapy with recommendation for skilled nursing facility in order to pursued rehabilitation and conditioning. -Anticipate medical readiness on 08/25/2023 for discharge to skilled nursing facility.  Consultants: Nutritional service. Procedures performed: 2D echo; see below for x-ray reports. Disposition: Skilled nursing facility.  (Short-term rehab). Diet recommendation: Main nutrition to be provided through PEG tube.  DISCHARGE MEDICATION: Allergies as of 08/25/2023  Physician Discharge Summary   Patient: Tasha Moss MRN: [id number] DOB: 05-23-1936  Admit date:     08/21/2023  Discharge date: 08/25/23  Discharge Physician: Vassie Loll   PCP: Sheliah Hatch, MD   Recommendations at discharge:  Repeat basic metabolic panel in 5 days to follow ultralights and renal function Continue weaning off oxygen supplementation as tolerated Follow daily weights, strict I's and O's, and response to diuretics.  Discharge Diagnoses: Principal Problem:   Acute respiratory failure with hypoxia (HCC) Active Problems:   Hypothyroidism   GERD (gastroesophageal reflux disease)   Depression with anxiety   Feeding problems  Brief hospital admission narrative: As per H&P written by Dr.Zierle-Ghosh on 08/22/23 Tasha Moss is a 87 y.o. female with medical history significant of anxiety, GERD, hyperlipidemia, hypertension, hypothyroidism, and more presents ED with a chief complaint of dyspnea.  Patient reports that she has had dyspnea, coughing, weakness.  Her PCP could not see her so she is here now.  She reports this started 3 weeks ago when she was diagnosed with pneumonia after a chest x-ray was done.  She was started on antibiotic and she finished the full course.  Patient reports she has not been any better since then.  She has had a dry cough, no fever.  She reports no swelling in her legs.  She cannot lay flat, but has a difficult time knowing if it is due to breathing or if it is because of spinal issues that she has had chronically.  Patient reports that she has had some pain in her back and shoulders.  This started about 3 weeks ago as well.  She has been treated with Tylenol and that has helped.  Patient has no other complaints at this time.   Assessment and Plan:  Acute respiratory failure with hypoxia (HCC) - COVID-negative -Troponin 15>> 17 and no signs of ischemic changes -2D echo demonstrating grade 1 diastolic dysfunction with preserved ejection  fraction and no significant valvular disorder. -Patient at high risk for pneumonia process/aspiration. -Continue the use of Lasix 20 mg every other day and follow-up patient's volume status. -Continue flutter valve and complete antibiotics using Augmentin. -Continue to follow close assessment of patient's volume status. -Continue follow daily weights and maintain adequate hydration. -Continue to wean off oxygen supplementation as tolerated (2 L nasal cannula in place with good saturation at discharge).   Feeding problems -In the setting of dysphagia -Continue tube feedings. -Patient at high risk for silent aspiration.   Depression with anxiety -No suicidal ideation or hallucination -Continue prozac -Continue constant reorientation and supportive care. -Flat affect appreciated on exam.   GERD (gastroesophageal reflux disease) -continue PPI -30 degree head of bed elevation to minimize reflux/silent aspiration.   Hypothyroidism -Continue Synthroid.   Moderate protein calorie malnutrition -Continue tube feedings and feeding supplements -Body mass index is 24.37 kg/m.   Intertrigo/stage II pressure injury -Intertrigo changes around PEG tube entrance point -Stage II sacral pressure injury present at time of admission without superimposed infection -Continue topical nystatin, preventive measurements and constant repositioning.   Physical deconditioning -Patient has been assessed by physical therapy with recommendation for skilled nursing facility in order to pursued rehabilitation and conditioning. -Anticipate medical readiness on 08/25/2023 for discharge to skilled nursing facility.  Consultants: Nutritional service. Procedures performed: 2D echo; see below for x-ray reports. Disposition: Skilled nursing facility.  (Short-term rehab). Diet recommendation: Main nutrition to be provided through PEG tube.  DISCHARGE MEDICATION: Allergies as of 08/25/2023  Physician Discharge Summary   Patient: Tasha Moss MRN: [id number] DOB: 05-23-1936  Admit date:     08/21/2023  Discharge date: 08/25/23  Discharge Physician: Vassie Loll   PCP: Sheliah Hatch, MD   Recommendations at discharge:  Repeat basic metabolic panel in 5 days to follow ultralights and renal function Continue weaning off oxygen supplementation as tolerated Follow daily weights, strict I's and O's, and response to diuretics.  Discharge Diagnoses: Principal Problem:   Acute respiratory failure with hypoxia (HCC) Active Problems:   Hypothyroidism   GERD (gastroesophageal reflux disease)   Depression with anxiety   Feeding problems  Brief hospital admission narrative: As per H&P written by Dr.Zierle-Ghosh on 08/22/23 Tasha Moss is a 87 y.o. female with medical history significant of anxiety, GERD, hyperlipidemia, hypertension, hypothyroidism, and more presents ED with a chief complaint of dyspnea.  Patient reports that she has had dyspnea, coughing, weakness.  Her PCP could not see her so she is here now.  She reports this started 3 weeks ago when she was diagnosed with pneumonia after a chest x-ray was done.  She was started on antibiotic and she finished the full course.  Patient reports she has not been any better since then.  She has had a dry cough, no fever.  She reports no swelling in her legs.  She cannot lay flat, but has a difficult time knowing if it is due to breathing or if it is because of spinal issues that she has had chronically.  Patient reports that she has had some pain in her back and shoulders.  This started about 3 weeks ago as well.  She has been treated with Tylenol and that has helped.  Patient has no other complaints at this time.   Assessment and Plan:  Acute respiratory failure with hypoxia (HCC) - COVID-negative -Troponin 15>> 17 and no signs of ischemic changes -2D echo demonstrating grade 1 diastolic dysfunction with preserved ejection  fraction and no significant valvular disorder. -Patient at high risk for pneumonia process/aspiration. -Continue the use of Lasix 20 mg every other day and follow-up patient's volume status. -Continue flutter valve and complete antibiotics using Augmentin. -Continue to follow close assessment of patient's volume status. -Continue follow daily weights and maintain adequate hydration. -Continue to wean off oxygen supplementation as tolerated (2 L nasal cannula in place with good saturation at discharge).   Feeding problems -In the setting of dysphagia -Continue tube feedings. -Patient at high risk for silent aspiration.   Depression with anxiety -No suicidal ideation or hallucination -Continue prozac -Continue constant reorientation and supportive care. -Flat affect appreciated on exam.   GERD (gastroesophageal reflux disease) -continue PPI -30 degree head of bed elevation to minimize reflux/silent aspiration.   Hypothyroidism -Continue Synthroid.   Moderate protein calorie malnutrition -Continue tube feedings and feeding supplements -Body mass index is 24.37 kg/m.   Intertrigo/stage II pressure injury -Intertrigo changes around PEG tube entrance point -Stage II sacral pressure injury present at time of admission without superimposed infection -Continue topical nystatin, preventive measurements and constant repositioning.   Physical deconditioning -Patient has been assessed by physical therapy with recommendation for skilled nursing facility in order to pursued rehabilitation and conditioning. -Anticipate medical readiness on 08/25/2023 for discharge to skilled nursing facility.  Consultants: Nutritional service. Procedures performed: 2D echo; see below for x-ray reports. Disposition: Skilled nursing facility.  (Short-term rehab). Diet recommendation: Main nutrition to be provided through PEG tube.  DISCHARGE MEDICATION: Allergies as of 08/25/2023  Physician Discharge Summary   Patient: Tasha Moss MRN: [id number] DOB: 05-23-1936  Admit date:     08/21/2023  Discharge date: 08/25/23  Discharge Physician: Vassie Loll   PCP: Sheliah Hatch, MD   Recommendations at discharge:  Repeat basic metabolic panel in 5 days to follow ultralights and renal function Continue weaning off oxygen supplementation as tolerated Follow daily weights, strict I's and O's, and response to diuretics.  Discharge Diagnoses: Principal Problem:   Acute respiratory failure with hypoxia (HCC) Active Problems:   Hypothyroidism   GERD (gastroesophageal reflux disease)   Depression with anxiety   Feeding problems  Brief hospital admission narrative: As per H&P written by Dr.Zierle-Ghosh on 08/22/23 Tasha Moss is a 87 y.o. female with medical history significant of anxiety, GERD, hyperlipidemia, hypertension, hypothyroidism, and more presents ED with a chief complaint of dyspnea.  Patient reports that she has had dyspnea, coughing, weakness.  Her PCP could not see her so she is here now.  She reports this started 3 weeks ago when she was diagnosed with pneumonia after a chest x-ray was done.  She was started on antibiotic and she finished the full course.  Patient reports she has not been any better since then.  She has had a dry cough, no fever.  She reports no swelling in her legs.  She cannot lay flat, but has a difficult time knowing if it is due to breathing or if it is because of spinal issues that she has had chronically.  Patient reports that she has had some pain in her back and shoulders.  This started about 3 weeks ago as well.  She has been treated with Tylenol and that has helped.  Patient has no other complaints at this time.   Assessment and Plan:  Acute respiratory failure with hypoxia (HCC) - COVID-negative -Troponin 15>> 17 and no signs of ischemic changes -2D echo demonstrating grade 1 diastolic dysfunction with preserved ejection  fraction and no significant valvular disorder. -Patient at high risk for pneumonia process/aspiration. -Continue the use of Lasix 20 mg every other day and follow-up patient's volume status. -Continue flutter valve and complete antibiotics using Augmentin. -Continue to follow close assessment of patient's volume status. -Continue follow daily weights and maintain adequate hydration. -Continue to wean off oxygen supplementation as tolerated (2 L nasal cannula in place with good saturation at discharge).   Feeding problems -In the setting of dysphagia -Continue tube feedings. -Patient at high risk for silent aspiration.   Depression with anxiety -No suicidal ideation or hallucination -Continue prozac -Continue constant reorientation and supportive care. -Flat affect appreciated on exam.   GERD (gastroesophageal reflux disease) -continue PPI -30 degree head of bed elevation to minimize reflux/silent aspiration.   Hypothyroidism -Continue Synthroid.   Moderate protein calorie malnutrition -Continue tube feedings and feeding supplements -Body mass index is 24.37 kg/m.   Intertrigo/stage II pressure injury -Intertrigo changes around PEG tube entrance point -Stage II sacral pressure injury present at time of admission without superimposed infection -Continue topical nystatin, preventive measurements and constant repositioning.   Physical deconditioning -Patient has been assessed by physical therapy with recommendation for skilled nursing facility in order to pursued rehabilitation and conditioning. -Anticipate medical readiness on 08/25/2023 for discharge to skilled nursing facility.  Consultants: Nutritional service. Procedures performed: 2D echo; see below for x-ray reports. Disposition: Skilled nursing facility.  (Short-term rehab). Diet recommendation: Main nutrition to be provided through PEG tube.  DISCHARGE MEDICATION: Allergies as of 08/25/2023  No using accessory muscles.  O2 sat is stable with 2 L supplementation. Cardiovascular system: Rate controlled, no rubs, no gallops, no JVD. Gastrointestinal system: Abdomen is nondistended, soft and nontender.  PEG tube in place.  Positive bowel sounds. Central nervous system: Moving 4 limbs spontaneously.  No focal neurological deficits. Extremities: No cyanosis, clubbing or edema. Skin: No petechiae.;  Stage II sacral pressure injury present at time of admission without signs of superimposed infection. Psychiatry: Judgement and insight appear normal. Mood & affect appropriate.    Condition at discharge: Stable.  The results of significant diagnostics from this hospitalization (including imaging,  microbiology, ancillary and laboratory) are listed below for reference.   Imaging Studies: ECHOCARDIOGRAM COMPLETE  Result Date: 08/23/2023    ECHOCARDIOGRAM REPORT   Patient Name:   RHYLAN BUOL Date of Exam: 08/23/2023 Medical Rec #:  [id number]      Height:       64.0 in Accession #:    [id number]     Weight:       142.0 lb Date of Birth:  1936-07-27       BSA:          1.691 m Patient Age:    87 years       BP:           121/74 mmHg Patient Gender: F              HR:           91 bpm. [facility] Procedure: 2D Echo, Cardiac Doppler and Color Doppler Indications:    Dyspnea  History:        Patient has prior history of Echocardiogram examinations, most                 recent 01/09/2022. Signs/Symptoms:Dyspnea and                 Dizziness/Lightheadedness; Risk Factors:Hypertension and                 Dyslipidemia.  Sonographer:    Mikki Harbor Referring Phys: [phone] Kage Willmann IMPRESSIONS  1. Left ventricular ejection fraction, by estimation, is 60 to 65%. The left ventricle has normal function. The left ventricle has no regional wall motion abnormalities. Left ventricular diastolic parameters are consistent with Grade I diastolic dysfunction (impaired relaxation).  2. Right ventricular systolic function was not well visualized. The right ventricular size is not well visualized. Tricuspid regurgitation signal is inadequate for assessing PA pressure.  3. The mitral valve is abnormal. Mild mitral valve regurgitation. No evidence of mitral stenosis.  4. The aortic valve is tricuspid. There is mild calcification of the aortic valve. There is mild thickening of the aortic valve. Aortic valve regurgitation is not visualized. No aortic stenosis is present. FINDINGS  Left Ventricle: Left ventricular ejection fraction, by estimation, is 60 to 65%. The left ventricle has normal function. The left ventricle has no regional wall motion abnormalities. The left ventricular internal cavity size was normal  in size. There is  no left ventricular hypertrophy. Left ventricular diastolic parameters are consistent with Grade I diastolic dysfunction (impaired relaxation). Right Ventricle: The right ventricular size is not well visualized. Right vetricular wall thickness was not well visualized. Right ventricular systolic function was not well visualized. Tricuspid regurgitation signal is inadequate for assessing PA pressure. The tricuspid regurgitant velocity is 2.04 m/s, and with an assumed right atrial pressure of 8 mmHg, the estimated right ventricular systolic pressure is  No using accessory muscles.  O2 sat is stable with 2 L supplementation. Cardiovascular system: Rate controlled, no rubs, no gallops, no JVD. Gastrointestinal system: Abdomen is nondistended, soft and nontender.  PEG tube in place.  Positive bowel sounds. Central nervous system: Moving 4 limbs spontaneously.  No focal neurological deficits. Extremities: No cyanosis, clubbing or edema. Skin: No petechiae.;  Stage II sacral pressure injury present at time of admission without signs of superimposed infection. Psychiatry: Judgement and insight appear normal. Mood & affect appropriate.    Condition at discharge: Stable.  The results of significant diagnostics from this hospitalization (including imaging,  microbiology, ancillary and laboratory) are listed below for reference.   Imaging Studies: ECHOCARDIOGRAM COMPLETE  Result Date: 08/23/2023    ECHOCARDIOGRAM REPORT   Patient Name:   RHYLAN BUOL Date of Exam: 08/23/2023 Medical Rec #:  [id number]      Height:       64.0 in Accession #:    [id number]     Weight:       142.0 lb Date of Birth:  1936-07-27       BSA:          1.691 m Patient Age:    87 years       BP:           121/74 mmHg Patient Gender: F              HR:           91 bpm. [facility]am Location[facility] Procedure: 2D Echo, Cardiac Doppler and Color Doppler Indications:    Dyspnea  History:        Patient has prior history of Echocardiogram examinations, most                 recent 01/09/2022. Signs/Symptoms:Dyspnea and                 Dizziness/Lightheadedness; Risk Factors:Hypertension and                 Dyslipidemia.  Sonographer:    Mikki Harbor Referring Phys: [phone] Kage Willmann IMPRESSIONS  1. Left ventricular ejection fraction, by estimation, is 60 to 65%. The left ventricle has normal function. The left ventricle has no regional wall motion abnormalities. Left ventricular diastolic parameters are consistent with Grade I diastolic dysfunction (impaired relaxation).  2. Right ventricular systolic function was not well visualized. The right ventricular size is not well visualized. Tricuspid regurgitation signal is inadequate for assessing PA pressure.  3. The mitral valve is abnormal. Mild mitral valve regurgitation. No evidence of mitral stenosis.  4. The aortic valve is tricuspid. There is mild calcification of the aortic valve. There is mild thickening of the aortic valve. Aortic valve regurgitation is not visualized. No aortic stenosis is present. FINDINGS  Left Ventricle: Left ventricular ejection fraction, by estimation, is 60 to 65%. The left ventricle has normal function. The left ventricle has no regional wall motion abnormalities. The left ventricular internal cavity size was normal  in size. There is  no left ventricular hypertrophy. Left ventricular diastolic parameters are consistent with Grade I diastolic dysfunction (impaired relaxation). Right Ventricle: The right ventricular size is not well visualized. Right vetricular wall thickness was not well visualized. Right ventricular systolic function was not well visualized. Tricuspid regurgitation signal is inadequate for assessing PA pressure. The tricuspid regurgitant velocity is 2.04 m/s, and with an assumed right atrial pressure of 8 mmHg, the estimated right ventricular systolic pressure is

## 2023-08-25 NOTE — TOC Progression Note (Signed)
Transition of Care [facility]) - Progression Note    Patient Details  Name: Tasha Moss MRN: [id number] Date of Birth: Mar 10, 1936  Transition of Care [facility]) CM/SW Contact  Karn Cassis, [place] Phone Number: 08/25/2023, 8:40 AM  Clinical Narrative:  Pt's husband accepts bed offer at [facility]. Facility notified.      Expected Discharge Plan: Skilled Nursing Facility Barriers to Discharge: Continued Medical Work up  Expected Discharge Plan and Services In-house Referral: Clinical Social Work   Post Acute Care Choice: Skilled Nursing Facility Living arrangements for the past 2 months: Single Family Home                                       Social Determinants of Health (SDOH) Interventions SDOH Screenings   Food Insecurity: No Food Insecurity (08/22/2023)  Housing: Low Risk  (08/22/2023)  Transportation Needs: No Transportation Needs (08/22/2023)  Utilities: Not At Risk (08/22/2023)  Alcohol Screen: Low Risk  (02/04/2023)  Depression (PHQ2-9): Low Risk  (02/04/2023)  Financial Resource Strain: Low Risk  (02/04/2023)  Physical Activity: Sufficiently Active (02/04/2023)  Social Connections: Socially Integrated (02/04/2023)  Stress: No Stress Concern Present (02/04/2023)  Tobacco Use: Low Risk  (08/21/2023)    Readmission Risk Interventions     No data to display

## 2023-08-25 NOTE — Care Management Important Message (Signed)
Important Message  Patient Details  Name: Tasha Moss MRN: [id number] Date of Birth: 1936/03/27   Medicare Important Message Given:  Yes     Corey Harold 08/25/2023, 2:05 PM

## 2023-08-25 NOTE — TOC Transition Note (Signed)
Transition of Care [facility]) - CM/SW Discharge Note   Patient Details  Name: Tasha Moss MRN: [id number] Date of Birth: 30-Mar-1936  Transition of Care [facility]) CM/SW Contact:  Karn Cassis, LCSW Phone Number: 08/25/2023, 2:34 PM   Clinical Narrative: Pt d/c today to [facility]. Pt's husband, daughter, and SNF aware and agreeable. Authorization received. D/C summary sent to SNF. Pt will transfer with staff. RN given number to call report.       Final next level of care: Skilled Nursing Facility Barriers to Discharge: Barriers Resolved   Patient Goals and CMS Choice CMS Medicare.gov Compare Post Acute Care list provided to:: Patient Represenative (must comment) Choice offered to / list presented to : Spouse  Discharge Placement                  Patient to be transferred to facility by: staff Name of family member notified: Cala Bradford Patient and family notified of of transfer: 08/25/23  Discharge Plan and Services Additional resources added to the After Visit Summary for   In-house Referral: Clinical Social Work   Post Acute Care Choice: Skilled Nursing Facility                               Social Determinants of Health (SDOH) Interventions SDOH Screenings   Food Insecurity: No Food Insecurity (08/22/2023)  Housing: Low Risk  (08/22/2023)  Transportation Needs: No Transportation Needs (08/22/2023)  Utilities: Not At Risk (08/22/2023)  Alcohol Screen: Low Risk  (02/04/2023)  Depression (PHQ2-9): Low Risk  (02/04/2023)  Financial Resource Strain: Low Risk  (02/04/2023)  Physical Activity: Sufficiently Active (02/04/2023)  Social Connections: Socially Integrated (02/04/2023)  Stress: No Stress Concern Present (02/04/2023)  Tobacco Use: Low Risk  (08/21/2023)     Readmission Risk Interventions     No data to display

## 2023-08-25 NOTE — Plan of Care (Signed)

## 2023-08-25 NOTE — Plan of Care (Signed)
Problem: Activity: Goal: Ability to tolerate increased activity will improve Outcome: Progressing   Problem: Clinical Measurements: Goal: Ability to maintain a body temperature in the normal range will improve Outcome: Progressing

## 2023-08-26 ENCOUNTER — Encounter: Payer: Self-pay | Admitting: Adult Health

## 2023-08-26 ENCOUNTER — Non-Acute Institutional Stay (SKILLED_NURSING_FACILITY): Payer: Medicare Other | Admitting: Adult Health

## 2023-08-26 DIAGNOSIS — R1310 Dysphagia, unspecified: Secondary | ICD-10-CM | POA: Diagnosis not present

## 2023-08-26 DIAGNOSIS — R600 Localized edema: Secondary | ICD-10-CM | POA: Diagnosis not present

## 2023-08-26 DIAGNOSIS — E440 Moderate protein-calorie malnutrition: Secondary | ICD-10-CM

## 2023-08-26 DIAGNOSIS — K222 Esophageal obstruction: Secondary | ICD-10-CM

## 2023-08-26 DIAGNOSIS — I700 Atherosclerosis of aorta: Secondary | ICD-10-CM | POA: Insufficient documentation

## 2023-08-26 DIAGNOSIS — J3089 Other allergic rhinitis: Secondary | ICD-10-CM | POA: Diagnosis not present

## 2023-08-26 DIAGNOSIS — E063 Autoimmune thyroiditis: Secondary | ICD-10-CM | POA: Diagnosis not present

## 2023-08-26 DIAGNOSIS — K219 Gastro-esophageal reflux disease without esophagitis: Secondary | ICD-10-CM

## 2023-08-26 DIAGNOSIS — J9601 Acute respiratory failure with hypoxia: Secondary | ICD-10-CM | POA: Diagnosis not present

## 2023-08-26 DIAGNOSIS — Z931 Gastrostomy status: Secondary | ICD-10-CM | POA: Diagnosis not present

## 2023-08-26 DIAGNOSIS — F418 Other specified anxiety disorders: Secondary | ICD-10-CM

## 2023-08-26 LAB — CULTURE, BLOOD (ROUTINE X 2)
Culture: NO GROWTH
Culture: NO GROWTH
Special Requests: ADEQUATE
Special Requests: ADEQUATE

## 2023-08-26 NOTE — Progress Notes (Signed)
Location:  [facility] Room Number: 151 Place of Service:  SNF (31)   CODE STATUS: full   Allergies  Allergen Reactions   Gabapentin Nausea And Vomiting    hallucinations hallucinations   Other Nausea And Vomiting    Strong pain medications cause n/v Strong pain medications cause n/v   Morphine Nausea And Vomiting    Nausea & vomiting Nausea & vomiting    Chief Complaint  Patient presents with   Hospitalization Follow-up    HPI:  She is a 87 year old woman who has been hospitalized from 08-21-23 through 08-25-23. Her medical history includes: hyperlipidemia; hypertension; hypothyroidism and dysphagia. She presented to the ED due to dyspnea, coughing and weakness. This started about 3 weeks prior to her hospitalization. She was diagnosed with pneumonia and was treated with antibiotics.  Acute respiratory failure with hypoxia: no signs of ischemic changes; has preserved EF. She is at high risk for pneumonia. Is on lasix 20 mg every other day. She will need to complete her augmentin with therapy ending in 5 days.  She has a history of dysphagia is on long term tube feeding for her nutritional needs.  She is here for short term rehab with her goal to return back home. She will continue to be followed for her chronic illnesses including:       Non-seasonal allergic rhinitis unspecified trigger:    Dysphagia unspecified type; GERD without esophagitis; stricture and stenosis of esophagus:   Past Medical History:  Diagnosis Date   Allergy    seasonal & dust   Anxiety    Arthritis    DJD   Cellulitis    Family history of adverse reaction to anesthesia    n/v    GERD (gastroesophageal reflux disease)    History of vertigo    Hyperlipidemia    Hypertension    Hypothyroidism    PONV (postoperative nausea and vomiting)    Thyroid disease    hypothyroidism post Hashimoto's & partial thyroidectomy    Past Surgical History:  Procedure Laterality Date    BIOPSY  01/11/2022   Procedure: BIOPSY;  Surgeon: Lemar Lofty., MD;  Location: [facility];  Service: Gastroenterology;;   CATARACT EXTRACTION W/ INTRAOCULAR LENS  IMPLANT, BILATERAL     COLONOSCOPY     diverticulosis   ESOPHAGOGASTRODUODENOSCOPY (EGD) WITH PROPOFOL N/A 01/11/2022   Procedure: ESOPHAGOGASTRODUODENOSCOPY (EGD) WITH PROPOFOL;  Surgeon: Lemar Lofty., MD;  Location: [facility];  Service: Gastroenterology;  Laterality: N/A;   GASTROSTOMY N/A 01/17/2022   Procedure: OPEN GASTROSTOMY PLACEMENT;  Surgeon: Harriette Bouillon, MD;  Location: [facility];  Service: General;  Laterality: N/A;   IR GASTROSTOMY TUBE MOD SED  01/15/2022   IR REPLACE G-TUBE SIMPLE WO FLUORO  03/13/2023   IR REPLC GASTRO/COLONIC TUBE PERCUT W/FLUORO  02/20/2023   JOINT REPLACEMENT  1995 & 1999   THR bilaterally    lumbar disc repair  1989   Dr Mercy Riding   SAVORY DILATION N/A 01/11/2022   Procedure: Gaspar Bidding DILATION;  Surgeon: Lemar Lofty., MD;  Location: [facility];  Service: Gastroenterology;  Laterality: N/A;   SPINE SURGERY     cervical ; Dr Venetia Maxon   THYROIDECTOMY, PARTIAL     benign nodule   TONSILLECTOMY AND ADENOIDECTOMY     TOTAL SHOULDER ARTHROPLASTY Left 02/27/2016   Procedure: LEFT REVERSE TOTAL SHOULDER ARTHROPLASTY;  Surgeon: Sheral Apley, MD;  Location: [facility];  Service: Orthopedics;  Laterality: Left;  vitreous detachment     Dr Nelle Don    Social History   Socioeconomic History   Marital status: Married    Spouse name: Not on file   Number of children: 2   Years of education: Not on file   Highest education level: Not on file  Occupational History   Not on file  Tobacco Use   Smoking status: Never   Smokeless tobacco: Never  Vaping Use   Vaping status: Never Used  Substance and Sexual Activity   Alcohol use: No   Drug use: No   Sexual activity: Not on file  Other Topics Concern   Not on file  Social History Narrative   Married for 63 years     Daughter-nurse   Daughter- paralegal       Right handed   Social Determinants of Health   Financial Resource Strain: Low Risk  (02/04/2023)   Overall Financial Resource Strain (CARDIA)    Difficulty of Paying Living Expenses: Not hard at all  Food Insecurity: No Food Insecurity (08/22/2023)   Hunger Vital Sign    Worried About Running Out of Food in the Last Year: Never true    Ran Out of Food in the Last Year: Never true  Transportation Needs: No Transportation Needs (08/22/2023)   PRAPARE - Administrator, Civil Service (Medical): No    Lack of Transportation (Non-Medical): No  Physical Activity: Sufficiently Active (02/04/2023)   Exercise Vital Sign    Days of Exercise per Week: 5 days    Minutes of Exercise per Session: 30 min  Stress: No Stress Concern Present (02/04/2023)   [employer] of Occupational Health - Occupational Stress Questionnaire    Feeling of Stress : Not at all  Social Connections: Socially Integrated (02/04/2023)   Social Connection and Isolation Panel [NHANES]    Frequency of Communication with Friends and Family: More than three times a week    Frequency of Social Gatherings with Friends and Family: More than three times a week    Attends Religious Services: More than 4 times per year    Active Member of [employer] or Organizations: Yes    Attends Engineer, structural: More than 4 times per year    Marital Status: Married  Catering manager Violence: Not At Risk (08/22/2023)   Humiliation, Afraid, Rape, and Kick questionnaire    Fear of Current or Ex-Partner: No    Emotionally Abused: No    Physically Abused: No    Sexually Abused: No   Family History  Problem Relation Age of Onset   Diabetes Mother    Hepatitis Mother        Hepatitis B,? etiology   Miscarriages / Stillbirths Mother    Stroke Mother 44   Heart disease Father        CHF   Cancer Paternal Aunt        GI cancers   Heart disease Paternal Aunt        CHF   Cancer  Paternal Uncle        lung, GI   Heart disease Paternal Uncle        CHF   Colon cancer Neg Hx    Esophageal cancer Neg Hx    Pancreatic cancer Neg Hx    Rectal cancer Neg Hx    Stomach cancer Neg Hx       VITAL SIGNS BP 122/61   Pulse 95   Temp 98 F (36.7 C)  Ht 5\' 5"  (1.651 m)   Wt 140 lb (63.5 kg)   BMI 23.30 kg/m   Outpatient Encounter Medications as of 08/26/2023  Medication Sig Note   omeprazole (PRILOSEC) 40 MG capsule Take 40 mg by mouth 2 (two) times daily.    acetaminophen (TYLENOL) 500 MG tablet Take 500 mg by mouth every 6 (six) hours as needed for moderate pain.    albuterol (VENTOLIN HFA) 108 (90 Base) MCG/ACT inhaler Inhale 2 puffs into the lungs every 6 (six) hours as needed for wheezing or shortness of breath. 08/21/2023: Pt request spacer   amoxicillin-clavulanate (AUGMENTIN) 600-42.9 MG/5ML suspension Place 7.3 mLs (875 mg total) into feeding tube every 12 (twelve) hours for 5 days.    FLUoxetine (PROZAC) 20 MG capsule TAKE 1 CAPSULE BY MOUTH DAILY.    fluticasone (FLONASE) 50 MCG/ACT nasal spray Place 1 spray into both nostrils as needed for allergies or rhinitis.    furosemide (LASIX) 20 MG tablet Take 1 tablet (20 mg total) by mouth every other day.    levothyroxine (SYNTHROID) 112 MCG tablet TAKE ONE TABLET BY MOUTH ONCE DAILY.    loratadine (CLARITIN) 10 MG tablet Take 10 mg by mouth daily.    metoCLOPramide (REGLAN) 5 MG tablet Take 1 tablet (5 mg total) by mouth in the morning and at bedtime.    Nutritional Supplements (FEEDING SUPPLEMENT, JEVITY 1.5 CAL/FIBER,) LIQD Place 50 mL/hr into feeding tube continuous. Dose to be titrated per RD at home.    Nystatin (GERHARDT'S BUTT CREAM) CREA Apply 1 Application topically 3 (three) times daily.    nystatin (MYCOSTATIN/NYSTOP) powder Apply 1 Application topically 3 (three) times daily.    Polyvinyl Alcohol-Povidone (ARTIFICIAL TEARS) 5-6 MG/ML SOLN Place 1 drop into both eyes daily.    Protein (FEEDING  SUPPLEMENT, PROSOURCE TF20,) liquid Place 60 mLs into feeding tube 2 (two) times daily.    Water For Irrigation, Sterile (FREE WATER) SOLN Place 150 mLs into feeding tube 6 (six) times daily.    [DISCONTINUED] esomeprazole (NEXIUM) 40 MG capsule PLACE 1 CAPSULE IN FEEDING TUBE TWICE DAILY. (Patient taking differently: Take 40 mg by mouth See admin instructions. PLACE 1 CAPSULE IN FEEDING TUBE TWICE DAILY.)    No facility-administered encounter medications on file as of 08/26/2023.     SIGNIFICANT DIAGNOSTIC EXAMS  TODAY  08-21-23: wbc 8.0; hgb 12.8; hct 40.1; mcv 98. 3plt 164; glucose 131; bun 28; creat 0.52; k+ 4.2; na++ 134; ca 8.3; gfr >60; protein 7.9; albumin 3.0; mag 2.0 08-22-23: tsh 0.742 08-24-23: glucose 156; bun 26; creat 0.54; k+ 3.6; na++ 135; ca 8.3 gfr >60   Review of Systems  Constitutional:  Negative for malaise/fatigue.  Respiratory:  Negative for cough and shortness of breath.   Cardiovascular:  Negative for chest pain, palpitations and leg swelling.  Gastrointestinal:  Negative for abdominal pain, constipation and heartburn.  Musculoskeletal:  Negative for back pain, joint pain and myalgias.  Skin: Negative.   Neurological:  Negative for dizziness.  Psychiatric/Behavioral:  The patient is not nervous/anxious.     Physical Exam Constitutional:      General: She is not in acute distress.    Appearance: She is well-developed. She is not diaphoretic.  HENT:     Ears:     Comments: HOH uses hearing aids  Eyes:     Conjunctiva/sclera: Conjunctivae normal.  Neck:     Thyroid: No thyromegaly.  Cardiovascular:     Rate and Rhythm: Normal rate and regular rhythm.  Pulses: Normal pulses.     Heart sounds: Normal heart sounds.  Pulmonary:     Effort: Pulmonary effort is normal. No respiratory distress.     Breath sounds: Normal breath sounds.  Abdominal:     General: Bowel sounds are normal. There is no distension.     Palpations: Abdomen is soft.      Tenderness: There is no abdominal tenderness.     Comments: Left upper quad peg tube button   Musculoskeletal:        General: Normal range of motion.     Cervical back: Neck supple.     Right lower leg: No edema.     Left lower leg: No edema.  Lymphadenopathy:     Cervical: No cervical adenopathy.  Skin:    General: Skin is warm and dry.  Neurological:     Mental Status: She is alert and oriented to person, place, and time.  Psychiatric:        Mood and Affect: Mood normal.     ASSESSMENT/ PLAN:   TODAY  Acute respiratory failure with hypoxia: is on 02; will continue augementin 875 mg twice daily for total of 5 days; will continue albuterol 2 puffs every 6 hours as needed  2. Non-seasonal allergic rhinitis unspecified trigger: will continue claritin 10 mg daily  3. Dysphagia unspecified type; GERD without esophagitis; stricture and stenosis of esophagus: is on long term tube feedings; will continue prilosec 40 mg twice daily and reglan 5 mg twice daily   4. Hashimotos thyroiditis: is status post partial thyroidectomy; tsh 0.742; will continue synthroid 112 mcg daily   5. Depression with anxiety: will continue prozac 20 mg daily   6. Moderate protein calorie malnutrition: albumin 3.0 will continue prosource 60 mL twice daily   7. Bilateral lower extremity edema: will continue lasix 20 mg every other day  8. Aortic atherosclerosis: ( ct 08-21-23)      Synthia Innocent NP [facility]  call [phone]

## 2023-08-27 ENCOUNTER — Non-Acute Institutional Stay (SKILLED_NURSING_FACILITY): Payer: Medicare Other | Admitting: Internal Medicine

## 2023-08-27 ENCOUNTER — Encounter: Payer: Self-pay | Admitting: Internal Medicine

## 2023-08-27 ENCOUNTER — Other Ambulatory Visit (HOSPITAL_COMMUNITY)
Admission: RE | Admit: 2023-08-27 | Discharge: 2023-08-27 | Disposition: A | Discharge status: Home / Self Care | Payer: Medicare Other | Source: Skilled Nursing Facility | Attending: Internal Medicine | Admitting: Internal Medicine

## 2023-08-27 DIAGNOSIS — E890 Postprocedural hypothyroidism: Secondary | ICD-10-CM | POA: Diagnosis not present

## 2023-08-27 DIAGNOSIS — R627 Adult failure to thrive: Secondary | ICD-10-CM

## 2023-08-27 DIAGNOSIS — I5031 Acute diastolic (congestive) heart failure: Secondary | ICD-10-CM

## 2023-08-27 DIAGNOSIS — R195 Other fecal abnormalities: Secondary | ICD-10-CM

## 2023-08-27 DIAGNOSIS — E440 Moderate protein-calorie malnutrition: Secondary | ICD-10-CM | POA: Diagnosis not present

## 2023-08-27 DIAGNOSIS — J9601 Acute respiratory failure with hypoxia: Secondary | ICD-10-CM

## 2023-08-27 LAB — C DIFFICILE QUICK SCREEN W PCR REFLEX
C Diff antigen: NEGATIVE
C Diff interpretation: NOT DETECTED
C Diff toxin: NEGATIVE

## 2023-08-27 NOTE — Assessment & Plan Note (Signed)
Current Total Protein 5.8 & albumin 2.7. IOW & limb atrophy present. Tube feedings with Nutrition consult.

## 2023-08-27 NOTE — Assessment & Plan Note (Signed)
O2 sats adequate on supplemental O2.At high risk of aspiration; but such not suggested on CT w contrast. No PTE documented.

## 2023-08-27 NOTE — Patient Instructions (Addendum)
See assessment and plan under each diagnosis in the problem list and acutely for this visit 

## 2023-08-27 NOTE — Assessment & Plan Note (Addendum)
Multifactorial including protein /caloric malnutrition & deconditioning. PT/OT reports that she is dependent ambulation in all transfers and requires maximum bed assist.  Rehab as tolerated.  Nutritionist to consult at SNF.

## 2023-08-27 NOTE — Progress Notes (Unsigned)
NURSING HOME LOCATION:  [facility] ROOM NUMBER:  [id number] P  CODE STATUS:  Full Code  PCP: Neena Rhymes MD  This is a comprehensive admission note to this SNFperformed on this date less than 30 days from date of admission. Included are preadmission medical/surgical history; reconciled medication list; family history; social history and comprehensive review of systems.  Corrections and additions to the records were documented. Comprehensive physical exam was also performed. Additionally a clinical summary was entered for each active diagnosis pertinent to this admission in the Problem List to enhance continuity of care.  HPI: She was hospitalized 9/12 - 08/25/2023 with acute respiratory failure with hypoxia.  This was in the context of having had community-acquired pneumonia in late July/early August for which she completed a course of antibiotics.   She presented to the ED with tachypnea ( RR 25), dyspnea, cough, & weakness without fever or purulent secretions.  O2 sats were noted to be 80% on room air.   She has PMH of esophageal stricture/stenosis, S/P dilations.  Chest x-ray suggested possible right upper lobe medial infiltrate or atelectasis as well as retrocardiac infiltrate versus atelectasis.  CT scan revealed moderate left effusion with mild pulmonary edema  and aortic atherosclerosis but no PTE or active infiltrate. Procalcitonin was <0.10.  Echo revealed grade 1 diastolic dysfunction with normal LVEF; BNP was 63. White count was normal.  She did develop a mild anemia with final H/H of 11.2/34.8 with normochromic, normocytic indices.  Protein caloric malnutrition was documented by total protein 5.8 and albumin of 2.3. TSH was 0.742 on 112 mcg L thyroxine. Glucoses ranged from 103-156. Parenteral antibiotics were transitioned to Augmentin @ discharge. PT/OT consulted and recommended SNF placement for rehab due to debilitation and protein/caloric malnutrition.  Past  medical and surgical history: Includes history of Hashimoto's thyroiditis; essential hypertension, dyslipidemia, GERD, and anxiety/depression.  Social history: Nondrinker, never smoker.  Family history: Noncontributory due to advanced age.   Review of systems: When asked why she had been hospitalized she stated "they said I have to have oxygen because of OP (COPD)."  She went on to state that "part of my heart not functioning like the other."  She validates chronic dysphagia which she states is due to "curvature of the spine" and stated that she has had no solid food for 2 years.  All intake has been liquids and all meds are crushed. She is describing frequent bowel movements.  She has not actually visualized the bowel movement but states that she is soiling her undergarments repeatedly. She  describes nonproductive cough despite postnasal drainage.  Her throat stays dry and she exhibits pagophagia.  She describes chronic back and shoulder pain which does respond to Tylenol.  She describes "sores" on the buttocks which are uncomfortable when she sits in the wheelchair.  She describes tingling in her feet when she is sitting upright for a period of time. She validates anxiety & depression.  Constitutional: No fever, significant weight change  Eyes: No redness, discharge, pain, vision change ENT/mouth: No purulent discharge, earache, new change in hearing Cardiovascular: No chest pain, palpitations, paroxysmal nocturnal dyspnea, claudication, edema  Respiratory: No hemoptysis, significant snoring, apnea  Gastrointestinal: No heartburn, abdominal pain, nausea /vomiting, rectal bleeding, melena Genitourinary: No dysuria, hematuria, pyuria Dermatologic: No rash, pruritus, change in appearance of skin Neurologic: No dizziness, headache, syncope, seizures Psychiatric: No insomnia Endocrine: No change in hair/skin/nails, excessive thirst, excessive hunger, excessive urination  Hematologic/lymphatic: No  significant bruising, lymphadenopathy,  abnormal bleeding Allergy/immunology: No itchy/watery eyes, significant sneezing, urticaria, angioedema  Physical exam:  Pertinent or positive findings: She appears her age and suboptimally nourished.  She is markedly hard of hearing.  She exhibits somewhat of a grimacing countenance with a open mouth.  There is slight ptosis of the OD laterally.  Marked erythema intraorally is documented.  Clinically the right lobe of the thyroid appears absent.  She exhibits a slight gallop cadence.  She has low grade rhonchi R > L. G tube present LUQ.There is mild erythema around the tube. She has trace edema at the sock line.  Pedal pulses are decreased.  She has interosseous wasting & limb wasting.  She exhibits tremor of the extended index fingers bilaterally.  General appearance: no acute distress, increased work of breathing is present.   Lymphatic: No lymphadenopathy about the head, neck, axilla. Eyes: No conjunctival inflammation or lid edema is present. There is no scleral icterus. Ears:  External ear exam shows no significant lesions or deformities.   Nose:  External nasal examination shows no deformity or inflammation. Nasal mucosa are pink and moist without lesions, exudates Neck:  No thyromegaly, masses, tenderness noted.    Heart:  No murmur, click, rub.  Lungs: without wheezes, rubs. Abdomen: Bowel sounds are normal.  Abdomen is soft and nontender with no organomegaly, hernias, masses. GU: Deferred  Extremities:  No cyanosis, no significant clubbing. Neurologic exam:  Balance, Rhomberg, finger to nose testing could not be completed due to clinical state Deep tendon reflexes are equal Skin: Warm & dry w/o tenting. No significant lesions or rash.  See clinical summary under each active problem in the Problem List with associated updated therapeutic plan

## 2023-08-27 NOTE — Assessment & Plan Note (Signed)
Current TSH 0.742 on 112 mcg L thyroxine. Recheck in 6- 8 weeks appropriate.

## 2023-08-27 NOTE — Assessment & Plan Note (Addendum)
08/21/2023 Grade 1 Diastolic Dysfunction w/o frank CHF. Monitor for exacerbation.

## 2023-08-28 ENCOUNTER — Other Ambulatory Visit: Payer: Self-pay | Admitting: *Deleted

## 2023-08-28 ENCOUNTER — Encounter: Payer: Self-pay | Admitting: Adult Health

## 2023-08-28 ENCOUNTER — Non-Acute Institutional Stay (SKILLED_NURSING_FACILITY): Payer: Medicare Other | Admitting: Adult Health

## 2023-08-28 DIAGNOSIS — I5032 Chronic diastolic (congestive) heart failure: Secondary | ICD-10-CM | POA: Diagnosis not present

## 2023-08-28 NOTE — Progress Notes (Signed)
Location:  [facility] Room Number: 151P Place of Service:  SNF (31)   CODE STATUS: full   Allergies  Allergen Reactions   Gabapentin Nausea And Vomiting    hallucinations hallucinations   Other Nausea And Vomiting    Strong pain medications cause n/v Strong pain medications cause n/v   Morphine Nausea And Vomiting    Nausea & vomiting Nausea & vomiting    Chief Complaint  Patient presents with   Acute Visit    follow up chest x-ray    HPI:  She had a chest x-ray performed yesterday due to her respiratory status. Her x-ray demonstrates mild chf. She is presently taking free water of 150 cc every 4 hours routinely. She is taking lasix 20 mg every other day. She denies any cough or shortness of breath.    Past Medical History:  Diagnosis Date   Allergy    seasonal & dust   Anxiety    Arthritis    DJD   Cellulitis    Family history of adverse reaction to anesthesia    n/v    GERD (gastroesophageal reflux disease)    History of vertigo    Hyperlipidemia    Hypertension    Hypothyroidism    PONV (postoperative nausea and vomiting)    Thyroid disease    hypothyroidism post Hashimoto's & partial thyroidectomy    Past Surgical History:  Procedure Laterality Date   BIOPSY  01/11/2022   Procedure: BIOPSY;  Surgeon: Meridee Score Netty Starring., MD;  Location: [facility];  Service: Gastroenterology;;   CATARACT EXTRACTION W/ INTRAOCULAR LENS  IMPLANT, BILATERAL     COLONOSCOPY     diverticulosis   ESOPHAGOGASTRODUODENOSCOPY (EGD) WITH PROPOFOL N/A 01/11/2022   Procedure: ESOPHAGOGASTRODUODENOSCOPY (EGD) WITH PROPOFOL;  Surgeon: Lemar Lofty., MD;  Location: [facility];  Service: Gastroenterology;  Laterality: N/A;   GASTROSTOMY N/A 01/17/2022   Procedure: OPEN GASTROSTOMY PLACEMENT;  Surgeon: Harriette Bouillon, MD;  Location: [facility];  Service: General;  Laterality: N/A;   IR GASTROSTOMY TUBE MOD SED  01/15/2022   IR REPLACE G-TUBE SIMPLE WO  FLUORO  03/13/2023   IR REPLC GASTRO/COLONIC TUBE PERCUT W/FLUORO  02/20/2023   JOINT REPLACEMENT  1995 & 1999   THR bilaterally    lumbar disc repair  1989   Dr Mercy Riding   SAVORY DILATION N/A 01/11/2022   Procedure: Gaspar Bidding DILATION;  Surgeon: Lemar Lofty., MD;  Location: [facility];  Service: Gastroenterology;  Laterality: N/A;   SPINE SURGERY     cervical ; Dr Venetia Maxon   THYROIDECTOMY, PARTIAL     benign nodule   TONSILLECTOMY AND ADENOIDECTOMY     TOTAL SHOULDER ARTHROPLASTY Left 02/27/2016   Procedure: LEFT REVERSE TOTAL SHOULDER ARTHROPLASTY;  Surgeon: Sheral Apley, MD;  Location: [facility];  Service: Orthopedics;  Laterality: Left;   vitreous detachment     Dr Nelle Don    Social History   Socioeconomic History   Marital status: Married    Spouse name: Not on file   Number of children: 2   Years of education: Not on file   Highest education level: Not on file  Occupational History   Not on file  Tobacco Use   Smoking status: Never   Smokeless tobacco: Never  Vaping Use   Vaping status: Never Used  Substance and Sexual Activity   Alcohol use: No   Drug use: No   Sexual activity: Not on file  Other Topics Concern  Not on file  Social History Narrative   Married for 63 years    Daughter-nurse   Daughter- paralegal       Right handed   Social Determinants of Health   Financial Resource Strain: Low Risk  (02/04/2023)   Overall Financial Resource Strain (CARDIA)    Difficulty of Paying Living Expenses: Not hard at all  Food Insecurity: No Food Insecurity (08/22/2023)   Hunger Vital Sign    Worried About Running Out of Food in the Last Year: Never true    Ran Out of Food in the Last Year: Never true  Transportation Needs: No Transportation Needs (08/22/2023)   PRAPARE - Administrator, Civil Service (Medical): No    Lack of Transportation (Non-Medical): No  Physical Activity: Sufficiently Active (02/04/2023)   Exercise Vital Sign    Days  of Exercise per Week: 5 days    Minutes of Exercise per Session: 30 min  Stress: No Stress Concern Present (02/04/2023)   [employer] of Occupational Health - Occupational Stress Questionnaire    Feeling of Stress : Not at all  Social Connections: Socially Integrated (02/04/2023)   Social Connection and Isolation Panel [NHANES]    Frequency of Communication with Friends and Family: More than three times a week    Frequency of Social Gatherings with Friends and Family: More than three times a week    Attends Religious Services: More than 4 times per year    Active Member of [employer] or Organizations: Yes    Attends Engineer, structural: More than 4 times per year    Marital Status: Married  Catering manager Violence: Not At Risk (08/22/2023)   Humiliation, Afraid, Rape, and Kick questionnaire    Fear of Current or Ex-Partner: No    Emotionally Abused: No    Physically Abused: No    Sexually Abused: No   Family History  Problem Relation Age of Onset   Diabetes Mother    Hepatitis Mother        Hepatitis B,? etiology   Miscarriages / Stillbirths Mother    Stroke Mother 53   Heart disease Father        CHF   Cancer Paternal Aunt        GI cancers   Heart disease Paternal Aunt        CHF   Cancer Paternal Uncle        lung, GI   Heart disease Paternal Uncle        CHF   Colon cancer Neg Hx    Esophageal cancer Neg Hx    Pancreatic cancer Neg Hx    Rectal cancer Neg Hx    Stomach cancer Neg Hx       VITAL SIGNS BP 118/64   Pulse 99   Temp 98.4 F (36.9 C)   Resp 20   Ht 5\' 5"  (1.651 m)   Wt 141 lb 12.8 oz (64.3 kg)   SpO2 95%   BMI 23.60 kg/m   Outpatient Encounter Medications as of 08/28/2023  Medication Sig Note   acetaminophen (TYLENOL) 500 MG tablet Take 500 mg by mouth every 6 (six) hours as needed for moderate pain.    albuterol (VENTOLIN HFA) 108 (90 Base) MCG/ACT inhaler Inhale 2 puffs into the lungs every 6 (six) hours as needed for wheezing or  shortness of breath. 08/21/2023: Pt request spacer   [EXPIRED] amoxicillin-clavulanate (AUGMENTIN) 600-42.9 MG/5ML suspension Place 7.3 mLs (875 mg total) into feeding  tube every 12 (twelve) hours for 5 days.    fluticasone (FLONASE) 50 MCG/ACT nasal spray Place 1 spray into both nostrils as needed for allergies or rhinitis.    furosemide (LASIX) 20 MG tablet Take 1 tablet (20 mg total) by mouth every other day.    levothyroxine (SYNTHROID) 112 MCG tablet TAKE ONE TABLET BY MOUTH ONCE DAILY.    loratadine (CLARITIN) 10 MG tablet Take 10 mg by mouth daily.    metoCLOPramide (REGLAN) 5 MG tablet Take 1 tablet (5 mg total) by mouth in the morning and at bedtime.    Nutritional Supplements (FEEDING SUPPLEMENT, JEVITY 1.5 CAL/FIBER,) LIQD Place 50 mL/hr into feeding tube continuous. Dose to be titrated per RD at home.    Nystatin (GERHARDT'S BUTT CREAM) CREA Apply 1 Application topically 3 (three) times daily.    nystatin (MYCOSTATIN/NYSTOP) powder Apply 1 Application topically 3 (three) times daily.    omeprazole (PRILOSEC) 40 MG capsule Take 40 mg by mouth 2 (two) times daily.    Polyvinyl Alcohol-Povidone (ARTIFICIAL TEARS) 5-6 MG/ML SOLN Place 1 drop into both eyes daily.    Protein (FEEDING SUPPLEMENT, PROSOURCE TF20,) liquid Place 60 mLs into feeding tube 2 (two) times daily.    Water For Irrigation, Sterile (FREE WATER) SOLN Place 150 mLs into feeding tube 6 (six) times daily.    [DISCONTINUED] FLUoxetine (PROZAC) 20 MG capsule TAKE 1 CAPSULE BY MOUTH DAILY.    No facility-administered encounter medications on file as of 08/28/2023.     SIGNIFICANT DIAGNOSTIC EXAMS  TODAY  08-27-23: chest x-ray: mild chf    TODAY  08-21-23: wbc 8.0; hgb 12.8; hct 40.1; mcv 98. 3plt 164; glucose 131; bun 28; creat 0.52; k+ 4.2; na++ 134; ca 8.3; gfr >60; protein 7.9; albumin 3.0; mag 2.0 08-22-23: tsh 0.742 08-24-23: glucose 156; bun 26; creat 0.54; k+ 3.6; na++ 135; ca 8.3 gfr >60   Review of Systems   Constitutional:  Negative for malaise/fatigue.  Respiratory:  Negative for cough and shortness of breath.   Cardiovascular:  Negative for chest pain, palpitations and leg swelling.  Gastrointestinal:  Negative for abdominal pain, constipation and heartburn.  Musculoskeletal:  Negative for back pain, joint pain and myalgias.  Skin: Negative.   Neurological:  Negative for dizziness.  Psychiatric/Behavioral:  The patient is not nervous/anxious.     Physical Exam Constitutional:      General: She is not in acute distress.    Appearance: She is well-developed. She is not diaphoretic.  HENT:     Ears:     Comments: HOH uses hearing aids  Eyes:     Conjunctiva/sclera: Conjunctivae normal.  Neck:     Thyroid: No thyromegaly.  Cardiovascular:     Rate and Rhythm: Normal rate and regular rhythm.     Pulses: Normal pulses.     Heart sounds: Normal heart sounds.  Pulmonary:     Effort: Pulmonary effort is normal. No respiratory distress.     Breath sounds: Normal breath sounds.  Abdominal:     General: Bowel sounds are normal. There is no distension.     Palpations: Abdomen is soft.     Tenderness: There is no abdominal tenderness.     Comments: Left upper quad peg tube button   Musculoskeletal:        General: Normal range of motion.     Cervical back: Neck supple.     Right lower leg: No edema.     Left lower leg: No edema.  Lymphadenopathy:     Cervical: No cervical adenopathy.  Skin:    General: Skin is warm and dry.  Neurological:     Mental Status: She is alert and oriented to person, place, and time.  Psychiatric:        Mood and Affect: Mood normal.     ASSESSMENT/ PLAN:   TODAY  Mild congestive heart failure per chest x-ray: EF 60%; per 2-d echo: mild diastolic dysfunction will lower her free water to 150 cc every 6hours and will check bmp/bnp and repeat chest x-ray on 09-01-23.

## 2023-08-28 NOTE — Patient Outreach (Signed)
Tasha Moss resides in [facility]. Screening for potential care coordination/chronic care management services as benefit of health plan and Primary Care Provider.   Collaboration with Melton Alar social worker.  Lynnea Ferrier reports the goal is to return home with spouse. Application for [place]  has been completed. However, Tasha Moss is on the waiting list for the face to face assessment that is required.   Will plan outreach closer to discharge to discuss care coordination/chronic care management needs.    Raiford Noble, MSN, RN, BSN [place]  [facility] RN Post- Acute Care Coordinator Direct Dial: [phone]

## 2023-09-01 ENCOUNTER — Other Ambulatory Visit: Payer: Self-pay | Admitting: Adult Health

## 2023-09-01 ENCOUNTER — Other Ambulatory Visit: Payer: Self-pay | Admitting: Family Medicine

## 2023-09-01 ENCOUNTER — Encounter: Payer: Self-pay | Admitting: Adult Health

## 2023-09-01 ENCOUNTER — Non-Acute Institutional Stay (SKILLED_NURSING_FACILITY): Payer: Medicare Other | Admitting: Adult Health

## 2023-09-01 ENCOUNTER — Other Ambulatory Visit (HOSPITAL_COMMUNITY)
Admission: RE | Admit: 2023-09-01 | Discharge: 2023-09-01 | Disposition: A | Discharge status: Home / Self Care | Payer: Medicare Other | Source: Skilled Nursing Facility | Attending: Adult Health | Admitting: Adult Health

## 2023-09-01 DIAGNOSIS — E870 Hyperosmolality and hypernatremia: Secondary | ICD-10-CM

## 2023-09-01 DIAGNOSIS — I5022 Chronic systolic (congestive) heart failure: Secondary | ICD-10-CM | POA: Insufficient documentation

## 2023-09-01 DIAGNOSIS — I5032 Chronic diastolic (congestive) heart failure: Secondary | ICD-10-CM

## 2023-09-01 LAB — BASIC METABOLIC PANEL
Anion gap: 10 (ref 5–15)
BUN: 31 mg/dL — ABNORMAL HIGH (ref 8–23)
CO2: 34 mmol/L — ABNORMAL HIGH (ref 22–32)
Calcium: 8.5 mg/dL — ABNORMAL LOW (ref 8.9–10.3)
Chloride: 106 mmol/L (ref 98–111)
Creatinine, Ser: 0.51 mg/dL (ref 0.44–1.00)
GFR, Estimated: >60 mL/min (ref >60)
Glucose, Bld: 179 mg/dL — ABNORMAL HIGH (ref 70–99)
Potassium: 3.7 mmol/L (ref 3.5–5.1)
Sodium: 150 mmol/L — ABNORMAL HIGH (ref 135–145)

## 2023-09-01 LAB — BRAIN NATRIURETIC PEPTIDE: B Natriuretic Peptide: 58 pg/mL (ref 0.0–100.0)

## 2023-09-01 NOTE — Progress Notes (Unsigned)
Location:  [facility] Room Number: 151 Place of Service:  SNF (31)   CODE STATUS: full   Allergies  Allergen Reactions   Gabapentin Nausea And Vomiting    hallucinations hallucinations   Other Nausea And Vomiting    Strong pain medications cause n/v Strong pain medications cause n/v   Morphine Nausea And Vomiting    Nausea & vomiting Nausea & vomiting    Chief Complaint  Patient presents with   Acute Visit    Follow up labs     HPI:  Due to her x-ray demonstrating mild chf her free water was lowered to every 6 hours. Her follow up labs does show a sodium level of 150. Today she is not voicing any complaints or concerns.   Past Medical History:  Diagnosis Date   Allergy    seasonal & dust   Anxiety    Arthritis    DJD   Cellulitis    Family history of adverse reaction to anesthesia    n/v    GERD (gastroesophageal reflux disease)    History of vertigo    Hyperlipidemia    Hypertension    Hypothyroidism    PONV (postoperative nausea and vomiting)    Thyroid disease    hypothyroidism post Hashimoto's & partial thyroidectomy    Past Surgical History:  Procedure Laterality Date   BIOPSY  01/11/2022   Procedure: BIOPSY;  Surgeon: Meridee Score Netty Starring., MD;  Location: [facility];  Service: Gastroenterology;;   CATARACT EXTRACTION W/ INTRAOCULAR LENS  IMPLANT, BILATERAL     COLONOSCOPY     diverticulosis   ESOPHAGOGASTRODUODENOSCOPY (EGD) WITH PROPOFOL N/A 01/11/2022   Procedure: ESOPHAGOGASTRODUODENOSCOPY (EGD) WITH PROPOFOL;  Surgeon: Lemar Lofty., MD;  Location: [facility];  Service: Gastroenterology;  Laterality: N/A;   GASTROSTOMY N/A 01/17/2022   Procedure: OPEN GASTROSTOMY PLACEMENT;  Surgeon: Harriette Bouillon, MD;  Location: [facility];  Service: General;  Laterality: N/A;   IR GASTROSTOMY TUBE MOD SED  01/15/2022   IR REPLACE G-TUBE SIMPLE WO FLUORO  03/13/2023   IR REPLC GASTRO/COLONIC TUBE PERCUT W/FLUORO  02/20/2023    JOINT REPLACEMENT  1995 & 1999   THR bilaterally    lumbar disc repair  1989   Dr Mercy Riding   SAVORY DILATION N/A 01/11/2022   Procedure: Gaspar Bidding DILATION;  Surgeon: Lemar Lofty., MD;  Location: [facility];  Service: Gastroenterology;  Laterality: N/A;   SPINE SURGERY     cervical ; Dr Venetia Maxon   THYROIDECTOMY, PARTIAL     benign nodule   TONSILLECTOMY AND ADENOIDECTOMY     TOTAL SHOULDER ARTHROPLASTY Left 02/27/2016   Procedure: LEFT REVERSE TOTAL SHOULDER ARTHROPLASTY;  Surgeon: Sheral Apley, MD;  Location: [facility];  Service: Orthopedics;  Laterality: Left;   vitreous detachment     Dr Nelle Don    Social History   Socioeconomic History   Marital status: Married    Spouse name: Not on file   Number of children: 2   Years of education: Not on file   Highest education level: Not on file  Occupational History   Not on file  Tobacco Use   Smoking status: Never   Smokeless tobacco: Never  Vaping Use   Vaping status: Never Used  Substance and Sexual Activity   Alcohol use: No   Drug use: No   Sexual activity: Not on file  Other Topics Concern   Not on file  Social History Narrative   Married for  63 years    Daughter-nurse   Daughter- paralegal       Right handed   Social Determinants of Health   Financial Resource Strain: Low Risk  (02/04/2023)   Overall Financial Resource Strain (CARDIA)    Difficulty of Paying Living Expenses: Not hard at all  Food Insecurity: No Food Insecurity (08/22/2023)   Hunger Vital Sign    Worried About Running Out of Food in the Last Year: Never true    Ran Out of Food in the Last Year: Never true  Transportation Needs: No Transportation Needs (08/22/2023)   PRAPARE - Administrator, Civil Service (Medical): No    Lack of Transportation (Non-Medical): No  Physical Activity: Sufficiently Active (02/04/2023)   Exercise Vital Sign    Days of Exercise per Week: 5 days    Minutes of Exercise per Session: 30 min   Stress: No Stress Concern Present (02/04/2023)   [employer] of Occupational Health - Occupational Stress Questionnaire    Feeling of Stress : Not at all  Social Connections: Socially Integrated (02/04/2023)   Social Connection and Isolation Panel [NHANES]    Frequency of Communication with Friends and Family: More than three times a week    Frequency of Social Gatherings with Friends and Family: More than three times a week    Attends Religious Services: More than 4 times per year    Active Member of [employer] or Organizations: Yes    Attends Engineer, structural: More than 4 times per year    Marital Status: Married  Catering manager Violence: Not At Risk (08/22/2023)   Humiliation, Afraid, Rape, and Kick questionnaire    Fear of Current or Ex-Partner: No    Emotionally Abused: No    Physically Abused: No    Sexually Abused: No   Family History  Problem Relation Age of Onset   Diabetes Mother    Hepatitis Mother        Hepatitis B,? etiology   Miscarriages / Stillbirths Mother    Stroke Mother 54   Heart disease Father        CHF   Cancer Paternal Aunt        GI cancers   Heart disease Paternal Aunt        CHF   Cancer Paternal Uncle        lung, GI   Heart disease Paternal Uncle        CHF   Colon cancer Neg Hx    Esophageal cancer Neg Hx    Pancreatic cancer Neg Hx    Rectal cancer Neg Hx    Stomach cancer Neg Hx       VITAL SIGNS BP 129/63   Pulse 87   Temp 97.8 F (36.6 C)   Resp 20   Ht 5\' 5"  (1.651 m)   Wt 141 lb 12.8 oz (64.3 kg)   SpO2 93%   BMI 23.60 kg/m   Outpatient Encounter Medications as of 09/01/2023  Medication Sig Note   acetaminophen (TYLENOL) 500 MG tablet Take 500 mg by mouth every 6 (six) hours as needed for moderate pain.    albuterol (VENTOLIN HFA) 108 (90 Base) MCG/ACT inhaler Inhale 2 puffs into the lungs every 6 (six) hours as needed for wheezing or shortness of breath. 08/21/2023: Pt request spacer   FLUoxetine (PROZAC)  20 MG capsule TAKE 1 CAPSULE BY MOUTH DAILY.    fluticasone (FLONASE) 50 MCG/ACT nasal spray Place 1 spray into both  nostrils as needed for allergies or rhinitis.    furosemide (LASIX) 20 MG tablet Take 1 tablet (20 mg total) by mouth every other day.    levothyroxine (SYNTHROID) 112 MCG tablet TAKE ONE TABLET BY MOUTH ONCE DAILY.    loratadine (CLARITIN) 10 MG tablet Take 10 mg by mouth daily.    metoCLOPramide (REGLAN) 5 MG tablet Take 1 tablet (5 mg total) by mouth in the morning and at bedtime.    Nutritional Supplements (FEEDING SUPPLEMENT, JEVITY 1.5 CAL/FIBER,) LIQD Place 50 mL/hr into feeding tube continuous. Dose to be titrated per RD at home.    Nystatin (GERHARDT'S BUTT CREAM) CREA Apply 1 Application topically 3 (three) times daily.    nystatin (MYCOSTATIN/NYSTOP) powder Apply 1 Application topically 3 (three) times daily.    omeprazole (PRILOSEC) 40 MG capsule Take 40 mg by mouth 2 (two) times daily.    Polyvinyl Alcohol-Povidone (ARTIFICIAL TEARS) 5-6 MG/ML SOLN Place 1 drop into both eyes daily.    Protein (FEEDING SUPPLEMENT, PROSOURCE TF20,) liquid Place 60 mLs into feeding tube 2 (two) times daily.    Water For Irrigation, Sterile (FREE WATER) SOLN Place 150 mLs into feeding tube 6 (six) times daily.    No facility-administered encounter medications on file as of 09/01/2023.     SIGNIFICANT DIAGNOSTIC EXAMS  PREVIOUS  08-27-23: chest x-ray: mild chf   PREVIOUS   08-21-23: wbc 8.0; hgb 12.8; hct 40.1; mcv 98. 3plt 164; glucose 131; bun 28; creat 0.52; k+ 4.2; na++ 134; ca 8.3; gfr >60; protein 7.9; albumin 3.0; mag 2.0 08-22-23: tsh 0.742 08-24-23: glucose 156; bun 26; creat 0.54; k+ 3.6; na++ 135; ca 8.3 gfr >60   TODAY   09-01-23: glucose 179; bun 31; creat 0.51; k+ 3.7; na++ 150; ca 8.5 gfr >60   Review of Systems  Constitutional:  Negative for malaise/fatigue.  Respiratory:  Negative for cough and shortness of breath.   Cardiovascular:  Negative for chest pain,  palpitations and leg swelling.  Gastrointestinal:  Negative for abdominal pain, constipation and heartburn.  Musculoskeletal:  Negative for back pain, joint pain and myalgias.  Skin: Negative.   Neurological:  Negative for dizziness.  Psychiatric/Behavioral:  The patient is not nervous/anxious.    Physical Exam Constitutional:      General: She is not in acute distress.    Appearance: She is well-developed. She is not diaphoretic.  HENT:     Ears:     Comments: HOH Neck:     Thyroid: No thyromegaly.  Cardiovascular:     Rate and Rhythm: Normal rate and regular rhythm.  Pulmonary:     Effort: Pulmonary effort is normal. No respiratory distress.     Breath sounds: Normal breath sounds.  Abdominal:     General: Bowel sounds are normal. There is no distension.     Palpations: Abdomen is soft.     Tenderness: There is no abdominal tenderness.     Comments: Left upper quad peg tube button    Musculoskeletal:        General: Normal range of motion.     Cervical back: Neck supple.     Right lower leg: No edema.     Left lower leg: No edema.  Lymphadenopathy:     Cervical: No cervical adenopathy.  Skin:    General: Skin is warm and dry.  Neurological:     Mental Status: She is alert and oriented to person, place, and time.  Psychiatric:  Mood and Affect: Mood normal.        ASSESSMENT/ PLAN:  TODAY  Hypernatremia Chronic diastolic congestive heart failure  Will return free water to 150 cc every 4 hours Will change lasix to 20 mg daily  Will recheck bmp on Thursday    Synthia Innocent NP [facility]  call [phone]

## 2023-09-02 ENCOUNTER — Non-Acute Institutional Stay (SKILLED_NURSING_FACILITY): Payer: Medicare Other | Admitting: Adult Health

## 2023-09-02 ENCOUNTER — Telehealth: Payer: Medicare Other | Admitting: Adult Health

## 2023-09-02 ENCOUNTER — Encounter: Payer: Self-pay | Admitting: Adult Health

## 2023-09-02 DIAGNOSIS — H8103 Meniere's disease, bilateral: Secondary | ICD-10-CM

## 2023-09-02 DIAGNOSIS — E870 Hyperosmolality and hypernatremia: Secondary | ICD-10-CM | POA: Insufficient documentation

## 2023-09-02 DIAGNOSIS — I5032 Chronic diastolic (congestive) heart failure: Secondary | ICD-10-CM | POA: Insufficient documentation

## 2023-09-02 DIAGNOSIS — H8110 Benign paroxysmal vertigo, unspecified ear: Secondary | ICD-10-CM | POA: Diagnosis not present

## 2023-09-02 NOTE — Progress Notes (Unsigned)
Location:  [facility] Room Number: 151 Place of Service:  SNF (31)   CODE STATUS: full   Allergies  Allergen Reactions   Gabapentin Nausea And Vomiting    hallucinations hallucinations   Other Nausea And Vomiting    Strong pain medications cause n/v Strong pain medications cause n/v   Morphine Nausea And Vomiting    Nausea & vomiting Nausea & vomiting    Chief Complaint  Patient presents with   Acute Visit    Nausea     HPI:  Today she is having nausea without vomiting. She has a history of meniere's disease and severe motion sickness. She can get sick by rolling down hallway with her wheelchair. Her family states that it is difficult for her ride in car. She uses zofran at home for her nausea. She has a history of visual disturbances; with blurred vision. She is being seen by her eye doctor on an outpatient basis.   Her repeat chest x-ray demonstrates continued mild CHF.   Past Medical History:  Diagnosis Date   Allergy    seasonal & dust   Anxiety    Arthritis    DJD   Cellulitis    Family history of adverse reaction to anesthesia    n/v    GERD (gastroesophageal reflux disease)    History of vertigo    Hyperlipidemia    Hypertension    Hypothyroidism    PONV (postoperative nausea and vomiting)    Thyroid disease    hypothyroidism post Hashimoto's & partial thyroidectomy    Past Surgical History:  Procedure Laterality Date   BIOPSY  01/11/2022   Procedure: BIOPSY;  Surgeon: Meridee Score Netty Starring., MD;  Location: [facility];  Service: Gastroenterology;;   CATARACT EXTRACTION W/ INTRAOCULAR LENS  IMPLANT, BILATERAL     COLONOSCOPY     diverticulosis   ESOPHAGOGASTRODUODENOSCOPY (EGD) WITH PROPOFOL N/A 01/11/2022   Procedure: ESOPHAGOGASTRODUODENOSCOPY (EGD) WITH PROPOFOL;  Surgeon: Lemar Lofty., MD;  Location: [facility];  Service: Gastroenterology;  Laterality: N/A;   GASTROSTOMY N/A 01/17/2022   Procedure: OPEN  GASTROSTOMY PLACEMENT;  Surgeon: Harriette Bouillon, MD;  Location: [facility];  Service: General;  Laterality: N/A;   IR GASTROSTOMY TUBE MOD SED  01/15/2022   IR REPLACE G-TUBE SIMPLE WO FLUORO  03/13/2023   IR REPLC GASTRO/COLONIC TUBE PERCUT W/FLUORO  02/20/2023   JOINT REPLACEMENT  1995 & 1999   THR bilaterally    lumbar disc repair  1989   Dr Mercy Riding   SAVORY DILATION N/A 01/11/2022   Procedure: Gaspar Bidding DILATION;  Surgeon: Lemar Lofty., MD;  Location: [facility];  Service: Gastroenterology;  Laterality: N/A;   SPINE SURGERY     cervical ; Dr Venetia Maxon   THYROIDECTOMY, PARTIAL     benign nodule   TONSILLECTOMY AND ADENOIDECTOMY     TOTAL SHOULDER ARTHROPLASTY Left 02/27/2016   Procedure: LEFT REVERSE TOTAL SHOULDER ARTHROPLASTY;  Surgeon: Sheral Apley, MD;  Location: [facility];  Service: Orthopedics;  Laterality: Left;   vitreous detachment     Dr Nelle Don    Social History   Socioeconomic History   Marital status: Married    Spouse name: Not on file   Number of children: 2   Years of education: Not on file   Highest education level: Not on file  Occupational History   Not on file  Tobacco Use   Smoking status: Never   Smokeless tobacco: Never  Vaping Use  Vaping status: Never Used  Substance and Sexual Activity   Alcohol use: No   Drug use: No   Sexual activity: Not on file  Other Topics Concern   Not on file  Social History Narrative   Married for 63 years    Daughter-nurse   Daughter- paralegal       Right handed   Social Determinants of Health   Financial Resource Strain: Low Risk  (02/04/2023)   Overall Financial Resource Strain (CARDIA)    Difficulty of Paying Living Expenses: Not hard at all  Food Insecurity: No Food Insecurity (08/22/2023)   Hunger Vital Sign    Worried About Running Out of Food in the Last Year: Never true    Ran Out of Food in the Last Year: Never true  Transportation Needs: No Transportation Needs (08/22/2023)   PRAPARE -  Administrator, Civil Service (Medical): No    Lack of Transportation (Non-Medical): No  Physical Activity: Sufficiently Active (02/04/2023)   Exercise Vital Sign    Days of Exercise per Week: 5 days    Minutes of Exercise per Session: 30 min  Stress: No Stress Concern Present (02/04/2023)   [employer] of Occupational Health - Occupational Stress Questionnaire    Feeling of Stress : Not at all  Social Connections: Socially Integrated (02/04/2023)   Social Connection and Isolation Panel [NHANES]    Frequency of Communication with Friends and Family: More than three times a week    Frequency of Social Gatherings with Friends and Family: More than three times a week    Attends Religious Services: More than 4 times per year    Active Member of [employer] or Organizations: Yes    Attends Engineer, structural: More than 4 times per year    Marital Status: Married  Catering manager Violence: Not At Risk (08/22/2023)   Humiliation, Afraid, Rape, and Kick questionnaire    Fear of Current or Ex-Partner: No    Emotionally Abused: No    Physically Abused: No    Sexually Abused: No   Family History  Problem Relation Age of Onset   Diabetes Mother    Hepatitis Mother        Hepatitis B,? etiology   Miscarriages / Stillbirths Mother    Stroke Mother 58   Heart disease Father        CHF   Cancer Paternal Aunt        GI cancers   Heart disease Paternal Aunt        CHF   Cancer Paternal Uncle        lung, GI   Heart disease Paternal Uncle        CHF   Colon cancer Neg Hx    Esophageal cancer Neg Hx    Pancreatic cancer Neg Hx    Rectal cancer Neg Hx    Stomach cancer Neg Hx       VITAL SIGNS BP (!) 118/56   Pulse 100   Temp 98 F (36.7 C)   Resp 20   Ht 5\' 5"  (1.651 m)   Wt 137 lb 12.8 oz (62.5 kg)   SpO2 98%   BMI 22.93 kg/m   Outpatient Encounter Medications as of 09/02/2023  Medication Sig Note   acetaminophen (TYLENOL) 500 MG tablet Take 500 mg by  mouth every 6 (six) hours as needed for moderate pain.    albuterol (VENTOLIN HFA) 108 (90 Base) MCG/ACT inhaler Inhale 2 puffs into the  lungs every 6 (six) hours as needed for wheezing or shortness of breath. 08/21/2023: Pt request spacer   esomeprazole (NEXIUM) 40 MG capsule PLACE 1 CAPSULE IN FEEDING TUBE TWICE DAILY.    FLUoxetine (PROZAC) 20 MG capsule TAKE 1 CAPSULE BY MOUTH DAILY.    fluticasone (FLONASE) 50 MCG/ACT nasal spray Place 1 spray into both nostrils as needed for allergies or rhinitis.    furosemide (LASIX) 20 MG tablet Take 20 mg by mouth daily.    levothyroxine (SYNTHROID) 112 MCG tablet TAKE ONE TABLET BY MOUTH ONCE DAILY.    loratadine (CLARITIN) 10 MG tablet Take 10 mg by mouth daily.    metoCLOPramide (REGLAN) 5 MG tablet Take 1 tablet (5 mg total) by mouth in the morning and at bedtime.    Nutritional Supplements (FEEDING SUPPLEMENT, JEVITY 1.5 CAL/FIBER,) LIQD Place 50 mL/hr into feeding tube continuous. Dose to be titrated per RD at home.    Nystatin (GERHARDT'S BUTT CREAM) CREA Apply 1 Application topically 3 (three) times daily.    nystatin (MYCOSTATIN/NYSTOP) powder Apply 1 Application topically 3 (three) times daily.    omeprazole (PRILOSEC) 40 MG capsule Take 40 mg by mouth 2 (two) times daily.    Polyvinyl Alcohol-Povidone (ARTIFICIAL TEARS) 5-6 MG/ML SOLN Place 1 drop into both eyes daily.    Protein (FEEDING SUPPLEMENT, PROSOURCE TF20,) liquid Place 60 mLs into feeding tube 2 (two) times daily.    Water For Irrigation, Sterile (FREE WATER) SOLN Place 150 mLs into feeding tube 6 (six) times daily.    No facility-administered encounter medications on file as of 09/02/2023.     SIGNIFICANT DIAGNOSTIC EXAMS  PREVIOUS  08-27-23: chest x-ray: mild chf   PREVIOUS   08-21-23: wbc 8.0; hgb 12.8; hct 40.1; mcv 98. 3plt 164; glucose 131; bun 28; creat 0.52; k+ 4.2; na++ 134; ca 8.3; gfr >60; protein 7.9; albumin 3.0; mag 2.0 08-22-23: tsh 0.742 08-24-23: glucose 156;  bun 26; creat 0.54; k+ 3.6; na++ 135; ca 8.3 gfr >60   TODAY   09-01-23: glucose 179; bun 31; creat 0.51; k+ 3.7; na++ 150; ca 8.5 gfr >60 BNP 58   Review of Systems  Constitutional:  Negative for malaise/fatigue.  Respiratory:  Negative for cough and shortness of breath.   Cardiovascular:  Negative for chest pain, palpitations and leg swelling.  Gastrointestinal:  Positive for nausea and vomiting. Negative for abdominal pain, constipation and heartburn.  Musculoskeletal:  Negative for back pain, joint pain and myalgias.  Skin: Negative.   Neurological:  Positive for dizziness.  Psychiatric/Behavioral:  The patient is not nervous/anxious.    Physical Exam Constitutional:      General: She is not in acute distress.    Appearance: She is well-developed. She is not diaphoretic.  Neck:     Thyroid: No thyromegaly.  Cardiovascular:     Rate and Rhythm: Normal rate and regular rhythm.     Pulses: Normal pulses.     Heart sounds: Normal heart sounds.  Pulmonary:     Effort: Pulmonary effort is normal. No respiratory distress.     Breath sounds: Normal breath sounds.  Abdominal:     General: Bowel sounds are normal. There is no distension.     Palpations: Abdomen is soft.     Tenderness: There is no abdominal tenderness.     Comments: Left upper quad peg tube button     Musculoskeletal:        General: Normal range of motion.     Cervical back:  Neck supple.     Right lower leg: No edema.     Left lower leg: No edema.  Lymphadenopathy:     Cervical: No cervical adenopathy.  Skin:    General: Skin is warm and dry.  Neurological:     Mental Status: She is alert and oriented to person, place, and time.  Psychiatric:        Mood and Affect: Mood normal.     ASSESSMENT/ PLAN:  TODAY  Meniere's disease bilateral Benign paroxsymal positional vertigo unspecified laterality  Chronic diastolic congestive heart failure   Will begin zofran 8 mg every 6 hours as needed with taking  once prior to therapy Meclizine 25 mg every 8 hours as needed for vertigo Will begin lasix extra 20 mg daily for 3 days.    Synthia Innocent NP [facility]   call [phone]

## 2023-09-02 NOTE — Telephone Encounter (Signed)
Nurse called to report CXR findings suggesting mild CHF. Pt had a 7 lb weight gain and sob.  She is not in distress. Sats are normal.  Increase lasix to 20 mg bid for 3 days then return to 20 mg daily Check BMP for K level in am

## 2023-09-03 DIAGNOSIS — H8109 Meniere's disease, unspecified ear: Secondary | ICD-10-CM | POA: Insufficient documentation

## 2023-09-04 ENCOUNTER — Other Ambulatory Visit (HOSPITAL_COMMUNITY)
Admission: RE | Admit: 2023-09-04 | Discharge: 2023-09-04 | Disposition: A | Discharge status: Home / Self Care | Payer: Medicare Other | Source: Skilled Nursing Facility | Attending: Adult Health | Admitting: Adult Health

## 2023-09-04 DIAGNOSIS — I10 Essential (primary) hypertension: Secondary | ICD-10-CM | POA: Insufficient documentation

## 2023-09-04 LAB — BASIC METABOLIC PANEL
Anion gap: 9 (ref 5–15)
BUN: 37 mg/dL — ABNORMAL HIGH (ref 8–23)
CO2: 42 mmol/L — ABNORMAL HIGH (ref 22–32)
Calcium: 8.4 mg/dL — ABNORMAL LOW (ref 8.9–10.3)
Chloride: 96 mmol/L — ABNORMAL LOW (ref 98–111)
Creatinine, Ser: 0.58 mg/dL (ref 0.44–1.00)
GFR, Estimated: >60 mL/min (ref >60)
Glucose, Bld: 199 mg/dL — ABNORMAL HIGH (ref 70–99)
Potassium: 3.5 mmol/L (ref 3.5–5.1)
Sodium: 147 mmol/L — ABNORMAL HIGH (ref 135–145)

## 2023-09-09 ENCOUNTER — Encounter: Payer: Self-pay | Admitting: Adult Health

## 2023-09-09 ENCOUNTER — Other Ambulatory Visit (HOSPITAL_COMMUNITY)
Admission: RE | Admit: 2023-09-09 | Discharge: 2023-09-09 | Disposition: A | Discharge status: Home / Self Care | Payer: Medicare Other | Source: Skilled Nursing Facility | Attending: Adult Health | Admitting: Adult Health

## 2023-09-09 ENCOUNTER — Non-Acute Institutional Stay (SKILLED_NURSING_FACILITY): Payer: Medicare Other | Admitting: Adult Health

## 2023-09-09 DIAGNOSIS — R0602 Shortness of breath: Secondary | ICD-10-CM | POA: Insufficient documentation

## 2023-09-09 DIAGNOSIS — I5032 Chronic diastolic (congestive) heart failure: Secondary | ICD-10-CM | POA: Diagnosis not present

## 2023-09-09 DIAGNOSIS — R635 Abnormal weight gain: Secondary | ICD-10-CM | POA: Insufficient documentation

## 2023-09-09 LAB — BASIC METABOLIC PANEL
Anion gap: 15 (ref 5–15)
BUN: 29 mg/dL — ABNORMAL HIGH (ref 8–23)
CO2: 35 mmol/L — ABNORMAL HIGH (ref 22–32)
Calcium: 7.8 mg/dL — ABNORMAL LOW (ref 8.9–10.3)
Chloride: 96 mmol/L — ABNORMAL LOW (ref 98–111)
Creatinine, Ser: 0.58 mg/dL (ref 0.44–1.00)
GFR, Estimated: >60 mL/min (ref >60)
Glucose, Bld: 147 mg/dL — ABNORMAL HIGH (ref 70–99)
Potassium: 2.9 mmol/L — ABNORMAL LOW (ref 3.5–5.1)
Sodium: 146 mmol/L — ABNORMAL HIGH (ref 135–145)

## 2023-09-09 LAB — BRAIN NATRIURETIC PEPTIDE: B Natriuretic Peptide: 75 pg/mL (ref 0.0–100.0)

## 2023-09-09 NOTE — Progress Notes (Unsigned)
Location:  [facility] Room Number: 151 Place of Service:  SNF (31)   CODE STATUS: full   Allergies  Allergen Reactions   Gabapentin Nausea And Vomiting    hallucinations hallucinations   Other Nausea And Vomiting    Strong pain medications cause n/v Strong pain medications cause n/v   Morphine Nausea And Vomiting    Nausea & vomiting Nausea & vomiting    Chief Complaint  Patient presents with   Acute Visit    Weight gain     HPI:  She has gained weight from 139.6 pounds yesterday to 144.2 pounds today. She does have bilateral pedal/ankle edema. No cough or shortness of breath. She continues with tube feeding daily with free water flushes   Past Medical History:  Diagnosis Date   Allergy    seasonal & dust   Anxiety    Arthritis    DJD   Cellulitis    Family history of adverse reaction to anesthesia    n/v    GERD (gastroesophageal reflux disease)    History of vertigo    Hyperlipidemia    Hypertension    Hypothyroidism    PONV (postoperative nausea and vomiting)    Thyroid disease    hypothyroidism post Hashimoto's & partial thyroidectomy    Past Surgical History:  Procedure Laterality Date   BIOPSY  01/11/2022   Procedure: BIOPSY;  Surgeon: Lemar Lofty., MD;  Location: [facility];  Service: Gastroenterology;;   CATARACT EXTRACTION W/ INTRAOCULAR LENS  IMPLANT, BILATERAL     COLONOSCOPY     diverticulosis   ESOPHAGOGASTRODUODENOSCOPY (EGD) WITH PROPOFOL N/A 01/11/2022   Procedure: ESOPHAGOGASTRODUODENOSCOPY (EGD) WITH PROPOFOL;  Surgeon: Lemar Lofty., MD;  Location: [facility];  Service: Gastroenterology;  Laterality: N/A;   GASTROSTOMY N/A 01/17/2022   Procedure: OPEN GASTROSTOMY PLACEMENT;  Surgeon: Harriette Bouillon, MD;  Location: [facility];  Service: General;  Laterality: N/A;   IR GASTROSTOMY TUBE MOD SED  01/15/2022   IR REPLACE G-TUBE SIMPLE WO FLUORO  03/13/2023   IR REPLC GASTRO/COLONIC TUBE PERCUT W/FLUORO   02/20/2023   JOINT REPLACEMENT  1995 & 1999   THR bilaterally    lumbar disc repair  1989   Dr Mercy Riding   SAVORY DILATION N/A 01/11/2022   Procedure: Gaspar Bidding DILATION;  Surgeon: Lemar Lofty., MD;  Location: [facility];  Service: Gastroenterology;  Laterality: N/A;   SPINE SURGERY     cervical ; Dr Venetia Maxon   THYROIDECTOMY, PARTIAL     benign nodule   TONSILLECTOMY AND ADENOIDECTOMY     TOTAL SHOULDER ARTHROPLASTY Left 02/27/2016   Procedure: LEFT REVERSE TOTAL SHOULDER ARTHROPLASTY;  Surgeon: Sheral Apley, MD;  Location: [facility];  Service: Orthopedics;  Laterality: Left;   vitreous detachment     Dr Nelle Don    Social History   Socioeconomic History   Marital status: Married    Spouse name: Not on file   Number of children: 2   Years of education: Not on file   Highest education level: Not on file  Occupational History   Not on file  Tobacco Use   Smoking status: Never   Smokeless tobacco: Never  Vaping Use   Vaping status: Never Used  Substance and Sexual Activity   Alcohol use: No   Drug use: No   Sexual activity: Not on file  Other Topics Concern   Not on file  Social History Narrative   Married for 63 years  Daughter-nurse   Daughter- paralegal       Right handed   Social Determinants of Health   Financial Resource Strain: Low Risk  (02/04/2023)   Overall Financial Resource Strain (CARDIA)    Difficulty of Paying Living Expenses: Not hard at all  Food Insecurity: No Food Insecurity (08/22/2023)   Hunger Vital Sign    Worried About Running Out of Food in the Last Year: Never true    Ran Out of Food in the Last Year: Never true  Transportation Needs: No Transportation Needs (08/22/2023)   PRAPARE - Administrator, Civil Service (Medical): No    Lack of Transportation (Non-Medical): No  Physical Activity: Sufficiently Active (02/04/2023)   Exercise Vital Sign    Days of Exercise per Week: 5 days    Minutes of Exercise per Session:  30 min  Stress: No Stress Concern Present (02/04/2023)   [employer] of Occupational Health - Occupational Stress Questionnaire    Feeling of Stress : Not at all  Social Connections: Socially Integrated (02/04/2023)   Social Connection and Isolation Panel [NHANES]    Frequency of Communication with Friends and Family: More than three times a week    Frequency of Social Gatherings with Friends and Family: More than three times a week    Attends Religious Services: More than 4 times per year    Active Member of [employer] or Organizations: Yes    Attends Banker Meetings: More than 4 times per year    Marital Status: Married  Catering manager Violence: Not At Risk (08/22/2023)   Humiliation, Afraid, Rape, and Kick questionnaire    Fear of Current or Ex-Partner: No    Emotionally Abused: No    Physically Abused: No    Sexually Abused: No   Family History  Problem Relation Age of Onset   Diabetes Mother    Hepatitis Mother        Hepatitis B,? etiology   Miscarriages / Stillbirths Mother    Stroke Mother 53   Heart disease Father        CHF   Cancer Paternal Aunt        GI cancers   Heart disease Paternal Aunt        CHF   Cancer Paternal Uncle        lung, GI   Heart disease Paternal Uncle        CHF   Colon cancer Neg Hx    Esophageal cancer Neg Hx    Pancreatic cancer Neg Hx    Rectal cancer Neg Hx    Stomach cancer Neg Hx       VITAL SIGNS BP 112/61   Pulse 88   Temp (!) 97.5 F (36.4 C)   Resp 20   Ht 5\' 5"  (1.651 m)   Wt 144 lb 3.2 oz (65.4 kg)   SpO2 96%   BMI 24.00 kg/m   Outpatient Encounter Medications as of 09/09/2023  Medication Sig Note   acetaminophen (TYLENOL) 500 MG tablet Take 500 mg by mouth every 6 (six) hours as needed for moderate pain.    albuterol (VENTOLIN HFA) 108 (90 Base) MCG/ACT inhaler Inhale 2 puffs into the lungs every 6 (six) hours as needed for wheezing or shortness of breath. 08/21/2023: Pt request spacer    esomeprazole (NEXIUM) 40 MG capsule PLACE 1 CAPSULE IN FEEDING TUBE TWICE DAILY.    FLUoxetine (PROZAC) 20 MG capsule TAKE 1 CAPSULE BY MOUTH DAILY.  fluticasone (FLONASE) 50 MCG/ACT nasal spray Place 1 spray into both nostrils as needed for allergies or rhinitis.    furosemide (LASIX) 20 MG tablet Take 20 mg by mouth daily.    levothyroxine (SYNTHROID) 112 MCG tablet TAKE ONE TABLET BY MOUTH ONCE DAILY.    loratadine (CLARITIN) 10 MG tablet Take 10 mg by mouth daily.    meclizine (ANTIVERT) 25 MG tablet Take 25 mg by mouth 3 (three) times daily as needed for dizziness.    metoCLOPramide (REGLAN) 5 MG tablet Take 1 tablet (5 mg total) by mouth in the morning and at bedtime.    Nutritional Supplements (FEEDING SUPPLEMENT, JEVITY 1.5 CAL/FIBER,) LIQD Place 50 mL/hr into feeding tube continuous. Dose to be titrated per RD at home.    Nystatin (GERHARDT'S BUTT CREAM) CREA Apply 1 Application topically 3 (three) times daily.    nystatin (MYCOSTATIN/NYSTOP) powder Apply 1 Application topically 3 (three) times daily.    omeprazole (PRILOSEC) 40 MG capsule Take 40 mg by mouth 2 (two) times daily.    ondansetron (ZOFRAN-ODT) 8 MG disintegrating tablet Take 8 mg by mouth every 6 (six) hours as needed for nausea or vomiting.    Polyvinyl Alcohol-Povidone (ARTIFICIAL TEARS) 5-6 MG/ML SOLN Place 1 drop into both eyes daily.    Protein (FEEDING SUPPLEMENT, PROSOURCE TF20,) liquid Place 60 mLs into feeding tube 2 (two) times daily.    Water For Irrigation, Sterile (FREE WATER) SOLN Place 150 mLs into feeding tube 6 (six) times daily.    No facility-administered encounter medications on file as of 09/09/2023.     SIGNIFICANT DIAGNOSTIC EXAMS  PREVIOUS  08-27-23: chest x-ray: mild chf   PREVIOUS   08-21-23: wbc 8.0; hgb 12.8; hct 40.1; mcv 98. 3plt 164; glucose 131; bun 28; creat 0.52; k+ 4.2; na++ 134; ca 8.3; gfr >60; protein 7.9; albumin 3.0; mag 2.0 08-22-23: tsh 0.742 08-24-23: glucose 156; bun 26;  creat 0.54; k+ 3.6; na++ 135; ca 8.3 gfr >60  09-01-23: glucose 179; bun 31; creat 0.51; k+ 3.7; na++ 150; ca 8.5 gfr >60 BNP 58   TODAY  09-04-23: glucose 199; bun 37; creat 0.58; k+ 3.5; na++ 147 ca 8.4 gfr >60 09-09-23: glucose 147; bun 29; creat 0.58; k+ 2.9; na++ 146; ca 7.8 gfr >60  Review of Systems  Constitutional:  Negative for malaise/fatigue.  Respiratory:  Negative for cough and shortness of breath.   Cardiovascular:  Positive for leg swelling. Negative for chest pain and palpitations.  Gastrointestinal:  Negative for abdominal pain, constipation and heartburn.  Musculoskeletal:  Negative for back pain, joint pain and myalgias.  Skin: Negative.   Neurological:  Negative for dizziness.  Psychiatric/Behavioral:  The patient is not nervous/anxious.     Physical Exam Constitutional:      General: She is not in acute distress.    Appearance: She is well-developed. She is not diaphoretic.  Neck:     Thyroid: No thyromegaly.  Cardiovascular:     Rate and Rhythm: Normal rate and regular rhythm.     Pulses: Normal pulses.     Heart sounds: Normal heart sounds.  Pulmonary:     Effort: Pulmonary effort is normal. No respiratory distress.     Breath sounds: Rhonchi present.  Abdominal:     General: Bowel sounds are normal. There is no distension.     Palpations: Abdomen is soft.     Tenderness: There is no abdominal tenderness.     Comments: Left upper quad peg tube button  Musculoskeletal:        General: Normal range of motion.     Cervical back: Neck supple.     Right lower leg: Edema present.     Left lower leg: Edema present.  Lymphadenopathy:     Cervical: No cervical adenopathy.  Skin:    General: Skin is warm and dry.  Neurological:     Mental Status: She is alert. Mental status is at baseline.  Psychiatric:        Mood and Affect: Mood normal.       ASSESSMENT/ PLAN:  TODAY  Chronic diastolic congestive heart failure: will give an extra lasix 20 mg  daily through 09-11-23. Will check bmp/bnp today and repeat lab on 09-15-23.   Synthia Innocent NP [facility]  call [phone]

## 2023-09-10 ENCOUNTER — Encounter: Payer: Self-pay | Admitting: Adult Health

## 2023-09-10 ENCOUNTER — Other Ambulatory Visit (HOSPITAL_COMMUNITY): Payer: Self-pay | Admitting: Adult Health

## 2023-09-10 ENCOUNTER — Other Ambulatory Visit (HOSPITAL_COMMUNITY)
Admission: RE | Admit: 2023-09-10 | Discharge: 2023-09-10 | Disposition: A | Discharge status: Home / Self Care | Payer: Medicare Other | Source: Skilled Nursing Facility | Attending: Adult Health | Admitting: Adult Health

## 2023-09-10 ENCOUNTER — Non-Acute Institutional Stay (SKILLED_NURSING_FACILITY): Payer: Medicare Other | Admitting: Adult Health

## 2023-09-10 DIAGNOSIS — E876 Hypokalemia: Secondary | ICD-10-CM

## 2023-09-10 DIAGNOSIS — I5020 Unspecified systolic (congestive) heart failure: Secondary | ICD-10-CM | POA: Insufficient documentation

## 2023-09-10 LAB — BASIC METABOLIC PANEL
Anion gap: 11 (ref 5–15)
BUN: 30 mg/dL — ABNORMAL HIGH (ref 8–23)
CO2: 38 mmol/L — ABNORMAL HIGH (ref 22–32)
Calcium: 7.8 mg/dL — ABNORMAL LOW (ref 8.9–10.3)
Chloride: 96 mmol/L — ABNORMAL LOW (ref 98–111)
Creatinine, Ser: 0.51 mg/dL (ref 0.44–1.00)
GFR, Estimated: >60 mL/min (ref >60)
Glucose, Bld: 204 mg/dL — ABNORMAL HIGH (ref 70–99)
Potassium: 3 mmol/L — ABNORMAL LOW (ref 3.5–5.1)
Sodium: 145 mmol/L (ref 135–145)

## 2023-09-10 NOTE — Progress Notes (Unsigned)
Location:  [facility] Room Number: 151 Place of Service:  SNF (31)   CODE STATUS: full   Allergies  Allergen Reactions   Gabapentin Nausea And Vomiting    hallucinations hallucinations   Other Nausea And Vomiting    Strong pain medications cause n/v Strong pain medications cause n/v   Morphine Nausea And Vomiting    Nausea & vomiting Nausea & vomiting    Chief Complaint  Patient presents with   Acute Visit    Follow up lab work     HPI:  Her k+ level today is 3.0; she took 80 meq yesterday.  Her calcium level is low at 7.8  she continues with peg tube feedings. There are no reports of uncontrolled pain. She continues with tube feedings. She has chronic vertigo; meniere's disease.   Past Medical History:  Diagnosis Date   Allergy    seasonal & dust   Anxiety    Arthritis    DJD   Cellulitis    Family history of adverse reaction to anesthesia    n/v    GERD (gastroesophageal reflux disease)    History of vertigo    Hyperlipidemia    Hypertension    Hypothyroidism    PONV (postoperative nausea and vomiting)    Thyroid disease    hypothyroidism post Hashimoto's & partial thyroidectomy    Past Surgical History:  Procedure Laterality Date   BIOPSY  01/11/2022   Procedure: BIOPSY;  Surgeon: Meridee Score Netty Starring., MD;  Location: [facility];  Service: Gastroenterology;;   CATARACT EXTRACTION W/ INTRAOCULAR LENS  IMPLANT, BILATERAL     COLONOSCOPY     diverticulosis   ESOPHAGOGASTRODUODENOSCOPY (EGD) WITH PROPOFOL N/A 01/11/2022   Procedure: ESOPHAGOGASTRODUODENOSCOPY (EGD) WITH PROPOFOL;  Surgeon: Lemar Lofty., MD;  Location: [facility];  Service: Gastroenterology;  Laterality: N/A;   GASTROSTOMY N/A 01/17/2022   Procedure: OPEN GASTROSTOMY PLACEMENT;  Surgeon: Harriette Bouillon, MD;  Location: [facility];  Service: General;  Laterality: N/A;   IR GASTROSTOMY TUBE MOD SED  01/15/2022   IR REPLACE G-TUBE SIMPLE WO FLUORO  03/13/2023   IR  REPLC GASTRO/COLONIC TUBE PERCUT W/FLUORO  02/20/2023   JOINT REPLACEMENT  1995 & 1999   THR bilaterally    lumbar disc repair  1989   Dr Mercy Riding   SAVORY DILATION N/A 01/11/2022   Procedure: Gaspar Bidding DILATION;  Surgeon: Lemar Lofty., MD;  Location: [facility];  Service: Gastroenterology;  Laterality: N/A;   SPINE SURGERY     cervical ; Dr Venetia Maxon   THYROIDECTOMY, PARTIAL     benign nodule   TONSILLECTOMY AND ADENOIDECTOMY     TOTAL SHOULDER ARTHROPLASTY Left 02/27/2016   Procedure: LEFT REVERSE TOTAL SHOULDER ARTHROPLASTY;  Surgeon: Sheral Apley, MD;  Location: [facility];  Service: Orthopedics;  Laterality: Left;   vitreous detachment     Dr Nelle Don    Social History   Socioeconomic History   Marital status: Married    Spouse name: Not on file   Number of children: 2   Years of education: Not on file   Highest education level: Not on file  Occupational History   Not on file  Tobacco Use   Smoking status: Never   Smokeless tobacco: Never  Vaping Use   Vaping status: Never Used  Substance and Sexual Activity   Alcohol use: No   Drug use: No   Sexual activity: Not on file  Other Topics Concern   Not on  file  Social History Narrative   Married for 63 years    Daughter-nurse   Daughter- paralegal       Right handed   Social Determinants of Health   Financial Resource Strain: Low Risk  (02/04/2023)   Overall Financial Resource Strain (CARDIA)    Difficulty of Paying Living Expenses: Not hard at all  Food Insecurity: No Food Insecurity (08/22/2023)   Hunger Vital Sign    Worried About Running Out of Food in the Last Year: Never true    Ran Out of Food in the Last Year: Never true  Transportation Needs: No Transportation Needs (08/22/2023)   PRAPARE - Administrator, Civil Service (Medical): No    Lack of Transportation (Non-Medical): No  Physical Activity: Sufficiently Active (02/04/2023)   Exercise Vital Sign    Days of Exercise per Week:  5 days    Minutes of Exercise per Session: 30 min  Stress: No Stress Concern Present (02/04/2023)   [employer] of Occupational Health - Occupational Stress Questionnaire    Feeling of Stress : Not at all  Social Connections: Socially Integrated (02/04/2023)   Social Connection and Isolation Panel [NHANES]    Frequency of Communication with Friends and Family: More than three times a week    Frequency of Social Gatherings with Friends and Family: More than three times a week    Attends Religious Services: More than 4 times per year    Active Member of [employer] or Organizations: Yes    Attends Engineer, structural: More than 4 times per year    Marital Status: Married  Catering manager Violence: Not At Risk (08/22/2023)   Humiliation, Afraid, Rape, and Kick questionnaire    Fear of Current or Ex-Partner: No    Emotionally Abused: No    Physically Abused: No    Sexually Abused: No   Family History  Problem Relation Age of Onset   Diabetes Mother    Hepatitis Mother        Hepatitis B,? etiology   Miscarriages / Stillbirths Mother    Stroke Mother 49   Heart disease Father        CHF   Cancer Paternal Aunt        GI cancers   Heart disease Paternal Aunt        CHF   Cancer Paternal Uncle        lung, GI   Heart disease Paternal Uncle        CHF   Colon cancer Neg Hx    Esophageal cancer Neg Hx    Pancreatic cancer Neg Hx    Rectal cancer Neg Hx    Stomach cancer Neg Hx       VITAL SIGNS BP 128/76   Pulse 72   Temp 98.3 F (36.8 C)   Resp 16   Ht 5\' 5"  (1.651 m)   Wt 144 lb 3.2 oz (65.4 kg)   SpO2 95%   BMI 24.00 kg/m   Outpatient Encounter Medications as of 09/10/2023  Medication Sig Note   acetaminophen (TYLENOL) 500 MG tablet Take 500 mg by mouth every 6 (six) hours as needed for moderate pain.    albuterol (VENTOLIN HFA) 108 (90 Base) MCG/ACT inhaler Inhale 2 puffs into the lungs every 6 (six) hours as needed for wheezing or shortness of breath.  08/21/2023: Pt request spacer   esomeprazole (NEXIUM) 40 MG capsule PLACE 1 CAPSULE IN FEEDING TUBE TWICE DAILY.  FLUoxetine (PROZAC) 20 MG capsule TAKE 1 CAPSULE BY MOUTH DAILY.    fluticasone (FLONASE) 50 MCG/ACT nasal spray Place 1 spray into both nostrils as needed for allergies or rhinitis.    furosemide (LASIX) 20 MG tablet Take 20 mg by mouth daily.    levothyroxine (SYNTHROID) 112 MCG tablet TAKE ONE TABLET BY MOUTH ONCE DAILY.    loratadine (CLARITIN) 10 MG tablet Take 10 mg by mouth daily.    meclizine (ANTIVERT) 25 MG tablet Take 25 mg by mouth 3 (three) times daily as needed for dizziness.    metoCLOPramide (REGLAN) 5 MG tablet Take 1 tablet (5 mg total) by mouth in the morning and at bedtime.    Nutritional Supplements (FEEDING SUPPLEMENT, JEVITY 1.5 CAL/FIBER,) LIQD Place 50 mL/hr into feeding tube continuous. Dose to be titrated per RD at home.    Nystatin (GERHARDT'S BUTT CREAM) CREA Apply 1 Application topically 3 (three) times daily.    nystatin (MYCOSTATIN/NYSTOP) powder Apply 1 Application topically 3 (three) times daily.    omeprazole (PRILOSEC) 40 MG capsule Take 40 mg by mouth 2 (two) times daily.    ondansetron (ZOFRAN-ODT) 8 MG disintegrating tablet Take 8 mg by mouth every 6 (six) hours as needed for nausea or vomiting.    Polyvinyl Alcohol-Povidone (ARTIFICIAL TEARS) 5-6 MG/ML SOLN Place 1 drop into both eyes daily.    Protein (FEEDING SUPPLEMENT, PROSOURCE TF20,) liquid Place 60 mLs into feeding tube 2 (two) times daily.    Water For Irrigation, Sterile (FREE WATER) SOLN Place 150 mLs into feeding tube 6 (six) times daily.    No facility-administered encounter medications on file as of 09/10/2023.     SIGNIFICANT DIAGNOSTIC EXAMS  PREVIOUS  08-27-23: chest x-ray: mild chf   PREVIOUS   08-21-23: wbc 8.0; hgb 12.8; hct 40.1; mcv 98. 3plt 164; glucose 131; bun 28; creat 0.52; k+ 4.2; na++ 134; ca 8.3; gfr >60; protein 7.9; albumin 3.0; mag 2.0 08-22-23: tsh  0.742 08-24-23: glucose 156; bun 26; creat 0.54; k+ 3.6; na++ 135; ca 8.3 gfr >60  09-01-23: glucose 179; bun 31; creat 0.51; k+ 3.7; na++ 150; ca 8.5 gfr >60 BNP 58  09-04-23: glucose 199; bun 37; creat 0.58; k+ 3.5; na++ 147 ca 8.4 gfr >60 09-09-23: glucose 147; bun 29; creat 0.58; k+ 2.9; na++ 146; ca 7.8 gfr >60  TODAY  09-10-23: glucose 204; bun 30; creat 0.51; k+ 3.0; na++ 145; ca 7.8; gfr >60   Review of Systems  Constitutional:  Negative for malaise/fatigue.  Respiratory:  Negative for cough and shortness of breath.   Cardiovascular:  Negative for chest pain, palpitations and leg swelling.  Gastrointestinal:  Negative for abdominal pain, constipation and heartburn.  Musculoskeletal:  Negative for back pain, joint pain and myalgias.  Skin: Negative.   Neurological:  Negative for dizziness.  Psychiatric/Behavioral:  The patient is not nervous/anxious.     Physical Exam Constitutional:      General: She is not in acute distress.    Appearance: She is well-developed. She is not diaphoretic.  Neck:     Thyroid: No thyromegaly.  Cardiovascular:     Rate and Rhythm: Normal rate and regular rhythm.     Pulses: Normal pulses.     Heart sounds: Normal heart sounds.  Pulmonary:     Effort: Pulmonary effort is normal. No respiratory distress.     Breath sounds: Normal breath sounds.  Abdominal:     General: Bowel sounds are normal. There is no distension.  Palpations: Abdomen is soft.     Tenderness: There is no abdominal tenderness.     Comments:  Left upper quad peg tube button     Musculoskeletal:        General: Normal range of motion.     Cervical back: Neck supple.     Right lower leg: No edema.     Left lower leg: No edema.  Lymphadenopathy:     Cervical: No cervical adenopathy.  Skin:    General: Skin is warm and dry.  Neurological:     Mental Status: She is alert. Mental status is at baseline.  Psychiatric:        Mood and Affect: Mood normal.        ASSESSMENT/ PLAN:  TODAY  Hypokalemia Hypocalcemia  Will begin k+ 40 meq twice daily Will begin calcium 500 mg daily Will repeat labs on 09-15-23.    Synthia Innocent NP [facility]   call [phone]

## 2023-09-11 DIAGNOSIS — E876 Hypokalemia: Secondary | ICD-10-CM | POA: Insufficient documentation

## 2023-09-12 ENCOUNTER — Encounter: Payer: Self-pay | Admitting: Adult Health

## 2023-09-12 ENCOUNTER — Other Ambulatory Visit (HOSPITAL_COMMUNITY): Payer: Medicare Other

## 2023-09-12 ENCOUNTER — Non-Acute Institutional Stay (SKILLED_NURSING_FACILITY): Payer: Medicare Other | Admitting: Adult Health

## 2023-09-12 DIAGNOSIS — I5032 Chronic diastolic (congestive) heart failure: Secondary | ICD-10-CM

## 2023-09-12 DIAGNOSIS — Z931 Gastrostomy status: Secondary | ICD-10-CM

## 2023-09-12 DIAGNOSIS — I700 Atherosclerosis of aorta: Secondary | ICD-10-CM

## 2023-09-12 NOTE — Telephone Encounter (Signed)
Noted.  Will plan on resuming care if and when she is able to return home.

## 2023-09-12 NOTE — Telephone Encounter (Signed)
Called and spoke with Daughter to clarify who is doing Tasha Moss main care as daughter is under the impression [facility] Moss cares for her while she is in the nursing center, however there is confusion on whether or not [facility] Moss will be continuing care.  They will be calling back once they have clarification from the Nursing home administrator

## 2023-09-12 NOTE — Telephone Encounter (Signed)
Spoke again with Daughter [name] she notes we are not responsible for her care while [facility] is a resident at [facility] however if she was to recover and be discharged in 2 months or so we would be expected to care for her.   Daughter [name] notes she needs you to fill out her FMLA paperwork for her to be able to take Lilyanah to specialist appts or if she is sick so she can be with her.  Notes deadline is 09/29/23

## 2023-09-12 NOTE — Progress Notes (Signed)
Location:  [facility] Room Number: 151 Place of Service:  SNF (31)   CODE STATUS: full   Allergies  Allergen Reactions   Gabapentin Nausea And Vomiting    hallucinations hallucinations   Other Nausea And Vomiting    Strong pain medications cause n/v Strong pain medications cause n/v   Morphine Nausea And Vomiting    Nausea & vomiting Nausea & vomiting    Chief Complaint  Patient presents with   Acute Visit    Care plan meeting     HPI:  We have come together for her care plan meeting. Family present. BIMS 15/15 mood 6/30: decreased energy, trouble concentrating, nervous at times; feels sad. She is using wheelchair without falls. She requires maximum to dependent for her adls. She is  incontinent of bladder and frequently incontinent of  bowel. Dietary: NPO on tube feeding. Jevity 1.5 from 4p-10a at 65 cc per hour; weight is 144 pounds . Therapy: min assist upper body max assist lower body; brp max assist; no steps; mod assist for transfers bed mobility min to mod; ambulates with 20 feet with walker and moderate assist; her abilities are inconsistent. Activities: sociable in room. She continues to be followed for her chronic illnesses including:    Aortic atherosclerosis    Chronic diastolic congestive heart failure   Feeding by g tube.  Past Medical History:  Diagnosis Date   Allergy    seasonal & dust   Anxiety    Arthritis    DJD   Cellulitis    Family history of adverse reaction to anesthesia    n/v    GERD (gastroesophageal reflux disease)    History of vertigo    Hyperlipidemia    Hypertension    Hypothyroidism    PONV (postoperative nausea and vomiting)    Thyroid disease    hypothyroidism post Hashimoto's & partial thyroidectomy    Past Surgical History:  Procedure Laterality Date   BIOPSY  01/11/2022   Procedure: BIOPSY;  Surgeon: Meridee Score Netty Starring., MD;  Location: [facility];  Service: Gastroenterology;;   CATARACT  EXTRACTION W/ INTRAOCULAR LENS  IMPLANT, BILATERAL     COLONOSCOPY     diverticulosis   ESOPHAGOGASTRODUODENOSCOPY (EGD) WITH PROPOFOL N/A 01/11/2022   Procedure: ESOPHAGOGASTRODUODENOSCOPY (EGD) WITH PROPOFOL;  Surgeon: Lemar Lofty., MD;  Location: [facility];  Service: Gastroenterology;  Laterality: N/A;   GASTROSTOMY N/A 01/17/2022   Procedure: OPEN GASTROSTOMY PLACEMENT;  Surgeon: Harriette Bouillon, MD;  Location: [facility];  Service: General;  Laterality: N/A;   IR GASTROSTOMY TUBE MOD SED  01/15/2022   IR REPLACE G-TUBE SIMPLE WO FLUORO  03/13/2023   IR REPLC GASTRO/COLONIC TUBE PERCUT W/FLUORO  02/20/2023   JOINT REPLACEMENT  1995 & 1999   THR bilaterally    lumbar disc repair  1989   Dr Mercy Riding   SAVORY DILATION N/A 01/11/2022   Procedure: Gaspar Bidding DILATION;  Surgeon: Lemar Lofty., MD;  Location: [facility];  Service: Gastroenterology;  Laterality: N/A;   SPINE SURGERY     cervical ; Dr Venetia Maxon   THYROIDECTOMY, PARTIAL     benign nodule   TONSILLECTOMY AND ADENOIDECTOMY     TOTAL SHOULDER ARTHROPLASTY Left 02/27/2016   Procedure: LEFT REVERSE TOTAL SHOULDER ARTHROPLASTY;  Surgeon: Sheral Apley, MD;  Location: [facility];  Service: Orthopedics;  Laterality: Left;   vitreous detachment     Dr Nelle Don    Social History   Socioeconomic History   Marital  status: Married    Spouse name: Not on file   Number of children: 2   Years of education: Not on file   Highest education level: Not on file  Occupational History   Not on file  Tobacco Use   Smoking status: Never   Smokeless tobacco: Never  Vaping Use   Vaping status: Never Used  Substance and Sexual Activity   Alcohol use: No   Drug use: No   Sexual activity: Not on file  Other Topics Concern   Not on file  Social History Narrative   Married for 63 years    Daughter-nurse   Daughter- paralegal       Right handed   Social Determinants of Health   Financial Resource Strain: Low Risk   (02/04/2023)   Overall Financial Resource Strain (CARDIA)    Difficulty of Paying Living Expenses: Not hard at all  Food Insecurity: No Food Insecurity (08/22/2023)   Hunger Vital Sign    Worried About Running Out of Food in the Last Year: Never true    Ran Out of Food in the Last Year: Never true  Transportation Needs: No Transportation Needs (08/22/2023)   PRAPARE - Administrator, Civil Service (Medical): No    Lack of Transportation (Non-Medical): No  Physical Activity: Sufficiently Active (02/04/2023)   Exercise Vital Sign    Days of Exercise per Week: 5 days    Minutes of Exercise per Session: 30 min  Stress: No Stress Concern Present (02/04/2023)   [employer] of Occupational Health - Occupational Stress Questionnaire    Feeling of Stress : Not at all  Social Connections: Socially Integrated (02/04/2023)   Social Connection and Isolation Panel [NHANES]    Frequency of Communication with Friends and Family: More than three times a week    Frequency of Social Gatherings with Friends and Family: More than three times a week    Attends Religious Services: More than 4 times per year    Active Member of [employer] or Organizations: Yes    Attends Engineer, structural: More than 4 times per year    Marital Status: Married  Catering manager Violence: Not At Risk (08/22/2023)   Humiliation, Afraid, Rape, and Kick questionnaire    Fear of Current or Ex-Partner: No    Emotionally Abused: No    Physically Abused: No    Sexually Abused: No   Family History  Problem Relation Age of Onset   Diabetes Mother    Hepatitis Mother        Hepatitis B,? etiology   Miscarriages / Stillbirths Mother    Stroke Mother 34   Heart disease Father        CHF   Cancer Paternal Aunt        GI cancers   Heart disease Paternal Aunt        CHF   Cancer Paternal Uncle        lung, GI   Heart disease Paternal Uncle        CHF   Colon cancer Neg Hx    Esophageal cancer Neg Hx     Pancreatic cancer Neg Hx    Rectal cancer Neg Hx    Stomach cancer Neg Hx       VITAL SIGNS BP 112/63   Pulse 80   Temp 97.8 F (36.6 C)   Resp 16   Ht 5\' 5"  (1.651 m)   Wt 144 lb 3.2 oz (65.4 kg)  SpO2 92%   BMI 24.00 kg/m   Outpatient Encounter Medications as of 09/12/2023  Medication Sig Note   acetaminophen (TYLENOL) 500 MG tablet Take 500 mg by mouth every 6 (six) hours as needed for moderate pain.    albuterol (VENTOLIN HFA) 108 (90 Base) MCG/ACT inhaler Inhale 2 puffs into the lungs every 6 (six) hours as needed for wheezing or shortness of breath. 08/21/2023: Pt request spacer   calcium carbonate (TUMS - DOSED IN MG ELEMENTAL CALCIUM) 500 MG chewable tablet Chew 1 tablet by mouth daily.    FLUoxetine (PROZAC) 20 MG capsule TAKE 1 CAPSULE BY MOUTH DAILY.    fluticasone (FLONASE) 50 MCG/ACT nasal spray Place 1 spray into both nostrils as needed for allergies or rhinitis.    furosemide (LASIX) 20 MG tablet Take 20 mg by mouth daily.    levothyroxine (SYNTHROID) 112 MCG tablet TAKE ONE TABLET BY MOUTH ONCE DAILY.    loratadine (CLARITIN) 10 MG tablet Take 10 mg by mouth daily.    meclizine (ANTIVERT) 25 MG tablet Take 25 mg by mouth 3 (three) times daily as needed for dizziness.    metoCLOPramide (REGLAN) 5 MG tablet Take 1 tablet (5 mg total) by mouth in the morning and at bedtime.    Nutritional Supplements (FEEDING SUPPLEMENT, JEVITY 1.5 CAL/FIBER,) LIQD Place 65 mL/hr into feeding tube continuous. From 4 PM to 10 AM    Nystatin (GERHARDT'S BUTT CREAM) CREA Apply 1 Application topically 3 (three) times daily.    nystatin (MYCOSTATIN/NYSTOP) powder Apply 1 Application topically 3 (three) times daily.    omeprazole (PRILOSEC) 40 MG capsule Take 40 mg by mouth 2 (two) times daily.    ondansetron (ZOFRAN-ODT) 8 MG disintegrating tablet Take 8 mg by mouth every 6 (six) hours as needed for nausea or vomiting. Also give prior to therapy    Polyvinyl Alcohol-Povidone (ARTIFICIAL  TEARS) 5-6 MG/ML SOLN Place 1 drop into both eyes daily.    potassium chloride (MICRO-K) 10 MEQ CR capsule Take 40 mEq by mouth 2 (two) times daily.    Protein (FEEDING SUPPLEMENT, PROSOURCE TF20,) liquid Place 60 mLs into feeding tube 2 (two) times daily.    Water For Irrigation, Sterile (STERILE WATER FOR IRRIGATION) Irrigate with 100 mLs as directed every 4 (four) hours. Free water    No facility-administered encounter medications on file as of 09/12/2023.     SIGNIFICANT DIAGNOSTIC EXAMS  PREVIOUS  08-27-23: chest x-ray: mild chf   PREVIOUS   08-21-23: wbc 8.0; hgb 12.8; hct 40.1; mcv 98. 3plt 164; glucose 131; bun 28; creat 0.52; k+ 4.2; na++ 134; ca 8.3; gfr >60; protein 7.9; albumin 3.0; mag 2.0 08-22-23: tsh 0.742 08-24-23: glucose 156; bun 26; creat 0.54; k+ 3.6; na++ 135; ca 8.3 gfr >60  09-01-23: glucose 179; bun 31; creat 0.51; k+ 3.7; na++ 150; ca 8.5 gfr >60 BNP 58  09-04-23: glucose 199; bun 37; creat 0.58; k+ 3.5; na++ 147 ca 8.4 gfr >60 09-09-23: glucose 147; bun 29; creat 0.58; k+ 2.9; na++ 146; ca 7.8 gfr >60 09-10-23: glucose 204; bun 30; creat 0.51; k+ 3.0; na++ 145; ca 7.8; gfr >60  NO NEW LABS.   Review of Systems  Constitutional:  Negative for malaise/fatigue.  Respiratory:  Negative for cough and shortness of breath.   Cardiovascular:  Negative for chest pain, palpitations and leg swelling.  Gastrointestinal:  Negative for abdominal pain, constipation and heartburn.  Musculoskeletal:  Negative for back pain, joint pain and myalgias.  Skin: Negative.  Neurological:  Negative for dizziness.  Psychiatric/Behavioral:  The patient is not nervous/anxious.     Physical Exam Constitutional:      General: She is not in acute distress.    Appearance: She is well-developed. She is not diaphoretic.  Neck:     Thyroid: No thyromegaly.  Cardiovascular:     Rate and Rhythm: Normal rate and regular rhythm.     Pulses: Normal pulses.     Heart sounds: Normal heart  sounds.  Pulmonary:     Effort: Pulmonary effort is normal. No respiratory distress.     Breath sounds: Normal breath sounds.  Abdominal:     General: Bowel sounds are normal. There is no distension.     Palpations: Abdomen is soft.     Tenderness: There is no abdominal tenderness.     Comments:  Left upper quad peg tube button      Musculoskeletal:        General: Normal range of motion.     Cervical back: Neck supple.     Right lower leg: No edema.     Left lower leg: No edema.     Comments: Neck contracture   Lymphadenopathy:     Cervical: No cervical adenopathy.  Skin:    General: Skin is warm and dry.  Neurological:     Mental Status: She is alert and oriented to person, place, and time.     Comments: Has bilateral hand tremors   Psychiatric:        Mood and Affect: Mood normal.       ASSESSMENT/ PLAN:  TODAY  Aortic atherosclerosis Chronic diastolic congestive heart failure Feeding by g tube   Will continue current medications Will continue therapy as directed  The goals of care: long term care  Will have ST evaluate for pleasure nectar thick liquids  MOST Form given to family to fill out; CPR has been discussed  Time spent with patient: 40 minutes: therapy; activities; dietary.    Synthia Innocent NP [facility]   call [phone]

## 2023-09-15 ENCOUNTER — Other Ambulatory Visit (HOSPITAL_COMMUNITY)
Admission: RE | Admit: 2023-09-15 | Discharge: 2023-09-15 | Disposition: A | Discharge status: Home / Self Care | Payer: Medicare Other | Source: Skilled Nursing Facility | Attending: Adult Health | Admitting: Adult Health

## 2023-09-15 DIAGNOSIS — I5020 Unspecified systolic (congestive) heart failure: Secondary | ICD-10-CM | POA: Insufficient documentation

## 2023-09-15 LAB — BASIC METABOLIC PANEL
Anion gap: 9 (ref 5–15)
BUN: 27 mg/dL — ABNORMAL HIGH (ref 8–23)
CO2: 33 mmol/L — ABNORMAL HIGH (ref 22–32)
Calcium: 8 mg/dL — ABNORMAL LOW (ref 8.9–10.3)
Chloride: 102 mmol/L (ref 98–111)
Creatinine, Ser: 0.48 mg/dL (ref 0.44–1.00)
GFR, Estimated: >60 mL/min (ref >60)
Glucose, Bld: 162 mg/dL — ABNORMAL HIGH (ref 70–99)
Potassium: 4.3 mmol/L (ref 3.5–5.1)
Sodium: 144 mmol/L (ref 135–145)

## 2023-09-15 LAB — BRAIN NATRIURETIC PEPTIDE: B Natriuretic Peptide: 45 pg/mL (ref 0.0–100.0)

## 2023-09-16 DIAGNOSIS — L84 Corns and callosities: Secondary | ICD-10-CM | POA: Diagnosis not present

## 2023-09-16 DIAGNOSIS — L603 Nail dystrophy: Secondary | ICD-10-CM | POA: Diagnosis not present

## 2023-09-16 DIAGNOSIS — I739 Peripheral vascular disease, unspecified: Secondary | ICD-10-CM | POA: Diagnosis not present

## 2023-09-16 DIAGNOSIS — L602 Onychogryphosis: Secondary | ICD-10-CM | POA: Diagnosis not present

## 2023-09-17 ENCOUNTER — Other Ambulatory Visit (HOSPITAL_COMMUNITY): Payer: Self-pay | Admitting: Occupational Therapy

## 2023-09-22 ENCOUNTER — Other Ambulatory Visit (HOSPITAL_COMMUNITY)
Admission: RE | Admit: 2023-09-22 | Discharge: 2023-09-22 | Disposition: A | Discharge status: Home / Self Care | Payer: Medicare Other | Source: Skilled Nursing Facility | Attending: Internal Medicine | Admitting: Internal Medicine

## 2023-09-22 ENCOUNTER — Non-Acute Institutional Stay (SKILLED_NURSING_FACILITY): Payer: Medicare Other | Admitting: Adult Health

## 2023-09-22 ENCOUNTER — Encounter: Payer: Self-pay | Admitting: Adult Health

## 2023-09-22 DIAGNOSIS — J9601 Acute respiratory failure with hypoxia: Secondary | ICD-10-CM | POA: Insufficient documentation

## 2023-09-22 DIAGNOSIS — R1312 Dysphagia, oropharyngeal phase: Secondary | ICD-10-CM | POA: Diagnosis not present

## 2023-09-22 LAB — CBC WITH DIFFERENTIAL/PLATELET
Abs Immature Granulocytes: 0.03 10*3/uL (ref 0.00–0.07)
Basophils Absolute: 0 10*3/uL (ref 0.0–0.1)
Basophils Relative: 0 %
Eosinophils Absolute: 0.1 10*3/uL (ref 0.0–0.5)
Eosinophils Relative: 2 %
HCT: 39.6 % (ref 36.0–46.0)
Hemoglobin: 11.9 g/dL — ABNORMAL LOW (ref 12.0–15.0)
Immature Granulocytes: 1 %
Lymphocytes Relative: 9 %
Lymphs Abs: 0.5 10*3/uL — ABNORMAL LOW (ref 0.7–4.0)
MCH: 30.3 pg (ref 26.0–34.0)
MCHC: 30.1 g/dL (ref 30.0–36.0)
MCV: 100.8 fL — ABNORMAL HIGH (ref 80.0–100.0)
Monocytes Absolute: 0.6 10*3/uL (ref 0.1–1.0)
Monocytes Relative: 9 %
Neutro Abs: 5.1 10*3/uL (ref 1.7–7.7)
Neutrophils Relative %: 79 %
Platelets: 158 10*3/uL (ref 150–400)
RBC: 3.93 MIL/uL (ref 3.87–5.11)
RDW: 14.6 % (ref 11.5–15.5)
WBC: 6.3 10*3/uL (ref 4.0–10.5)
nRBC: 0 % (ref 0.0–0.2)

## 2023-09-22 LAB — BASIC METABOLIC PANEL
Anion gap: 7 (ref 5–15)
BUN: 26 mg/dL — ABNORMAL HIGH (ref 8–23)
CO2: 33 mmol/L — ABNORMAL HIGH (ref 22–32)
Calcium: 8.4 mg/dL — ABNORMAL LOW (ref 8.9–10.3)
Chloride: 99 mmol/L (ref 98–111)
Creatinine, Ser: 0.51 mg/dL (ref 0.44–1.00)
GFR, Estimated: >60 mL/min (ref >60)
Glucose, Bld: 112 mg/dL — ABNORMAL HIGH (ref 70–99)
Potassium: 4.6 mmol/L (ref 3.5–5.1)
Sodium: 139 mmol/L (ref 135–145)

## 2023-09-22 NOTE — Progress Notes (Unsigned)
Location:  [facility] Room Number: South/151/P Place of Service:  SNF (31)   CODE STATUS: DNR  Allergies  Allergen Reactions   Gabapentin Nausea And Vomiting    hallucinations hallucinations   Other Nausea And Vomiting    Strong pain medications cause n/v Strong pain medications cause n/v   Morphine Nausea And Vomiting    Nausea & vomiting Nausea & vomiting    Chief Complaint  Patient presents with   Acute Visit    Aspiration    HPI:  The nursing staff is concerned that she has aspirated. She is coughing with shortness of breath present. She has had poor participation in therapy today. There are no reports of fevers present. She does eat ice. She is tube feeding dependent   Past Medical History:  Diagnosis Date   Allergy    seasonal & dust   Anxiety    Arthritis    DJD   Cellulitis    Family history of adverse reaction to anesthesia    n/v    GERD (gastroesophageal reflux disease)    History of vertigo    Hyperlipidemia    Hypertension    Hypothyroidism    PONV (postoperative nausea and vomiting)    Thyroid disease    hypothyroidism post Hashimoto's & partial thyroidectomy    Past Surgical History:  Procedure Laterality Date   BIOPSY  01/11/2022   Procedure: BIOPSY;  Surgeon: Lemar Lofty., MD;  Location: [facility];  Service: Gastroenterology;;   CATARACT EXTRACTION W/ INTRAOCULAR LENS  IMPLANT, BILATERAL     COLONOSCOPY     diverticulosis   ESOPHAGOGASTRODUODENOSCOPY (EGD) WITH PROPOFOL N/A 01/11/2022   Procedure: ESOPHAGOGASTRODUODENOSCOPY (EGD) WITH PROPOFOL;  Surgeon: Lemar Lofty., MD;  Location: [facility];  Service: Gastroenterology;  Laterality: N/A;   GASTROSTOMY N/A 01/17/2022   Procedure: OPEN GASTROSTOMY PLACEMENT;  Surgeon: Harriette Bouillon, MD;  Location: [facility];  Service: General;  Laterality: N/A;   IR GASTROSTOMY TUBE MOD SED  01/15/2022   IR REPLACE G-TUBE SIMPLE WO FLUORO  03/13/2023   IR REPLC  GASTRO/COLONIC TUBE PERCUT W/FLUORO  02/20/2023   JOINT REPLACEMENT  1995 & 1999   THR bilaterally    lumbar disc repair  1989   Dr Mercy Riding   SAVORY DILATION N/A 01/11/2022   Procedure: Gaspar Bidding DILATION;  Surgeon: Lemar Lofty., MD;  Location: [facility];  Service: Gastroenterology;  Laterality: N/A;   SPINE SURGERY     cervical ; Dr Venetia Maxon   THYROIDECTOMY, PARTIAL     benign nodule   TONSILLECTOMY AND ADENOIDECTOMY     TOTAL SHOULDER ARTHROPLASTY Left 02/27/2016   Procedure: LEFT REVERSE TOTAL SHOULDER ARTHROPLASTY;  Surgeon: Sheral Apley, MD;  Location: [facility];  Service: Orthopedics;  Laterality: Left;   vitreous detachment     Dr Nelle Don    Social History   Socioeconomic History   Marital status: Married    Spouse name: Not on file   Number of children: 2   Years of education: Not on file   Highest education level: Not on file  Occupational History   Not on file  Tobacco Use   Smoking status: Never   Smokeless tobacco: Never  Vaping Use   Vaping status: Never Used  Substance and Sexual Activity   Alcohol use: No   Drug use: No   Sexual activity: Not on file  Other Topics Concern   Not on file  Social History Narrative   Married for  63 years    Daughter-nurse   Daughter- paralegal       Right handed   Social Determinants of Health   Financial Resource Strain: Low Risk  (02/04/2023)   Overall Financial Resource Strain (CARDIA)    Difficulty of Paying Living Expenses: Not hard at all  Food Insecurity: No Food Insecurity (08/22/2023)   Hunger Vital Sign    Worried About Running Out of Food in the Last Year: Never true    Ran Out of Food in the Last Year: Never true  Transportation Needs: No Transportation Needs (08/22/2023)   PRAPARE - Administrator, Civil Service (Medical): No    Lack of Transportation (Non-Medical): No  Physical Activity: Sufficiently Active (02/04/2023)   Exercise Vital Sign    Days of Exercise per Week: 5 days     Minutes of Exercise per Session: 30 min  Stress: No Stress Concern Present (02/04/2023)   [employer] of Occupational Health - Occupational Stress Questionnaire    Feeling of Stress : Not at all  Social Connections: Socially Integrated (02/04/2023)   Social Connection and Isolation Panel [NHANES]    Frequency of Communication with Friends and Family: More than three times a week    Frequency of Social Gatherings with Friends and Family: More than three times a week    Attends Religious Services: More than 4 times per year    Active Member of [employer] or Organizations: Yes    Attends Banker Meetings: More than 4 times per year    Marital Status: Married  Catering manager Violence: Not At Risk (08/22/2023)   Humiliation, Afraid, Rape, and Kick questionnaire    Fear of Current or Ex-Partner: No    Emotionally Abused: No    Physically Abused: No    Sexually Abused: No   Family History  Problem Relation Age of Onset   Diabetes Mother    Hepatitis Mother        Hepatitis B,? etiology   Miscarriages / Stillbirths Mother    Stroke Mother 41   Heart disease Father        CHF   Cancer Paternal Aunt        GI cancers   Heart disease Paternal Aunt        CHF   Cancer Paternal Uncle        lung, GI   Heart disease Paternal Uncle        CHF   Colon cancer Neg Hx    Esophageal cancer Neg Hx    Pancreatic cancer Neg Hx    Rectal cancer Neg Hx    Stomach cancer Neg Hx       VITAL SIGNS BP 110/84   Pulse 61   Temp (!) 97.1 F (36.2 C)   Resp 20   Ht 5\' 5"  (1.651 m)   Wt 144 lb 6.4 oz (65.5 kg)   SpO2 96%   BMI 24.03 kg/m   Outpatient Encounter Medications as of 09/22/2023  Medication Sig Note   acetaminophen (TYLENOL) 500 MG tablet Take 500 mg by mouth every 6 (six) hours as needed for moderate pain.    albuterol (VENTOLIN HFA) 108 (90 Base) MCG/ACT inhaler Inhale 2 puffs into the lungs every 6 (six) hours as needed for wheezing or shortness of breath.  08/21/2023: Pt request spacer   calcium carbonate (TUMS - DOSED IN MG ELEMENTAL CALCIUM) 500 MG chewable tablet Chew 1 tablet by mouth daily.    FLUoxetine (PROZAC)  20 MG capsule TAKE 1 CAPSULE BY MOUTH DAILY.    fluticasone (FLONASE) 50 MCG/ACT nasal spray Place 1 spray into both nostrils as needed for allergies or rhinitis.    furosemide (LASIX) 10 MG/ML solution Take 20 mg by mouth daily.    ipratropium-albuterol (DUONEB) 0.5-2.5 (3) MG/3ML SOLN Take 3 mLs by nebulization every 6 (six) hours as needed.    levothyroxine (SYNTHROID) 112 MCG tablet TAKE ONE TABLET BY MOUTH ONCE DAILY.    loratadine (CLARITIN) 10 MG tablet Take 10 mg by mouth daily.    meclizine (ANTIVERT) 25 MG tablet Take 25 mg by mouth 3 (three) times daily as needed for dizziness.    metoCLOPramide (REGLAN) 5 MG tablet Take 1 tablet (5 mg total) by mouth in the morning and at bedtime.    Nutritional Supplements (FEEDING SUPPLEMENT, JEVITY 1.5 CAL/FIBER,) LIQD Place 65 mL/hr into feeding tube continuous. From 4 PM to 10 AM    Nystatin (GERHARDT'S BUTT CREAM) CREA Apply 1 Application topically 3 (three) times daily.    nystatin (MYCOSTATIN/NYSTOP) powder Apply 1 Application topically 3 (three) times daily.    omeprazole (PRILOSEC) 40 MG capsule Take 40 mg by mouth 2 (two) times daily.    ondansetron (ZOFRAN-ODT) 8 MG disintegrating tablet Take 8 mg by mouth every 6 (six) hours as needed for nausea or vomiting. Also give prior to therapy    Polyvinyl Alcohol-Povidone (ARTIFICIAL TEARS) 5-6 MG/ML SOLN Place 1 drop into both eyes daily.    potassium chloride (MICRO-K) 10 MEQ CR capsule Take 20 mEq by mouth 2 (two) times daily.    Water For Irrigation, Sterile (STERILE WATER FOR IRRIGATION) Irrigate with 100 mLs as directed every 4 (four) hours. Free water    [DISCONTINUED] furosemide (LASIX) 20 MG tablet Take 20 mg by mouth daily.    [DISCONTINUED] Protein (FEEDING SUPPLEMENT, PROSOURCE TF20,) liquid Place 60 mLs into feeding tube 2  (two) times daily.    No facility-administered encounter medications on file as of 09/22/2023.     SIGNIFICANT DIAGNOSTIC EXAMS  PREVIOUS  08-27-23: chest x-ray: mild chf   PREVIOUS   08-21-23: wbc 8.0; hgb 12.8; hct 40.1; mcv 98. 3plt 164; glucose 131; bun 28; creat 0.52; k+ 4.2; na++ 134; ca 8.3; gfr >60; protein 7.9; albumin 3.0; mag 2.0 08-22-23: tsh 0.742 08-24-23: glucose 156; bun 26; creat 0.54; k+ 3.6; na++ 135; ca 8.3 gfr >60  09-01-23: glucose 179; bun 31; creat 0.51; k+ 3.7; na++ 150; ca 8.5 gfr >60 BNP 58  09-04-23: glucose 199; bun 37; creat 0.58; k+ 3.5; na++ 147 ca 8.4 gfr >60 09-09-23: glucose 147; bun 29; creat 0.58; k+ 2.9; na++ 146; ca 7.8 gfr >60 09-10-23: glucose 204; bun 30; creat 0.51; k+ 3.0; na++ 145; ca 7.8; gfr >60  NO NEW LABS.   Review of Systems  Constitutional:  Negative for malaise/fatigue.  Respiratory:  Positive for cough and shortness of breath.   Cardiovascular:  Negative for chest pain, palpitations and leg swelling.  Gastrointestinal:  Negative for abdominal pain, constipation and heartburn.  Musculoskeletal:  Negative for back pain, joint pain and myalgias.  Skin: Negative.   Neurological:  Negative for dizziness.  Psychiatric/Behavioral:  The patient is not nervous/anxious.     Physical Exam Constitutional:      General: She is not in acute distress.    Appearance: She is well-developed. She is not diaphoretic.  Neck:     Thyroid: No thyromegaly.  Cardiovascular:     Rate and Rhythm:  Normal rate and regular rhythm.     Pulses: Normal pulses.     Heart sounds: Normal heart sounds.  Pulmonary:     Effort: Pulmonary effort is normal. No respiratory distress.     Breath sounds: Wheezing and rhonchi present.  Abdominal:     General: Bowel sounds are normal. There is no distension.     Palpations: Abdomen is soft.     Tenderness: There is no abdominal tenderness.     Comments: Left upper quad peg tube button       Musculoskeletal:      Cervical back: Neck supple.     Right lower leg: No edema.     Left lower leg: No edema.     Comments:  Neck contracture  Lymphadenopathy:     Cervical: No cervical adenopathy.  Skin:    General: Skin is warm and dry.  Neurological:     Mental Status: She is alert. Mental status is at baseline.     Comments: 09-10-23: BCAT 27/50  Psychiatric:        Mood and Affect: Mood normal.       ASSESSMENT/ PLAN:  TODAY  Oropharyngeal dysphagia: will get chest x-ray; cbc; bmp; will begin duoneb every 6 hours through 09-25-23.       Synthia Innocent NP [facility]  call [phone]

## 2023-09-25 ENCOUNTER — Other Ambulatory Visit (HOSPITAL_COMMUNITY): Payer: Self-pay | Admitting: Occupational Therapy

## 2023-09-25 DIAGNOSIS — R1312 Dysphagia, oropharyngeal phase: Secondary | ICD-10-CM

## 2023-09-25 DIAGNOSIS — R059 Cough, unspecified: Secondary | ICD-10-CM

## 2023-09-29 ENCOUNTER — Non-Acute Institutional Stay (SKILLED_NURSING_FACILITY): Payer: Medicare Other | Admitting: Adult Health

## 2023-09-29 ENCOUNTER — Encounter: Payer: Self-pay | Admitting: Adult Health

## 2023-09-29 DIAGNOSIS — K219 Gastro-esophageal reflux disease without esophagitis: Secondary | ICD-10-CM | POA: Diagnosis not present

## 2023-09-29 DIAGNOSIS — K222 Esophageal obstruction: Secondary | ICD-10-CM

## 2023-09-29 DIAGNOSIS — J9611 Chronic respiratory failure with hypoxia: Secondary | ICD-10-CM | POA: Diagnosis not present

## 2023-09-29 DIAGNOSIS — I959 Hypotension, unspecified: Secondary | ICD-10-CM

## 2023-09-29 DIAGNOSIS — J3089 Other allergic rhinitis: Secondary | ICD-10-CM | POA: Diagnosis not present

## 2023-09-29 DIAGNOSIS — Z23 Encounter for immunization: Secondary | ICD-10-CM | POA: Diagnosis not present

## 2023-09-29 DIAGNOSIS — R1312 Dysphagia, oropharyngeal phase: Secondary | ICD-10-CM | POA: Diagnosis not present

## 2023-09-29 NOTE — Progress Notes (Unsigned)
Location:  [facility] Room Number: 151 Place of Service:  SNF (31)   CODE STATUS: dnr   Allergies  Allergen Reactions   Gabapentin Nausea And Vomiting    hallucinations hallucinations   Other Nausea And Vomiting    Strong pain medications cause n/v Strong pain medications cause n/v   Morphine Nausea And Vomiting    Nausea & vomiting Nausea & vomiting    Chief Complaint  Patient presents with   Medical Management of Chronic Issues           Hypotension:        Non-seasonal allergic rhinitis unspecified trigger:      Dysphagia unspecified type; GERD without esophagitis; stricture and stenosis of esophagus:     HPI:  She is a 87 year old long term resident of this facility being seen for the management of her chronic illnesses: Hypotension:        Non-seasonal allergic rhinitis unspecified trigger:      Dysphagia unspecified type; GERD without esophagitis; stricture and stenosis of esophagus. Her blood pressure readings have been soft. She has chronic vertigo due to meniere disease. There are no reports of uncontrolled pain.    Past Medical History:  Diagnosis Date   Allergy    seasonal & dust   Anxiety    Arthritis    DJD   Cellulitis    Family history of adverse reaction to anesthesia    n/v    GERD (gastroesophageal reflux disease)    History of vertigo    Hyperlipidemia    Hypertension    Hypothyroidism    PONV (postoperative nausea and vomiting)    Thyroid disease    hypothyroidism post Hashimoto's & partial thyroidectomy    Past Surgical History:  Procedure Laterality Date   BIOPSY  01/11/2022   Procedure: BIOPSY;  Surgeon: Meridee Score Netty Starring., MD;  Location: [facility];  Service: Gastroenterology;;   CATARACT EXTRACTION W/ INTRAOCULAR LENS  IMPLANT, BILATERAL     COLONOSCOPY     diverticulosis   ESOPHAGOGASTRODUODENOSCOPY (EGD) WITH PROPOFOL N/A 01/11/2022   Procedure: ESOPHAGOGASTRODUODENOSCOPY (EGD) WITH PROPOFOL;  Surgeon:  Lemar Lofty., MD;  Location: [facility];  Service: Gastroenterology;  Laterality: N/A;   GASTROSTOMY N/A 01/17/2022   Procedure: OPEN GASTROSTOMY PLACEMENT;  Surgeon: Harriette Bouillon, MD;  Location: [facility];  Service: General;  Laterality: N/A;   IR GASTROSTOMY TUBE MOD SED  01/15/2022   IR REPLACE G-TUBE SIMPLE WO FLUORO  03/13/2023   IR REPLC GASTRO/COLONIC TUBE PERCUT W/FLUORO  02/20/2023   JOINT REPLACEMENT  1995 & 1999   THR bilaterally    lumbar disc repair  1989   Dr Mercy Riding   SAVORY DILATION N/A 01/11/2022   Procedure: Gaspar Bidding DILATION;  Surgeon: Lemar Lofty., MD;  Location: [facility];  Service: Gastroenterology;  Laterality: N/A;   SPINE SURGERY     cervical ; Dr Venetia Maxon   THYROIDECTOMY, PARTIAL     benign nodule   TONSILLECTOMY AND ADENOIDECTOMY     TOTAL SHOULDER ARTHROPLASTY Left 02/27/2016   Procedure: LEFT REVERSE TOTAL SHOULDER ARTHROPLASTY;  Surgeon: Sheral Apley, MD;  Location: [facility];  Service: Orthopedics;  Laterality: Left;   vitreous detachment     Dr Nelle Don    Social History   Socioeconomic History   Marital status: Married    Spouse name: Not on file   Number of children: 2   Years of education: Not on file   Highest education  level: Not on file  Occupational History   Not on file  Tobacco Use   Smoking status: Never   Smokeless tobacco: Never  Vaping Use   Vaping status: Never Used  Substance and Sexual Activity   Alcohol use: No   Drug use: No   Sexual activity: Not on file  Other Topics Concern   Not on file  Social History Narrative   Married for 63 years    Daughter-nurse   Daughter- paralegal       Right handed   Social Determinants of Health   Financial Resource Strain: Low Risk  (02/04/2023)   Overall Financial Resource Strain (CARDIA)    Difficulty of Paying Living Expenses: Not hard at all  Food Insecurity: No Food Insecurity (08/22/2023)   Hunger Vital Sign    Worried About Running Out of Food in the  Last Year: Never true    Ran Out of Food in the Last Year: Never true  Transportation Needs: No Transportation Needs (08/22/2023)   PRAPARE - Administrator, Civil Service (Medical): No    Lack of Transportation (Non-Medical): No  Physical Activity: Sufficiently Active (02/04/2023)   Exercise Vital Sign    Days of Exercise per Week: 5 days    Minutes of Exercise per Session: 30 min  Stress: No Stress Concern Present (02/04/2023)   [employer] of Occupational Health - Occupational Stress Questionnaire    Feeling of Stress : Not at all  Social Connections: Socially Integrated (02/04/2023)   Social Connection and Isolation Panel [NHANES]    Frequency of Communication with Friends and Family: More than three times a week    Frequency of Social Gatherings with Friends and Family: More than three times a week    Attends Religious Services: More than 4 times per year    Active Member of [employer] or Organizations: Yes    Attends Engineer, structural: More than 4 times per year    Marital Status: Married  Catering manager Violence: Not At Risk (08/22/2023)   Humiliation, Afraid, Rape, and Kick questionnaire    Fear of Current or Ex-Partner: No    Emotionally Abused: No    Physically Abused: No    Sexually Abused: No   Family History  Problem Relation Age of Onset   Diabetes Mother    Hepatitis Mother        Hepatitis B,? etiology   Miscarriages / Stillbirths Mother    Stroke Mother 20   Heart disease Father        CHF   Cancer Paternal Aunt        GI cancers   Heart disease Paternal Aunt        CHF   Cancer Paternal Uncle        lung, GI   Heart disease Paternal Uncle        CHF   Colon cancer Neg Hx    Esophageal cancer Neg Hx    Pancreatic cancer Neg Hx    Rectal cancer Neg Hx    Stomach cancer Neg Hx       VITAL SIGNS BP (!) 115/56   Pulse 94   Temp (!) 97.4 F (36.3 C)   Resp 14   Ht 5\' 5"  (1.651 m)   Wt 144 lb 12.8 oz (65.7 kg)   SpO2 95%    BMI 24.10 kg/m   Outpatient Encounter Medications as of 09/29/2023  Medication Sig Note   acetaminophen (TYLENOL) 500 MG  tablet Take 500 mg by mouth every 6 (six) hours as needed for moderate pain.    albuterol (VENTOLIN HFA) 108 (90 Base) MCG/ACT inhaler Inhale 2 puffs into the lungs every 6 (six) hours as needed for wheezing or shortness of breath. 08/21/2023: Pt request spacer   calcium carbonate (TUMS - DOSED IN MG ELEMENTAL CALCIUM) 500 MG chewable tablet Chew 1 tablet by mouth daily.    FLUoxetine (PROZAC) 20 MG capsule TAKE 1 CAPSULE BY MOUTH DAILY.    fluticasone (FLONASE) 50 MCG/ACT nasal spray Place 1 spray into both nostrils as needed for allergies or rhinitis.    furosemide (LASIX) 10 MG/ML solution Take 20 mg by mouth daily.    ipratropium-albuterol (DUONEB) 0.5-2.5 (3) MG/3ML SOLN Take 3 mLs by nebulization every 6 (six) hours as needed.    levothyroxine (SYNTHROID) 112 MCG tablet TAKE ONE TABLET BY MOUTH ONCE DAILY.    loratadine (CLARITIN) 10 MG tablet Take 10 mg by mouth daily.    meclizine (ANTIVERT) 25 MG tablet Take 25 mg by mouth 3 (three) times daily as needed for dizziness.    metoCLOPramide (REGLAN) 5 MG tablet Take 1 tablet (5 mg total) by mouth in the morning and at bedtime.    Nutritional Supplements (FEEDING SUPPLEMENT, JEVITY 1.5 CAL/FIBER,) LIQD Place 65 mL/hr into feeding tube continuous. From 4 PM to 10 AM    Nystatin (GERHARDT'S BUTT CREAM) CREA Apply 1 Application topically 3 (three) times daily.    nystatin (MYCOSTATIN/NYSTOP) powder Apply 1 Application topically 3 (three) times daily.    omeprazole (PRILOSEC) 40 MG capsule Take 40 mg by mouth 2 (two) times daily.    ondansetron (ZOFRAN-ODT) 8 MG disintegrating tablet Take 8 mg by mouth every 6 (six) hours as needed for nausea or vomiting. Also give prior to therapy    Polyvinyl Alcohol-Povidone (ARTIFICIAL TEARS) 5-6 MG/ML SOLN Place 1 drop into both eyes daily.    potassium chloride (MICRO-K) 10 MEQ CR  capsule Take 20 mEq by mouth 2 (two) times daily.    Water For Irrigation, Sterile (STERILE WATER FOR IRRIGATION) Irrigate with 100 mLs as directed every 4 (four) hours. Free water    No facility-administered encounter medications on file as of 09/29/2023.     SIGNIFICANT DIAGNOSTIC EXAMS  PREVIOUS  08-27-23: chest x-ray: mild chf   PREVIOUS   08-21-23: wbc 8.0; hgb 12.8; hct 40.1; mcv 98. 3plt 164; glucose 131; bun 28; creat 0.52; k+ 4.2; na++ 134; ca 8.3; gfr >60; protein 7.9; albumin 3.0; mag 2.0 08-22-23: tsh 0.742 08-24-23: glucose 156; bun 26; creat 0.54; k+ 3.6; na++ 135; ca 8.3 gfr >60  09-01-23: glucose 179; bun 31; creat 0.51; k+ 3.7; na++ 150; ca 8.5 gfr >60 BNP 58  09-04-23: glucose 199; bun 37; creat 0.58; k+ 3.5; na++ 147 ca 8.4 gfr >60 09-09-23: glucose 147; bun 29; creat 0.58; k+ 2.9; na++ 146; ca 7.8 gfr >60  TODAY  09-10-23: glucose 204; bun 30; creat 0.51; k+ 3.0; na++ 145; ca 7.8; gfr >60 09-15-23: glucose 162; bun 27; creat 0.48; k+ 4.3; na++ 133; ca 8.0; gfr >60 BNP 45 09-22-23: wbc 6.3; hgb 11.9; hct 39.6; mcv 100.8 plt 158; glucose 112; bun 26; creat 0.51; k+ 4.6; na++ 139; ca 8.4; gfr >60    Review of Systems  Constitutional:  Negative for malaise/fatigue.  Respiratory:  Negative for cough and shortness of breath.   Cardiovascular:  Negative for chest pain, palpitations and leg swelling.  Gastrointestinal:  Negative for abdominal  pain, constipation and heartburn.  Musculoskeletal:  Negative for back pain, joint pain and myalgias.  Skin: Negative.   Neurological:  Negative for dizziness.  Psychiatric/Behavioral:  The patient is not nervous/anxious.    Physical Exam Constitutional:      General: She is not in acute distress.    Appearance: She is well-developed. She is not diaphoretic.  Neck:     Thyroid: No thyromegaly.  Cardiovascular:     Rate and Rhythm: Normal rate and regular rhythm.     Pulses: Normal pulses.     Heart sounds: Normal heart sounds.   Pulmonary:     Effort: Pulmonary effort is normal. No respiratory distress.     Breath sounds: Normal breath sounds.  Abdominal:     General: Bowel sounds are normal. There is no distension.     Palpations: Abdomen is soft.     Tenderness: There is no abdominal tenderness.     Comments: Left upper quad peg tube button    Musculoskeletal:     Cervical back: Neck supple.     Right lower leg: No edema.     Left lower leg: No edema.     Comments:  Neck contracture  Lymphadenopathy:     Cervical: No cervical adenopathy.  Skin:    General: Skin is warm and dry.  Neurological:     Mental Status: She is alert. Mental status is at baseline.     Comments: 09-16-23: BCAT 27/50  Psychiatric:        Mood and Affect: Mood normal.    ASSESSMENT/ PLAN:  TODAY  Hypotension: has soft readings: will being midodrine 2.5 mg three times daily and will check blood pressure daily   2. Non-seasonal allergic rhinitis unspecified trigger: will continue claritin 10 mg daily  3. Dysphagia unspecified type; GERD without esophagitis; stricture and stenosis of esophagus: is on long term tube feedings; will continue prilosec 40 mg twice daily and reglan 5 mg twice daily   PREVIOUS   4. Hashimotos thyroiditis: is status post partial thyroidectomy; tsh 0.742; will continue synthroid 112 mcg daily   5. Depression with anxiety: will continue prozac 20 mg daily   6. Moderate protein calorie malnutrition: albumin 3.0 will continue prosource 60 mL twice daily   7. Bilateral lower extremity edema: will continue lasix 20 mg every  day  8. Aortic atherosclerosis: ( ct 08-21-23)   9. Meniere's disease has meclizine 25 mg three times daily as needed.     Synthia Innocent NP [facility]  call [phone]

## 2023-10-02 DIAGNOSIS — I959 Hypotension, unspecified: Secondary | ICD-10-CM | POA: Insufficient documentation

## 2023-10-06 ENCOUNTER — Ambulatory Visit (HOSPITAL_COMMUNITY)
Admission: RE | Admit: 2023-10-06 | Discharge: 2023-10-06 | Disposition: A | Discharge status: Home / Self Care | Payer: Medicare Other | Source: Ambulatory Visit | Attending: Interventional Radiology | Admitting: Interventional Radiology

## 2023-10-06 DIAGNOSIS — Z431 Encounter for attention to gastrostomy: Secondary | ICD-10-CM | POA: Diagnosis not present

## 2023-10-06 DIAGNOSIS — R633 Feeding difficulties, unspecified: Secondary | ICD-10-CM | POA: Diagnosis not present

## 2023-10-06 DIAGNOSIS — Z23 Encounter for immunization: Secondary | ICD-10-CM | POA: Diagnosis not present

## 2023-10-06 DIAGNOSIS — J9611 Chronic respiratory failure with hypoxia: Secondary | ICD-10-CM | POA: Diagnosis not present

## 2023-10-06 HISTORY — PX: IR REPLACE G-TUBE SIMPLE WO FLUORO: IMG2323

## 2023-10-06 MED ORDER — LIDOCAINE VISCOUS HCL 2 % MT SOLN
OROMUCOSAL | Status: AC
  Filled 2023-10-06: qty 15

## 2023-10-06 MED ORDER — LIDOCAINE VISCOUS HCL 2 % MT SOLN
15.0000 mL | Freq: Once | OROMUCOSAL | Status: AC
  Administered 2023-10-06: 5 mL via TOPICAL

## 2023-10-09 ENCOUNTER — Other Ambulatory Visit: Payer: Self-pay | Admitting: *Deleted

## 2023-10-09 ENCOUNTER — Emergency Department (HOSPITAL_COMMUNITY): Payer: Medicare Other

## 2023-10-09 ENCOUNTER — Encounter (HOSPITAL_COMMUNITY): Payer: Self-pay

## 2023-10-09 ENCOUNTER — Other Ambulatory Visit: Payer: Self-pay

## 2023-10-09 ENCOUNTER — Observation Stay (HOSPITAL_COMMUNITY)
Admission: EM | Admit: 2023-10-09 | Discharge: 2023-10-11 | Disposition: A | Discharge status: Home / Self Care | Payer: Medicare Other | Attending: Internal Medicine | Admitting: Internal Medicine

## 2023-10-09 DIAGNOSIS — W19XXXA Unspecified fall, initial encounter: Secondary | ICD-10-CM | POA: Diagnosis not present

## 2023-10-09 DIAGNOSIS — Z471 Aftercare following joint replacement surgery: Secondary | ICD-10-CM | POA: Diagnosis not present

## 2023-10-09 DIAGNOSIS — T84091A Other mechanical complication of internal left hip prosthesis, initial encounter: Secondary | ICD-10-CM | POA: Diagnosis not present

## 2023-10-09 DIAGNOSIS — M25559 Pain in unspecified hip: Secondary | ICD-10-CM | POA: Diagnosis not present

## 2023-10-09 DIAGNOSIS — I10 Essential (primary) hypertension: Secondary | ICD-10-CM | POA: Insufficient documentation

## 2023-10-09 DIAGNOSIS — S73005A Unspecified dislocation of left hip, initial encounter: Principal | ICD-10-CM | POA: Diagnosis present

## 2023-10-09 DIAGNOSIS — I499 Cardiac arrhythmia, unspecified: Secondary | ICD-10-CM | POA: Diagnosis not present

## 2023-10-09 DIAGNOSIS — R918 Other nonspecific abnormal finding of lung field: Secondary | ICD-10-CM | POA: Diagnosis not present

## 2023-10-09 DIAGNOSIS — Y828 Other medical devices associated with adverse incidents: Secondary | ICD-10-CM | POA: Diagnosis not present

## 2023-10-09 DIAGNOSIS — J90 Pleural effusion, not elsewhere classified: Secondary | ICD-10-CM | POA: Diagnosis not present

## 2023-10-09 DIAGNOSIS — Z96643 Presence of artificial hip joint, bilateral: Secondary | ICD-10-CM | POA: Diagnosis not present

## 2023-10-09 DIAGNOSIS — T84021A Dislocation of internal left hip prosthesis, initial encounter: Secondary | ICD-10-CM | POA: Diagnosis not present

## 2023-10-09 DIAGNOSIS — Z96612 Presence of left artificial shoulder joint: Secondary | ICD-10-CM | POA: Diagnosis not present

## 2023-10-09 DIAGNOSIS — Z743 Need for continuous supervision: Secondary | ICD-10-CM | POA: Diagnosis not present

## 2023-10-09 DIAGNOSIS — E039 Hypothyroidism, unspecified: Secondary | ICD-10-CM | POA: Diagnosis not present

## 2023-10-09 DIAGNOSIS — J984 Other disorders of lung: Secondary | ICD-10-CM | POA: Diagnosis not present

## 2023-10-09 DIAGNOSIS — Z79899 Other long term (current) drug therapy: Secondary | ICD-10-CM | POA: Diagnosis not present

## 2023-10-09 DIAGNOSIS — T782XXA Anaphylactic shock, unspecified, initial encounter: Secondary | ICD-10-CM | POA: Diagnosis not present

## 2023-10-09 LAB — CBC WITH DIFFERENTIAL/PLATELET
Abs Immature Granulocytes: 0.06 10*3/uL (ref 0.00–0.07)
Basophils Absolute: 0 10*3/uL (ref 0.0–0.1)
Basophils Relative: 0 %
Eosinophils Absolute: 0.1 10*3/uL (ref 0.0–0.5)
Eosinophils Relative: 1 %
HCT: 38.2 % (ref 36.0–46.0)
Hemoglobin: 11.5 g/dL — ABNORMAL LOW (ref 12.0–15.0)
Immature Granulocytes: 1 %
Lymphocytes Relative: 10 %
Lymphs Abs: 0.6 10*3/uL — ABNORMAL LOW (ref 0.7–4.0)
MCH: 30.8 pg (ref 26.0–34.0)
MCHC: 30.1 g/dL (ref 30.0–36.0)
MCV: 102.4 fL — ABNORMAL HIGH (ref 80.0–100.0)
Monocytes Absolute: 0.5 10*3/uL (ref 0.1–1.0)
Monocytes Relative: 8 %
Neutro Abs: 4.8 10*3/uL (ref 1.7–7.7)
Neutrophils Relative %: 80 %
Platelets: 209 10*3/uL (ref 150–400)
RBC: 3.73 MIL/uL — ABNORMAL LOW (ref 3.87–5.11)
RDW: 15.5 % (ref 11.5–15.5)
WBC: 6.1 10*3/uL (ref 4.0–10.5)
nRBC: 0 % (ref 0.0–0.2)

## 2023-10-09 LAB — BASIC METABOLIC PANEL
Anion gap: 10 (ref 5–15)
BUN: 31 mg/dL — ABNORMAL HIGH (ref 8–23)
CO2: 35 mmol/L — ABNORMAL HIGH (ref 22–32)
Calcium: 8.4 mg/dL — ABNORMAL LOW (ref 8.9–10.3)
Chloride: 100 mmol/L (ref 98–111)
Creatinine, Ser: 0.53 mg/dL (ref 0.44–1.00)
GFR, Estimated: >60 mL/min (ref >60)
Glucose, Bld: 174 mg/dL — ABNORMAL HIGH (ref 70–99)
Potassium: 4.3 mmol/L (ref 3.5–5.1)
Sodium: 145 mmol/L (ref 135–145)

## 2023-10-09 MED ORDER — KETOROLAC TROMETHAMINE 15 MG/ML IJ SOLN
15.0000 mg | Freq: Once | INTRAMUSCULAR | Status: AC
  Administered 2023-10-09: 15 mg via INTRAVENOUS
  Filled 2023-10-09: qty 1

## 2023-10-09 MED ORDER — MELATONIN 3 MG PO TABS: 3.0000 mg | ORAL_TABLET | Freq: Every evening | ORAL | Status: DC | PRN

## 2023-10-09 MED ORDER — CALCIUM CARBONATE ANTACID 500 MG PO CHEW
1.0000 | CHEWABLE_TABLET | Freq: Every day | ORAL | Status: DC
Start: 2023-10-10 — End: 2023-10-11
  Administered 2023-10-10 – 2023-10-11 (×2): 200 mg
  Filled 2023-10-09 (×2): qty 1

## 2023-10-09 MED ORDER — ONDANSETRON HCL 4 MG/2ML IJ SOLN
4.0000 mg | Freq: Once | INTRAMUSCULAR | Status: AC
  Administered 2023-10-09: 4 mg via INTRAVENOUS
  Filled 2023-10-09: qty 2

## 2023-10-09 MED ORDER — ONDANSETRON HCL 4 MG/2ML IJ SOLN
4.0000 mg | Freq: Four times a day (QID) | INTRAMUSCULAR | Status: DC | PRN
  Administered 2023-10-11: 4 mg via INTRAVENOUS

## 2023-10-09 MED ORDER — PHENYLEPHRINE 80 MCG/ML (10ML) SYRINGE FOR IV PUSH (FOR BLOOD PRESSURE SUPPORT)
80.0000 ug | PREFILLED_SYRINGE | Freq: Once | INTRAVENOUS | Status: AC
  Administered 2023-10-09: 160 ug via INTRAVENOUS
  Filled 2023-10-09: qty 10

## 2023-10-09 MED ORDER — LACTATED RINGERS IV BOLUS
1000.0000 mL | Freq: Once | INTRAVENOUS | Status: AC
  Administered 2023-10-09: 1000 mL via INTRAVENOUS

## 2023-10-09 MED ORDER — FLUOXETINE HCL 20 MG PO CAPS
20.0000 mg | ORAL_CAPSULE | Freq: Every day | ORAL | Status: DC
Start: 2023-10-10 — End: 2023-10-11
  Administered 2023-10-10 – 2023-10-11 (×2): 20 mg
  Filled 2023-10-09 (×2): qty 1

## 2023-10-09 MED ORDER — ONDANSETRON 4 MG PO TBDP
4.0000 mg | ORAL_TABLET | Freq: Once | ORAL | Status: DC
  Filled 2023-10-09: qty 1

## 2023-10-09 MED ORDER — OMEPRAZOLE 20 MG PO TBDD
40.0000 mg | DELAYED_RELEASE_TABLET | Freq: Every day | ORAL | Status: DC
  Administered 2023-10-10 – 2023-10-11 (×2): 40 mg via ORAL
  Filled 2023-10-09 (×3): qty 2

## 2023-10-09 MED ORDER — SORBITOL 70 % SOLN: 30.0000 mL | Freq: Every day | Status: DC | PRN

## 2023-10-09 MED ORDER — LEVOTHYROXINE SODIUM 112 MCG PO TABS
112.0000 ug | ORAL_TABLET | Freq: Every day | ORAL | Status: DC
Start: 2023-10-10 — End: 2023-10-11
  Administered 2023-10-10 – 2023-10-11 (×2): 112 ug
  Filled 2023-10-09 (×2): qty 1

## 2023-10-09 MED ORDER — ONDANSETRON HCL 4 MG PO TABS: 4.0000 mg | ORAL_TABLET | Freq: Four times a day (QID) | ORAL | Status: DC | PRN

## 2023-10-09 MED ORDER — MECLIZINE HCL 12.5 MG PO TABS
25.0000 mg | ORAL_TABLET | Freq: Once | ORAL | Status: AC
  Administered 2023-10-09: 25 mg via ORAL
  Filled 2023-10-09: qty 2

## 2023-10-09 MED ORDER — PROPOFOL 10 MG/ML IV BOLUS
1.0000 mg/kg | Freq: Once | INTRAVENOUS | Status: AC
  Administered 2023-10-09: 60.3 mg via INTRAVENOUS
  Filled 2023-10-09: qty 20

## 2023-10-09 MED ORDER — ACETAMINOPHEN 500 MG PO TABS
500.0000 mg | ORAL_TABLET | Freq: Four times a day (QID) | ORAL | Status: DC | PRN
  Administered 2023-10-09: 500 mg via ORAL
  Filled 2023-10-09: qty 1

## 2023-10-09 MED ORDER — MIDODRINE HCL 5 MG PO TABS
2.5000 mg | ORAL_TABLET | Freq: Three times a day (TID) | ORAL | Status: DC
Start: 2023-10-10 — End: 2023-10-11
  Administered 2023-10-10 – 2023-10-11 (×5): 2.5 mg via JEJUNOSTOMY
  Filled 2023-10-09 (×5): qty 1

## 2023-10-09 MED ORDER — ACETAMINOPHEN 650 MG RE SUPP
650.0000 mg | Freq: Four times a day (QID) | RECTAL | Status: DC | PRN
Start: 2023-10-09 — End: 2023-10-11

## 2023-10-09 NOTE — ED Provider Notes (Signed)
.Sedation  Date/Time: 10/09/2023 8:11 PM  Performed by: Glendora Score, MD Authorized by: Glendora Score, MD   Consent:    Consent obtained:  Verbal   Consent given by:  Patient   Risks discussed:  Allergic reaction, dysrhythmia, inadequate sedation, nausea, prolonged hypoxia resulting in organ damage, prolonged sedation necessitating reversal, respiratory compromise necessitating ventilatory assistance and intubation and vomiting   Alternatives discussed:  Analgesia without sedation, anxiolysis and regional anesthesia Universal protocol:    Procedure explained and questions answered to patient or proxy's satisfaction: yes     Relevant documents present and verified: yes     Test results available: yes     Imaging studies available: yes     Required blood products, implants, devices, and special equipment available: yes     Site/side marked: yes     Immediately prior to procedure, a time out was called: yes     Patient identity confirmed:  Verbally with patient Indications:    Procedure necessitating sedation performed by:  Physician performing sedation Pre-sedation assessment:    Time since last food or drink:  0800   ASA classification: class 1 - normal, healthy patient     Mouth opening:  3 or more finger widths   Thyromental distance:  4 finger widths   Mallampati score:  I - soft palate, uvula, fauces, pillars visible   Neck mobility: normal     Pre-sedation assessments completed and reviewed: airway patency, cardiovascular function, hydration status, mental status, nausea/vomiting, pain level, respiratory function and temperature   Immediate pre-procedure details:    Reassessment: Patient reassessed immediately prior to procedure     Reviewed: vital signs, relevant labs/tests and NPO status     Verified: bag valve mask available, emergency equipment available, intubation equipment available, IV patency confirmed, oxygen available and suction available   Procedure details  (see MAR for exact dosages):    Preoxygenation:  Nasal cannula   Sedation:  Propofol   Intended level of sedation: deep   Intra-procedure monitoring:  Blood pressure monitoring, cardiac monitor, continuous pulse oximetry, frequent LOC assessments, frequent vital sign checks and continuous capnometry   Intra-procedure events: none     Total Provider sedation time (minutes):  15 Post-procedure details:    Attendance: Constant attendance by certified staff until patient recovered     Recovery: Patient returned to pre-procedure baseline     Post-sedation assessments completed and reviewed: airway patency, cardiovascular function, hydration status, mental status, nausea/vomiting, pain level, respiratory function and temperature     Patient is stable for discharge or admission: yes     Procedure completion:  Tolerated well, no immediate complications .Ortho Injury Treatment  Date/Time: 10/09/2023 8:12 PM  Performed by: Glendora Score, MD Authorized by: Glendora Score, MD   Consent:    Consent obtained:  Verbal   Consent given by:  Patient and healthcare agent   Risks discussed:  Irreducible dislocation, fracture, nerve damage, restricted joint movement, vascular damage, stiffness and recurrent dislocation   Alternatives discussed:  No treatment, alternative treatment and immobilizationInjury location: hip Location details: left hip Injury type: dislocation Dislocation type: posterior Spontaneous dislocation: yes Prosthesis: yes Pre-procedure distal perfusion: normal Pre-procedure neurological function: normal Pre-procedure range of motion: reduced  Anesthesia: Local anesthesia used: no  Patient sedated: Yes. Refer to sedation procedure documentation for details of sedation. Manipulation performed: yes Reduction method: captain morgan. Post-procedure distal perfusion: normal Post-procedure neurological function: normal Post-procedure range of motion: unchanged Comments: Reduction  initially successful, but hip redislocated after reduction  Glendora Score, MD 10/09/23 2015

## 2023-10-09 NOTE — ED Notes (Signed)
Report given to [facility] 

## 2023-10-09 NOTE — ED Provider Notes (Signed)
Pt is an 87 yo with a left hip that is dislocated.  They were unable to get it reduced at [facility], so was sent here for further eval.  Pt d/w Dr. Carola Frost (ortho) who recommends hospitalist admit and NPO after midnight.   Pt d/w Dr. Gasper Sells (triad) who will admit.   Jacalyn Lefevre, MD 10/09/23 2255

## 2023-10-09 NOTE — Progress Notes (Addendum)
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 Hip reduced in the ED at [facility] tonight per report but then popped out with minimal turning while awaiting x-ray. Sedation was complicated by desats afterward.   Spoke briefly with daughter who would like surgical revision to improve stability. I explained this is unlikely in the current context but would discuss with total joint surgeon colleagues. She has previously seen Dr. Blanchie Dessert (after last dislocation in Nov 22) and would be pleased if he could re-engage. I have reached out to him.  Tomorrow the plan would be for closed reduction in the OR and development of plan to address stability. Ideally this would be performed by surgeon advising her. Patient's incontinence has exacerbated concerns about the hip and abduction orthosis bracing.   Full consultation to follow in the am.  Myrene Galas, MD [facility], [place] [phone]

## 2023-10-09 NOTE — ED Notes (Signed)
Pt transported via [facility] to [facility]

## 2023-10-09 NOTE — ED Notes (Signed)
Report received from [place].

## 2023-10-09 NOTE — Progress Notes (Signed)
Conscious sedation for a hip reduction. There was suction, ambu bag, and 4 L [place] CO2 monitoring.There were issues picking up the Pt's SAT and RT bagged Pt and he SATS increased. Pt is on 4L COR [place] and her CO@ 18, HR 100, SAT 99%BP 156/89. The Pt is stable

## 2023-10-09 NOTE — ED Triage Notes (Signed)
Pt slide out of wheelchair at [facility] and injured left leg. Pt has dislocated hip before and had hip replacement.

## 2023-10-09 NOTE — Patient Outreach (Signed)
Post- Acute Care Coordinator follow up. Tasha Moss resides in [facility] skilled nursing facility.  Screening for potential chronic care management services as a benefit of health plan and primary care provider.  Update received from [place], SNF social worker. Tasha Moss will remain at [facility] in LTC.   No identifiable CCM needs.   Raiford Noble, MSN, RN, BSN [place]  [facility] RN Post- Acute Care Coordinator Direct Dial: [phone]

## 2023-10-09 NOTE — ED Provider Notes (Addendum)
EMERGENCY DEPARTMENT AT [facility] Provider Note   CSN: [id number] Arrival date & time: 10/09/23  1438     History  No chief complaint on file.   Tasha Moss is a 87 y.o. female past medical history significant for bilateral hip replacement presents today after sliding out of her wheelchair at [facility] in the morning of the left leg pain.  Patient has previously dislocated this hip before and had hip replacement.  Patient denies shortness of breath, chest pain, loss of consciousness, hitting her head, abdominal pain.  Patient endorses left leg pain.  HPI     Home Medications Prior to Admission medications   Medication Sig Start Date End Date Taking? Authorizing Provider  acetaminophen (TYLENOL) 500 MG tablet Place 500 mg into feeding tube every 6 (six) hours as needed for moderate pain (pain score 4-6).   Yes [provider]  albuterol (VENTOLIN HFA) 108 (90 Base) MCG/ACT inhaler Inhale 2 puffs into the lungs every 6 (six) hours as needed for wheezing or shortness of breath. 07/04/23  Yes Sheliah Hatch, MD  calcium carbonate (TUMS - DOSED IN MG ELEMENTAL CALCIUM) 500 MG chewable tablet Chew 1 tablet by mouth daily.   Yes [provider]  FLUoxetine (PROZAC) 20 MG capsule TAKE 1 CAPSULE BY MOUTH DAILY. 09/02/23  Yes Sheliah Hatch, MD  fluticasone (FLONASE) 50 MCG/ACT nasal spray Place 1 spray into both nostrils as needed for allergies or rhinitis.   Yes [provider]  furosemide (LASIX) 10 MG/ML solution Take 20 mg by mouth daily.   Yes [provider]  levothyroxine (SYNTHROID) 112 MCG tablet TAKE ONE TABLET BY MOUTH ONCE DAILY. 08/01/23  Yes Sheliah Hatch, MD  loratadine (CLARITIN) 10 MG tablet Take 10 mg by mouth daily.   Yes [provider]  meclizine (ANTIVERT) 25 MG tablet Take 25 mg by mouth 3 (three) times daily as needed for dizziness.   Yes [provider]  metoCLOPramide  (REGLAN) 5 MG tablet Take 1 tablet (5 mg total) by mouth in the morning and at bedtime. 11/05/22  Yes Cirigliano, Vito V, DO  midodrine (PROAMATINE) 2.5 MG tablet Take 2.5 mg by mouth 3 (three) times daily with meals.   Yes [provider]  Nutritional Supplements (FEEDING SUPPLEMENT, JEVITY 1.5 CAL/FIBER,) LIQD Place 65 mL/hr into feeding tube continuous. From 4 PM to 10 AM   Yes [provider]  nystatin (MYCOSTATIN/NYSTOP) powder Apply 1 Application topically 3 (three) times daily. 07/04/23  Yes Sheliah Hatch, MD  omeprazole (PRILOSEC) 40 MG capsule Take 40 mg by mouth 2 (two) times daily.   Yes [provider]  ondansetron (ZOFRAN-ODT) 8 MG disintegrating tablet Take 8 mg by mouth every 6 (six) hours as needed for nausea or vomiting. Also give prior to therapy   Yes [provider]  Polyvinyl Alcohol-Povidone (ARTIFICIAL TEARS) 5-6 MG/ML SOLN Place 1 drop into both eyes daily.   Yes [provider]  potassium chloride 20 MEQ/15ML (10%) SOLN Take 20 mEq by mouth 2 (two) times daily. 09/29/23  Yes [provider]  Protein (FEEDING SUPPLEMENT, PROSOURCE TF20,) liquid Place 60 mLs into feeding tube 2 (two) times daily.   Yes [provider]  ipratropium-albuterol (DUONEB) 0.5-2.5 (3) MG/3ML SOLN Take 3 mLs by nebulization every 6 (six) hours.  09/25/23  [provider]  Water For Irrigation, Sterile (STERILE WATER FOR IRRIGATION) Irrigate with 100 mLs as directed every 4 (four) hours.  Free water    [provider]      Allergies    Gabapentin, Other, and Morphine    Review of Systems   Review of Systems  Musculoskeletal:  Positive for arthralgias.    Physical Exam Updated Vital Signs BP 119/62   Pulse 78   Temp 98.1 F (36.7 C) (Oral)   Resp 19   Ht 5\' 5"  (1.651 m)   Wt 60.3 kg   SpO2 95%   BMI 22.13 kg/m  Physical Exam Vitals and nursing note reviewed.  Constitutional:      General: She is not  in acute distress.    Appearance: She is well-developed.  HENT:     Head: Normocephalic and atraumatic.  Eyes:     Conjunctiva/sclera: Conjunctivae normal.  Cardiovascular:     Rate and Rhythm: Normal rate and regular rhythm.     Pulses: Normal pulses.     Heart sounds: No murmur heard. Pulmonary:     Effort: Pulmonary effort is normal. No respiratory distress.     Breath sounds: Normal breath sounds.  Abdominal:     Palpations: Abdomen is soft.     Tenderness: There is no abdominal tenderness.  Musculoskeletal:     Cervical back: Neck supple.     Comments: Patient's left leg shortened and internally rotated.  Neurovascularly intact.  Skin:    General: Skin is warm and dry.     Capillary Refill: Capillary refill takes less than 2 seconds.  Neurological:     Mental Status: She is alert.  Psychiatric:        Mood and Affect: Mood normal.     ED Results / Procedures / Treatments   Labs (all labs ordered are listed, but only abnormal results are displayed) Labs Reviewed  CBC WITH DIFFERENTIAL/PLATELET - Abnormal; Notable for the following components:      Result Value   RBC 3.73 (*)    Hemoglobin 11.5 (*)    MCV 102.4 (*)    Lymphs Abs 0.6 (*)    All other components within normal limits  BASIC METABOLIC PANEL - Abnormal; Notable for the following components:   CO2 35 (*)    Glucose, Bld 174 (*)    BUN 31 (*)    Calcium 8.4 (*)    All other components within normal limits    EKG EKG Interpretation Date/Time:  Thursday October 09 2023 15:26:03 EDT Ventricular Rate:  95 PR Interval:  154 QRS Duration:  81 QT Interval:  377 QTC Calculation: 474 R Axis:   -22  Text Interpretation: Sinus rhythm Inferior infarct, old No significant change since last tracing Confirmed by Jacalyn Lefevre [phone]) on 10/09/2023 9:53:17 PM  Radiology DG Chest Port 1 View  Result Date: 10/09/2023 CLINICAL DATA:  Hypoxia. EXAM: PORTABLE CHEST 1 VIEW COMPARISON:  August 21, 2023  FINDINGS: The heart size and mediastinal contours are within normal limits. Small surgical clips are seen along the superior mediastinum on the right. There is stable moderate severity elevation of the right hemidiaphragm with subsequent right-sided volume loss. A very small left pleural effusion is noted. Mild, chronic appearing increased interstitial lung markings are seen without evidence of focal consolidation or pneumothorax. A radiopaque fusion plate and screws are seen overlying the lower cervical spine. A left shoulder replacement is noted. Multilevel degenerative changes are present throughout the thoracic spine. IMPRESSION: 1. Very small left pleural effusion. 2. Stable moderate severity elevation of the right hemidiaphragm with subsequent right-sided volume loss. 3.  Mild, chronic appearing increased interstitial lung markings. Electronically Signed   By: Aram Candela M.D.   On: 10/09/2023 19:48   DG Pelvis 1-2 Views  Result Date: 10/09/2023 CLINICAL DATA:  Status post attempted reduction. EXAM: PELVIS - 1-2 VIEW COMPARISON:  Same day. FINDINGS: There appears to be superior dislocation of the left total hip arthroplasty. No definite fracture is noted. IMPRESSION: Superior dislocation of left total hip arthroplasty. Electronically Signed   By: Lupita Raider M.D.   On: 10/09/2023 17:30   DG Hip Unilat With Pelvis 2-3 Views Left  Result Date: 10/09/2023 CLINICAL DATA:  Fall. EXAM: DG HIP (WITH OR WITHOUT PELVIS) 2-3V LEFT COMPARISON:  November 05, 2021. FINDINGS: Status post bilateral total hip arthroplasties. There is noted posterior dislocation of the left femoral component. No definite fracture is noted. IMPRESSION: Posterior dislocation of left total hip arthroplasty. Electronically Signed   By: Lupita Raider M.D.   On: 10/09/2023 17:28    Procedures Procedures    Medications Ordered in ED Medications  ketorolac (TORADOL) 15 MG/ML injection 15 mg (15 mg Intravenous Given  10/09/23 1522)  propofol (DIPRIVAN) 10 mg/mL bolus/IV push 60.3 mg (60.3 mg Intravenous Given 10/09/23 1659)  PHENYLephrine 80 mcg/ml in normal saline Adult IV Push Syringe (For Blood Pressure Support) (160 mcg Intravenous Given 10/09/23 1700)  lactated ringers bolus 1,000 mL (0 mLs Intravenous Stopped 10/09/23 1832)  ketorolac (TORADOL) 15 MG/ML injection 15 mg (15 mg Intravenous Given 10/09/23 1928)  meclizine (ANTIVERT) tablet 25 mg (25 mg Oral Given 10/09/23 1931)  ondansetron (ZOFRAN) injection 4 mg (4 mg Intravenous Given 10/09/23 1941)    ED Course/ Medical Decision Making/ A&P                                 Medical Decision Making Amount and/or Complexity of Data Reviewed Labs: ordered. Radiology: ordered.  Risk Prescription drug management. Decision regarding hospitalization.   This patient presents to the ED with chief complaint(s) of left leg pain with pertinent past medical history of hip replacement which further complicates the presenting complaint. The complaint involves an extensive differential diagnosis and also carries with it a high risk of complications and morbidity.    The differential diagnosis includes hip fracture, hip dislocation, musculoskeletal pain  Additional history obtained: Additional history obtained from family Records reviewed Primary Care Documents  ED Course and Reassessment: IV Toradol for pain Attempted to reduce hip under sedation without success. Patient newly requiring 5 L nasal cannula to maintain oxygen saturation greater than 90  Independent labs interpretation:  The following labs were independently interpreted:  CBC: Mild anemia BMP: Elevated CO2 which is chronic, elevated bun which is chronic, mild hypocalcemia EKG: Sinus rhythm with old anterior infarct  Independent visualization of imaging: - I independently visualized the following imaging with scope of interpretation limited to determining acute life threatening  conditions related to emergency care: Left hip x-ray, which revealed displaced left hip prosthetic, Chest x-ray: Small left pleural effusion  Consultation: - Consulted or discussed management/test interpretation w/ external professional: Orthopedics, hospitalist  Consideration for admission or further workup: Being transferred for irreducible hip dislocation with high risk sedation     Final Clinical Impression(s) / ED Diagnoses Final diagnoses:  None    Rx / DC Orders ED Discharge Orders     None         Dolphus Jenny, PA-C 10/09/23 2006  Dolphus Jenny, PA-C 10/09/23 2250    Glendora Score, MD 10/10/23 [phone]

## 2023-10-09 NOTE — ED Notes (Signed)
Requested pain medication prior to transport per [facility] request.

## 2023-10-09 NOTE — ED Notes (Signed)
ED TO INPATIENT HANDOFF REPORT  ED Nurse Name and Phone #:  Marcello Moores [phone]  S Name/Age/Gender Tasha Moss 87 y.o. female Room/Bed: H012C/H012C  Code Status   Code Status: Limited: Do not attempt resuscitation (DNR) -DNR-LIMITED -Do Not Intubate/DNI   Home/SNF/Other Home Patient oriented to: self, place, time, and situation Is this baseline? Yes   Triage Complete: Triage complete  Chief Complaint Hip dislocation, left (HCC) [S73.005A]  Triage Note Pt slide out of wheelchair at [facility] and injured left leg. Pt has dislocated hip before and had hip replacement.    Allergies Allergies  Allergen Reactions   Gabapentin Nausea And Vomiting    hallucinations   Other Nausea And Vomiting    Strong pain medications   Morphine Nausea And Vomiting    Level of Care/Admitting Diagnosis ED Disposition     ED Disposition  Admit   Condition  --   Comment  Hospital Area: [facility] [[id number]]  Level of Care: Med-Surg [[id number]]  May admit patient to [facility] or [facility] if equivalent level of care is available:: No  Covid Evaluation: Asymptomatic - no recent exposure (last 10 days) testing not required  Diagnosis: Hip dislocation, left [facility]) [244010]  Admitting Physician: Buena Irish [[id number]]  Attending Physician: Buena Irish [[id number]]  Certification:: I certify this patient will need inpatient services for at least 2 midnights  Expected Medical Readiness: 10/11/2023          B Medical/Surgery History Past Medical History:  Diagnosis Date   Allergy    seasonal & dust   Anxiety    Arthritis    DJD   Cellulitis    Family history of adverse reaction to anesthesia    n/v    GERD (gastroesophageal reflux disease)    History of vertigo    Hyperlipidemia    Hypertension    Hypothyroidism    PONV (postoperative nausea and vomiting)    Thyroid disease    hypothyroidism post Hashimoto's & partial thyroidectomy   Past Surgical History:   Procedure Laterality Date   BIOPSY  01/11/2022   Procedure: BIOPSY;  Surgeon: Lemar Lofty., MD;  Location: [facility];  Service: Gastroenterology;;   CATARACT EXTRACTION W/ INTRAOCULAR LENS  IMPLANT, BILATERAL     COLONOSCOPY     diverticulosis   ESOPHAGOGASTRODUODENOSCOPY (EGD) WITH PROPOFOL N/A 01/11/2022   Procedure: ESOPHAGOGASTRODUODENOSCOPY (EGD) WITH PROPOFOL;  Surgeon: Lemar Lofty., MD;  Location: [facility];  Service: Gastroenterology;  Laterality: N/A;   GASTROSTOMY N/A 01/17/2022   Procedure: OPEN GASTROSTOMY PLACEMENT;  Surgeon: Harriette Bouillon, MD;  Location: [facility];  Service: General;  Laterality: N/A;   IR GASTROSTOMY TUBE MOD SED  01/15/2022   IR REPLACE G-TUBE SIMPLE WO FLUORO  03/13/2023   IR REPLACE G-TUBE SIMPLE WO FLUORO  10/06/2023   IR REPLC GASTRO/COLONIC TUBE PERCUT W/FLUORO  02/20/2023   JOINT REPLACEMENT  1995 & 1999   THR bilaterally    lumbar disc repair  1989   Dr Mercy Riding   SAVORY DILATION N/A 01/11/2022   Procedure: Gaspar Bidding DILATION;  Surgeon: Lemar Lofty., MD;  Location: [facility];  Service: Gastroenterology;  Laterality: N/A;   SPINE SURGERY     cervical ; Dr Venetia Maxon   THYROIDECTOMY, PARTIAL     benign nodule   TONSILLECTOMY AND ADENOIDECTOMY     TOTAL SHOULDER ARTHROPLASTY Left 02/27/2016   Procedure: LEFT REVERSE TOTAL SHOULDER ARTHROPLASTY;  Surgeon: Sheral Apley, MD;  Location: [facility];  Service: Orthopedics;  Laterality: Left;   vitreous detachment     Dr Nelle Don     A IV Location/Drains/Wounds Patient Lines/Drains/Airways Status     Active Line/Drains/Airways     Name Placement date Placement time Site Days   Peripheral IV 08/21/23 22 G Anterior;Left Forearm 08/21/23  1500  Forearm  49   Peripheral IV 10/09/23 22 G Anterior;Right Forearm 10/09/23  1522  Forearm  less than 1   Gastrostomy/Enterostomy Gastrostomy 24 Fr. LUQ 03/13/23  1534  LUQ  210   Pressure Injury 01/07/22 Perineum Right 01/07/22   2130  -- 640   Pressure Injury 09/03/22 Sacrum Medial Stage 2 -  Partial thickness loss of dermis presenting as a shallow open injury with a red, pink wound bed without slough. small pink open area in the middle of sacrum 09/03/22  --  -- 401   Pressure Injury 08/22/23 Sacrum Medial Stage 2 -  Partial thickness loss of dermis presenting as a shallow open injury with a red, pink wound bed without slough. Open area with partial thickness, redness around 08/22/23  1352  -- 48            Intake/Output Last 24 hours No intake or output data in the 24 hours ending 10/09/23 2328  Labs/Imaging Results for orders placed or performed during the hospital encounter of 10/09/23 (from the past 48 hour(s))  CBC with Differential     Status: Abnormal   Collection Time: 10/09/23  3:22 PM  Result Value Ref Range   WBC 6.1 4.0 - 10.5 K/uL   RBC 3.73 (L) 3.87 - 5.11 MIL/uL   Hemoglobin 11.5 (L) 12.0 - 15.0 g/dL   HCT 91.4 78.2 - 95.6 %   MCV 102.4 (H) 80.0 - 100.0 fL   MCH 30.8 26.0 - 34.0 pg   MCHC 30.1 30.0 - 36.0 g/dL   RDW 21.3 08.6 - 57.8 %   Platelets 209 150 - 400 K/uL   nRBC 0.0 0.0 - 0.2 %   Neutrophils Relative % 80 %   Neutro Abs 4.8 1.7 - 7.7 K/uL   Lymphocytes Relative 10 %   Lymphs Abs 0.6 (L) 0.7 - 4.0 K/uL   Monocytes Relative 8 %   Monocytes Absolute 0.5 0.1 - 1.0 K/uL   Eosinophils Relative 1 %   Eosinophils Absolute 0.1 0.0 - 0.5 K/uL   Basophils Relative 0 %   Basophils Absolute 0.0 0.0 - 0.1 K/uL   Immature Granulocytes 1 %   Abs Immature Granulocytes 0.06 0.00 - 0.07 K/uL    Comment: Performed at [facility], [address], [place], [place] [address]  Basic metabolic panel     Status: Abnormal   Collection Time: 10/09/23  3:22 PM  Result Value Ref Range   Sodium 145 135 - 145 mmol/L   Potassium 4.3 3.5 - 5.1 mmol/L   Chloride 100 98 - 111 mmol/L   CO2 35 (H) 22 - 32 mmol/L   Glucose, Bld 174 (H) 70 - 99 mg/dL    Comment: Glucose reference range applies only to  samples taken after fasting for at least 8 hours.   BUN 31 (H) 8 - 23 mg/dL   Creatinine, Ser 9.52 0.44 - 1.00 mg/dL   Calcium 8.4 (L) 8.9 - 10.3 mg/dL   GFR, Estimated >84 >13 mL/min    Comment: (NOTE) Calculated using the CKD-EPI Creatinine Equation (2021)    Anion gap 10 5 - 15    Comment: Performed at [employer]  [facility], [address] [place], [place] [address]   DG Chest Port 1 View  Result Date: 10/09/2023 CLINICAL DATA:  Hypoxia. EXAM: PORTABLE CHEST 1 VIEW COMPARISON:  August 21, 2023 FINDINGS: The heart size and mediastinal contours are within normal limits. Small surgical clips are seen along the superior mediastinum on the right. There is stable moderate severity elevation of the right hemidiaphragm with subsequent right-sided volume loss. A very small left pleural effusion is noted. Mild, chronic appearing increased interstitial lung markings are seen without evidence of focal consolidation or pneumothorax. A radiopaque fusion plate and screws are seen overlying the lower cervical spine. A left shoulder replacement is noted. Multilevel degenerative changes are present throughout the thoracic spine. IMPRESSION: 1. Very small left pleural effusion. 2. Stable moderate severity elevation of the right hemidiaphragm with subsequent right-sided volume loss. 3. Mild, chronic appearing increased interstitial lung markings. Electronically Signed   By: Aram Candela M.D.   On: 10/09/2023 19:48   DG Pelvis 1-2 Views  Result Date: 10/09/2023 CLINICAL DATA:  Status post attempted reduction. EXAM: PELVIS - 1-2 VIEW COMPARISON:  Same day. FINDINGS: There appears to be superior dislocation of the left total hip arthroplasty. No definite fracture is noted. IMPRESSION: Superior dislocation of left total hip arthroplasty. Electronically Signed   By: Lupita Raider M.D.   On: 10/09/2023 17:30   DG Hip Unilat With Pelvis 2-3 Views Left  Result Date: 10/09/2023 CLINICAL DATA:  Fall. EXAM: DG HIP  (WITH OR WITHOUT PELVIS) 2-3V LEFT COMPARISON:  November 05, 2021. FINDINGS: Status post bilateral total hip arthroplasties. There is noted posterior dislocation of the left femoral component. No definite fracture is noted. IMPRESSION: Posterior dislocation of left total hip arthroplasty. Electronically Signed   By: Lupita Raider M.D.   On: 10/09/2023 17:28    Pending Labs Unresulted Labs (From admission, onward)    None       Vitals/Pain Today's Vitals   10/09/23 1845 10/09/23 1857 10/09/23 2110 10/09/23 2149  BP: 126/64   119/62  Pulse: 91   78  Resp: (!) 23   19  Temp:   98.1 F (36.7 C)   TempSrc:   Oral   SpO2: 95%   95%  Weight:      Height:      PainSc:  0-No pain      Isolation Precautions No active isolations  Medications Medications  acetaminophen (TYLENOL) tablet 500 mg (has no administration in time range)    Or  acetaminophen (TYLENOL) suppository 650 mg (has no administration in time range)  ondansetron (ZOFRAN) tablet 4 mg (has no administration in time range)    Or  ondansetron (ZOFRAN) injection 4 mg (has no administration in time range)  sorbitol 70 % solution 30 mL (has no administration in time range)  melatonin tablet 3 mg (has no administration in time range)  midodrine (PROAMATINE) tablet 2.5 mg (has no administration in time range)  FLUoxetine (PROZAC) capsule 20 mg (has no administration in time range)  levothyroxine (SYNTHROID) tablet 112 mcg (has no administration in time range)  calcium carbonate (TUMS - dosed in mg elemental calcium) chewable tablet 200 mg of elemental calcium (has no administration in time range)  Omeprazole TBDD 40 mg (has no administration in time range)  ketorolac (TORADOL) 15 MG/ML injection 15 mg (15 mg Intravenous Given 10/09/23 1522)  propofol (DIPRIVAN) 10 mg/mL bolus/IV push 60.3 mg (60.3 mg Intravenous Given 10/09/23 1659)  PHENYLephrine 80 mcg/ml in normal saline  Adult IV Push Syringe (For Blood Pressure Support)  (160 mcg Intravenous Given 10/09/23 1700)  lactated ringers bolus 1,000 mL (0 mLs Intravenous Stopped 10/09/23 1832)  ketorolac (TORADOL) 15 MG/ML injection 15 mg (15 mg Intravenous Given 10/09/23 1928)  meclizine (ANTIVERT) tablet 25 mg (25 mg Oral Given 10/09/23 1931)  ondansetron (ZOFRAN) injection 4 mg (4 mg Intravenous Given 10/09/23 1941)    Mobility manual wheelchair     Focused Assessments     R Recommendations: See Admitting Provider Note  Report given to:   Additional Notes:

## 2023-10-10 ENCOUNTER — Encounter (HOSPITAL_COMMUNITY)
Admission: EM | Disposition: A | Discharge status: Home / Self Care | Payer: Self-pay | Source: Home / Self Care | Attending: Emergency Medicine

## 2023-10-10 ENCOUNTER — Observation Stay (HOSPITAL_COMMUNITY): Payer: Medicare Other

## 2023-10-10 ENCOUNTER — Encounter (HOSPITAL_COMMUNITY): Payer: Self-pay | Admitting: Internal Medicine

## 2023-10-10 ENCOUNTER — Inpatient Hospital Stay (HOSPITAL_COMMUNITY): Payer: Medicare Other

## 2023-10-10 ENCOUNTER — Inpatient Hospital Stay (HOSPITAL_COMMUNITY): Payer: Medicare Other | Admitting: Anesthesiology

## 2023-10-10 DIAGNOSIS — T84021A Dislocation of internal left hip prosthesis, initial encounter: Secondary | ICD-10-CM | POA: Diagnosis not present

## 2023-10-10 DIAGNOSIS — S73005A Unspecified dislocation of left hip, initial encounter: Secondary | ICD-10-CM

## 2023-10-10 DIAGNOSIS — I10 Essential (primary) hypertension: Secondary | ICD-10-CM

## 2023-10-10 DIAGNOSIS — Z66 Do not resuscitate: Secondary | ICD-10-CM | POA: Diagnosis not present

## 2023-10-10 DIAGNOSIS — Z96642 Presence of left artificial hip joint: Secondary | ICD-10-CM | POA: Diagnosis not present

## 2023-10-10 DIAGNOSIS — E039 Hypothyroidism, unspecified: Secondary | ICD-10-CM | POA: Diagnosis not present

## 2023-10-10 DIAGNOSIS — Z7189 Other specified counseling: Secondary | ICD-10-CM | POA: Diagnosis not present

## 2023-10-10 DIAGNOSIS — Z515 Encounter for palliative care: Secondary | ICD-10-CM

## 2023-10-10 DIAGNOSIS — I5032 Chronic diastolic (congestive) heart failure: Secondary | ICD-10-CM

## 2023-10-10 DIAGNOSIS — F03C0 Unspecified dementia, severe, without behavioral disturbance, psychotic disturbance, mood disturbance, and anxiety: Secondary | ICD-10-CM

## 2023-10-10 DIAGNOSIS — Z931 Gastrostomy status: Secondary | ICD-10-CM

## 2023-10-10 DIAGNOSIS — I110 Hypertensive heart disease with heart failure: Secondary | ICD-10-CM | POA: Diagnosis not present

## 2023-10-10 DIAGNOSIS — E440 Moderate protein-calorie malnutrition: Secondary | ICD-10-CM

## 2023-10-10 DIAGNOSIS — E785 Hyperlipidemia, unspecified: Secondary | ICD-10-CM

## 2023-10-10 HISTORY — PX: HIP CLOSED REDUCTION: SHX983

## 2023-10-10 LAB — SURGICAL PCR SCREEN
MRSA, PCR: POSITIVE — AB
Staphylococcus aureus: POSITIVE — AB

## 2023-10-10 LAB — VITAMIN B12: Vitamin B-12: 772 pg/mL (ref 180–914)

## 2023-10-10 LAB — FOLATE: Folate: 24.1 ng/mL (ref >5.9)

## 2023-10-10 SURGERY — CLOSED REDUCTION, HIP
Anesthesia: General | Site: Hip | Laterality: Left

## 2023-10-10 MED ORDER — ACETAMINOPHEN 160 MG/5ML PO SOLN: 325.0000 mg | ORAL | Status: DC | PRN

## 2023-10-10 MED ORDER — DOCUSATE SODIUM 100 MG PO CAPS
100.0000 mg | ORAL_CAPSULE | Freq: Two times a day (BID) | ORAL | Status: DC
  Administered 2023-10-10 – 2023-10-11 (×2): 100 mg via ORAL
  Filled 2023-10-10 (×2): qty 1

## 2023-10-10 MED ORDER — DEXAMETHASONE SODIUM PHOSPHATE 10 MG/ML IJ SOLN
INTRAMUSCULAR | Status: DC | PRN
  Administered 2023-10-10: 10 mg via INTRAVENOUS

## 2023-10-10 MED ORDER — JEVITY 1.5 CAL/FIBER PO LIQD
1000.0000 mL | ORAL | Status: DC
  Administered 2023-10-10: 1000 mL
  Filled 2023-10-10 (×2): qty 1000

## 2023-10-10 MED ORDER — FENTANYL CITRATE (PF) 100 MCG/2ML IJ SOLN: 25.0000 ug | INTRAMUSCULAR | Status: DC | PRN

## 2023-10-10 MED ORDER — POLYETHYLENE GLYCOL 3350 17 G PO PACK: 17.0000 g | PACK | Freq: Every day | ORAL | Status: DC | PRN

## 2023-10-10 MED ORDER — DEXTROSE-SODIUM CHLORIDE 5-0.45 % IV SOLN: Freq: Once | INTRAVENOUS | Status: AC

## 2023-10-10 MED ORDER — CHLORHEXIDINE GLUCONATE 4 % EX SOLN: 1.0000 | CUTANEOUS | 1 refills | Status: DC

## 2023-10-10 MED ORDER — ACETAMINOPHEN 325 MG PO TABS: 325.0000 mg | ORAL_TABLET | ORAL | Status: DC | PRN

## 2023-10-10 MED ORDER — MUPIROCIN 2 % EX OINT
1.0000 | TOPICAL_OINTMENT | Freq: Two times a day (BID) | CUTANEOUS | Status: DC
  Administered 2023-10-10 – 2023-10-11 (×3): 1 via NASAL
  Filled 2023-10-10 (×2): qty 22

## 2023-10-10 MED ORDER — CHLORHEXIDINE GLUCONATE 4 % EX SOLN: 60.0000 mL | Freq: Once | CUTANEOUS | Status: DC

## 2023-10-10 MED ORDER — MEPERIDINE HCL 25 MG/ML IJ SOLN: 6.2500 mg | INTRAMUSCULAR | Status: DC | PRN

## 2023-10-10 MED ORDER — ONDANSETRON HCL 4 MG/2ML IJ SOLN: 4.0000 mg | Freq: Once | INTRAMUSCULAR | Status: DC | PRN

## 2023-10-10 MED ORDER — FREE WATER
200.0000 mL | Status: DC
  Administered 2023-10-10 – 2023-10-11 (×6): 200 mL

## 2023-10-10 MED ORDER — OXYCODONE HCL 5 MG PO TABS: 5.0000 mg | ORAL_TABLET | Freq: Once | ORAL | Status: DC | PRN

## 2023-10-10 MED ORDER — METOCLOPRAMIDE HCL 5 MG/ML IJ SOLN: 5.0000 mg | Freq: Three times a day (TID) | INTRAMUSCULAR | Status: DC | PRN

## 2023-10-10 MED ORDER — OXYCODONE HCL 5 MG/5ML PO SOLN: 5.0000 mg | Freq: Once | ORAL | Status: DC | PRN

## 2023-10-10 MED ORDER — OXYCODONE HCL 5 MG PO TABS: 5.0000 mg | ORAL_TABLET | ORAL | Status: DC | PRN

## 2023-10-10 MED ORDER — PHENYLEPHRINE 80 MCG/ML (10ML) SYRINGE FOR IV PUSH (FOR BLOOD PRESSURE SUPPORT)
PREFILLED_SYRINGE | INTRAVENOUS | Status: DC | PRN
  Administered 2023-10-10 (×2): 160 ug via INTRAVENOUS

## 2023-10-10 MED ORDER — CHLORHEXIDINE GLUCONATE CLOTH 2 % EX PADS
6.0000 | MEDICATED_PAD | Freq: Every day | CUTANEOUS | Status: DC
  Administered 2023-10-10 – 2023-10-11 (×2): 6 via TOPICAL

## 2023-10-10 MED ORDER — METOCLOPRAMIDE HCL 5 MG PO TABS: 5.0000 mg | ORAL_TABLET | Freq: Three times a day (TID) | ORAL | Status: DC | PRN

## 2023-10-10 MED ORDER — ONDANSETRON HCL 4 MG/2ML IJ SOLN
INTRAMUSCULAR | Status: DC | PRN
  Administered 2023-10-10: 4 mg via INTRAVENOUS

## 2023-10-10 MED ORDER — CHLORHEXIDINE GLUCONATE 0.12 % MT SOLN
OROMUCOSAL | Status: AC
  Administered 2023-10-10: 15 mL via OROMUCOSAL
  Filled 2023-10-10: qty 15

## 2023-10-10 MED ORDER — MUPIROCIN 2 % EX OINT: 1.0000 | TOPICAL_OINTMENT | Freq: Two times a day (BID) | CUTANEOUS | 0 refills | Status: DC

## 2023-10-10 MED ORDER — CHLORHEXIDINE GLUCONATE 0.12 % MT SOLN: 15.0000 mL | Freq: Once | OROMUCOSAL | Status: AC

## 2023-10-10 MED ORDER — SUCCINYLCHOLINE CHLORIDE 200 MG/10ML IV SOSY
PREFILLED_SYRINGE | INTRAVENOUS | Status: DC | PRN
  Administered 2023-10-10: 140 mg via INTRAVENOUS

## 2023-10-10 MED ORDER — PROPOFOL 10 MG/ML IV BOLUS
INTRAVENOUS | Status: DC | PRN
  Administered 2023-10-10: 60 mg via INTRAVENOUS

## 2023-10-10 MED ORDER — ORAL CARE MOUTH RINSE: 15.0000 mL | Freq: Once | OROMUCOSAL | Status: AC

## 2023-10-10 MED ORDER — ASPIRIN 81 MG PO TBEC
81.0000 mg | DELAYED_RELEASE_TABLET | Freq: Every day | ORAL | Status: DC
  Administered 2023-10-11: 81 mg via ORAL
  Filled 2023-10-10: qty 1

## 2023-10-10 MED ORDER — LACTATED RINGERS IV SOLN: INTRAVENOUS | Status: DC | PRN

## 2023-10-10 MED ORDER — KETOROLAC TROMETHAMINE 15 MG/ML IJ SOLN
15.0000 mg | Freq: Four times a day (QID) | INTRAMUSCULAR | Status: AC | PRN
Start: 2023-10-10 — End: 2023-10-10
  Administered 2023-10-10: 15 mg via INTRAVENOUS
  Filled 2023-10-10: qty 1

## 2023-10-10 SURGICAL SUPPLY — 1 items: PILLOW ABDUCTION MEDIUM (MISCELLANEOUS) IMPLANT

## 2023-10-10 NOTE — H&P (View-Only) (Signed)
Reason for Consult:Left hip dislocation Referring Physician: Carma Leaven Time called: 0730 Time at bedside: 0910   Tasha Moss is an 87 y.o. female.  HPI: Tasha Moss slid out of her WC onto the ground yesterday. She was sent to the ED where workup showed a left THA dislocation. An attempt was made at [facility] in ED but was unsuccessful and orthopedic surgery was consulted. She lives at a SNF and is no longer ambulatory.   Past Medical History:  Diagnosis Date   Allergy    seasonal & dust   Anxiety    Arthritis    DJD   Cellulitis    Family history of adverse reaction to anesthesia    n/v    GERD (gastroesophageal reflux disease)    History of vertigo    Hyperlipidemia    Hypertension    Hypothyroidism    PONV (postoperative nausea and vomiting)    Thyroid disease    hypothyroidism post Hashimoto's & partial thyroidectomy    Past Surgical History:  Procedure Laterality Date   BIOPSY  01/11/2022   Procedure: BIOPSY;  Surgeon: Meridee Score Netty Starring., MD;  Location: [facility];  Service: Gastroenterology;;   CATARACT EXTRACTION W/ INTRAOCULAR LENS  IMPLANT, BILATERAL     COLONOSCOPY     diverticulosis   ESOPHAGOGASTRODUODENOSCOPY (EGD) WITH PROPOFOL N/A 01/11/2022   Procedure: ESOPHAGOGASTRODUODENOSCOPY (EGD) WITH PROPOFOL;  Surgeon: Lemar Lofty., MD;  Location: [facility];  Service: Gastroenterology;  Laterality: N/A;   GASTROSTOMY N/A 01/17/2022   Procedure: OPEN GASTROSTOMY PLACEMENT;  Surgeon: Harriette Bouillon, MD;  Location: [facility];  Service: General;  Laterality: N/A;   IR GASTROSTOMY TUBE MOD SED  01/15/2022   IR REPLACE G-TUBE SIMPLE WO FLUORO  03/13/2023   IR REPLACE G-TUBE SIMPLE WO FLUORO  10/06/2023   IR REPLC GASTRO/COLONIC TUBE PERCUT W/FLUORO  02/20/2023   JOINT REPLACEMENT  1995 & 1999   THR bilaterally    lumbar disc repair  1989   Dr Mercy Riding   SAVORY DILATION N/A 01/11/2022   Procedure: Gaspar Bidding DILATION;  Surgeon: Lemar Lofty., MD;   Location: [facility];  Service: Gastroenterology;  Laterality: N/A;   SPINE SURGERY     cervical ; Dr Venetia Maxon   THYROIDECTOMY, PARTIAL     benign nodule   TONSILLECTOMY AND ADENOIDECTOMY     TOTAL SHOULDER ARTHROPLASTY Left 02/27/2016   Procedure: LEFT REVERSE TOTAL SHOULDER ARTHROPLASTY;  Surgeon: Sheral Apley, MD;  Location: [facility];  Service: Orthopedics;  Laterality: Left;   vitreous detachment     Dr Nelle Don    Family History  Problem Relation Age of Onset   Diabetes Mother    Hepatitis Mother        Hepatitis B,? etiology   Miscarriages / Stillbirths Mother    Stroke Mother 93   Heart disease Father        CHF   Cancer Paternal Aunt        GI cancers   Heart disease Paternal Aunt        CHF   Cancer Paternal Uncle        lung, GI   Heart disease Paternal Uncle        CHF   Colon cancer Neg Hx    Esophageal cancer Neg Hx    Pancreatic cancer Neg Hx    Rectal cancer Neg Hx    Stomach cancer Neg Hx     Social History:  reports that she has never smoked. She has  never used smokeless tobacco. She reports that she does not drink alcohol and does not use drugs.  Allergies:  Allergies  Allergen Reactions   Gabapentin Nausea And Vomiting    hallucinations   Other Nausea And Vomiting    Strong pain medications   Morphine Nausea And Vomiting    Medications: I have reviewed the patient's current medications.  Results for orders placed or performed during the hospital encounter of 10/09/23 (from the past 48 hour(s))  CBC with Differential     Status: Abnormal   Collection Time: 10/09/23  3:22 PM  Result Value Ref Range   WBC 6.1 4.0 - 10.5 K/uL   RBC 3.73 (L) 3.87 - 5.11 MIL/uL   Hemoglobin 11.5 (L) 12.0 - 15.0 g/dL   HCT 40.9 81.1 - 91.4 %   MCV 102.4 (H) 80.0 - 100.0 fL   MCH 30.8 26.0 - 34.0 pg   MCHC 30.1 30.0 - 36.0 g/dL   RDW 78.2 95.6 - 21.3 %   Platelets 209 150 - 400 K/uL   nRBC 0.0 0.0 - 0.2 %   Neutrophils Relative % 80 %   Neutro Abs 4.8 1.7 -  7.7 K/uL   Lymphocytes Relative 10 %   Lymphs Abs 0.6 (L) 0.7 - 4.0 K/uL   Monocytes Relative 8 %   Monocytes Absolute 0.5 0.1 - 1.0 K/uL   Eosinophils Relative 1 %   Eosinophils Absolute 0.1 0.0 - 0.5 K/uL   Basophils Relative 0 %   Basophils Absolute 0.0 0.0 - 0.1 K/uL   Immature Granulocytes 1 %   Abs Immature Granulocytes 0.06 0.00 - 0.07 K/uL    Comment: Performed at [facility], [address] [place], [place] [address]  Basic metabolic panel     Status: Abnormal   Collection Time: 10/09/23  3:22 PM  Result Value Ref Range   Sodium 145 135 - 145 mmol/L   Potassium 4.3 3.5 - 5.1 mmol/L   Chloride 100 98 - 111 mmol/L   CO2 35 (H) 22 - 32 mmol/L   Glucose, Bld 174 (H) 70 - 99 mg/dL    Comment: Glucose reference range applies only to samples taken after fasting for at least 8 hours.   BUN 31 (H) 8 - 23 mg/dL   Creatinine, Ser 8.46 0.44 - 1.00 mg/dL   Calcium 8.4 (L) 8.9 - 10.3 mg/dL   GFR, Estimated >96 >29 mL/min    Comment: (NOTE) Calculated using the CKD-EPI Creatinine Equation (2021)    Anion gap 10 5 - 15    Comment: Performed at [facility], [address], [place], [place] [address]  Surgical pcr screen     Status: Abnormal   Collection Time: 10/10/23  1:06 AM   Specimen: Nasal Mucosa; Nasal Swab  Result Value Ref Range   MRSA, PCR POSITIVE (A) NEGATIVE   Staphylococcus aureus POSITIVE (A) NEGATIVE    Comment: RESULTS CALLED TO, READ BACK BY AND VERIFIED WITH RN [email or web address] ON 10/10/23 AT 0258 BY NM (NOTE) The Xpert SA Assay (FDA approved for NASAL specimens in patients 58 years of age and older), is one component of a comprehensive surveillance program. It is not intended to diagnose infection nor to guide or monitor treatment. Performed at [facility], [address], [place], [place] [address]     DG Chest Port 1 View  Result Date: 10/09/2023 CLINICAL DATA:  Hypoxia. EXAM: PORTABLE CHEST 1 VIEW COMPARISON:  August 21, 2023 FINDINGS: The heart size  and mediastinal contours are within normal limits. Small surgical clips are seen along the superior mediastinum on the right. There is stable moderate severity elevation of the right hemidiaphragm with subsequent right-sided volume loss. A very small left pleural effusion is noted. Mild, chronic appearing increased interstitial lung markings are seen without evidence of focal consolidation or pneumothorax. A radiopaque fusion plate and screws are seen overlying the lower cervical spine. A left shoulder replacement is noted. Multilevel degenerative changes are present throughout the thoracic spine. IMPRESSION: 1. Very small left pleural effusion. 2. Stable moderate severity elevation of the right hemidiaphragm with subsequent right-sided volume loss. 3. Mild, chronic appearing increased interstitial lung markings. Electronically Signed   By: Aram Candela M.D.   On: 10/09/2023 19:48   DG Pelvis 1-2 Views  Result Date: 10/09/2023 CLINICAL DATA:  Status post attempted reduction. EXAM: PELVIS - 1-2 VIEW COMPARISON:  Same day. FINDINGS: There appears to be superior dislocation of the left total hip arthroplasty. No definite fracture is noted. IMPRESSION: Superior dislocation of left total hip arthroplasty. Electronically Signed   By: Lupita Raider M.D.   On: 10/09/2023 17:30   DG Hip Unilat With Pelvis 2-3 Views Left  Result Date: 10/09/2023 CLINICAL DATA:  Fall. EXAM: DG HIP (WITH OR WITHOUT PELVIS) 2-3V LEFT COMPARISON:  November 05, 2021. FINDINGS: Status post bilateral total hip arthroplasties. There is noted posterior dislocation of the left femoral component. No definite fracture is noted. IMPRESSION: Posterior dislocation of left total hip arthroplasty. Electronically Signed   By: Lupita Raider M.D.   On: 10/09/2023 17:28    Review of Systems  HENT:  Negative for ear discharge, ear pain, hearing loss and tinnitus.   Eyes:  Negative for photophobia and pain.  Respiratory:  Negative for cough  and shortness of breath.   Cardiovascular:  Negative for chest pain.  Gastrointestinal:  Negative for abdominal pain, nausea and vomiting.  Genitourinary:  Negative for dysuria, flank pain, frequency and urgency.  Musculoskeletal:  Positive for arthralgias (Left hip). Negative for back pain, myalgias and neck pain.  Neurological:  Negative for dizziness and headaches.  Hematological:  Does not bruise/bleed easily.  Psychiatric/Behavioral:  The patient is not nervous/anxious.    Blood pressure (!) 97/51, pulse 87, temperature 98.3 F (36.8 C), temperature source Oral, resp. rate 16, height 5\' 5"  (1.651 m), weight 65.6 kg, SpO2 98%. Physical Exam Constitutional:      General: She is not in acute distress.    Appearance: She is well-developed. She is not diaphoretic.  HENT:     Head: Normocephalic and atraumatic.  Eyes:     General: No scleral icterus.       Right eye: No discharge.        Left eye: No discharge.     Conjunctiva/sclera: Conjunctivae normal.  Cardiovascular:     Rate and Rhythm: Normal rate and regular rhythm.  Pulmonary:     Effort: Pulmonary effort is normal. No respiratory distress.  Musculoskeletal:     Cervical back: Normal range of motion.     Comments: LLE No traumatic wounds, ecchymosis, or rash  Nontender  No knee or ankle effusion  Knee stable to varus/ valgus and anterior/posterior stress  Sens DPN, SPN, TN intact  Motor EHL, ext, flex, evers 5/5  DP 2+, PT 2+, No significant edema  Skin:    General: Skin is warm and dry.  Neurological:     Mental Status: She is alert.  Psychiatric:  Mood and Affect: Mood normal.        Behavior: Behavior normal.     Assessment/Plan: Left hip dislocation -- Plan CR in OR this afternoon with Dr. Jena Gauss. Please keep NPO. Multiple medical problems including hypothyroidism, hypertension, GERD, hyperlipidemia and PEG tube -- per primary service    Freeman Caldron, PA-C Orthopedic  Surgery [phone] 10/10/2023, 9:22 AM

## 2023-10-10 NOTE — Plan of Care (Signed)
  Problem: Clinical Measurements: Goal: Ability to maintain clinical measurements within normal limits will improve Outcome: Progressing Goal: Will remain free from infection Outcome: Progressing Goal: Diagnostic test results will improve Outcome: Progressing Goal: Respiratory complications will improve Outcome: Progressing Goal: Cardiovascular complication will be avoided Outcome: Progressing   Problem: Activity: Goal: Risk for activity intolerance will decrease Outcome: Progressing   Problem: Nutrition: Goal: Adequate nutrition will be maintained Outcome: Progressing   Problem: Coping: Goal: Level of anxiety will decrease Outcome: Progressing   Problem: Pain Management: Goal: General experience of comfort will improve Outcome: Progressing

## 2023-10-10 NOTE — Progress Notes (Signed)
OT Cancellation Note  Patient Details Name: Tasha Moss MRN: [id number] DOB: May 16, 1936   Cancelled Treatment:    Reason Eval/Treat Not Completed: Medical issues which prohibited therapy. Plan is for pt to go to OR this afternoon for closed reduction of hip. Will plan to f/u for OT evaluation tomorrow.   Jacee Enerson D Everardo Voris 10/10/2023, 10:46 AM 10/10/2023  RP, OTR/L  Acute Rehabilitation Services  Office:  [phone]

## 2023-10-10 NOTE — Anesthesia Preprocedure Evaluation (Addendum)
Anesthesia Evaluation  Patient identified by MRN, date of birth, ID band Patient awake    Reviewed: Allergy & Precautions, H&P , NPO status , Patient's Chart, lab work & pertinent test results  History of Anesthesia Complications (+) PONV, Family history of anesthesia reaction and history of anesthetic complications  Airway Mallampati: II  TM Distance: >3 FB Neck ROM: Full    Dental  (+) Dental Advisory Given, Teeth Intact   Pulmonary neg pulmonary ROS   Pulmonary exam normal        Cardiovascular Exercise Tolerance: Good hypertension, Normal cardiovascular examMR   Echo 01/09/2022 1. Left ventricular ejection fraction by 3D volume is 65 %. The left ventricle has normal function. The left ventricle has no regional wall motion abnormalities. There is mild concentric left ventricular hypertrophy. Left ventricular diastolic parameters are consistent with Grade I diastolic dysfunction (impaired relaxation).  2. Right ventricular systolic function is normal. The right ventricular size is normal. Tricuspid regurgitation signal is inadequate for assessing PA pressure.  3. The mitral valve is normal in structure. Mild to moderate mitral valve regurgitation. No evidence of mitral stenosis.  4. The aortic valve is normal in structure. Aortic valve regurgitation is trivial. No aortic stenosis is present.  5. The inferior vena cava is normal in size with greater than 50% respiratory variability, suggesting right atrial pressure of 3 mmHg.    Neuro/Psych   Anxiety     negative neurological ROS  negative psych ROS   GI/Hepatic negative GI ROS, Neg liver ROS,  Medicated,,  Endo/Other  negative endocrine ROS    Renal/GU negative Renal ROS  negative genitourinary   Musculoskeletal  (+) Arthritis ,    Abdominal Normal abdominal exam  (+)   Peds  Hematology negative hematology ROS (+) Blood dyscrasia, anemia   Anesthesia Other  Findings   Reproductive/Obstetrics negative OB ROS                             Anesthesia Physical Anesthesia Plan  ASA: 4 and emergent  Anesthesia Plan: General   Post-op Pain Management: GA combined w/ Regional for post-op pain and Minimal or no pain anticipated   Induction: Intravenous  PONV Risk Score and Plan: 3 and TIVA, Treatment may vary due to age or medical condition and Ondansetron  Airway Management Planned: Oral ETT  Additional Equipment: None  Intra-op Plan:   Post-operative Plan: Extubation in OR  Informed Consent: I have reviewed the patients History and Physical, chart, labs and discussed the procedure including the risks, benefits and alternatives for the proposed anesthesia with the patient or authorized representative who has indicated his/her understanding and acceptance.    Discussed DNR with power of attorney and Continue DNR.   Dental advisory given  Plan Discussed with: CRNA and Anesthesiologist  Anesthesia Plan Comments: (Drugs and Intubation OK.  NO CPR !  )        Anesthesia Quick Evaluation

## 2023-10-10 NOTE — Transfer of Care (Signed)
Immediate Anesthesia Transfer of Care Note  Patient: Tasha Moss  Procedure(s) Performed: CLOSED REDUCTION HIP (Left: Hip)  Patient Location: PACU  Anesthesia Type:General  Level of Consciousness: alert  and oriented  Airway & Oxygen Therapy: Patient Spontanous Breathing and Patient connected to face mask oxygen  Post-op Assessment: Report given to RN and Post -op Vital signs reviewed and stable  Post vital signs: Reviewed and stable  Last Vitals:  Vitals Value Taken Time  BP 116/60 10/10/23 1507  Temp    Pulse 82 10/10/23 1513  Resp 29 10/10/23 1513  SpO2 94 % 10/10/23 1513  Vitals shown include unfiled device data.  Last Pain:  Vitals:   10/10/23 1304  TempSrc: Axillary  PainSc:          Complications: No notable events documented.

## 2023-10-10 NOTE — Progress Notes (Addendum)
PROGRESS NOTE    Tasha Moss  ZSW:[id number] DOB: 02/25/1936 DOA: 10/09/2023 PCP: Sharee Holster, NP   Brief Narrative:  Tasha Moss is a 87 y.o. female with medical history significant for hypothyroidism, hypotension, GERD, hyperlipidemia and PEG tube who was sent in from the nursing home after she slid out of her wheelchair and hit the ground with notable L hip dislocation. This is her second dislocation (last 2 years ago) and has been gradually declining since that time. More recently she's become essentially bed/wheelchair bound due to ongoing weakness/instability.  Assessment & Plan:   Principal Problem:   Hip dislocation, left (HCC)   Left hip dislocation, recurrent Previous 2 years ago without complication on fixture Orthopedic surgery tentatively planned for procedure this evening (after 5)   Chronic enteral feeding via G tube Malnutrition, chronic Functionally NPO Resume tube feeds post-operatively Occasional ice chips PO per family - nutrition/free water via G tube  On IVF 500cc/10hr   Chronic diastolic heart failue - not in acute exacerbation. - resume home furosemide once volume status improves  Hypothyroidism -continue Synthroid Hypotension/HLD- Continue home regimen - midodrine; statin Unspecified anxiety/depression - Complicated by dementia - resume home fluoxetine  DVT prophylaxis:  Code Status:   Code Status: Limited: Do not attempt resuscitation (DNR) -DNR-LIMITED -Do Not Intubate/DNI   Family Communication: At bedside (husband/daughters)  Status is: INpt  Dispo: The patient is from: Facility              Anticipated d/c is to: SNF              Anticipated d/c date is: 48-72h              Patient currently NOT medically stable for discharge  Consultants:  Ortho  Procedures:  Hip reduction, left,10/10/23  Antimicrobials:  Perioperatively   Subjective: No acute issues/events overnight - pain well controlled  Objective: Vitals:    10/10/23 0058 10/10/23 0100 10/10/23 0544 10/10/23 0741  BP: (!) 111/55  (!) 110/54 (!) 97/51  Pulse: 99  90 87  Resp: 17  16 16   Temp: 99.6 F (37.6 C)  98.9 F (37.2 C) 98.3 F (36.8 C)  TempSrc: Oral   Oral  SpO2: 91%  97% 98%  Weight:  65.6 kg    Height:  5\' 5"  (1.651 m)      Intake/Output Summary (Last 24 hours) at 10/10/2023 0814 Last data filed at 10/10/2023 0202 Gross per 24 hour  Intake 0 ml  Output 0 ml  Net 0 ml   Filed Weights   10/09/23 1502 10/10/23 0100  Weight: 60.3 kg 65.6 kg    Examination:  General exam: Appears calm and comfortable  Respiratory system: Clear to auscultation. Respiratory effort normal. Cardiovascular system: S1 & S2 heard, RRR. No JVD, murmurs, rubs, gallops or clicks. No pedal edema. Gastrointestinal system: Abdomen is nondistended, soft and nontender. No organomegaly or masses felt. Normal bowel sounds heard. Central nervous system: Alert and oriented. No focal neurological deficits. Extremities: Symmetric 5 x 5 power. Skin: No rashes, lesions or ulcers Psychiatry: Judgement and insight appear normal. Mood & affect appropriate.     Data Reviewed: I have personally reviewed following labs and imaging studies  CBC: Recent Labs  Lab 10/09/23 1522  WBC 6.1  NEUTROABS 4.8  HGB 11.5*  HCT 38.2  MCV 102.4*  PLT 209   Basic Metabolic Panel: Recent Labs  Lab 10/09/23 1522  NA 145  K 4.3  CL 100  CO2 35*  GLUCOSE 174*  BUN 31*  CREATININE 0.53  CALCIUM 8.4*   GFR: Estimated Creatinine Clearance: 46.3 mL/min (by C-G formula based on SCr of 0.53 mg/dL).  Recent Results (from the past 240 hour(s))  Surgical pcr screen     Status: Abnormal   Collection Time: 10/10/23  1:06 AM   Specimen: Nasal Mucosa; Nasal Swab  Result Value Ref Range Status   MRSA, PCR POSITIVE (A) NEGATIVE Final   Staphylococcus aureus POSITIVE (A) NEGATIVE Final    Comment: RESULTS CALLED TO, READ BACK BY AND VERIFIED WITH RN Q.WINSTON ON 10/10/23 AT  0258 BY NM (NOTE) The Xpert SA Assay (FDA approved for NASAL specimens in patients 70 years of age and older), is one component of a comprehensive surveillance program. It is not intended to diagnose infection nor to guide or monitor treatment. Performed at [facility], [address] [place], [place] [address]      Radiology Studies: DG Chest Port 1 View  Result Date: 10/09/2023 CLINICAL DATA:  Hypoxia. EXAM: PORTABLE CHEST 1 VIEW COMPARISON:  August 21, 2023 FINDINGS: The heart size and mediastinal contours are within normal limits. Small surgical clips are seen along the superior mediastinum on the right. There is stable moderate severity elevation of the right hemidiaphragm with subsequent right-sided volume loss. A very small left pleural effusion is noted. Mild, chronic appearing increased interstitial lung markings are seen without evidence of focal consolidation or pneumothorax. A radiopaque fusion plate and screws are seen overlying the lower cervical spine. A left shoulder replacement is noted. Multilevel degenerative changes are present throughout the thoracic spine. IMPRESSION: 1. Very small left pleural effusion. 2. Stable moderate severity elevation of the right hemidiaphragm with subsequent right-sided volume loss. 3. Mild, chronic appearing increased interstitial lung markings. Electronically Signed   By: Aram Candela M.D.   On: 10/09/2023 19:48   DG Pelvis 1-2 Views  Result Date: 10/09/2023 CLINICAL DATA:  Status post attempted reduction. EXAM: PELVIS - 1-2 VIEW COMPARISON:  Same day. FINDINGS: There appears to be superior dislocation of the left total hip arthroplasty. No definite fracture is noted. IMPRESSION: Superior dislocation of left total hip arthroplasty. Electronically Signed   By: Lupita Raider M.D.   On: 10/09/2023 17:30   DG Hip Unilat With Pelvis 2-3 Views Left  Result Date: 10/09/2023 CLINICAL DATA:  Fall. EXAM: DG HIP (WITH OR WITHOUT  PELVIS) 2-3V LEFT COMPARISON:  November 05, 2021. FINDINGS: Status post bilateral total hip arthroplasties. There is noted posterior dislocation of the left femoral component. No definite fracture is noted. IMPRESSION: Posterior dislocation of left total hip arthroplasty. Electronically Signed   By: Lupita Raider M.D.   On: 10/09/2023 17:28    Scheduled Meds:  calcium carbonate  1 tablet Per Tube QAC supper   Chlorhexidine Gluconate Cloth  6 each Topical Q0600   FLUoxetine  20 mg Per Tube Daily   levothyroxine  112 mcg Per Tube Daily   midodrine  2.5 mg Per J Tube TID WC   mupirocin ointment  1 Application Nasal BID   Omeprazole  40 mg Oral Daily   Continuous Infusions:   LOS: 1 day   Time spent:  Azucena Fallen, DO [facility]  If 7PM-7AM, please contact night-coverage [email or web address]  10/10/2023, 8:14 AM

## 2023-10-10 NOTE — Progress Notes (Signed)
PT Cancellation Note  Patient Details Name: Tasha Moss MRN: [id number] DOB: 09/29/1936   Cancelled Treatment:    Reason Eval/Treat Not Completed: Patient not medically ready. Plan is for pt to go to OR this afternoon for closed reduction of hip.  Will plan to f/u for PT evaluation tomorrow.     Donnella Sham 10/10/2023, 9:39 AM Lavona Mound, PT   Acute Rehabilitation Services  Office [phone] 10/10/2023

## 2023-10-10 NOTE — Plan of Care (Signed)
  Problem: Health Behavior/Discharge Planning: Goal: Ability to manage health-related needs will improve Outcome: Progressing   Problem: Clinical Measurements: Goal: Respiratory complications will improve Outcome: Progressing   Problem: Elimination: Goal: Will not experience complications related to urinary retention Outcome: Progressing   Problem: Skin Integrity: Goal: Risk for impaired skin integrity will decrease Outcome: Progressing

## 2023-10-10 NOTE — Anesthesia Postprocedure Evaluation (Signed)
Anesthesia Post Note  Patient: NADIAH CORBIT  Procedure(s) Performed: CLOSED REDUCTION HIP (Left: Hip)     Patient location during evaluation: PACU Anesthesia Type: General Level of consciousness: awake and alert Pain management: pain level controlled Vital Signs Assessment: post-procedure vital signs reviewed and stable Respiratory status: spontaneous breathing, nonlabored ventilation, respiratory function stable and patient connected to nasal cannula oxygen Cardiovascular status: blood pressure returned to baseline and stable Postop Assessment: no apparent nausea or vomiting Anesthetic complications: no  No notable events documented.  Last Vitals:  Vitals:   10/10/23 1545 10/10/23 1615  BP: (!) 121/54 (!) 110/55  Pulse: 82 76  Resp: (!) 24 16  Temp: (!) 36.3 C 36.8 C  SpO2: 93% 98%    Last Pain:  Vitals:   10/10/23 1615  TempSrc: Oral  PainSc:                  Sanita Estrada

## 2023-10-10 NOTE — Interval H&P Note (Signed)
History and Physical Interval Note:  10/10/2023 2:04 PM  Tasha Moss  has presented today for surgery, with the diagnosis of LEFT THA DISLOCATION.  The various methods of treatment have been discussed with the patient and family. After consideration of risks, benefits and other options for treatment, the patient has consented to  Procedure(s): CLOSED REDUCTION HIP (Left) as a surgical intervention.  The patient's history has been reviewed, patient examined, no change in status, stable for surgery.  I have reviewed the patient's chart and labs.  Questions were answered to the patient's satisfaction.     Caryn Bee P Elizibeth Breau

## 2023-10-10 NOTE — Consult Note (Signed)
Reason for Consult:Left hip dislocation Referring Physician: Carma Leaven Time called: 0730 Time at bedside: 0910   Tasha Moss is an 87 y.o. female.  HPI: Tasha Moss slid out of her WC onto the ground yesterday. She was sent to the ED where workup showed a left THA dislocation. An attempt was made at [facility] in ED but was unsuccessful and orthopedic surgery was consulted. She lives at a SNF and is no longer ambulatory.   Past Medical History:  Diagnosis Date   Allergy    seasonal & dust   Anxiety    Arthritis    DJD   Cellulitis    Family history of adverse reaction to anesthesia    n/v    GERD (gastroesophageal reflux disease)    History of vertigo    Hyperlipidemia    Hypertension    Hypothyroidism    PONV (postoperative nausea and vomiting)    Thyroid disease    hypothyroidism post Hashimoto's & partial thyroidectomy    Past Surgical History:  Procedure Laterality Date   BIOPSY  01/11/2022   Procedure: BIOPSY;  Surgeon: Meridee Score Netty Starring., MD;  Location: [facility];  Service: Gastroenterology;;   CATARACT EXTRACTION W/ INTRAOCULAR LENS  IMPLANT, BILATERAL     COLONOSCOPY     diverticulosis   ESOPHAGOGASTRODUODENOSCOPY (EGD) WITH PROPOFOL N/A 01/11/2022   Procedure: ESOPHAGOGASTRODUODENOSCOPY (EGD) WITH PROPOFOL;  Surgeon: Lemar Lofty., MD;  Location: [facility];  Service: Gastroenterology;  Laterality: N/A;   GASTROSTOMY N/A 01/17/2022   Procedure: OPEN GASTROSTOMY PLACEMENT;  Surgeon: Harriette Bouillon, MD;  Location: [facility];  Service: General;  Laterality: N/A;   IR GASTROSTOMY TUBE MOD SED  01/15/2022   IR REPLACE G-TUBE SIMPLE WO FLUORO  03/13/2023   IR REPLACE G-TUBE SIMPLE WO FLUORO  10/06/2023   IR REPLC GASTRO/COLONIC TUBE PERCUT W/FLUORO  02/20/2023   JOINT REPLACEMENT  1995 & 1999   THR bilaterally    lumbar disc repair  1989   Dr Mercy Riding   SAVORY DILATION N/A 01/11/2022   Procedure: Gaspar Bidding DILATION;  Surgeon: Lemar Lofty., MD;   Location: [facility];  Service: Gastroenterology;  Laterality: N/A;   SPINE SURGERY     cervical ; Dr Venetia Maxon   THYROIDECTOMY, PARTIAL     benign nodule   TONSILLECTOMY AND ADENOIDECTOMY     TOTAL SHOULDER ARTHROPLASTY Left 02/27/2016   Procedure: LEFT REVERSE TOTAL SHOULDER ARTHROPLASTY;  Surgeon: Sheral Apley, MD;  Location: [facility];  Service: Orthopedics;  Laterality: Left;   vitreous detachment     Dr Nelle Don    Family History  Problem Relation Age of Onset   Diabetes Mother    Hepatitis Mother        Hepatitis B,? etiology   Miscarriages / Stillbirths Mother    Stroke Mother 93   Heart disease Father        CHF   Cancer Paternal Aunt        GI cancers   Heart disease Paternal Aunt        CHF   Cancer Paternal Uncle        lung, GI   Heart disease Paternal Uncle        CHF   Colon cancer Neg Hx    Esophageal cancer Neg Hx    Pancreatic cancer Neg Hx    Rectal cancer Neg Hx    Stomach cancer Neg Hx     Social History:  reports that she has never smoked. She has  never used smokeless tobacco. She reports that she does not drink alcohol and does not use drugs.  Allergies:  Allergies  Allergen Reactions   Gabapentin Nausea And Vomiting    hallucinations   Other Nausea And Vomiting    Strong pain medications   Morphine Nausea And Vomiting    Medications: I have reviewed the patient's current medications.  Results for orders placed or performed during the hospital encounter of 10/09/23 (from the past 48 hour(s))  CBC with Differential     Status: Abnormal   Collection Time: 10/09/23  3:22 PM  Result Value Ref Range   WBC 6.1 4.0 - 10.5 K/uL   RBC 3.73 (L) 3.87 - 5.11 MIL/uL   Hemoglobin 11.5 (L) 12.0 - 15.0 g/dL   HCT 40.9 81.1 - 91.4 %   MCV 102.4 (H) 80.0 - 100.0 fL   MCH 30.8 26.0 - 34.0 pg   MCHC 30.1 30.0 - 36.0 g/dL   RDW 78.2 95.6 - 21.3 %   Platelets 209 150 - 400 K/uL   nRBC 0.0 0.0 - 0.2 %   Neutrophils Relative % 80 %   Neutro Abs 4.8 1.7 -  7.7 K/uL   Lymphocytes Relative 10 %   Lymphs Abs 0.6 (L) 0.7 - 4.0 K/uL   Monocytes Relative 8 %   Monocytes Absolute 0.5 0.1 - 1.0 K/uL   Eosinophils Relative 1 %   Eosinophils Absolute 0.1 0.0 - 0.5 K/uL   Basophils Relative 0 %   Basophils Absolute 0.0 0.0 - 0.1 K/uL   Immature Granulocytes 1 %   Abs Immature Granulocytes 0.06 0.00 - 0.07 K/uL    Comment: Performed at [facility], [address] [place], [place] [address]  Basic metabolic panel     Status: Abnormal   Collection Time: 10/09/23  3:22 PM  Result Value Ref Range   Sodium 145 135 - 145 mmol/L   Potassium 4.3 3.5 - 5.1 mmol/L   Chloride 100 98 - 111 mmol/L   CO2 35 (H) 22 - 32 mmol/L   Glucose, Bld 174 (H) 70 - 99 mg/dL    Comment: Glucose reference range applies only to samples taken after fasting for at least 8 hours.   BUN 31 (H) 8 - 23 mg/dL   Creatinine, Ser 8.46 0.44 - 1.00 mg/dL   Calcium 8.4 (L) 8.9 - 10.3 mg/dL   GFR, Estimated >96 >29 mL/min    Comment: (NOTE) Calculated using the CKD-EPI Creatinine Equation (2021)    Anion gap 10 5 - 15    Comment: Performed at [facility], [address], [place], [place] [address]  Surgical pcr screen     Status: Abnormal   Collection Time: 10/10/23  1:06 AM   Specimen: Nasal Mucosa; Nasal Swab  Result Value Ref Range   MRSA, PCR POSITIVE (A) NEGATIVE   Staphylococcus aureus POSITIVE (A) NEGATIVE    Comment: RESULTS CALLED TO, READ BACK BY AND VERIFIED WITH RN Q.WINSTON ON 10/10/23 AT 0258 BY NM (NOTE) The Xpert SA Assay (FDA approved for NASAL specimens in patients 58 years of age and older), is one component of a comprehensive surveillance program. It is not intended to diagnose infection nor to guide or monitor treatment. Performed at [facility], [address], [place], [place] [address]     DG Chest Port 1 View  Result Date: 10/09/2023 CLINICAL DATA:  Hypoxia. EXAM: PORTABLE CHEST 1 VIEW COMPARISON:  August 21, 2023 FINDINGS: The heart size  and mediastinal contours are within normal limits. Small surgical clips are seen along the superior mediastinum on the right. There is stable moderate severity elevation of the right hemidiaphragm with subsequent right-sided volume loss. A very small left pleural effusion is noted. Mild, chronic appearing increased interstitial lung markings are seen without evidence of focal consolidation or pneumothorax. A radiopaque fusion plate and screws are seen overlying the lower cervical spine. A left shoulder replacement is noted. Multilevel degenerative changes are present throughout the thoracic spine. IMPRESSION: 1. Very small left pleural effusion. 2. Stable moderate severity elevation of the right hemidiaphragm with subsequent right-sided volume loss. 3. Mild, chronic appearing increased interstitial lung markings. Electronically Signed   By: Aram Candela M.D.   On: 10/09/2023 19:48   DG Pelvis 1-2 Views  Result Date: 10/09/2023 CLINICAL DATA:  Status post attempted reduction. EXAM: PELVIS - 1-2 VIEW COMPARISON:  Same day. FINDINGS: There appears to be superior dislocation of the left total hip arthroplasty. No definite fracture is noted. IMPRESSION: Superior dislocation of left total hip arthroplasty. Electronically Signed   By: Lupita Raider M.D.   On: 10/09/2023 17:30   DG Hip Unilat With Pelvis 2-3 Views Left  Result Date: 10/09/2023 CLINICAL DATA:  Fall. EXAM: DG HIP (WITH OR WITHOUT PELVIS) 2-3V LEFT COMPARISON:  November 05, 2021. FINDINGS: Status post bilateral total hip arthroplasties. There is noted posterior dislocation of the left femoral component. No definite fracture is noted. IMPRESSION: Posterior dislocation of left total hip arthroplasty. Electronically Signed   By: Lupita Raider M.D.   On: 10/09/2023 17:28    Review of Systems  HENT:  Negative for ear discharge, ear pain, hearing loss and tinnitus.   Eyes:  Negative for photophobia and pain.  Respiratory:  Negative for cough  and shortness of breath.   Cardiovascular:  Negative for chest pain.  Gastrointestinal:  Negative for abdominal pain, nausea and vomiting.  Genitourinary:  Negative for dysuria, flank pain, frequency and urgency.  Musculoskeletal:  Positive for arthralgias (Left hip). Negative for back pain, myalgias and neck pain.  Neurological:  Negative for dizziness and headaches.  Hematological:  Does not bruise/bleed easily.  Psychiatric/Behavioral:  The patient is not nervous/anxious.    Blood pressure (!) 97/51, pulse 87, temperature 98.3 F (36.8 C), temperature source Oral, resp. rate 16, height 5\' 5"  (1.651 m), weight 65.6 kg, SpO2 98%. Physical Exam Constitutional:      General: She is not in acute distress.    Appearance: She is well-developed. She is not diaphoretic.  HENT:     Head: Normocephalic and atraumatic.  Eyes:     General: No scleral icterus.       Right eye: No discharge.        Left eye: No discharge.     Conjunctiva/sclera: Conjunctivae normal.  Cardiovascular:     Rate and Rhythm: Normal rate and regular rhythm.  Pulmonary:     Effort: Pulmonary effort is normal. No respiratory distress.  Musculoskeletal:     Cervical back: Normal range of motion.     Comments: LLE No traumatic wounds, ecchymosis, or rash  Nontender  No knee or ankle effusion  Knee stable to varus/ valgus and anterior/posterior stress  Sens DPN, SPN, TN intact  Motor EHL, ext, flex, evers 5/5  DP 2+, PT 2+, No significant edema  Skin:    General: Skin is warm and dry.  Neurological:     Mental Status: She is alert.  Psychiatric:  Mood and Affect: Mood normal.        Behavior: Behavior normal.     Assessment/Plan: Left hip dislocation -- Plan CR in OR this afternoon with Dr. Jena Gauss. Please keep NPO. Multiple medical problems including hypothyroidism, hypertension, GERD, hyperlipidemia and PEG tube -- per primary service    Freeman Caldron, PA-C Orthopedic  Surgery [phone] 10/10/2023, 9:22 AM

## 2023-10-10 NOTE — Op Note (Signed)
Orthopaedic Surgery Operative Note (CSN: [id number] ) Date of Surgery: 10/10/2023  Admit Date: 10/09/2023   Diagnoses: Pre-Op Diagnoses: Left periprosthetic hip dislocation  Post-Op Diagnosis: Same  Procedures: CPT 27266-Closed reduction of left hip dislocation  Surgeons : Primary: Roby Lofts, MD  Assistant: Ulyses Southward, PA-C  Location: OR 3   Anesthesia:General   Antibiotics: None  Tourniquet time: None    Estimated Blood Loss: None  Complications:* No complications entered in OR log *   Specimens:* No specimens in log *   Implants: * No implants in log *   Indications for Surgery: 87 year old female who sustained a left periprosthetic hip dislocation.  An attempt was made to close reduction in the emergency room but this was unsuccessful.  She was subsequently transferred to [facility].  Due to the continued dislocation I recommended proceeding with closed reduction.  Risks and benefits were discussed with the patient's daughter.  Risks include but not limited to persistent dislocation, instability, need for further surgery, even possibility anesthetic complications.  She agreed proceed with surgery and consent was obtained.  Operative Findings: Successful closed reduction of left periprosthetic hip dislocation  Procedure: The patient was identified in the preoperative holding area. Consent was confirmed with the patient and their family and all questions were answered. The operative extremity was marked after confirmation with the patient. she was then brought back to the operating room by our anesthesia colleagues.  She was placed under general anesthetic and carefully transferred over to radiolucent flattop table.  Fluoroscopic imaging was obtained to show the dislocation.  A timeout was performed to verify the patient, the procedure, and the extremity.  Preoperative antibiotics were not dosed due to the closed nature of her procedure.  Countertraction  was applied to the pelvis and the hip was flexed internally rotated and traction was applied after a few attempts a palpable and audible clunk was made fluoroscopic imaging showed that the hip was reduced concentrically.  And the leg lengths were symmetric.  Gentle hip flexion and internal rotation was performed which showed no recurrent instability.  As a result I placed a hip abduction pillow.  The patient was awoken from anesthesia and taken to the PACU in stable condition.  Post Op Plan/Instructions: The patient will be with posterior hip precautions.  She will be weightbearing as tolerated.  Will have her mobilize with physical and Occupational Therapy.  No DVT prophylaxis is needed from an orthopedic perspective.  I was present and performed the entire surgery.  Ulyses Southward, PA-C did assist me throughout the case. An assistant was necessary given the difficulty in approach, maintenance of reduction and ability to instrument the fracture.   Truitt Merle, MD [facility]

## 2023-10-10 NOTE — TOC Progression Note (Signed)
Transition of Care [facility]) - Progression Note    Patient Details  Name: Tasha Moss MRN: [id number] Date of Birth: 1936/06/11  Transition of Care [facility]) CM/SW Contact  Erin Sons, [place] Phone Number: 10/10/2023, 3:38 PM  Clinical Narrative:     CSW notified by palliative that pt's daughter is requesting written info about hospice and medicare coverage. CSW left medicare hospice coverage info and contact info for [facility] in room, 5N30         Social Determinants of Health (SDOH) Interventions SDOH Screenings   Food Insecurity: No Food Insecurity (10/10/2023)  Housing: Low Risk  (10/10/2023)  Transportation Needs: No Transportation Needs (10/10/2023)  Utilities: Not At Risk (10/10/2023)  Alcohol Screen: Low Risk  (02/04/2023)  Depression (PHQ2-9): Low Risk  (02/04/2023)  Financial Resource Strain: Low Risk  (02/04/2023)  Physical Activity: Sufficiently Active (02/04/2023)  Social Connections: Socially Integrated (02/04/2023)  Stress: No Stress Concern Present (02/04/2023)  Tobacco Use: Low Risk  (10/10/2023)    Readmission Risk Interventions     No data to display

## 2023-10-10 NOTE — H&P (Signed)
History and Physical    Patient: Tasha Moss WUJ:[id number] DOB: [birth year] DOA: 10/09/2023 DOS: the patient was seen and examined on 10/10/2023 PCP: Sharee Holster, NP  Patient coming from: SNF  Chief Complaint: No chief complaint on file.  HPI: Tasha Moss is a 87 y.o. female with medical history significant for hypothyroidism, hypertension, GERD, hyperlipidemia and PEG tube who was sent in from the nursing home after she slid out of her wheelchair and hit the ground.  Initial workup at the emergency department revealed a left hip dislocation.  They attempted to put her hip back in socket with propofol in the emergency department but her O2 sats dropped fairly dramatically.  After that orthopedics recommended intraoperative fixation of the hip.  The patient was transferred here to make that happen in the a.m. The patient was discharged from the hospital 6 weks ago after being treated for pneumonia.  She was previously living at home but after that hospitalization she was not able to walk with the walker anymore and has been in a nursing facility since that time.  The patient's primary caregiver is her husband but she has 2 daughters who are very active in her care and 1 daughter is a Engineer, civil (consulting) here at [employer]. She did have a repeat chest x-ray today which revealed chronic interstitial markings without evidence of focal infiltrate.   Review of Systems: unable to review all systems due to the inability of the patient to answer questions. Past Medical History:  Diagnosis Date   Allergy    seasonal & dust   Anxiety    Arthritis    DJD   Cellulitis    Family history of adverse reaction to anesthesia    n/v    GERD (gastroesophageal reflux disease)    History of vertigo    Hyperlipidemia    Hypertension    Hypothyroidism    PONV (postoperative nausea and vomiting)    Thyroid disease    hypothyroidism post Hashimoto's & partial thyroidectomy   Past Surgical History:  Procedure  Laterality Date   BIOPSY  01/11/2022   Procedure: BIOPSY;  Surgeon: Meridee Score Netty Starring., MD;  Location: [facility];  Service: Gastroenterology;;   CATARACT EXTRACTION W/ INTRAOCULAR LENS  IMPLANT, BILATERAL     COLONOSCOPY     diverticulosis   ESOPHAGOGASTRODUODENOSCOPY (EGD) WITH PROPOFOL N/A 01/11/2022   Procedure: ESOPHAGOGASTRODUODENOSCOPY (EGD) WITH PROPOFOL;  Surgeon: Lemar Lofty., MD;  Location: [facility];  Service: Gastroenterology;  Laterality: N/A;   GASTROSTOMY N/A 01/17/2022   Procedure: OPEN GASTROSTOMY PLACEMENT;  Surgeon: Harriette Bouillon, MD;  Location: [facility];  Service: General;  Laterality: N/A;   IR GASTROSTOMY TUBE MOD SED  01/15/2022   IR REPLACE G-TUBE SIMPLE WO FLUORO  03/13/2023   IR REPLACE G-TUBE SIMPLE WO FLUORO  10/06/2023   IR REPLC GASTRO/COLONIC TUBE PERCUT W/FLUORO  02/20/2023   JOINT REPLACEMENT  1995 & 1999   THR bilaterally    lumbar disc repair  1989   Dr Mercy Riding   SAVORY DILATION N/A 01/11/2022   Procedure: Gaspar Bidding DILATION;  Surgeon: Lemar Lofty., MD;  Location: [facility];  Service: Gastroenterology;  Laterality: N/A;   SPINE SURGERY     cervical ; Dr Venetia Maxon   THYROIDECTOMY, PARTIAL     benign nodule   TONSILLECTOMY AND ADENOIDECTOMY     TOTAL SHOULDER ARTHROPLASTY Left 02/27/2016   Procedure: LEFT REVERSE TOTAL SHOULDER ARTHROPLASTY;  Surgeon: Sheral Apley, MD;  Location: [facility];  Service:  Orthopedics;  Laterality: Left;   vitreous detachment     Dr Nelle Don   Social History:  reports that she has never smoked. She has never used smokeless tobacco. She reports that she does not drink alcohol and does not use drugs.  Allergies  Allergen Reactions   Gabapentin Nausea And Vomiting    hallucinations   Other Nausea And Vomiting    Strong pain medications   Morphine Nausea And Vomiting    Family History  Problem Relation Age of Onset   Diabetes Mother    Hepatitis Mother        Hepatitis B,? etiology    Miscarriages / Stillbirths Mother    Stroke Mother 54   Heart disease Father        CHF   Cancer Paternal Aunt        GI cancers   Heart disease Paternal Aunt        CHF   Cancer Paternal Uncle        lung, GI   Heart disease Paternal Uncle        CHF   Colon cancer Neg Hx    Esophageal cancer Neg Hx    Pancreatic cancer Neg Hx    Rectal cancer Neg Hx    Stomach cancer Neg Hx     Prior to Admission medications   Medication Sig Start Date End Date Taking? Authorizing Provider  acetaminophen (TYLENOL) 500 MG tablet Place 500 mg into feeding tube every 6 (six) hours as needed for moderate pain (pain score 4-6).   Yes [provider]  albuterol (VENTOLIN HFA) 108 (90 Base) MCG/ACT inhaler Inhale 2 puffs into the lungs every 6 (six) hours as needed for wheezing or shortness of breath. 07/04/23  Yes Sheliah Hatch, MD  calcium carbonate (TUMS - DOSED IN MG ELEMENTAL CALCIUM) 500 MG chewable tablet Chew 1 tablet by mouth daily.   Yes [provider]  FLUoxetine (PROZAC) 20 MG capsule TAKE 1 CAPSULE BY MOUTH DAILY. 09/02/23  Yes Sheliah Hatch, MD  fluticasone (FLONASE) 50 MCG/ACT nasal spray Place 1 spray into both nostrils as needed for allergies or rhinitis.   Yes [provider]  furosemide (LASIX) 10 MG/ML solution Take 20 mg by mouth daily.   Yes [provider]  levothyroxine (SYNTHROID) 112 MCG tablet TAKE ONE TABLET BY MOUTH ONCE DAILY. 08/01/23  Yes Sheliah Hatch, MD  loratadine (CLARITIN) 10 MG tablet Take 10 mg by mouth daily.   Yes [provider]  meclizine (ANTIVERT) 25 MG tablet Take 25 mg by mouth 3 (three) times daily as needed for dizziness.   Yes [provider]  metoCLOPramide (REGLAN) 5 MG tablet Take 1 tablet (5 mg total) by mouth in the morning and at bedtime. 11/05/22  Yes Cirigliano, Vito V, DO  midodrine (PROAMATINE) 2.5 MG tablet Take 2.5 mg by mouth 3 (three) times daily with meals.   Yes [provider]  Nutritional Supplements (FEEDING SUPPLEMENT, JEVITY 1.5 CAL/FIBER,) LIQD Place 65 mL/hr into feeding tube continuous. From 4 PM to 10 AM   Yes [provider]  nystatin (MYCOSTATIN/NYSTOP) powder Apply 1 Application topically 3 (three) times daily. 07/04/23  Yes Sheliah Hatch, MD  omeprazole (PRILOSEC) 40 MG capsule Take 40 mg by mouth 2 (two) times daily.   Yes [provider]  ondansetron (ZOFRAN-ODT) 8 MG disintegrating tablet Take 8 mg by mouth every 6 (six) hours as needed for nausea or vomiting. Also give  prior to therapy   Yes [provider]  Polyvinyl Alcohol-Povidone (ARTIFICIAL TEARS) 5-6 MG/ML SOLN Place 1 drop into both eyes daily.   Yes [provider]  potassium chloride 20 MEQ/15ML (10%) SOLN Take 20 mEq by mouth 2 (two) times daily. 09/29/23  Yes [provider]  Protein (FEEDING SUPPLEMENT, PROSOURCE TF20,) liquid Place 60 mLs into feeding tube 2 (two) times daily.   Yes [provider]  ipratropium-albuterol (DUONEB) 0.5-2.5 (3) MG/3ML SOLN Take 3 mLs by nebulization every 6 (six) hours.  09/25/23  [provider]  Water For Irrigation, Sterile (STERILE WATER FOR IRRIGATION) Irrigate with 100 mLs as directed every 4 (four) hours. Free water    [provider]    Physical Exam: Vitals:   10/09/23 1830 10/09/23 1845 10/09/23 2110 10/09/23 2149  BP: 126/62 126/64  119/62  Pulse: 92 91  78  Resp: (!) 25 (!) 23  19  Temp:   98.1 F (36.7 C)   TempSrc:   Oral   SpO2: 95% 95%  95%  Weight:      Height:       Physical Exam:  General: No acute distress, frail HEENT: Normocephalic, atraumatic, PERRL Cardiovascular: Normal rate and rhythm. Distal pulses intact. Pulmonary: Normal pulmonary effort, normal breath sounds Gastrointestinal: Nondistended abdomen, soft, non-tender, normoactive bowel sounds Musculoskeletal:no lower ext edema, ROM deferred Lymphadenopathy: No cervical  LAD. Skin: Skin is warm and dry. Neuro: Strength testing deferred due to known dislocation, pleasant, Hard of hearing PSYCH: Attentive and cooperative  Data Reviewed:  Results for orders placed or performed during the hospital encounter of 10/09/23 (from the past 24 hour(s))  CBC with Differential     Status: Abnormal   Collection Time: 10/09/23  3:22 PM  Result Value Ref Range   WBC 6.1 4.0 - 10.5 K/uL   RBC 3.73 (L) 3.87 - 5.11 MIL/uL   Hemoglobin 11.5 (L) 12.0 - 15.0 g/dL   HCT 02.7 25.3 - 66.4 %   MCV 102.4 (H) 80.0 - 100.0 fL   MCH 30.8 26.0 - 34.0 pg   MCHC 30.1 30.0 - 36.0 g/dL   RDW 40.3 47.4 - 25.9 %   Platelets 209 150 - 400 K/uL   nRBC 0.0 0.0 - 0.2 %   Neutrophils Relative % 80 %   Neutro Abs 4.8 1.7 - 7.7 K/uL   Lymphocytes Relative 10 %   Lymphs Abs 0.6 (L) 0.7 - 4.0 K/uL   Monocytes Relative 8 %   Monocytes Absolute 0.5 0.1 - 1.0 K/uL   Eosinophils Relative 1 %   Eosinophils Absolute 0.1 0.0 - 0.5 K/uL   Basophils Relative 0 %   Basophils Absolute 0.0 0.0 - 0.1 K/uL   Immature Granulocytes 1 %   Abs Immature Granulocytes 0.06 0.00 - 0.07 K/uL  Basic metabolic panel     Status: Abnormal   Collection Time: 10/09/23  3:22 PM  Result Value Ref Range   Sodium 145 135 - 145 mmol/L   Potassium 4.3 3.5 - 5.1 mmol/L   Chloride 100 98 - 111 mmol/L   CO2 35 (H) 22 - 32 mmol/L   Glucose, Bld 174 (H) 70 - 99 mg/dL   BUN 31 (H) 8 - 23 mg/dL   Creatinine, Ser 5.63 0.44 - 1.00 mg/dL   Calcium 8.4 (L) 8.9 - 10.3 mg/dL   GFR, Estimated >87 >56 mL/min   Anion gap 10 5 - 15     Assessment and Plan: Left hip  dislocation - n.p.o.  Orthopedic surgery plans for operative solution in a.m.  2. F/E/N -resume Jevity tube feeds as soon as possible.  The patient is hungry. She does take ice chips by mouth.  Everything else goes through her PEG tube.  3.  Hypothyroidism -continue Synthroid   Advance Care Planning:   Code Status: Limited: Do not attempt resuscitation (DNR)  -DNR-LIMITED -Do Not Intubate/DNI per the POST in chart  Consults: Ortho  Family Communication: 2 daughters at bedside.  One of the patient's daughters is a Engineer, civil (consulting) here.  Severity of Illness: The appropriate patient status for this patient is INPATIENT. Inpatient status is judged to be reasonable and necessary in order to provide the required intensity of service to ensure the patient's safety. The patient's presenting symptoms, physical exam findings, and initial radiographic and laboratory data in the context of their chronic comorbidities is felt to place them at high risk for further clinical deterioration. Furthermore, it is not anticipated that the patient will be medically stable for discharge from the hospital within 2 midnights of admission.   * I certify that at the point of admission it is my clinical judgment that the patient will require inpatient hospital care spanning beyond 2 midnights from the point of admission due to high intensity of service, high risk for further deterioration and high frequency of surveillance required.*  Author: Buena Irish, MD 10/10/2023 12:50 AM  For on call review [email or web address].

## 2023-10-10 NOTE — Consult Note (Signed)
Consultation Note Date: 10/10/2023   Patient Name: Tasha Moss  DOB: 1936-06-20  MRN: [id number]  Age / Sex: 87 y.o., female  PCP: Sharee Holster, NP Referring Physician: Azucena Fallen, MD  Reason for Consultation: Establishing goals of care  HPI/Patient Profile: 87 y.o. female  with past medical history of dementia, hypothyroidism, hypertension, GERD, hyperlipidemia, diastolic CHF and PEG tube  admitted on 10/09/2023 with fall from wheelchair at [facility].   Initial workup at the emergency department revealed a left hip dislocation.  They attempted to put her hip back in socket with propofol in the emergency department but her O2 sats dropped fairly dramatically.  After that orthopedics recommended intraoperative fixation of the hip.    The patient was discharged from the hospital 6 weeks ago after being treated for pneumonia.   PMT has been consulted to assist with goals of care conversation.  Clinical Assessment and Goals of Care:  I have reviewed medical records including EPIC notes, labs and imaging, discussed with MD, assessed the patient and then had a phone conversation with patient's husband and daughter [name] to discuss diagnosis prognosis, GOC, EOL wishes, disposition and options.  I introduced Palliative Medicine as specialized medical care for people living with serious illness. It focuses on providing relief from the symptoms and stress of a serious illness. The goal is to improve quality of life for both the patient and the family.  We discussed a brief life review of the patient and then focused on their current illness.  The natural disease trajectory and expectations at EOL were discussed.  I attempted to elicit values and goals of care important to the patient.    Medical History Review and Understanding:  We discussed patient's acute illness in the context of her chronic comorbidities.   Family have a good understanding the severity of her illness and poor long-term prognosis.  Social History: Patient and her husband have been married for 67 years.  They share 2 daughters together, having lost 1 daughter early in their marriage.  Patient is very hard of hearing and having difficulty with vision loss.  Functional and Nutritional State: Patient is bedbound and wheelchair-bound.  She is incontinent.  She is nearly nonverbal and speech is very unintelligible when attempted.  She spends most of her days sleeping.  She has poor oral intake.  Albumin of 2.3 noted.  Advance Directives: A detailed discussion regarding advanced directives was had. Reviewed MOST form on while which indicated preference for comfort-focused care. Reviewed advance directive/hcpoa documentation. Husband is primary HCPOA and he defers to Holly/daughter, secondary HCPOA. Living will indicates patient grants discretion of life-prolonging treatment to HCPOA(s).   Code Status: Concepts specific to code status, artifical feeding and hydration, and rehospitalization were considered and discussed. DNR confirmed.  Discussion: Patient's husband tells me that his priority at this time is to ensure patient does not suffer.  He defers a lot of the decisions and medical discussions to his daughter, who is a Engineer, civil (consulting) at [employer].  When I spoke to daughter/RN [place], she is certainly interested in determining whether patient is eligible for hospice.  Upon detailed review of the eligibility criteria with patient's daughter, it was determined that patient is a strong candidate.  They will likely move forward with a hospice referral in their home county of [place], however they would like to discuss as a family first and also make sure that the pending Medicaid application process will not be affected.  [name] shares that  her sister Cala Bradford has worked very hard on this. I reviewed insurance coverage of hospice to the best of my  ability and offered to reach out to [facility] for further written resources as requested.  She is very Adult nurse.  Provided with written resources on palliative care versus hospice care as well.  [name] explains that patient is already at the long-term care facility [facility] nursing.  She is open to continue discussions with hospice as well, based on what location for hospice services is ideal moving forward.  I also briefly explained that comfort focused care in the hospital setting is also an option while arranging for next episode.   The difference between aggressive medical intervention and comfort care was considered in light of the patient's goals of care. Hospice and Palliative Care services outpatient were explained and offered.   Discussed the importance of continued conversation with family and the medical providers regarding overall plan of care and treatment options, ensuring decisions are within the context of the patient's values and GOCs.   Questions and concerns were addressed.  Hard Choices booklet left for review. The family was encouraged to call with questions or concerns.  PMT will continue to support holistically.   SUMMARY OF RECOMMENDATIONS   -Continue DNR -Continue current care -Family leaning towards hospice upon discharge -TOC notified of family interest in written resources regarding insurance coverage of hospice and further conversation with hospice of [place], assistance is appreciated -Ongoing goals of care discussions -PMT will continue to follow and support  Prognosis:  Hospice appropriate if aligned with GOC  Discharge Planning: To Be Determined      Primary Diagnoses: Present on Admission:  Hip dislocation, left [facility])   Physical Exam Vitals and nursing note reviewed.   Unable to complete PE due to patient being in OR.  Vital Signs: BP (!) 102/58   Pulse 87   Temp 98.5 F (36.9 C) (Axillary)   Resp 17   Ht 5\' 5"  (1.651 m)   Wt 65.7 kg   SpO2 97%    BMI 24.09 kg/m  Pain Scale: 0-10   Pain Score: 7    SpO2: SpO2: 97 % O2 Device:SpO2: 97 % O2 Flow Rate: .O2 Flow Rate (L/min): 3 L/min   Palliative Assessment/Data: 20%    MDM: high   Khalani Novoa Jeni Salles, PA-C  Palliative Medicine Team Team phone # [phone]  Thank you for allowing the Palliative Medicine Team to assist in the care of this patient. Please utilize secure chat with additional questions, if there is no response within 30 minutes please call the above phone number.  Palliative Medicine Team providers are available by phone from 7am to 7pm daily and can be reached through the team cell phone.  Should this patient require assistance outside of these hours, please call the patient's attending physician.

## 2023-10-11 ENCOUNTER — Encounter (HOSPITAL_COMMUNITY): Payer: Self-pay | Admitting: Student

## 2023-10-11 DIAGNOSIS — Z66 Do not resuscitate: Secondary | ICD-10-CM | POA: Diagnosis not present

## 2023-10-11 DIAGNOSIS — S73005A Unspecified dislocation of left hip, initial encounter: Secondary | ICD-10-CM | POA: Diagnosis not present

## 2023-10-11 DIAGNOSIS — R1111 Vomiting without nausea: Secondary | ICD-10-CM | POA: Diagnosis not present

## 2023-10-11 DIAGNOSIS — S73005D Unspecified dislocation of left hip, subsequent encounter: Secondary | ICD-10-CM | POA: Diagnosis not present

## 2023-10-11 DIAGNOSIS — Z515 Encounter for palliative care: Secondary | ICD-10-CM | POA: Diagnosis not present

## 2023-10-11 DIAGNOSIS — Z7189 Other specified counseling: Secondary | ICD-10-CM | POA: Diagnosis not present

## 2023-10-11 DIAGNOSIS — Z7401 Bed confinement status: Secondary | ICD-10-CM | POA: Diagnosis not present

## 2023-10-11 DIAGNOSIS — R531 Weakness: Secondary | ICD-10-CM | POA: Diagnosis not present

## 2023-10-11 LAB — BASIC METABOLIC PANEL
Anion gap: 8 (ref 5–15)
BUN: 29 mg/dL — ABNORMAL HIGH (ref 8–23)
CO2: 32 mmol/L (ref 22–32)
Calcium: 8 mg/dL — ABNORMAL LOW (ref 8.9–10.3)
Chloride: 103 mmol/L (ref 98–111)
Creatinine, Ser: 0.52 mg/dL (ref 0.44–1.00)
GFR, Estimated: >60 mL/min (ref >60)
Glucose, Bld: 206 mg/dL — ABNORMAL HIGH (ref 70–99)
Potassium: 4.1 mmol/L (ref 3.5–5.1)
Sodium: 143 mmol/L (ref 135–145)

## 2023-10-11 LAB — CBC
HCT: 30.6 % — ABNORMAL LOW (ref 36.0–46.0)
Hemoglobin: 9.1 g/dL — ABNORMAL LOW (ref 12.0–15.0)
MCH: 30.3 pg (ref 26.0–34.0)
MCHC: 29.7 g/dL — ABNORMAL LOW (ref 30.0–36.0)
MCV: 102 fL — ABNORMAL HIGH (ref 80.0–100.0)
Platelets: 163 10*3/uL (ref 150–400)
RBC: 3 MIL/uL — ABNORMAL LOW (ref 3.87–5.11)
RDW: 14.9 % (ref 11.5–15.5)
WBC: 6.7 10*3/uL (ref 4.0–10.5)
nRBC: 0 % (ref 0.0–0.2)

## 2023-10-11 MED ORDER — OSMOLITE 1.2 CAL PO LIQD: 1000.0000 mL | ORAL | Status: DC

## 2023-10-11 MED ORDER — ASPIRIN 81 MG PO TBEC: 81.0000 mg | DELAYED_RELEASE_TABLET | Freq: Every day | ORAL | 12 refills | Status: DC

## 2023-10-11 MED ORDER — PROSOURCE TF20 ENFIT COMPATIBL EN LIQD
60.0000 mL | Freq: Every day | ENTERAL | Status: DC
  Administered 2023-10-11: 60 mL
  Filled 2023-10-11: qty 60

## 2023-10-11 MED ORDER — JEVITY 1.2 CAL PO LIQD
1000.0000 mL | ORAL | Status: DC
  Filled 2023-10-11: qty 1000

## 2023-10-11 MED ORDER — JUVEN PO PACK
1.0000 | PACK | Freq: Two times a day (BID) | ORAL | Status: DC
  Administered 2023-10-11: 1
  Filled 2023-10-11: qty 1

## 2023-10-11 NOTE — Care Management Obs Status (Signed)
MEDICARE OBSERVATION STATUS NOTIFICATION   Patient Details  Name: CAITRIONA SUNDQUIST MRN: [id number] Date of Birth: 20-Mar-1936   Medicare Observation Status Notification Given:  Yes    Ronny Bacon, RN 10/11/2023, 2:00 PM

## 2023-10-11 NOTE — Plan of Care (Signed)

## 2023-10-11 NOTE — Discharge Summary (Signed)
Physician Discharge Summary  Tasha Moss BJY:[id number] DOB: 1936/01/18 DOA: 10/09/2023  PCP: Sharee Holster, NP  Admit date: 10/09/2023 Discharge date: 10/11/2023  Admitted From: Facility Disposition: Same  Recommendations for Outpatient Follow-up:  Follow up with PCP in 1-2 weeks Follow-up with orthopedic surgery in the next 1 to 2 weeks as scheduled Follow-up with palliative care as discussed:  Discharge Condition: Stable CODE STATUS: DNR Diet recommendation: As tolerated  Brief/Interim Summary: Tasha Moss is a 87 y.o. female with medical history significant for hypothyroidism, hypotension, GERD, hyperlipidemia and PEG tube who was sent in from the nursing home after she slid out of her wheelchair and hit the ground with notable L hip dislocation. This is her second dislocation (last 2 years ago) and has been gradually declining since that time. More recently she's become essentially bed/wheelchair bound due to ongoing weakness/instability.  Patient admitted as above with what appears to be mechanical slip and fall from wheelchair with left hip dislocation, this is at least the second occurrence of this issue.  Orthopedic surgery consulted, status post closed reduction on 10/10/2023.  Patient tolerated quite well and is otherwise stable for discharge back to facility.  Given patient's age and comorbidities palliative care was consulted, hospice will follow along outpatient, she does not yet qualify for hospice house however given her comorbidities she does remain high risk for decompensation at this time.   Discharge Diagnoses:  Principal Problem:   Hip dislocation, left (HCC)  Left hip dislocation, recurrent Close reduction successful, no complications, outpatient follow-up with orthopedic surgery   Chronic enteral feeding via G tube Malnutrition, chronic Functionally NPO Resume tube feeds at prior rates   Chronic diastolic heart failue - not in acute exacerbation.   Continue home medications  Hypothyroidism -continue Synthroid Hypotension/HLD- Continue home regimen - midodrine; statin Unspecified anxiety/depression - Complicated by dementia - resume home fluoxetine  Discharge Instructions   Allergies as of 10/11/2023       Reactions   Gabapentin Nausea And Vomiting   hallucinations   Other Nausea And Vomiting   Strong pain medications   Morphine Nausea And Vomiting        Medication List     TAKE these medications    acetaminophen 500 MG tablet Commonly known as: TYLENOL Place 500 mg into feeding tube every 6 (six) hours as needed for moderate pain (pain score 4-6).   albuterol 108 (90 Base) MCG/ACT inhaler Commonly known as: VENTOLIN HFA Inhale 2 puffs into the lungs every 6 (six) hours as needed for wheezing or shortness of breath.   Artificial Tears 0.5-0.6 % Soln Generic drug: Polyvinyl Alcohol-Povidone Place 1 drop into both eyes daily.   aspirin EC 81 MG tablet Take 1 tablet (81 mg total) by mouth daily. Swallow whole. Start taking on: October 12, 2023   calcium carbonate 500 MG chewable tablet Commonly known as: TUMS - dosed in mg elemental calcium Chew 1 tablet by mouth daily.   chlorhexidine 4 % external liquid Commonly known as: HIBICLENS Apply 15 mLs (1 Application total) topically as directed for 30 doses. Use as directed daily for 5 days every other week for 6 weeks.   feeding supplement (JEVITY 1.5 CAL/FIBER) Liqd Place 65 mL/hr into feeding tube continuous. From 4 PM to 10 AM   feeding supplement (PROSource TF20) liquid Place 60 mLs into feeding tube 2 (two) times daily.   FLUoxetine 20 MG capsule Commonly known as: PROZAC TAKE 1 CAPSULE BY MOUTH DAILY.   fluticasone  50 MCG/ACT nasal spray Commonly known as: FLONASE Place 1 spray into both nostrils as needed for allergies or rhinitis.   furosemide 10 MG/ML solution Commonly known as: LASIX Take 20 mg by mouth daily.   ipratropium-albuterol 0.5-2.5  (3) MG/3ML Soln Commonly known as: DUONEB Take 3 mLs by nebulization every 6 (six) hours.   levothyroxine 112 MCG tablet Commonly known as: SYNTHROID TAKE ONE TABLET BY MOUTH ONCE DAILY.   loratadine 10 MG tablet Commonly known as: CLARITIN Take 10 mg by mouth daily.   meclizine 25 MG tablet Commonly known as: ANTIVERT Take 25 mg by mouth 3 (three) times daily as needed for dizziness.   metoCLOPramide 5 MG tablet Commonly known as: Reglan Take 1 tablet (5 mg total) by mouth in the morning and at bedtime.   midodrine 2.5 MG tablet Commonly known as: PROAMATINE Take 2.5 mg by mouth 3 (three) times daily with meals.   mupirocin ointment 2 % Commonly known as: BACTROBAN Place 1 Application into the nose 2 (two) times daily for 60 doses. Use as directed 2 times daily for 5 days every other week for 6 weeks.   nystatin powder Commonly known as: MYCOSTATIN/NYSTOP Apply 1 Application topically 3 (three) times daily.   omeprazole 40 MG capsule Commonly known as: PRILOSEC Take 40 mg by mouth 2 (two) times daily.   ondansetron 8 MG disintegrating tablet Commonly known as: ZOFRAN-ODT Take 8 mg by mouth every 6 (six) hours as needed for nausea or vomiting. Also give prior to therapy   potassium chloride 20 MEQ/15ML (10%) Soln Take 20 mEq by mouth 2 (two) times daily.   sterile water for irrigation Irrigate with 100 mLs as directed every 4 (four) hours. Free water        Allergies  Allergen Reactions   Gabapentin Nausea And Vomiting    hallucinations   Other Nausea And Vomiting    Strong pain medications   Morphine Nausea And Vomiting    Consultations: Orthopedic surgery  Procedures/Studies: DG HIP UNILAT WITH PELVIS 2-3 VIEWS LEFT  Result Date: 10/10/2023 CLINICAL DATA:  Closed reduction of left hip arthroplasty. EXAM: DG HIP (WITH OR WITHOUT PELVIS) 2-3V LEFT COMPARISON:  Pelvis and left hip radiographs 10/09/2023 FINDINGS: Images were performed intraoperatively  without the presence of a radiologist. The patient is undergoing interval relocation of left hip arthroplasty. On the final image there is normal frontal alignment of the left acetabular cup and left femoral head prosthesis. Total fluoroscopy images: 3 Total fluoroscopy time: 22 seconds Total dose: Radiation Exposure Index (as provided by the fluoroscopic device): 3.4 mGy air Kerma Please see intraoperative findings for further detail. IMPRESSION: Intraoperative fluoroscopic guidance for closed reduction of left hip arthroplasty. Electronically Signed   By: Neita Garnet M.D.   On: 10/10/2023 17:27   DG HIP PORT UNILAT W OR W/O PELVIS 1V LEFT  Result Date: 10/10/2023 CLINICAL DATA:  Fracture. Left hip arthroplasty dislocation. Postoperative. EXAM: DG HIP (WITH OR WITHOUT PELVIS) 1V PORT LEFT COMPARISON:  Pelvis and left hip radiographs 10/09/2023 FINDINGS: Interval relocation of total left hip arthroplasty hardware. The right total hip arthroplasty hardware appears normally aligned on frontal view. Moderate bilateral sacroiliac subchondral sclerosis. Mild inferior pubic symphysis joint space narrowing. No acute fracture is seen. IMPRESSION: Interval relocation of total left hip arthroplasty hardware. Electronically Signed   By: Neita Garnet M.D.   On: 10/10/2023 17:26   DG C-Arm 1-60 Min-No Report  Result Date: 10/10/2023 Fluoroscopy was utilized by the requesting  physician.  No radiographic interpretation.   DG Chest Port 1 View  Result Date: 10/09/2023 CLINICAL DATA:  Hypoxia. EXAM: PORTABLE CHEST 1 VIEW COMPARISON:  August 21, 2023 FINDINGS: The heart size and mediastinal contours are within normal limits. Small surgical clips are seen along the superior mediastinum on the right. There is stable moderate severity elevation of the right hemidiaphragm with subsequent right-sided volume loss. A very small left pleural effusion is noted. Mild, chronic appearing increased interstitial lung markings  are seen without evidence of focal consolidation or pneumothorax. A radiopaque fusion plate and screws are seen overlying the lower cervical spine. A left shoulder replacement is noted. Multilevel degenerative changes are present throughout the thoracic spine. IMPRESSION: 1. Very small left pleural effusion. 2. Stable moderate severity elevation of the right hemidiaphragm with subsequent right-sided volume loss. 3. Mild, chronic appearing increased interstitial lung markings. Electronically Signed   By: Aram Candela M.D.   On: 10/09/2023 19:48   DG Pelvis 1-2 Views  Result Date: 10/09/2023 CLINICAL DATA:  Status post attempted reduction. EXAM: PELVIS - 1-2 VIEW COMPARISON:  Same day. FINDINGS: There appears to be superior dislocation of the left total hip arthroplasty. No definite fracture is noted. IMPRESSION: Superior dislocation of left total hip arthroplasty. Electronically Signed   By: Lupita Raider M.D.   On: 10/09/2023 17:30   DG Hip Unilat With Pelvis 2-3 Views Left  Result Date: 10/09/2023 CLINICAL DATA:  Fall. EXAM: DG HIP (WITH OR WITHOUT PELVIS) 2-3V LEFT COMPARISON:  November 05, 2021. FINDINGS: Status post bilateral total hip arthroplasties. There is noted posterior dislocation of the left femoral component. No definite fracture is noted. IMPRESSION: Posterior dislocation of left total hip arthroplasty. Electronically Signed   By: Lupita Raider M.D.   On: 10/09/2023 17:28   IR REPLACE G-TUBE SIMPLE WO FLUORO  Result Date: 10/06/2023 INDICATION: Six-month routine replacement needed balloon retention gastrostomy tube. EXAM: GASTROSTOMY TUBE REPLACEMENT WITHOUT FLUOROSCOPY MEDICATIONS: None ANESTHESIA/SEDATION: None CONTRAST:  None FLUOROSCOPY: None COMPLICATIONS: None immediate. PROCEDURE: Informed written consent was obtained from the patient after a thorough discussion of the procedural risks, benefits and alternatives. All questions were addressed. Maximal Sterile Barrier  Technique was utilized including caps, mask, sterile gowns, sterile gloves, sterile drape, hand hygiene and skin antiseptic. A timeout was performed prior to the initiation of the procedure. The pre-existing gastrostomy tube was removed and a 24 [place], balloon retention low-profile gastrostomy tube with 1.5 cm stoma length was advanced through the tract and into the stomach. The retention balloon was inflated with sterile saline. IMPRESSION: Exchange of 24 French balloon retention low-profile gastrostomy tube without fluoroscopy. Electronically Signed   By: Irish Lack M.D.   On: 10/06/2023 15:03     Subjective: No acute issues or events overnight   Discharge Exam: Vitals:   10/11/23 0546 10/11/23 0743  BP: (!) 110/54 (!) 113/59  Pulse: 80 81  Resp: 15 16  Temp: 98.1 F (36.7 C) 98.5 F (36.9 C)  SpO2: 98% 96%   Vitals:   10/10/23 1615 10/10/23 2100 10/11/23 0546 10/11/23 0743  BP: (!) 110/55 109/61 (!) 110/54 (!) 113/59  Pulse: 76 75 80 81  Resp: 16 17 15 16   Temp: 98.2 F (36.8 C) 98.1 F (36.7 C) 98.1 F (36.7 C) 98.5 F (36.9 C)  TempSrc: Oral Oral Oral Oral  SpO2: 98% 98% 98% 96%  Weight:      Height:       General:  Pleasantly resting in bed,  No acute distress. HEENT:  Normocephalic atraumatic.  Sclerae nonicteric, noninjected.  Extraocular movements intact bilaterally. Neck:  Without mass or deformity.  Trachea is midline. Lungs:  Clear to auscultate bilaterally without rhonchi, wheeze, or rales. Heart:  Regular rate and rhythm.  Without murmurs, rubs, or gallops. Abdomen:  Soft, nontender, nondistended.  PEG tube site clean dry intact Extremities: Without cyanosis, clubbing, edema Skin:  Warm and dry, no erythema.  The results of significant diagnostics from this hospitalization (including imaging, microbiology, ancillary and laboratory) are listed below for reference.     Microbiology: Recent Results (from the past 240 hour(s))  Surgical pcr screen      Status: Abnormal   Collection Time: 10/10/23  1:06 AM   Specimen: Nasal Mucosa; Nasal Swab  Result Value Ref Range Status   MRSA, PCR POSITIVE (A) NEGATIVE Final   Staphylococcus aureus POSITIVE (A) NEGATIVE Final    Comment: RESULTS CALLED TO, READ BACK BY AND VERIFIED WITH RN Q.WINSTON ON 10/10/23 AT 0258 BY NM (NOTE) The Xpert SA Assay (FDA approved for NASAL specimens in patients 87 years of age and older), is one component of a comprehensive surveillance program. It is not intended to diagnose infection nor to guide or monitor treatment. Performed at [facility], [address], [place], [place] [address]      Labs: BNP (last 3 results) Recent Labs    09/01/23 0800 09/09/23 1518 09/15/23 0500  BNP 58.0 75.0 45.0   Basic Metabolic Panel: Recent Labs  Lab 10/09/23 1522 10/11/23 0351  NA 145 143  K 4.3 4.1  CL 100 103  CO2 35* 32  GLUCOSE 174* 206*  BUN 31* 29*  CREATININE 0.53 0.52  CALCIUM 8.4* 8.0*   Liver Function Tests: No results for input(s): "AST", "ALT", "ALKPHOS", "BILITOT", "PROT", "ALBUMIN" in the last 168 hours. No results for input(s): "LIPASE", "AMYLASE" in the last 168 hours. No results for input(s): "AMMONIA" in the last 168 hours. CBC: Recent Labs  Lab 10/09/23 1522 10/11/23 0351  WBC 6.1 6.7  NEUTROABS 4.8  --   HGB 11.5* 9.1*  HCT 38.2 30.6*  MCV 102.4* 102.0*  PLT 209 163   Cardiac Enzymes: No results for input(s): "CKTOTAL", "CKMB", "CKMBINDEX", "TROPONINI" in the last 168 hours. BNP: Invalid input(s): "POCBNP" CBG: No results for input(s): "GLUCAP" in the last 168 hours. D-Dimer No results for input(s): "DDIMER" in the last 72 hours. Hgb A1c No results for input(s): "HGBA1C" in the last 72 hours. Lipid Profile No results for input(s): "CHOL", "HDL", "LDLCALC", "TRIG", "CHOLHDL", "LDLDIRECT" in the last 72 hours. Thyroid function studies No results for input(s): "TSH", "T4TOTAL", "T3FREE", "THYROIDAB" in the last 72  hours.  Invalid input(s): "FREET3" Anemia work up Recent Labs    10/10/23 0733  VITAMINB12 772  FOLATE 24.1   Urinalysis    Component Value Date/Time   COLORURINE YELLOW 09/02/2022 2109   APPEARANCEUR CLEAR 09/02/2022 2109   LABSPEC 1.013 09/02/2022 2109   PHURINE 8.0 09/02/2022 2109   GLUCOSEU NEGATIVE 09/02/2022 2109   HGBUR NEGATIVE 09/02/2022 2109   BILIRUBINUR NEGATIVE 09/02/2022 2109   BILIRUBINUR negative 12/20/2019 1413   KETONESUR NEGATIVE 09/02/2022 2109   PROTEINUR NEGATIVE 09/02/2022 2109   UROBILINOGEN 0.2 12/20/2019 1413   UROBILINOGEN 0.2 07/16/2010 0038   NITRITE NEGATIVE 09/02/2022 2109   LEUKOCYTESUR NEGATIVE 09/02/2022 2109   Sepsis Labs Recent Labs  Lab 10/09/23 1522 10/11/23 0351  WBC 6.1 6.7   Microbiology Recent Results (from the past 240  hour(s))  Surgical pcr screen     Status: Abnormal   Collection Time: 10/10/23  1:06 AM   Specimen: Nasal Mucosa; Nasal Swab  Result Value Ref Range Status   MRSA, PCR POSITIVE (A) NEGATIVE Final   Staphylococcus aureus POSITIVE (A) NEGATIVE Final    Comment: RESULTS CALLED TO, READ BACK BY AND VERIFIED WITH RN Q.WINSTON ON 10/10/23 AT 0258 BY NM (NOTE) The Xpert SA Assay (FDA approved for NASAL specimens in patients 11 years of age and older), is one component of a comprehensive surveillance program. It is not intended to diagnose infection nor to guide or monitor treatment. Performed at [facility], [address], [place], [place] [address]      Time coordinating discharge: Over 30 minutes  SIGNED:   Azucena Fallen, DO [facility] 10/11/2023, 1:30 PM Pager   If 7PM-7AM, please contact night-coverage [email or web address]

## 2023-10-11 NOTE — Progress Notes (Addendum)
Daily Progress Note   Patient Name: Tasha Moss       Date: 10/11/2023 DOB: 24-Dec-1935  Age: 87 y.o. MRN#: [id number] Attending Physician: Azucena Fallen, MD Primary Care Physician: Sharee Holster, NP Admit Date: 10/09/2023  Reason for Consultation/Follow-up: Establishing goals of care  Subjective: Medical records reviewed including progress notes, labs, and imaging. Patient assessed at the bedside. She is resting with [place] in place, in no distress. Her husband is present visiting. Discussed with RN.  Created space and opportunity for patient's family's thoughts and feelings on her current illness. He was hoping for a medical update and I shared that orthopedic PA has visited today. We discussed PRN medications available for pain and he notes that she typically does not do well with stronger pain medications.   Husband also shares that his daughters have not yet been able to review hospice resources after our conversation yesterday. [name] is actually out of town today for an event in [place]. They will continue conversations over the weekend and are encouraged to reach out to PMT with additional questions or needs.  Questions and concerns addressed. PMT will continue to support holistically.   Length of Stay: 1   Physical Exam Vitals and nursing note reviewed.  Constitutional:      General: She is sleeping. She is not in acute distress.    Appearance: She is ill-appearing.  Cardiovascular:     Rate and Rhythm: Normal rate.  Pulmonary:     Effort: Pulmonary effort is normal. No respiratory distress.  Skin:    General: Skin is warm and dry.            Vital Signs: BP (!) 113/59 (BP Location: Left Arm)   Pulse 81   Temp 98.5 F (36.9 C) (Oral)   Resp 16   Ht 5\' 5"  (1.651 m)    Wt 65.7 kg   SpO2 96%   BMI 24.09 kg/m  SpO2: SpO2: 96 % O2 Device: O2 Device: Nasal Cannula O2 Flow Rate: O2 Flow Rate (L/min): 3 L/min      Palliative Assessment/Data: 30% at best    Palliative Care Assessment & Plan   Patient Profile: 87 y.o. female  with past medical history of dementia, hypothyroidism, hypertension, GERD, hyperlipidemia, diastolic CHF and PEG tube  admitted on 10/09/2023 with fall from wheelchair at [facility].    Initial workup at the emergency department revealed a left hip dislocation.  They attempted to put her hip back in socket with propofol in the emergency department but her O2 sats dropped fairly dramatically.  After that orthopedics recommended intraoperative fixation of the hip.     The patient was discharged from the hospital 6 weeks ago after being treated for pneumonia.    PMT has been consulted to assist with goals of care conversation.  Assessment: Left hip dislocation s/p closed hip reducation Severe dementia PEG dependent Chronic CHF  Recommendations/Plan: Continue DNR/DNI Continue current care plan Family leaning towards hospice upon discharge, still need to continue conversations before final decision is made PMT will continue to follow and support   Addendum: Spoke with patient's daughter [name], reviewed differences between hospice at long-term care facility vs hospice facility.  We discussed that patient is likely not at the expected prognosis for hospice facility placement quite yet, though anything can happen anytime.  Addressed questions regarding insurance coverage of long-term facility to the best of my ability.  She is agreeable with proceeding with hospice referral today and discharge back to facility accordingly.  MD and TOC notified.   Prognosis:  < 6 months  Discharge Planning: To Be Determined  Care plan was discussed with patient's husband, daughter, RN, MD, TOC   Total time: I spent 50 minutes in the care of the  patient today in the above activities and documenting the encounter.          Emmery Seiler Jeni Salles, PA-C  Palliative Medicine Team Team phone # [phone]  Thank you for allowing the Palliative Medicine Team to assist in the care of this patient. Please utilize secure chat with additional questions, if there is no response within 30 minutes please call the above phone number.  Palliative Medicine Team providers are available by phone from 7am to 7pm daily and can be reached through the team cell phone.  Should this patient require assistance outside of these hours, please call the patient's attending physician.

## 2023-10-11 NOTE — TOC Transition Note (Addendum)
Transition of Care [facility]) - CM/SW Discharge Note   Patient Details  Name: Tasha Moss MRN: [id number] Date of Birth: Jun 09, 1936  Transition of [facility] CM/SW Contact:  Verna Czech [place], [place] Phone Number: 10/11/2023, 2:21 PM   Clinical Narrative:     Patient to discharge back to [facility] Patient to discharge with Hospice Care. Referral made to [facility] [phone] with Olegario Messier. They will be able to met with patient on Monday, 10/13/23. Patient to be transported by [facility] and will go to room 145.  RN to call in report to [phone].  2:33pm Return call from [facility]-spoke with Myna Hidalgo who confirmed receiving Hospice Care referral-contact information provided for patient's daughter-they will reach out to patient and her family on 10/13/23.  Vadie Principato, LCSW Transition of Care          Patient Goals and CMS Choice      Discharge Placement                         Discharge Plan and Services Additional resources added to the After Visit Summary for                                       Social Determinants of Health (SDOH) Interventions SDOH Screenings   Food Insecurity: No Food Insecurity (10/10/2023)  Housing: Low Risk  (10/10/2023)  Transportation Needs: No Transportation Needs (10/10/2023)  Utilities: Not At Risk (10/10/2023)  Alcohol Screen: Low Risk  (02/04/2023)  Depression (PHQ2-9): Low Risk  (02/04/2023)  Financial Resource Strain: Low Risk  (02/04/2023)  Physical Activity: Sufficiently Active (02/04/2023)  Social Connections: Socially Integrated (02/04/2023)  Stress: No Stress Concern Present (02/04/2023)  Tobacco Use: Low Risk  (10/10/2023)     Readmission Risk Interventions     No data to display

## 2023-10-11 NOTE — Progress Notes (Signed)
Orthopaedic Trauma Progress Note  SUBJECTIVE: Patient doing okay today.  Resting comfortably in bed.  Notes some soreness in her hip but no areas of significant pain.  Has not required any narcotic pain medication postoperatively.  Has not been up out of bed yet since surgery.  Has been tolerating hip abduction pillow.  No chest pain. No SOB. No nausea/vomiting. No other complaints.  No family at bedside currently  Vitals:   10/10/23 2100 10/11/23 0546  BP: 109/61 (!) 110/54  Pulse: 75 80  Resp: 17 15  Temp: 98.1 F (36.7 C) 98.1 F (36.7 C)  SpO2: 98% 98%    General: Resting comfortably in bed, no acute distress Respiratory: No increased work of breathing.  LLE: Hip abduction pillow in place.  Mildly tender over the posterior lateral hip but otherwise no significant tenderness throughout the thigh or lower leg.  Compartments are soft compressible.  Ankle dorsiflexion/plantarflexion are intact.  Able to wiggle toes.  Neurovascularly intact.  IMAGING: Stable post op imaging.   LABS:  Results for orders placed or performed during the hospital encounter of 10/09/23 (from the past 24 hour(s))  Folate     Status: None   Collection Time: 10/10/23  7:33 AM  Result Value Ref Range   Folate 24.1 >5.9 ng/mL  Vitamin B12     Status: None   Collection Time: 10/10/23  7:33 AM  Result Value Ref Range   Vitamin B-12 772 180 - 914 pg/mL  CBC     Status: Abnormal   Collection Time: 10/11/23  3:51 AM  Result Value Ref Range   WBC 6.7 4.0 - 10.5 K/uL   RBC 3.00 (L) 3.87 - 5.11 MIL/uL   Hemoglobin 9.1 (L) 12.0 - 15.0 g/dL   HCT 16.1 (L) 09.6 - 04.5 %   MCV 102.0 (H) 80.0 - 100.0 fL   MCH 30.3 26.0 - 34.0 pg   MCHC 29.7 (L) 30.0 - 36.0 g/dL   RDW 40.9 81.1 - 91.4 %   Platelets 163 150 - 400 K/uL   nRBC 0.0 0.0 - 0.2 %  Basic metabolic panel     Status: Abnormal   Collection Time: 10/11/23  3:51 AM  Result Value Ref Range   Sodium 143 135 - 145 mmol/L   Potassium 4.1 3.5 - 5.1 mmol/L    Chloride 103 98 - 111 mmol/L   CO2 32 22 - 32 mmol/L   Glucose, Bld 206 (H) 70 - 99 mg/dL   BUN 29 (H) 8 - 23 mg/dL   Creatinine, Ser 7.82 0.44 - 1.00 mg/dL   Calcium 8.0 (L) 8.9 - 10.3 mg/dL   GFR, Estimated >95 >62 mL/min   Anion gap 8 5 - 15    ASSESSMENT: Tasha Moss is a 87 y.o. female, 1 Day Post-Op s/p LEFT CLOSED REDUCTION HIP  CV/Blood loss: Acute blood loss anemia, Hgb 9.1 this morning. Hemodynamically stable  PLAN: Weightbearing: WBAT LLE ROM: Posterior hip precautions Showering: Okay to shower Orthopedic device(s): Hip abduction pillow Pain management:  1. Tylenol 500 mg q 6 hours PRN 2. Oxycodone 5 mg q 4 hours PRN VTE prophylaxis: Home dose aspirin and SCDs.  No additional chemical prophylaxis needed from ortho standpoint Foley/Lines:  No foley, KVO IVFs Dispo: PT/OT evaluation today.  Okay for discharge from ortho standpoint once cleared by medicine team and therapies  D/C recommendations: -Tylenol PRN, and possibly Oxycodone 5 mg for pain control -No DVT prophylaxis needed from ortho standpoint  Follow -  up plan: 2 weeks after discharge   Contact information:  Truitt Merle MD, Thyra Breed PA-C. After hours and holidays please check [email or web address] for group call information for [facility]   Thompson Caul, PA-C [phone] (office) [email or web address]

## 2023-10-11 NOTE — Evaluation (Addendum)
Occupational Therapy Evaluation Patient Details Name: Tasha Moss MRN: [id number] DOB: [birth year] Today's Date: 10/11/2023   History of Present Illness Pt is a 87 yr old female who presented from SNF due to falling out of WC. Pt was found to have a L hip dislocation and it was attempted to place back into socket but o2 stats dropped.  Pt s/p 10/10/23 closed reduction  of L periprosthetic hip dislocation and WBAT. PMH: HTN, PEG tube, CHF, pneumonia     Pallative consult   Clinical Impression   Pt present with husband in the room and reported that they have not attempted to stand in about 6 weeks   And has been at [facility] level. Pt was able to complete bed mobility with max x2 and then attempted lateral scoots with total assist x2 but was unable to complete. Pt requires moderate to max assist with UE ADLS and max to total assist with LB ADLS at bed level. Pt's husband would like to go back to SNF level with hospice care. Patient will benefit from continued inpatient follow up therapy, <3 hours/day.    If plan is discharge home, recommend the following: Two people to help with walking and/or transfers;Two people to help with bathing/dressing/bathroom;Direct supervision/assist for medications management;Direct supervision/assist for financial management;Assist for transportation    Functional Status Assessment  Patient has had a recent decline in their functional status and demonstrates the ability to make significant improvements in function in a reasonable and predictable amount of time.  Equipment Recommendations  None recommended by OT (TBD)    Recommendations for Other Services       Precautions / Restrictions Precautions Precautions: Posterior Hip Precaution Booklet Issued: Yes (comment) Restrictions Weight Bearing Restrictions: Yes LLE Weight Bearing: Weight bearing as tolerated      Mobility Bed Mobility Overal bed mobility: Needs Assistance Bed Mobility: Supine to Sit, Sit to  Supine     Supine to sit: +2 for safety/equipment, HOB elevated, +2 for physical assistance, Max assist Sit to supine: Max assist, +2 for physical assistance, +2 for safety/equipment, HOB elevated   General bed mobility comments: Pt required maxA+2 for bed mobility for trunk mobility and BLE into and out of bed; CGA up to modA to correct posterior lean once at EOB, pt sat EOB for ~74min, attempted STS and unable, total assist for scooting up to head of bed.    Transfers                   General transfer comment: Unsafe at present, attempted in session      Balance Overall balance assessment: Needs assistance Sitting-balance support: Feet supported, Bilateral upper extremity supported Sitting balance-Leahy Scale: Zero Sitting balance - Comments: Requires modA to prevent postserior lean Postural control: Posterior lean                                 ADL either performed or assessed with clinical judgement   ADL Overall ADL's : Needs assistance/impaired Eating/Feeding: NPO Eating/Feeding Details (indicate cue type and reason): peg tube Grooming: Wash/dry hands;Wash/dry face;Oral care;Moderate assistance;Sitting   Upper Body Bathing: Moderate assistance;Sitting;Bed level   Lower Body Bathing: Total assistance;Bed level;Cueing for safety;Cueing for sequencing   Upper Body Dressing : Moderate assistance;Sitting;Bed level   Lower Body Dressing: Total assistance;Bed level     Toilet Transfer Details (indicate cue type and reason): unable to complete Toileting- Clothing Manipulation and Hygiene: Total  assistance;Bed level     Tub/Shower Transfer Details (indicate cue type and reason): unable to complete   General ADL Comments: unable to complete     Vision         Perception         Praxis         Pertinent Vitals/Pain Pain Assessment Pain Assessment: No/denies pain     Extremity/Trunk Assessment Upper Extremity Assessment Upper Extremity  Assessment: Generalized weakness   Lower Extremity Assessment Lower Extremity Assessment: Defer to PT evaluation RLE Deficits / Details: Pt with functional ROM and strength though struggling with command following, grossly 3/5 RLE Sensation: WNL LLE Deficits / Details: Pt with functional ROM and strength though struggling with command following, grossly 3/5 LLE Sensation: WNL   Cervical / Trunk Assessment Cervical / Trunk Assessment: Kyphotic   Communication Communication Communication: Hearing impairment Cueing Techniques: Verbal cues   Cognition Arousal: Alert Behavior During Therapy: Flat affect Overall Cognitive Status: Within Functional Limits for tasks assessed                                 General Comments: Husband present and confirms at baseline     General Comments  Husband present in session    Exercises     Shoulder Instructions      Home Living Family/patient expects to be discharged to:: Skilled nursing facility Living Arrangements: Spouse/significant other;Children                               Additional Comments: Pt has not ambulated for >6weeks and has bee in the bed/WC      Prior Functioning/Environment Prior Level of Function : Needs assist       Physical Assist : Mobility (physical);ADLs (physical) Mobility (physical): Bed mobility;Transfers;Gait;Stairs ADLs (physical): Grooming;Bathing;Dressing;Toileting   ADLs Comments: Pt has not ambulated for >6weeks and has bee in the bed/WC        OT Problem List: Decreased strength;Decreased activity tolerance;Impaired balance (sitting and/or standing);Decreased safety awareness;Decreased knowledge of use of DME or AE;Cardiopulmonary status limiting activity      OT Treatment/Interventions: Self-care/ADL training;Therapeutic activities;Patient/family education;Balance training    OT Goals(Current goals can be found in the care plan section) Acute Rehab OT Goals Patient  Stated Goal: to get back to were they were prior OT Goal Formulation: With family Time For Goal Achievement: 10/25/23 Potential to Achieve Goals: Good  OT Frequency: Min 1X/week    Co-evaluation PT/OT/SLP Co-Evaluation/Treatment: Yes Reason for Co-Treatment: For patient/therapist safety;To address functional/ADL transfers PT goals addressed during session: Mobility/safety with mobility OT goals addressed during session: ADL's and self-care      AM-PAC OT "6 Clicks" Daily Activity     Outcome Measure Help from another person eating meals?: Total Help from another person taking care of personal grooming?: A Little Help from another person toileting, which includes using toliet, bedpan, or urinal?: A Lot Help from another person bathing (including washing, rinsing, drying)?: A Lot Help from another person to put on and taking off regular upper body clothing?: A Little Help from another person to put on and taking off regular lower body clothing?: A Lot 6 Click Score: 13   End of Session Equipment Utilized During Treatment: Gait belt Nurse Communication: Mobility status  Activity Tolerance: Patient tolerated treatment well Patient left: in bed;with call bell/phone within reach;with bed alarm set  OT Visit Diagnosis: Unsteadiness on feet (R26.81);Other abnormalities of gait and mobility (R26.89);Repeated falls (R29.6);Muscle weakness (generalized) (M62.81);History of falling (Z91.81)                Time: 1610-9604 OT Time Calculation (min): 36 min Charges:  OT General Charges $OT Visit: 1 Visit OT Evaluation $OT Eval Low Complexity: 1 Low   Presley Raddle OTR/L  Acute Rehab Services  [phone] office number   Alphia Moh 10/11/2023, 3:52 PM

## 2023-10-11 NOTE — Evaluation (Signed)
Physical Therapy Evaluation Patient Details Name: Tasha Moss MRN: [id number] DOB: Jul 08, 1936 Today's Date: 10/11/2023  History of Present Illness  Pt is a 87 yr old female who presented from SNF due to falling out of WC. Pt was found to have a L hip dislocation and it was attempted to place back into socket but o2 stats dropped.  Pt s/p 10/10/23 closed reduction  of L periprosthetic hip dislocation and WBAT. PMH: HTN, PEG tube, CHF, pneumonia     Pallative consult  Clinical Impression  Pt admitted with above diagnosis and POD1 the procedure above. Pt currently with functional limitations due to the deficits listed below (see PT Problem List). Pt required maxA+2 for bed mobility and total assist for scooting up to [facility] while sitting EOB. Husband present and very helpful with status at SNF and PLOF, pt is [facility] and hearing aides not present. Provided posterior hip precautions handout and reviewed with pt and spouse. Patient will benefit from continued inpatient follow up therapy, <3 hours/day. Pt will benefit from acute skilled PT to increase their independence and safety with mobility to allow discharge.          If plan is discharge home, recommend the following: Assist for transportation;Two people to help with bathing/dressing/bathroom;Two people to help with walking and/or transfers   Can travel by private vehicle   No    Equipment Recommendations None recommended by PT (TBD)  Recommendations for Other Services       Functional Status Assessment Patient has had a recent decline in their functional status and demonstrates the ability to make significant improvements in function in a reasonable and predictable amount of time.     Precautions / Restrictions Precautions Precautions: Posterior Hip Precaution Booklet Issued: Yes (comment) Restrictions Weight Bearing Restrictions: Yes LLE Weight Bearing: Weight bearing as tolerated      Mobility  Bed Mobility Overal bed mobility: Needs  Assistance Bed Mobility: Supine to Sit, Sit to Supine     Supine to sit: +2 for safety/equipment, HOB elevated, +2 for physical assistance, Max assist Sit to supine: Max assist, +2 for physical assistance, +2 for safety/equipment, HOB elevated   General bed mobility comments: Pt required maxA+2 for bed mobility for trunk mobility and BLE into and out of bed; CGA up to modA to correct posterior lean once at EOB, pt sat EOB for ~60min, attempted STS and unable, total assist for scooting up to head of bed.    Transfers                   General transfer comment: Unsafe at present/    Ambulation/Gait               General Gait Details: Unsafe at present  Stairs            Wheelchair Mobility     Tilt Bed    Modified Rankin (Stroke Patients Only)       Balance Overall balance assessment: Needs assistance Sitting-balance support: Feet supported, Bilateral upper extremity supported Sitting balance-Leahy Scale: Zero Sitting balance - Comments: Requires modA to prevent postserior lean Postural control: Posterior lean     Standing balance comment: Untested                             Pertinent Vitals/Pain Pain Assessment Pain Assessment: No/denies pain    Home Living Family/patient expects to be discharged to:: Skilled nursing facility Living Arrangements: Spouse/significant  other;Children                 Additional Comments: Pt has not ambulated for >6weeks and has bee in the bed/WC    Prior Function Prior Level of Function : Needs assist               ADLs Comments: Pt has not ambulated for >6weeks and has bee in the bed/WC     Extremity/Trunk Assessment   Upper Extremity Assessment Upper Extremity Assessment: Defer to OT evaluation    Lower Extremity Assessment Lower Extremity Assessment: RLE deficits/detail;LLE deficits/detail RLE Deficits / Details: Pt with functional ROM and strength though struggling with command  following, grossly 3/5 RLE Sensation: WNL LLE Deficits / Details: Pt with functional ROM and strength though struggling with command following, grossly 3/5 LLE Sensation: WNL    Cervical / Trunk Assessment Cervical / Trunk Assessment: Kyphotic  Communication   Communication Communication: Hearing impairment Cueing Techniques: Verbal cues;Tactile cues;Other (comments) (Low voice)  Cognition Arousal: Alert Behavior During Therapy: Flat affect Overall Cognitive Status: Within Functional Limits for tasks assessed                                 General Comments: Husband present and confirms at baseline        General Comments General comments (skin integrity, edema, etc.): Husband present    Exercises General Exercises - Lower Extremity Ankle Circles/Pumps: AROM, Both, 20 reps, Supine   Assessment/Plan    PT Assessment Patient needs continued PT services  PT Problem List Decreased strength;Decreased range of motion;Decreased activity tolerance;Decreased balance;Decreased mobility;Decreased coordination       PT Treatment Interventions DME instruction;Gait training;Stair training;Functional mobility training;Therapeutic activities;Therapeutic exercise;Balance training;Patient/family education;Wheelchair mobility training    PT Goals (Current goals can be found in the Care Plan section)  Acute Rehab PT Goals Patient Stated Goal: none stated PT Goal Formulation: With patient/family Time For Goal Achievement: 10/25/23 Potential to Achieve Goals: Good    Frequency Min 1X/week     Co-evaluation PT/OT/SLP Co-Evaluation/Treatment: Yes Reason for Co-Treatment: For patient/therapist safety;To address functional/ADL transfers PT goals addressed during session: Mobility/safety with mobility OT goals addressed during session: ADL's and self-care       AM-PAC PT "6 Clicks" Mobility  Outcome Measure Help needed turning from your back to your side while in a flat  bed without using bedrails?: Total Help needed moving from lying on your back to sitting on the side of a flat bed without using bedrails?: Total Help needed moving to and from a bed to a chair (including a wheelchair)?: Total Help needed standing up from a chair using your arms (e.g., wheelchair or bedside chair)?: Total Help needed to walk in hospital room?: Total Help needed climbing 3-5 steps with a railing? : Total 6 Click Score: 6    End of Session Equipment Utilized During Treatment: Gait belt;Oxygen (3L via [place]) Activity Tolerance: Patient tolerated treatment well;No increased pain Patient left: in bed;with call bell/phone within reach;with family/visitor present Nurse Communication: Mobility status PT Visit Diagnosis: Other abnormalities of gait and mobility (R26.89)    Time: 8657-8469 PT Time Calculation (min) (ACUTE ONLY): 36 min   Charges:   PT Evaluation $PT Eval Low Complexity: 1 Low   PT General Charges $$ ACUTE PT VISIT: 1 Visit         Jamesetta Geralds, PT, DPT [facility] Rehabilitation Department Office: [phone]  Jamesetta Geralds 10/11/2023,  1:39 PM

## 2023-10-11 NOTE — Progress Notes (Addendum)
Initial Nutrition Assessment  DOCUMENTATION CODES:   Not applicable  INTERVENTION:   -Resume TF via PEG:   Jevity 1.2 @ 50 ml/hr   60 ml Prosource TF daily  200  ml free water flush every 4 hours  Tube feeding regimen provides 1520 kcal (95% of needs), 87 grams of protein, and 968 ml of H2O. Total free water: 2168 ml daily  -1 packet Juven BID via tube, each packet provides 95 calories, 2.5 grams of protein (collagen), and 9.8 grams of carbohydrate (3 grams sugar); also contains 7 grams of L-arginine and L-glutamine, 300 mg vitamin C, 15 mg vitamin E, 1.2 mcg vitamin B-12, 9.5 mg zinc, 200 mg calcium, and 1.5 g  Calcium Beta-hydroxy-Beta-methylbutyrate to support wound healing   NUTRITION DIAGNOSIS:   Inadequate oral intake related to inability to eat, dysphagia as evidenced by NPO status.  GOAL:   Patient will meet greater than or equal to 90% of their needs  MONITOR:   TF tolerance  REASON FOR ASSESSMENT:   Consult Assessment of nutrition requirement/status, Enteral/tube feeding initiation and management  ASSESSMENT:   Pt with medical history significant for hypothyroidism, hypertension, GERD, hyperlipidemia and PEG tube who presents with left hip dislocation.Marland Kitchen Pt from SNF.  Pt admitted with lt hip dislocation.   11/1- s/p BSE- Closed reduction of left hip dislocation   Reviewed I/O's: +676 ml x 24 hours  UOP: 200 ml x 24 hours  Pt unavailable at time of visit. Attempted to speak with pt via call to hospital room phone, however, unable to reach. RD unable to obtain further nutrition-related history or complete nutrition-focused physical exam at this time.     Per previous RD notes, pt typically consumes very little (about 4 ounces of thickened liquids and ice chips). Pt is ordered a heart healthy diet, however, does not eat this. Pt receives to majority of her nutrition via PEG feedings.   Typical home TF regimen is Jevity 1.5 4 cartons plus 200 ml water at 60  ml/h x 18 hours from 4PM to 10AM. This provides 1420 kcal, 60 gm protein, 920 ml free water daily.   Wt has been stable over the past month.   Palliative care following for goals of care; likely plan to discharge back to SNF with hospice. Pt is from [facility].   Lab Results  Component Value Date   HGBA1C 5.5 01/04/2015   PTA DM medications are none.   Labs reviewed: CBGS: 118 (inpatient orders for glycemic control are none).    Diet Order:   Diet Order             Diet Heart Room service appropriate? Yes; Fluid consistency: Thin  Diet effective now                   EDUCATION NEEDS:   No education needs have been identified at this time  Skin:  Skin Assessment: Skin Integrity Issues: Skin Integrity Issues:: Stage II, Incisions Stage II: sacrum Incisions: closed lt leg  Last BM:  10/09/23  Height:   Ht Readings from Last 1 Encounters:  10/10/23 5\' 5"  (1.651 m)    Weight:   Wt Readings from Last 1 Encounters:  10/10/23 65.7 kg    Ideal Body Weight:  56.8 kg  BMI:  Body mass index is 24.09 kg/m.  Estimated Nutritional Needs:   Kcal:  1600-1800  Protein:  85-100 grams  Fluid:  > 1.6 L    Levada Schilling, RD, LDN,  CDCES Registered Dietitian III Certified Diabetes Care and Education Specialist Please refer to [facility] for RD and/or RD on-call/weekend/after hours pager

## 2023-10-13 ENCOUNTER — Encounter: Payer: Self-pay | Admitting: Student

## 2023-10-13 ENCOUNTER — Non-Acute Institutional Stay (SKILLED_NURSING_FACILITY): Payer: PRIVATE HEALTH INSURANCE | Admitting: Student

## 2023-10-13 DIAGNOSIS — R1312 Dysphagia, oropharyngeal phase: Secondary | ICD-10-CM | POA: Diagnosis not present

## 2023-10-13 DIAGNOSIS — T84021D Dislocation of internal left hip prosthesis, subsequent encounter: Secondary | ICD-10-CM | POA: Diagnosis not present

## 2023-10-13 DIAGNOSIS — I700 Atherosclerosis of aorta: Secondary | ICD-10-CM | POA: Diagnosis not present

## 2023-10-13 DIAGNOSIS — I959 Hypotension, unspecified: Secondary | ICD-10-CM

## 2023-10-13 DIAGNOSIS — Z931 Gastrostomy status: Secondary | ICD-10-CM

## 2023-10-13 DIAGNOSIS — H8110 Benign paroxysmal vertigo, unspecified ear: Secondary | ICD-10-CM

## 2023-10-13 DIAGNOSIS — K219 Gastro-esophageal reflux disease without esophagitis: Secondary | ICD-10-CM | POA: Diagnosis not present

## 2023-10-13 DIAGNOSIS — H8103 Meniere's disease, bilateral: Secondary | ICD-10-CM

## 2023-10-13 DIAGNOSIS — E440 Moderate protein-calorie malnutrition: Secondary | ICD-10-CM

## 2023-10-13 DIAGNOSIS — J3089 Other allergic rhinitis: Secondary | ICD-10-CM | POA: Diagnosis not present

## 2023-10-13 DIAGNOSIS — I5032 Chronic diastolic (congestive) heart failure: Secondary | ICD-10-CM | POA: Diagnosis not present

## 2023-10-13 DIAGNOSIS — K222 Esophageal obstruction: Secondary | ICD-10-CM | POA: Diagnosis not present

## 2023-10-13 DIAGNOSIS — W19XXXD Unspecified fall, subsequent encounter: Secondary | ICD-10-CM

## 2023-10-13 DIAGNOSIS — E063 Autoimmune thyroiditis: Secondary | ICD-10-CM | POA: Diagnosis not present

## 2023-10-13 DIAGNOSIS — F418 Other specified anxiety disorders: Secondary | ICD-10-CM

## 2023-10-13 NOTE — Progress Notes (Signed)
Location:  [facility]   Place of Service:   Patient's Room 145, [facility]   CODE STATUS: DNR- Comfort Measures Desired. Do not transfer to hospital unless comfort needs cannot be met in facility.   Allergies  Allergen Reactions   Gabapentin Nausea And Vomiting    hallucinations   Other Nausea And Vomiting    Strong pain medications   Morphine Nausea And Vomiting    CC: Fall on 10/31  HPI:  Follow-up today after a fall from her wheelchair and subsequent hospitalization on 10/31. She dislocated her L hip and ultimately required closed reduction in the OR with orthopedic surgery on 11/1. She discharged from the hospital on 11/2 and returned to [facility].  She tells me that her pain is generally well-controlled this morning.  Her husband is at bedside and likewise reports that she does not seem to be in an undue amount of pain.  They are both looking forward to having her reengage physical therapy today.  Past Medical History:  Diagnosis Date   Allergy    seasonal & dust   Anxiety    Arthritis    DJD   Cellulitis    Family history of adverse reaction to anesthesia    n/v    GERD (gastroesophageal reflux disease)    History of vertigo    Hyperlipidemia    Hypertension    Hypothyroidism    PONV (postoperative nausea and vomiting)    Thyroid disease    hypothyroidism post Hashimoto's & partial thyroidectomy    Past Surgical History:  Procedure Laterality Date   BIOPSY  01/11/2022   Procedure: BIOPSY;  Surgeon: Meridee Score Netty Starring., MD;  Location: [facility];  Service: Gastroenterology;;   CATARACT EXTRACTION W/ INTRAOCULAR LENS  IMPLANT, BILATERAL     COLONOSCOPY     diverticulosis   ESOPHAGOGASTRODUODENOSCOPY (EGD) WITH PROPOFOL N/A 01/11/2022   Procedure: ESOPHAGOGASTRODUODENOSCOPY (EGD) WITH PROPOFOL;  Surgeon: Lemar Lofty., MD;  Location: [facility];  Service: Gastroenterology;  Laterality: N/A;   GASTROSTOMY N/A 01/17/2022   Procedure: OPEN  GASTROSTOMY PLACEMENT;  Surgeon: Harriette Bouillon, MD;  Location: [facility];  Service: General;  Laterality: N/A;   HIP CLOSED REDUCTION Left 10/10/2023   Procedure: CLOSED REDUCTION HIP;  Surgeon: Roby Lofts, MD;  Location: [facility];  Service: Orthopedics;  Laterality: Left;   IR GASTROSTOMY TUBE MOD SED  01/15/2022   IR REPLACE G-TUBE SIMPLE WO FLUORO  03/13/2023   IR REPLACE G-TUBE SIMPLE WO FLUORO  10/06/2023   IR REPLC GASTRO/COLONIC TUBE PERCUT W/FLUORO  02/20/2023   JOINT REPLACEMENT  1995 & 1999   THR bilaterally    lumbar disc repair  1989   Dr Mercy Riding   SAVORY DILATION N/A 01/11/2022   Procedure: Gaspar Bidding DILATION;  Surgeon: Lemar Lofty., MD;  Location: [facility];  Service: Gastroenterology;  Laterality: N/A;   SPINE SURGERY     cervical ; Dr Venetia Maxon   THYROIDECTOMY, PARTIAL     benign nodule   TONSILLECTOMY AND ADENOIDECTOMY     TOTAL SHOULDER ARTHROPLASTY Left 02/27/2016   Procedure: LEFT REVERSE TOTAL SHOULDER ARTHROPLASTY;  Surgeon: Sheral Apley, MD;  Location: [facility];  Service: Orthopedics;  Laterality: Left;   vitreous detachment     Dr Nelle Don    Social History   Socioeconomic History   Marital status: Married    Spouse name: Not on file   Number of children: 2   Years of education: Not on file  Highest education level: Not on file  Occupational History   Not on file  Tobacco Use   Smoking status: Never   Smokeless tobacco: Never  Vaping Use   Vaping status: Never Used  Substance and Sexual Activity   Alcohol use: No   Drug use: No   Sexual activity: Not on file  Other Topics Concern   Not on file  Social History Narrative   Married for 63 years    Daughter-nurse   Daughter- paralegal       Right handed   Social Determinants of Health   Financial Resource Strain: Low Risk  (02/04/2023)   Overall Financial Resource Strain (CARDIA)    Difficulty of Paying Living Expenses: Not hard at all  Food Insecurity: No Food Insecurity  (10/10/2023)   Hunger Vital Sign    Worried About Running Out of Food in the Last Year: Never true    Ran Out of Food in the Last Year: Never true  Transportation Needs: No Transportation Needs (10/10/2023)   PRAPARE - Administrator, Civil Service (Medical): No    Lack of Transportation (Non-Medical): No  Physical Activity: Sufficiently Active (02/04/2023)   Exercise Vital Sign    Days of Exercise per Week: 5 days    Minutes of Exercise per Session: 30 min  Stress: No Stress Concern Present (02/04/2023)   [employer] of Occupational Health - Occupational Stress Questionnaire    Feeling of Stress : Not at all  Social Connections: Socially Integrated (02/04/2023)   Social Connection and Isolation Panel [NHANES]    Frequency of Communication with Friends and Family: More than three times a week    Frequency of Social Gatherings with Friends and Family: More than three times a week    Attends Religious Services: More than 4 times per year    Active Member of [employer] or Organizations: Yes    Attends Engineer, structural: More than 4 times per year    Marital Status: Married  Catering manager Violence: Not At Risk (10/10/2023)   Humiliation, Afraid, Rape, and Kick questionnaire    Fear of Current or Ex-Partner: No    Emotionally Abused: No    Physically Abused: No    Sexually Abused: No   Family History  Problem Relation Age of Onset   Diabetes Mother    Hepatitis Mother        Hepatitis B,? etiology   Miscarriages / Stillbirths Mother    Stroke Mother 80   Heart disease Father        CHF   Cancer Paternal Aunt        GI cancers   Heart disease Paternal Aunt        CHF   Cancer Paternal Uncle        lung, GI   Heart disease Paternal Uncle        CHF   Colon cancer Neg Hx    Esophageal cancer Neg Hx    Pancreatic cancer Neg Hx    Rectal cancer Neg Hx    Stomach cancer Neg Hx       VITAL SIGNS There were no vitals taken for this visit.  Outpatient  Encounter Medications as of 10/13/2023  Medication Sig   acetaminophen (TYLENOL) 500 MG tablet Place 500 mg into feeding tube every 6 (six) hours as needed for moderate pain (pain score 4-6).   albuterol (VENTOLIN HFA) 108 (90 Base) MCG/ACT inhaler Inhale 2 puffs into the lungs every  6 (six) hours as needed for wheezing or shortness of breath.   aspirin EC 81 MG tablet Take 1 tablet (81 mg total) by mouth daily. Swallow whole.   calcium carbonate (TUMS - DOSED IN MG ELEMENTAL CALCIUM) 500 MG chewable tablet Chew 1 tablet by mouth daily.   chlorhexidine (HIBICLENS) 4 % external liquid Apply 15 mLs (1 Application total) topically as directed for 30 doses. Use as directed daily for 5 days every other week for 6 weeks.   FLUoxetine (PROZAC) 20 MG capsule TAKE 1 CAPSULE BY MOUTH DAILY.   fluticasone (FLONASE) 50 MCG/ACT nasal spray Place 1 spray into both nostrils as needed for allergies or rhinitis.   furosemide (LASIX) 10 MG/ML solution Take 20 mg by mouth daily.   ipratropium-albuterol (DUONEB) 0.5-2.5 (3) MG/3ML SOLN Take 3 mLs by nebulization every 6 (six) hours.   levothyroxine (SYNTHROID) 112 MCG tablet TAKE ONE TABLET BY MOUTH ONCE DAILY.   loratadine (CLARITIN) 10 MG tablet Take 10 mg by mouth daily.   meclizine (ANTIVERT) 25 MG tablet Take 25 mg by mouth 3 (three) times daily as needed for dizziness.   metoCLOPramide (REGLAN) 5 MG tablet Take 1 tablet (5 mg total) by mouth in the morning and at bedtime.   midodrine (PROAMATINE) 2.5 MG tablet Take 2.5 mg by mouth 3 (three) times daily with meals.   mupirocin ointment (BACTROBAN) 2 % Place 1 Application into the nose 2 (two) times daily for 60 doses. Use as directed 2 times daily for 5 days every other week for 6 weeks.   Nutritional Supplements (FEEDING SUPPLEMENT, JEVITY 1.5 CAL/FIBER,) LIQD Place 65 mL/hr into feeding tube continuous. From 4 PM to 10 AM   nystatin (MYCOSTATIN/NYSTOP) powder Apply 1 Application topically 3 (three) times daily.    omeprazole (PRILOSEC) 40 MG capsule Take 40 mg by mouth 2 (two) times daily.   ondansetron (ZOFRAN-ODT) 8 MG disintegrating tablet Take 8 mg by mouth every 6 (six) hours as needed for nausea or vomiting. Also give prior to therapy   Polyvinyl Alcohol-Povidone (ARTIFICIAL TEARS) 5-6 MG/ML SOLN Place 1 drop into both eyes daily.   potassium chloride 20 MEQ/15ML (10%) SOLN Take 20 mEq by mouth 2 (two) times daily.   Protein (FEEDING SUPPLEMENT, PROSOURCE TF20,) liquid Place 60 mLs into feeding tube 2 (two) times daily.   Water For Irrigation, Sterile (STERILE WATER FOR IRRIGATION) Irrigate with 100 mLs as directed every 4 (four) hours. Free water   No facility-administered encounter medications on file as of 10/13/2023.     SIGNIFICANT DIAGNOSTIC EXAMS  Gen: Frail and elderly, chronically ill appearing but NAD  Cardio: RRR, no murmur Pulm: Normal WOB on RA, lungs clear anteriorly Ext: BL hips without deformity or TTP. Hip abduction pillow is in place, L foot is somewhat internally rotated > R, she is able to wiggle toes but cannot actively externally rotate the foot. She has no pain to passive external rotation.      ASSESSMENT/ PLAN:  Fall S/p closed reduction of L hip dislocation. Patient with limited activity at baseline. Should be seen by PT today for fall follow-up. Hopeful she can remain pain free and ultimately return to her pre-fall baseline. Though the biggest benefit she stands to derive is from preventing future falls in order to honor her desire to avoid hospital transfer and attend to her comfort and dignity. She is in good spirits today. - Post op Hgb was 9.1 mg/dL, however, given desire for attention to comfort, would not  re-check at this time. Not on thinners, but does take 81mg  ASA daily. No significant bruising on exam.     Eliezer Mccoy, MD Contact [phone] Monday through Friday 8am- 5pm  After hours call [phone]

## 2023-10-14 ENCOUNTER — Encounter: Payer: Self-pay | Admitting: Internal Medicine

## 2023-10-14 ENCOUNTER — Ambulatory Visit (HOSPITAL_COMMUNITY): Payer: Medicare Other | Admitting: Speech Pathology

## 2023-10-14 ENCOUNTER — Non-Acute Institutional Stay (SKILLED_NURSING_FACILITY): Payer: PRIVATE HEALTH INSURANCE | Admitting: Internal Medicine

## 2023-10-14 DIAGNOSIS — E440 Moderate protein-calorie malnutrition: Secondary | ICD-10-CM

## 2023-10-14 DIAGNOSIS — J9601 Acute respiratory failure with hypoxia: Secondary | ICD-10-CM

## 2023-10-14 DIAGNOSIS — R627 Adult failure to thrive: Secondary | ICD-10-CM | POA: Diagnosis not present

## 2023-10-14 DIAGNOSIS — D510 Vitamin B12 deficiency anemia due to intrinsic factor deficiency: Secondary | ICD-10-CM | POA: Diagnosis not present

## 2023-10-14 NOTE — Addendum Note (Signed)
Addended by: Sharee Holster on: 10/14/2023 11:05 AM   Modules accepted: Orders

## 2023-10-14 NOTE — Assessment & Plan Note (Signed)
O2 sats are adequate on low-flow oxygen.  She does exhibit bilateral rales.  She remains at high risk for aspiration.  Morphine is a consideration to decrease intrathoracic pressure if she develops respiratory compromise.

## 2023-10-14 NOTE — Assessment & Plan Note (Signed)
There was slight progression in her anemia while hospitalized with final values of 9.1/30.6.  Although MCV was 102.4; B12 level was 772.  No change indicated.

## 2023-10-14 NOTE — Progress Notes (Unsigned)
   NURSING HOME LOCATION:  [facility] ROOM NUMBER:  [id number]  CODE STATUS:  DNR  PCP:  Synthia Innocent NP  This is a nursing facility follow up visit for specific acute issue of Nursing Facility readmission within 30 days  Interim medical record and care since last SNF visit was updated with review of diagnostic studies and change in clinical status since last visit were documented.  HPI: She was hospitalized 10/31 - 10/11/2023 to treat a left hip dislocation.  On 10/31 she was sitting in the wheelchair and complained of back pain.  A pillow was placed behind her resulting in her sliding to the floor and sustaining a dislocation.  The dislocation could not be reduced in the ED and she was transferred for orthopedic intervention on 11/2 for closed reduction of the dislocation.   While hospitalized glucoses ranged from 174 up to 206.  There is no current A1c on file.  She did exhibit mild prerenal azotemia with BUN ranging from 29-31.  Although there was no overt bleeding H/H dropped from 11.5/38.2 down to 9.1/30.6.  MCV was 102.4; B12 level was well within normal limits at 772. Hospice consulted at the request of the family prior to her discharge back to this facility..  Review of systems: Could not be completed as she was profoundly somnolent.  Her speech was garbled.  She seemed to indicate she was still having some hip pain but then indicated "just had some."  Apparently she was indicating she had received some pain medication.  Physical exam:  Pertinent or positive findings: She was initially asleep talking indiscernibly repeatedly opening and closing her mouth.Marland Kitchen  Upon awakening she exhibited somewhat of a grimacing facies .She had difficulty following commands. Speech was garbled and difficult to discern even after awakening.  Hair was disheveled.  She is wearing nasal oxygen.  Slight tachycardia was suggested.  There is slight increase in both S1 and S2.  Heart rhythm was slightly  irregular.  She has inspiratory rales bilaterally.  PEG tube is present.  Pedal pulses are decreased.  She has interosseous wasting of the hands.  The lower extremities are supported in Styrofoam braces.  She exhibited myoclonal jerking of her hands ,more so on the right than the left.  General appearance:  no acute distress, increased work of breathing is present.   Lymphatic: No lymphadenopathy about the head, neck, axilla. Eyes: No conjunctival inflammation or lid edema is present. There is no scleral icterus. Ears:  External ear exam shows no significant lesions or deformities.   Nose:  External nasal examination shows no deformity or inflammation. Nasal mucosa are pink and moist without lesions, exudates Heart:  No gallop, murmur, click, rub .  Lungs:  without wheezes, rhonchi, rubs. Abdomen: Bowel sounds are normal. Abdomen is soft and nontender with no organomegaly, hernias, masses. GU: Deferred  Extremities:  No cyanosis, clubbing, edema  Neurologic exam :Balance, Rhomberg, finger to nose testing could not be completed due to clinical state Skin: Warm & dry w/o tenting. No significant lesions or rash.  See summary under each active problem in the Problem List with associated updated therapeutic plan

## 2023-10-14 NOTE — Assessment & Plan Note (Signed)
Despite the continuation of tube feeding; clinically she has moderate-severe protein/caloric malnutrition.  Interosseous wasting arm atrophy is present on exam.  Hospice has been called and; tube feedings will continue as she is unable to take p.o.'s due to dysphagia.

## 2023-10-14 NOTE — Progress Notes (Signed)
Location:  [facility] Room Number: 145 Place of Service:  SNF (31)   CODE STATUS: dnr   Allergies  Allergen Reactions   Gabapentin Nausea And Vomiting    hallucinations   Other Nausea And Vomiting    Strong pain medications   Morphine Nausea And Vomiting    Chief Complaint  Patient presents with   Hospitalization Follow-up    HPI:  She is a 87 year old long term resident of this facility who has been hospitalized from 10-09-23 through 10-11-23. Her medical history includes: hypothyroidism; hypotension; GERD peg tube feeding. On the day of admission she had a fall out of her wheelchair and dislocated her left hip. This was her second dislocation  in the past 2 years. She is status post closed reduction on 10-10-23. She will be followed by hospice care. She will continue to be followed for her chronic illnesses including:   Hypotension:     Non-seasonal allergic rhinitis unspecified trigger: Dysphagia unspecified type; GERD without esophagitis; stricture and stenosis of esophagus:     Past Medical History:  Diagnosis Date   Allergy    seasonal & dust   Anxiety    Arthritis    DJD   Cellulitis    Family history of adverse reaction to anesthesia    n/v    GERD (gastroesophageal reflux disease)    History of vertigo    Hyperlipidemia    Hypertension    Hypothyroidism    PONV (postoperative nausea and vomiting)    Thyroid disease    hypothyroidism post Hashimoto's & partial thyroidectomy    Past Surgical History:  Procedure Laterality Date   BIOPSY  01/11/2022   Procedure: BIOPSY;  Surgeon: Lemar Lofty., MD;  Location: [facility];  Service: Gastroenterology;;   CATARACT EXTRACTION W/ INTRAOCULAR LENS  IMPLANT, BILATERAL     COLONOSCOPY     diverticulosis   ESOPHAGOGASTRODUODENOSCOPY (EGD) WITH PROPOFOL N/A 01/11/2022   Procedure: ESOPHAGOGASTRODUODENOSCOPY (EGD) WITH PROPOFOL;  Surgeon: Lemar Lofty., MD;  Location: [facility];  Service: Gastroenterology;  Laterality: N/A;   GASTROSTOMY N/A 01/17/2022   Procedure: OPEN GASTROSTOMY PLACEMENT;  Surgeon: Harriette Bouillon, MD;  Location: [facility];  Service: General;  Laterality: N/A;   HIP CLOSED REDUCTION Left 10/10/2023   Procedure: CLOSED REDUCTION HIP;  Surgeon: Roby Lofts, MD;  Location: [facility];  Service: Orthopedics;  Laterality: Left;   IR GASTROSTOMY TUBE MOD SED  01/15/2022   IR REPLACE G-TUBE SIMPLE WO FLUORO  03/13/2023   IR REPLACE G-TUBE SIMPLE WO FLUORO  10/06/2023   IR REPLC GASTRO/COLONIC TUBE PERCUT W/FLUORO  02/20/2023   JOINT REPLACEMENT  1995 & 1999   THR bilaterally    lumbar disc repair  1989   Dr Mercy Riding   SAVORY DILATION N/A 01/11/2022   Procedure: Gaspar Bidding DILATION;  Surgeon: Lemar Lofty., MD;  Location: [facility];  Service: Gastroenterology;  Laterality: N/A;   SPINE SURGERY     cervical ; Dr Venetia Maxon   THYROIDECTOMY, PARTIAL     benign nodule   TONSILLECTOMY AND ADENOIDECTOMY     TOTAL SHOULDER ARTHROPLASTY Left 02/27/2016   Procedure: LEFT REVERSE TOTAL SHOULDER ARTHROPLASTY;  Surgeon: Sheral Apley, MD;  Location: [facility];  Service: Orthopedics;  Laterality: Left;   vitreous detachment     Dr Nelle Don    Social History   Socioeconomic History   Marital status: Married    Spouse name: Not on file  Number of children: 2   Years of education: Not on file   Highest education level: Not on file  Occupational History   Not on file  Tobacco Use   Smoking status: Never   Smokeless tobacco: Never  Vaping Use   Vaping status: Never Used  Substance and Sexual Activity   Alcohol use: No   Drug use: No   Sexual activity: Not on file  Other Topics Concern   Not on file  Social History Narrative   Married for 63 years    Daughter-nurse   Daughter- paralegal       Right handed   Social Determinants of Health   Financial Resource Strain: Low Risk  (02/04/2023)   Overall Financial Resource Strain (CARDIA)     Difficulty of Paying Living Expenses: Not hard at all  Food Insecurity: No Food Insecurity (10/10/2023)   Hunger Vital Sign    Worried About Running Out of Food in the Last Year: Never true    Ran Out of Food in the Last Year: Never true  Transportation Needs: No Transportation Needs (10/10/2023)   PRAPARE - Administrator, Civil Service (Medical): No    Lack of Transportation (Non-Medical): No  Physical Activity: Sufficiently Active (02/04/2023)   Exercise Vital Sign    Days of Exercise per Week: 5 days    Minutes of Exercise per Session: 30 min  Stress: No Stress Concern Present (02/04/2023)   [employer] of Occupational Health - Occupational Stress Questionnaire    Feeling of Stress : Not at all  Social Connections: Socially Integrated (02/04/2023)   Social Connection and Isolation Panel [NHANES]    Frequency of Communication with Friends and Family: More than three times a week    Frequency of Social Gatherings with Friends and Family: More than three times a week    Attends Religious Services: More than 4 times per year    Active Member of [employer] or Organizations: Yes    Attends Engineer, structural: More than 4 times per year    Marital Status: Married  Catering manager Violence: Not At Risk (10/10/2023)   Humiliation, Afraid, Rape, and Kick questionnaire    Fear of Current or Ex-Partner: No    Emotionally Abused: No    Physically Abused: No    Sexually Abused: No   Family History  Problem Relation Age of Onset   Diabetes Mother    Hepatitis Mother        Hepatitis B,? etiology   Miscarriages / Stillbirths Mother    Stroke Mother 42   Heart disease Father        CHF   Cancer Paternal Aunt        GI cancers   Heart disease Paternal Aunt        CHF   Cancer Paternal Uncle        lung, GI   Heart disease Paternal Uncle        CHF   Colon cancer Neg Hx    Esophageal cancer Neg Hx    Pancreatic cancer Neg Hx    Rectal cancer Neg Hx     Stomach cancer Neg Hx       VITAL SIGNS BP 112/65   Pulse 97   Temp (!) 97.3 F (36.3 C)   Resp 18   Ht 5\' 5"  (1.651 m)   Wt 145 lb 8 oz (66 kg)   SpO2 95%   BMI 24.21 kg/m   Outpatient  Encounter Medications as of 10/13/2023  Medication Sig   acetaminophen (TYLENOL) 500 MG tablet Place 500 mg into feeding tube every 6 (six) hours as needed for moderate pain (pain score 4-6).   albuterol (VENTOLIN HFA) 108 (90 Base) MCG/ACT inhaler Inhale 2 puffs into the lungs every 6 (six) hours as needed for wheezing or shortness of breath.   aspirin EC 81 MG tablet Take 1 tablet (81 mg total) by mouth daily. Swallow whole.   calcium carbonate (TUMS - DOSED IN MG ELEMENTAL CALCIUM) 500 MG chewable tablet Chew 1 tablet by mouth daily.   chlorhexidine (HIBICLENS) 4 % external liquid Apply 15 mLs (1 Application total) topically as directed for 30 doses. Use as directed daily for 5 days every other week for 6 weeks.   FLUoxetine (PROZAC) 20 MG capsule TAKE 1 CAPSULE BY MOUTH DAILY.   fluticasone (FLONASE) 50 MCG/ACT nasal spray Place 1 spray into both nostrils as needed for allergies or rhinitis.   furosemide (LASIX) 10 MG/ML solution Take 20 mg by mouth daily.   ipratropium-albuterol (DUONEB) 0.5-2.5 (3) MG/3ML SOLN Take 3 mLs by nebulization every 6 (six) hours.   levothyroxine (SYNTHROID) 112 MCG tablet TAKE ONE TABLET BY MOUTH ONCE DAILY.   loratadine (CLARITIN) 10 MG tablet Take 10 mg by mouth daily.   meclizine (ANTIVERT) 25 MG tablet Take 25 mg by mouth 3 (three) times daily as needed for dizziness.   metoCLOPramide (REGLAN) 5 MG tablet Take 1 tablet (5 mg total) by mouth in the morning and at bedtime.   midodrine (PROAMATINE) 2.5 MG tablet Take 2.5 mg by mouth 3 (three) times daily with meals.   mupirocin ointment (BACTROBAN) 2 % Place 1 Application into the nose 2 (two) times daily for 60 doses. Use as directed 2 times daily for 5 days every other week for 6 weeks.   Nutritional Supplements  (FEEDING SUPPLEMENT, JEVITY 1.5 CAL/FIBER,) LIQD Place 65 mL/hr into feeding tube continuous. From 4 PM to 10 AM   nystatin (MYCOSTATIN/NYSTOP) powder Apply 1 Application topically 3 (three) times daily.   omeprazole (PRILOSEC) 40 MG capsule Take 40 mg by mouth 2 (two) times daily.   ondansetron (ZOFRAN-ODT) 8 MG disintegrating tablet Take 8 mg by mouth every 6 (six) hours as needed for nausea or vomiting. Also give prior to therapy   Polyvinyl Alcohol-Povidone (ARTIFICIAL TEARS) 5-6 MG/ML SOLN Place 1 drop into both eyes daily.   potassium chloride 20 MEQ/15ML (10%) SOLN Take 20 mEq by mouth 2 (two) times daily.   Protein (FEEDING SUPPLEMENT, PROSOURCE TF20,) liquid Place 60 mLs into feeding tube 2 (two) times daily.   Water For Irrigation, Sterile (STERILE WATER FOR IRRIGATION) Irrigate with 100 mLs as directed every 4 (four) hours. Free water   No facility-administered encounter medications on file as of 10/13/2023.     SIGNIFICANT DIAGNOSTIC EXAMS  PREVIOUS  08-27-23: chest x-ray: mild chf   PREVIOUS   08-21-23: wbc 8.0; hgb 12.8; hct 40.1; mcv 98. 3plt 164; glucose 131; bun 28; creat 0.52; k+ 4.2; na++ 134; ca 8.3; gfr >60; protein 7.9; albumin 3.0; mag 2.0 08-22-23: tsh 0.742 08-24-23: glucose 156; bun 26; creat 0.54; k+ 3.6; na++ 135; ca 8.3 gfr >60  09-01-23: glucose 179; bun 31; creat 0.51; k+ 3.7; na++ 150; ca 8.5 gfr >60 BNP 58  09-04-23: glucose 199; bun 37; creat 0.58; k+ 3.5; na++ 147 ca 8.4 gfr >60 09-09-23: glucose 147; bun 29; creat 0.58; k+ 2.9; na++ 146; ca 7.8 gfr >60  TODAY  09-10-23: glucose 204; bun 30; creat 0.51; k+ 3.0; na++ 145; ca 7.8; gfr >60 09-15-23: glucose 162; bun 27; creat 0.48; k+ 4.3; na++ 133; ca 8.0; gfr >60 BNP 45 09-22-23: wbc 6.3; hgb 11.9; hct 39.6; mcv 100.8 plt 158; glucose 112; bun 26; creat 0.51; k+ 4.6; na++ 139; ca 8.4; gfr >60  10-09-23: wbv 6.1; hgb 11.5; hct 38.2; mcv 102.4 plt 209; glucose 174; bun 31; creat 0.53; k+ 4.3; na++ 145; ca 8.4  gfr >60 10-10-23: vitamin B12: 772 folate 24.1 10-11-23: wbc 6.7; hgb 9,1; hct 30.6; mcv 102.0 plt 163; glucose 206; bun 29; creat 0.52; k+ 4.1; na++ 143; ca 8.0; plt >60    Review of Systems  Reason unable to perform ROS: unable to fully participate.   Physical Exam Constitutional:      General: She is not in acute distress.    Appearance: She is well-developed. She is not diaphoretic.  Neck:     Thyroid: No thyromegaly.  Cardiovascular:     Rate and Rhythm: Normal rate and regular rhythm.     Pulses: Normal pulses.     Heart sounds: Normal heart sounds.  Pulmonary:     Effort: Pulmonary effort is normal. No respiratory distress.     Breath sounds: Normal breath sounds.  Abdominal:     General: Bowel sounds are normal. There is no distension.     Palpations: Abdomen is soft.     Tenderness: There is no abdominal tenderness.     Comments: Left upper quad peg tube   Musculoskeletal:     Cervical back: Neck supple.     Right lower leg: No edema.     Left lower leg: No edema.     Comments: Neck contracture  Abductor pillow in place left foot inverted   Lymphadenopathy:     Cervical: No cervical adenopathy.  Skin:    General: Skin is warm and dry.  Neurological:     Mental Status: She is alert. Mental status is at baseline.     Comments:  09-16-23: BCAT 27/50   Psychiatric:        Mood and Affect: Mood normal.       ASSESSMENT/ PLAN:  TODAY  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 subsequent encounter: has abductor pillow in place. Will be followed by hospice care.   Hypotension: has soft readings: will continue  midodrine 2.5 mg three times daily   3. Non-seasonal allergic rhinitis unspecified trigger: will continue claritin 10 mg daily  4. Dysphagia unspecified type; GERD without esophagitis; stricture and stenosis of esophagus: is on long term tube feedings; will continue prilosec 40 mg twice daily and reglan 5 mg twice daily   5. Hashimotos  thyroiditis: is status post partial thyroidectomy; tsh 0.742; will continue synthroid 112 mcg daily   6. Depression with anxiety: will continue prozac 20 mg daily   7. Moderate protein calorie malnutrition: albumin 3.0 will continue prosource 60 mL twice daily   8. Bilateral lower extremity edema: will continue lasix 20 mg every  day with k+ 20 meq twice daily   9. Aortic atherosclerosis: ( ct 08-21-23) no statin due to advanced age.   10. Meniere's disease has meclizine 25 mg three times daily as needed.     Synthia Innocent NP [facility]   call [phone]

## 2023-10-14 NOTE — Assessment & Plan Note (Signed)
At this time tube feedings will be continued; family does wish to pursue Hospice.

## 2023-10-14 NOTE — Addendum Note (Signed)
Addended by: Sharee Holster on: 10/14/2023 10:59 AM   Modules accepted: Level of Service

## 2023-10-14 NOTE — Patient Instructions (Signed)
See assessment and plan under each diagnosis in the problem list and acutely for this visit 

## 2023-10-22 ENCOUNTER — Other Ambulatory Visit: Payer: Self-pay | Admitting: Adult Health

## 2023-10-22 MED ORDER — LORAZEPAM 2 MG/ML PO CONC: 0.5000 mg | ORAL | 0 refills | Status: DC | PRN

## 2023-10-27 ENCOUNTER — Telehealth: Payer: Self-pay | Admitting: Adult Health

## 2023-10-27 NOTE — Telephone Encounter (Signed)
Call from funeral home or family notifying:Daughter  Who is calling: Verlee Rossetti   Call back number: [phone]  Date of death: 11-08-2023  Place of death:          Time of death:             (Please indicate AM/PM)     Autopsy performed:  Funeral Home:  Contact name and phone number: Verlee Rossetti [phone]

## 2023-10-27 NOTE — Telephone Encounter (Signed)
Sent to Dr.Tabori.

## 2023-10-27 NOTE — Telephone Encounter (Signed)
Noted.  Please forward to [facility] for chart to be updated

## 2023-11-05 ENCOUNTER — Encounter (HOSPITAL_COMMUNITY): Payer: Medicare Other | Admitting: Speech Pathology

## 2023-11-05 ENCOUNTER — Other Ambulatory Visit (HOSPITAL_COMMUNITY): Payer: Medicare Other

## 2023-11-05 NOTE — Telephone Encounter (Signed)
I have updated patients chart.

## 2023-11-09 DEATH — deceased
